# Patient Record
Sex: Male | Born: 1960 | Race: White | Hispanic: No | Marital: Married | Smoking: Never smoker
Health system: Southern US, Community
[De-identification: ages and names within clinical notes are randomized; demographics above are authoritative.]

## PROBLEM LIST (undated history)

## (undated) DIAGNOSIS — N183 Chronic kidney disease, stage 3 (moderate): Secondary | ICD-10-CM

## (undated) DIAGNOSIS — I255 Ischemic cardiomyopathy: Secondary | ICD-10-CM

## (undated) DIAGNOSIS — N184 Chronic kidney disease, stage 4 (severe): Secondary | ICD-10-CM

## (undated) DIAGNOSIS — E1140 Type 2 diabetes mellitus with diabetic neuropathy, unspecified: Secondary | ICD-10-CM

## (undated) DIAGNOSIS — I5042 Chronic combined systolic (congestive) and diastolic (congestive) heart failure: Secondary | ICD-10-CM

## (undated) DIAGNOSIS — M79672 Pain in left foot: Secondary | ICD-10-CM

## (undated) DIAGNOSIS — M25579 Pain in unspecified ankle and joints of unspecified foot: Secondary | ICD-10-CM

## (undated) DIAGNOSIS — E785 Hyperlipidemia, unspecified: Secondary | ICD-10-CM

## (undated) DIAGNOSIS — I2510 Atherosclerotic heart disease of native coronary artery without angina pectoris: Secondary | ICD-10-CM

## (undated) DIAGNOSIS — I10 Essential (primary) hypertension: Secondary | ICD-10-CM

## (undated) DIAGNOSIS — M79671 Pain in right foot: Secondary | ICD-10-CM

## (undated) HISTORY — DX: Pain in right foot: M79.671

## (undated) HISTORY — DX: Hyperlipidemia, unspecified: E78.5

## (undated) HISTORY — DX: Chronic combined systolic (congestive) and diastolic (congestive) heart failure: I50.42

## (undated) HISTORY — DX: Atherosclerotic heart disease of native coronary artery without angina pectoris: I25.10

## (undated) HISTORY — DX: Essential (primary) hypertension: I10

## (undated) HISTORY — DX: Pain in right foot: M79.672

## (undated) HISTORY — DX: Chronic kidney disease, stage 4 (severe): N18.4

## (undated) HISTORY — DX: Ischemic cardiomyopathy: I25.5

## (undated) HISTORY — PX: CARDIAC CATHETERIZATION: SHX172

## (undated) HISTORY — DX: Pain in unspecified ankle and joints of unspecified foot: M25.579

---

## 2000-07-15 HISTORY — PX: TIBIA FRACTURE SURGERY: SHX806

## 2005-08-09 ENCOUNTER — Emergency Department: Payer: Self-pay | Admitting: Internal Medicine

## 2005-08-09 ENCOUNTER — Other Ambulatory Visit: Payer: Self-pay

## 2007-09-20 DIAGNOSIS — I10 Essential (primary) hypertension: Secondary | ICD-10-CM | POA: Insufficient documentation

## 2012-01-14 DIAGNOSIS — M7030 Other bursitis of elbow, unspecified elbow: Secondary | ICD-10-CM | POA: Insufficient documentation

## 2012-05-18 DIAGNOSIS — F1010 Alcohol abuse, uncomplicated: Secondary | ICD-10-CM | POA: Insufficient documentation

## 2012-05-18 DIAGNOSIS — M19079 Primary osteoarthritis, unspecified ankle and foot: Secondary | ICD-10-CM | POA: Insufficient documentation

## 2013-08-27 DIAGNOSIS — E1142 Type 2 diabetes mellitus with diabetic polyneuropathy: Secondary | ICD-10-CM | POA: Insufficient documentation

## 2013-08-27 DIAGNOSIS — L97509 Non-pressure chronic ulcer of other part of unspecified foot with unspecified severity: Secondary | ICD-10-CM

## 2013-08-27 DIAGNOSIS — E11621 Type 2 diabetes mellitus with foot ulcer: Secondary | ICD-10-CM | POA: Insufficient documentation

## 2013-08-27 DIAGNOSIS — E1140 Type 2 diabetes mellitus with diabetic neuropathy, unspecified: Secondary | ICD-10-CM | POA: Insufficient documentation

## 2013-09-23 LAB — HM HIV SCREENING LAB: HM HIV Screening: NEGATIVE

## 2013-09-23 LAB — HM HEPATITIS C SCREENING LAB: HM Hepatitis Screen: NEGATIVE

## 2015-06-15 DIAGNOSIS — N184 Chronic kidney disease, stage 4 (severe): Secondary | ICD-10-CM | POA: Insufficient documentation

## 2015-06-15 DIAGNOSIS — N183 Chronic kidney disease, stage 3 unspecified: Secondary | ICD-10-CM | POA: Insufficient documentation

## 2015-08-08 ENCOUNTER — Ambulatory Visit: Payer: Self-pay | Admitting: Family Medicine

## 2015-08-23 ENCOUNTER — Ambulatory Visit: Payer: Self-pay | Admitting: Family Medicine

## 2015-08-28 ENCOUNTER — Encounter: Payer: Self-pay | Admitting: Family Medicine

## 2015-08-28 ENCOUNTER — Ambulatory Visit (INDEPENDENT_AMBULATORY_CARE_PROVIDER_SITE_OTHER): Payer: Medicaid Other | Admitting: Family Medicine

## 2015-08-28 VITALS — BP 160/98 | HR 91 | Temp 98.0°F | Resp 16 | Ht 68.0 in | Wt 194.0 lb

## 2015-08-28 DIAGNOSIS — M19072 Primary osteoarthritis, left ankle and foot: Secondary | ICD-10-CM | POA: Diagnosis not present

## 2015-08-28 DIAGNOSIS — N183 Chronic kidney disease, stage 3 unspecified: Secondary | ICD-10-CM

## 2015-08-28 DIAGNOSIS — I10 Essential (primary) hypertension: Secondary | ICD-10-CM

## 2015-08-28 DIAGNOSIS — E1142 Type 2 diabetes mellitus with diabetic polyneuropathy: Secondary | ICD-10-CM | POA: Diagnosis not present

## 2015-08-28 DIAGNOSIS — Z1211 Encounter for screening for malignant neoplasm of colon: Secondary | ICD-10-CM | POA: Diagnosis not present

## 2015-08-28 DIAGNOSIS — Z7189 Other specified counseling: Secondary | ICD-10-CM | POA: Diagnosis not present

## 2015-08-28 DIAGNOSIS — Z7689 Persons encountering health services in other specified circumstances: Secondary | ICD-10-CM

## 2015-08-28 MED ORDER — LISINOPRIL-HYDROCHLOROTHIAZIDE 20-12.5 MG PO TABS: 1.0000 | ORAL_TABLET | Freq: Every day | ORAL | Status: DC

## 2015-08-28 MED ORDER — ACETAMINOPHEN ER 650 MG PO TBCR: 650.0000 mg | EXTENDED_RELEASE_TABLET | Freq: Three times a day (TID) | ORAL | Status: AC | PRN

## 2015-08-28 MED ORDER — GABAPENTIN 300 MG PO CAPS
ORAL_CAPSULE | ORAL | Status: DC
Start: 2015-08-28 — End: 2015-11-17

## 2015-08-28 NOTE — Assessment & Plan Note (Signed)
Uncontrolled. Increase lisinopril to 20mg  daily. Continue amlodipine. Check CMET for kidney function.  Return in 2 weeks.

## 2015-08-28 NOTE — Assessment & Plan Note (Signed)
Check CMET. ACE for renal protection.

## 2015-08-28 NOTE — Patient Instructions (Signed)
BP: Take 1 tablet of lisinopril daily. We will recheck your kidney functions. Please check BP at home.  Diabetes: Keep medications the same.   Foot pain: Take 2 capsules of gabapentin in the AM and 3 capsules in the PM.  Toe Fungus: Try 50% listerine and 50% vinegar mixture. If that doesn't help, we will have you see a podiatrist.   Your goal blood pressure is 140/90 Work on low salt/sodium diet - goal <1.5gm (1,500mg ) per day. Eat a diet high in fruits/vegetables and whole grains.  Look into mediterranean and DASH diet. Goal activity is 184min/wk of moderate intensity exercise.  This can be split into 30 minute chunks.  If you are not at this level, you can start with smaller 10-15 min increments and slowly build up activity. Look at [employer].org for more resources  Please seek immediate medical attention at ER or Urgent Care if you develop: Chest pain, pressure or tightness. Shortness of breath accompanied by nausea or diaphoresis Visual changes Numbness or tingling on one side of the body Facial droop Altered mental status Or any concerning symptoms.

## 2015-08-28 NOTE — Assessment & Plan Note (Signed)
No changes to diabetes regimen. A1c due after March 1. ACE for renal protection. Increase gabapentin to 3 pills in the evening and 2 in AM for uncontrolled neuropathy.  Foot exam done today. Referred for diabetic eye exam. A1C due after march 1.

## 2015-08-28 NOTE — Assessment & Plan Note (Signed)
Recommend PRN tylenol for pain. Sparing use of aleve for pain.

## 2015-08-28 NOTE — Progress Notes (Signed)
Subjective:    Patient ID: [name], male    DOB: September 06, 1960, 55 y.o.   MRN: [id number]  HPI: [name] is a 55 y.o. male presenting on 08/28/2015 for Establish Care   HPI  Pt presents to establish care today. Previous care provider was [place]  It has been 1 month since his last PCP visit. Records from previous provider will be requested and reviewed. Current medical problems include:  Diabetes: Diagnosed several years ago. Taking 30 units of lantus nightly. [facility] BID[facility] Numbness and tingling in feet. Taking 600 gabapentin BID. Checks BG- avg 150. No visual changes, spots, or floaters. LAST a1C 06/14/2015 was 7.1% Hypertension: Diagnosed with DM. Taking 10mg  lisinopril daily- previously taking 20mg  but stopped due to kidney function by previous PCP. Taking 10mg  amlodipine daily. Took medications this morning. Tries to avoid salt. Does have an occasional beer. No chest pain or shortness of breath.  Hyperlipidemia: Diagnosed with DM. Taking pravachol once daily. No myalgias.  Arthritis: Hands, knees, ankles. Pain with overuse. Takes no medication on a regular basis.    Health maintenance:  TDAP in 2008.  No colonscopy- desires Cologuard.    Past Medical History  Diagnosis Date  . Hypertension   . Diabetes mellitus without complication ([place])   . Ankle pain   . Pain in both feet   . Hyperlipidemia    Social History   Social History  . Marital Status: Married    Spouse Name: N/A  . Number of Children: N/A  . Years of Education: N/A   Occupational History  . Not on file.   Social History Main Topics  . Smoking status: Never Smoker   . Smokeless tobacco: Not on file  . Alcohol Use: Yes  . Drug Use: No  . Sexual Activity: Not on file   Other Topics Concern  . Not on file   Social History Narrative  . No narrative on file   Family History  Problem Relation Age of Onset  . Hypertension Mother   . Heart  disease Maternal Grandfather    No current outpatient prescriptions on file prior to visit.   No current facility-administered medications on file prior to visit.    Review of Systems  Constitutional: Negative for fever and chills.  HENT: Negative.   Respiratory: Negative for chest tightness, shortness of breath and wheezing.   Cardiovascular: Negative for chest pain, palpitations and leg swelling.  Gastrointestinal: Negative for nausea, vomiting and abdominal pain.  Endocrine: Negative.   Genitourinary: Negative for dysuria, urgency, discharge, penile pain and testicular pain.  Musculoskeletal: Positive for arthralgias (knees, hands, ankles). Negative for back pain and joint swelling.  Skin: Negative.   Neurological: Positive for numbness (bilateral feet). Negative for dizziness, weakness and headaches.  Psychiatric/Behavioral: Negative for sleep disturbance and dysphoric mood.   Per HPI unless specifically indicated above     Objective:    BP 160/98 mmHg  Pulse 91  Temp(Src) 98 F (36.7 C) (Oral)  Resp 16  Ht 5\' 8"  (1.727 m)  Wt 194 lb (87.998 kg)  BMI 29.50 kg/m2  Wt Readings from Last 3 Encounters:  08/28/15 194 lb (87.998 kg)    Physical Exam  Constitutional: He is oriented to person, place, and time. He appears well-developed and well-nourished. No distress.  HENT:  Head: Normocephalic and atraumatic.  Neck: Neck supple. No thyromegaly present.  Cardiovascular: Normal rate, regular rhythm and normal heart  sounds.  Exam reveals no gallop and no friction rub.   No murmur heard. Pulmonary/Chest: Effort normal and breath sounds normal. He has no wheezes.  Abdominal: Soft. Bowel sounds are normal. He exhibits no distension. There is no tenderness. There is no rebound.  Musculoskeletal: Normal range of motion. He exhibits no edema or tenderness.  Neurological: He is alert and oriented to person, place, and time. He has normal reflexes.  Skin: Skin is warm and dry. No  rash noted. No erythema.  Psychiatric: He has a normal mood and affect. His behavior is normal. Thought content normal.   Diabetic Foot Exam - Simple   Simple Foot Form  Diabetic Foot exam was performed with the following findings:  Yes 08/28/2015  9:41 AM  Visual Inspection  See comments:  Yes  Sensation Testing  Intact to touch and monofilament testing bilaterally:  Yes  Pulse Check  Posterior Tibialis and Dorsalis pulse intact bilaterally:  Yes  Comments  Onychomycosis All toes.       No results found for this or any previous visit.    Assessment & Plan:   Problem List Items Addressed This Visit      Cardiovascular and Mediastinum   Essential (primary) hypertension    Uncontrolled. Increase lisinopril to 20mg  daily. Continue amlodipine. Check CMET for kidney function.  Return in 2 weeks.       Relevant Medications   amLODipine (NORVASC) 10 MG tablet   aspirin EC 81 MG tablet   lisinopril (PRINIVIL,ZESTRIL) 20 MG tablet   pravastatin (PRAVACHOL) 40 MG tablet   lisinopril-hydrochlorothiazide (PRINZIDE,ZESTORETIC) 20-12.5 MG tablet     Nervous and Auditory   DM type 2 with diabetic peripheral neuropathy (HCC) - Primary    No changes to diabetes regimen. A1c due after March 1. ACE for renal protection. Increase gabapentin to 3 pills in the evening and 2 in AM for uncontrolled neuropathy.  Foot exam done today. Referred for diabetic eye exam. A1C due after march 1.       Relevant Medications   aspirin EC 81 MG tablet   lisinopril (PRINIVIL,ZESTRIL) 20 MG tablet   metFORMIN (GLUCOPHAGE) 1000 MG tablet   pravastatin (PRAVACHOL) 40 MG tablet   LANTUS SOLOSTAR 100 UNIT/ML Solostar Pen   lisinopril-hydrochlorothiazide (PRINZIDE,ZESTORETIC) 20-12.5 MG tablet   gabapentin (NEURONTIN) 300 MG capsule   Other Relevant Orders   Ambulatory referral to Ophthalmology   Comprehensive metabolic panel   Lipid panel     Musculoskeletal and Integument   Osteoarthritis of ankle  and foot    Recommend PRN tylenol for pain. Sparing use of aleve for pain.       Relevant Medications   aspirin EC 81 MG tablet   acetaminophen (TYLENOL 8 HOUR) 650 MG CR tablet     Genitourinary   Chronic kidney disease (CKD), stage III (moderate)    Check CMET. ACE for renal protection.        Other Visit Diagnoses    Screening for colon cancer        Relevant Orders    Cologuard       Meds ordered this encounter  Medications  . amLODipine (NORVASC) 10 MG tablet    Sig: Take 10 mg by mouth.  [name] aspirin EC 81 MG tablet    Sig: Take 81 mg by mouth.  . DISCONTD: gabapentin (NEURONTIN) 300 MG capsule    Sig: Take 600 mg by mouth.  [name] lisinopril (PRINIVIL,ZESTRIL) 20 MG tablet    Sig: Take  10 mg by mouth.  . metFORMIN (GLUCOPHAGE) 1000 MG tablet    Sig: Take 1,000 mg by mouth.  . Insulin Pen Needle (PEN NEEDLES 31GX5/16") 31G X 8 MM MISC    Sig:   . pravastatin (PRAVACHOL) 40 MG tablet    Sig: Take 40 mg by mouth.  [name] LANTUS SOLOSTAR 100 UNIT/ML Solostar Pen    Sig: INJECT 30 UNITS UNDER THE SKIN NIGHTLY    Refill:  12  . Iron-Vitamins (GERITOL PO)    Sig: Take 1 tablet by mouth. Pt takes occasionally  . lisinopril-hydrochlorothiazide (PRINZIDE,ZESTORETIC) 20-12.5 MG tablet    Sig: Take 1 tablet by mouth daily.    Dispense:  30 tablet    Refill:  11    Order Specific Question:  Supervising Provider    Answer:  Arlis Porta [phone]  . gabapentin (NEURONTIN) 300 MG capsule    Sig: Take 2 capsules in the AM and 3 capsules in the PM.    Dispense:  150 capsule    Refill:  11    Order Specific Question:  Supervising Provider    Answer:  Arlis Porta [phone]  . acetaminophen (TYLENOL 8 HOUR) 650 MG CR tablet    Sig: Take 1 tablet (650 mg total) by mouth every 8 (eight) hours as needed for pain.    Order Specific Question:  Supervising Provider    Answer:  Arlis Porta [id number]      Follow up plan: Return in about 2 weeks (around 09/13/2015) for  Diabetes, HTN.[name]

## 2015-08-29 LAB — LIPID PANEL
CHOLESTEROL TOTAL: 219 mg/dL — AB (ref 100–199)
Chol/HDL Ratio: 4.2 ratio units (ref 0.0–5.0)
HDL: 52 mg/dL (ref >39)
LDL CALC: 107 mg/dL — AB (ref 0–99)
Triglycerides: 302 mg/dL — ABNORMAL HIGH (ref 0–149)
VLDL Cholesterol Cal: 60 mg/dL — ABNORMAL HIGH (ref 5–40)

## 2015-08-29 LAB — COMPREHENSIVE METABOLIC PANEL
ALBUMIN: 4.4 g/dL (ref 3.5–5.5)
ALK PHOS: 64 IU/L (ref 39–117)
ALT: 32 IU/L (ref 0–44)
AST: 23 IU/L (ref 0–40)
Albumin/Globulin Ratio: 1.6 (ref 1.1–2.5)
BILIRUBIN TOTAL: 0.4 mg/dL (ref 0.0–1.2)
BUN / CREAT RATIO: 15 (ref 9–20)
BUN: 22 mg/dL (ref 6–24)
CHLORIDE: 96 mmol/L (ref 96–106)
CO2: 21 mmol/L (ref 18–29)
Calcium: 9.1 mg/dL (ref 8.7–10.2)
Creatinine, Ser: 1.42 mg/dL — ABNORMAL HIGH (ref 0.76–1.27)
GFR calc Af Amer: 64 mL/min/{1.73_m2} (ref >59)
GFR calc non Af Amer: 55 mL/min/{1.73_m2} — ABNORMAL LOW (ref >59)
GLOBULIN, TOTAL: 2.8 g/dL (ref 1.5–4.5)
Glucose: 144 mg/dL — ABNORMAL HIGH (ref 65–99)
Potassium: 4.8 mmol/L (ref 3.5–5.2)
SODIUM: 135 mmol/L (ref 134–144)
Total Protein: 7.2 g/dL (ref 6.0–8.5)

## 2015-09-14 ENCOUNTER — Ambulatory Visit (INDEPENDENT_AMBULATORY_CARE_PROVIDER_SITE_OTHER): Payer: Medicaid Other | Admitting: Family Medicine

## 2015-09-14 ENCOUNTER — Encounter: Payer: Self-pay | Admitting: Family Medicine

## 2015-09-14 DIAGNOSIS — N183 Chronic kidney disease, stage 3 unspecified: Secondary | ICD-10-CM

## 2015-09-14 DIAGNOSIS — I10 Essential (primary) hypertension: Secondary | ICD-10-CM | POA: Diagnosis not present

## 2015-09-14 DIAGNOSIS — E1142 Type 2 diabetes mellitus with diabetic polyneuropathy: Secondary | ICD-10-CM

## 2015-09-14 LAB — POCT GLYCOSYLATED HEMOGLOBIN (HGB A1C): Hemoglobin A1C: 7.4

## 2015-09-14 MED ORDER — PRAVASTATIN SODIUM 80 MG PO TABS: 80.0000 mg | ORAL_TABLET | Freq: Every day | ORAL | Status: DC

## 2015-09-14 MED ORDER — CARVEDILOL 6.25 MG PO TABS: 6.2500 mg | ORAL_TABLET | Freq: Two times a day (BID) | ORAL | Status: DC

## 2015-09-14 MED ORDER — LANTUS SOLOSTAR 100 UNIT/ML ~~LOC~~ SOPN: PEN_INJECTOR | SUBCUTANEOUS | Status: DC

## 2015-09-14 MED ORDER — LISINOPRIL 20 MG PO TABS: 20.0000 mg | ORAL_TABLET | Freq: Every day | ORAL | Status: DC

## 2015-09-14 NOTE — Assessment & Plan Note (Signed)
A1c is elevated from previous. Titrate Lantus 2 units per week for FBG > 130. Reviewed A1c goals with patient. Consider adding secondary oral agent or mealtime insulin with continued A1c increase.  Continue gabapentin for neuropathy.

## 2015-09-14 NOTE — Assessment & Plan Note (Signed)
Elevated today. Remove HCTZ due to elevated Cr and reduced renal function. Lisinopril 20mg  daily. Add coreg 6.25mg  BID to help with BP. Encouraged DASH diet and checking BP at home.  Recheck 1 mos.

## 2015-09-14 NOTE — Patient Instructions (Addendum)
Please check your blood glucose once  times daily. If your glucose is < 70 mg/dl or you have symptoms of hypoglycemia dizziness, hunger, jitteriness and sweating please drink 4 oz of juice or soda.  Check blood glucose 15 minutes later. If it has not risen to >100, please seek medical attention. If > 100 please eat a snack containing protein such as peanut butter and crackers.  Check blood glucose once daily in the AM: Add 2 units of insulin per week until AM blood sugars are less than 130.    STOP lisinopril HCTZ and just take lisinopril 20mg  daily. Start Coreg 6.25 mg twice daily for blood pressure.  Your goal blood pressure is 140/90 Work on low salt/sodium diet - goal <1.5gm (1,500mg ) per day. Eat a diet high in fruits/vegetables and whole grains.  Look into mediterranean and DASH diet. Goal activity is 117min/wk of moderate intensity exercise.  This can be split into 30 minute chunks.  If you are not at this level, you can start with smaller 10-15 min increments and slowly build up activity. Look at [employer].org for more resources  Please seek immediate medical attention at ER or Urgent Care if you develop: Chest pain, pressure or tightness. Shortness of breath accompanied by nausea or diaphoresis Visual changes Numbness or tingling on one side of the body Facial droop Altered mental status Or any concerning symptoms.

## 2015-09-14 NOTE — Progress Notes (Signed)
Subjective:    Patient ID: [name], male    DOB: 09-16-1960, 55 y.o.   MRN: [id number]  HPI: [name] is a 55 y.o. male presenting on 09/14/2015 for Hyperlipidemia   HPI   Here for follow up on BP and DM.  BP - Takes amlodipine 10 and lisinopril-HCTZ 20/12.5, both daily in the morning. No HA's, CP, SOB, acute vision changes, dizziness, or lightheadedness. Does not check BP at home. Last eye doctor appointment was 2 years ago. Has some near vision problems that are chronic he says. He did take medications today.  DM - checks blood sugar twice per week in the morning before eating. Gets 150-160's. A1C 7.4. Takes Lantus 30 at bedtime and metformin BID. No numbness and tingling in his feet.   monofilament - decreased sensation plantar area.  Past Medical History  Diagnosis Date  . Hypertension   . Diabetes mellitus without complication ([place])   . Ankle pain   . Pain in both feet   . Hyperlipidemia     Current Outpatient Prescriptions on File Prior to Visit  Medication Sig  . acetaminophen (TYLENOL 8 HOUR) 650 MG CR tablet Take 1 tablet (650 mg total) by mouth every 8 (eight) hours as needed for pain.  [name] amLODipine (NORVASC) 10 MG tablet Take 10 mg by mouth.  [name] aspirin EC 81 MG tablet Take 81 mg by mouth.  . gabapentin (NEURONTIN) 300 MG capsule Take 2 capsules in the AM and 3 capsules in the PM.  . Insulin Pen Needle (PEN NEEDLES 31GX5/16") 31G X 8 MM MISC   . Iron-Vitamins (GERITOL PO) Take 1 tablet by mouth. Pt takes occasionally  . metFORMIN (GLUCOPHAGE) 1000 MG tablet Take 1,000 mg by mouth.   No current facility-administered medications on file prior to visit.    Review of Systems  Constitutional: Negative for fever, chills, diaphoresis, activity change, appetite change and fatigue.  HENT: Negative for congestion, ear discharge, ear pain, hearing loss, postnasal drip, rhinorrhea, sinus pressure, sore throat and trouble swallowing.   Eyes: Negative for  photophobia, pain, discharge and visual disturbance.  Respiratory: Negative for cough, chest tightness and shortness of breath.   Cardiovascular: Negative for chest pain and palpitations.  Gastrointestinal: Negative for nausea, vomiting, abdominal pain and diarrhea.  Genitourinary: Negative for dysuria, frequency, decreased urine volume and difficulty urinating.  Musculoskeletal: Negative for myalgias, back pain, joint swelling, arthralgias and gait problem.  Skin: Negative for color change.  Neurological: Negative for dizziness, weakness, light-headedness and headaches.  Psychiatric/Behavioral: Negative for behavioral problems, sleep disturbance and agitation. The patient is not nervous/anxious.    Per HPI unless specifically indicated above     Objective:    BP 158/76 mmHg  Pulse 77  Temp(Src) 97.8 F (36.6 C) (Oral)  Resp 16  Ht 5\' 8"  (1.727 m)  Wt 195 lb (88.451 kg)  BMI 29.66 kg/m2  Wt Readings from Last 3 Encounters:  09/14/15 195 lb (88.451 kg)  08/28/15 194 lb (87.998 kg)    Physical Exam  Constitutional: He is oriented to person, place, and time. He appears well-developed and well-nourished. No distress.  HENT:  Head: Normocephalic.  Eyes: Conjunctivae and lids are normal.  Fundoscopic exam:      The right eye shows red reflex.       The left eye shows red reflex.  Neck: Normal range of motion. Neck supple. Carotid bruit is not present. No thyromegaly present.  Cardiovascular: Normal rate, regular rhythm, normal  heart sounds and intact distal pulses.  Exam reveals no gallop.   No murmur heard. Pulmonary/Chest: Effort normal and breath sounds normal. No respiratory distress. He has no wheezes.  Abdominal: Soft. Bowel sounds are normal. There is no tenderness. There is no rebound.  Musculoskeletal: Normal range of motion. He exhibits no edema or tenderness.  Lymphadenopathy:    He has no cervical adenopathy.  Neurological: He is alert and oriented to person, place,  and time.  Skin: Skin is warm and dry. He is not diaphoretic.  Psychiatric: He has a normal mood and affect. His behavior is normal. Thought content normal.   Diabetic Foot Exam - Simple   Simple Foot Form  Diabetic Foot exam was performed with the following findings:  Yes 09/14/2015  9:03 AM  Visual Inspection  No deformities, no ulcerations, no other skin breakdown bilaterally:  Yes  Sensation Testing  See comments:  Yes  Pulse Check  Posterior Tibialis and Dorsalis pulse intact bilaterally:  Yes  Comments  Decreased sensation to monofilament on plantar surface.       Results for orders placed or performed in visit on 09/14/15  POCT HgB A1C  Result Value Ref Range   Hemoglobin A1C 7.4       Assessment & Plan:   Problem List Items Addressed This Visit      Cardiovascular and Mediastinum   Essential (primary) hypertension    Elevated today. Remove HCTZ due to elevated Cr and reduced renal function. Lisinopril 20mg  daily. Add coreg 6.25mg  BID to help with BP. Encouraged DASH diet and checking BP at home.  Recheck 1 mos.       Relevant Medications   lisinopril (PRINIVIL,ZESTRIL) 20 MG tablet   carvedilol (COREG) 6.25 MG tablet   pravastatin (PRAVACHOL) 80 MG tablet     Nervous and Auditory   DM type 2 with diabetic peripheral neuropathy (HCC)    A1c is elevated from previous. Titrate Lantus 2 units per week for FBG > 130. Reviewed A1c goals with patient. Consider adding secondary oral agent or mealtime insulin with continued A1c increase.  Continue gabapentin for neuropathy.       Relevant Medications   LANTUS SOLOSTAR 100 UNIT/ML Solostar Pen   lisinopril (PRINIVIL,ZESTRIL) 20 MG tablet   pravastatin (PRAVACHOL) 80 MG tablet   Other Relevant Orders   POCT HgB A1C (Completed)     Genitourinary   Chronic kidney disease (CKD), stage III (moderate)    Stop HCTZ due to Cr bump. Check BMP next visit.          Meds ordered this encounter  Medications  . LANTUS  SOLOSTAR 100 UNIT/ML Solostar Pen    Sig: Inject 32 units under the skin bedtime daily. Increase by 2 units per week until fasting blood sugars are <130.    Dispense:  15 mL    Refill:  12    Order Specific Question:  Supervising Provider    Answer:  Arlis Porta [phone]  . lisinopril (PRINIVIL,ZESTRIL) 20 MG tablet    Sig: Take 1 tablet (20 mg total) by mouth daily.    Dispense:  90 tablet    Refill:  3    Order Specific Question:  Supervising Provider    Answer:  Arlis Porta [phone]  . carvedilol (COREG) 6.25 MG tablet    Sig: Take 1 tablet (6.25 mg total) by mouth 2 (two) times daily with a meal.    Dispense:  60 tablet  Refill:  3    Order Specific Question:  Supervising Provider    Answer:  Arlis Porta [id number]  . pravastatin (PRAVACHOL) 80 MG tablet    Sig: Take 1 tablet (80 mg total) by mouth daily.    Dispense:  90 tablet    Refill:  3    Order Specific Question:  Supervising Provider    Answer:  Arlis Porta [id number]      Follow up plan: Return in about 4 weeks (around 10/12/2015) for blood sugar check, htn.

## 2015-09-14 NOTE — Assessment & Plan Note (Signed)
Stop HCTZ due to Cr bump. Check BMP next visit.

## 2015-10-20 ENCOUNTER — Ambulatory Visit (INDEPENDENT_AMBULATORY_CARE_PROVIDER_SITE_OTHER): Payer: Medicaid Other | Admitting: Family Medicine

## 2015-10-20 ENCOUNTER — Encounter: Payer: Self-pay | Admitting: Family Medicine

## 2015-10-20 VITALS — BP 156/88 | HR 77 | Temp 98.0°F | Resp 16 | Ht 68.0 in | Wt 205.8 lb

## 2015-10-20 DIAGNOSIS — I10 Essential (primary) hypertension: Secondary | ICD-10-CM

## 2015-10-20 DIAGNOSIS — E1142 Type 2 diabetes mellitus with diabetic polyneuropathy: Secondary | ICD-10-CM | POA: Diagnosis not present

## 2015-10-20 DIAGNOSIS — N183 Chronic kidney disease, stage 3 unspecified: Secondary | ICD-10-CM

## 2015-10-20 LAB — POCT UA - MICROALBUMIN
Albumin/Creatinine Ratio, Urine, POC: 0
Creatinine, POC: 0 mg/dL
Microalbumin Ur, POC: 100 mg/L

## 2015-10-20 MED ORDER — CARVEDILOL 12.5 MG PO TABS: 12.5000 mg | ORAL_TABLET | Freq: Two times a day (BID) | ORAL | Status: DC

## 2015-10-20 NOTE — Assessment & Plan Note (Signed)
Elevated today. Pending kidney function we will increase carvedilol to 12.5mg  BID. Encouraged pt to check at home. Alarm symptoms reviewed.  Recheck 1 mos.

## 2015-10-20 NOTE — Patient Instructions (Signed)
Your goal blood pressure is 140/90. Work on low salt/sodium diet - goal <1.5gm (1,500mg ) per day. Eat a diet high in fruits/vegetables and whole grains.  Look into mediterranean and DASH diet. Goal activity is 176min/wk of moderate intensity exercise.  This can be split into 30 minute chunks.  If you are not at this level, you can start with smaller 10-15 min increments and slowly build up activity. Look at [employer].org for more resources  Carvedilol can sometimes cause your HR to be low. If you feel dizzy- count your pulse if less than <65- DO not take your second dose of carvedilol.

## 2015-10-20 NOTE — Assessment & Plan Note (Signed)
Recheck BMP to determine kidney function. Avoid NSAIDS.

## 2015-10-20 NOTE — Progress Notes (Signed)
Subjective:    Patient ID: [name], male    DOB: 04-05-1961, 55 y.o.   MRN: [id number]  HPI: [name] is a 55 y.o. male presenting on 10/20/2015 for Diabetes   HPI  Pt presents for recheck of diabetes and HTN.  Diabetes: Titration lantus- now at 34 units daily.  Sugar this AM was 99. Avg sugars were 130 in the AM. Only checks fasting sugars in the AM. No hypoglycemia. HTN: Started carvedilol at last visit. Taking twice daily. In addition to lisinopril 20mg  daily, amlodipine 10mg  daily. Does not check BP at home. No CP, SOB, dizziness, or HA.  He does have CKD with elevated Cr. We were changing his BP medications based on his Cr.   Past Medical History  Diagnosis Date  . Hypertension   . Diabetes mellitus without complication ([place])   . Ankle pain   . Pain in both feet   . Hyperlipidemia     Current Outpatient Prescriptions on File Prior to Visit  Medication Sig  . acetaminophen (TYLENOL 8 HOUR) 650 MG CR tablet Take 1 tablet (650 mg total) by mouth every 8 (eight) hours as needed for pain.  [name] amLODipine (NORVASC) 10 MG tablet Take 10 mg by mouth.  [name] aspirin EC 81 MG tablet Take 81 mg by mouth.  . gabapentin (NEURONTIN) 300 MG capsule Take 2 capsules in the AM and 3 capsules in the PM.  . Insulin Pen Needle (PEN NEEDLES 31GX5/16") 31G X 8 MM MISC   . Iron-Vitamins (GERITOL PO) Take 1 tablet by mouth. Pt takes occasionally  . LANTUS SOLOSTAR 100 UNIT/ML Solostar Pen Inject 32 units under the skin bedtime daily. Increase by 2 units per week until fasting blood sugars are <130.  [name] lisinopril (PRINIVIL,ZESTRIL) 20 MG tablet Take 1 tablet (20 mg total) by mouth daily.  . metFORMIN (GLUCOPHAGE) 1000 MG tablet Take 1,000 mg by mouth.  . pravastatin (PRAVACHOL) 80 MG tablet Take 1 tablet (80 mg total) by mouth daily.   No current facility-administered medications on file prior to visit.    Review of Systems  Constitutional: Negative for fever and chills.  HENT:  Negative.   Respiratory: Negative for chest tightness, shortness of breath and wheezing.   Cardiovascular: Negative for chest pain, palpitations and leg swelling.  Gastrointestinal: Negative for nausea, vomiting and abdominal pain.  Endocrine: Negative.   Genitourinary: Negative for dysuria, urgency, discharge, penile pain and testicular pain.  Musculoskeletal: Negative for back pain, joint swelling and arthralgias.  Skin: Negative.   Neurological: Positive for numbness (paresthesias and numbness in feet due to DM). Negative for dizziness, weakness and headaches.  Psychiatric/Behavioral: Negative for sleep disturbance and dysphoric mood.   Per HPI unless specifically indicated above     Objective:    BP 156/88 mmHg  Pulse 77  Temp(Src) 98 F (36.7 C) (Oral)  Resp 16  Ht 5\' 8"  (1.727 m)  Wt 205 lb 12.8 oz (93.35 kg)  BMI 31.30 kg/m2  Wt Readings from Last 3 Encounters:  10/20/15 205 lb 12.8 oz (93.35 kg)  09/14/15 195 lb (88.451 kg)  08/28/15 194 lb (87.998 kg)    Physical Exam  Constitutional: He is oriented to person, place, and time. He appears well-developed and well-nourished. No distress.  HENT:  Head: Normocephalic and atraumatic.  Neck: Neck supple. No thyromegaly present.  Cardiovascular: Normal rate, regular rhythm and normal heart sounds.  Exam reveals no gallop and no friction rub.   No murmur  heard. Pulmonary/Chest: Effort normal and breath sounds normal. He has no wheezes.  Abdominal: Soft. Bowel sounds are normal. He exhibits no distension. There is no tenderness. There is no rebound.  Musculoskeletal: Normal range of motion. He exhibits no edema or tenderness.  Neurological: He is alert and oriented to person, place, and time. He has normal reflexes.  Skin: Skin is warm and dry. No rash noted. No erythema.  Psychiatric: He has a normal mood and affect. His behavior is normal. Thought content normal.   Results for orders placed or performed in visit on  09/14/15  POCT HgB A1C  Result Value Ref Range   Hemoglobin A1C 7.4       Assessment & Plan:   Problem List Items Addressed This Visit      Cardiovascular and Mediastinum   Essential (primary) hypertension    Elevated today. Pending kidney function we will increase carvedilol to 12.5mg  BID. Encouraged pt to check at home. Alarm symptoms reviewed.  Recheck 1 mos.       Relevant Medications   carvedilol (COREG) 12.5 MG tablet     Nervous and Auditory   DM type 2 with diabetic peripheral neuropathy (HCC)    Doing well titrating insulin. Sugars are well controlled. Encouraged pt to increase to 36 units if AM sugar is > 125 on average in 1 week.  UA micro done today. Foot exam UTD. Eye exam UTD.       Relevant Orders   POCT UA - Microalbumin     Genitourinary   Chronic kidney disease (CKD), stage III (moderate) - Primary    Recheck BMP to determine kidney function. Avoid NSAIDS.       Relevant Orders   Basic Metabolic Panel (BMET)      Meds ordered this encounter  Medications  . carvedilol (COREG) 12.5 MG tablet    Sig: Take 1 tablet (12.5 mg total) by mouth 2 (two) times daily with a meal.    Dispense:  60 tablet    Refill:  3    Order Specific Question:  Supervising Provider    Answer:  Arlis Porta [id number]      Follow up plan: Return in about 4 weeks (around 11/17/2015) for BP check. [name]

## 2015-10-20 NOTE — Assessment & Plan Note (Signed)
Doing well titrating insulin. Sugars are well controlled. Encouraged pt to increase to 36 units if AM sugar is > 125 on average in 1 week.  UA micro done today. Foot exam UTD. Eye exam UTD.

## 2015-10-27 LAB — BASIC METABOLIC PANEL
BUN/Creatinine Ratio: 12 (ref 9–20)
BUN: 13 mg/dL (ref 6–24)
CALCIUM: 9.3 mg/dL (ref 8.7–10.2)
CHLORIDE: 97 mmol/L (ref 96–106)
CO2: 24 mmol/L (ref 18–29)
Creatinine, Ser: 1.12 mg/dL (ref 0.76–1.27)
GFR calc Af Amer: 84 mL/min/{1.73_m2} (ref >59)
GFR calc non Af Amer: 73 mL/min/{1.73_m2} (ref >59)
GLUCOSE: 165 mg/dL — AB (ref 65–99)
POTASSIUM: 4.3 mmol/L (ref 3.5–5.2)
SODIUM: 137 mmol/L (ref 134–144)

## 2015-11-03 LAB — HM DIABETES EYE EXAM

## 2015-11-08 ENCOUNTER — Encounter: Payer: Self-pay | Admitting: Family Medicine

## 2015-11-17 ENCOUNTER — Ambulatory Visit (INDEPENDENT_AMBULATORY_CARE_PROVIDER_SITE_OTHER): Payer: Medicaid Other | Admitting: Family Medicine

## 2015-11-17 VITALS — BP 146/78 | HR 66 | Temp 98.0°F | Resp 16 | Ht 68.0 in | Wt 202.6 lb

## 2015-11-17 DIAGNOSIS — I10 Essential (primary) hypertension: Secondary | ICD-10-CM

## 2015-11-17 DIAGNOSIS — E1142 Type 2 diabetes mellitus with diabetic polyneuropathy: Secondary | ICD-10-CM

## 2015-11-17 DIAGNOSIS — M79674 Pain in right toe(s): Secondary | ICD-10-CM

## 2015-11-17 DIAGNOSIS — M79675 Pain in left toe(s): Secondary | ICD-10-CM

## 2015-11-17 MED ORDER — GABAPENTIN 300 MG PO CAPS: ORAL_CAPSULE | ORAL | Status: DC

## 2015-11-17 MED ORDER — NAPROXEN 375 MG PO TABS: 375.0000 mg | ORAL_TABLET | Freq: Two times a day (BID) | ORAL | Status: DC

## 2015-11-17 MED ORDER — LISINOPRIL 40 MG PO TABS: 40.0000 mg | ORAL_TABLET | Freq: Every day | ORAL | Status: DC

## 2015-11-17 MED ORDER — CAPSAICIN 0.025 % EX CREA: TOPICAL_CREAM | Freq: Two times a day (BID) | CUTANEOUS | Status: DC

## 2015-11-17 NOTE — Assessment & Plan Note (Signed)
Increase lisinopril to 40mg  daily. Continue other medications. Consider adding low dose diuretic now that kidney function is back to normal. Encouraged Dash diet.  Recheck 1 mos.

## 2015-11-17 NOTE — Patient Instructions (Signed)
Blood pressure: Increase to lisinopril 40mg  daily. You can take 2 pills of the 20mg  tablets until the bottle is empty.   Your goal blood pressure is 140/90. Work on low salt/sodium diet - goal <1.5gm (1,500mg ) per day. Eat a diet high in fruits/vegetables and whole grains.  Look into mediterranean and DASH diet. Goal activity is 162min/wk of moderate intensity exercise.  This can be split into 30 minute chunks.  If you are not at this level, you can start with smaller 10-15 min increments and slowly build up activity. Look at [email or web address] for more resources   Foot pain: I think it is related to the nerve pain you have from diabetes. We will increase your Gabapentin to 3 capsules in the AM and 3 capsules in the PM. Try adding capsaicin cream applied to feet twice daily. We will also try a little bit of naproxen twice daily for 10 days to see if that helps.   Please seek immediate medical attention at ER or Urgent Care if you develop: Chest pain, pressure or tightness. Shortness of breath accompanied by nausea or diaphoresis Visual changes Numbness or tingling on one side of the body Facial droop Altered mental status Or any concerning symptoms.

## 2015-11-17 NOTE — Assessment & Plan Note (Signed)
Neuropathy vs PVD or mixture of both as cause of pain. Trial of increasing gabapentin. Adding topical capsaicin cream twice daily for pain. Consider adding cymbalta for further pain control if not successful.

## 2015-11-17 NOTE — Progress Notes (Signed)
Subjective:    Patient ID: [name], male    DOB: 01/29/1961, 55 y.o.   MRN: [id number]  HPI: [name] is a 55 y.o. male presenting on 11/17/2015 for Hypertension   HPI  Pt presents for blood pressure follow-up. BP is elevated today. Has been elevated at home. Reports he is pain due to his toes. Toes are red and tingling. Gabapentin is not helping with toe pain. Hurt worse at night.  BP: Carevedilol BID, amlodipine, and lisinopril 20mg . Checking at home. Still high. No HA. No CP. No SOB.  Pt recently had his diabetic eye exam. No retinopathy noted.   Past Medical History  Diagnosis Date  . Hypertension   . Diabetes mellitus without complication ([place])   . Ankle pain   . Pain in both feet   . Hyperlipidemia     Current Outpatient Prescriptions on File Prior to Visit  Medication Sig  . acetaminophen (TYLENOL 8 HOUR) 650 MG CR tablet Take 1 tablet (650 mg total) by mouth every 8 (eight) hours as needed for pain.  [name] amLODipine (NORVASC) 10 MG tablet Take 10 mg by mouth.  [name] aspirin EC 81 MG tablet Take 81 mg by mouth.  . carvedilol (COREG) 12.5 MG tablet Take 1 tablet (12.5 mg total) by mouth 2 (two) times daily with a meal.  . Insulin Pen Needle (PEN NEEDLES 31GX5/16") 31G X 8 MM MISC   . Iron-Vitamins (GERITOL PO) Take 1 tablet by mouth. Pt takes occasionally  . LANTUS SOLOSTAR 100 UNIT/ML Solostar Pen Inject 32 units under the skin bedtime daily. Increase by 2 units per week until fasting blood sugars are <130.  . metFORMIN (GLUCOPHAGE) 1000 MG tablet Take 1,000 mg by mouth.  . pravastatin (PRAVACHOL) 80 MG tablet Take 1 tablet (80 mg total) by mouth daily.   No current facility-administered medications on file prior to visit.    Review of Systems  Constitutional: Negative for fever and chills.  Eyes: Negative for photophobia and visual disturbance.  Respiratory: Negative for chest tightness, shortness of breath and wheezing.   Cardiovascular: Negative for chest  pain, palpitations and leg swelling.  Gastrointestinal: Negative.   Endocrine: Negative for cold intolerance, heat intolerance, polydipsia, polyphagia and polyuria.  Musculoskeletal:       Toe pain and stinging.   Skin: Positive for color change (redness toes).  Neurological: Positive for numbness. Negative for light-headedness and headaches.  Psychiatric/Behavioral: Negative.    Per HPI unless specifically indicated above     Objective:    BP 146/78 mmHg  Pulse 66  Temp(Src) 98 F (36.7 C) (Oral)  Resp 16  Ht 5\' 8"  (1.727 m)  Wt 202 lb 9.6 oz (91.899 kg)  BMI 30.81 kg/m2  Wt Readings from Last 3 Encounters:  11/17/15 202 lb 9.6 oz (91.899 kg)  10/20/15 205 lb 12.8 oz (93.35 kg)  09/14/15 195 lb (88.451 kg)    Physical Exam  Constitutional: He is oriented to person, place, and time. He appears well-developed and well-nourished. No distress.  HENT:  Head: Normocephalic and atraumatic.  Neck: Neck supple. No thyromegaly present.  Cardiovascular: Normal rate, regular rhythm and normal heart sounds.  Exam reveals no gallop and no friction rub.   No murmur heard. Pulmonary/Chest: Effort normal and breath sounds normal. He has no wheezes.  Abdominal: Soft. Bowel sounds are normal. He exhibits no distension. There is no tenderness. There is no rebound.  Musculoskeletal: Normal range of motion. He exhibits no edema  or tenderness.       Right foot: There is decreased capillary refill (2-3 seconds). There is normal range of motion, no tenderness and no bony tenderness.       Left foot: There is decreased capillary refill (2-3 seconds.). There is normal range of motion, no tenderness, no bony tenderness and no swelling.  Neurological: He is alert and oriented to person, place, and time. He has normal reflexes.  Skin: Skin is warm, dry and intact. No rash noted. There is erythema (mild redness toes).  Yellow rigid toe nails  Psychiatric: He has a normal mood and affect. His behavior is  normal. Thought content normal.   Diabetic Foot Exam - Simple   Simple Foot Form  Diabetic Foot exam was performed with the following findings:  Yes 11/17/2015  8:15 AM  Visual Inspection  See comments:  Yes  Sensation Testing  See comments:  Yes  Pulse Check  Posterior Tibialis and Dorsalis pulse intact bilaterally:  Yes  Comments  Yellow rigid nails. Sensation decreased to monofilament bilateral plantar surface.        Results for orders placed or performed in visit on 11/08/15  HM DIABETES EYE EXAM  Result Value Ref Range   HM Diabetic Eye Exam Retinopathy (A) No Retinopathy      Assessment & Plan:   Problem List Items Addressed This Visit      Cardiovascular and Mediastinum   Essential (primary) hypertension - Primary    Increase lisinopril to 40mg  daily. Continue other medications. Consider adding low dose diuretic now that kidney function is back to normal. Encouraged Dash diet.  Recheck 1 mos.       Relevant Medications   lisinopril (PRINIVIL,ZESTRIL) 40 MG tablet     Nervous and Auditory   DM type 2 with diabetic peripheral neuropathy (HCC)    Neuropathy vs PVD or mixture of both as cause of pain. Trial of increasing gabapentin. Adding topical capsaicin cream twice daily for pain. Consider adding cymbalta for further pain control if not successful.       Relevant Medications   lisinopril (PRINIVIL,ZESTRIL) 40 MG tablet   gabapentin (NEURONTIN) 300 MG capsule   capsaicin (ZOSTRIX) 0.025 % cream    Other Visit Diagnoses    Toe pain, bilateral        Relevant Medications    naproxen (NAPROSYN) 375 MG tablet       Meds ordered this encounter  Medications  . lisinopril (PRINIVIL,ZESTRIL) 40 MG tablet    Sig: Take 1 tablet (40 mg total) by mouth daily.    Dispense:  30 tablet    Refill:  11    Order Specific Question:  Supervising Provider    Answer:  Arlis Porta [phone]  . gabapentin (NEURONTIN) 300 MG capsule    Sig: Take 3 capsules in the AM and  3 capsules in the PM.    Dispense:  150 capsule    Refill:  11    Order Specific Question:  Supervising Provider    Answer:  Arlis Porta [phone]  . capsaicin (ZOSTRIX) 0.025 % cream    Sig: Apply topically 2 (two) times daily.    Dispense:  60 g    Refill:  0    Order Specific Question:  Supervising Provider    Answer:  Arlis Porta [id number]  . naproxen (NAPROSYN) 375 MG tablet    Sig: Take 1 tablet (375 mg total) by mouth 2 (two) times daily  with a meal.    Dispense:  20 tablet    Refill:  1    Order Specific Question:  Supervising Provider    Answer:  Arlis Porta [id number]      Follow up plan: Return in about 1 month (around 12/18/2015) for Diabetes. [name]

## 2015-12-21 ENCOUNTER — Ambulatory Visit: Payer: Medicaid Other | Admitting: Family Medicine

## 2016-01-02 ENCOUNTER — Other Ambulatory Visit: Payer: Self-pay | Admitting: Family Medicine

## 2016-01-22 ENCOUNTER — Ambulatory Visit (INDEPENDENT_AMBULATORY_CARE_PROVIDER_SITE_OTHER): Payer: Medicaid Other | Admitting: Family Medicine

## 2016-01-22 VITALS — BP 164/92 | HR 78 | Temp 98.1°F | Resp 16 | Ht 68.0 in | Wt 202.0 lb

## 2016-01-22 DIAGNOSIS — I10 Essential (primary) hypertension: Secondary | ICD-10-CM

## 2016-01-22 DIAGNOSIS — E1169 Type 2 diabetes mellitus with other specified complication: Secondary | ICD-10-CM | POA: Insufficient documentation

## 2016-01-22 DIAGNOSIS — E1142 Type 2 diabetes mellitus with diabetic polyneuropathy: Secondary | ICD-10-CM | POA: Diagnosis not present

## 2016-01-22 DIAGNOSIS — N183 Chronic kidney disease, stage 3 unspecified: Secondary | ICD-10-CM

## 2016-01-22 DIAGNOSIS — E785 Hyperlipidemia, unspecified: Secondary | ICD-10-CM | POA: Insufficient documentation

## 2016-01-22 LAB — POCT GLYCOSYLATED HEMOGLOBIN (HGB A1C): HEMOGLOBIN A1C: 6.6

## 2016-01-22 MED ORDER — HYDROCHLOROTHIAZIDE 12.5 MG PO TABS: 12.5000 mg | ORAL_TABLET | Freq: Every day | ORAL | Status: DC

## 2016-01-22 MED ORDER — LANTUS SOLOSTAR 100 UNIT/ML ~~LOC~~ SOPN: PEN_INJECTOR | SUBCUTANEOUS | Status: DC

## 2016-01-22 MED ORDER — CARVEDILOL 12.5 MG PO TABS: 12.5000 mg | ORAL_TABLET | Freq: Two times a day (BID) | ORAL | Status: DC

## 2016-01-22 MED ORDER — METFORMIN HCL 1000 MG PO TABS: 1000.0000 mg | ORAL_TABLET | Freq: Two times a day (BID) | ORAL | Status: DC

## 2016-01-22 MED ORDER — ATORVASTATIN CALCIUM 20 MG PO TABS: 20.0000 mg | ORAL_TABLET | Freq: Every day | ORAL | Status: DC

## 2016-01-22 NOTE — Assessment & Plan Note (Signed)
Elevated today. Pt reports he is taking medications as prescribed. Given good kidney function- will add low dose HCTZ to current regimen. Consider hydralazine if uncontrolled in future. Recheck 2-3 weeks and will check BMP at that time.

## 2016-01-22 NOTE — Assessment & Plan Note (Signed)
Last GFR and Cr WNL. Will check BMP next visit at the addition of HCTZ

## 2016-01-22 NOTE — Patient Instructions (Addendum)
Diabetes: Keep up the good work! Your A1c is doing great!  Blood pressure: Add Hydrochlorothiazide pill to your current blood pressure regimen. Take once daily. This will make you pee- so take in the morning. Stay very hydrated! Cholesterol: We are changing you to a new pill called Atorvastatin. Stop taking pravastatin to help control your cholesterol.   Your goal blood pressure is 140/90. Work on low salt/sodium diet - goal <1.5gm (1,500mg ) per day. Eat a diet high in fruits/vegetables and whole grains.  Look into mediterranean and DASH diet. Goal activity is 13min/wk of moderate intensity exercise.  This can be split into 30 minute chunks.  If you are not at this level, you can start with smaller 10-15 min increments and slowly build up activity. Look at [email or web address] for more resources  Please seek immediate medical attention at ER or Urgent Care if you develop: Chest pain, pressure or tightness. Shortness of breath accompanied by nausea or diaphoresis Visual changes Numbness or tingling on one side of the body Facial droop Altered mental status Or any concerning symptoms.

## 2016-01-22 NOTE — Assessment & Plan Note (Signed)
Not at goal. Change to high intensity statin.

## 2016-01-22 NOTE — Assessment & Plan Note (Signed)
Much improved with increased lantus. Will continue current regimen. Recheck UA microalbumin next visits. Eye exam UTD. Foot exam UTD. RTC 3 mos.

## 2016-01-22 NOTE — Progress Notes (Signed)
Subjective:    Patient ID: [name], male    DOB: Jun 23, 1961, 55 y.o.   MRN: [id number]  HPI: [name] is a 55 y.o. male presenting on 01/22/2016 for Hypertension   HPI  Pt presents for BP and diabetes follow-up. Diabetes: A1c is down to 6.6% from 7.4% at last visit. Pt is out of metformin. Taking 36 total units of lantus daily. Checking sugars- avg 140-144. No numbness or tingling in feet- neurontin is helping. Eye exam- 11/06/2015- no retinopathy. Exercise- walks daily. Works in garden.  High cholesterol: Taking pravastain. Still not at goal. No other statins. No myalgias.  HTN: High today. Does take at home. Has taking all medications this morning. No CP. No dizziness. No shortness of breath.   Past Medical History  Diagnosis Date  . Hypertension   . Diabetes mellitus without complication ([place])   . Ankle pain   . Pain in both feet   . Hyperlipidemia     Current Outpatient Prescriptions on File Prior to Visit  Medication Sig  . acetaminophen (TYLENOL 8 HOUR) 650 MG CR tablet Take 1 tablet (650 mg total) by mouth every 8 (eight) hours as needed for pain.  [name] amLODipine (NORVASC) 10 MG tablet Take 10 mg by mouth.  [name] aspirin EC 81 MG tablet Take 81 mg by mouth.  . capsaicin (ZOSTRIX) 0.025 % cream Apply topically 2 (two) times daily.  [name] gabapentin (NEURONTIN) 300 MG capsule Take 3 capsules in the AM and 3 capsules in the PM.  . Insulin Pen Needle (PEN NEEDLES 31GX5/16") 31G X 8 MM MISC   . Iron-Vitamins (GERITOL PO) Take 1 tablet by mouth. Pt takes occasionally  . lisinopril (PRINIVIL,ZESTRIL) 40 MG tablet Take 1 tablet (40 mg total) by mouth daily.  . naproxen (NAPROSYN) 375 MG tablet TAKE 1 TABLET (375 MG TOTAL) BY MOUTH 2 (TWO) TIMES DAILY WITH A MEAL.   No current facility-administered medications on file prior to visit.    Review of Systems  Constitutional: Negative for fever and chills.  HENT: Negative.   Respiratory: Negative for chest tightness,  shortness of breath and wheezing.   Cardiovascular: Negative for chest pain, palpitations and leg swelling.  Gastrointestinal: Negative for nausea, vomiting and abdominal pain.  Endocrine: Negative.   Genitourinary: Negative for dysuria, urgency, discharge, penile pain and testicular pain.  Musculoskeletal: Negative for back pain, joint swelling and arthralgias.  Skin: Negative.   Neurological: Negative for dizziness, weakness, numbness and headaches.  Psychiatric/Behavioral: Negative for sleep disturbance and dysphoric mood.   Per HPI unless specifically indicated above     Objective:    BP 164/92 mmHg  Pulse 78  Temp(Src) 98.1 F (36.7 C) (Oral)  Resp 16  Ht 5\' 8"  (1.727 m)  Wt 202 lb (91.627 kg)  BMI 30.72 kg/m2  Wt Readings from Last 3 Encounters:  01/22/16 202 lb (91.627 kg)  11/17/15 202 lb 9.6 oz (91.899 kg)  10/20/15 205 lb 12.8 oz (93.35 kg)    Physical Exam  Constitutional: He is oriented to person, place, and time. He appears well-developed and well-nourished. No distress.  HENT:  Head: Normocephalic and atraumatic.  Neck: Neck supple. No thyromegaly present.  Cardiovascular: Normal rate, regular rhythm and normal heart sounds.  Exam reveals no gallop and no friction rub.   No murmur heard. Pulmonary/Chest: Effort normal and breath sounds normal. He has no wheezes.  Abdominal: Soft. Bowel sounds are normal. He exhibits no distension. There is no tenderness. There  is no rebound.  Musculoskeletal: Normal range of motion. He exhibits no edema or tenderness.  Neurological: He is alert and oriented to person, place, and time. He has normal reflexes.  Skin: Skin is warm and dry. No rash noted. No erythema.  Psychiatric: He has a normal mood and affect. His behavior is normal. Thought content normal.   Results for orders placed or performed in visit on 01/22/16  POCT HgB A1C  Result Value Ref Range   Hemoglobin A1C 6.6       Assessment & Plan:   Problem List  Items Addressed This Visit      Cardiovascular and Mediastinum   Essential (primary) hypertension    Elevated today. Pt reports he is taking medications as prescribed. Given good kidney function- will add low dose HCTZ to current regimen. Consider hydralazine if uncontrolled in future. Recheck 2-3 weeks and will check BMP at that time.       Relevant Medications   carvedilol (COREG) 12.5 MG tablet   hydrochlorothiazide (HYDRODIURIL) 12.5 MG tablet   atorvastatin (LIPITOR) 20 MG tablet     Nervous and Auditory   DM type 2 with diabetic peripheral neuropathy ([place]) - Primary    Much improved with increased lantus. Will continue current regimen. Recheck UA microalbumin next visits. Eye exam UTD. Foot exam UTD. RTC 3 mos.       Relevant Medications   metFORMIN (GLUCOPHAGE) 1000 MG tablet   LANTUS SOLOSTAR 100 UNIT/ML Solostar Pen   atorvastatin (LIPITOR) 20 MG tablet   Other Relevant Orders   POCT HgB A1C (Completed)     Genitourinary   Chronic kidney disease (CKD), stage III (moderate)    Last GFR and Cr WNL. Will check BMP next visit at the addition of HCTZ        Other   Hyperlipidemia    Not at goal. Change to high intensity statin.       Relevant Medications   carvedilol (COREG) 12.5 MG tablet   hydrochlorothiazide (HYDRODIURIL) 12.5 MG tablet   atorvastatin (LIPITOR) 20 MG tablet      Meds ordered this encounter  Medications  . metFORMIN (GLUCOPHAGE) 1000 MG tablet    Sig: Take 1 tablet (1,000 mg total) by mouth 2 (two) times daily with a meal.    Dispense:  180 tablet    Refill:  3    Order Specific Question:  Supervising Provider    Answer:  Arlis Porta [phone]  . LANTUS SOLOSTAR 100 UNIT/ML Solostar Pen    Sig: Inject 36 units under the skin bedtime daily. Increase by 2 units per week until fasting blood sugars are <130.    Dispense:  15 mL    Refill:  12    Order Specific Question:  Supervising Provider    Answer:  Arlis Porta [phone]  .  carvedilol (COREG) 12.5 MG tablet    Sig: Take 1 tablet (12.5 mg total) by mouth 2 (two) times daily with a meal.    Dispense:  180 tablet    Refill:  3    Order Specific Question:  Supervising Provider    Answer:  Arlis Porta [phone]  . hydrochlorothiazide (HYDRODIURIL) 12.5 MG tablet    Sig: Take 1 tablet (12.5 mg total) by mouth daily.    Dispense:  30 tablet    Refill:  11    Order Specific Question:  Supervising Provider    Answer:  Arlis Porta [phone]  .  atorvastatin (LIPITOR) 20 MG tablet    Sig: Take 1 tablet (20 mg total) by mouth daily.    Dispense:  90 tablet    Refill:  3    Order Specific Question:  Supervising Provider    Answer:  Arlis Porta [id number]      Follow up plan: Return in about 3 weeks (around 02/12/2016), or if symptoms worsen or fail to improve, for HTN.

## 2016-02-12 ENCOUNTER — Ambulatory Visit (INDEPENDENT_AMBULATORY_CARE_PROVIDER_SITE_OTHER): Payer: Medicaid Other | Admitting: Family Medicine

## 2016-02-12 ENCOUNTER — Encounter: Payer: Self-pay | Admitting: Family Medicine

## 2016-02-12 VITALS — BP 152/84 | HR 70 | Temp 97.9°F | Resp 16 | Ht 68.0 in | Wt 204.8 lb

## 2016-02-12 DIAGNOSIS — I10 Essential (primary) hypertension: Secondary | ICD-10-CM

## 2016-02-12 DIAGNOSIS — E1142 Type 2 diabetes mellitus with diabetic polyneuropathy: Secondary | ICD-10-CM | POA: Diagnosis not present

## 2016-02-12 LAB — BASIC METABOLIC PANEL WITH GFR
BUN: 18 mg/dL (ref 7–25)
CHLORIDE: 103 mmol/L (ref 98–110)
CO2: 26 mmol/L (ref 20–31)
CREATININE: 1.21 mg/dL (ref 0.70–1.33)
Calcium: 8.9 mg/dL (ref 8.6–10.3)
GFR, Est African American: 77 mL/min (ref >=60)
GFR, Est Non African American: 67 mL/min (ref >=60)
Glucose, Bld: 152 mg/dL — ABNORMAL HIGH (ref 65–99)
POTASSIUM: 4.4 mmol/L (ref 3.5–5.3)
Sodium: 134 mmol/L — ABNORMAL LOW (ref 135–146)

## 2016-02-12 MED ORDER — BLOOD PRESSURE CUFF MISC: 1.0000 | Freq: Every day | 0 refills | Status: DC

## 2016-02-12 MED ORDER — LANTUS SOLOSTAR 100 UNIT/ML ~~LOC~~ SOPN: PEN_INJECTOR | SUBCUTANEOUS | 12 refills | Status: DC

## 2016-02-12 NOTE — Progress Notes (Signed)
Subjective:    Patient ID: [name], male    DOB: Oct 11, 1960, 55 y.o.   MRN: [id number]  HPI: [name] is a 55 y.o. male presenting on 02/12/2016 for Hypertension   HPI  Pt presents for hypertension follow-up. BP has been hard to control. Today still mildly elevated. Not checking BP at home.  Pt needs refills of lantus.   Past Medical History:  Diagnosis Date  . Ankle pain   . Diabetes mellitus without complication ([place])   . Hyperlipidemia   . Hypertension   . Pain in both feet     Current Outpatient Prescriptions on File Prior to Visit  Medication Sig  . acetaminophen (TYLENOL 8 HOUR) 650 MG CR tablet Take 1 tablet (650 mg total) by mouth every 8 (eight) hours as needed for pain.  [name] amLODipine (NORVASC) 10 MG tablet Take 10 mg by mouth.  [name] aspirin EC 81 MG tablet Take 81 mg by mouth.  [name] atorvastatin (LIPITOR) 20 MG tablet Take 1 tablet (20 mg total) by mouth daily.  . capsaicin (ZOSTRIX) 0.025 % cream Apply topically 2 (two) times daily.  . carvedilol (COREG) 12.5 MG tablet Take 1 tablet (12.5 mg total) by mouth 2 (two) times daily with a meal.  . gabapentin (NEURONTIN) 300 MG capsule Take 3 capsules in the AM and 3 capsules in the PM.  . hydrochlorothiazide (HYDRODIURIL) 12.5 MG tablet Take 1 tablet (12.5 mg total) by mouth daily.  . Insulin Pen Needle (PEN NEEDLES 31GX5/16") 31G X 8 MM MISC   . Iron-Vitamins (GERITOL PO) Take 1 tablet by mouth. Pt takes occasionally  . lisinopril (PRINIVIL,ZESTRIL) 40 MG tablet Take 1 tablet (40 mg total) by mouth daily.  . metFORMIN (GLUCOPHAGE) 1000 MG tablet Take 1 tablet (1,000 mg total) by mouth 2 (two) times daily with a meal.  . naproxen (NAPROSYN) 375 MG tablet TAKE 1 TABLET (375 MG TOTAL) BY MOUTH 2 (TWO) TIMES DAILY WITH A MEAL.   No current facility-administered medications on file prior to visit.     Review of Systems  Constitutional: Negative for chills and fever.  HENT: Negative.   Respiratory: Negative  for chest tightness, shortness of breath and wheezing.   Cardiovascular: Negative for chest pain, palpitations and leg swelling.  Gastrointestinal: Negative for abdominal pain, nausea and vomiting.  Endocrine: Negative.   Genitourinary: Negative for discharge, dysuria, penile pain, testicular pain and urgency.  Musculoskeletal: Negative for arthralgias, back pain and joint swelling.  Skin: Negative.   Neurological: Negative for dizziness, weakness, numbness and headaches.  Psychiatric/Behavioral: Negative for dysphoric mood and sleep disturbance.   Per HPI unless specifically indicated above     Objective:    BP (!) 152/84 (BP Location: Left Arm)   Pulse 70   Temp 97.9 F (36.6 C) (Oral)   Resp 16   Ht 5\' 8"  (1.727 m)   Wt 204 lb 12.8 oz (92.9 kg)   BMI 31.14 kg/m   Wt Readings from Last 3 Encounters:  02/12/16 204 lb 12.8 oz (92.9 kg)  01/22/16 202 lb (91.6 kg)  11/17/15 202 lb 9.6 oz (91.9 kg)    Physical Exam  Constitutional: He is oriented to person, place, and time. He appears well-developed and well-nourished. No distress.  HENT:  Head: Normocephalic and atraumatic.  Neck: Neck supple. No thyromegaly present.  Cardiovascular: Normal rate, regular rhythm and normal heart sounds.  Exam reveals no gallop and no friction rub.   No murmur heard. Pulmonary/Chest:  Effort normal and breath sounds normal. He has no wheezes.  Abdominal: Soft. Bowel sounds are normal. He exhibits no distension. There is no tenderness. There is no rebound.  Musculoskeletal: Normal range of motion. He exhibits no edema or tenderness.  Neurological: He is alert and oriented to person, place, and time. He has normal reflexes.  Skin: Skin is warm and dry. No rash noted. No erythema.  Psychiatric: He has a normal mood and affect. His behavior is normal. Thought content normal.   Results for orders placed or performed in visit on 01/22/16  POCT HgB A1C  Result Value Ref Range   Hemoglobin A1C 6.6         Assessment & Plan:   Problem List Items Addressed This Visit      Cardiovascular and Mediastinum   Essential (primary) hypertension - Primary    Check BMP today. Tentatively increase HCTZ to 25mg  daily pending kidney function. IF incr Cr will plan to increase the carvedilol to 25mg  BID.  BP cuff prescription given so patient can monitor at home.  Consider hydralazine if not improving.        Relevant Medications   Blood Pressure Monitoring (BLOOD PRESSURE CUFF) MISC   Other Relevant Orders   BASIC METABOLIC PANEL WITH GFR     Nervous and Auditory   DM type 2 with diabetic peripheral neuropathy (HCC)    Renewed lantus.       Relevant Medications   LANTUS SOLOSTAR 100 UNIT/ML Solostar Pen    Other Visit Diagnoses   None.     Meds ordered this encounter  Medications  . LANTUS SOLOSTAR 100 UNIT/ML Solostar Pen    Sig: Inject 36 units under the skin bedtime daily. Increase by 2 units per week until fasting blood sugars are <130.    Dispense:  15 mL    Refill:  12    Order Specific Question:   Supervising Provider    Answer:   Arlis Porta [phone]  . Blood Pressure Monitoring (BLOOD PRESSURE CUFF) MISC    Sig: 1 each by Does not apply route daily.    Dispense:  1 each    Refill:  0    Order Specific Question:   Supervising Provider    Answer:   Arlis Porta [id number]      Follow up plan: Return in about 1 month (around 03/14/2016), or if symptoms worsen or fail to improve, for BP check. [name]

## 2016-02-12 NOTE — Assessment & Plan Note (Signed)
Renewed lantus.

## 2016-02-12 NOTE — Patient Instructions (Addendum)
We will check your kidney function today. For now take 2 of the hydrochlorothiazide once daily.   Please get a blood pressure cuff so we can check your blood pressure at home. Your goal blood pressure is 140/90 Work on low salt/sodium diet - goal <1.5gm (1,500mg ) per day. Eat a diet high in fruits/vegetables and whole grains.  Look into mediterranean and DASH diet. Goal activity is 151min/wk of moderate intensity exercise.  This can be split into 30 minute chunks.  If you are not at this level, you can start with smaller 10-15 min increments and slowly build up activity. Look at [place].org for more resources  Please seek immediate medical attention at ER or Urgent Care if you develop: Chest pain, pressure or tightness. Shortness of breath accompanied by nausea or diaphoresis Visual changes Numbness or tingling on one side of the body Facial droop Altered mental status Or any concerning symptoms.

## 2016-02-12 NOTE — Assessment & Plan Note (Signed)
Check BMP today. Tentatively increase HCTZ to 25mg  daily pending kidney function. IF incr Cr will plan to increase the carvedilol to 25mg  BID.  BP cuff prescription given so patient can monitor at home.  Consider hydralazine if not improving.

## 2016-03-14 ENCOUNTER — Ambulatory Visit (INDEPENDENT_AMBULATORY_CARE_PROVIDER_SITE_OTHER): Payer: Medicaid Other | Admitting: Family Medicine

## 2016-03-14 ENCOUNTER — Encounter: Payer: Self-pay | Admitting: Family Medicine

## 2016-03-14 VITALS — BP 144/82 | HR 66 | Temp 98.2°F | Resp 16 | Ht 68.0 in | Wt 203.6 lb

## 2016-03-14 DIAGNOSIS — E1142 Type 2 diabetes mellitus with diabetic polyneuropathy: Secondary | ICD-10-CM

## 2016-03-14 DIAGNOSIS — I10 Essential (primary) hypertension: Secondary | ICD-10-CM | POA: Diagnosis not present

## 2016-03-14 MED ORDER — DULOXETINE HCL 60 MG PO CPEP: 60.0000 mg | ORAL_CAPSULE | Freq: Every day | ORAL | 11 refills | Status: DC

## 2016-03-14 NOTE — Assessment & Plan Note (Signed)
Unclear if previous medication changes were successful since pt is out of medications. No medication changes today. Pt to return next week for nurse BP check. Encouraged pt to check BP at home or drug store.  Recheck 1 weeks.

## 2016-03-14 NOTE — Assessment & Plan Note (Signed)
Add Cymbalta to help control painful diabetic neuropathy. Continue gabapentin.  Capsaicin cream PRN.  Recheck A1c 6 weeks.

## 2016-03-14 NOTE — Progress Notes (Signed)
Subjective:    Patient ID: [name], male    DOB: Jul 05, 1961, 55 y.o.   MRN: [id number]  HPI: [name] is a 55 y.o. male presenting on 03/14/2016 for Hypertension and Diabetes (home BS around 140)   HPI  Pt presents for hypertension follow-up.  His BP is elevated but he states he has been out of his medications for 3 days.  He is out of lisinopril and HCTZ. At home he is just taking carvedilol and amlodipine. Was not checking blood pressures at home. At home was taking 2 HCTZ daily.  Diabetic neuropathy- taking 3 AM and 4 at night. Hurts at night the worst. Gabapentin is no longer helping Has tried capsaicin cream- without relief.   Past Medical History:  Diagnosis Date  . Ankle pain   . Diabetes mellitus without complication ([place])   . Hyperlipidemia   . Hypertension   . Pain in both feet     Current Outpatient Prescriptions on File Prior to Visit  Medication Sig  . acetaminophen (TYLENOL 8 HOUR) 650 MG CR tablet Take 1 tablet (650 mg total) by mouth every 8 (eight) hours as needed for pain.  [name] amLODipine (NORVASC) 10 MG tablet Take 10 mg by mouth.  [name] aspirin EC 81 MG tablet Take 81 mg by mouth.  [name] atorvastatin (LIPITOR) 20 MG tablet Take 1 tablet (20 mg total) by mouth daily.  . Blood Pressure Monitoring (BLOOD PRESSURE CUFF) MISC 1 each by Does not apply route daily.  . capsaicin (ZOSTRIX) 0.025 % cream Apply topically 2 (two) times daily.  . carvedilol (COREG) 12.5 MG tablet Take 1 tablet (12.5 mg total) by mouth 2 (two) times daily with a meal.  . gabapentin (NEURONTIN) 300 MG capsule Take 3 capsules in the AM and 3 capsules in the PM.  . hydrochlorothiazide (HYDRODIURIL) 12.5 MG tablet Take 1 tablet (12.5 mg total) by mouth daily.  . Insulin Pen Needle (PEN NEEDLES 31GX5/16") 31G X 8 MM MISC   . Iron-Vitamins (GERITOL PO) Take 1 tablet by mouth. Pt takes occasionally  . LANTUS SOLOSTAR 100 UNIT/ML Solostar Pen Inject 36 units under the skin bedtime daily.  Increase by 2 units per week until fasting blood sugars are <130.  [name] lisinopril (PRINIVIL,ZESTRIL) 40 MG tablet Take 1 tablet (40 mg total) by mouth daily.  . metFORMIN (GLUCOPHAGE) 1000 MG tablet Take 1 tablet (1,000 mg total) by mouth 2 (two) times daily with a meal.   No current facility-administered medications on file prior to visit.     Review of Systems  Constitutional: Negative for chills and fever.  HENT: Negative.   Respiratory: Negative for chest tightness, shortness of breath and wheezing.   Cardiovascular: Negative for chest pain, palpitations and leg swelling.  Gastrointestinal: Negative for abdominal pain, nausea and vomiting.  Endocrine: Negative.   Genitourinary: Negative for discharge, dysuria, penile pain, testicular pain and urgency.  Musculoskeletal: Negative for arthralgias, back pain and joint swelling.  Skin: Negative.   Neurological: Negative for dizziness, weakness, numbness and headaches.       Paresthesias feet  Psychiatric/Behavioral: Negative for dysphoric mood and sleep disturbance.   Per HPI unless specifically indicated above     Objective:    BP (!) 144/82 (BP Location: Right Arm, Cuff Size: Large)   Pulse 66   Temp 98.2 F (36.8 C) (Oral)   Resp 16   Ht 5\' 8"  (1.727 m)   Wt 203 lb 9.6 oz (92.4 kg)  BMI 30.96 kg/m   Wt Readings from Last 3 Encounters:  03/14/16 203 lb 9.6 oz (92.4 kg)  02/12/16 204 lb 12.8 oz (92.9 kg)  01/22/16 202 lb (91.6 kg)    Physical Exam  Constitutional: He is oriented to person, place, and time. He appears well-developed and well-nourished. No distress.  HENT:  Head: Normocephalic and atraumatic.  Neck: Neck supple. No thyromegaly present.  Cardiovascular: Normal rate, regular rhythm and normal heart sounds.  Exam reveals no gallop and no friction rub.   No murmur heard. Pulmonary/Chest: Effort normal and breath sounds normal. He has no wheezes.  Abdominal: Soft. Bowel sounds are normal. He exhibits no  distension. There is no tenderness. There is no rebound.  Musculoskeletal: Normal range of motion. He exhibits no edema or tenderness.  Neurological: He is alert and oriented to person, place, and time. He has normal reflexes.  Skin: Skin is warm and dry. No rash noted. No erythema.  Psychiatric: He has a normal mood and affect. His behavior is normal. Thought content normal.   Results for orders placed or performed in visit on [id number]  BASIC METABOLIC PANEL WITH GFR  Result Value Ref Range   Sodium 134 (L) 135 - 146 mmol/L   Potassium 4.4 3.5 - 5.3 mmol/L   Chloride 103 98 - 110 mmol/L   CO2 26 20 - 31 mmol/L   Glucose, Bld 152 (H) 65 - 99 mg/dL   BUN 18 7 - 25 mg/dL   Creat 1.21 0.70 - 1.33 mg/dL   Calcium 8.9 8.6 - 10.3 mg/dL   GFR, Est African American 77 >=60 mL/min   GFR, Est Non African American 67 >=60 mL/min      Assessment & Plan:   Problem List Items Addressed This Visit      Cardiovascular and Mediastinum   Essential (primary) hypertension - Primary    Unclear if previous medication changes were successful since pt is out of medications. No medication changes today. Pt to return next week for nurse BP check. Encouraged pt to check BP at home or drug store.  Recheck 1 weeks.         Nervous and Auditory   DM type 2 with diabetic peripheral neuropathy (HCC)    Add Cymbalta to help control painful diabetic neuropathy. Continue gabapentin.  Capsaicin cream PRN.  Recheck A1c 6 weeks.       Relevant Medications   DULoxetine (CYMBALTA) 60 MG capsule    Other Visit Diagnoses   None.     Meds ordered this encounter  Medications  . DULoxetine (CYMBALTA) 60 MG capsule    Sig: Take 1 capsule (60 mg total) by mouth daily.    Dispense:  30 capsule    Refill:  11    Order Specific Question:   Supervising Provider    Answer:   Arlis Porta [phone]      Follow up plan: Return in about 6 weeks (around 04/25/2016), or if symptoms worsen or fail to improve,  for Diabetes and 1 week for Nurse BP check.

## 2016-03-14 NOTE — Patient Instructions (Signed)
Please pick up your medications.  Check blood pressure when you are at the drug store.  Your goal blood pressure is 140/90 Work on low salt/sodium diet - goal <1.5gm (1,500mg ) per day. Eat a diet high in fruits/vegetables and whole grains.  Look into mediterranean and DASH diet. Goal activity is 187min/wk of moderate intensity exercise.  This can be split into 30 minute chunks.  If you are not at this level, you can start with smaller 10-15 min increments and slowly build up activity. Look at [employer].org for more resources   Please come back sometime next week for a nurse visit to get your blood pressure checked. Make sure you have taken your medications at least 1 hour before.

## 2016-03-21 ENCOUNTER — Ambulatory Visit: Payer: Medicaid Other

## 2016-03-21 VITALS — BP 146/98 | HR 83

## 2016-03-21 DIAGNOSIS — I10 Essential (primary) hypertension: Secondary | ICD-10-CM

## 2016-03-21 NOTE — Progress Notes (Signed)
Pt was here to check blood pressure as per Amy.

## 2016-04-24 ENCOUNTER — Ambulatory Visit (INDEPENDENT_AMBULATORY_CARE_PROVIDER_SITE_OTHER): Payer: Medicaid Other | Admitting: Family Medicine

## 2016-04-24 ENCOUNTER — Encounter: Payer: Self-pay | Admitting: Family Medicine

## 2016-04-24 VITALS — BP 150/82 | HR 65 | Temp 98.0°F | Resp 16 | Ht 68.0 in | Wt 205.0 lb

## 2016-04-24 DIAGNOSIS — E1142 Type 2 diabetes mellitus with diabetic polyneuropathy: Secondary | ICD-10-CM | POA: Diagnosis not present

## 2016-04-24 DIAGNOSIS — I10 Essential (primary) hypertension: Secondary | ICD-10-CM

## 2016-04-24 DIAGNOSIS — M10072 Idiopathic gout, left ankle and foot: Secondary | ICD-10-CM | POA: Diagnosis not present

## 2016-04-24 LAB — POCT GLYCOSYLATED HEMOGLOBIN (HGB A1C): HEMOGLOBIN A1C: 7.5

## 2016-04-24 MED ORDER — ALPHA-LIPOIC ACID 600 MG PO CAPS: 1.0000 | ORAL_CAPSULE | Freq: Every day | ORAL | 11 refills | Status: DC

## 2016-04-24 NOTE — Assessment & Plan Note (Signed)
Check kidney function today and renin-aldosterone levels. Pending kidney function will either change change diurectic to chlorthalidone or plan on adding hydralazine to current regimen for better control. Consider vascular referral for work-up renal artery stenosis if still continues to be hypertensive.  Alarm symptoms reviewed. Recheck 1 mos.

## 2016-04-24 NOTE — Assessment & Plan Note (Addendum)
Marked increase in A1c- pt reports he has not been adhering to the diabetic diet. Discussed going to nutrition for a refresher on the diabetic diet. Encouraged pt to limit high carb foods such as beans and corn. Encouraged fresh vegetable or frozen vegetables as sides for meals. Avoid sugar sweetened beverages. Plan to add 2 units insulin every 7 days for fasting blood sugar >130mg /dl.  Sugar check 1 mos.

## 2016-04-24 NOTE — Patient Instructions (Signed)
Please call the [facility] to set up a refresher diabetes course: [phone]   Blood pressure: We will check some labs today to determine which medication to use. I will contact you tomorrow to let you know which medication to add or change.   Foot pain_ Let's try adding alpha lipoic acid to see if that helps with your symptoms.

## 2016-04-24 NOTE — Progress Notes (Signed)
Subjective:    Patient ID: [name], male    DOB: 08/19/1960, 55 y.o.   MRN: [id number]  HPI: [name] is a 55 y.o. male presenting on 04/24/2016 for Hypertension   HPI  Pt presents for follow-up of blood pressure. It remains elevated. No chest pain. No SOB, dizziness, or visual changes. Taking Carvedilol twice daily, Amlodipine 10mg , Lisinopril 40mg  and HCTZ 12.5mg  once daily.  Trial Cymbalta for peripheral neuropathy. He could not tolerate the medication afte 2 doses. It put him in "[place]" At times the feet are very painful and uncomfortable. States it feels at times like gout. Toes feel like they are going to "blow up" get red and swollen. Mainly at night.  Due for diabetes follow-up today. A1c has increased from 6.6% to 7.5%. Taking 1000mg  of metformin and 36 units of lantus once daily. Avg fasting sugars are 150 in the AM. Pt reports his diet has changed. He is snacking at night.  Still walking daily. Typical meals include- eats lots of hamburgers. Eats lot of beans. Tries to avoid potatoes. Eats lots of corn.  Declines flu shot today.   Past Medical History:  Diagnosis Date  . Ankle pain   . Diabetes mellitus without complication ([place])   . Hyperlipidemia   . Hypertension   . Pain in both feet     Current Outpatient Prescriptions on File Prior to Visit  Medication Sig  . acetaminophen (TYLENOL 8 HOUR) 650 MG CR tablet Take 1 tablet (650 mg total) by mouth every 8 (eight) hours as needed for pain.  [name] amLODipine (NORVASC) 10 MG tablet Take 10 mg by mouth.  [name] aspirin EC 81 MG tablet Take 81 mg by mouth.  [name] atorvastatin (LIPITOR) 20 MG tablet Take 1 tablet (20 mg total) by mouth daily.  . Blood Pressure Monitoring (BLOOD PRESSURE CUFF) MISC 1 each by Does not apply route daily.  . capsaicin (ZOSTRIX) 0.025 % cream Apply topically 2 (two) times daily.  . carvedilol (COREG) 12.5 MG tablet Take 1 tablet (12.5 mg total) by mouth 2 (two) times daily with a meal.  .  DULoxetine (CYMBALTA) 60 MG capsule Take 1 capsule (60 mg total) by mouth daily.  [name] gabapentin (NEURONTIN) 300 MG capsule Take 3 capsules in the AM and 3 capsules in the PM.  . hydrochlorothiazide (HYDRODIURIL) 12.5 MG tablet Take 1 tablet (12.5 mg total) by mouth daily.  . Insulin Pen Needle (PEN NEEDLES 31GX5/16") 31G X 8 MM MISC   . Iron-Vitamins (GERITOL PO) Take 1 tablet by mouth. Pt takes occasionally  . LANTUS SOLOSTAR 100 UNIT/ML Solostar Pen Inject 36 units under the skin bedtime daily. Increase by 2 units per week until fasting blood sugars are <130.  [name] lisinopril (PRINIVIL,ZESTRIL) 40 MG tablet Take 1 tablet (40 mg total) by mouth daily.  . metFORMIN (GLUCOPHAGE) 1000 MG tablet Take 1 tablet (1,000 mg total) by mouth 2 (two) times daily with a meal.   No current facility-administered medications on file prior to visit.     Review of Systems  Constitutional: Negative for chills and fever.  HENT: Negative.   Respiratory: Negative for chest tightness, shortness of breath and wheezing.   Cardiovascular: Negative for chest pain, palpitations and leg swelling.  Gastrointestinal: Negative for abdominal pain, nausea and vomiting.  Endocrine: Negative.   Genitourinary: Negative for discharge, dysuria, penile pain, testicular pain and urgency.  Musculoskeletal: Negative for arthralgias, back pain and joint swelling.  Skin: Negative.  Neurological: Positive for numbness (paresthesia feet. ). Negative for dizziness, weakness and headaches.  Psychiatric/Behavioral: Negative for dysphoric mood and sleep disturbance.   Per HPI unless specifically indicated above     Objective:    BP (!) 150/82 (BP Location: Right Arm)   Pulse 65   Temp 98 F (36.7 C) (Oral)   Resp 16   Ht 5\' 8"  (1.727 m)   Wt 205 lb (93 kg)   BMI 31.17 kg/m   Wt Readings from Last 3 Encounters:  04/24/16 205 lb (93 kg)  03/14/16 203 lb 9.6 oz (92.4 kg)  02/12/16 204 lb 12.8 oz (92.9 kg)    Physical Exam    Constitutional: He is oriented to person, place, and time. He appears well-developed and well-nourished. No distress.  HENT:  Head: Normocephalic and atraumatic.  Neck: Neck supple. No thyromegaly present.  Cardiovascular: Normal rate, regular rhythm and normal heart sounds.  Exam reveals no gallop and no friction rub.   No murmur heard. Pulmonary/Chest: Effort normal and breath sounds normal. He has no wheezes.  Abdominal: Soft. Bowel sounds are normal. He exhibits no distension. There is no tenderness. There is no rebound.  Musculoskeletal: Normal range of motion. He exhibits no edema or tenderness.  Neurological: He is alert and oriented to person, place, and time. He has normal reflexes.  Skin: Skin is warm and dry. No rash noted. No erythema.  Psychiatric: He has a normal mood and affect. His behavior is normal. Thought content normal.   Diabetic Foot Exam - Simple   Simple Foot Form Diabetic Foot exam was performed with the following findings:  Yes 04/24/2016 10:28 AM  Visual Inspection No deformities, no ulcerations, no other skin breakdown bilaterally:  Yes Sensation Testing See comments:  Yes Pulse Check Posterior Tibialis and Dorsalis pulse intact bilaterally:  Yes Comments Decreased sensation to monofilament 2-4th toes bilaterally.      Results for orders placed or performed in visit on 04/24/16  POCT HgB A1C  Result Value Ref Range   Hemoglobin A1C 7.5       Assessment & Plan:   Problem List Items Addressed This Visit      Cardiovascular and Mediastinum   Resistant hypertension    Check kidney function today and renin-aldosterone levels. Pending kidney function will either change change diurectic to chlorthalidone or plan on adding hydralazine to current regimen for better control. Consider vascular referral for work-up renal artery stenosis if still continues to be hypertensive.  Alarm symptoms reviewed. Recheck 1 mos.       Relevant Orders   Aldosterone +  renin activity w/ ratio   BASIC METABOLIC PANEL WITH GFR     Endocrine   DM type 2 with diabetic peripheral neuropathy (HCC)    Marked increase in A1c- pt reports he has not been adhering to the diabetic diet. Discussed going to nutrition for a refresher on the diabetic diet. Encouraged pt to limit high carb foods such as beans and corn. Encouraged fresh vegetable or frozen vegetables as sides for meals. Avoid sugar sweetened beverages. Plan to add 2 units insulin every 7 days for fasting blood sugar >130mg /dl.  Sugar check 1 mos.       Relevant Medications   Alpha-Lipoic Acid 600 MG CAPS   Other Relevant Orders   POCT HgB A1C (Completed)   Ambulatory referral to diabetic education    Other Visit Diagnoses    Acute idiopathic gout involving toe of left foot    -  Primary   Will check uric acid levels today due report of symptoms being like gout. However more likely symptoms are peripheral neuropathy related. If uric acid elevated-   Relevant Orders   Uric acid      Meds ordered this encounter  Medications  . Alpha-Lipoic Acid 600 MG CAPS    Sig: Take 1 capsule (600 mg total) by mouth daily.    Dispense:  30 each    Refill:  11    Order Specific Question:   Supervising Provider    Answer:   Arlis Porta [phone]      Follow up plan: Return in about 4 weeks (around 05/22/2016), or if symptoms worsen or fail to improve, for Blood pressure check. [name]

## 2016-04-25 ENCOUNTER — Telehealth: Payer: Self-pay | Admitting: Family Medicine

## 2016-04-25 DIAGNOSIS — I10 Essential (primary) hypertension: Secondary | ICD-10-CM

## 2016-04-25 DIAGNOSIS — E875 Hyperkalemia: Secondary | ICD-10-CM

## 2016-04-25 LAB — BASIC METABOLIC PANEL WITH GFR
BUN: 31 mg/dL — AB (ref 7–25)
CHLORIDE: 101 mmol/L (ref 98–110)
CO2: 24 mmol/L (ref 20–31)
Calcium: 9.6 mg/dL (ref 8.6–10.3)
Creat: 1.42 mg/dL — ABNORMAL HIGH (ref 0.70–1.33)
GFR, EST NON AFRICAN AMERICAN: 55 mL/min — AB (ref >=60)
GFR, Est African American: 63 mL/min (ref >=60)
Glucose, Bld: 164 mg/dL — ABNORMAL HIGH (ref 65–99)
POTASSIUM: 6.2 mmol/L — AB (ref 3.5–5.3)
Sodium: 133 mmol/L — ABNORMAL LOW (ref 135–146)

## 2016-04-25 LAB — URIC ACID: Uric Acid, Serum: 5.6 mg/dL (ref 4.0–8.0)

## 2016-04-25 MED ORDER — FUROSEMIDE 20 MG PO TABS: ORAL_TABLET | ORAL | 0 refills | Status: DC

## 2016-04-25 MED ORDER — HYDRALAZINE HCL 25 MG PO TABS: 25.0000 mg | ORAL_TABLET | Freq: Two times a day (BID) | ORAL | 2 refills | Status: DC

## 2016-04-25 NOTE — Telephone Encounter (Signed)
See previously opened telephone note.

## 2016-04-25 NOTE — Telephone Encounter (Signed)
Attempted to call patient regarding his elevated potassium level today of 6.2 today. His daughter in law has the phone- she will give it back and have him call us.  His potassium level is very high. I would like him to take Lasix 40mg  today and 20mg  daily for 3 days. STOP his lisinopril and his HCTZ today.  He needs to come in for lab work tomorrow morning to recheck his potassium level.  We will start hydralazine 25mg  twice daily at this time. I would like him to see Dr. Raliegh Ip back in 2 weeks.

## 2016-04-25 NOTE — Telephone Encounter (Signed)
Spoke with the wife. They will pick up lasix and he will have stat labs drawn tomorrow.

## 2016-04-25 NOTE — Telephone Encounter (Signed)
Called pt. LMTCB 

## 2016-04-25 NOTE — Telephone Encounter (Signed)
Pt. Wife return your call  Her  Call back # is  [phone]

## 2016-04-26 ENCOUNTER — Other Ambulatory Visit: Payer: Self-pay | Admitting: Family Medicine

## 2016-04-26 ENCOUNTER — Telehealth: Payer: Self-pay | Admitting: Family Medicine

## 2016-04-26 ENCOUNTER — Other Ambulatory Visit: Payer: Medicaid Other

## 2016-04-26 ENCOUNTER — Other Ambulatory Visit: Payer: Self-pay

## 2016-04-26 DIAGNOSIS — E875 Hyperkalemia: Secondary | ICD-10-CM

## 2016-04-26 LAB — BASIC METABOLIC PANEL WITH GFR
BUN: 33 mg/dL — AB (ref 7–25)
CALCIUM: 9.8 mg/dL (ref 8.6–10.3)
CHLORIDE: 100 mmol/L (ref 98–110)
CO2: 26 mmol/L (ref 20–31)
CREATININE: 1.84 mg/dL — AB (ref 0.70–1.33)
GFR, Est African American: 46 mL/min — ABNORMAL LOW (ref >=60)
GFR, Est Non African American: 40 mL/min — ABNORMAL LOW (ref >=60)
GLUCOSE: 140 mg/dL — AB (ref 65–99)
Potassium: 5.3 mmol/L (ref 3.5–5.3)
SODIUM: 135 mmol/L (ref 135–146)

## 2016-04-26 NOTE — Progress Notes (Signed)
Medication changes for decreased kidney function.

## 2016-04-26 NOTE — Telephone Encounter (Signed)
Called pt and wife to review labs. Potassium is back to normal but his kidney function has worsened slightly. Take lasix today only. Continue not to take Lisinopril 40mg  and Hydrochlorathiazide 12.mg once daily. Take instead Hydralazine 25mg  twice daily.  Please follow-up with Dr. Raliegh Ip in 2 weeks.

## 2016-04-30 LAB — ALDOSTERONE + RENIN ACTIVITY W/ RATIO
ALDO / PRA RATIO: 0.4 ratio — AB (ref 0.9–28.9)
ALDOSTERONE: 4 ng/dL
PRA LC/MS/MS: 11.25 ng/mL/h — ABNORMAL HIGH (ref 0.25–5.82)

## 2016-05-03 ENCOUNTER — Other Ambulatory Visit: Payer: Self-pay | Admitting: Family Medicine

## 2016-05-03 DIAGNOSIS — N183 Chronic kidney disease, stage 3 unspecified: Secondary | ICD-10-CM

## 2016-05-03 DIAGNOSIS — R7989 Other specified abnormal findings of blood chemistry: Secondary | ICD-10-CM

## 2016-05-03 DIAGNOSIS — I10 Essential (primary) hypertension: Secondary | ICD-10-CM

## 2016-05-03 DIAGNOSIS — R799 Abnormal finding of blood chemistry, unspecified: Secondary | ICD-10-CM

## 2016-05-22 ENCOUNTER — Ambulatory Visit (INDEPENDENT_AMBULATORY_CARE_PROVIDER_SITE_OTHER): Payer: Medicaid Other | Admitting: Family Medicine

## 2016-05-22 ENCOUNTER — Encounter: Payer: Self-pay | Admitting: Family Medicine

## 2016-05-22 VITALS — BP 164/84 | HR 67 | Temp 97.9°F | Resp 16 | Ht 68.0 in | Wt 207.0 lb

## 2016-05-22 DIAGNOSIS — N183 Chronic kidney disease, stage 3 unspecified: Secondary | ICD-10-CM

## 2016-05-22 DIAGNOSIS — I10 Essential (primary) hypertension: Secondary | ICD-10-CM | POA: Diagnosis not present

## 2016-05-22 MED ORDER — AMLODIPINE BESYLATE 10 MG PO TABS: 10.0000 mg | ORAL_TABLET | Freq: Every day | ORAL | 5 refills | Status: DC

## 2016-05-22 MED ORDER — HYDRALAZINE HCL 25 MG PO TABS: 25.0000 mg | ORAL_TABLET | Freq: Three times a day (TID) | ORAL | 5 refills | Status: DC

## 2016-05-22 NOTE — Patient Instructions (Signed)
Thank you for coming in to clinic today.  1. BP is still elevated. - Increase Hydralazine 25mg  to THREE times a day (instead of twice a day) - Continue other 2 blood pressure pills, refills sent to pharmacy  - DO NOT RESTART Hydrochlorothiazide or Lisinopril yet, please discuss these medications with your Kidney Doctor  Recommend check BP with cuff at the pharmacy, write down your numbers, check this 1 to 2 times a week between now and next appointment  Try to quit drinking caffeine (coffee, soda, tea) this will help your BP Start more regular exercise with bike and walking every day  Also write down blood sugar readings  Please schedule a follow-up appointment with Dr. Parks Ranger in 3 months for Diabetes, A1c, BP check follow-up  If you have any other questions or concerns, please feel free to call the clinic or send a message through [place]. You may also schedule an earlier appointment if necessary.  [name], DO [place]

## 2016-05-22 NOTE — Assessment & Plan Note (Signed)
Chronic elevated Cr, gradual worsening recently with AoCKD with Cr up to 1.8 and GFF < 50 - Continue to hold HCTZ and ACEi - No NSAIDs - Improve hydration - Follow-up with Nephrology to establish for resistant HTN, adjust BP meds today

## 2016-05-22 NOTE — Progress Notes (Signed)
Subjective:    Patient ID: [name], male    DOB: 1960-10-04, 55 y.o.   MRN: [id number]  [name] is a 55 y.o. male presenting on 05/22/2016 for Hypertension   HPI  CHRONIC RESISTANT HTN / Elevated PRA Reports does not check BP at home. Has scheduled apt with Nephrology 06/24/16 to establish care due to resistant HTN. Last visit 04/2016 due to elevated Cr and abnormal PRA, he was discontinued from Lisinopril and HCTZ (also had HyperK), started on Hydralazine 25mg  BID. Last labs with improved normalized K but still worsening Cr. Current Meds - Carvedilol 12.5mg  BID, Hydralazine 25mg  BID (started 04/2016), Amlodipine 10mg  daily (he continues to HOLD HCTZ 12.5 and Lisinopril 40 due to Cr)   Reports good compliance, took meds today. Tolerating well, w/o complaints. Lifestyle - Regular exercise with walking does yardwork at home and they have a lot [place] with animals and garden, interested in riding a bike for exercise, tries to limit salt intake, tries to follow DM diet Denies CP, dyspnea, HA, edema, dizziness / lightheadedness   Social History  Substance Use Topics  . Smoking status: Never Smoker  . Smokeless tobacco: Never Used  . Alcohol use Yes    Review of Systems Per HPI unless specifically indicated above     Objective:    BP (!) 164/84 (BP Location: Left Arm, Cuff Size: Normal)   Pulse 67   Temp 97.9 F (36.6 C) (Oral)   Resp 16   Ht 5\' 8"  (1.727 m)   Wt 207 lb (93.9 kg)   BMI 31.47 kg/m   Wt Readings from Last 3 Encounters:  05/22/16 207 lb (93.9 kg)  04/24/16 205 lb (93 kg)  03/14/16 203 lb 9.6 oz (92.4 kg)    Physical Exam  Constitutional: He is oriented to person, place, and time. He appears well-developed and well-nourished. No distress.  Well-appearing, comfortable, cooperative  HENT:  Head: Normocephalic and atraumatic.  Mouth/Throat: Oropharynx is clear and moist.  Cardiovascular: Normal rate, regular rhythm, normal heart sounds  and intact distal pulses.   No murmur heard. Pulmonary/Chest: Effort normal.  Musculoskeletal: He exhibits no edema.  Neurological: He is alert and oriented to person, place, and time.  Skin: Skin is warm and dry. He is not diaphoretic.  Psychiatric: He has a normal mood and affect. His behavior is normal.  Nursing note and vitals reviewed.    I have personally reviewed the following lab results from 04/26/16.  Results for orders placed or performed in visit on 48/54/62  BASIC METABOLIC PANEL WITH GFR  Result Value Ref Range   Sodium 135 135 - 146 mmol/L   Potassium 5.3 3.5 - 5.3 mmol/L   Chloride 100 98 - 110 mmol/L   CO2 26 20 - 31 mmol/L   Glucose, Bld 140 (H) 65 - 99 mg/dL   BUN 33 (H) 7 - 25 mg/dL   Creat 1.84 (H) 0.70 - 1.33 mg/dL   Calcium 9.8 8.6 - 10.3 mg/dL   GFR, Est African American 46 (L) >=60 mL/min   GFR, Est Non African American 40 (L) >=60 mL/min      Assessment & Plan:   Problem List Items Addressed This Visit    Resistant hypertension - Primary    Uncontrolled BP, persistent resistant HTN, compared chart review prior BPs. Does not check at home. Only mild improvement on manual re-check. - Complicated with gradual worsening Cr up to 1.8, also aldosterone lab normal but elevated PRA,  concerned for secondary etiology of HTN, as patient seems adherent to medications  Plan: 1. Increase Hydralazine from 25 BID to TID, new rx sent, marked on patient's bottle 2. Continue Carvedilol 12.5mg  BID, Amlodpine 10 - refill sent - limited options, pulse in 60s, decide against increasing BB 3. Keep HOLDing HCTZ 12.5 and Lisinopril 40 given elevated Cr, awaiting Nephrology follow-up 4. Check BP at drug store, write down readings, given log 5. Low sodium DM diet, start more regular exercise with biking, stop caffeine intake 6. Follow-up 3 months BP re-check, follow-up sooner if needed, return criteria given      Relevant Medications   hydrochlorothiazide (HYDRODIURIL) 12.5  MG tablet   lisinopril (PRINIVIL,ZESTRIL) 40 MG tablet   hydrALAZINE (APRESOLINE) 25 MG tablet   amLODipine (NORVASC) 10 MG tablet   Chronic kidney disease (CKD), stage III (moderate)    Chronic elevated Cr, gradual worsening recently with AoCKD with Cr up to 1.8 and GFF < 50 - Continue to hold HCTZ and ACEi - No NSAIDs - Improve hydration - Follow-up with Nephrology to establish for resistant HTN, adjust BP meds today         Meds ordered this encounter  Medications  . DISCONTD: furosemide (LASIX) 20 MG tablet    Sig: TAKE 2 TABLETS BY MOUTH TODAY AND 1 TABLET DAILY FOR 3 DAYS.    Refill:  0  .           .           . hydrALAZINE (APRESOLINE) 25 MG tablet    Sig: Take 1 tablet (25 mg total) by mouth 3 (three) times daily.    Dispense:  90 tablet    Refill:  5  . amLODipine (NORVASC) 10 MG tablet    Sig: Take 1 tablet (10 mg total) by mouth daily.    Dispense:  30 tablet    Refill:  5      Follow up plan: Return in about 3 months (around 08/22/2016) for diabetes, blood pressure.  [name], [facility] 05/22/2016, 8:49 AM

## 2016-05-22 NOTE — Assessment & Plan Note (Signed)
Uncontrolled BP, persistent resistant HTN, compared chart review prior BPs. Does not check at home. Only mild improvement on manual re-check. - Complicated with gradual worsening Cr up to 1.8, also aldosterone lab normal but elevated PRA, concerned for secondary etiology of HTN, as patient seems adherent to medications  Plan: 1. Increase Hydralazine from 25 BID to TID, new rx sent, marked on patient's bottle 2. Continue Carvedilol 12.5mg  BID, Amlodpine 10 - refill sent - limited options, pulse in 60s, decide against increasing BB 3. Keep HOLDing HCTZ 12.5 and Lisinopril 40 given elevated Cr, awaiting Nephrology follow-up 4. Check BP at drug store, write down readings, given log 5. Low sodium DM diet, start more regular exercise with biking, stop caffeine intake 6. Follow-up 3 months BP re-check, follow-up sooner if needed, return criteria given

## 2016-07-23 ENCOUNTER — Telehealth: Payer: Self-pay | Admitting: Family Medicine

## 2016-07-23 DIAGNOSIS — E1142 Type 2 diabetes mellitus with diabetic polyneuropathy: Secondary | ICD-10-CM

## 2016-07-23 MED ORDER — GABAPENTIN 300 MG PO CAPS: ORAL_CAPSULE | ORAL | 11 refills | Status: DC

## 2016-07-23 MED ORDER — LANTUS SOLOSTAR 100 UNIT/ML ~~LOC~~ SOPN: PEN_INJECTOR | SUBCUTANEOUS | 12 refills | Status: DC

## 2016-07-23 MED ORDER — "PEN NEEDLES 5/16"" 31G X 8 MM MISC": 11 refills | Status: DC

## 2016-07-23 NOTE — Telephone Encounter (Signed)
Refills sent for Gabapentin 300mg  takes 3 in AM and 3 in PM for 6 daily total #180 for 30 day supply with +11 refills, sent insulin pen needles, to check CBG up to TID as advised, and refilled Lantus insulin pen same rx as before with refills.  [name], [facility] 07/23/2016, 12:42 PM

## 2016-07-23 NOTE — Telephone Encounter (Signed)
Pt needs refills on gabapentin and insulin pens sent to [facility] in [place].  His call back number is [phone]

## 2016-08-22 ENCOUNTER — Encounter: Payer: Self-pay | Admitting: Family Medicine

## 2016-08-22 ENCOUNTER — Ambulatory Visit (INDEPENDENT_AMBULATORY_CARE_PROVIDER_SITE_OTHER): Payer: Medicaid Other | Admitting: Family Medicine

## 2016-08-22 VITALS — BP 154/82 | HR 66 | Temp 97.9°F | Resp 16 | Ht 68.0 in | Wt 201.0 lb

## 2016-08-22 DIAGNOSIS — I10 Essential (primary) hypertension: Secondary | ICD-10-CM

## 2016-08-22 DIAGNOSIS — N183 Chronic kidney disease, stage 3 unspecified: Secondary | ICD-10-CM

## 2016-08-22 DIAGNOSIS — E1142 Type 2 diabetes mellitus with diabetic polyneuropathy: Secondary | ICD-10-CM

## 2016-08-22 LAB — BASIC METABOLIC PANEL WITH GFR
BUN: 33 mg/dL — ABNORMAL HIGH (ref 7–25)
CALCIUM: 9 mg/dL (ref 8.6–10.3)
CHLORIDE: 102 mmol/L (ref 98–110)
CO2: 22 mmol/L (ref 20–31)
Creat: 1.6 mg/dL — ABNORMAL HIGH (ref 0.70–1.33)
GFR, EST NON AFRICAN AMERICAN: 47 mL/min — AB (ref >=60)
GFR, Est African American: 55 mL/min — ABNORMAL LOW (ref >=60)
Glucose, Bld: 162 mg/dL — ABNORMAL HIGH (ref 65–99)
Potassium: 4.3 mmol/L (ref 3.5–5.3)
SODIUM: 136 mmol/L (ref 135–146)

## 2016-08-22 LAB — POCT GLYCOSYLATED HEMOGLOBIN (HGB A1C): HEMOGLOBIN A1C: 9.3

## 2016-08-22 MED ORDER — AMLODIPINE BESYLATE 10 MG PO TABS
10.0000 mg | ORAL_TABLET | Freq: Every day | ORAL | 3 refills | Status: DC
Start: 2016-08-22 — End: 2016-11-06

## 2016-08-22 MED ORDER — HYDRALAZINE HCL 25 MG PO TABS: 25.0000 mg | ORAL_TABLET | Freq: Four times a day (QID) | ORAL | 5 refills | Status: DC

## 2016-08-22 MED ORDER — EXENATIDE ER 2 MG ~~LOC~~ PEN: 2.0000 mg | PEN_INJECTOR | SUBCUTANEOUS | 5 refills | Status: DC

## 2016-08-22 MED ORDER — GABAPENTIN 400 MG PO CAPS: 800.0000 mg | ORAL_CAPSULE | Freq: Three times a day (TID) | ORAL | 5 refills | Status: DC

## 2016-08-22 NOTE — Progress Notes (Signed)
Subjective:    Patient ID: [name], male    DOB: 09-27-1960, 56 y.o.   MRN: [id number]  [name] is a 56 y.o. male presenting on 08/22/2016 for Diabetes   HPI  CHRONIC RESISTANT HTN / Elevated PRA - Last visit 05/22/16 with follow-up for resistant HTN, he was supposed to see Nephrology as new patient in 06/2016 (referred by previous PCP), due to elevated Cr and abnormal PRA, he had been off of Lisinopril and HCTZ due to these changes also with HyperK. He was increased on Hydralazine from 25mg  BID to TID by me - Today he reports that he did not go to Nephrology apt, and has not scheduled it - He has not checked BP regularly outside office either, he planned to get a cuff to check at home but has not done this - Current Meds - Carvedilol 12.5mg  BID, Hydralazine 25mg  TID, Amlodipine 10mg  daily (ran out of this for past 3+ weeks, had refills but pharmacy wouldn't fill) - HOLDING HCTZ 12.5 and Lisinopril 40 still, does not have these anymore Lifestyle - Active outside but recently cold weather over few months has not been as active, has not started riding bike yet for exercise - He is never smoker but is around a lot of second hand smoke Denies CP, dyspnea, HA, edema, dizziness / lightheadedness  CHRONIC DM, Type 2 with DM Neuropathy Reports recent concerns with poor dietary choices, eating more carbs, dried beans and larger meals, and some sweets. CBGs: Avg 150-160, but does not check CBG regularly only 1-2x weekly, does not have glucometer with him or CBG log Meds: Lantus 38u night, Metformin 1000mg  BID Reports good compliance. Tolerating well w/o side-effects Was on ACEi, now holding due to Cr Lifestyle: Diet (tries to follow DM but admits not adhering to this) Admits worsening burning and tingling in feet bilaterally. Taking Gabapentin 300mg  x 3 capsules TID with some relief but running out of med soon. Denies hypoglycemia, polyuria, visual changes.   Social History    Substance Use Topics  . Smoking status: Passive Smoke Exposure - Never Smoker  . Smokeless tobacco: Never Used  . Alcohol use Yes    Review of Systems Per HPI unless specifically indicated above     Objective:    BP (!) 154/82 (BP Location: Left Arm, Cuff Size: Normal)   Pulse 66   Temp 97.9 F (36.6 C) (Oral)   Resp 16   Ht 5\' 8"  (1.727 m)   Wt 201 lb (91.2 kg)   BMI 30.56 kg/m   Wt Readings from Last 3 Encounters:  08/22/16 201 lb (91.2 kg)  05/22/16 207 lb (93.9 kg)  04/24/16 205 lb (93 kg)    Physical Exam  Constitutional: He is oriented to person, place, and time. He appears well-developed and well-nourished. No distress.  Well-appearing, comfortable, cooperative, smells of smoke (second hand)  HENT:  Head: Normocephalic and atraumatic.  Mouth/Throat: Oropharynx is clear and moist.  Eyes: Right eye exhibits no discharge. Left eye exhibits no discharge.  Mild bilateral conjunctival injection  Neck: Normal range of motion. Neck supple.  Cardiovascular: Normal rate, regular rhythm, normal heart sounds and intact distal pulses.   No murmur heard. Pulmonary/Chest: Effort normal and breath sounds normal. No respiratory distress. He has no wheezes. He has no rales.  Musculoskeletal: He exhibits no edema.  Neurological: He is alert and oriented to person, place, and time.  Skin: Skin is warm and dry. No rash noted. He is  not diaphoretic.  Psychiatric: His behavior is normal.  Nursing note and vitals reviewed.    Results for orders placed or performed in visit on 08/22/16  POCT HgB A1C  Result Value Ref Range   Hemoglobin A1C 9.3       Assessment & Plan:   Problem List Items Addressed This Visit    Resistant hypertension    Still uncontrolled BP, resistant HTN, mild improvement on manual re-check, non adherent to checking outside office, but says will purchase BP cuff - Note he ran out of Amlodipine for 3 weeks, didn't request refill - Complicated with gradual  worsening Cr up to 1.8, also aldosterone lab normal but elevated PRA, concerned for secondary etiology of HTN, as patient seems adherent to medications  Plan: 1. Increase Hydralazine from 25 TID to QID, new rx sent 2. Continue Carvedilol 12.5mg  BID, Amlodpine 10 (refill sent 90 day supply 3 refills) 3. Keep HOLDing HCTZ 12.5 and Lisinopril 40 given elevated Cr, awaiting Nephrology follow-up, now patient needs to re-schedule 4. Check BMET today trend Cr 5. Check BP outside office write down readings, given log 6. Low sodium DM diet, start more regular exercise with biking (encouraged to continue low caffeine) 7. Follow-up 3 months BP re-check, follow-up sooner if needed, return criteria given      Relevant Medications   amLODipine (NORVASC) 10 MG tablet   hydrALAZINE (APRESOLINE) 25 MG tablet   Other Relevant Orders   BASIC METABOLIC PANEL WITH GFR   DM type 2 with diabetic peripheral neuropathy (HCC) - Primary    Significant worsening DM control with A1c 9.3 (up from 7.5, and prior up from about 6.6), likely due to non adherent to DM diet, limited exercise cold weather. Also no longer titrating lantus insulin. - No CBG logs available for me today - Worsening complicated DM neuropathy - Known complication DM nephropathy with CKD-III  Plan: 1. Discussion on Diabetes management today given worsening control, reviewed complications, such as his worsening neuropathy and known nephropathy - counseling on improved diet/lifestyle, smaller portion, lower carbs, start exercise with bike 2. Start new med - Bydureon inj 2mg  weekly #4 pens for 1 month supply with refills, preferred option by Medicaid, advised he can ask pharmacist for demonstration on proper use or can schedule nurse visit in future, reviewed side effects, nausea, local reaction 3. Continue Lantus 38u nightly, titrate up by +1 unit every 3-5 days if fasting CBG in AM >150 consistently, stop at 45u eventually 4. Continue Metformin  1000mg  BID for now, however cautious with prior elevated Cr 5. Check BMET today follow Cr 6. Continue ACEi, ASA, statin 7. Follow-up DM ophtho 8. Increase gabapentin for neuropathy - from 300mg  x3 (900mg ) TID to 400mg  x 2-3 TID ([id number]) 9. Schedule apt with Nephrology for new patient 10. Follow-up 3 months DM A1c      Relevant Medications   Exenatide ER 2 MG PEN   gabapentin (NEURONTIN) 400 MG capsule   Other Relevant Orders   POCT HgB A1C (Completed)   BASIC METABOLIC PANEL WITH GFR   Chronic kidney disease (CKD), stage III (moderate)    Patient did not show to Nephrology apt 06/2016, and did not re-schedule. Chronic elevated Cr, gradual worsening recently with AoCKD with Cr up to 1.8 and GFF < 50 - Continue to hold HCTZ and ACEi - No NSAIDs, improve hydration  Plan: 1. Advised patient to re-schedule with Nephrology for establish CKA - worsening Cr in DM, HTN, also with resistant HTN and  history of elevated PRA. 2. Adjust DM control, add Bydureon 3. Adjust HTN control, increase Hydralazine 4. Check BMET for Cr trend prior to Nephrology       Other Visit Diagnoses    Diabetic polyneuropathy associated with type 2 diabetes mellitus ([place])       Relevant Medications   Exenatide ER 2 MG PEN   gabapentin (NEURONTIN) 400 MG capsule      Meds ordered this encounter  Medications  . amLODipine (NORVASC) 10 MG tablet    Sig: Take 1 tablet (10 mg total) by mouth daily.    Dispense:  90 tablet    Refill:  3  . Exenatide ER 2 MG PEN    Sig: Inject 2 mg into the skin once a week.    Dispense:  4 each    Refill:  5  . gabapentin (NEURONTIN) 400 MG capsule    Sig: Take 2-3 capsules (800-1,200 mg total) by mouth 3 (three) times daily.    Dispense:  270 capsule    Refill:  5  . hydrALAZINE (APRESOLINE) 25 MG tablet    Sig: Take 1 tablet (25 mg total) by mouth 4 (four) times daily.    Dispense:  120 tablet    Refill:  5     Follow up plan: Return in about 3 months (around  11/19/2016) for diabetes, blood pressure.  [name], [facility] 08/22/2016, 4:42 PM

## 2016-08-22 NOTE — Patient Instructions (Addendum)
Thank you for coming in to clinic today.  Please call - to schedule new patient appointment, you were referred before and scheduled for December 2017, but did not show. Let me know if you need a new referral  [facility]) [address] [place], [place] [address] Phone: [phone]   Lavonia Dana, MD -----------  BP is still elevated. - Increase Hydralazine 25mg  to FOUR times a day (instead of 3 a day) - Continue other 2 blood pressure pills, refills sent to pharmacy  Check blood today for Kidney function  - DO NOT RESTART Hydrochlorothiazide or Lisinopril yet, please discuss these medications with your Kidney Doctor  Recommend check BP with cuff at the pharmacy, write down your numbers, check this 1 to 2 times a week between now and next appointment  Congratulations on reducing caffeine (coffee, soda, tea) this will help your BP  Start more regular exercise with bike and walking every day  Check blood sugar TWICE a day, morning fasting, and either 2 hour after lunch or 2 hour after dinner, write down blood sugar readings, bring to next visit  Start Bydureon pen, one injection 2mg  WEEKLY, pick one day to take it. Take with food, can cause some mild nausea.  If Morning fasting blood sugar is >150, then increase Lantus by 1 unit, every 5 days, up to about 45 units, then STOP there.  Please schedule a follow-up appointment with Dr. Parks Ranger in 3 months for Diabetes, A1c, BP check follow-up  If you have any other questions or concerns, please feel free to call the clinic or send a message through [place]. You may also schedule an earlier appointment if necessary.  [name], DO [place]

## 2016-08-22 NOTE — Assessment & Plan Note (Signed)
Still uncontrolled BP, resistant HTN, mild improvement on manual re-check, non adherent to checking outside office, but says will purchase BP cuff - Note he ran out of Amlodipine for 3 weeks, didn't request refill - Complicated with gradual worsening Cr up to 1.8, also aldosterone lab normal but elevated PRA, concerned for secondary etiology of HTN, as patient seems adherent to medications  Plan: 1. Increase Hydralazine from 25 TID to QID, new rx sent 2. Continue Carvedilol 12.5mg  BID, Amlodpine 10 (refill sent 90 day supply 3 refills) 3. Keep HOLDing HCTZ 12.5 and Lisinopril 40 given elevated Cr, awaiting Nephrology follow-up, now patient needs to re-schedule 4. Check BMET today trend Cr 5. Check BP outside office write down readings, given log 6. Low sodium DM diet, start more regular exercise with biking (encouraged to continue low caffeine) 7. Follow-up 3 months BP re-check, follow-up sooner if needed, return criteria given

## 2016-08-22 NOTE — Assessment & Plan Note (Addendum)
Significant worsening DM control with A1c 9.3 (up from 7.5, and prior up from about 6.6), likely due to non adherent to DM diet, limited exercise cold weather. Also no longer titrating lantus insulin. - No CBG logs available for me today - Worsening complicated DM neuropathy - Known complication DM nephropathy with CKD-III  Plan: 1. Discussion on Diabetes management today given worsening control, reviewed complications, such as his worsening neuropathy and known nephropathy - counseling on improved diet/lifestyle, smaller portion, lower carbs, start exercise with bike 2. Start new med - Bydureon inj 2mg  weekly #4 pens for 1 month supply with refills, preferred option by Medicaid, advised he can ask pharmacist for demonstration on proper use or can schedule nurse visit in future, reviewed side effects, nausea, local reaction 3. Continue Lantus 38u nightly, titrate up by +1 unit every 3-5 days if fasting CBG in AM >150 consistently, stop at 45u eventually 4. Continue Metformin 1000mg  BID for now, however cautious with prior elevated Cr 5. Check BMET today follow Cr 6. Continue ACEi, ASA, statin 7. Follow-up DM ophtho 8. Increase gabapentin for neuropathy - from 300mg  x3 (900mg ) TID to 400mg  x 2-3 TID ([id number]) 9. Schedule apt with Nephrology for new patient 10. Follow-up 3 months DM A1c

## 2016-08-22 NOTE — Assessment & Plan Note (Addendum)
Patient did not show to Nephrology apt 06/2016, and did not re-schedule. Chronic elevated Cr, gradual worsening recently with AoCKD with Cr up to 1.8 and GFF < 50 - Continue to hold HCTZ and ACEi - No NSAIDs, improve hydration  Plan: 1. Advised patient to re-schedule with Nephrology for establish CKA - worsening Cr in DM, HTN, also with resistant HTN and history of elevated PRA. 2. Adjust DM control, add Bydureon 3. Adjust HTN control, increase Hydralazine 4. Check BMET for Cr trend prior to Nephrology

## 2016-09-12 ENCOUNTER — Other Ambulatory Visit: Payer: Self-pay | Admitting: Family Medicine

## 2016-09-12 DIAGNOSIS — E1142 Type 2 diabetes mellitus with diabetic polyneuropathy: Secondary | ICD-10-CM

## 2016-09-12 MED ORDER — GLUCOSE BLOOD VI STRP: ORAL_STRIP | 12 refills | Status: DC

## 2016-10-03 DIAGNOSIS — E1122 Type 2 diabetes mellitus with diabetic chronic kidney disease: Secondary | ICD-10-CM | POA: Diagnosis not present

## 2016-10-03 DIAGNOSIS — N183 Chronic kidney disease, stage 3 (moderate): Secondary | ICD-10-CM | POA: Diagnosis not present

## 2016-10-03 DIAGNOSIS — I10 Essential (primary) hypertension: Secondary | ICD-10-CM | POA: Diagnosis not present

## 2016-10-03 DIAGNOSIS — R809 Proteinuria, unspecified: Secondary | ICD-10-CM | POA: Diagnosis not present

## 2016-10-04 ENCOUNTER — Other Ambulatory Visit (HOSPITAL_COMMUNITY): Payer: Self-pay | Admitting: Nephrology

## 2016-10-04 ENCOUNTER — Other Ambulatory Visit: Payer: Self-pay | Admitting: Nephrology

## 2016-10-04 DIAGNOSIS — N181 Chronic kidney disease, stage 1: Secondary | ICD-10-CM

## 2016-10-10 ENCOUNTER — Ambulatory Visit: Payer: Medicaid Other

## 2016-10-13 DIAGNOSIS — I5022 Chronic systolic (congestive) heart failure: Secondary | ICD-10-CM | POA: Insufficient documentation

## 2016-10-13 DIAGNOSIS — I5042 Chronic combined systolic (congestive) and diastolic (congestive) heart failure: Secondary | ICD-10-CM

## 2016-10-13 HISTORY — DX: Chronic combined systolic (congestive) and diastolic (congestive) heart failure: I50.42

## 2016-10-17 ENCOUNTER — Ambulatory Visit
Admission: RE | Admit: 2016-10-17 | Discharge: 2016-10-17 | Disposition: A | Discharge status: Home / Self Care | Payer: Medicaid Other | Source: Ambulatory Visit | Attending: Nephrology | Admitting: Nephrology

## 2016-10-17 DIAGNOSIS — N181 Chronic kidney disease, stage 1: Secondary | ICD-10-CM

## 2016-10-18 ENCOUNTER — Emergency Department: Payer: Medicaid Other

## 2016-10-18 ENCOUNTER — Inpatient Hospital Stay
Admission: EM | Admit: 2016-10-18 | Discharge: 2016-10-22 | DRG: 246 | Disposition: A | Discharge status: Home / Self Care | Payer: Medicaid Other | Attending: Internal Medicine | Admitting: Internal Medicine

## 2016-10-18 ENCOUNTER — Encounter: Payer: Self-pay | Admitting: Internal Medicine

## 2016-10-18 DIAGNOSIS — R748 Abnormal levels of other serum enzymes: Secondary | ICD-10-CM | POA: Diagnosis not present

## 2016-10-18 DIAGNOSIS — I5041 Acute combined systolic (congestive) and diastolic (congestive) heart failure: Secondary | ICD-10-CM | POA: Diagnosis present

## 2016-10-18 DIAGNOSIS — Z7984 Long term (current) use of oral hypoglycemic drugs: Secondary | ICD-10-CM

## 2016-10-18 DIAGNOSIS — I5021 Acute systolic (congestive) heart failure: Secondary | ICD-10-CM

## 2016-10-18 DIAGNOSIS — Z8249 Family history of ischemic heart disease and other diseases of the circulatory system: Secondary | ICD-10-CM

## 2016-10-18 DIAGNOSIS — I248 Other forms of acute ischemic heart disease: Secondary | ICD-10-CM | POA: Diagnosis present

## 2016-10-18 DIAGNOSIS — Z888 Allergy status to other drugs, medicaments and biological substances status: Secondary | ICD-10-CM

## 2016-10-18 DIAGNOSIS — E1169 Type 2 diabetes mellitus with other specified complication: Secondary | ICD-10-CM | POA: Diagnosis present

## 2016-10-18 DIAGNOSIS — F1010 Alcohol abuse, uncomplicated: Secondary | ICD-10-CM | POA: Diagnosis present

## 2016-10-18 DIAGNOSIS — I200 Unstable angina: Secondary | ICD-10-CM

## 2016-10-18 DIAGNOSIS — J9601 Acute respiratory failure with hypoxia: Secondary | ICD-10-CM

## 2016-10-18 DIAGNOSIS — Z7982 Long term (current) use of aspirin: Secondary | ICD-10-CM | POA: Diagnosis not present

## 2016-10-18 DIAGNOSIS — I110 Hypertensive heart disease with heart failure: Secondary | ICD-10-CM | POA: Diagnosis not present

## 2016-10-18 DIAGNOSIS — E785 Hyperlipidemia, unspecified: Secondary | ICD-10-CM | POA: Diagnosis present

## 2016-10-18 DIAGNOSIS — E876 Hypokalemia: Secondary | ICD-10-CM | POA: Diagnosis present

## 2016-10-18 DIAGNOSIS — E1122 Type 2 diabetes mellitus with diabetic chronic kidney disease: Secondary | ICD-10-CM | POA: Diagnosis present

## 2016-10-18 DIAGNOSIS — I129 Hypertensive chronic kidney disease with stage 1 through stage 4 chronic kidney disease, or unspecified chronic kidney disease: Secondary | ICD-10-CM | POA: Diagnosis present

## 2016-10-18 DIAGNOSIS — Z9889 Other specified postprocedural states: Secondary | ICD-10-CM

## 2016-10-18 DIAGNOSIS — I130 Hypertensive heart and chronic kidney disease with heart failure and stage 1 through stage 4 chronic kidney disease, or unspecified chronic kidney disease: Principal | ICD-10-CM | POA: Diagnosis present

## 2016-10-18 DIAGNOSIS — I10 Essential (primary) hypertension: Secondary | ICD-10-CM | POA: Diagnosis not present

## 2016-10-18 DIAGNOSIS — Z79899 Other long term (current) drug therapy: Secondary | ICD-10-CM

## 2016-10-18 DIAGNOSIS — R7989 Other specified abnormal findings of blood chemistry: Secondary | ICD-10-CM

## 2016-10-18 DIAGNOSIS — I420 Dilated cardiomyopathy: Secondary | ICD-10-CM | POA: Diagnosis present

## 2016-10-18 DIAGNOSIS — I5042 Chronic combined systolic (congestive) and diastolic (congestive) heart failure: Secondary | ICD-10-CM | POA: Diagnosis not present

## 2016-10-18 DIAGNOSIS — Z7722 Contact with and (suspected) exposure to environmental tobacco smoke (acute) (chronic): Secondary | ICD-10-CM | POA: Diagnosis present

## 2016-10-18 DIAGNOSIS — I25110 Atherosclerotic heart disease of native coronary artery with unstable angina pectoris: Secondary | ICD-10-CM | POA: Diagnosis not present

## 2016-10-18 DIAGNOSIS — E1142 Type 2 diabetes mellitus with diabetic polyneuropathy: Secondary | ICD-10-CM | POA: Diagnosis present

## 2016-10-18 DIAGNOSIS — Z794 Long term (current) use of insulin: Secondary | ICD-10-CM | POA: Diagnosis not present

## 2016-10-18 DIAGNOSIS — I5023 Acute on chronic systolic (congestive) heart failure: Secondary | ICD-10-CM

## 2016-10-18 DIAGNOSIS — I447 Left bundle-branch block, unspecified: Secondary | ICD-10-CM | POA: Diagnosis present

## 2016-10-18 DIAGNOSIS — N183 Chronic kidney disease, stage 3 (moderate): Secondary | ICD-10-CM | POA: Diagnosis present

## 2016-10-18 DIAGNOSIS — I5040 Unspecified combined systolic (congestive) and diastolic (congestive) heart failure: Secondary | ICD-10-CM

## 2016-10-18 DIAGNOSIS — T501X5A Adverse effect of loop [high-ceiling] diuretics, initial encounter: Secondary | ICD-10-CM | POA: Diagnosis present

## 2016-10-18 DIAGNOSIS — R778 Other specified abnormalities of plasma proteins: Secondary | ICD-10-CM

## 2016-10-18 DIAGNOSIS — N184 Chronic kidney disease, stage 4 (severe): Secondary | ICD-10-CM | POA: Diagnosis present

## 2016-10-18 HISTORY — DX: Type 2 diabetes mellitus with diabetic neuropathy, unspecified: E11.40

## 2016-10-18 HISTORY — DX: Chronic kidney disease, stage 3 (moderate): N18.3

## 2016-10-18 LAB — BRAIN NATRIURETIC PEPTIDE: B Natriuretic Peptide: 541 pg/mL — ABNORMAL HIGH (ref 0.0–100.0)

## 2016-10-18 LAB — CBC WITH DIFFERENTIAL/PLATELET
BASOS ABS: 0.1 10*3/uL (ref 0–0.1)
BASOS PCT: 1 %
EOS ABS: 0.4 10*3/uL (ref 0–0.7)
Eosinophils Relative: 5 %
HEMATOCRIT: 38.3 % — AB (ref 40.0–52.0)
Hemoglobin: 13.4 g/dL (ref 13.0–18.0)
Lymphocytes Relative: 21 %
Lymphs Abs: 1.6 10*3/uL (ref 1.0–3.6)
MCH: 33.9 pg (ref 26.0–34.0)
MCHC: 34.9 g/dL (ref 32.0–36.0)
MCV: 97 fL (ref 80.0–100.0)
MONO ABS: 0.8 10*3/uL (ref 0.2–1.0)
Monocytes Relative: 11 %
NEUTROS ABS: 4.7 10*3/uL (ref 1.4–6.5)
Neutrophils Relative %: 62 %
PLATELETS: 196 10*3/uL (ref 150–440)
RBC: 3.95 MIL/uL — ABNORMAL LOW (ref 4.40–5.90)
RDW: 12.8 % (ref 11.5–14.5)
WBC: 7.6 10*3/uL (ref 3.8–10.6)

## 2016-10-18 LAB — COMPREHENSIVE METABOLIC PANEL
ALBUMIN: 3.8 g/dL (ref 3.5–5.0)
ALT: 27 U/L (ref 17–63)
AST: 25 U/L (ref 15–41)
Alkaline Phosphatase: 58 U/L (ref 38–126)
Anion gap: 8 (ref 5–15)
BILIRUBIN TOTAL: 1 mg/dL (ref 0.3–1.2)
BUN: 23 mg/dL — AB (ref 6–20)
CHLORIDE: 104 mmol/L (ref 101–111)
CO2: 26 mmol/L (ref 22–32)
Calcium: 9.4 mg/dL (ref 8.9–10.3)
Creatinine, Ser: 1.5 mg/dL — ABNORMAL HIGH (ref 0.61–1.24)
GFR calc Af Amer: 58 mL/min — ABNORMAL LOW (ref >60)
GFR calc non Af Amer: 50 mL/min — ABNORMAL LOW (ref >60)
GLUCOSE: 163 mg/dL — AB (ref 65–99)
POTASSIUM: 3.9 mmol/L (ref 3.5–5.1)
SODIUM: 138 mmol/L (ref 135–145)
Total Protein: 7.5 g/dL (ref 6.5–8.1)

## 2016-10-18 LAB — GLUCOSE, CAPILLARY: Glucose-Capillary: 146 mg/dL — ABNORMAL HIGH (ref 65–99)

## 2016-10-18 LAB — TROPONIN I
Troponin I: <0.03 ng/mL (ref <0.03)
Troponin I: 0.03 ng/mL (ref <0.03)

## 2016-10-18 MED ORDER — LORAZEPAM 1 MG PO TABS: 1.0000 mg | ORAL_TABLET | Freq: Four times a day (QID) | ORAL | Status: AC | PRN

## 2016-10-18 MED ORDER — ONDANSETRON HCL 4 MG PO TABS: 4.0000 mg | ORAL_TABLET | Freq: Four times a day (QID) | ORAL | Status: DC | PRN

## 2016-10-18 MED ORDER — ADULT MULTIVITAMIN W/MINERALS CH
1.0000 | ORAL_TABLET | Freq: Every day | ORAL | Status: DC
  Administered 2016-10-19 – 2016-10-22 (×4): 1 via ORAL
  Filled 2016-10-18 (×4): qty 1

## 2016-10-18 MED ORDER — FOLIC ACID 1 MG PO TABS
1.0000 mg | ORAL_TABLET | Freq: Every day | ORAL | Status: DC
  Administered 2016-10-19 – 2016-10-22 (×4): 1 mg via ORAL
  Filled 2016-10-18 (×4): qty 1

## 2016-10-18 MED ORDER — FUROSEMIDE 10 MG/ML IJ SOLN
INTRAMUSCULAR | Status: AC
  Filled 2016-10-18: qty 10

## 2016-10-18 MED ORDER — THIAMINE HCL 100 MG/ML IJ SOLN: 100.0000 mg | Freq: Every day | INTRAMUSCULAR | Status: DC

## 2016-10-18 MED ORDER — INSULIN ASPART 100 UNIT/ML ~~LOC~~ SOLN
0.0000 [IU] | Freq: Every day | SUBCUTANEOUS | Status: DC
  Administered 2016-10-20: 1 [IU] via SUBCUTANEOUS

## 2016-10-18 MED ORDER — SODIUM CHLORIDE 0.9% FLUSH
3.0000 mL | Freq: Two times a day (BID) | INTRAVENOUS | Status: DC
  Administered 2016-10-18 – 2016-10-22 (×8): 3 mL via INTRAVENOUS

## 2016-10-18 MED ORDER — LORAZEPAM 1 MG PO TABS
0.0000 mg | ORAL_TABLET | Freq: Two times a day (BID) | ORAL | Status: DC
  Administered 2016-10-21: 1 mg via ORAL
  Filled 2016-10-18: qty 2

## 2016-10-18 MED ORDER — INSULIN ASPART 100 UNIT/ML ~~LOC~~ SOLN
0.0000 [IU] | Freq: Three times a day (TID) | SUBCUTANEOUS | Status: DC
Start: 2016-10-19 — End: 2016-10-22
  Administered 2016-10-19: 2 [IU] via SUBCUTANEOUS
  Administered 2016-10-19: 1 [IU] via SUBCUTANEOUS
  Administered 2016-10-19: 2 [IU] via SUBCUTANEOUS
  Administered 2016-10-20 (×3): 1 [IU] via SUBCUTANEOUS
  Filled 2016-10-18: qty 1
  Filled 2016-10-18: qty 2
  Filled 2016-10-18 (×2): qty 1
  Filled 2016-10-18: qty 2
  Filled 2016-10-18: qty 1

## 2016-10-18 MED ORDER — VITAMIN B-1 100 MG PO TABS
100.0000 mg | ORAL_TABLET | Freq: Every day | ORAL | Status: DC
  Administered 2016-10-19 – 2016-10-22 (×4): 100 mg via ORAL
  Filled 2016-10-18 (×4): qty 1

## 2016-10-18 MED ORDER — ACETAMINOPHEN 325 MG PO TABS: 650.0000 mg | ORAL_TABLET | Freq: Four times a day (QID) | ORAL | Status: DC | PRN

## 2016-10-18 MED ORDER — FUROSEMIDE 10 MG/ML IJ SOLN
60.0000 mg | Freq: Once | INTRAMUSCULAR | Status: AC
  Administered 2016-10-18: 60 mg via INTRAVENOUS
  Filled 2016-10-18 (×2): qty 6

## 2016-10-18 MED ORDER — ENOXAPARIN SODIUM 40 MG/0.4ML ~~LOC~~ SOLN
40.0000 mg | SUBCUTANEOUS | Status: DC
  Administered 2016-10-19 – 2016-10-21 (×3): 40 mg via SUBCUTANEOUS
  Filled 2016-10-18 (×3): qty 0.4

## 2016-10-18 MED ORDER — LORAZEPAM 1 MG PO TABS: 0.0000 mg | ORAL_TABLET | Freq: Four times a day (QID) | ORAL | Status: AC

## 2016-10-18 MED ORDER — LORAZEPAM 2 MG/ML IJ SOLN: 1.0000 mg | Freq: Four times a day (QID) | INTRAMUSCULAR | Status: AC | PRN

## 2016-10-18 MED ORDER — ONDANSETRON HCL 4 MG/2ML IJ SOLN: 4.0000 mg | Freq: Four times a day (QID) | INTRAMUSCULAR | Status: DC | PRN

## 2016-10-18 MED ORDER — ORAL CARE MOUTH RINSE
15.0000 mL | Freq: Two times a day (BID) | OROMUCOSAL | Status: DC
  Administered 2016-10-18 – 2016-10-21 (×2): 15 mL via OROMUCOSAL

## 2016-10-18 MED ORDER — ACETAMINOPHEN 650 MG RE SUPP: 650.0000 mg | Freq: Four times a day (QID) | RECTAL | Status: DC | PRN

## 2016-10-18 NOTE — ED Notes (Addendum)
Erroneous charting

## 2016-10-18 NOTE — ED Provider Notes (Signed)
[facility] Emergency Department Provider Note    None    (approximate)  I have reviewed the triage vital signs and the nursing notes.   HISTORY  Chief Complaint Foot Swelling    HPI [name] is a very pleasant 56 y.o. male long-standing history of hypertension and diabetes presents for evaluation of several weeks of bilateral foot swelling accompanied by shortness of breath. Patient states the particular the past 2 weeks she's noted worsening swelling in his legs. States he becomes very short of breath on laying flat and any exertion causes shortness of breath. He states that he is now having to take breaks walking down the hall. He is sleeping on several pillows at home. Route had recent increase in his hydralazine due to persistently elevated blood pressure. Denies any chest pain.   Past Medical History:  Diagnosis Date  . Ankle pain   . Diabetes mellitus without complication ([place])   . Hyperlipidemia   . Hypertension   . Pain in both feet    Family History  Problem Relation Age of Onset  . Hypertension Mother   . Heart disease Maternal Grandfather    Past Surgical History:  Procedure Laterality Date  . TIBIA FRACTURE SURGERY Right 2002   Patient Active Problem List   Diagnosis Date Noted  . Hyperlipidemia 01/22/2016  . Chronic kidney disease (CKD), stage III (moderate) 06/15/2015  . DM type 2 with diabetic peripheral neuropathy ([place]) 08/27/2013  . AA (alcohol abuse) 05/18/2012  . Osteoarthritis of ankle and foot 05/18/2012  . Bursitis of elbow 01/14/2012  . Resistant hypertension 09/20/2007      Prior to Admission medications   Medication Sig Start Date End Date Taking? Authorizing Provider  acetaminophen (TYLENOL 8 HOUR) 650 MG CR tablet Take 1 tablet (650 mg total) by mouth every 8 (eight) hours as needed for pain. 08/28/15   Amy Overton Mam, NP  amLODipine (NORVASC) 10 MG tablet Take 1 tablet (10 mg total) by mouth daily.  08/22/16   Olin Hauser, DO  aspirin EC 81 MG tablet Take 81 mg by mouth. 09/22/07   Historical Provider, MD  atorvastatin (LIPITOR) 20 MG tablet Take 1 tablet (20 mg total) by mouth daily. 01/22/16   Amy Overton Mam, NP  Blood Pressure Monitoring (BLOOD PRESSURE CUFF) MISC 1 each by Does not apply route daily. 02/12/16   Amy Lauren Krebs, NP  capsaicin (ZOSTRIX) 0.025 % cream Apply topically 2 (two) times daily. 11/17/15   Amy Overton Mam, NP  carvedilol (COREG) 12.5 MG tablet Take 1 tablet (12.5 mg total) by mouth 2 (two) times daily with a meal. 01/22/16   Amy Overton Mam, NP  DULoxetine (CYMBALTA) 60 MG capsule Take 1 capsule (60 mg total) by mouth daily. 03/14/16   Amy Overton Mam, NP  Exenatide ER 2 MG PEN Inject 2 mg into the skin once a week. 08/22/16   Olin Hauser, DO  gabapentin (NEURONTIN) 400 MG capsule Take 2-3 capsules (800-1,200 mg total) by mouth 3 (three) times daily. 08/22/16   Olin Hauser, DO  glucose blood test strip Use as instructed 09/12/16   Olin Hauser, DO  hydrALAZINE (APRESOLINE) 25 MG tablet Take 1 tablet (25 mg total) by mouth 4 (four) times daily. 08/22/16   Olin Hauser, DO  hydrochlorothiazide (HYDRODIURIL) 12.5 MG tablet TAKE 1 TABLET (12.5 MG TOTAL) BY MOUTH DAILY. 04/09/16   Historical Provider, MD  Insulin Pen Needle (PEN NEEDLES 31GX5/16") 31G  X 8 MM MISC Check blood sugar up to 3 times daily as advised. 07/23/16   Olin Hauser, DO  Iron-Vitamins (GERITOL PO) Take 1 tablet by mouth. Pt takes occasionally    Historical Provider, MD  LANTUS SOLOSTAR 100 UNIT/ML Solostar Pen Inject 36 units under the skin bedtime daily. Increase by 2 units per week until fasting blood sugars are <130. 07/23/16   Olin Hauser, DO  metFORMIN (GLUCOPHAGE) 1000 MG tablet Take 1 tablet (1,000 mg total) by mouth 2 (two) times daily with a meal. 01/22/16 01/21/17  Amy Overton Mam, NP    Allergies Terbinafine and  related    Social History Social History  Substance Use Topics  . Smoking status: Passive Smoke Exposure - Never Smoker  . Smokeless tobacco: Never Used  . Alcohol use Yes    Review of Systems Patient denies headaches, rhinorrhea, blurry vision, numbness, shortness of breath, chest pain, edema, cough, abdominal pain, nausea, vomiting, diarrhea, dysuria, fevers, rashes or hallucinations unless otherwise stated above in HPI. ____________________________________________   PHYSICAL EXAM:  VITAL SIGNS: Vitals:   10/18/16 1902 10/18/16 2019  BP: (!) 184/90   Pulse: 86 86  Resp: 16 16  Temp: 98.8 F (37.1 C)     Constitutional: Alert and oriented. Well appearing and in no acute distress. Eyes: Conjunctivae are normal. PERRL. EOMI. Head: Atraumatic. Nose: No congestion/rhinnorhea. Mouth/Throat: Mucous membranes are moist.  Oropharynx non-erythematous. Neck: No stridor. Painless ROM. No cervical spine tenderness to palpation Hematological/Lymphatic/Immunilogical: No cervical lymphadenopathy. Cardiovascular: Normal rate, regular rhythm. Grossly normal heart sounds.  Good peripheral circulation. Respiratory: Normal respiratory effort.  No retractions. Lungs with inspiratory crackles to mid posterior thorax Gastrointestinal: Soft and nontender. No distention. No abdominal bruits. No CVA tenderness. Genitourinary:  Musculoskeletal: No lower extremity tenderness, 2+r edema.  No joint effusions. Neurologic:  Normal speech and language. No gross focal neurologic deficits are appreciated. No gait instability. Skin:  Skin is warm, dry and intact. No rash noted. Psychiatric: Mood and affect are normal. Speech and behavior are normal.  ____________________________________________   LABS (all labs ordered are listed, but only abnormal results are displayed)  Results for orders placed or performed during the hospital encounter of 10/18/16 (from the past 24 hour(s))  CBC with Differential      Status: Abnormal   Collection Time: 10/18/16  7:09 PM  Result Value Ref Range   WBC 7.6 3.8 - 10.6 K/uL   RBC 3.95 (L) 4.40 - 5.90 MIL/uL   Hemoglobin 13.4 13.0 - 18.0 g/dL   HCT 38.3 (L) 40.0 - 52.0 %   MCV 97.0 80.0 - 100.0 fL   MCH 33.9 26.0 - 34.0 pg   MCHC 34.9 32.0 - 36.0 g/dL   RDW 12.8 11.5 - 14.5 %   Platelets 196 150 - 440 K/uL   Neutrophils Relative % 62 %   Neutro Abs 4.7 1.4 - 6.5 K/uL   Lymphocytes Relative 21 %   Lymphs Abs 1.6 1.0 - 3.6 K/uL   Monocytes Relative 11 %   Monocytes Absolute 0.8 0.2 - 1.0 K/uL   Eosinophils Relative 5 %   Eosinophils Absolute 0.4 0 - 0.7 K/uL   Basophils Relative 1 %   Basophils Absolute 0.1 0 - 0.1 K/uL  Comprehensive metabolic panel     Status: Abnormal   Collection Time: 10/18/16  7:09 PM  Result Value Ref Range   Sodium 138 135 - 145 mmol/L   Potassium 3.9 3.5 - 5.1 mmol/L  Chloride 104 101 - 111 mmol/L   CO2 26 22 - 32 mmol/L   Glucose, Bld 163 (H) 65 - 99 mg/dL   BUN 23 (H) 6 - 20 mg/dL   Creatinine, Ser 1.50 (H) 0.61 - 1.24 mg/dL   Calcium 9.4 8.9 - 10.3 mg/dL   Total Protein 7.5 6.5 - 8.1 g/dL   Albumin 3.8 3.5 - 5.0 g/dL   AST 25 15 - 41 U/L   ALT 27 17 - 63 U/L   Alkaline Phosphatase 58 38 - 126 U/L   Total Bilirubin 1.0 0.3 - 1.2 mg/dL   GFR calc non Af Amer 50 (L) >60 mL/min   GFR calc Af Amer 58 (L) >60 mL/min   Anion gap 8 5 - 15  Troponin I     Status: None   Collection Time: 10/18/16  7:09 PM  Result Value Ref Range   Troponin I <0.03 <0.03 ng/mL  Brain natriuretic peptide     Status: Abnormal   Collection Time: 10/18/16  7:09 PM  Result Value Ref Range   B Natriuretic Peptide 541.0 (H) 0.0 - 100.0 pg/mL   ____________________________________________  EKG My review and personal interpretation at Time: 19:12   Indication: sob  Rate: 85  Rhythm: sinus Axis: left Other: lbbb, no sgarbossa criteria, normal intervals ____________________________________________  RADIOLOGY  I personally reviewed  all radiographic images ordered to evaluate for the above acute complaints and reviewed radiology reports and findings.  These findings were personally discussed with the patient.  Please see medical record for radiology report.  ____________________________________________   PROCEDURES  Procedure(s) performed:  Procedures    Critical Care performed: yes CRITICAL CARE Performed by: Merlyn Lot   Total critical care time: 30 minutes  Critical care time was exclusive of separately billable procedures and treating other patients.  Critical care was necessary to treat or prevent imminent or life-threatening deterioration.  Critical care was time spent personally by me on the following activities: development of treatment plan with patient and/or surrogate as well as nursing, discussions with consultants, evaluation of patient's response to treatment, examination of patient, obtaining history from patient or surrogate, ordering and performing treatments and interventions, ordering and review of laboratory studies, ordering and review of radiographic studies, pulse oximetry and re-evaluation of patient's condition.  ____________________________________________   INITIAL IMPRESSION / ASSESSMENT AND PLAN / ED COURSE  Pertinent labs & imaging results that were available during my care of the patient were reviewed by me and considered in my medical decision making (see chart for details).  DDX: Asthma, copd, CHF, pna, ptx, malignancy, Pe, anemia   [name] is a 56 y.o. who presents to the ED with shortness of breath and leg swelling as described above. Patient arrives with acute respiratory failure with hypoxia. Short of breath and O2 saturation drops down to 87%. Chest x-ray shows evidence of pulmonary edema and borderline cardiomegaly. Exam with bilateral lower extremity edema also consistent with heart failure. EKG shows no acute changes, left bundle branch block consistent  with previous and the patient denies any chest pain. His troponin is normal but his BNP is elevated. Does have mild chronic kidney disease. Based on his acute hypoxia will give Lasix to treat both blood pressure and pulmonary edema. Patient will require admission for further evaluation and management.  Have discussed with the patient and available family all diagnostics and treatments performed thus far and all questions were answered to the best of my ability. The patient demonstrates  understanding and agreement with plan.       ____________________________________________   FINAL CLINICAL IMPRESSION(S) / ED DIAGNOSES  Final diagnoses:  Acute respiratory failure with hypoxia (HCC)  Combined systolic and diastolic congestive heart failure, unspecified congestive heart failure chronicity ([place])      NEW MEDICATIONS STARTED DURING THIS VISIT:  New Prescriptions   No medications on file     Note:  This document was prepared using Dragon voice recognition software and may include unintentional dictation errors.    Merlyn Lot, MD 10/18/16 2047

## 2016-10-18 NOTE — ED Notes (Signed)
MD notified of oxygen level.

## 2016-10-18 NOTE — H&P (Signed)
[place] [facility] [place] NAME: [name]    MR#:  [id number]  DATE OF BIRTH:  12/09/1960  DATE OF ADMISSION:  10/18/2016  PRIMARY CARE PHYSICIAN: [name], DO   REQUESTING/REFERRING PHYSICIAN: Quentin Cornwall, MD  CHIEF COMPLAINT:   Chief Complaint  Patient presents with  . Foot Swelling    HISTORY OF PRESENT ILLNESS:  [name]  is a 56 y.o. male who presents with Extreme edema, increasing shortness of breath, orthopnea. Patient states over the past 2 weeks he has had persistent lower extremity edema, and for the past several days he's had orthopnea as well as shortness of breath with significant exertion. Here in the ED he has an elevated BNP, in the 500s. Clinically, his symptoms fit with heart failure. He has no prior diagnosis of heart failure. His EKG does have a left bundle-branch block, though he denies any prior diagnosis of CAD or heart attack. His left bundle is not new. Hospitalists were called for admission and further evaluation  PAST MEDICAL HISTORY:   Past Medical History:  Diagnosis Date  . Ankle pain   . Diabetes mellitus without complication ([place])   . Hyperlipidemia   . Hypertension   . Pain in both feet     PAST SURGICAL HISTORY:   Past Surgical History:  Procedure Laterality Date  . TIBIA FRACTURE SURGERY Right 2002    SOCIAL HISTORY:   Social History  Substance Use Topics  . Smoking status: Passive Smoke Exposure - Never Smoker  . Smokeless tobacco: Never Used  . Alcohol use Yes    FAMILY HISTORY:   Family History  Problem Relation Age of Onset  . Hypertension Mother   . Heart disease Maternal Grandfather     DRUG ALLERGIES:   Allergies  Allergen Reactions  . Terbinafine And Related Rash    MEDICATIONS AT HOME:   Prior to Admission medications   Medication Sig Start Date End Date Taking? Authorizing Provider  acetaminophen (TYLENOL 8 HOUR) 650 MG CR tablet Take 1  tablet (650 mg total) by mouth every 8 (eight) hours as needed for pain. 08/28/15  Yes Amy Overton Mam, NP  Alpha-Lipoic Acid 600 MG CAPS Take 1 capsule by mouth daily.   Yes Historical Provider, MD  amLODipine (NORVASC) 10 MG tablet Take 1 tablet (10 mg total) by mouth daily. 08/22/16  Yes Olin Hauser, DO  aspirin EC 81 MG tablet Take 81 mg by mouth. 09/22/07  Yes Historical Provider, MD  atorvastatin (LIPITOR) 20 MG tablet Take 1 tablet (20 mg total) by mouth daily. 01/22/16  Yes Amy Overton Mam, NP  carvedilol (COREG) 12.5 MG tablet Take 1 tablet (12.5 mg total) by mouth 2 (two) times daily with a meal. 01/22/16  Yes Amy Overton Mam, NP  gabapentin (NEURONTIN) 400 MG capsule Take 2-3 capsules (800-1,200 mg total) by mouth 3 (three) times daily. 08/22/16  Yes Olin Hauser, DO  hydrALAZINE (APRESOLINE) 25 MG tablet Take 1 tablet (25 mg total) by mouth 4 (four) times daily. 08/22/16  Yes Alexander J Karamalegos, DO  LANTUS SOLOSTAR 100 UNIT/ML Solostar Pen Inject 36 units under the skin bedtime daily. Increase by 2 units per week until fasting blood sugars are <130. Patient taking differently: Inject 40 Units into the skin at bedtime.  07/23/16  Yes Olin Hauser, DO  metFORMIN (GLUCOPHAGE) 1000 MG tablet Take 1 tablet (1,000 mg total) by mouth 2 (two) times daily with a meal. 01/22/16  01/21/17 Yes Amy Overton Mam, NP  Blood Pressure Monitoring (BLOOD PRESSURE CUFF) MISC 1 each by Does not apply route daily. 02/12/16   Amy Lauren Krebs, NP  capsaicin (ZOSTRIX) 0.025 % cream Apply topically 2 (two) times daily. Patient not taking: Reported on 10/18/2016 11/17/15   Amy Overton Mam, NP  DULoxetine (CYMBALTA) 60 MG capsule Take 1 capsule (60 mg total) by mouth daily. Patient not taking: Reported on 10/18/2016 03/14/16   Amy Overton Mam, NP  Exenatide ER 2 MG PEN Inject 2 mg into the skin once a week. Patient not taking: Reported on 10/18/2016 08/22/16   Olin Hauser, DO  glucose  blood test strip Use as instructed 09/12/16   Olin Hauser, DO  Insulin Pen Needle (PEN NEEDLES 31GX5/16") 31G X 8 MM MISC Check blood sugar up to 3 times daily as advised. 07/23/16   Olin Hauser, DO    REVIEW OF SYSTEMS:  Review of Systems  Constitutional: Negative for chills, fever, malaise/fatigue and weight loss.  HENT: Negative for ear pain, hearing loss and tinnitus.   Eyes: Negative for blurred vision, double vision, pain and redness.  Respiratory: Positive for shortness of breath. Negative for cough and hemoptysis.   Cardiovascular: Positive for orthopnea and leg swelling. Negative for chest pain and palpitations.  Gastrointestinal: Negative for abdominal pain, constipation, diarrhea, nausea and vomiting.  Genitourinary: Negative for dysuria, frequency and hematuria.  Musculoskeletal: Negative for back pain, joint pain and neck pain.  Skin:       No acne, rash, or lesions  Neurological: Negative for dizziness, tremors, focal weakness and weakness.  Endo/Heme/Allergies: Negative for polydipsia. Does not bruise/bleed easily.  Psychiatric/Behavioral: Negative for depression. The patient is not nervous/anxious and does not have insomnia.      VITAL SIGNS:   Vitals:   10/18/16 1902 10/18/16 2019 10/18/16 2045  BP: (!) 184/90    Pulse: 86 86   Resp: 16 16   Temp: 98.8 F (37.1 C)    TempSrc: Oral    SpO2: 94% (!) 87% 92%  Weight: 95.7 kg (211 lb)    Height: 5\' 9"  (1.753 m)     Wt Readings from Last 3 Encounters:  10/18/16 95.7 kg (211 lb)  08/22/16 91.2 kg (201 lb)  05/22/16 93.9 kg (207 lb)    PHYSICAL EXAMINATION:  Physical Exam  Vitals reviewed. Constitutional: He is oriented to person, place, and time. He appears well-developed and well-nourished. No distress.  HENT:  Head: Normocephalic and atraumatic.  Mouth/Throat: Oropharynx is clear and moist.  Eyes: Conjunctivae and EOM are normal. Pupils are equal, round, and reactive to light. No  scleral icterus.  Neck: Normal range of motion. Neck supple. No JVD present. No thyromegaly present.  Cardiovascular: Normal rate, regular rhythm and intact distal pulses.  Exam reveals no gallop and no friction rub.   No murmur heard. Respiratory: Effort normal. No respiratory distress. He has no wheezes. He has rales.  GI: Soft. Bowel sounds are normal. He exhibits no distension. There is no tenderness.  Musculoskeletal: Normal range of motion. He exhibits edema.  No arthritis, no gout  Lymphadenopathy:    He has no cervical adenopathy.  Neurological: He is alert and oriented to person, place, and time. No cranial nerve deficit.  No dysarthria, no aphasia  Skin: Skin is warm and dry. No rash noted. No erythema.  Psychiatric: He has a normal mood and affect. His behavior is normal. Judgment and thought content normal.  LABORATORY PANEL:   CBC  Recent Labs Lab 10/18/16 1909  WBC 7.6  HGB 13.4  HCT 38.3*  PLT 196   ------------------------------------------------------------------------------------------------------------------  Chemistries   Recent Labs Lab 10/18/16 1909  NA 138  K 3.9  CL 104  CO2 26  GLUCOSE 163*  BUN 23*  CREATININE 1.50*  CALCIUM 9.4  AST 25  ALT 27  ALKPHOS 58  BILITOT 1.0   ------------------------------------------------------------------------------------------------------------------  Cardiac Enzymes  Recent Labs Lab 10/18/16 1909  TROPONINI <0.03   ------------------------------------------------------------------------------------------------------------------  RADIOLOGY:  Dg Chest 2 View  Result Date: 10/18/2016 CLINICAL DATA:  Bilateral foot swelling with shortness of breath EXAM: CHEST  2 VIEW COMPARISON:  08/09/2005 FINDINGS: There are tiny bilateral pleural effusion. No focal consolidation. The heart is slightly enlarged and there is mild central congestion. No pneumothorax. Mild wedging at the thoracolumbar junction.  IMPRESSION: Mild cardiomegaly with central congestion and tiny bilateral effusions. Electronically Signed   By: Donavan Foil M.D.   On: 10/18/2016 19:40   US Renal  Result Date: 10/17/2016 CLINICAL DATA:  Chronic renal disease. EXAM: RENAL / URINARY TRACT ULTRASOUND COMPLETE COMPARISON:  No prior. FINDINGS: Right Kidney: Length: 11.0 cm. Borderline increase echogenicity . No mass or hydronephrosis visualized. Miniscule amount of perirenal fluid noted adjacent to the upper pole. This is nonspecific. Left Kidney: Length: 10.2 cm. Borderline increased echogenicity. No mass or hydronephrosis visualized. Bladder: Appears normal for degree of bladder distention. IMPRESSION: 1. Borderline increased echogenicity both kidneys consistent with the patient's history chronic renal disease. 2. Miniscule amount of perirenal fluid noted along the right upper renal pole. This is nonspecific. No acute focal renal abnormality identified. No hydronephrosis. Bladder is nondistended. Electronically Signed   By: Marcello Moores  Register   On: 10/17/2016 17:02    EKG:   Orders placed or performed during the hospital encounter of 10/18/16  . ED EKG  . ED EKG    IMPRESSION AND PLAN:  Principal Problem:   Acute CHF (congestive heart failure) (HCC) - IV Lasix given in the ED, we will order another dose later on tonight. We will trend his troponins tonight, though he does not have presenting symptoms of angina. We'll get an echocardiogram and a cardiology consult morning. Active Problems:   AA (alcohol abuse) - CIWA protocol   DM type 2 with diabetic peripheral neuropathy (HCC) - sliding scale insulin with corresponding glucose checks, home dose gabapentin   Resistant hypertension - continue home meds   Chronic kidney disease (CKD), stage III (moderate) - at baseline, avoid nephrotoxins and monitor   Hyperlipidemia - home dose statin  All the records are reviewed and case discussed with ED provider. Management plans discussed  with the patient and/or family.  DVT PROPHYLAXIS: SubQ lovenox  GI PROPHYLAXIS: None  ADMISSION STATUS: Inpatient  CODE STATUS: Full Code Status History    This patient does not have a recorded code status. Please follow your organizational policy for patients in this situation.      TOTAL TIME TAKING CARE OF THIS PATIENT: 45 minutes.    Meigan Pates [place] 10/18/2016, 9:03 PM  [facility]  Office  [phone]  CC: Primary care physician; [name], DO

## 2016-10-18 NOTE — ED Triage Notes (Signed)
Pt states weeks of bilateral foot swelling with accompanied shob. Pt appears in no acute distress, resps unlabored. Pt states shob is worse with exertion and lying down.

## 2016-10-19 ENCOUNTER — Encounter: Payer: Self-pay | Admitting: *Deleted

## 2016-10-19 ENCOUNTER — Inpatient Hospital Stay
Admit: 2016-10-19 | Discharge: 2016-10-19 | Disposition: A | Discharge status: Home / Self Care | Payer: Medicaid Other | Attending: Internal Medicine | Admitting: Internal Medicine

## 2016-10-19 DIAGNOSIS — N183 Chronic kidney disease, stage 3 unspecified: Secondary | ICD-10-CM

## 2016-10-19 DIAGNOSIS — I10 Essential (primary) hypertension: Secondary | ICD-10-CM

## 2016-10-19 DIAGNOSIS — R748 Abnormal levels of other serum enzymes: Secondary | ICD-10-CM

## 2016-10-19 DIAGNOSIS — M19072 Primary osteoarthritis, left ankle and foot: Secondary | ICD-10-CM

## 2016-10-19 DIAGNOSIS — F1010 Alcohol abuse, uncomplicated: Secondary | ICD-10-CM

## 2016-10-19 DIAGNOSIS — I509 Heart failure, unspecified: Secondary | ICD-10-CM

## 2016-10-19 DIAGNOSIS — E1142 Type 2 diabetes mellitus with diabetic polyneuropathy: Secondary | ICD-10-CM

## 2016-10-19 DIAGNOSIS — E876 Hypokalemia: Secondary | ICD-10-CM

## 2016-10-19 DIAGNOSIS — R7989 Other specified abnormal findings of blood chemistry: Secondary | ICD-10-CM

## 2016-10-19 DIAGNOSIS — E785 Hyperlipidemia, unspecified: Secondary | ICD-10-CM

## 2016-10-19 DIAGNOSIS — R778 Other specified abnormalities of plasma proteins: Secondary | ICD-10-CM

## 2016-10-19 DIAGNOSIS — M7030 Other bursitis of elbow, unspecified elbow: Secondary | ICD-10-CM

## 2016-10-19 DIAGNOSIS — I5041 Acute combined systolic (congestive) and diastolic (congestive) heart failure: Secondary | ICD-10-CM

## 2016-10-19 LAB — CBC
HCT: 37.6 % — ABNORMAL LOW (ref 40.0–52.0)
Hemoglobin: 13.3 g/dL (ref 13.0–18.0)
MCH: 34.3 pg — AB (ref 26.0–34.0)
MCHC: 35.3 g/dL (ref 32.0–36.0)
MCV: 97 fL (ref 80.0–100.0)
PLATELETS: 188 10*3/uL (ref 150–440)
RBC: 3.88 MIL/uL — AB (ref 4.40–5.90)
RDW: 13.1 % (ref 11.5–14.5)
WBC: 7.1 10*3/uL (ref 3.8–10.6)

## 2016-10-19 LAB — GLUCOSE, CAPILLARY
GLUCOSE-CAPILLARY: 144 mg/dL — AB (ref 65–99)
Glucose-Capillary: 169 mg/dL — ABNORMAL HIGH (ref 65–99)
Glucose-Capillary: 157 mg/dL — ABNORMAL HIGH (ref 65–99)
Glucose-Capillary: 167 mg/dL — ABNORMAL HIGH (ref 65–99)

## 2016-10-19 LAB — TROPONIN I
TROPONIN I: 0.03 ng/mL — AB (ref <0.03)
Troponin I: <0.03 ng/mL (ref <0.03)

## 2016-10-19 LAB — MAGNESIUM: MAGNESIUM: 1.5 mg/dL — AB (ref 1.7–2.4)

## 2016-10-19 LAB — BASIC METABOLIC PANEL
Anion gap: 7 (ref 5–15)
BUN: 22 mg/dL — AB (ref 6–20)
CALCIUM: 9.2 mg/dL (ref 8.9–10.3)
CO2: 31 mmol/L (ref 22–32)
CREATININE: 1.46 mg/dL — AB (ref 0.61–1.24)
Chloride: 102 mmol/L (ref 101–111)
GFR calc non Af Amer: 52 mL/min — ABNORMAL LOW (ref >60)
GFR, EST AFRICAN AMERICAN: 60 mL/min — AB (ref >60)
GLUCOSE: 127 mg/dL — AB (ref 65–99)
Potassium: 3.2 mmol/L — ABNORMAL LOW (ref 3.5–5.1)
Sodium: 140 mmol/L (ref 135–145)

## 2016-10-19 MED ORDER — ISOSORBIDE MONONITRATE ER 60 MG PO TB24
60.0000 mg | ORAL_TABLET | Freq: Every day | ORAL | Status: DC
  Administered 2016-10-19 – 2016-10-22 (×4): 60 mg via ORAL
  Filled 2016-10-19 (×4): qty 1

## 2016-10-19 MED ORDER — AMLODIPINE BESYLATE 10 MG PO TABS
10.0000 mg | ORAL_TABLET | Freq: Every day | ORAL | Status: DC
  Administered 2016-10-19 – 2016-10-22 (×4): 10 mg via ORAL
  Filled 2016-10-19 (×4): qty 1

## 2016-10-19 MED ORDER — DULOXETINE HCL 30 MG PO CPEP
60.0000 mg | ORAL_CAPSULE | Freq: Every day | ORAL | Status: DC
  Administered 2016-10-19 – 2016-10-22 (×4): 60 mg via ORAL
  Filled 2016-10-19 (×4): qty 2

## 2016-10-19 MED ORDER — PERFLUTREN LIPID MICROSPHERE
1.0000 mL | INTRAVENOUS | Status: AC | PRN
  Administered 2016-10-19: 5.5 mL via INTRAVENOUS
  Filled 2016-10-19: qty 10

## 2016-10-19 MED ORDER — MAGNESIUM SULFATE 2 GM/50ML IV SOLN
2.0000 g | Freq: Once | INTRAVENOUS | Status: AC
  Administered 2016-10-19: 2 g via INTRAVENOUS
  Filled 2016-10-19 (×2): qty 50

## 2016-10-19 MED ORDER — HYDRALAZINE HCL 25 MG PO TABS
25.0000 mg | ORAL_TABLET | Freq: Four times a day (QID) | ORAL | Status: DC
  Administered 2016-10-19: 25 mg via ORAL
  Filled 2016-10-19: qty 1

## 2016-10-19 MED ORDER — CARVEDILOL 12.5 MG PO TABS
12.5000 mg | ORAL_TABLET | Freq: Two times a day (BID) | ORAL | Status: DC
  Administered 2016-10-19: 12.5 mg via ORAL
  Filled 2016-10-19: qty 1

## 2016-10-19 MED ORDER — POTASSIUM CHLORIDE CRYS ER 20 MEQ PO TBCR
40.0000 meq | EXTENDED_RELEASE_TABLET | ORAL | Status: AC
  Administered 2016-10-19 (×2): 40 meq via ORAL
  Filled 2016-10-19 (×2): qty 2

## 2016-10-19 MED ORDER — GABAPENTIN 100 MG PO CAPS
200.0000 mg | ORAL_CAPSULE | Freq: Three times a day (TID) | ORAL | Status: DC
  Administered 2016-10-19 – 2016-10-22 (×10): 200 mg via ORAL
  Filled 2016-10-19 (×10): qty 2

## 2016-10-19 MED ORDER — ATORVASTATIN CALCIUM 20 MG PO TABS
40.0000 mg | ORAL_TABLET | Freq: Every day | ORAL | Status: DC
  Administered 2016-10-19 – 2016-10-20 (×2): 40 mg via ORAL
  Filled 2016-10-19 (×2): qty 2

## 2016-10-19 MED ORDER — FUROSEMIDE 10 MG/ML IJ SOLN: 40.0000 mg | Freq: Every day | INTRAMUSCULAR | Status: DC

## 2016-10-19 MED ORDER — HYDRALAZINE HCL 50 MG PO TABS
50.0000 mg | ORAL_TABLET | Freq: Three times a day (TID) | ORAL | Status: DC
Start: 2016-10-19 — End: 2016-10-22
  Administered 2016-10-19 – 2016-10-22 (×9): 50 mg via ORAL
  Filled 2016-10-19 (×10): qty 1

## 2016-10-19 MED ORDER — FUROSEMIDE 10 MG/ML IJ SOLN: 80.0000 mg | Freq: Every day | INTRAMUSCULAR | Status: DC

## 2016-10-19 MED ORDER — INSULIN GLARGINE 100 UNIT/ML ~~LOC~~ SOLN
30.0000 [IU] | Freq: Every day | SUBCUTANEOUS | Status: DC
  Administered 2016-10-19 – 2016-10-21 (×3): 30 [IU] via SUBCUTANEOUS
  Filled 2016-10-19 (×4): qty 0.3

## 2016-10-19 MED ORDER — FUROSEMIDE 10 MG/ML IJ SOLN
40.0000 mg | Freq: Two times a day (BID) | INTRAMUSCULAR | Status: DC
  Administered 2016-10-19: 40 mg via INTRAVENOUS
  Filled 2016-10-19: qty 4

## 2016-10-19 NOTE — Consult Note (Signed)
Cardiology Consultation Note  Patient ID: ARVIL UTZ, MRN: [id number], DOB/AGE: Mar 15, 1961 56 y.o. Admit date: 10/18/2016   Date of Consult: 10/19/2016 Primary Physician: [name], DO Primary Cardiologist: New to [facility] - consult by Harrington Challenger Requesting Physician: Dr. Tressia Miners, MD  Chief Complaint: SOB/LE swelling Reason for Consult: Same  HPI: SNYDER COLAVITO is a 56 y.o. male who is being seen today for the evaluation of SOB/LE swelling at the request of Dr. Tressia Miners. Patient has a h/o refractory hypertension, CKD stage III, diabetes mellitus type 2 for at least 7 years, complicated by diabetic neuropathy, and ongoing alcohol abuse who presented to [facility] on 4/6 with a 2 week history of increased shortness of breath and lower extremity swelling.  Prior to this admission patient did not have any previously known cardiac history and has never seen a cardiologist before. He has been seen by PCP for refractory hypertension with blood work showing an elevated angiotensin/renin ratio back in the fall of 2017. He was referred to nephrology at that time though for uncertain reasons patient failed to show for an initial appointment. Patient was most recently seen by nephrology on 10/17/2016 for evaluation of his refractory hypertension and CKD stage III.[name] At that time he underwent renal ultrasound that showed findings consistent with CKD as well as small amount of perirenal fluid noted along the right upper renal pole that was felt to be nonspecific. Patient reports complete compliance with his antihypertensive medication regimen including carvedilol 12.5 mg twice a day, hydralazine 25 mg 4 times a day, and amlodipine 10 mg daily. Patient was previously on lisinopril though apparently this was discontinued secondary to hyperkalemia. He was also previously on hydrochlorothiazide though this too was discontinued secondary to his renal disease. He is uncertain what his blood pressure runs at home as he  does not check it. He is also uncertain what his weight is at home as he does not weigh himself. He denies any tobacco abuse, though does report a history of alcohol abuse, drinking 6-7 twelve ounce beers nightly. He denies any illegal drug abuse.  Over the past 2 weeks the patient has noticed increased shortness of breath with exertion as well as at rest with associated lower extremity swelling to the bilateral knees. He denies any orthopnea, abdominal distention, or early satiety. He has had a mild nonproductive cough. Never with chest pains, palpitations, nausea, vomiting, dizziness, presyncope, or syncope. He does not watch fluid intake and consumes a diet high in salt. Because his shortness of breath and lower extremity swelling persisted he presented to [facility].  Upon the patient's arrival to [facility] they were found to have BP 184/90 that is improved to 157/89, HR 86 bpm, temp 98.55F, oxygen saturation 94% on room air, weight 211 pounds. EKG showed sinus rhythm, 86 bpm, left bundle branch block (known), CXR showed mild cardiomegaly with vascular congestion and tiny bilateral pleural effusions. Labs showed troponin less than 0.03 trending to 0.032, BNP 541, serum creatinine 1.50 trending to 1.46, potassium 3.9 trending to 3.2, white blood cell count 7.6, hemoglobin 13.4, platelet count 196. He was given IV Lasix 60 mg 1 in the ED followed by IV Lasix 40 mg every 12 hours upon admission with a urine output of 4.3 L for the admission and weight reduction of 10 pounds since admission. He reports his shortness of breath and lower extremity swelling are much improved. Home antihypertensive medications were started upon cardiology consultation. Never with chest pain. Echocardiogram is pending.  Past  Medical History:  Diagnosis Date  . Ankle pain   . CKD (chronic kidney disease), stage III   . Diabetes mellitus with complication ([place])   . Diabetic neuropathy ([place])   . Hyperlipidemia   . Hypertension   . Pain  in both feet       Most Recent Cardiac Studies: TTE pending   Surgical History:  Past Surgical History:  Procedure Laterality Date  . TIBIA FRACTURE SURGERY Right 2002     Home Meds: Prior to Admission medications   Medication Sig Start Date End Date Taking? Authorizing Provider  acetaminophen (TYLENOL 8 HOUR) 650 MG CR tablet Take 1 tablet (650 mg total) by mouth every 8 (eight) hours as needed for pain. 08/28/15  Yes Amy Overton Mam, NP  Alpha-Lipoic Acid 600 MG CAPS Take 1 capsule by mouth daily.   Yes Historical Provider, MD  amLODipine (NORVASC) 10 MG tablet Take 1 tablet (10 mg total) by mouth daily. 08/22/16  Yes Olin Hauser, DO  aspirin EC 81 MG tablet Take 81 mg by mouth. 09/22/07  Yes Historical Provider, MD  atorvastatin (LIPITOR) 20 MG tablet Take 1 tablet (20 mg total) by mouth daily. 01/22/16  Yes Amy Overton Mam, NP  carvedilol (COREG) 12.5 MG tablet Take 1 tablet (12.5 mg total) by mouth 2 (two) times daily with a meal. 01/22/16  Yes Amy Overton Mam, NP  gabapentin (NEURONTIN) 400 MG capsule Take 2-3 capsules (800-1,200 mg total) by mouth 3 (three) times daily. 08/22/16  Yes Olin Hauser, DO  hydrALAZINE (APRESOLINE) 25 MG tablet Take 1 tablet (25 mg total) by mouth 4 (four) times daily. 08/22/16  Yes Alexander J Karamalegos, DO  LANTUS SOLOSTAR 100 UNIT/ML Solostar Pen Inject 36 units under the skin bedtime daily. Increase by 2 units per week until fasting blood sugars are <130. Patient taking differently: Inject 40 Units into the skin at bedtime.  07/23/16  Yes Olin Hauser, DO  metFORMIN (GLUCOPHAGE) 1000 MG tablet Take 1 tablet (1,000 mg total) by mouth 2 (two) times daily with a meal. 01/22/16 01/21/17 Yes Amy Overton Mam, NP  Blood Pressure Monitoring (BLOOD PRESSURE CUFF) MISC 1 each by Does not apply route daily. 02/12/16   Amy Lauren Krebs, NP  capsaicin (ZOSTRIX) 0.025 % cream Apply topically 2 (two) times daily. Patient not taking:  Reported on 10/18/2016 11/17/15   Amy Overton Mam, NP  DULoxetine (CYMBALTA) 60 MG capsule Take 1 capsule (60 mg total) by mouth daily. Patient not taking: Reported on 10/18/2016 03/14/16   Amy Overton Mam, NP  Exenatide ER 2 MG PEN Inject 2 mg into the skin once a week. Patient not taking: Reported on 10/18/2016 08/22/16   Olin Hauser, DO  glucose blood test strip Use as instructed 09/12/16   Olin Hauser, DO  Insulin Pen Needle (PEN NEEDLES 31GX5/16") 31G X 8 MM MISC Check blood sugar up to 3 times daily as advised. 07/23/16   Olin Hauser, DO    Inpatient Medications:  . amLODipine  10 mg Oral Daily  . atorvastatin  40 mg Oral q1800  . carvedilol  12.5 mg Oral BID WC  . DULoxetine  60 mg Oral Daily  . enoxaparin (LOVENOX) injection  40 mg Subcutaneous Q24H  . folic acid  1 mg Oral Daily  . [START ON 10/20/2016] furosemide  40 mg Intravenous Daily  . gabapentin  200 mg Oral TID  . hydrALAZINE  25 mg Oral Q6H  . insulin aspart  0-5 Units Subcutaneous QHS  . insulin aspart  0-9 Units Subcutaneous TID WC  . insulin glargine  30 Units Subcutaneous QHS  . LORazepam  0-4 mg Oral Q6H   Followed by  . [START ON 10/20/2016] LORazepam  0-4 mg Oral Q12H  . mouth rinse  15 mL Mouth Rinse BID  . multivitamin with minerals  1 tablet Oral Daily  . potassium chloride  40 mEq Oral Q4H  . sodium chloride flush  3 mL Intravenous Q12H  . thiamine  100 mg Oral Daily   Or  . thiamine  100 mg Intravenous Daily     Allergies:  Allergies  Allergen Reactions  . Terbinafine And Related Rash    Social History   Social History  . Marital status: Married    Spouse name: N/A  . Number of children: N/A  . Years of education: N/A   Occupational History  . Not on file.   Social History Main Topics  . Smoking status: Passive Smoke Exposure - Never Smoker  . Smokeless tobacco: Never Used  . Alcohol use Yes  . Drug use: No  . Sexual activity: Not on file   Other Topics  Concern  . Not on file   Social History Narrative  . No narrative on file     Family History  Problem Relation Age of Onset  . Hypertension Mother   . Heart disease Maternal Grandfather      Review of Systems: Review of Systems  Constitutional: Positive for malaise/fatigue. Negative for chills, diaphoresis, fever and weight loss.  HENT: Negative for congestion.   Eyes: Negative for discharge and redness.  Respiratory: Positive for cough and shortness of breath. Negative for hemoptysis, sputum production and wheezing.   Cardiovascular: Positive for leg swelling. Negative for chest pain, palpitations, orthopnea, claudication and PND.  Gastrointestinal: Negative for abdominal pain, blood in stool, heartburn, melena, nausea and vomiting.  Genitourinary: Negative for hematuria.  Musculoskeletal: Negative for falls and myalgias.  Skin: Negative for rash.  Neurological: Positive for weakness. Negative for dizziness, tingling, tremors, sensory change, speech change, focal weakness and loss of consciousness.  Endo/Heme/Allergies: Does not bruise/bleed easily.  Psychiatric/Behavioral: Negative for substance abuse. The patient is not nervous/anxious.   All other systems reviewed and are negative.   Labs:  Recent Labs  10/18/16 1909 10/18/16 2231 10/19/16 0411  TROPONINI <0.03 0.03* 0.03*   Lab Results  Component Value Date   WBC 7.1 10/19/2016   HGB 13.3 10/19/2016   HCT 37.6 (L) 10/19/2016   MCV 97.0 10/19/2016   PLT 188 10/19/2016     Recent Labs Lab 10/18/16 1909 10/19/16 0411  NA 138 140  K 3.9 3.2*  CL 104 102  CO2 26 31  BUN 23* 22*  CREATININE 1.50* 1.46*  CALCIUM 9.4 9.2  PROT 7.5  --   BILITOT 1.0  --   ALKPHOS 58  --   ALT 27  --   AST 25  --   GLUCOSE 163* 127*   Lab Results  Component Value Date   CHOL 219 (H) 08/28/2015   HDL 52 08/28/2015   LDLCALC 107 (H) 08/28/2015   TRIG 302 (H) 08/28/2015   No results found for:  DDIMER  Radiology/Studies:  Dg Chest 2 View  Result Date: 10/18/2016 CLINICAL DATA:  Bilateral foot swelling with shortness of breath EXAM: CHEST  2 VIEW COMPARISON:  08/09/2005 FINDINGS: There are tiny bilateral pleural effusion. No focal consolidation. The heart is slightly enlarged and  there is mild central congestion. No pneumothorax. Mild wedging at the thoracolumbar junction. IMPRESSION: Mild cardiomegaly with central congestion and tiny bilateral effusions. Electronically Signed   By: Donavan Foil M.D.   On: 10/18/2016 19:40   US Renal  Result Date: 10/17/2016 CLINICAL DATA:  Chronic renal disease. EXAM: RENAL / URINARY TRACT ULTRASOUND COMPLETE COMPARISON:  No prior. FINDINGS: Right Kidney: Length: 11.0 cm. Borderline increase echogenicity . No mass or hydronephrosis visualized. Miniscule amount of perirenal fluid noted adjacent to the upper pole. This is nonspecific. Left Kidney: Length: 10.2 cm. Borderline increased echogenicity. No mass or hydronephrosis visualized. Bladder: Appears normal for degree of bladder distention. IMPRESSION: 1. Borderline increased echogenicity both kidneys consistent with the patient's history chronic renal disease. 2. Miniscule amount of perirenal fluid noted along the right upper renal pole. This is nonspecific. No acute focal renal abnormality identified. No hydronephrosis. Bladder is nondistended. Electronically Signed   By: Marcello Moores  Register   On: 10/17/2016 17:02    EKG: Interpreted by me showed: NSR, 86 bpm, LBBB (old, dating back to at least 2007) Telemetry: Interpreted by me showed: NSR, 80s bpm  Weights: Filed Weights   10/18/16 1902 10/19/16 0615  Weight: 211 lb (95.7 kg) 201 lb 11.2 oz (91.5 kg)     Physical Exam: Blood pressure (!) 157/89, pulse 77, temperature 98.5 F (36.9 C), temperature source Oral, resp. rate 19, height 5\' 9"  (1.753 m), weight 201 lb 11.2 oz (91.5 kg), SpO2 92 %. Body mass index is 29.79 kg/m. General: Well developed,  well nourished, in no acute distress.Odor of tobacco, patient denies using tobacco.  Head: Normocephalic, atraumatic, sclera non-icteric, no xanthomas, nares are without discharge.  Neck: Negative for carotid bruits. JVD elevated to the angle of the mandible. Lungs: Faint bibasilar crackles. Breathing is unlabored. Heart: RRR with S1 S2. No murmurs, rubs, or gallops appreciated. Abdomen: Soft, non-tender, non-distended with normoactive bowel sounds. No hepatomegaly. No rebound/guarding. No obvious abdominal masses. Msk:  Strength and tone appear normal for age. Extremities: No clubbing or cyanosis. 1+ bilateral ankle edema. Distal pedal pulses are 2+ and equal bilaterally. Neuro: Alert and oriented X 3. No facial asymmetry. No focal deficit. Moves all extremities spontaneously. Psych:  Responds to questions appropriately with a normal affect.    Assessment and Plan:  Principal Problem:   Acute diastolic CHF (congestive heart failure) (HCC) Active Problems:   AA (alcohol abuse)   Chronic kidney disease (CKD), stage III (moderate)   DM type 2 with diabetic peripheral neuropathy (HCC)   Resistant hypertension   Hyperlipidemia   Elevated troponin   Hypokalemia    1. CHF: -Acute systolic and diastolic CHF  Prelim echo  Difficult study  LVEF is md to severely deprssed with signif hypokinesis of the septal, inferior and anterior walls   Pt denies CP  Has long history of EtOH abuse  Also history of HTN   -Likely in the setting of the patient's refractory hypertension as well as long-standing alcohol abuse drinking 6-7 twelve ounce beers since age 41 -Has diuresed well since admission, weight apparently down 10 pounds though uncertain of the validity of his admission weight -Urine output of 4.3 L to date with improved renal function -Agree with IV Lasix  twice a day with KCl repletion, -CHF education, consider dietary consultation -Continue carvedilol  2. Elevated troponin: -Minimally  elevated at 0.032 -Likely supply demand ischemia in the setting of the patient's volume overload and accelerated hypertension -ASA -Carvedilol as above  -Check fasting  lipid panel for further risk stratification  3. Refractory hypertension: -BP improving with IV Lasix only -Start home medications and to evaluate for improvement in blood pressure --Monitor and titrate antihypertensive medication accordingly  4. CKD stage III: -Likely in the setting of refractory hypertension -Monitor with diuresis  5. Hypokalemia: -Replete to goal 4.0 -Check magnesium level and replete to goal 2.0 as indicated  6. Diabetes mellitus with diabetic neuropathy: -Per internal medicine -Hemoglobin A1c pending  7. Alcohol abuse: -CIWA protocol per internal medicine -Cessation is advised -Check magnesium level as above   Signed, Christell Faith, PA-C [place] Pager: [phone] 10/19/2016, 10:21 AM   Pt seen and examined  I agree with findings as noted by R Dunn above  Pt is a 73 yho with no known cardiac problems  Prsents with increased SOB and edema  Found to have HTN out of control and also to be volume overloaded  Started on IV lasix ON exam:  Pt still volume overloaded.  Lungs:  Rales at based  Cardiac exam:  RRR  No definite S3  No murmurs  Abd   Supple  Ext with 1+ edema  EKG with SR LBBB  I have reviewed echo  Difficult study  LVEF is depressed  Mod to severe with signif hypokinesis of inferior, septal, anterior walls.  Recomm: Continue diuresis with IV lasix  Strict I/O  Follow cr    Replete KCL Dietary to consult on low Na diet With echo showing LVEF depressed would recomm L heart cath to define anatomy  Described test to patient  Will review tomorrow with him  Tentative can plan for Monday    WIll continue to follow.  Dorris Carnes

## 2016-10-19 NOTE — Progress Notes (Signed)
Paged dr. Tressia Miners again via phone waiting for md to return page. Need to make aware magnesium is 1.5

## 2016-10-19 NOTE — Progress Notes (Signed)
*  PRELIMINARY RESULTS* Echocardiogram 2D Echocardiogram has been performed. Definity Contrast was requested & used on this study.  [name] Terrisha Lopata 10/19/2016, 8:36 AM

## 2016-10-19 NOTE — Progress Notes (Signed)
Text paged dr. Tressia Miners to make aware patient magnesium level resulted as 1.5

## 2016-10-19 NOTE — Progress Notes (Signed)
[place] [facility] [place] NAME: [name]    MR#:  [id number]  DATE OF BIRTH:  [birth year]  SUBJECTIVE:  CHIEF COMPLAINT:   Chief Complaint  Patient presents with  . Foot Swelling   -Feels much better this morning. Admitted with lower extremity edema and dyspnea. Not on home oxygen -Currently on 2 L  REVIEW OF SYSTEMS:  Review of Systems  Constitutional: Negative for chills, fever and malaise/fatigue.  HENT: Negative for congestion, ear discharge, hearing loss and nosebleeds.   Eyes: Negative for blurred vision and double vision.  Respiratory: Positive for shortness of breath. Negative for cough and wheezing.   Cardiovascular: Positive for leg swelling. Negative for chest pain and palpitations.  Gastrointestinal: Negative for abdominal pain, constipation, diarrhea, nausea and vomiting.  Genitourinary: Negative for dysuria.  Musculoskeletal: Negative for myalgias.  Neurological: Negative for dizziness, sensory change, speech change, focal weakness, seizures and headaches.  Psychiatric/Behavioral: Negative for depression.    DRUG ALLERGIES:   Allergies  Allergen Reactions  . Terbinafine And Related Rash    VITALS:  Blood pressure (!) 157/89, pulse 77, temperature 98.5 F (36.9 C), temperature source Oral, resp. rate 19, height 5\' 9"  (1.753 m), weight 91.5 kg (201 lb 11.2 oz), SpO2 92 %.  PHYSICAL EXAMINATION:  Physical Exam  GENERAL:  56 y.o.-year-old patient lying in the bed with no acute distress.  EYES: Pupils equal, round, reactive to light and accommodation. No scleral icterus. Extraocular muscles intact.  HEENT: Head atraumatic, normocephalic. Oropharynx and nasopharynx clear.  NECK:  Supple, no jugular venous distention. No thyroid enlargement, no tenderness.  LUNGS: Normal breath sounds bilaterally, no wheezing, rales,rhonchi or crepitation. No use of accessory muscles of respiration. Decreased bibasilar breath  sounds CARDIOVASCULAR: S1, S2 normal. No murmurs, rubs, or gallops.  ABDOMEN: Soft, nontender, nondistended. Bowel sounds present. No organomegaly or mass.  EXTREMITIES: No cyanosis, or clubbing. Trace pedal edema present NEUROLOGIC: Cranial nerves II through XII are intact. Muscle strength 5/5 in all extremities. Sensation intact. Gait not checked.  PSYCHIATRIC: The patient is alert and oriented x 3.  SKIN: No obvious rash, lesion, or ulcer.    LABORATORY PANEL:   CBC  Recent Labs Lab 10/19/16 0411  WBC 7.1  HGB 13.3  HCT 37.6*  PLT 188   ------------------------------------------------------------------------------------------------------------------  Chemistries   Recent Labs Lab 10/18/16 1909 10/19/16 0411  NA 138 140  K 3.9 3.2*  CL 104 102  CO2 26 31  GLUCOSE 163* 127*  BUN 23* 22*  CREATININE 1.50* 1.46*  CALCIUM 9.4 9.2  AST 25  --   ALT 27  --   ALKPHOS 58  --   BILITOT 1.0  --    ------------------------------------------------------------------------------------------------------------------  Cardiac Enzymes  Recent Labs Lab 10/19/16 0411  TROPONINI 0.03*   ------------------------------------------------------------------------------------------------------------------  RADIOLOGY:  Dg Chest 2 View  Result Date: 10/18/2016 CLINICAL DATA:  Bilateral foot swelling with shortness of breath EXAM: CHEST  2 VIEW COMPARISON:  08/09/2005 FINDINGS: There are tiny bilateral pleural effusion. No focal consolidation. The heart is slightly enlarged and there is mild central congestion. No pneumothorax. Mild wedging at the thoracolumbar junction. IMPRESSION: Mild cardiomegaly with central congestion and tiny bilateral effusions. Electronically Signed   By: Donavan Foil M.D.   On: 10/18/2016 19:40   US Renal  Result Date: 10/17/2016 CLINICAL DATA:  Chronic renal disease. EXAM: RENAL / URINARY TRACT ULTRASOUND COMPLETE COMPARISON:  No prior. FINDINGS: Right  Kidney: Length: 11.0 cm. Borderline  increase echogenicity . No mass or hydronephrosis visualized. Miniscule amount of perirenal fluid noted adjacent to the upper pole. This is nonspecific. Left Kidney: Length: 10.2 cm. Borderline increased echogenicity. No mass or hydronephrosis visualized. Bladder: Appears normal for degree of bladder distention. IMPRESSION: 1. Borderline increased echogenicity both kidneys consistent with the patient's history chronic renal disease. 2. Miniscule amount of perirenal fluid noted along the right upper renal pole. This is nonspecific. No acute focal renal abnormality identified. No hydronephrosis. Bladder is nondistended. Electronically Signed   By: Marcello Moores  Register   On: 10/17/2016 17:02    EKG:   Orders placed or performed during the hospital encounter of 10/18/16  . ED EKG  . ED EKG    ASSESSMENT AND PLAN:   56 year old male with past medical history significant for diabetes and hypertension presents to hospital secondary to worsening lower extremity edema and dyspnea.  #1 acute congestive heart failure-could be diastolic dysfunction. -Cardiology consulted. Echocardiogram pending. -Continue Lasix today. Improving symptoms. -Wean oxygen as tolerated.  #2 hypokalemia-secondary to Lasix. Being replaced  #3 hypertension- restarted Norvasc, Coreg and Toprol hydralazine.  #4 diabetes mellitus-A1c is pending. We started Lantus. Also on sliding scale insulin. - exenatide and metformin can be restarted at discharge.  #5 neuropathy-continue gabapentin  #6 DVT prophylaxis-  #7 alcohol abuse-stable. On withdrawal protocol     All the records are reviewed and case discussed with Care Management/Social Workerr. Management plans discussed with the patient, family and they are in agreement.  CODE STATUS: Full code  TOTAL TIME TAKING CARE OF THIS PATIENT: 36 minutes.   POSSIBLE D/C tomorrow, DEPENDING ON CLINICAL CONDITION.   Gladstone Lighter M.D on  10/19/2016 at 10:16 AM  Between 7am to 6pm - Pager - [phone]  After 6pm go to [email or web address] - Proofreader  [facility]  Office  [phone]  CC: Primary care physician; [name], DO

## 2016-10-20 DIAGNOSIS — I5023 Acute on chronic systolic (congestive) heart failure: Secondary | ICD-10-CM

## 2016-10-20 DIAGNOSIS — I5021 Acute systolic (congestive) heart failure: Secondary | ICD-10-CM

## 2016-10-20 LAB — BASIC METABOLIC PANEL
ANION GAP: 8 (ref 5–15)
BUN: 27 mg/dL — ABNORMAL HIGH (ref 6–20)
CHLORIDE: 99 mmol/L — AB (ref 101–111)
CO2: 30 mmol/L (ref 22–32)
Calcium: 9 mg/dL (ref 8.9–10.3)
Creatinine, Ser: 1.48 mg/dL — ABNORMAL HIGH (ref 0.61–1.24)
GFR calc non Af Amer: 51 mL/min — ABNORMAL LOW (ref >60)
GFR, EST AFRICAN AMERICAN: 59 mL/min — AB (ref >60)
Glucose, Bld: 132 mg/dL — ABNORMAL HIGH (ref 65–99)
Potassium: 3.7 mmol/L (ref 3.5–5.1)
SODIUM: 137 mmol/L (ref 135–145)

## 2016-10-20 LAB — LIPID PANEL
CHOL/HDL RATIO: 3.4 ratio
CHOLESTEROL: 139 mg/dL (ref 0–200)
HDL: 41 mg/dL (ref >40)
LDL Cholesterol: 64 mg/dL (ref 0–99)
TRIGLYCERIDES: 171 mg/dL — AB (ref <150)
VLDL: 34 mg/dL (ref 0–40)

## 2016-10-20 LAB — GLUCOSE, CAPILLARY
GLUCOSE-CAPILLARY: 144 mg/dL — AB (ref 65–99)
Glucose-Capillary: 129 mg/dL — ABNORMAL HIGH (ref 65–99)
Glucose-Capillary: 135 mg/dL — ABNORMAL HIGH (ref 65–99)
Glucose-Capillary: 137 mg/dL — ABNORMAL HIGH (ref 65–99)

## 2016-10-20 LAB — HEMOGLOBIN A1C
Hgb A1c MFr Bld: 6.8 % — ABNORMAL HIGH (ref 4.8–5.6)
Mean Plasma Glucose: 148 mg/dL

## 2016-10-20 LAB — ECHOCARDIOGRAM COMPLETE
Height: 69 in
WEIGHTICAEL: 3227.2 [oz_av]

## 2016-10-20 MED ORDER — POTASSIUM CHLORIDE CRYS ER 20 MEQ PO TBCR
20.0000 meq | EXTENDED_RELEASE_TABLET | Freq: Every day | ORAL | Status: DC
  Administered 2016-10-20: 20 meq via ORAL
  Filled 2016-10-20: qty 1

## 2016-10-20 MED ORDER — SODIUM CHLORIDE 0.9 % IV SOLN
INTRAVENOUS | Status: DC
  Administered 2016-10-20 – 2016-10-21 (×2): via INTRAVENOUS

## 2016-10-20 MED ORDER — ASPIRIN 81 MG PO CHEW
81.0000 mg | CHEWABLE_TABLET | Freq: Every day | ORAL | Status: DC
  Administered 2016-10-20 – 2016-10-22 (×3): 81 mg via ORAL
  Filled 2016-10-20 (×2): qty 1

## 2016-10-20 MED ORDER — CARVEDILOL 25 MG PO TABS
25.0000 mg | ORAL_TABLET | Freq: Two times a day (BID) | ORAL | Status: DC
  Administered 2016-10-20 – 2016-10-22 (×4): 25 mg via ORAL
  Filled 2016-10-20 (×5): qty 1

## 2016-10-20 MED ORDER — CARVEDILOL 12.5 MG PO TABS
12.5000 mg | ORAL_TABLET | Freq: Once | ORAL | Status: AC
  Administered 2016-10-20: 12.5 mg via ORAL

## 2016-10-20 NOTE — Progress Notes (Signed)
[facility] [place] NAME: [name]    MR#:  [id number]  DATE OF BIRTH:  04-15-1961  SUBJECTIVE:  CHIEF COMPLAINT:   Chief Complaint  Patient presents with  . Foot Swelling   - off oxygen, breathing better. Leg edema improved - ECHO with diffuse hypokinesis - cardiac cath planned for tomorrow  REVIEW OF SYSTEMS:  Review of Systems  Constitutional: Negative for chills, fever and malaise/fatigue.  HENT: Negative for congestion, ear discharge, hearing loss and nosebleeds.   Eyes: Negative for blurred vision and double vision.  Respiratory: Positive for shortness of breath. Negative for cough and wheezing.   Cardiovascular: Positive for leg swelling. Negative for chest pain and palpitations.  Gastrointestinal: Negative for abdominal pain, constipation, diarrhea, nausea and vomiting.  Genitourinary: Negative for dysuria.  Musculoskeletal: Negative for myalgias.  Neurological: Negative for dizziness, sensory change, speech change, focal weakness, seizures and headaches.  Psychiatric/Behavioral: Negative for depression.    DRUG ALLERGIES:   Allergies  Allergen Reactions  . Terbinafine And Related Rash    VITALS:  Blood pressure (!) 155/83, pulse 75, temperature 97.6 F (36.4 C), temperature source Oral, resp. rate 16, height 5\' 9"  (1.753 m), weight 91 kg (200 lb 9.6 oz), SpO2 96 %.  PHYSICAL EXAMINATION:  Physical Exam  GENERAL:  56 y.o.-year-old patient lying in the bed with no acute distress.  EYES: Pupils equal, round, reactive to light and accommodation. No scleral icterus. Extraocular muscles intact.  HEENT: Head atraumatic, normocephalic. Oropharynx and nasopharynx clear.  NECK:  Supple, no jugular venous distention. No thyroid enlargement, no tenderness.  LUNGS: Normal breath sounds bilaterally, no wheezing, rales,rhonchi or crepitation. No use of accessory muscles of respiration. Fine bibasilar  crackles CARDIOVASCULAR: S1, S2 normal. No murmurs, rubs, or gallops.  ABDOMEN: Soft, nontender, nondistended. Bowel sounds present. No organomegaly or mass.  EXTREMITIES: No cyanosis, or clubbing. No pedal edema present NEUROLOGIC: Cranial nerves II through XII are intact. Muscle strength 5/5 in all extremities. Sensation intact. Gait not checked.  PSYCHIATRIC: The patient is alert and oriented x 3.  SKIN: No obvious rash, lesion, or ulcer.    LABORATORY PANEL:   CBC  Recent Labs Lab 10/19/16 0411  WBC 7.1  HGB 13.3  HCT 37.6*  PLT 188   ------------------------------------------------------------------------------------------------------------------  Chemistries   Recent Labs Lab 10/18/16 1909  10/19/16 1024 10/20/16 0615  NA 138  < >  --  137  K 3.9  < >  --  3.7  CL 104  < >  --  99*  CO2 26  < >  --  30  GLUCOSE 163*  < >  --  132*  BUN 23*  < >  --  27*  CREATININE 1.50*  < >  --  1.48*  CALCIUM 9.4  < >  --  9.0  MG  --   --  1.5*  --   AST 25  --   --   --   ALT 27  --   --   --   ALKPHOS 58  --   --   --   BILITOT 1.0  --   --   --   < > = values in this interval not displayed. ------------------------------------------------------------------------------------------------------------------  Cardiac Enzymes  Recent Labs Lab 10/19/16 1024  TROPONINI <0.03   ------------------------------------------------------------------------------------------------------------------  RADIOLOGY:  Dg Chest 2 View  Result Date: 10/18/2016 CLINICAL DATA:  Bilateral foot swelling with shortness of breath EXAM:  CHEST  2 VIEW COMPARISON:  08/09/2005 FINDINGS: There are tiny bilateral pleural effusion. No focal consolidation. The heart is slightly enlarged and there is mild central congestion. No pneumothorax. Mild wedging at the thoracolumbar junction. IMPRESSION: Mild cardiomegaly with central congestion and tiny bilateral effusions. Electronically Signed   By: Donavan Foil M.D.   On: 10/18/2016 19:40    EKG:   Orders placed or performed during the hospital encounter of 10/18/16  . ED EKG  . ED EKG  . EKG    ASSESSMENT AND PLAN:   56 year old male with past medical history significant for diabetes and hypertension presents to hospital secondary to worsening lower extremity edema and dyspnea.  #1 acute congestive heart failure- acute systolic and diastolic heart failure. - ECHO with diffuse hypokinesis, EF 25%- cardiac cath tomorrow -Appreciate Cardiology consult. -hold Lasix today for cath in AM with his renal disease. Improving symptoms. -off oxygen now.  #2 hypokalemia and hypomagnesemia-  replaced  #3 hypertension- on Norvasc, Coreg, hydralazine. And Imdur added  #4 diabetes mellitus-A1c is pending. restarted Lantus. Also on sliding scale insulin. - exenatide and metformin can be restarted at discharge.  #5 CKD stage 3- baseline cr at 1.4- 1.5, close to baseline, monitor after cath and also on lasix  #6 DVT prophylaxis- on lovenox  #7 alcohol abuse-stable. On withdrawal protocol     All the records are reviewed and case discussed with Care Management/Social Workerr. Management plans discussed with the patient, family and they are in agreement.  CODE STATUS: Full code  TOTAL TIME TAKING CARE OF THIS PATIENT: 36 minutes.   POSSIBLE D/C tomorrow, DEPENDING ON CLINICAL CONDITION.   Gladstone Lighter M.D on 10/20/2016 at 10:11 AM  Between 7am to 6pm - Pager - [phone]  After 6pm go to [email or web address] - Proofreader  [facility]  Office  [phone]  CC: Primary care physician; [name], DO

## 2016-10-20 NOTE — Progress Notes (Signed)
Patient Name: [name] Date of Encounter: 10/20/2016  Primary Cardiologist: New to [facility] - consult by [facility] Problem List     Principal Problem:   Acute systolic CHF (congestive heart failure) ([place]) Active Problems:   AA (alcohol abuse)   Chronic kidney disease (CKD), stage III (moderate)   DM type 2 with diabetic peripheral neuropathy (HCC)   Resistant hypertension   Hyperlipidemia   Elevated troponin   Hypokalemia     Subjective   No chest pain. SOB improving. LE swelling improved. Echo from 4/7 showed newly diagnosed cardiomyopathy with EF 20-25%, diffuse HK, LV mildly dilated, mild MR, LA mildly dilated (poor sound wave transmission, Definity administered). UOP 1.9 L for the past 24 hours with a net -5 L for the admission. Weight down one pound from 201 to 200 pounds. Renal function stable, though remains elevated at 1.48 (baseline appears to be approximately 1.1-1.2). Potassium improving. Magnesium low at 1.5 s/p repletion.   Inpatient Medications    Scheduled Meds: . amLODipine  10 mg Oral Daily  . atorvastatin  40 mg Oral q1800  . carvedilol  12.5 mg Oral BID WC  . DULoxetine  60 mg Oral Daily  . enoxaparin (LOVENOX) injection  40 mg Subcutaneous Q24H  . folic acid  1 mg Oral Daily  . gabapentin  200 mg Oral TID  . hydrALAZINE  50 mg Oral Q8H  . insulin aspart  0-5 Units Subcutaneous QHS  . insulin aspart  0-9 Units Subcutaneous TID WC  . insulin glargine  30 Units Subcutaneous QHS  . isosorbide mononitrate  60 mg Oral Daily  . LORazepam  0-4 mg Oral Q6H   Followed by  . LORazepam  0-4 mg Oral Q12H  . mouth rinse  15 mL Mouth Rinse BID  . multivitamin with minerals  1 tablet Oral Daily  . potassium chloride  20 mEq Oral Daily  . sodium chloride flush  3 mL Intravenous Q12H  . thiamine  100 mg Oral Daily   Or  . thiamine  100 mg Intravenous Daily   Continuous Infusions:  PRN Meds: acetaminophen **OR** acetaminophen, LORazepam **OR**  LORazepam, ondansetron **OR** ondansetron (ZOFRAN) IV   Vital Signs    Vitals:   10/19/16 0944 10/19/16 1308 10/19/16 2018 10/20/16 0358  BP:  (!) 177/83 (!) 150/87 (!) 147/86  Pulse:  84 81 75  Resp:  17 15 16   Temp:  98.3 F (36.8 C) 98 F (36.7 C) 97.6 F (36.4 C)  TempSrc:  Oral Oral Oral  SpO2: 92% 92% 93% 96%  Weight:    200 lb 9.6 oz (91 kg)  Height:        Intake/Output Summary (Last 24 hours) at 10/20/16 0759 Last data filed at 10/19/16 1846  Gross per 24 hour  Intake              530 ml  Output             1525 ml  Net             -995 ml   Filed Weights   10/18/16 1902 10/19/16 0615 10/20/16 0358  Weight: 211 lb (95.7 kg) 201 lb 11.2 oz (91.5 kg) 200 lb 9.6 oz (91 kg)    Physical Exam    GEN: Well nourished, well developed, in no acute distress.  HEENT: Grossly normal.  Neck: Supple, JVD elevated to just below the angle of the mandible, no carotid bruits, or  masses. Cardiac: RRR, no murmurs, rubs, or gallops. No clubbing, cyanosis, edema.  Radials/DP/PT 2+ and equal bilaterally.  Respiratory:  Bibasilar crackles. GI: Soft, nontender, nondistended, BS + x 4. MS: no deformity or atrophy. Skin: warm and dry, no rash. Neuro:  Strength and sensation are intact. Psych: AAOx3.  Normal affect.  Labs    CBC  Recent Labs  10/18/16 1909 10/19/16 0411  WBC 7.6 7.1  NEUTROABS 4.7  --   HGB 13.4 13.3  HCT 38.3* 37.6*  MCV 97.0 97.0  PLT 196 201   Basic Metabolic Panel  Recent Labs  10/19/16 0411 10/19/16 1024 10/20/16 0615  NA 140  --  137  K 3.2*  --  3.7  CL 102  --  99*  CO2 31  --  30  GLUCOSE 127*  --  132*  BUN 22*  --  27*  CREATININE 1.46*  --  1.48*  CALCIUM 9.2  --  9.0  MG  --  1.5*  --    Liver Function Tests  Recent Labs  10/18/16 1909  AST 25  ALT 27  ALKPHOS 58  BILITOT 1.0  PROT 7.5  ALBUMIN 3.8   No results for input(s): LIPASE, AMYLASE in the last 72 hours. Cardiac Enzymes  Recent Labs  10/18/16 2231  10/19/16 0411 10/19/16 1024  TROPONINI 0.03* 0.03* <0.03   BNP Invalid input(s): POCBNP D-Dimer No results for input(s): DDIMER in the last 72 hours. Hemoglobin A1C No results for input(s): HGBA1C in the last 72 hours. Fasting Lipid Panel No results for input(s): CHOL, HDL, LDLCALC, TRIG, CHOLHDL, LDLDIRECT in the last 72 hours. Thyroid Function Tests No results for input(s): TSH, T4TOTAL, T3FREE, THYROIDAB in the last 72 hours.  Invalid input(s): FREET3  Telemetry    NSR, 80s bpm - Personally Reviewed  ECG    n/a - Personally Reviewed  Radiology    Dg Chest 2 View  Result Date: 10/18/2016 CLINICAL DATA:  Bilateral foot swelling with shortness of breath EXAM: CHEST  2 VIEW COMPARISON:  08/09/2005 FINDINGS: There are tiny bilateral pleural effusion. No focal consolidation. The heart is slightly enlarged and there is mild central congestion. No pneumothorax. Mild wedging at the thoracolumbar junction. IMPRESSION: Mild cardiomegaly with central congestion and tiny bilateral effusions. Electronically Signed   By: Donavan Foil M.D.   On: 10/18/2016 19:40    Cardiac Studies   TTE 10/19/2016: Study Conclusions  - Procedure narrative: Transthoracic echocardiography. Image   quality was poor. The study was technically difficult, as a   result of poor acoustic windows and poor sound wave transmission.   Intravenous contrast (Definity) was administered. - Left ventricle: The cavity size was mildly dilated. Systolic   function was severely reduced. The estimated ejection fraction   was in the range of 25% to 30%. Diffuse hypokinesis. - Mitral valve: There was mild regurgitation. - Left atrium: The atrium was mildly dilated.  Patient Profile     56 y.o. male with history of refractory hypertension, CKD stage III, diabetes mellitus type 2 for at least 7 years, complicated by diabetic neuropathy, and ongoing alcohol abuse who presented to [facility] on 4/6 with a 2 week history of  increased shortness of breath and lower extremity swelling and was found to have acute systolic and diastolic CHF.   Assessment & Plan    1. Acute systolic and diastolic CHF: -Echo with EF 20-25% with diffuse HK, mildly dilated LA/LV -Troponin peak of 0.03 -No chest pain, SOB improved -  Diuresed well to date with a net -5 L -Will plan for [facility] on 4/9 with Dr. Fletcher Anon -Risks and benefits of cardiac catheterization have been discussed with the patient including risks of bleeding, bruising, infection, kidney damage, stroke, heart attack, and death. The patient understands these risks and is willing to proceed with the procedure. All questions have been answered and concerns listened to -Hold IV Lasix today in preparation of cath as above -Continue Coreg, ASA, Lipitor -Must evaluate for ischemia, though cannot rule out alcohol/HTN etiology   2. Elevated troponin: -Minimally elevated at 0.032 -LHC as above -ASA -Carvedilol as above  -Check fasting lipid panel for further risk stratification  3. Refractory hypertension: -BP remains elevated -Titrate Coreg to 25 mg bid -If needed, titrate hydralazine -Continue Norvasc 10 mg daily -Lisinopril has previously lead to hyperkalemia -HCTZ previously stopped 2/2 renal disease  4. CKD stage III: -Stable -Baseline SCr approximately 1.1-1.2 -Hold IV Lasix this AM -May require gentle IV hydration this evening -Limit contrast  5. Diabetes mellitus with diabetic neuropathy: -Per IM -A1c pending  6. Hypokalemia: -Improving -Replete to goal of 4.0  7. Hypomagnesemia: -Status post IV repletion  8. Alcohol abuse: -CIWA protocol  -Cessation advised   Signed, Christell Faith, PA-C [place] Pager: [phone] 10/20/2016, 7:59 AM   Pt seen and examined  I agree with findings as noted by R Dunn above Pt comfortable in bed  No CP ON exam:  Lungs are CTA  Cardiac RRR  No Murmurs  Abd benign.  Ext without edema Pt hs had signif diuresis  since admit  Echo with severe LV dysfunction  I have reviewed the images  I do think there are regional wall motion abnormalities (inferior, septal, anterior) sugg of CAD   Plan for cath tomorrow.  WIll hydrate this evening  Check labs in Am.  Dorris Carnes

## 2016-10-21 ENCOUNTER — Encounter: Payer: Self-pay | Admitting: Cardiovascular Disease

## 2016-10-21 ENCOUNTER — Encounter
Admission: EM | Disposition: A | Discharge status: Home / Self Care | Payer: Self-pay | Source: Home / Self Care | Attending: Internal Medicine

## 2016-10-21 DIAGNOSIS — I200 Unstable angina: Secondary | ICD-10-CM

## 2016-10-21 DIAGNOSIS — I25110 Atherosclerotic heart disease of native coronary artery with unstable angina pectoris: Secondary | ICD-10-CM

## 2016-10-21 HISTORY — PX: CORONARY STENT INTERVENTION: CATH118234

## 2016-10-21 HISTORY — PX: LEFT HEART CATH AND CORONARY ANGIOGRAPHY: CATH118249

## 2016-10-21 LAB — GLUCOSE, CAPILLARY
GLUCOSE-CAPILLARY: 115 mg/dL — AB (ref 65–99)
Glucose-Capillary: 98 mg/dL (ref 65–99)
Glucose-Capillary: 85 mg/dL (ref 65–99)

## 2016-10-21 LAB — BASIC METABOLIC PANEL
Anion gap: 7 (ref 5–15)
BUN: 29 mg/dL — ABNORMAL HIGH (ref 6–20)
CHLORIDE: 102 mmol/L (ref 101–111)
CO2: 26 mmol/L (ref 22–32)
CREATININE: 1.49 mg/dL — AB (ref 0.61–1.24)
Calcium: 9.3 mg/dL (ref 8.9–10.3)
GFR, EST AFRICAN AMERICAN: 58 mL/min — AB (ref >60)
GFR, EST NON AFRICAN AMERICAN: 50 mL/min — AB (ref >60)
Glucose, Bld: 110 mg/dL — ABNORMAL HIGH (ref 65–99)
Potassium: 3.7 mmol/L (ref 3.5–5.1)
SODIUM: 135 mmol/L (ref 135–145)

## 2016-10-21 LAB — POCT ACTIVATED CLOTTING TIME: ACTIVATED CLOTTING TIME: 235 s

## 2016-10-21 SURGERY — LEFT HEART CATH AND CORONARY ANGIOGRAPHY
Anesthesia: Moderate Sedation

## 2016-10-21 MED ORDER — VERAPAMIL HCL 2.5 MG/ML IV SOLN
INTRAVENOUS | Status: DC | PRN
  Administered 2016-10-21: 2.5 mg via INTRA_ARTERIAL

## 2016-10-21 MED ORDER — ASPIRIN 81 MG PO CHEW
CHEWABLE_TABLET | ORAL | Status: AC
  Filled 2016-10-21: qty 4

## 2016-10-21 MED ORDER — VERAPAMIL HCL 2.5 MG/ML IV SOLN
INTRAVENOUS | Status: AC
  Filled 2016-10-21: qty 2

## 2016-10-21 MED ORDER — MIDAZOLAM HCL 2 MG/2ML IJ SOLN
INTRAMUSCULAR | Status: DC | PRN
  Administered 2016-10-21: 1 mg via INTRAVENOUS

## 2016-10-21 MED ORDER — LIDOCAINE HCL (PF) 1 % IJ SOLN
INTRAMUSCULAR | Status: AC
  Filled 2016-10-21: qty 30

## 2016-10-21 MED ORDER — FENTANYL CITRATE (PF) 100 MCG/2ML IJ SOLN
INTRAMUSCULAR | Status: AC
  Filled 2016-10-21: qty 2

## 2016-10-21 MED ORDER — CLOPIDOGREL BISULFATE 75 MG PO TABS
ORAL_TABLET | ORAL | Status: DC | PRN
  Administered 2016-10-21: 600 mg via ORAL

## 2016-10-21 MED ORDER — HEPARIN SODIUM (PORCINE) 1000 UNIT/ML IJ SOLN
INTRAMUSCULAR | Status: DC | PRN
  Administered 2016-10-21: 4000 [IU] via INTRAVENOUS
  Administered 2016-10-21: 4500 [IU] via INTRAVENOUS
  Administered 2016-10-21: 2000 [IU] via INTRAVENOUS

## 2016-10-21 MED ORDER — NITROGLYCERIN 1 MG/10 ML FOR IR/CATH LAB
INTRA_ARTERIAL | Status: DC | PRN
  Administered 2016-10-21: 200 ug via INTRACORONARY

## 2016-10-21 MED ORDER — ASPIRIN 81 MG PO CHEW
CHEWABLE_TABLET | ORAL | Status: DC | PRN
  Administered 2016-10-21: 324 mg via ORAL

## 2016-10-21 MED ORDER — NITROGLYCERIN 5 MG/ML IV SOLN
INTRAVENOUS | Status: AC
  Filled 2016-10-21: qty 10

## 2016-10-21 MED ORDER — LIDOCAINE HCL (PF) 1 % IJ SOLN
INTRAMUSCULAR | Status: DC | PRN
  Administered 2016-10-21: 2 mL

## 2016-10-21 MED ORDER — ASPIRIN 81 MG PO CHEW
81.0000 mg | CHEWABLE_TABLET | ORAL | Status: DC
  Filled 2016-10-21: qty 1

## 2016-10-21 MED ORDER — HEPARIN SODIUM (PORCINE) 1000 UNIT/ML IJ SOLN
INTRAMUSCULAR | Status: AC
  Filled 2016-10-21: qty 1

## 2016-10-21 MED ORDER — FUROSEMIDE 40 MG PO TABS
40.0000 mg | ORAL_TABLET | Freq: Every day | ORAL | Status: DC
  Administered 2016-10-22: 40 mg via ORAL
  Filled 2016-10-21: qty 1

## 2016-10-21 MED ORDER — IOPAMIDOL (ISOVUE-300) INJECTION 61%
INTRAVENOUS | Status: DC | PRN
  Administered 2016-10-21: 105 mL via INTRA_ARTERIAL

## 2016-10-21 MED ORDER — HEPARIN (PORCINE) IN NACL 2-0.9 UNIT/ML-% IJ SOLN
INTRAMUSCULAR | Status: AC
  Filled 2016-10-21: qty 1000

## 2016-10-21 MED ORDER — CLOPIDOGREL BISULFATE 75 MG PO TABS
ORAL_TABLET | ORAL | Status: AC
  Filled 2016-10-21: qty 8

## 2016-10-21 MED ORDER — MIDAZOLAM HCL 2 MG/2ML IJ SOLN
INTRAMUSCULAR | Status: AC
  Filled 2016-10-21: qty 2

## 2016-10-21 MED ORDER — FENTANYL CITRATE (PF) 100 MCG/2ML IJ SOLN
INTRAMUSCULAR | Status: DC | PRN
  Administered 2016-10-21: 25 ug via INTRAVENOUS

## 2016-10-21 SURGICAL SUPPLY — 14 items
BALLN TREK RX 2.75X12 (BALLOONS) ×2
BALLOON TREK RX 2.75X12 (BALLOONS) ×1 IMPLANT
CATH 5FR JR4 DIAGNOSTIC (CATHETERS) ×2 IMPLANT
CATH OPTITORQUE JACKY 4.0 5F (CATHETERS) ×2 IMPLANT
CATH VISTA GUIDE 6FR XBLAD3.5 (CATHETERS) ×2 IMPLANT
DEVICE INFLAT 30 PLUS (MISCELLANEOUS) ×2 IMPLANT
DEVICE RAD COMP TR BAND LRG (VASCULAR PRODUCTS) ×2 IMPLANT
GLIDESHEATH SLEND SS 6F .021 (SHEATH) ×2 IMPLANT
GUIDEWIRE .025 260CM (WIRE) ×2 IMPLANT
KIT MANI 3VAL PERCEP (MISCELLANEOUS) ×2 IMPLANT
PACK CARDIAC CATH (CUSTOM PROCEDURE TRAY) ×2 IMPLANT
STENT XIENCE ALPINE RX 3.5X15 (Permanent Stent) ×2 IMPLANT
WIRE ROSEN-J .035X260CM (WIRE) ×2 IMPLANT
WIRE RUNTHROUGH .014X180CM (WIRE) ×2 IMPLANT

## 2016-10-21 NOTE — H&P (View-Only) (Signed)
Patient Name: [name] Date of Encounter: 10/20/2016  Primary Cardiologist: New to [facility] - consult by [facility] Problem List     Principal Problem:   Acute systolic CHF (congestive heart failure) ([place]) Active Problems:   AA (alcohol abuse)   Chronic kidney disease (CKD), stage III (moderate)   DM type 2 with diabetic peripheral neuropathy (HCC)   Resistant hypertension   Hyperlipidemia   Elevated troponin   Hypokalemia     Subjective   No chest pain. SOB improving. LE swelling improved. Echo from 4/7 showed newly diagnosed cardiomyopathy with EF 20-25%, diffuse HK, LV mildly dilated, mild MR, LA mildly dilated (poor sound wave transmission, Definity administered). UOP 1.9 L for the past 24 hours with a net -5 L for the admission. Weight down one pound from 201 to 200 pounds. Renal function stable, though remains elevated at 1.48 (baseline appears to be approximately 1.1-1.2). Potassium improving. Magnesium low at 1.5 s/p repletion.   Inpatient Medications    Scheduled Meds: . amLODipine  10 mg Oral Daily  . atorvastatin  40 mg Oral q1800  . carvedilol  12.5 mg Oral BID WC  . DULoxetine  60 mg Oral Daily  . enoxaparin (LOVENOX) injection  40 mg Subcutaneous Q24H  . folic acid  1 mg Oral Daily  . gabapentin  200 mg Oral TID  . hydrALAZINE  50 mg Oral Q8H  . insulin aspart  0-5 Units Subcutaneous QHS  . insulin aspart  0-9 Units Subcutaneous TID WC  . insulin glargine  30 Units Subcutaneous QHS  . isosorbide mononitrate  60 mg Oral Daily  . LORazepam  0-4 mg Oral Q6H   Followed by  . LORazepam  0-4 mg Oral Q12H  . mouth rinse  15 mL Mouth Rinse BID  . multivitamin with minerals  1 tablet Oral Daily  . potassium chloride  20 mEq Oral Daily  . sodium chloride flush  3 mL Intravenous Q12H  . thiamine  100 mg Oral Daily   Or  . thiamine  100 mg Intravenous Daily   Continuous Infusions:  PRN Meds: acetaminophen **OR** acetaminophen, LORazepam **OR**  LORazepam, ondansetron **OR** ondansetron (ZOFRAN) IV   Vital Signs    Vitals:   10/19/16 0944 10/19/16 1308 10/19/16 2018 10/20/16 0358  BP:  (!) 177/83 (!) 150/87 (!) 147/86  Pulse:  84 81 75  Resp:  17 15 16   Temp:  98.3 F (36.8 C) 98 F (36.7 C) 97.6 F (36.4 C)  TempSrc:  Oral Oral Oral  SpO2: 92% 92% 93% 96%  Weight:    200 lb 9.6 oz (91 kg)  Height:        Intake/Output Summary (Last 24 hours) at 10/20/16 0759 Last data filed at 10/19/16 1846  Gross per 24 hour  Intake              530 ml  Output             1525 ml  Net             -995 ml   Filed Weights   10/18/16 1902 10/19/16 0615 10/20/16 0358  Weight: 211 lb (95.7 kg) 201 lb 11.2 oz (91.5 kg) 200 lb 9.6 oz (91 kg)    Physical Exam    GEN: Well nourished, well developed, in no acute distress.  HEENT: Grossly normal.  Neck: Supple, JVD elevated to just below the angle of the mandible, no carotid bruits, or  masses. Cardiac: RRR, no murmurs, rubs, or gallops. No clubbing, cyanosis, edema.  Radials/DP/PT 2+ and equal bilaterally.  Respiratory:  Bibasilar crackles. GI: Soft, nontender, nondistended, BS + x 4. MS: no deformity or atrophy. Skin: warm and dry, no rash. Neuro:  Strength and sensation are intact. Psych: AAOx3.  Normal affect.  Labs    CBC  Recent Labs  10/18/16 1909 10/19/16 0411  WBC 7.6 7.1  NEUTROABS 4.7  --   HGB 13.4 13.3  HCT 38.3* 37.6*  MCV 97.0 97.0  PLT 196 154   Basic Metabolic Panel  Recent Labs  10/19/16 0411 10/19/16 1024 10/20/16 0615  NA 140  --  137  K 3.2*  --  3.7  CL 102  --  99*  CO2 31  --  30  GLUCOSE 127*  --  132*  BUN 22*  --  27*  CREATININE 1.46*  --  1.48*  CALCIUM 9.2  --  9.0  MG  --  1.5*  --    Liver Function Tests  Recent Labs  10/18/16 1909  AST 25  ALT 27  ALKPHOS 58  BILITOT 1.0  PROT 7.5  ALBUMIN 3.8   No results for input(s): LIPASE, AMYLASE in the last 72 hours. Cardiac Enzymes  Recent Labs  10/18/16 2231  10/19/16 0411 10/19/16 1024  TROPONINI 0.03* 0.03* <0.03   BNP Invalid input(s): POCBNP D-Dimer No results for input(s): DDIMER in the last 72 hours. Hemoglobin A1C No results for input(s): HGBA1C in the last 72 hours. Fasting Lipid Panel No results for input(s): CHOL, HDL, LDLCALC, TRIG, CHOLHDL, LDLDIRECT in the last 72 hours. Thyroid Function Tests No results for input(s): TSH, T4TOTAL, T3FREE, THYROIDAB in the last 72 hours.  Invalid input(s): FREET3  Telemetry    NSR, 80s bpm - Personally Reviewed  ECG    n/a - Personally Reviewed  Radiology    Dg Chest 2 View  Result Date: 10/18/2016 CLINICAL DATA:  Bilateral foot swelling with shortness of breath EXAM: CHEST  2 VIEW COMPARISON:  08/09/2005 FINDINGS: There are tiny bilateral pleural effusion. No focal consolidation. The heart is slightly enlarged and there is mild central congestion. No pneumothorax. Mild wedging at the thoracolumbar junction. IMPRESSION: Mild cardiomegaly with central congestion and tiny bilateral effusions. Electronically Signed   By: Donavan Foil M.D.   On: 10/18/2016 19:40    Cardiac Studies   TTE 10/19/2016: Study Conclusions  - Procedure narrative: Transthoracic echocardiography. Image   quality was poor. The study was technically difficult, as a   result of poor acoustic windows and poor sound wave transmission.   Intravenous contrast (Definity) was administered. - Left ventricle: The cavity size was mildly dilated. Systolic   function was severely reduced. The estimated ejection fraction   was in the range of 25% to 30%. Diffuse hypokinesis. - Mitral valve: There was mild regurgitation. - Left atrium: The atrium was mildly dilated.  Patient Profile     56 y.o. male with history of refractory hypertension, CKD stage III, diabetes mellitus type 2 for at least 7 years, complicated by diabetic neuropathy, and ongoing alcohol abuse who presented to [facility] on 4/6 with a 2 week history of  increased shortness of breath and lower extremity swelling and was found to have acute systolic and diastolic CHF.   Assessment & Plan    1. Acute systolic and diastolic CHF: -Echo with EF 20-25% with diffuse HK, mildly dilated LA/LV -Troponin peak of 0.03 -No chest pain, SOB improved -  Diuresed well to date with a net -5 L -Will plan for [facility] on 4/9 with Dr. Fletcher Anon -Risks and benefits of cardiac catheterization have been discussed with the patient including risks of bleeding, bruising, infection, kidney damage, stroke, heart attack, and death. The patient understands these risks and is willing to proceed with the procedure. All questions have been answered and concerns listened to -Hold IV Lasix today in preparation of cath as above -Continue Coreg, ASA, Lipitor -Must evaluate for ischemia, though cannot rule out alcohol/HTN etiology   2. Elevated troponin: -Minimally elevated at 0.032 -LHC as above -ASA -Carvedilol as above  -Check fasting lipid panel for further risk stratification  3. Refractory hypertension: -BP remains elevated -Titrate Coreg to 25 mg bid -If needed, titrate hydralazine -Continue Norvasc 10 mg daily -Lisinopril has previously lead to hyperkalemia -HCTZ previously stopped 2/2 renal disease  4. CKD stage III: -Stable -Baseline SCr approximately 1.1-1.2 -Hold IV Lasix this AM -May require gentle IV hydration this evening -Limit contrast  5. Diabetes mellitus with diabetic neuropathy: -Per IM -A1c pending  6. Hypokalemia: -Improving -Replete to goal of 4.0  7. Hypomagnesemia: -Status post IV repletion  8. Alcohol abuse: -CIWA protocol  -Cessation advised   Signed, Christell Faith, PA-C [place] Pager: [phone] 10/20/2016, 7:59 AM   Pt seen and examined  I agree with findings as noted by R Dunn above Pt comfortable in bed  No CP ON exam:  Lungs are CTA  Cardiac RRR  No Murmurs  Abd benign.  Ext without edema Pt hs had signif diuresis  since admit  Echo with severe LV dysfunction  I have reviewed the images  I do think there are regional wall motion abnormalities (inferior, septal, anterior) sugg of CAD   Plan for cath tomorrow.  WIll hydrate this evening  Check labs in Am.  Dorris Carnes

## 2016-10-21 NOTE — Care Management (Signed)
Referral to heart failure clinic is declined multiple times by patient and his wife.  They state that they see their doctors like they are suppose to and do not feel they need to add another appointment.  Currently on room air.  Independent in all adls, denies issues accessing medical care, obtaining medications or with transportation.  Current with  PCP at [facility] Has access to scales and verbalize know to weigh daily.  has the Heart Failure Education booklet in the room.  Declines several time HF Clinic referral and any home health follow up.

## 2016-10-21 NOTE — Interval H&P Note (Signed)
History and Physical Interval Note:  10/21/2016 8:31 AM  [name]  has presented today for surgery, with the diagnosis of acute systolic chf  The various methods of treatment have been discussed with the patient and family. After consideration of risks, benefits and other options for treatment, the patient has consented to  Procedure(s): Left Heart Cath and Coronary Angiography (N/A) as a surgical intervention .  The patient's history has been reviewed, patient examined, no change in status, stable for surgery.  I have reviewed the patient's chart and labs.  Questions were answered to the patient's satisfaction.     [name]

## 2016-10-21 NOTE — Progress Notes (Addendum)
[facility] NAME: [name]    MR#:  [id number]  DATE OF BIRTH:  [birth year]  SUBJECTIVE:  CHIEF COMPLAINT:   Chief Complaint  Patient presents with  . Foot Swelling   - Seen in the cardiac catheterization lab, status post LAD stent placed in this morning. -Patient denies any complaints. Right radial artery with pressure device and no active bleeding.  REVIEW OF SYSTEMS:  Review of Systems  Constitutional: Negative for chills, fever and malaise/fatigue.  HENT: Negative for congestion, ear discharge, hearing loss and nosebleeds.   Eyes: Negative for blurred vision and double vision.  Respiratory: Negative for cough, shortness of breath and wheezing.   Cardiovascular: Negative for chest pain, palpitations and leg swelling.  Gastrointestinal: Negative for abdominal pain, constipation, diarrhea, nausea and vomiting.  Genitourinary: Negative for dysuria.  Musculoskeletal: Negative for myalgias.  Neurological: Negative for dizziness, sensory change, speech change, focal weakness, seizures and headaches.  Psychiatric/Behavioral: Negative for depression.    DRUG ALLERGIES:   Allergies  Allergen Reactions  . Terbinafine And Related Rash    VITALS:  Blood pressure (!) 143/88, pulse 81, temperature 97.8 F (36.6 C), resp. rate (!) 21, height 5\' 9"  (1.753 m), weight 92.1 kg (203 lb), SpO2 96 %.  PHYSICAL EXAMINATION:  Physical Exam  GENERAL:  56 y.o.-year-old patient lying in the bed with no acute distress.  EYES: Pupils equal, round, reactive to light and accommodation. No scleral icterus. Extraocular muscles intact.  HEENT: Head atraumatic, normocephalic. Oropharynx and nasopharynx clear.  NECK:  Supple, no jugular venous distention. No thyroid enlargement, no tenderness.  LUNGS: Normal breath sounds bilaterally, no wheezing, rales,rhonchi or crepitation. No use of accessory muscles of respiration. Fine bibasilar  crackles CARDIOVASCULAR: S1, S2 normal. No murmurs, rubs, or gallops.  ABDOMEN: Soft, nontender, nondistended. Bowel sounds present. No organomegaly or mass.  EXTREMITIES: No cyanosis, or clubbing. No pedal edema present NEUROLOGIC: Cranial nerves II through XII are intact. Muscle strength 5/5 in all extremities. Sensation intact. Gait not checked.  PSYCHIATRIC: The patient is alert and oriented x 3.  SKIN: No obvious rash, lesion, or ulcer.    LABORATORY PANEL:   CBC  Recent Labs Lab 10/19/16 0411  WBC 7.1  HGB 13.3  HCT 37.6*  PLT 188   ------------------------------------------------------------------------------------------------------------------  Chemistries   Recent Labs Lab 10/18/16 1909  10/19/16 1024  10/21/16 0550  NA 138  < >  --   < > 135  K 3.9  < >  --   < > 3.7  CL 104  < >  --   < > 102  CO2 26  < >  --   < > 26  GLUCOSE 163*  < >  --   < > 110*  BUN 23*  < >  --   < > 29*  CREATININE 1.50*  < >  --   < > 1.49*  CALCIUM 9.4  < >  --   < > 9.3  MG  --   --  1.5*  --   --   AST 25  --   --   --   --   ALT 27  --   --   --   --   ALKPHOS 58  --   --   --   --   BILITOT 1.0  --   --   --   --   < > = values in  this interval not displayed. ------------------------------------------------------------------------------------------------------------------  Cardiac Enzymes  Recent Labs Lab 10/19/16 1024  TROPONINI <0.03   ------------------------------------------------------------------------------------------------------------------  RADIOLOGY:  No results found.  EKG:   Orders placed or performed during the hospital encounter of 10/18/16  . ED EKG  . ED EKG  . EKG    ASSESSMENT AND PLAN:   56 year old male with past medical history significant for diabetes and hypertension presents to hospital secondary to worsening lower extremity edema and dyspnea.  #1 acute congestive heart failure- acute systolic and diastolic heart failure. - ECHO  with diffuse hypokinesis, EF 25%- cardiac cath today s/p LAD stent -Appreciate Cardiology consult. -restart Lasix  in AM. Improving symptoms. -off oxygen now.  #2 CAD-status post cardiac catheterization revealing 90% LAD stenosis. Appreciate cardiology consult. Drug-eluting stent placed in. -On aspirin, plavix, Coreg and statin  #3 hypertension- on Norvasc, Coreg, hydralazine. And Imdur added  #4 diabetes mellitus-A1c is 6.8. restarted Lantus. Also on sliding scale insulin. - exenatide and metformin can be restarted at discharge.  #5 CKD stage 3- baseline cr at 1.4- 1.5, close to baseline, monitor after cath and also on lasix  #6 DVT prophylaxis- on lovenox  #7 alcohol abuse-stable. On withdrawal protocol. No active withdrawals     All the records are reviewed and case discussed with Care Management/Social Workerr. Management plans discussed with the patient, family and they are in agreement.  CODE STATUS: Full code  TOTAL TIME TAKING CARE OF THIS PATIENT: 36 minutes.   POSSIBLE D/C tomorrow, DEPENDING ON CLINICAL CONDITION.   Gladstone Lighter M.D on 10/21/2016 at 12:18 PM  Between 7am to 6pm - Pager - [phone]  After 6pm go to [email or web address] - Proofreader  [facility]  Office  [phone]  CC: Primary care physician; [name], DO

## 2016-10-21 NOTE — Progress Notes (Addendum)
1140 back from cath. Lab. R radial artery t-band removed in Cath.lab. Denies pain.

## 2016-10-22 DIAGNOSIS — E785 Hyperlipidemia, unspecified: Secondary | ICD-10-CM

## 2016-10-22 DIAGNOSIS — I200 Unstable angina: Secondary | ICD-10-CM

## 2016-10-22 LAB — BASIC METABOLIC PANEL
ANION GAP: 8 (ref 5–15)
BUN: 29 mg/dL — AB (ref 6–20)
CHLORIDE: 103 mmol/L (ref 101–111)
CO2: 24 mmol/L (ref 22–32)
Calcium: 8.6 mg/dL — ABNORMAL LOW (ref 8.9–10.3)
Creatinine, Ser: 1.53 mg/dL — ABNORMAL HIGH (ref 0.61–1.24)
GFR calc Af Amer: 57 mL/min — ABNORMAL LOW (ref >60)
GFR calc non Af Amer: 49 mL/min — ABNORMAL LOW (ref >60)
Glucose, Bld: 89 mg/dL (ref 65–99)
POTASSIUM: 3.7 mmol/L (ref 3.5–5.1)
SODIUM: 135 mmol/L (ref 135–145)

## 2016-10-22 LAB — GLUCOSE, CAPILLARY
GLUCOSE-CAPILLARY: 90 mg/dL (ref 65–99)
Glucose-Capillary: 86 mg/dL (ref 65–99)

## 2016-10-22 MED ORDER — CLOPIDOGREL BISULFATE 75 MG PO TABS: 75.0000 mg | ORAL_TABLET | Freq: Every day | ORAL | 10 refills | Status: DC

## 2016-10-22 MED ORDER — GABAPENTIN 400 MG PO CAPS: 400.0000 mg | ORAL_CAPSULE | Freq: Three times a day (TID) | ORAL | 0 refills | Status: DC

## 2016-10-22 MED ORDER — FUROSEMIDE 40 MG PO TABS: 40.0000 mg | ORAL_TABLET | Freq: Every day | ORAL | 2 refills | Status: DC

## 2016-10-22 MED ORDER — ISOSORBIDE MONONITRATE ER 60 MG PO TB24: 60.0000 mg | ORAL_TABLET | Freq: Every day | ORAL | 2 refills | Status: DC

## 2016-10-22 MED ORDER — CARVEDILOL 25 MG PO TABS: 25.0000 mg | ORAL_TABLET | Freq: Two times a day (BID) | ORAL | 2 refills | Status: DC

## 2016-10-22 MED ORDER — CLOPIDOGREL BISULFATE 75 MG PO TABS
75.0000 mg | ORAL_TABLET | Freq: Every day | ORAL | Status: DC
Start: 2016-10-22 — End: 2016-10-22
  Administered 2016-10-22: 75 mg via ORAL
  Filled 2016-10-22: qty 1

## 2016-10-22 MED ORDER — HYDRALAZINE HCL 50 MG PO TABS: 50.0000 mg | ORAL_TABLET | Freq: Three times a day (TID) | ORAL | 2 refills | Status: DC

## 2016-10-22 MED ORDER — ATORVASTATIN CALCIUM 40 MG PO TABS: 40.0000 mg | ORAL_TABLET | Freq: Every day | ORAL | 2 refills | Status: DC

## 2016-10-22 MED ORDER — LISINOPRIL 5 MG PO TABS: 5.0000 mg | ORAL_TABLET | Freq: Every day | ORAL | Status: DC

## 2016-10-22 MED ORDER — LANTUS SOLOSTAR 100 UNIT/ML ~~LOC~~ SOPN: 40.0000 [IU] | PEN_INJECTOR | Freq: Every day | SUBCUTANEOUS | 0 refills | Status: DC

## 2016-10-22 NOTE — Progress Notes (Signed)
Progress Note  Patient Name: [name] Date of Encounter: 10/22/2016  Primary Cardiologist: Jerilynn Mages. Fletcher Anon, MD   Subjective   S/p PCI of LAD yesterday. No chest pain overnight.  Eager to go home.  Breathing/swelling much improved.  Inpatient Medications    Scheduled Meds: . amLODipine  10 mg Oral Daily  . aspirin  81 mg Oral Pre-Cath  . aspirin  81 mg Oral Daily  . atorvastatin  40 mg Oral q1800  . carvedilol  25 mg Oral BID WC  . clopidogrel  75 mg Oral Daily  . DULoxetine  60 mg Oral Daily  . enoxaparin (LOVENOX) injection  40 mg Subcutaneous Q24H  . folic acid  1 mg Oral Daily  . furosemide  40 mg Oral Daily  . gabapentin  200 mg Oral TID  . hydrALAZINE  50 mg Oral Q8H  . insulin aspart  0-5 Units Subcutaneous QHS  . insulin aspart  0-9 Units Subcutaneous TID WC  . insulin glargine  30 Units Subcutaneous QHS  . isosorbide mononitrate  60 mg Oral Daily  . lisinopril  5 mg Oral Daily  . LORazepam  0-4 mg Oral Q12H  . mouth rinse  15 mL Mouth Rinse BID  . multivitamin with minerals  1 tablet Oral Daily  . sodium chloride flush  3 mL Intravenous Q12H  . thiamine  100 mg Oral Daily   Or  . thiamine  100 mg Intravenous Daily   Continuous Infusions:  PRN Meds: acetaminophen **OR** acetaminophen, ondansetron **OR** ondansetron (ZOFRAN) IV   Vital Signs    Vitals:   10/21/16 1306 10/21/16 1409 10/21/16 2140 10/22/16 0414  BP: (!) 149/84 134/81 131/73 114/66  Pulse: 73 76 75 76  Resp:  20 18 18   Temp:  98.2 F (36.8 C) 98 F (36.7 C) 97.8 F (36.6 C)  TempSrc:  Oral Oral Oral  SpO2: 95% 98% 92% 94%  Weight:    199 lb 14.4 oz (90.7 kg)  Height:        Intake/Output Summary (Last 24 hours) at 10/22/16 0841 Last data filed at 10/22/16 0600  Gross per 24 hour  Intake              480 ml  Output             1500 ml  Net            -1020 ml   Filed Weights   10/20/16 0358 10/21/16 0539 10/22/16 0414  Weight: 200 lb 9.6 oz (91 kg) 203 lb (92.1 kg) 199 lb  14.4 oz (90.7 kg)    Physical Exam   GEN: Well nourished, well developed, in no acute distress.  HEENT: Grossly normal.  Neck: Supple, no JVD, carotid bruits, or masses. Cardiac: RRR, no murmurs, rubs, or gallops. No clubbing, cyanosis, edema.  Radials/DP/PT 2+ and equal bilaterally. R radial cath site w/o bleeding/bruit/hematoma. Respiratory:  Respirations regular and unlabored, few crackles left base, otw cta. GI: obese, soft, nontender, nondistended, BS + x 4. MS: no deformity or atrophy. Skin: warm and dry, no rash. Neuro:  Strength and sensation are intact. Psych: AAOx3.  Normal affect.  Labs    Chemistry Recent Labs Lab 10/18/16 1909  10/20/16 0615 10/21/16 0550 10/22/16 0615  NA 138  < > 137 135 135  K 3.9  < > 3.7 3.7 3.7  CL 104  < > 99* 102 103  CO2 26  < > 30 26 24   GLUCOSE  163*  < > 132* 110* 89  BUN 23*  < > 27* 29* 29*  CREATININE 1.50*  < > 1.48* 1.49* 1.53*  CALCIUM 9.4  < > 9.0 9.3 8.6*  PROT 7.5  --   --   --   --   ALBUMIN 3.8  --   --   --   --   AST 25  --   --   --   --   ALT 27  --   --   --   --   ALKPHOS 58  --   --   --   --   BILITOT 1.0  --   --   --   --   GFRNONAA 50*  < > 51* 50* 49*  GFRAA 58*  < > 59* 58* 57*  ANIONGAP 8  < > 8 7 8   < > = values in this interval not displayed.   Hematology  Recent Labs Lab 10/18/16 1909 10/19/16 0411  WBC 7.6 7.1  RBC 3.95* 3.88*  HGB 13.4 13.3  HCT 38.3* 37.6*  MCV 97.0 97.0  MCH 33.9 34.3*  MCHC 34.9 35.3  RDW 12.8 13.1  PLT 196 188    Cardiac Enzymes  Recent Labs Lab 10/18/16 1909 10/18/16 2231 10/19/16 0411 10/19/16 1024  TROPONINI <0.03 0.03* 0.03* <0.03     BNP  Recent Labs Lab 10/18/16 1909  BNP 541.0*    Radiology    No results found.  Telemetry    RSR - Personally Reviewed  Cardiac Studies   TTE 10/19/2016: Study Conclusions   - Procedure narrative: Transthoracic echocardiography. Image   quality was poor. The study was technically difficult, as a    result of poor acoustic windows and poor sound wave transmission.   Intravenous contrast (Definity) was administered. - Left ventricle: The cavity size was mildly dilated. Systolic   function was severely reduced. The estimated ejection fraction   was in the range of 25% to 30%. Diffuse hypokinesis. - Mitral valve: There was mild regurgitation.  Patient Profile     56 y.o. male with history of refractory hypertension, CKD stage III, diabetes mellitus type 2 for at least 7 years, complicated by diabetic neuropathy, and ongoing alcohol abuse who presented to [facility] on 4/6 with a 2 week history of increased shortness of breath and lower extremity swelling and was found to have acute systolic and diastolic CHF. Cath revealed severe mLAD dzs now s/p PCI/DES.  Assessment & Plan    1.  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  Found to have EF of 25-30% by echo.  Responded well to diuresis.  Net neg 1L overnight and 5.8L since admission.  Wt down 12 lbs total to 199 lbs this am.  Cath yesterday revealed severe mLAD dzs  PCI/DES.  LVEDP 15-7mmHg.  No c/p or dyspnea this am.  Euvolemic on exam.  Cont  blocker, hydral, nitrate, and current dose of PO lasix.  No acei/arb/arni/spiro in setting of prior hyperkalemia on lisinopril.  Will need office f/u within the next week w/ bmet @ that time. Global HK unlikely to be explained by LAD dzs w/ nl troponins.  ETOH likely playing a role in cardiomyopathy - cessation advised.  OK for d/c today from our standpoint.  2.  CAD:  s/p cath yesterday revealing severe mLAD dzs, now s/p DES.  No c/p overnight.  Cont asa,  blocker, statin,  plavix.  3.  Hypertensive Heart Dzs:  bp trended higher last night - lower this am. Cont  blocker, hydral/nitrate, amlodipine.  4.  CKD III:  Creat stable.  With contrast load, plan f/u bmet w/in next week as outpt.  5.  Hypokalemia:   Stable.  6.  DM II:  Per IM. A1c 6.8.  7.  ETOH abuse:  As above, likely contributing to cardiomyopathy.  Cessation advised.  Signed, Murray Hodgkins, NP  10/22/2016, 8:41 AM

## 2016-10-22 NOTE — Discharge Summary (Signed)
[facility] [place] NAME: [name]    MR#:  [id number]  DATE OF BIRTH:  06/16/1961  DATE OF ADMISSION:  10/18/2016   ADMITTING PHYSICIAN: Lance Coon, MD  DATE OF DISCHARGE:  10/22/2016  PRIMARY CARE PHYSICIAN: [name], DO   ADMISSION DIAGNOSIS:   Acute respiratory failure with hypoxia (HCC) [F75.10] Combined systolic and diastolic congestive heart failure, unspecified congestive heart failure chronicity ([facility]) [I50.40]  DISCHARGE DIAGNOSIS:   Principal Problem:   Acute systolic CHF (congestive heart failure) (HCC) Active Problems:   AA (alcohol abuse)   Chronic kidney disease (CKD), stage III (moderate)   DM type 2 with diabetic peripheral neuropathy (HCC)   Resistant hypertension   Hyperlipidemia   Elevated troponin   Hypokalemia   Unstable angina ([place])   SECONDARY DIAGNOSIS:   Past Medical History:  Diagnosis Date  . Ankle pain   . CKD (chronic kidney disease), stage III   . Diabetes mellitus with complication ([facility])   . Diabetic neuropathy ([place])   . Hyperlipidemia   . Hypertension   . Pain in both feet     HOSPITAL COURSE:   56 year old male with past medical history significant for diabetes and hypertension presents to hospital secondary to worsening lower extremity edema and dyspnea.  #1 acute congestive heart failure- acute systolic and diastolic heart failure. - ECHO with diffuse hypokinesis, EF 25%- cardiac cath with ischemic and dilated cardiomyopathy s/p LAD stent -Appreciate Cardiology consult. -Received IV Lasix in the hospital and is being discharged on oral Lasix. His pedal edema has improved and his breathing has significantly improved. He is off oxygen. - h/o ACEI/ARB causing hyperkalemia in past- so can be started as outpatient by cardiology  #2 CAD-status post cardiac catheterization revealing 90% LAD stenosis. Appreciate cardiology consult. Drug-eluting stent placed in. -On  aspirin, plavix, Coreg and statin -Cardiac rehabilitation after discharge.  #3 hypertension- on Norvasc, Coreg, hydralazine. And Imdur added  #4 diabetes mellitus-A1c is 6.8. restarted Lantus and metformin at discharge  #5 CKD stage 3- baseline cr at 1.4- 1.5, close to baseline, stable after cath and also on lasix  #6 alcohol abuse-stable. Counseled as partly cardiomyopathy also could be alcoholic as LAD stenosis will not explain diffuse hypokinesis   Ambulated well prior to discharge without any symptoms.   DISCHARGE CONDITIONS:   Stable  CONSULTS OBTAINED:    cardiology consultation by Dr. Fletcher Anon  DRUG ALLERGIES:   Allergies  Allergen Reactions  . Lisinopril     Hyperkalemia   . Terbinafine And Related Rash   DISCHARGE MEDICATIONS:   Allergies as of 10/22/2016      Reactions   Lisinopril    Hyperkalemia   Terbinafine And Related Rash      Medication List    STOP taking these medications   capsaicin 0.025 % cream Commonly known as:  ZOSTRIX   DULoxetine 60 MG capsule Commonly known as:  CYMBALTA   Exenatide ER 2 MG Pen     TAKE these medications   acetaminophen 650 MG CR tablet Commonly known as:  TYLENOL 8 HOUR Take 1 tablet (650 mg total) by mouth every 8 (eight) hours as needed for pain.   Alpha-Lipoic Acid 600 MG Caps Take 1 capsule by mouth daily.   amLODipine 10 MG tablet Commonly known as:  NORVASC Take 1 tablet (10 mg total) by mouth daily.   aspirin EC 81 MG tablet Take 81 mg by mouth.   atorvastatin 40  MG tablet Commonly known as:  LIPITOR Take 1 tablet (40 mg total) by mouth daily at 6 PM. What changed:  medication strength  how much to take  when to take this   Blood Pressure Cuff Misc 1 each by Does not apply route daily.   carvedilol 25 MG tablet Commonly known as:  COREG Take 1 tablet (25 mg total) by mouth 2 (two) times daily with a meal. What changed:  medication strength  how much to take   clopidogrel 75  MG tablet Commonly known as:  PLAVIX Take 1 tablet (75 mg total) by mouth daily. Start taking on:  10/23/2016   furosemide 40 MG tablet Commonly known as:  LASIX Take 1 tablet (40 mg total) by mouth daily. Start taking on:  10/23/2016   gabapentin 400 MG capsule Commonly known as:  NEURONTIN Take 1 capsule (400 mg total) by mouth 3 (three) times daily. What changed:  how much to take   glucose blood test strip Use as instructed   hydrALAZINE 50 MG tablet Commonly known as:  APRESOLINE Take 1 tablet (50 mg total) by mouth every 8 (eight) hours. What changed:  medication strength  how much to take  when to take this   isosorbide mononitrate 60 MG 24 hr tablet Commonly known as:  IMDUR Take 1 tablet (60 mg total) by mouth daily. Start taking on:  10/23/2016   LANTUS SOLOSTAR 100 UNIT/ML Solostar Pen Generic drug:  Insulin Glargine Inject 40 Units into the skin at bedtime.   metFORMIN 1000 MG tablet Commonly known as:  GLUCOPHAGE Take 1 tablet (1,000 mg total) by mouth 2 (two) times daily with a meal.   PEN NEEDLES 31GX5/16" 31G X 8 MM Misc Check blood sugar up to 3 times daily as advised.        DISCHARGE INSTRUCTIONS:   1. Cardiology follow up in 2 weeks 2. PCP follow-up in 1-2 weeks  DIET:   Cardiac diet  ACTIVITY:   Activity as tolerated  OXYGEN:   Home Oxygen: No.  Oxygen Delivery: room air  DISCHARGE LOCATION:   home   If you experience worsening of your admission symptoms, develop shortness of breath, life threatening emergency, suicidal or homicidal thoughts you must seek medical attention immediately by calling 911 or calling your MD immediately  if symptoms less severe.  You Must read complete instructions/literature along with all the possible adverse reactions/side effects for all the Medicines you take and that have been prescribed to you. Take any new Medicines after you have completely understood and accpet all the possible adverse  reactions/side effects.   Please note  You were cared for by a hospitalist during your hospital stay. If you have any questions about your discharge medications or the care you received while you were in the hospital after you are discharged, you can call the unit and asked to speak with the hospitalist on call if the hospitalist that took care of you is not available. Once you are discharged, your primary care physician will handle any further medical issues. Please note that NO REFILLS for any discharge medications will be authorized once you are discharged, as it is imperative that you return to your primary care physician (or establish a relationship with a primary care physician if you do not have one) for your aftercare needs so that they can reassess your need for medications and monitor your lab values.    On the day of Discharge:  VITAL SIGNS:  Blood pressure 128/69, pulse 73, temperature 98.3 F (36.8 C), temperature source Oral, resp. rate 18, height 5\' 9"  (1.753 m), weight 90.7 kg (199 lb 14.4 oz), SpO2 95 %.  PHYSICAL EXAMINATION:    GENERAL:  56 y.o.-year-old patient lying in the bed with no acute distress.  EYES: Pupils equal, round, reactive to light and accommodation. No scleral icterus. Extraocular muscles intact.  HEENT: Head atraumatic, normocephalic. Oropharynx and nasopharynx clear.  NECK:  Supple, no jugular venous distention. No thyroid enlargement, no tenderness.  LUNGS: Normal breath sounds bilaterally, no wheezing, rales,rhonchi or crepitation. No use of accessory muscles of respiration. Fine bibasilar crackles CARDIOVASCULAR: S1, S2 normal. No murmurs, rubs, or gallops.  ABDOMEN: Soft, nontender, nondistended. Bowel sounds present. No organomegaly or mass.  EXTREMITIES: No cyanosis, or clubbing. No pedal edema present NEUROLOGIC: Cranial nerves II through XII are intact. Muscle strength 5/5 in all extremities. Sensation intact. Gait not checked.  PSYCHIATRIC:  The patient is alert and oriented x 3.  SKIN: No obvious rash, lesion, or ulcer.   DATA REVIEW:   CBC  Recent Labs Lab 10/19/16 0411  WBC 7.1  HGB 13.3  HCT 37.6*  PLT 188    Chemistries   Recent Labs Lab 10/18/16 1909  10/19/16 1024  10/22/16 0615  NA 138  < >  --   < > 135  K 3.9  < >  --   < > 3.7  CL 104  < >  --   < > 103  CO2 26  < >  --   < > 24  GLUCOSE 163*  < >  --   < > 89  BUN 23*  < >  --   < > 29*  CREATININE 1.50*  < >  --   < > 1.53*  CALCIUM 9.4  < >  --   < > 8.6*  MG  --   --  1.5*  --   --   AST 25  --   --   --   --   ALT 27  --   --   --   --   ALKPHOS 58  --   --   --   --   BILITOT 1.0  --   --   --   --   < > = values in this interval not displayed.   Microbiology Results  No results found for this or any previous visit.  RADIOLOGY:  No results found.   Management plans discussed with the patient, family and they are in agreement.  CODE STATUS:     Code Status Orders        Start     Ordered   10/18/16 2225  Full code  Continuous     10/18/16 2224    Code Status History    Date Active Date Inactive Code Status Order ID Comments User Context   This patient has a current code status but no historical code status.      TOTAL TIME TAKING CARE OF THIS PATIENT: 37 minutes.    Rayden Dock M.D on 10/22/2016 at 4:20 PM  Between 7am to 6pm - Pager - [phone]  After 6pm go to [email or web address] - Proofreader  [facility]  Office  [phone]  CC: Primary care physician; [name], DO   Note: This dictation was prepared with Dragon dictation along with smaller phrase technology. Any transcriptional errors that result from this process are unintentional.

## 2016-10-22 NOTE — Progress Notes (Signed)
Initial HF Clinic appointment scheduled on October 29, 2016 at 9:00am. Thank you for the referral.

## 2016-10-22 NOTE — Progress Notes (Signed)
Per patient's request, have cancelled his upcoming appointment at the HF Clinic.

## 2016-10-24 ENCOUNTER — Telehealth: Payer: Self-pay | Admitting: *Deleted

## 2016-10-24 NOTE — Telephone Encounter (Signed)
Left detailed voicemail message to please call back if he has any questions regarding his discharge instructions or medications and appointment is 10/28/16 at 3:20PM with Dr. Fletcher Anon and to bring medications to his appointment with instructions to call back if any further questions.

## 2016-10-24 NOTE — Telephone Encounter (Signed)
-----   Message from Blain Pais sent at 10/24/2016 12:20 PM EDT ----- Regarding: TCM/PH 4/16 3:20 DR Fletcher Anon

## 2016-10-28 ENCOUNTER — Ambulatory Visit (INDEPENDENT_AMBULATORY_CARE_PROVIDER_SITE_OTHER): Payer: Medicaid Other | Admitting: Cardiovascular Disease

## 2016-10-28 ENCOUNTER — Encounter: Payer: Self-pay | Admitting: Cardiovascular Disease

## 2016-10-28 ENCOUNTER — Other Ambulatory Visit: Payer: Self-pay | Admitting: *Deleted

## 2016-10-28 VITALS — BP 142/78 | HR 75 | Ht 68.0 in | Wt 194.5 lb

## 2016-10-28 DIAGNOSIS — I2510 Atherosclerotic heart disease of native coronary artery without angina pectoris: Secondary | ICD-10-CM | POA: Diagnosis not present

## 2016-10-28 DIAGNOSIS — E785 Hyperlipidemia, unspecified: Secondary | ICD-10-CM

## 2016-10-28 DIAGNOSIS — I5022 Chronic systolic (congestive) heart failure: Secondary | ICD-10-CM

## 2016-10-28 DIAGNOSIS — I10 Essential (primary) hypertension: Secondary | ICD-10-CM | POA: Diagnosis not present

## 2016-10-28 NOTE — Progress Notes (Signed)
Cardiology Office Note   Date:  10/28/2016   ID:  [name], DOB September 22, 1960, MRN [id number]  PCP:  [name], DO  Cardiologist:   [name], MD   Chief Complaint  Patient presents with  . other    [name] follow up. Patient denies chest pain and SOB at this time. Meds reviewed verbally wiht patient.      History of Present Illness: [name] is a 56 y.o. male who presents for a follow-up visit regarding chronic systolic heart failure and coronary artery disease. He has known history of refractory hypertension, stage III chronic kidney disease, diabetes mellitus with diabetic neuropathy and excessive alcohol use. He presented recently to [name] with progressive shortness of breath and leg edema. He was significantly hypertensive on presentation. EKG showed left bundle branch block of unknown duration. Troponin was borderline elevated. Echocardiogram showed severely reduced LV systolic function with an EF of 25-30% with mild mitral regurgitation and mildly dilated left atrium. I proceeded with cardiac catheterization via the right radial artery which showed severe one-vessel coronary artery disease involving the mid LAD with mildly elevated left ventricular end-diastolic pressure. I performed successful angioplasty and drug-eluting stent placement to the mid LAD without complications. Since [name] discharge, he has been doing very well with no chest pain, shortness of breath, orthopnea or leg edema. He has been taking his medications regularly with no reported side effects. He has not had any alcohol.    Past Medical History:  Diagnosis Date  . Ankle pain   . Chronic systolic heart failure ([place]) 10/2016   EF 25-30%  . CKD (chronic kidney disease), stage III   . Coronary artery disease    Cardiac catheterization in April 2018 showed severe one-vessel coronary artery disease involving the mid LAD. Status post PCI and drug-eluting stent placement. No  other obstructive disease.  . Diabetes mellitus with complication ([place])   . Diabetic neuropathy ([place])   . Hyperlipidemia   . Hypertension   . Pain in both feet     Past Surgical History:  Procedure Laterality Date  . CORONARY STENT INTERVENTION N/A 10/21/2016   Procedure: Coronary Stent Intervention;  Surgeon: Wellington Hampshire, MD;  Location: [facility];  Service: Cardiovascular;  Laterality: N/A;  . LEFT HEART CATH AND CORONARY ANGIOGRAPHY N/A 10/21/2016   Procedure: Left Heart Cath and Coronary Angiography;  Surgeon: Wellington Hampshire, MD;  Location: [facility];  Service: Cardiovascular;  Laterality: N/A;  . TIBIA FRACTURE SURGERY Right 2002     Current Outpatient Prescriptions  Medication Sig Dispense Refill  . acetaminophen (TYLENOL 8 HOUR) 650 MG CR tablet Take 1 tablet (650 mg total) by mouth every 8 (eight) hours as needed for pain.    . Alpha-Lipoic Acid 600 MG CAPS Take 1 capsule by mouth daily.    [name] amLODipine (NORVASC) 10 MG tablet Take 1 tablet (10 mg total) by mouth daily. 90 tablet 3  . aspirin EC 81 MG tablet Take 81 mg by mouth.    [name] atorvastatin (LIPITOR) 40 MG tablet Take 1 tablet (40 mg total) by mouth daily at 6 PM. 30 tablet 2  . Blood Pressure Monitoring (BLOOD PRESSURE CUFF) MISC 1 each by Does not apply route daily. 1 each 0  . carvedilol (COREG) 25 MG tablet Take 1 tablet (25 mg total) by mouth 2 (two) times daily with a meal. 60 tablet 2  . clopidogrel (PLAVIX) 75 MG tablet Take 1 tablet (  75 mg total) by mouth daily. 30 tablet 10  . furosemide (LASIX) 40 MG tablet Take 1 tablet (40 mg total) by mouth daily. 30 tablet 2  . gabapentin (NEURONTIN) 400 MG capsule Take 1 capsule (400 mg total) by mouth 3 (three) times daily. 90 capsule 0  . glucose blood test strip Use as instructed 100 each 12  . hydrALAZINE (APRESOLINE) 50 MG tablet Take 1 tablet (50 mg total) by mouth every 8 (eight) hours. 90 tablet 2  . Insulin Pen Needle (PEN NEEDLES  31GX5/16") 31G X 8 MM MISC Check blood sugar up to 3 times daily as advised. 100 each 11  . isosorbide mononitrate (IMDUR) 60 MG 24 hr tablet Take 1 tablet (60 mg total) by mouth daily. 30 tablet 2  . LANTUS SOLOSTAR 100 UNIT/ML Solostar Pen Inject 40 Units into the skin at bedtime. 100 mL 0  . metFORMIN (GLUCOPHAGE) 1000 MG tablet Take 1 tablet (1,000 mg total) by mouth 2 (two) times daily with a meal. 180 tablet 3   No current facility-administered medications for this visit.     Allergies:   Lisinopril and Terbinafine and related    Social History:  The patient  reports that he is a non-smoker but has been exposed to tobacco smoke. He has never used smokeless tobacco. He reports that he drinks alcohol. He reports that he does not use drugs.   Family History:  The patient's family history includes Heart disease in his maternal grandfather; Hypertension in his mother.    ROS:  Please see the history of present illness.   Otherwise, review of systems are positive for none.   All other systems are reviewed and negative.    PHYSICAL EXAM: VS:  BP (!) 142/78 (BP Location: Right Arm, Patient Position: Sitting, Cuff Size: Normal)   Pulse 75   Ht 5\' 8"  (1.727 m)   Wt 194 lb 8 oz (88.2 kg)   BMI 29.57 kg/m  , BMI Body mass index is 29.57 kg/m. GEN: Well nourished, well developed, in no acute distress  HEENT: normal  Neck: no JVD, carotid bruits, or masses Cardiac: RRR; no murmurs, rubs, or gallops,no edema  Respiratory:  clear to auscultation bilaterally, normal work of breathing GI: soft, nontender, nondistended, + BS MS: no deformity or atrophy  Skin: warm and dry, no rash Neuro:  Strength and sensation are intact Psych: euthymic mood, full affect Right radial pulse is normal with no hematoma  EKG:  EKG is ordered today. The ekg ordered today demonstrates normal sinus rhythm with left bundle branch block.   Recent Labs: 10/18/2016: ALT 27; B Natriuretic Peptide 541.0 10/19/2016:  Hemoglobin 13.3; Magnesium 1.5; Platelets 188 10/22/2016: BUN 29; Creatinine, Ser 1.53; Potassium 3.7; Sodium 135    Lipid Panel    Component Value Date/Time   CHOL 139 10/20/2016 0515   CHOL 219 (H) 08/28/2015 1135   TRIG 171 (H) 10/20/2016 0515   HDL 41 10/20/2016 0515   HDL 52 08/28/2015 1135   CHOLHDL 3.4 10/20/2016 0515   VLDL 34 10/20/2016 0515   LDLCALC 64 10/20/2016 0515   LDLCALC 107 (H) 08/28/2015 1135      Wt Readings from Last 3 Encounters:  10/28/16 194 lb 8 oz (88.2 kg)  10/22/16 199 lb 14.4 oz (90.7 kg)  08/22/16 201 lb (91.2 kg)       PAD Screen 10/28/2016  Previous PAD dx? No  Previous surgical procedure? No  Pain with walking? No  Feet/toe relief with  dangling? No  Painful, non-healing ulcers? No  Extremities discolored? No      ASSESSMENT AND PLAN:  1.  Coronary artery disease involving native coronary arteries without angina: He is doing very well overall after recent hospitalization for acute systolic heart failure and unstable angina. He has no symptoms at the present time. I recommend continuing dual antiplatelet therapy for at least one year. I referred him to cardiac rehabilitation and discussed with him the importance of healthy lifestyle changes.  2. Chronic systolic heart failure: He appears to be euvolemic on current dose of furosemide 40 mg once daily. He is not on an ACE inhibitor or ARB due to chronic kidney disease but we might be able to start him on a small dose in the near future if renal function remained stable. He has a follow-up appointment with nephrology later this week. I advised him to avoid alcohol drinking altogether given his severe cardiomyopathy.  3. Essential hypertension: Blood pressure is reasonably controlled on current medications.  4. Hyperlipidemia: Continue treatment with atorvastatin. He will need a follow-up lipid and liver profile in one month.    Disposition:   FU with me in 1 month  Signed,  [name], MD  10/28/2016 4:02 PM    [facility]

## 2016-10-28 NOTE — Patient Instructions (Signed)
Medication Instructions:  Your physician recommends that you continue on your current medications as directed. Please refer to the Current Medication list given to you today.   Labwork: none  Testing/Procedures: none  Follow-Up: Your physician recommends that you schedule a follow-up appointment in: one month with Dr. Fletcher Anon.    Any Other Special Instructions Will Be Listed Below (If Applicable). You will receive a call from Cardiac Rehab to set up appointments.      If you need a refill on your cardiac medications before your next appointment, please call your pharmacy.

## 2016-10-29 ENCOUNTER — Ambulatory Visit: Payer: Medicaid Other | Admitting: Family

## 2016-11-06 ENCOUNTER — Encounter: Payer: Self-pay | Admitting: Family Medicine

## 2016-11-06 ENCOUNTER — Ambulatory Visit (INDEPENDENT_AMBULATORY_CARE_PROVIDER_SITE_OTHER): Payer: Medicaid Other | Admitting: Family Medicine

## 2016-11-06 VITALS — BP 102/61 | HR 63 | Temp 97.7°F | Resp 16 | Ht 68.0 in | Wt 203.1 lb

## 2016-11-06 DIAGNOSIS — N183 Chronic kidney disease, stage 3 unspecified: Secondary | ICD-10-CM

## 2016-11-06 DIAGNOSIS — R778 Other specified abnormalities of plasma proteins: Secondary | ICD-10-CM

## 2016-11-06 DIAGNOSIS — I5021 Acute systolic (congestive) heart failure: Secondary | ICD-10-CM | POA: Diagnosis not present

## 2016-11-06 DIAGNOSIS — F1010 Alcohol abuse, uncomplicated: Secondary | ICD-10-CM

## 2016-11-06 DIAGNOSIS — R748 Abnormal levels of other serum enzymes: Secondary | ICD-10-CM

## 2016-11-06 DIAGNOSIS — R7989 Other specified abnormal findings of blood chemistry: Secondary | ICD-10-CM

## 2016-11-06 DIAGNOSIS — E1142 Type 2 diabetes mellitus with diabetic polyneuropathy: Secondary | ICD-10-CM | POA: Diagnosis not present

## 2016-11-06 DIAGNOSIS — I5022 Chronic systolic (congestive) heart failure: Secondary | ICD-10-CM | POA: Diagnosis not present

## 2016-11-06 DIAGNOSIS — I10 Essential (primary) hypertension: Secondary | ICD-10-CM

## 2016-11-06 NOTE — Progress Notes (Signed)
Subjective:    Patient ID: [name], male    DOB: May 26, 1961, 56 y.o.   MRN: [id number]  [name] is a 56 y.o. male presenting on 11/06/2016 for Hospitalization Follow-up (Patient here today to follow-up on hospital admission on 10/18/16 at [facility] due to worsening lower edema and dyspnea. Patient was advised to follow up with cardio and was seen by Dr. Fletcher Anon on 10/28/16. Patient denies any chest pain, shortness of breath or edema. )   HPI  Specialists Cardiology - Dr Fletcher Anon [facility] Cardiology) Nephrology - Dr Holley Raring ([place])  Hospital Follow-up - admitted 10/18/16 and discharged 10/22/16  FOLLOW-UP Acute CHF Exacerbation (Systolic and Diastolic) - reviewed discharge summary in detail, patient reports today that he presented to hospital with shortness of breath and leg swelling, overall treated with IV diuresis with good results, and by Cardiology see below. He was discharged on adjusted medication regimen. - Today he reports overall doing much better, breathing well and swelling is resolved. Tolerating meds - Weight is up 3 lbs from discharge weight approx 3 weeks later.  CAD s/p cardiac cath and PCI with 90% LAD stenosis / Dilated Cardiomyopathy - Followed by Cardiology in hospital - Found to have elevated troponin, had ECHO with diffuse hypokinesis, EF 25%, taken to Cardiac Cath, s/p drug eluting stent in hospital, started on DAPT added Plavix 75mg  daily to his existing ASA, per Cardiology recommend DAPT for at least 1 year - He has not started Cardiac Rehab, but is more active now with exercise and yardwork - He was restarted on ARB by Nephrology, and they are monitoring lab - Started on Imdur  CHRONIC DM, Type 2 with DM Neuropathy Prior to hospital A1c up to 9, and he was started on Bydureon, he had difficulty with this pen and did not like it, may have had some GI intolerance with nausea. He stopped taking it. In hospital A1c improved to 6.8. He was restarted on Lantus  and Metformin, now taking Lantus 40u nightly and Metformin 1000mg  BID - On ARB Lifestyle: Diet (significantly improved DM diet now) Denies hypoglycemia, polyuria, visual changes.  HTN Followed by Nephrology outside hospital previously and on discharge. Amlodipine was discontinued on discharge and Lasix was added. Previously, he was referred to Nephro due to concerns of resistant HTN (after subsequent aldosterone to renin ratio was normal), now suspected to be more essential HTN. He was restarted back on ARB. Labs were drawn to monitor for K, as had prior history of hyperK  CKD-III During hospitalization Cr had remained mostly stable near baseline 1.4 to 1.5.  History of Alcohol Abuse Patient remains alcohol free for past 1 month, and has done well, improving diet and lifestyle. In hospital, with dilated cardiomyopathy thought may be related to alcohol use.   Social History  Substance Use Topics  . Smoking status: Passive Smoke Exposure - Never Smoker  . Smokeless tobacco: Never Used  . Alcohol use Yes    Review of Systems  Constitutional: Negative for activity change, appetite change, chills, diaphoresis, fatigue, fever and unexpected weight change.  HENT: Negative for congestion.   Eyes: Negative for visual disturbance.  Respiratory: Negative for apnea, cough, chest tightness, shortness of breath and wheezing.   Cardiovascular: Negative for chest pain, palpitations and leg swelling.  Gastrointestinal: Negative for abdominal pain, constipation, diarrhea, nausea and vomiting.  Endocrine: Negative for cold intolerance and polyuria.  Genitourinary: Negative for frequency.  Musculoskeletal: Negative for arthralgias and neck pain.  Skin: Negative  for rash.  Neurological: Negative for dizziness, weakness, light-headedness, numbness and headaches.  Psychiatric/Behavioral: Negative for behavioral problems, dysphoric mood and sleep disturbance.   Per HPI unless specifically indicated  above     Objective:    BP 102/61 (BP Location: Left Arm, Patient Position: Sitting, Cuff Size: Large)   Pulse 63   Temp 97.7 F (36.5 C) (Oral)   Resp 16   Ht 5\' 8"  (1.727 m)   Wt 203 lb 1.6 oz (92.1 kg)   SpO2 99%   BMI 30.88 kg/m   Wt Readings from Last 3 Encounters:  11/06/16 203 lb 1.6 oz (92.1 kg)  10/28/16 194 lb 8 oz (88.2 kg)  10/22/16 199 lb 14.4 oz (90.7 kg)    Physical Exam  Constitutional: He is oriented to person, place, and time. He appears well-developed and well-nourished. No distress.  Well-appearing, comfortable, cooperative  HENT:  Head: Normocephalic and atraumatic.  Mouth/Throat: Oropharynx is clear and moist.  Eyes: Conjunctivae are normal.  Neck: Normal range of motion. Neck supple. No thyromegaly present.  No carotid bruits  Cardiovascular: Normal rate, regular rhythm, normal heart sounds and intact distal pulses.   No murmur heard. Pulmonary/Chest: Effort normal and breath sounds normal. No respiratory distress. He has no wheezes. He has no rales.  Good air movement  Musculoskeletal: He exhibits edema (Improved, now minimal trace pedal/ ankle edema non pitting).  Lymphadenopathy:    He has no cervical adenopathy.  Neurological: He is alert and oriented to person, place, and time.  Skin: Skin is warm and dry. No rash noted. He is not diaphoretic. No erythema.  Psychiatric: He has a normal mood and affect. His behavior is normal.  Well groomed, good eye contact, normal speech and thoughts  Nursing note and vitals reviewed.  I have personally reviewed the report from Transthoracic ECHO on 10/19/16.  Transthoracic Echocardiography  Patient:    Finlee, Milo MR #:       [id number] Study Date: 10/19/2016 Gender:     M Age:        43 Height:     175.3 cm Weight:     91.5 kg BSA:        2.13 m^2 Pt. Status: Room:       257A   ADMITTING    Dayna Ramus  SONOGRAPHER  Irish Elders  Stills RDCS  ATTENDING    Gladstone Lighter  PERFORMING   [facility]  cc:  ------------------------------------------------------------------- LV EF: 25% -   30%  ------------------------------------------------------------------- Indications:      CHF 428.0.  ------------------------------------------------------------------- Study Conclusions  - Procedure narrative: Transthoracic echocardiography. Image   quality was poor. The study was technically difficult, as a   result of poor acoustic windows and poor sound wave transmission.   Intravenous contrast (Definity) was administered. - Left ventricle: The cavity size was mildly dilated. Systolic   function was severely reduced. The estimated ejection fraction   was in the range of 25% to 30%. Diffuse hypokinesis. - Mitral valve: There was mild regurgitation. - Left atrium: The atrium was mildly dilated.  ------------------------------------------------------------------- Study data:   Study status:  Routine.  Procedure:  Transthoracic echocardiography. Image quality was poor. The study was technically difficult, as a result of poor acoustic windows and poor sound wave transmission. Intravenous contrast (Definity) was administered.      Transthoracic echocardiography.  M-mode, complete 2D, spectral Doppler,  and color Doppler.  Birthdate:  Patient birthdate: 16-Feb-1961.  Age:  Patient is 56 yr old.  Sex:  Gender: male. BMI: 29.8 kg/m^2.  Blood pressure:     157/89  Patient status: Inpatient.  Study date:  Study date: 10/19/2016. Study time: 07:57 AM.  -------------------------------------------------------------------  ------------------------------------------------------------------- Left ventricle:  The cavity size was mildly dilated. Systolic function was severely reduced. The estimated ejection fraction was in the range of 25% to 30%. Diffuse  hypokinesis.  ------------------------------------------------------------------- Aortic valve:   Structurally normal valve.   Cusp separation was normal.  Doppler:  Transvalvular velocity was within the normal range. There was no stenosis. There was no regurgitation.  ------------------------------------------------------------------- Aorta:  The aorta was normal, not dilated, and non-diseased.  ------------------------------------------------------------------- Mitral valve:  Poorly visualized.  Doppler:  There was mild regurgitation.  ------------------------------------------------------------------- Left atrium:  The atrium was mildly dilated.  ------------------------------------------------------------------- Right ventricle:  The cavity size was normal. Wall thickness was normal. Systolic function was normal.  ------------------------------------------------------------------- Tricuspid valve:   Doppler:  There was mild regurgitation.  ------------------------------------------------------------------- Right atrium:  Poorly visualized.  ------------------------------------------------------------------- Post procedure conclusions Ascending Aorta:  - The aorta was normal, not dilated, and non-diseased.  Results for orders placed or performed during the hospital encounter of 10/18/16  CBC with Differential  Result Value Ref Range   WBC 7.6 3.8 - 10.6 K/uL   RBC 3.95 (L) 4.40 - 5.90 MIL/uL   Hemoglobin 13.4 13.0 - 18.0 g/dL   HCT 38.3 (L) 40.0 - 52.0 %   MCV 97.0 80.0 - 100.0 fL   MCH 33.9 26.0 - 34.0 pg   MCHC 34.9 32.0 - 36.0 g/dL   RDW 12.8 11.5 - 14.5 %   Platelets 196 150 - 440 K/uL   Neutrophils Relative % 62 %   Neutro Abs 4.7 1.4 - 6.5 K/uL   Lymphocytes Relative 21 %   Lymphs Abs 1.6 1.0 - 3.6 K/uL   Monocytes Relative 11 %   Monocytes Absolute 0.8 0.2 - 1.0 K/uL   Eosinophils Relative 5 %   Eosinophils Absolute 0.4 0 - 0.7 K/uL   Basophils  Relative 1 %   Basophils Absolute 0.1 0 - 0.1 K/uL  Comprehensive metabolic panel  Result Value Ref Range   Sodium 138 135 - 145 mmol/L   Potassium 3.9 3.5 - 5.1 mmol/L   Chloride 104 101 - 111 mmol/L   CO2 26 22 - 32 mmol/L   Glucose, Bld 163 (H) 65 - 99 mg/dL   BUN 23 (H) 6 - 20 mg/dL   Creatinine, Ser 1.50 (H) 0.61 - 1.24 mg/dL   Calcium 9.4 8.9 - 10.3 mg/dL   Total Protein 7.5 6.5 - 8.1 g/dL   Albumin 3.8 3.5 - 5.0 g/dL   AST 25 15 - 41 U/L   ALT 27 17 - 63 U/L   Alkaline Phosphatase 58 38 - 126 U/L   Total Bilirubin 1.0 0.3 - 1.2 mg/dL   GFR calc non Af Amer 50 (L) >60 mL/min   GFR calc Af Amer 58 (L) >60 mL/min   Anion gap 8 5 - 15  Troponin I  Result Value Ref Range   Troponin I <0.03 <0.03 ng/mL  Brain natriuretic peptide  Result Value Ref Range   B Natriuretic Peptide 541.0 (H) 0.0 - 100.0 pg/mL  Troponin I  Result Value Ref Range   Troponin I 0.03 (HH) <0.03 ng/mL  Troponin I  Result Value Ref Range   Troponin I 0.03 (HH) <  0.03 ng/mL  Troponin I  Result Value Ref Range   Troponin I <0.03 <0.03 ng/mL  Hemoglobin A1c  Result Value Ref Range   Hgb A1c MFr Bld 6.8 (H) 4.8 - 5.6 %   Mean Plasma Glucose 148 mg/dL  Basic metabolic panel  Result Value Ref Range   Sodium 140 135 - 145 mmol/L   Potassium 3.2 (L) 3.5 - 5.1 mmol/L   Chloride 102 101 - 111 mmol/L   CO2 31 22 - 32 mmol/L   Glucose, Bld 127 (H) 65 - 99 mg/dL   BUN 22 (H) 6 - 20 mg/dL   Creatinine, Ser 1.46 (H) 0.61 - 1.24 mg/dL   Calcium 9.2 8.9 - 10.3 mg/dL   GFR calc non Af Amer 52 (L) >60 mL/min   GFR calc Af Amer 60 (L) >60 mL/min   Anion gap 7 5 - 15  CBC  Result Value Ref Range   WBC 7.1 3.8 - 10.6 K/uL   RBC 3.88 (L) 4.40 - 5.90 MIL/uL   Hemoglobin 13.3 13.0 - 18.0 g/dL   HCT 37.6 (L) 40.0 - 52.0 %   MCV 97.0 80.0 - 100.0 fL   MCH 34.3 (H) 26.0 - 34.0 pg   MCHC 35.3 32.0 - 36.0 g/dL   RDW 13.1 11.5 - 14.5 %   Platelets 188 150 - 440 K/uL  Glucose, capillary  Result Value Ref Range    Glucose-Capillary 146 (H) 65 - 99 mg/dL  Glucose, capillary  Result Value Ref Range   Glucose-Capillary 144 (H) 65 - 99 mg/dL   Comment 1 Notify RN   Magnesium  Result Value Ref Range   Magnesium 1.5 (L) 1.7 - 2.4 mg/dL  Glucose, capillary  Result Value Ref Range   Glucose-Capillary 169 (H) 65 - 99 mg/dL   Comment 1 Notify RN   Glucose, capillary  Result Value Ref Range   Glucose-Capillary 157 (H) 65 - 99 mg/dL   Comment 1 Notify RN   Basic metabolic panel  Result Value Ref Range   Sodium 137 135 - 145 mmol/L   Potassium 3.7 3.5 - 5.1 mmol/L   Chloride 99 (L) 101 - 111 mmol/L   CO2 30 22 - 32 mmol/L   Glucose, Bld 132 (H) 65 - 99 mg/dL   BUN 27 (H) 6 - 20 mg/dL   Creatinine, Ser 1.48 (H) 0.61 - 1.24 mg/dL   Calcium 9.0 8.9 - 10.3 mg/dL   GFR calc non Af Amer 51 (L) >60 mL/min   GFR calc Af Amer 59 (L) >60 mL/min   Anion gap 8 5 - 15  Glucose, capillary  Result Value Ref Range   Glucose-Capillary 167 (H) 65 - 99 mg/dL  Lipid panel  Result Value Ref Range   Cholesterol 139 0 - 200 mg/dL   Triglycerides 171 (H) <150 mg/dL   HDL 41 >40 mg/dL   Total CHOL/HDL Ratio 3.4 RATIO   VLDL 34 0 - 40 mg/dL   LDL Cholesterol 64 0 - 99 mg/dL  Glucose, capillary  Result Value Ref Range   Glucose-Capillary 129 (H) 65 - 99 mg/dL   Comment 1 Notify RN   Glucose, capillary  Result Value Ref Range   Glucose-Capillary 135 (H) 65 - 99 mg/dL   Comment 1 Notify RN   Basic metabolic panel  Result Value Ref Range   Sodium 135 135 - 145 mmol/L   Potassium 3.7 3.5 - 5.1 mmol/L   Chloride 102 101 - 111 mmol/L  CO2 26 22 - 32 mmol/L   Glucose, Bld 110 (H) 65 - 99 mg/dL   BUN 29 (H) 6 - 20 mg/dL   Creatinine, Ser 1.49 (H) 0.61 - 1.24 mg/dL   Calcium 9.3 8.9 - 10.3 mg/dL   GFR calc non Af Amer 50 (L) >60 mL/min   GFR calc Af Amer 58 (L) >60 mL/min   Anion gap 7 5 - 15  Glucose, capillary  Result Value Ref Range   Glucose-Capillary 137 (H) 65 - 99 mg/dL   Comment 1 Notify RN   Glucose,  capillary  Result Value Ref Range   Glucose-Capillary 144 (H) 65 - 99 mg/dL  Glucose, capillary  Result Value Ref Range   Glucose-Capillary 98 65 - 99 mg/dL  Glucose, capillary  Result Value Ref Range   Glucose-Capillary 115 (H) 65 - 99 mg/dL  Basic metabolic panel  Result Value Ref Range   Sodium 135 135 - 145 mmol/L   Potassium 3.7 3.5 - 5.1 mmol/L   Chloride 103 101 - 111 mmol/L   CO2 24 22 - 32 mmol/L   Glucose, Bld 89 65 - 99 mg/dL   BUN 29 (H) 6 - 20 mg/dL   Creatinine, Ser 1.53 (H) 0.61 - 1.24 mg/dL   Calcium 8.6 (L) 8.9 - 10.3 mg/dL   GFR calc non Af Amer 49 (L) >60 mL/min   GFR calc Af Amer 57 (L) >60 mL/min   Anion gap 8 5 - 15  Glucose, capillary  Result Value Ref Range   Glucose-Capillary 85 65 - 99 mg/dL  Glucose, capillary  Result Value Ref Range   Glucose-Capillary 90 65 - 99 mg/dL  Glucose, capillary  Result Value Ref Range   Glucose-Capillary 86 65 - 99 mg/dL  POCT Activated clotting time  Result Value Ref Range   Activated Clotting Time 235 seconds  Echocardiogram  Result Value Ref Range   Weight 3,227.2 oz   Height 69 in   BP 157/89 mmHg      Assessment & Plan:   Problem List Items Addressed This Visit    Resistant hypertension    Improved BP control following hospitalization. Less likely resistant HTN due to abnormal aldosterone to renin, since repeat lab normal for Nephrology, thought to be essential HTN - Discharge med changes: Off Amlodipine due to swelling, changed to Lasix regimen for CHF, held ARB until Renal 4/18 restarted  Plan: 1. Continue current regimen now improved back on Losartan 50, per renal checking chemistry for K today, awaiting report and results 2. Continue improved diet, low sodium, more regular exercise 3. Follow-up as needed      Relevant Medications   losartan (COZAAR) 50 MG tablet   RESOLVED: Elevated troponin    Borderline elevated troponin per Cardiology, likely demand ischemia with acute CHF exac Followed by  Cardiology for further CAD management      DM type 2 with diabetic peripheral neuropathy ([place])    Improved control with A1c back < 7.0 now 6.8, unclear if 9.3 was inaccurate reading. Prior poor lifestyle habits limited diet / exercise, now significantly improved after hospitalization - Concern self discontinued Bydureon due to difficulty with pen and some nausea GI intolerance  Plan: 1. Continue current Lantus 40u nightly and Metformin 1000mg  BID 2. Continue improved DM diet and exercise, remain alcohol free which likely has helped reduce A1c 3. Continue ARB, statin, ASA 4. Follow-up 3 months DM A1c      Relevant Medications   losartan (COZAAR) 50  MG tablet   Chronic systolic heart failure (HCC)    Resolved Acute CHF exacerbation in hospital with IV diuresis, now on Lasix regimen, among other CHF med management. Followed by Cardiology, close f/u to monitor ECHO with reduced EF, concern some ischemic cardiomyopathy vs alcohol induced - Clinically mostly euvolemic today, only +3 lbs in 3 weeks      Relevant Medications   losartan (COZAAR) 50 MG tablet   Chronic kidney disease (CKD), stage III (moderate)    Stable CKD-III, following Acute CHF exac now resolved Followed by Nephrology Has resumed ARB, and on lasix regimen now Control BP, DM Follow-up as needed      RESOLVED: Acute systolic CHF (congestive heart failure) ([place]) - Primary    Resolved Acute CHF exacerbation in hospital with IV diuresis, now on Lasix regimen, among other CHF med management. Followed by Cardiology, close f/u to monitor ECHO with reduced EF, concern some ischemic cardiomyopathy vs alcohol induced - Clinically mostly euvolemic today, only +3 lbs in 3 weeks      Relevant Medications   losartan (COZAAR) 50 MG tablet   AA (alcohol abuse)    Encouraged to remain alcohol free now 1 month Improved lifestyle overall Suspect alcohol contributed to dilated cardiomyopathy and hyperglycemia        I have  reviewed the discharge medication list, and have reconciled the current and discharge medications today.    Current Outpatient Prescriptions:  .  acetaminophen (TYLENOL 8 HOUR) 650 MG CR tablet, Take 1 tablet (650 mg total) by mouth every 8 (eight) hours as needed for pain., Disp: , Rfl:  .  Alpha-Lipoic Acid 600 MG CAPS, Take 1 capsule by mouth daily., Disp: , Rfl:  .  aspirin EC 81 MG tablet, Take 81 mg by mouth., Disp: , Rfl:  .  atorvastatin (LIPITOR) 40 MG tablet, Take 1 tablet (40 mg total) by mouth daily at 6 PM., Disp: 30 tablet, Rfl: 2 .  Blood Pressure Monitoring (BLOOD PRESSURE CUFF) MISC, 1 each by Does not apply route daily., Disp: 1 each, Rfl: 0 .  carvedilol (COREG) 25 MG tablet, Take 1 tablet (25 mg total) by mouth 2 (two) times daily with a meal., Disp: 60 tablet, Rfl: 2 .  clopidogrel (PLAVIX) 75 MG tablet, Take 1 tablet (75 mg total) by mouth daily., Disp: 30 tablet, Rfl: 10 .  furosemide (LASIX) 40 MG tablet, Take 1 tablet (40 mg total) by mouth daily., Disp: 30 tablet, Rfl: 2 .  gabapentin (NEURONTIN) 400 MG capsule, Take 1 capsule (400 mg total) by mouth 3 (three) times daily., Disp: 90 capsule, Rfl: 0 .  glucose blood test strip, Use as instructed, Disp: 100 each, Rfl: 12 .  hydrALAZINE (APRESOLINE) 50 MG tablet, Take 1 tablet (50 mg total) by mouth every 8 (eight) hours., Disp: 90 tablet, Rfl: 2 .  Insulin Pen Needle (PEN NEEDLES 31GX5/16") 31G X 8 MM MISC, Check blood sugar up to 3 times daily as advised., Disp: 100 each, Rfl: 11 .  isosorbide mononitrate (IMDUR) 60 MG 24 hr tablet, Take 1 tablet (60 mg total) by mouth daily., Disp: 30 tablet, Rfl: 2 .  LANTUS SOLOSTAR 100 UNIT/ML Solostar Pen, Inject 40 Units into the skin at bedtime., Disp: 100 mL, Rfl: 0 .  losartan (COZAAR) 50 MG tablet, Take 50 mg by mouth daily., Disp: , Rfl: 12 .  metFORMIN (GLUCOPHAGE) 1000 MG tablet, Take 1 tablet (1,000 mg total) by mouth 2 (two) times daily with a meal., Disp:  180 tablet, Rfl:  3  Follow up plan: Return in about 3 months (around 02/05/2017) for diabetes, blood pressure.    [name], [facility] 11/07/2016, 12:02 AM

## 2016-11-06 NOTE — Assessment & Plan Note (Signed)
Borderline elevated troponin per Cardiology, likely demand ischemia with acute CHF exac Followed by Cardiology for further CAD management

## 2016-11-06 NOTE — Patient Instructions (Signed)
Thank you for coming in to clinic today.  1. Keep up the good work with improved diet - Try eat low carb, low sugar, low sodium - Improve water intake - Congratulations on remaining alcohol free now for over 1 month - Try to stay active with regular exercise, walking  2. Continue current medications. No changes today - In future if A1c Diabetes is worsening, we can resume the Exenatide (Bydureon) injectable if needed - Continue current insulin dose for now  Follow-up as planned with Cardiology and Nephrology  If you develop significant worsening swelling in legs again and shortness of breath, and weight gain in short amount of time, please call cardiology office or seek immediate medical attention  Please schedule a follow-up appointment with Dr. Parks Ranger in 3 months for Diabetes A1c, HTN  If you have any other questions or concerns, please feel free to call the clinic or send a message through [facility]. You may also schedule an earlier appointment if necessary.  [name], DO [place]

## 2016-11-06 NOTE — Assessment & Plan Note (Signed)
Resolved Acute CHF exacerbation in hospital with IV diuresis, now on Lasix regimen, among other CHF med management. Followed by Cardiology, close f/u to monitor ECHO with reduced EF, concern some ischemic cardiomyopathy vs alcohol induced - Clinically mostly euvolemic today, only +3 lbs in 3 weeks

## 2016-11-06 NOTE — Assessment & Plan Note (Signed)
Improved BP control following hospitalization. Less likely resistant HTN due to abnormal aldosterone to renin, since repeat lab normal for Nephrology, thought to be essential HTN - Discharge med changes: Off Amlodipine due to swelling, changed to Lasix regimen for CHF, held ARB until Renal 4/18 restarted  Plan: 1. Continue current regimen now improved back on Losartan 50, per renal checking chemistry for K today, awaiting report and results 2. Continue improved diet, low sodium, more regular exercise 3. Follow-up as needed

## 2016-11-07 NOTE — Assessment & Plan Note (Addendum)
Improved control with A1c back < 7.0 now 6.8, unclear if 9.3 was inaccurate reading. Prior poor lifestyle habits limited diet / exercise, now significantly improved after hospitalization - Concern self discontinued Bydureon due to difficulty with pen and some nausea GI intolerance  Plan: 1. Continue current Lantus 40u nightly and Metformin 1000mg  BID 2. Continue improved DM diet and exercise, remain alcohol free which likely has helped reduce A1c 3. Continue ARB, statin, ASA 4. Follow-up 3 months DM A1c

## 2016-11-07 NOTE — Assessment & Plan Note (Signed)
Encouraged to remain alcohol free now 1 month Improved lifestyle overall Suspect alcohol contributed to dilated cardiomyopathy and hyperglycemia

## 2016-11-07 NOTE — Assessment & Plan Note (Signed)
Stable CKD-III, following Acute CHF exac now resolved Followed by Nephrology Has resumed ARB, and on lasix regimen now Control BP, DM Follow-up as needed

## 2016-11-25 ENCOUNTER — Ambulatory Visit: Payer: Medicaid Other | Admitting: Cardiovascular Disease

## 2016-11-27 ENCOUNTER — Ambulatory Visit: Payer: Medicaid Other | Admitting: Family Medicine

## 2016-12-18 ENCOUNTER — Ambulatory Visit (INDEPENDENT_AMBULATORY_CARE_PROVIDER_SITE_OTHER): Payer: Medicaid Other | Admitting: Nurse Practitioner

## 2016-12-18 ENCOUNTER — Encounter: Payer: Self-pay | Admitting: Nurse Practitioner

## 2016-12-18 VITALS — BP 140/80 | HR 66 | Ht 68.0 in | Wt 199.0 lb

## 2016-12-18 DIAGNOSIS — N183 Chronic kidney disease, stage 3 unspecified: Secondary | ICD-10-CM

## 2016-12-18 DIAGNOSIS — I420 Dilated cardiomyopathy: Secondary | ICD-10-CM | POA: Diagnosis not present

## 2016-12-18 DIAGNOSIS — I2510 Atherosclerotic heart disease of native coronary artery without angina pectoris: Secondary | ICD-10-CM

## 2016-12-18 DIAGNOSIS — I110 Hypertensive heart disease with heart failure: Secondary | ICD-10-CM | POA: Diagnosis not present

## 2016-12-18 DIAGNOSIS — I5022 Chronic systolic (congestive) heart failure: Secondary | ICD-10-CM

## 2016-12-18 DIAGNOSIS — E785 Hyperlipidemia, unspecified: Secondary | ICD-10-CM | POA: Diagnosis not present

## 2016-12-18 MED ORDER — LOSARTAN POTASSIUM 100 MG PO TABS: 100.0000 mg | ORAL_TABLET | Freq: Every day | ORAL | 3 refills | Status: DC

## 2016-12-18 NOTE — Progress Notes (Signed)
Office Visit    Patient Name: [name] Date of Encounter: 12/18/2016  Primary Care Provider:  Olin Hauser, DO Primary Cardiologist:  Jerilynn Mages. Fletcher Anon, MD   Chief Complaint    56 year old male with a history of CAD status post LAD drug-eluting stent placement in April 2018, mixed ischemic and nonischemic cardiomyopathy, diabetes, hypertension, hyperlipidemia, and stage III chronic kidney disease, who presents for follow-up.  Past Medical History    Past Medical History:  Diagnosis Date  . Ankle pain   . Chronic systolic heart failure ([place]) 10/2016   a. 10/2016 Echo: EF 25-30%, diff HK, mild MR.  . CKD (chronic kidney disease), stage III   . Coronary artery disease    a. 10/2016 Cath/PCI: LAD 73m (3.5x15 Xience Alpine DES). No other obstructive disease.  . Diabetes mellitus with complication ([place])   . Diabetic neuropathy ([place])   . Hyperlipidemia   . Hypertension   . 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 10/2016 Echo: EF 25-30%, diff HK.  . Pain in both feet    Past Surgical History:  Procedure Laterality Date  . CORONARY STENT INTERVENTION N/A 10/21/2016   Procedure: Coronary Stent Intervention;  Surgeon: Wellington Hampshire, MD;  Location: [facility];  Service: Cardiovascular;  Laterality: N/A;  . LEFT HEART CATH AND CORONARY ANGIOGRAPHY N/A 10/21/2016   Procedure: Left Heart Cath and Coronary Angiography;  Surgeon: Wellington Hampshire, MD;  Location: [facility];  Service: Cardiovascular;  Laterality: N/A;  . TIBIA FRACTURE SURGERY Right 2002    Allergies  Allergies  Allergen Reactions  . Lisinopril     Hyperkalemia   . Terbinafine And Related Rash    History of Present Illness    56 year old male with the above complex past medical history. He is status post drug-eluting stent placement to LAD in April 2018. At that time, he was found to have severe LV dysfunction with an EF of 25-30%. Diffuse hypokinesis was noted  and felt to be out of proportion to his LAD disease. He also has a history of alcohol abuse. Other history includes hypertension, hyperlipidemia, diabetes, neuropathy, and stage III chronic kidney disease. He was last seen in cardiology clinic in April and says he has done well since then. He does not weigh himself regularly but has not been experiencing any chest pain, dyspnea, palpitations, PND, orthopnea, dizziness, syncope, edema, or early satiety. He reports compliance with his medications and notes that his nephrologist placed him on losartan therapy. As far as he knows, he has not had any hyperkalemia (prior history of hyperkalemia in the setting of lisinopril therapy). He has cut way back on drinking but continues to have at least 3 beers each weekend. He will have fast food a few times a week.  Home Medications    Prior to Admission medications   Medication Sig Start Date End Date Taking? Authorizing Provider  acetaminophen (TYLENOL 8 HOUR) 650 MG CR tablet Take 1 tablet (650 mg total) by mouth every 8 (eight) hours as needed for pain. 08/28/15  Yes Krebs, Amy Lauren, NP  Alpha-Lipoic Acid 600 MG CAPS Take 1 capsule by mouth daily.   Yes [provider]  aspirin EC 81 MG tablet Take 81 mg by mouth. 09/22/07  Yes [provider]  atorvastatin (LIPITOR) 40 MG tablet Take 1 tablet (40 mg total) by mouth daily at 6 PM. 10/22/16  Yes Gladstone Lighter, MD  Blood Pressure  Monitoring (BLOOD PRESSURE CUFF) MISC 1 each by Does not apply route daily. 02/12/16  Yes Krebs, Amy Lauren, NP  carvedilol (COREG) 25 MG tablet Take 1 tablet (25 mg total) by mouth 2 (two) times daily with a meal. 10/22/16  Yes Gladstone Lighter, MD  clopidogrel (PLAVIX) 75 MG tablet Take 1 tablet (75 mg total) by mouth daily. 10/23/16  Yes Gladstone Lighter, MD  furosemide (LASIX) 40 MG tablet Take 1 tablet (40 mg total) by mouth daily. 10/23/16  Yes Gladstone Lighter, MD  gabapentin (NEURONTIN) 400 MG capsule Take  1 capsule (400 mg total) by mouth 3 (three) times daily. 10/22/16  Yes Gladstone Lighter, MD  glucose blood test strip Use as instructed 09/12/16  Yes Karamalegos, Devonne Doughty, DO  hydrALAZINE (APRESOLINE) 50 MG tablet Take 1 tablet (50 mg total) by mouth every 8 (eight) hours. 10/22/16  Yes Gladstone Lighter, MD  Insulin Pen Needle (PEN NEEDLES 31GX5/16") 31G X 8 MM MISC Check blood sugar up to 3 times daily as advised. 07/23/16  Yes Karamalegos, Devonne Doughty, DO  isosorbide mononitrate (IMDUR) 60 MG 24 hr tablet Take 1 tablet (60 mg total) by mouth daily. 10/23/16  Yes Gladstone Lighter, MD  LANTUS SOLOSTAR 100 UNIT/ML Solostar Pen Inject 40 Units into the skin at bedtime. 10/22/16  Yes Gladstone Lighter, MD  metFORMIN (GLUCOPHAGE) 1000 MG tablet Take 1 tablet (1,000 mg total) by mouth 2 (two) times daily with a meal. 01/22/16 01/21/17 Yes Krebs, Amy Lauren, NP  losartan (COZAAR) 100 MG tablet Take 1 tablet (100 mg total) by mouth daily. 12/18/16 03/18/17  Rogelia Mire, NP    Review of Systems    He denies chest pain, palpitations, dyspnea, pnd, orthopnea, n, v, dizziness, syncope, edema, weight gain, or early satiety.  All other systems reviewed and are otherwise negative except as noted above.  Physical Exam    VS:  BP 140/80 (BP Location: Left Arm, Patient Position: Sitting, Cuff Size: Normal)   Pulse 66   Ht 5\' 8"  (1.727 m)   Wt 199 lb (90.3 kg)   BMI 30.26 kg/m  , BMI Body mass index is 30.26 kg/m. GEN: Well nourished, well developed, in no acute distress.  HEENT: normal.  Neck: Supple, no JVD, carotid bruits, or masses. Cardiac: RRR, no murmurs, rubs, or gallops. No clubbing, cyanosis, edema.  Radials/DP/PT 2+ and equal bilaterally.  Respiratory:  Respirations regular and unlabored, clear to auscultation bilaterally. GI: Soft, nontender, nondistended, BS + x 4. MS: no deformity or atrophy. Skin: warm and dry, no rash. Neuro:  Strength and sensation are intact. Psych: Normal  affect.  Accessory Clinical Findings    ECG - Regular sinus rhythm, 66, left axis deviation, left bundle branch block, no acute ST or T changes.  Assessment & Plan    1.  Coronary artery disease: Status post PCI and drug-eluting stent placement to the LAD in April 2018. He has been doing well without any chest pain or dyspnea. He remains on aspirin, statin, beta blocker, Plavix, ARB, and long-acting nitrate therapy.  2. 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 He is euvolemic on exam today. He does not weigh himself at home and sometimes partakes in fast food. He continues to drink a few beers on the weekends.  We discussed the importance of daily weights, sodium restriction, medication compliance, and symptom reporting and he verbalizes understanding. He remains on beta blocker, Lasix, ARB, hydralazine, and nitrate therapy. His blood pressure is  elevated today and I am escalating his losartan dose to 100 mg daily. In that setting, I will follow-up a basic metabolic panel in 1 week. Plan to follow-up echo in July to reevaluate LV function and determine whether or not he is a candidate for ICD therapy.  3. Hypertensive heart disease: Titrating losartan to 100 mg daily. Follow-up basic metabolic panel in 1 week.  4. Hyperlipidemia: LDL 64 in April with normal LFTs at that time. Continue statin therapy.  5. Type 2 diabetes mellitus: Patient is using insulin and is followed by primary care.  6. Stage III chronic kidney disease: Now followed by nephrology. He was placed on ARB therapy. This will be titrated today with plan for follow-up basic metabolic panel in 1 week.  7. Disposition: Follow-up basic metabolic panel in one week. Follow-up echo in July. Follow-up with Dr. Fletcher Anon in approximately 2 months or sooner if necessary.   Murray Hodgkins, NP 12/18/2016, 4:18 PM

## 2016-12-18 NOTE — Patient Instructions (Addendum)
Medication Instructions:  Your physician has recommended you make the following change in your medication:  INCREASE losartan to 100mg  once daily   Labwork: BMET in one week at the [facility] outpatient lab. No appointment necessary.  Testing/Procedures: Your physician has requested that you have an echocardiogram. Echocardiography is a painless test that uses sound waves to create images of your heart. It provides your doctor with information about the size and shape of your heart and how well your heart's chambers and valves are working. This procedure takes approximately one hour. There are no restrictions for this procedure.    Follow-Up: Your physician recommends that you schedule a follow-up appointment in: August with Dr. Fletcher Anon.    Any Other Special Instructions Will Be Listed Below (If Applicable).     If you need a refill on your cardiac medications before your next appointment, please call your pharmacy.  Echocardiogram An echocardiogram, or echocardiography, uses sound waves (ultrasound) to produce an image of your heart. The echocardiogram is simple, painless, obtained within a short period of time, and offers valuable information to your health care provider. The images from an echocardiogram can provide information such as:  Evidence of coronary artery disease (CAD).  Heart size.  Heart muscle function.  Heart valve function.  Aneurysm detection.  Evidence of a past heart attack.  Fluid buildup around the heart.  Heart muscle thickening.  Assess heart valve function.  Tell a health care provider about:  Any allergies you have.  All medicines you are taking, including vitamins, herbs, eye drops, creams, and over-the-counter medicines.  Any problems you or family members have had with anesthetic medicines.  Any blood disorders you have.  Any surgeries you have had.  Any medical conditions you have.  Whether you are pregnant or may be  pregnant. What happens before the procedure? No special preparation is needed. Eat and drink normally. What happens during the procedure?  In order to produce an image of your heart, gel will be applied to your chest and a wand-like tool (transducer) will be moved over your chest. The gel will help transmit the sound waves from the transducer. The sound waves will harmlessly bounce off your heart to allow the heart images to be captured in real-time motion. These images will then be recorded.  You may need an IV to receive a medicine that improves the quality of the pictures. What happens after the procedure? You may return to your normal schedule including diet, activities, and medicines, unless your health care provider tells you otherwise. This information is not intended to replace advice given to you by your health care provider. Make sure you discuss any questions you have with your health care provider. Document Released: 06/28/2000 Document Revised: 02/17/2016 Document Reviewed: 03/08/2013 Elsevier Interactive Patient Education  2017 [employer].

## 2016-12-24 ENCOUNTER — Other Ambulatory Visit: Payer: Self-pay | Admitting: *Deleted

## 2016-12-24 ENCOUNTER — Other Ambulatory Visit
Admission: RE | Admit: 2016-12-24 | Discharge: 2016-12-24 | Disposition: A | Discharge status: Home / Self Care | Payer: Medicaid Other | Source: Ambulatory Visit | Attending: Nurse Practitioner | Admitting: Nurse Practitioner

## 2016-12-24 DIAGNOSIS — N183 Chronic kidney disease, stage 3 unspecified: Secondary | ICD-10-CM

## 2016-12-24 DIAGNOSIS — I5022 Chronic systolic (congestive) heart failure: Secondary | ICD-10-CM

## 2016-12-24 DIAGNOSIS — Z79899 Other long term (current) drug therapy: Secondary | ICD-10-CM

## 2016-12-24 LAB — BASIC METABOLIC PANEL
Anion gap: 7 (ref 5–15)
BUN: 52 mg/dL — AB (ref 6–20)
CALCIUM: 9.1 mg/dL (ref 8.9–10.3)
CO2: 25 mmol/L (ref 22–32)
CREATININE: 2.25 mg/dL — AB (ref 0.61–1.24)
Chloride: 102 mmol/L (ref 101–111)
GFR calc Af Amer: 35 mL/min — ABNORMAL LOW (ref >60)
GFR calc non Af Amer: 31 mL/min — ABNORMAL LOW (ref >60)
GLUCOSE: 192 mg/dL — AB (ref 65–99)
Potassium: 4.8 mmol/L (ref 3.5–5.1)
Sodium: 134 mmol/L — ABNORMAL LOW (ref 135–145)

## 2016-12-24 MED ORDER — FUROSEMIDE 40 MG PO TABS: 20.0000 mg | ORAL_TABLET | Freq: Every day | ORAL | 2 refills | Status: DC

## 2017-01-09 ENCOUNTER — Ambulatory Visit: Payer: Medicaid Other | Admitting: Family Medicine

## 2017-01-21 ENCOUNTER — Ambulatory Visit: Payer: Medicaid Other | Admitting: Family Medicine

## 2017-01-23 ENCOUNTER — Telehealth: Payer: Self-pay | Admitting: *Deleted

## 2017-01-23 NOTE — Telephone Encounter (Signed)
Patient did not get advised labs around 12/31/16 as advised from lab result on 12/24/16. Patient not available but spoke with wife who is listed on DPR. She said patient refused to get lab work as he was tired of being stuck for blood. Suggested I would be glad to speak with patient concerning importance of follow up lab work. Wife will have patient call me to discuss when she gets home.

## 2017-01-28 ENCOUNTER — Other Ambulatory Visit: Payer: Self-pay

## 2017-01-28 ENCOUNTER — Ambulatory Visit (INDEPENDENT_AMBULATORY_CARE_PROVIDER_SITE_OTHER): Payer: Medicaid Other

## 2017-01-28 DIAGNOSIS — I5022 Chronic systolic (congestive) heart failure: Secondary | ICD-10-CM | POA: Diagnosis not present

## 2017-01-29 NOTE — Telephone Encounter (Signed)
Patient not home. Wife answered listed on DPR. She verbalized understanding of results for echo and plan of care. Advised patient still needed repeat BMP at [facility]. She stated she had talked to him a few days ago about that. He had agreed at that time to get the labs. She said she would give him the results of echo and remind him about lab work. She is aware of upcoming appt date and time with Dr Fletcher Anon in August.

## 2017-02-10 ENCOUNTER — Ambulatory Visit (INDEPENDENT_AMBULATORY_CARE_PROVIDER_SITE_OTHER): Payer: Medicaid Other | Admitting: Family Medicine

## 2017-02-10 ENCOUNTER — Encounter: Payer: Self-pay | Admitting: Family Medicine

## 2017-02-10 VITALS — BP 142/84 | HR 68 | Temp 98.2°F | Resp 16 | Ht 68.0 in | Wt 210.0 lb

## 2017-02-10 DIAGNOSIS — N183 Chronic kidney disease, stage 3 unspecified: Secondary | ICD-10-CM

## 2017-02-10 DIAGNOSIS — I10 Essential (primary) hypertension: Secondary | ICD-10-CM | POA: Diagnosis not present

## 2017-02-10 DIAGNOSIS — E1169 Type 2 diabetes mellitus with other specified complication: Secondary | ICD-10-CM | POA: Diagnosis not present

## 2017-02-10 DIAGNOSIS — I5022 Chronic systolic (congestive) heart failure: Secondary | ICD-10-CM

## 2017-02-10 DIAGNOSIS — E1142 Type 2 diabetes mellitus with diabetic polyneuropathy: Secondary | ICD-10-CM | POA: Diagnosis not present

## 2017-02-10 DIAGNOSIS — E785 Hyperlipidemia, unspecified: Secondary | ICD-10-CM | POA: Diagnosis not present

## 2017-02-10 LAB — POCT GLYCOSYLATED HEMOGLOBIN (HGB A1C): Hemoglobin A1C: 9.1 — AB (ref ?–5.7)

## 2017-02-10 MED ORDER — METFORMIN HCL 1000 MG PO TABS: 1000.0000 mg | ORAL_TABLET | Freq: Two times a day (BID) | ORAL | 3 refills | Status: DC

## 2017-02-10 MED ORDER — ATORVASTATIN CALCIUM 40 MG PO TABS: 40.0000 mg | ORAL_TABLET | Freq: Every day | ORAL | 3 refills | Status: DC

## 2017-02-10 MED ORDER — HYDRALAZINE HCL 50 MG PO TABS: 50.0000 mg | ORAL_TABLET | Freq: Three times a day (TID) | ORAL | 3 refills | Status: DC

## 2017-02-10 MED ORDER — LANTUS SOLOSTAR 100 UNIT/ML ~~LOC~~ SOPN: 40.0000 [IU] | PEN_INJECTOR | Freq: Every day | SUBCUTANEOUS | 5 refills | Status: DC

## 2017-02-10 MED ORDER — CARVEDILOL 25 MG PO TABS: 25.0000 mg | ORAL_TABLET | Freq: Two times a day (BID) | ORAL | 3 refills | Status: DC

## 2017-02-10 MED ORDER — ISOSORBIDE MONONITRATE ER 60 MG PO TB24: 60.0000 mg | ORAL_TABLET | Freq: Every day | ORAL | 3 refills | Status: DC

## 2017-02-10 NOTE — Patient Instructions (Addendum)
Thank you for coming to the clinic today.  1. Try not to run out of medicines in future - call us in advance, we can send refills as needed, especially if my name is not on rx bottle, ask [place] for refill  2. Sent meds to [facility] today  3. 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  If you have any other questions or concerns, please feel free to call the clinic or send a message through [place]. You may also schedule an earlier appointment if necessary.  Additionally, you may be receiving a survey about your experience at our clinic within a few days to 1 week by e-mail or mail. We value your feedback.  [name], DO [place]

## 2017-02-10 NOTE — Assessment & Plan Note (Signed)
Controlled cholesterol on statin but poor lifestyle Last lipid panel 10/2016 Calculated ASCVD 10 yr risk score elevated with DM, CAD  Plan: 1. Continue current meds - Atorvastatin 40mg  daily (refilled) 2. Continue Plavix, ASA 81mg  for secondary ASCVD risk reduction 3. Encourage improved lifestyle - low carb/cholesterol, reduce portion size, continue improving regular exercise 4. Follow-up 3 months

## 2017-02-10 NOTE — Progress Notes (Signed)
Subjective:    Patient ID: [name], male    DOB: March 09, 1961, 56 y.o.   MRN: [id number]  [name] is a 56 y.o. male presenting on 02/10/2017 for Diabetes   HPI  Specialists Cardiology - Dr Fletcher Anon [facility]) Nephrology - Dr Holley Raring ([facility])  CHRONIC DM, Type 2 with DM Neuropathy He remains off Bydureon. He is unsure why sugar is worse, but then attributes some of it to lifestyle. He does not have detailed CBG log today, can recall no significant lows usually ranging 100-130s, which does not seem consistent. Meds: Lantus 40u daily, Metformin 1000mg  BID (has some diarrhea, but still taking) Reports good compliance. Tolerating well w/o side-effects Currently on ACEi / ARB Lifestyle: - Diet (had improved DM diet, but now not adhering as well)  - Exercise (Limited regular exericse) - History of DM retinopathy Denies hypoglycemia, polyuria, visual changes, numbness or tingling.  CHRONIC HTN: Reports some of his meds ran out after hospitalization in 10/2016, since they were prescribed by hospitalist, and he did not request refills, was going to wait until today. Current Meds - Carvedilol 25mg  BID (currently out), Furosemide 20mg  daily (half tab of 40mg ), Hydralazine 50mg  q 8 hr (out due for refill), isosorbide mononitrate imdur 60mg  (has med), Losartan 100mg  (due for refill, added back 50mg  by [facility] in 11/2016 then [facility] back to 100mg  in June 2018) Took some of these meds today. Tolerating well, w/o complaints. Denies CP, dyspnea, HA, edema, dizziness / lightheadedness  HYPERLIPIDEMIA: - Reports no concerns. Last lipid panel 10/2016, controlled LDL except elevated TG - Currently taking Atorvastatin 40mg  (currently OUT need refill), tolerating well without side effects or myalgias - Also for CAD - taking Plavix, ASA 81  PMH: CAD s/p cardiac cath and PCI with 90% LAD stenosis / Dilated Cardiomyopathy CHF Exacerbation (Systolic and Diastolic) History of  Alcohol Abuse CKD-III-IV  Social History  Substance Use Topics  . Smoking status: Passive Smoke Exposure - Never Smoker  . Smokeless tobacco: Never Used  . Alcohol use Yes    Review of Systems   Per HPI unless specifically indicated above     Objective:    BP (!) 142/84 (BP Location: Left Arm, Cuff Size: Normal)   Pulse 68   Temp 98.2 F (36.8 C) (Oral)   Resp 16   Ht 5\' 8"  (1.727 m)   Wt 210 lb (95.3 kg)   BMI 31.93 kg/m   Wt Readings from Last 3 Encounters:  02/10/17 210 lb (95.3 kg)  12/18/16 199 lb (90.3 kg)  11/06/16 203 lb 1.6 oz (92.1 kg)    Physical Exam  Constitutional: He is oriented to person, place, and time. He appears well-developed and well-nourished. No distress.  Well-appearing, comfortable, cooperative  HENT:  Head: Normocephalic and atraumatic.  Mouth/Throat: Oropharynx is clear and moist.  Eyes: Conjunctivae are normal.  Neck: Normal range of motion. Neck supple. No thyromegaly present.  Cardiovascular: Normal rate, regular rhythm, normal heart sounds and intact distal pulses.   No murmur heard. Pulmonary/Chest: Effort normal and breath sounds normal. No respiratory distress. He has no wheezes. He has no rales.  Musculoskeletal: He exhibits no edema (Resolved, now only trace non pitting edema).  Lymphadenopathy:    He has no cervical adenopathy.  Neurological: He is alert and oriented to person, place, and time.  Skin: Skin is warm and dry. No rash noted. He is not diaphoretic. No erythema.  Psychiatric: He has a normal mood and affect.  His behavior is normal.  Well groomed, good eye contact, normal speech and thoughts  Nursing note and vitals reviewed.  Results for orders placed or performed in visit on 02/10/17  [facility] HIV SCREENING LAB  Result Value Ref Range   HM HIV Screening Negative - Validated   HM HEPATITIS C SCREENING LAB  Result Value Ref Range   [facility] Hepatitis Screen Negative-Validated   POCT HgB A1C  Result Value Ref Range    Hemoglobin A1C 9.1 (A) 5.7      Assessment & Plan:   Problem List Items Addressed This Visit    Resistant hypertension    Mildly elevated initial BP, repeat manual check still elevated with non adherence of some meds, ran out did not request refill.  Complication with CKD-III, CAD   Plan:  1. Continue current BP regimen - Carvedilol 25mg  BID (refilled), Furosemide 20mg  daily (half tab of 40mg ), Hydralazine 50mg  q 8 hr (refilled), isosorbide mononitrate imdur 60mg , Losartan 100mg  (refilled) 2. Encourage improved lifestyle - low sodium diet, regular exercise 3. Start monitor BP outside office, bring readings to next visit, if persistently >140/90 or new symptoms notify office sooner 4. Follow-up 3 months      Relevant Medications   atorvastatin (LIPITOR) 40 MG tablet   carvedilol (COREG) 25 MG tablet   hydrALAZINE (APRESOLINE) 50 MG tablet   isosorbide mononitrate (IMDUR) 60 MG 24 hr tablet   Hyperlipidemia associated with type 2 diabetes mellitus (HCC)    Controlled cholesterol on statin but poor lifestyle Last lipid panel 10/2016 Calculated ASCVD 10 yr risk score elevated with DM, CAD  Plan: 1. Continue current meds - Atorvastatin 40mg  daily (refilled) 2. Continue Plavix, ASA 81mg  for secondary ASCVD risk reduction 3. Encourage improved lifestyle - low carb/cholesterol, reduce portion size, continue improving regular exercise 4. Follow-up 3 months      Relevant Medications   atorvastatin (LIPITOR) 40 MG tablet   carvedilol (COREG) 25 MG tablet   hydrALAZINE (APRESOLINE) 50 MG tablet   isosorbide mononitrate (IMDUR) 60 MG 24 hr tablet   LANTUS SOLOSTAR 100 UNIT/ML Solostar Pen   metFORMIN (GLUCOPHAGE) 1000 MG tablet   DM type 2 with diabetic peripheral neuropathy (HCC) - Primary    Uncontrolled DM with A1c back to 9.0 (worsening control from 6.8, had been 9.3 recently though), attributed to change again of poor lifestyle and then self discontinued Bydureon  recently Complications - CKD-III, peripheral neuropathy, DM retinopathy  Plan:  1. Continue current therapy - Lantus 40u daily - titration based on CBGs, Metformin 1000mg  BID - reviewed may need add back alternative GLP1 patient concerned about cost/side effects, limited options due to CKD 2. Encourage improved lifestyle - low carb, low sugar diet, reduce portion size, continue improving regular exercise 3. Check CBG, bring log to next visit for review 4. Continue ASA, ARB, Statin 5. Advised to schedule DM ophtho exam, send record 6. Follow-up 3 months      Relevant Medications   atorvastatin (LIPITOR) 40 MG tablet   LANTUS SOLOSTAR 100 UNIT/ML Solostar Pen   metFORMIN (GLUCOPHAGE) 1000 MG tablet   Other Relevant Orders   POCT HgB A1C (Completed)   Chronic systolic heart failure (HCC)    Stable CHF with history of reduced EF Continues diuresis on Lasix PO 20mg  daily Clinically euvolemic today Follow-up with Cardiology as planned      Relevant Medications   atorvastatin (LIPITOR) 40 MG tablet   carvedilol (COREG) 25 MG tablet   hydrALAZINE (APRESOLINE) 50 MG  tablet   isosorbide mononitrate (IMDUR) 60 MG 24 hr tablet   Chronic kidney disease (CKD), stage III (moderate)    Dramatic worsening elevated Cr over past several months, with CKD-III to IV progression in setting of CHF, DM Followed by Nephrology Concern with inadequate control HTN and DM        Follow up plan: Return in about 3 months (around 05/13/2017) for diabetes, blood pressure.    [name], [facility] 02/10/2017, 10:05 PM

## 2017-02-10 NOTE — Assessment & Plan Note (Signed)
Stable CHF with history of reduced EF Continues diuresis on Lasix PO 20mg  daily Clinically euvolemic today Follow-up with Cardiology as planned

## 2017-02-10 NOTE — Assessment & Plan Note (Signed)
Uncontrolled DM with A1c back to 9.0 (worsening control from 6.8, had been 9.3 recently though), attributed to change again of poor lifestyle and then self discontinued Bydureon recently Complications - CKD-III, peripheral neuropathy, DM retinopathy  Plan:  1. Continue current therapy - Lantus 40u daily - titration based on CBGs, Metformin 1000mg  BID - reviewed may need add back alternative GLP1 patient concerned about cost/side effects, limited options due to CKD 2. Encourage improved lifestyle - low carb, low sugar diet, reduce portion size, continue improving regular exercise 3. Check CBG, bring log to next visit for review 4. Continue ASA, ARB, Statin 5. Advised to schedule DM ophtho exam, send record 6. Follow-up 3 months

## 2017-02-10 NOTE — Assessment & Plan Note (Signed)
Dramatic worsening elevated Cr over past several months, with CKD-III to IV progression in setting of CHF, DM Followed by Nephrology Concern with inadequate control HTN and DM

## 2017-02-10 NOTE — Assessment & Plan Note (Signed)
Mildly elevated initial BP, repeat manual check still elevated with non adherence of some meds, ran out did not request refill.  Complication with CKD-III, CAD   Plan:  1. Continue current BP regimen - Carvedilol 25mg  BID (refilled), Furosemide 20mg  daily (half tab of 40mg ), Hydralazine 50mg  q 8 hr (refilled), isosorbide mononitrate imdur 60mg , Losartan 100mg  (refilled) 2. Encourage improved lifestyle - low sodium diet, regular exercise 3. Start monitor BP outside office, bring readings to next visit, if persistently >140/90 or new symptoms notify office sooner 4. Follow-up 3 months

## 2017-02-25 ENCOUNTER — Ambulatory Visit (INDEPENDENT_AMBULATORY_CARE_PROVIDER_SITE_OTHER): Payer: Medicaid Other | Admitting: Cardiovascular Disease

## 2017-02-25 ENCOUNTER — Encounter: Payer: Self-pay | Admitting: Cardiovascular Disease

## 2017-02-25 VITALS — BP 150/72 | HR 64 | Ht 68.0 in | Wt 211.0 lb

## 2017-02-25 DIAGNOSIS — I10 Essential (primary) hypertension: Secondary | ICD-10-CM

## 2017-02-25 DIAGNOSIS — I5022 Chronic systolic (congestive) heart failure: Secondary | ICD-10-CM

## 2017-02-25 DIAGNOSIS — I2510 Atherosclerotic heart disease of native coronary artery without angina pectoris: Secondary | ICD-10-CM

## 2017-02-25 DIAGNOSIS — E785 Hyperlipidemia, unspecified: Secondary | ICD-10-CM | POA: Diagnosis not present

## 2017-02-25 NOTE — Progress Notes (Signed)
Cardiology Office Note   Date:  02/25/2017   ID:  [name], DOB Jul 13, 1961, MRN [id number]  PCP:  Harold Hauser, DO  Cardiologist:   Harold Sacramento, MD   Chief Complaint  Patient presents with  . other    Follow up Echo & 2 month. Meds reviewed by the pt's bottles. "doing well."       History of Present Illness: [name] is a 56 y.o. male who presents for a follow-up visit regarding chronic systolic heart failure and coronary artery disease. He has known history of refractory hypertension, stage III chronic kidney disease, diabetes mellitus with diabetic neuropathy and excessive alcohol use. He presented In April of this year with progressive shortness of breath and leg edema. He was significantly hypertensive on presentation. EKG showed left bundle branch block of unknown duration. Troponin was borderline elevated. Echocardiogram showed severely reduced LV systolic function with an EF of 25-30% with mild mitral regurgitation and mildly dilated left atrium. Cardiac catheterization showed severe one-vessel coronary artery disease involving the mid LAD with mildly elevated left ventricular end-diastolic pressure. I performed successful angioplasty and drug-eluting stent placement to the mid LAD without complications.  Echocardiogram was repeated last month which showed improvement in ejection fraction to 40-45% with mildly dilated left atrium. He has been doing well overall with no chest pain or shortness of breath. Blood pressure continues to be not controlled. Renal function worsened and the dose of furosemide was decreased to 20 mg once daily.     Past Medical History:  Diagnosis Date  . Ankle pain   . Chronic systolic heart failure ([name]) 10/2016   a. 10/2016 Echo: EF 25-30%, diff HK, mild MR.  . CKD (chronic kidney disease), stage III   . Coronary artery disease    a. 10/2016 Cath/PCI: LAD 69m (3.5x15 Xience Alpine DES). No other obstructive  disease.  . Diabetes mellitus with complication ([place])   . Diabetic neuropathy ([name])   . Hyperlipidemia   . Hypertension   . 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 10/2016 Echo: EF 25-30%, diff HK.  . Pain in both feet     Past Surgical History:  Procedure Laterality Date  . CORONARY STENT INTERVENTION N/A 10/21/2016   Procedure: Coronary Stent Intervention;  Surgeon: Harold Hampshire, MD;  Location: [place] [facility];  Service: Cardiovascular;  Laterality: N/A;  . LEFT HEART CATH AND CORONARY ANGIOGRAPHY N/A 10/21/2016   Procedure: Left Heart Cath and Coronary Angiography;  Surgeon: Harold Hampshire, MD;  Location: [place] [facility];  Service: Cardiovascular;  Laterality: N/A;  . TIBIA FRACTURE SURGERY Right 2002     Current Outpatient Prescriptions  Medication Sig Dispense Refill  . acetaminophen (TYLENOL 8 HOUR) 650 MG CR tablet Take 1 tablet (650 mg total) by mouth every 8 (eight) hours as needed for pain.    . Alpha-Lipoic Acid 600 MG CAPS Take 1 capsule by mouth daily.    [name] aspirin EC 81 MG tablet Take 81 mg by mouth.    [name] atorvastatin (LIPITOR) 40 MG tablet Take 1 tablet (40 mg total) by mouth daily at 6 PM. 90 tablet 3  . Blood Pressure Monitoring (BLOOD PRESSURE CUFF) MISC 1 each by Does not apply route daily. 1 each 0  . carvedilol (COREG) 25 MG tablet Take 1 tablet (25 mg total) by mouth 2 (two) times daily with a meal. 180 tablet 3  . clopidogrel (PLAVIX) 75  MG tablet Take 1 tablet (75 mg total) by mouth daily. 30 tablet 10  . furosemide (LASIX) 40 MG tablet Take 0.5 tablets (20 mg total) by mouth daily. 30 tablet 2  . gabapentin (NEURONTIN) 400 MG capsule Take 1 capsule (400 mg total) by mouth 3 (three) times daily. 90 capsule 0  . glucose blood test strip Use as instructed 100 each 12  . hydrALAZINE (APRESOLINE) 50 MG tablet Take 1 tablet (50 mg total) by mouth every 8 (eight) hours. 270 tablet 3  . Insulin Pen Needle (PEN NEEDLES 31GX5/16") 31G  X 8 MM MISC Check blood sugar up to 3 times daily as advised. 100 each 11  . isosorbide mononitrate (IMDUR) 60 MG 24 hr tablet Take 1 tablet (60 mg total) by mouth daily. 90 tablet 3  . LANTUS SOLOSTAR 100 UNIT/ML Solostar Pen Inject 40 Units into the skin at bedtime. 100 mL 5  . losartan (COZAAR) 100 MG tablet Take 1 tablet (100 mg total) by mouth daily. 90 tablet 3  . metFORMIN (GLUCOPHAGE) 1000 MG tablet Take 1 tablet (1,000 mg total) by mouth 2 (two) times daily with a meal. 180 tablet 3   No current facility-administered medications for this visit.     Allergies:   Lisinopril and Terbinafine and related    Social History:  The patient  reports that he is a non-smoker but has been exposed to tobacco smoke. He has never used smokeless tobacco. He reports that he drinks alcohol. He reports that he does not use drugs.   Family History:  The patient's family history includes Heart disease in his maternal grandfather; Hypertension in his mother.    ROS:  Please see the history of present illness.   Otherwise, review of systems are positive for none.   All other systems are reviewed and negative.    PHYSICAL EXAM: VS:  BP (!) 150/72 (BP Location: Left Arm, Patient Position: Sitting, Cuff Size: Normal)   Pulse 64   Ht 5\' 8"  (1.727 m)   Wt 211 lb (95.7 kg)   BMI 32.08 kg/m  , BMI Body mass index is 32.08 kg/m. GEN: Well nourished, well developed, in no acute distress  HEENT: normal  Neck: no JVD, carotid bruits, or masses Cardiac: RRR; no murmurs, rubs, or gallops,no edema  Respiratory:  clear to auscultation bilaterally, normal work of breathing GI: soft, nontender, nondistended, + BS MS: no deformity or atrophy  Skin: warm and dry, no rash Neuro:  Strength and sensation are intact Psych: euthymic mood, full affect Right radial pulse is normal with no hematoma  EKG:  EKG is not ordered today.    Recent Labs: 10/18/2016: ALT 27; B Natriuretic Peptide 541.0 10/19/2016: Hemoglobin  13.3; Magnesium 1.5; Platelets 188 12/24/2016: BUN 52; Creatinine, Ser 2.25; Potassium 4.8; Sodium 134    Lipid Panel    Component Value Date/Time   CHOL 139 10/20/2016 0515   CHOL 219 (H) 08/28/2015 1135   TRIG 171 (H) 10/20/2016 0515   HDL 41 10/20/2016 0515   HDL 52 08/28/2015 1135   CHOLHDL 3.4 10/20/2016 0515   VLDL 34 10/20/2016 0515   LDLCALC 64 10/20/2016 0515   LDLCALC 107 (H) 08/28/2015 1135      Wt Readings from Last 3 Encounters:  02/25/17 211 lb (95.7 kg)  02/10/17 210 lb (95.3 kg)  12/18/16 199 lb (90.3 kg)       PAD Screen 10/28/2016  Previous PAD dx? No  Previous surgical procedure? No  Pain  with walking? No  Feet/toe relief with dangling? No  Painful, non-healing ulcers? No  Extremities discolored? No      ASSESSMENT AND PLAN:  1.  Coronary artery disease involving native coronary arteries without angina:  He is doing very well with no anginal symptoms. Continue medical therapy.  2. Chronic systolic heart failure: He appears to be euvolemic on current dose of furosemide 20 mg once daily.  Continue treatment with carvedilol and losartan. Most recent ejection fraction was 40-45%. I requested basic metabolic profile to follow up on recent worsening of renal function  3. Essential hypertension: Blood pressure continues to be elevated in spite of for blood pressure medications. Thus, I requested a renal artery duplex to evaluate for possible renal artery stenosis.  4. Hyperlipidemia: Continue treatment with atorvastatin. Most recent LDL was 64.   Disposition:   FU with me in 4 months  Signed,  Harold Sacramento, MD  02/25/2017 3:10 PM    [place]

## 2017-02-25 NOTE — Patient Instructions (Signed)
Medication Instructions:  Your physician recommends that you continue on your current medications as directed. Please refer to the Current Medication list given to you today.   Labwork: BMET   Testing/Procedures: Your physician has requested that you have a renal artery duplex. During this test, an ultrasound is used to evaluate blood flow to the kidneys. Allow one hour for this exam. Do not eat after midnight the day before and avoid carbonated beverages. Take your medications as you usually do.    Follow-Up: Your physician recommends that you schedule a follow-up appointment in: 4 months with Dr. Fletcher Anon.    Any Other Special Instructions Will Be Listed Below (If Applicable).     If you need a refill on your cardiac medications before your next appointment, please call your pharmacy.

## 2017-02-26 ENCOUNTER — Other Ambulatory Visit: Payer: Self-pay | Admitting: Cardiovascular Disease

## 2017-02-26 DIAGNOSIS — I159 Secondary hypertension, unspecified: Secondary | ICD-10-CM

## 2017-02-26 LAB — BASIC METABOLIC PANEL
BUN/Creatinine Ratio: 25 — ABNORMAL HIGH (ref 9–20)
BUN: 40 mg/dL — ABNORMAL HIGH (ref 6–24)
CALCIUM: 9.4 mg/dL (ref 8.7–10.2)
CO2: 20 mmol/L (ref 20–29)
CREATININE: 1.62 mg/dL — AB (ref 0.76–1.27)
Chloride: 103 mmol/L (ref 96–106)
GFR calc Af Amer: 54 mL/min/{1.73_m2} — ABNORMAL LOW (ref >59)
GFR, EST NON AFRICAN AMERICAN: 46 mL/min/{1.73_m2} — AB (ref >59)
Glucose: 133 mg/dL — ABNORMAL HIGH (ref 65–99)
Potassium: 4.5 mmol/L (ref 3.5–5.2)
Sodium: 136 mmol/L (ref 134–144)

## 2017-03-03 ENCOUNTER — Telehealth: Payer: Self-pay | Admitting: Cardiovascular Disease

## 2017-03-03 NOTE — Telephone Encounter (Signed)
Please call to discuss lab results 

## 2017-03-03 NOTE — Telephone Encounter (Signed)
No answer.  Left message on machine for patient to contact the office.  See results note.

## 2017-03-06 ENCOUNTER — Ambulatory Visit: Payer: Medicaid Other

## 2017-03-06 DIAGNOSIS — I701 Atherosclerosis of renal artery: Secondary | ICD-10-CM | POA: Diagnosis not present

## 2017-03-06 DIAGNOSIS — I159 Secondary hypertension, unspecified: Secondary | ICD-10-CM

## 2017-03-18 NOTE — Progress Notes (Signed)
Letter mailed

## 2017-03-19 ENCOUNTER — Telehealth: Payer: Self-pay | Admitting: Cardiovascular Disease

## 2017-03-19 MED ORDER — FUROSEMIDE 40 MG PO TABS: 20.0000 mg | ORAL_TABLET | Freq: Every day | ORAL | 3 refills | Status: DC

## 2017-03-19 NOTE — Telephone Encounter (Signed)
°*  STAT* If patient is at the pharmacy, call can be transferred to refill team.   1. Which medications need to be refilled? (please list name of each medication and dose if known) Furosamide   2. Which pharmacy/location (including street and city if local pharmacy) is [facility] to [facility] sent to?[facility]   3. Do they need a 30 day or 90 day supply? 90 day

## 2017-03-20 ENCOUNTER — Telehealth: Payer: Self-pay | Admitting: Cardiovascular Disease

## 2017-03-20 NOTE — Telephone Encounter (Signed)
Spoke patient and wife. Lab results from 02/25/17 and recent renal doppler results given to patient and he verbalized understanding. Patient also needed refill for furosemide.  Furosemide was refill on 03/19/17 to patient's pharmacy. She verbalized understanding and will go get Rx for patient.

## 2017-03-20 NOTE — Telephone Encounter (Signed)
Pt would like lab results.  

## 2017-05-15 ENCOUNTER — Encounter: Payer: Self-pay | Admitting: Family Medicine

## 2017-05-15 ENCOUNTER — Ambulatory Visit (INDEPENDENT_AMBULATORY_CARE_PROVIDER_SITE_OTHER): Payer: Medicaid Other | Admitting: Family Medicine

## 2017-05-15 VITALS — BP 152/80 | HR 72 | Temp 97.6°F | Resp 16 | Ht 68.0 in | Wt 211.8 lb

## 2017-05-15 DIAGNOSIS — N183 Chronic kidney disease, stage 3 unspecified: Secondary | ICD-10-CM

## 2017-05-15 DIAGNOSIS — I10 Essential (primary) hypertension: Secondary | ICD-10-CM

## 2017-05-15 DIAGNOSIS — E1142 Type 2 diabetes mellitus with diabetic polyneuropathy: Secondary | ICD-10-CM

## 2017-05-15 LAB — POCT UA - MICROALBUMIN
ALBUMIN/CREATININE RATIO, URINE, POC: 0
Creatinine, POC: 0 mg/dL
Microalbumin Ur, POC: 20 mg/L

## 2017-05-15 LAB — POCT GLYCOSYLATED HEMOGLOBIN (HGB A1C): Hemoglobin A1C: 8.6 — AB (ref ?–5.6)

## 2017-05-15 MED ORDER — CARVEDILOL 25 MG PO TABS: 25.0000 mg | ORAL_TABLET | Freq: Two times a day (BID) | ORAL | 3 refills | Status: DC

## 2017-05-15 MED ORDER — EXENATIDE ER 2 MG/0.85ML ~~LOC~~ AUIJ: 2.0000 mg | AUTO-INJECTOR | SUBCUTANEOUS | 5 refills | Status: DC

## 2017-05-15 NOTE — Assessment & Plan Note (Signed)
Mildly elevated initial BP, repeat manual check still elevated again with ran out of med Carvedilol Complication with CKD-III, CAD   Plan:  1. Continue current BP regimen - Carvedilol 25mg  BID (refilled - checked pharmacy unsure why he could not get 90 day supply it is currently being filled), Furosemide 20mg  daily (half tab of 40mg ), Hydralazine 50mg  q 8 hr, isosorbide mononitrate imdur 60mg , Losartan 100mg  2. Encourage improved lifestyle - low sodium diet, regular exercise 3. Continue monitor BP outside office, bring readings to next visit, if persistently >140/90 or new symptoms notify office sooner 4. Follow-up 3 months

## 2017-05-15 NOTE — Patient Instructions (Addendum)
Thank you for coming to the clinic today.  1. Try not to run out of medicines in future - call us in advance, we can send refills as needed, especially if my name is not on rx bottle, ask [place] for refill  2. [facility] - Carvedilol - RESTART BYdureon once weekly injection  3. 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  If you have any other questions or concerns, please feel free to call the clinic or send a message through [place]. You may also schedule an earlier appointment if necessary.  Additionally, you may be receiving a survey about your experience at our clinic within a few days to 1 week by e-mail or mail. We value your feedback.  [name], DO [place]

## 2017-05-15 NOTE — Progress Notes (Signed)
Subjective:    Patient ID: [name], male    DOB: 07-11-1961, 56 y.o.   MRN: [id number]  [name] is a 56 y.o. male presenting on 05/15/2017 for Diabetes   HPI   Specialists Cardiology - Dr Fletcher Anon [facility]) Nephrology - Dr Holley Raring ([facility])  CHRONIC DM, Type 2 with DM Neuropathy / CKD-III - Last visit with me 02/10/17, treated with adjusted Lantus, continue Metformin he was off Bydureon BCise GLP1 due to "stopped taking", see prior notes for background information. - Interval update with he self discontinued Metformin 1000mg  BID due to GI intolerance, and has only been on Lantus 40u daily - Today patient reports no new concerns. CBG: Avg 140, Low 120, High 180. Checks fasting AM most days Meds: Lantus 40u daily - he states cost or nausea was not reason for stopping Bydureon before, would be open to trying again Reports good compliance. Tolerating well w/o side-effects Currently on ARB Lifestyle: - Diet (Trying to improve DM diet, reduce portions) - Exercise (Limited regular exericse) - History of DM retinopathy - Last seen by [facility] in 2017 with DM retinopathy, he will call to schedule - Admits intermittent burning, tingling at times in feet, worse at night Denies hypoglycemia, polyuria, visual changes.  CHRONIC HTN: - Last visit with me 02/10/17, treated with continued same regimen with refills added as he ran out of meds did not call office, see prior notes for background information. - Interval update with again ran out of Carvedilol, he was given 90 day supply on 02/10/17 but states he was only given 30 pills, did not notify office - Today patient reports request refills on Furosemide, Carvedilol and Isosorbide Current Meds - Carvedilol 25mg  BID (currently out), Furosemide 20mg  daily (half tab of 40mg ), Hydralazine 50mg  q 8 hr, isosorbide mononitrate imdur 60mg  (has med), Losartan 100mg  Took some of these meds today. Tolerating well, w/o  complaints. Denies CP, dyspnea, HA, edema, dizziness / lightheadedness  Health Maintenance: - Due for Flu Shot, declines today despite counseling on benefits - Due for initial pneumonia vaccine < age 94 - declines pneumovax 23  Depression screen PHQ 2/9 05/15/2017 01/22/2016 08/28/2015  Decreased Interest 0 0 0  Down, Depressed, Hopeless 0 0 0  PHQ - 2 Score 0 0 0    Social History  Substance Use Topics  . Smoking status: Passive Smoke Exposure - Never Smoker  . Smokeless tobacco: Never Used  . Alcohol use Yes    Review of Systems Per HPI unless specifically indicated above     Objective:    BP (!) 152/80 (BP Location: Left Arm, Cuff Size: Normal)   Pulse 72   Temp 97.6 F (36.4 C) (Oral)   Resp 16   Ht 5\' 8"  (1.727 m)   Wt 211 lb 12.8 oz (96.1 kg)   BMI 32.20 kg/m   Wt Readings from Last 3 Encounters:  05/15/17 211 lb 12.8 oz (96.1 kg)  02/25/17 211 lb (95.7 kg)  02/10/17 210 lb (95.3 kg)    Physical Exam  Constitutional: He is oriented to person, place, and time. He appears well-developed and well-nourished. No distress.  Well-appearing, comfortable, cooperative  HENT:  Head: Normocephalic and atraumatic.  Mouth/Throat: Oropharynx is clear and moist.  Eyes: Conjunctivae are normal. Right eye exhibits no discharge. Left eye exhibits no discharge.  Neck: Normal range of motion. Neck supple. No thyromegaly present.  Cardiovascular: Normal rate, regular rhythm, normal heart sounds and intact distal pulses.  No murmur heard. Pulmonary/Chest: Effort normal and breath sounds normal. No respiratory distress. He has no wheezes. He has no rales.  Musculoskeletal: Normal range of motion. He exhibits edema (Trace to +1 bilateral ankle non pitting edema).  Lymphadenopathy:    He has no cervical adenopathy.  Neurological: He is alert and oriented to person, place, and time.  Skin: Skin is warm and dry. No rash noted. He is not diaphoretic. No erythema.  Slight thickening  bilateral great toenails  Psychiatric: He has a normal mood and affect. His behavior is normal.  Well groomed, good eye contact, normal speech and thoughts  Nursing note and vitals reviewed.    Diabetic Foot Exam - Simple   Simple Foot Form Diabetic Foot exam was performed with the following findings:  Yes 05/15/2017  8:34 AM  Visual Inspection No deformities, no ulcerations, no other skin breakdown bilaterally:  Yes Sensation Testing See comments:  Yes Pulse Check Posterior Tibialis and Dorsalis pulse intact bilaterally:  Yes Comments Right great toe reduced sensation to monofilament testing.      Recent Labs  10/19/16 0411 02/10/17 0927 05/15/17 0835  HGBA1C 6.8* 9.1* 8.6*    Results for orders placed or performed in visit on 05/15/17  POCT HgB A1C  Result Value Ref Range   Hemoglobin A1C 8.6 (A) 5.6  POCT UA - Microalbumin  Result Value Ref Range   Microalbumin Ur, POC 20 mg/L   Creatinine, POC 0 mg/dL   Albumin/Creatinine Ratio, Urine, POC 0       Assessment & Plan:   Problem List Items Addressed This Visit    Chronic kidney disease (CKD), stage III (moderate) (HCC)    Stable more recent Cr with improvement, CKD-III Followed by Nephrology [facility] Back on ARB Check urine microalbumin POC today - 20, improved from prior, will not check routinely since on ARB Agree with hold metformin now, due to concern with CKD, also with GI intolerance Resume GLP1 for DM control      DM type 2 with diabetic peripheral neuropathy (HCC) - Primary    Suboptimal control DM with improved A1c down to 8.6 (from 9.1) Complications - CKD-III, peripheral neuropathy, DM retinopathy  Plan:  1. - REMAIN OFF Metformin - GI intolerance and concern with CKD and elevated Cr - NEW RX RESTART - Bydureon BCise 2mg  weekly inj [place] - counsel again on weekly dose, take with food, caution nausea ([facility]ise and it is covered by medicaid, cost $3 copay) - Continue  current therapy - Lantus 40u daily - titration based on CBGs 2. Encourage improved lifestyle - low carb, low sugar diet, reduce portion size, continue improving regular exercise 3. Check CBG, bring log to next visit for review 4. Continue ASA, ARB, Statin - Check urine microalbumin today to eval CKD kidney function, already on ARB, result = 20, improved from prior 5. Again advised to schedule DM ophtho exam, send record 6. Follow-up 3 months DM A1c      Relevant Medications   Exenatide ER (BYDUREON BCISE) 2 MG/0.85ML AUIJ   Other Relevant Orders   POCT HgB A1C (Completed)   POCT UA - Microalbumin (Completed)   Resistant hypertension    Mildly elevated initial BP, repeat manual check still elevated again with ran out of med Carvedilol Complication with CKD-III, CAD   Plan:  1. Continue current BP regimen - Carvedilol 25mg  BID (refilled - checked pharmacy unsure why he could not get 90 day supply  it is currently being filled), Furosemide 20mg  daily (half tab of 40mg ), Hydralazine 50mg  q 8 hr, isosorbide mononitrate imdur 60mg , Losartan 100mg  2. Encourage improved lifestyle - low sodium diet, regular exercise 3. Continue monitor BP outside office, bring readings to next visit, if persistently >140/90 or new symptoms notify office sooner 4. Follow-up 3 months      Relevant Medications   amLODipine (NORVASC) 10 MG tablet   carvedilol (COREG) 25 MG tablet      Meds ordered this encounter  Medications  . amLODipine (NORVASC) 10 MG tablet    Sig: TAKE 1 TABLET (10 MG TOTAL) BY MOUTH DAILY.    Refill:  3  . cholecalciferol (VITAMIN D) 1000 units tablet    Sig: Take 1,000 Units by mouth daily.  . carvedilol (COREG) 25 MG tablet    Sig: Take 1 tablet (25 mg total) by mouth 2 (two) times daily with a meal.    Dispense:  180 tablet    Refill:  3  . Exenatide ER (BYDUREON BCISE) 2 MG/0.85ML AUIJ    Sig: Inject 2 mg into the skin once a week.    Dispense:  4 pen    Refill:  5     Follow up plan: Return in about 3 months (around 08/15/2017) for DM A1c, HTN.  [name], [facility] 05/15/2017, 9:01 AM

## 2017-05-15 NOTE — Assessment & Plan Note (Signed)
Stable more recent Cr with improvement, CKD-III Followed by Nephrology [facility] Back on ARB Check urine microalbumin POC today - 20, improved from prior, will not check routinely since on ARB Agree with hold metformin now, due to concern with CKD, also with GI intolerance Resume GLP1 for DM control

## 2017-05-15 NOTE — Assessment & Plan Note (Addendum)
Suboptimal control DM with improved A1c down to 8.6 (from 9.1) Complications - CKD-III, peripheral neuropathy, DM retinopathy  Plan:  1. - REMAIN OFF Metformin - GI intolerance and concern with CKD and elevated Cr - NEW RX RESTART - Bydureon BCise 2mg  weekly inj [place] - counsel again on weekly dose, take with food, caution nausea ([facility] as BCise and it is covered by medicaid, cost $3 copay) - Continue current therapy - Lantus 40u daily - titration based on CBGs 2. Encourage improved lifestyle - low carb, low sugar diet, reduce portion size, continue improving regular exercise 3. Check CBG, bring log to next visit for review 4. Continue ASA, ARB, Statin - Check urine microalbumin today to eval CKD kidney function, already on ARB, result = 20, improved from prior 5. Again advised to schedule DM ophtho exam, send record 6. Follow-up 3 months DM A1c

## 2017-07-04 ENCOUNTER — Ambulatory Visit: Payer: Medicaid Other | Admitting: Cardiovascular Disease

## 2017-07-04 ENCOUNTER — Encounter: Payer: Self-pay | Admitting: Cardiovascular Disease

## 2017-07-04 VITALS — BP 140/82 | HR 72 | Ht 69.0 in | Wt 217.5 lb

## 2017-07-04 DIAGNOSIS — I2510 Atherosclerotic heart disease of native coronary artery without angina pectoris: Secondary | ICD-10-CM | POA: Diagnosis not present

## 2017-07-04 DIAGNOSIS — I10 Essential (primary) hypertension: Secondary | ICD-10-CM | POA: Diagnosis not present

## 2017-07-04 DIAGNOSIS — E785 Hyperlipidemia, unspecified: Secondary | ICD-10-CM | POA: Diagnosis not present

## 2017-07-04 DIAGNOSIS — I5022 Chronic systolic (congestive) heart failure: Secondary | ICD-10-CM | POA: Diagnosis not present

## 2017-07-04 MED ORDER — LOSARTAN POTASSIUM 100 MG PO TABS: 100.0000 mg | ORAL_TABLET | Freq: Every day | ORAL | 3 refills | Status: DC

## 2017-07-04 MED ORDER — FUROSEMIDE 40 MG PO TABS: 40.0000 mg | ORAL_TABLET | Freq: Every day | ORAL | 3 refills | Status: DC

## 2017-07-04 NOTE — Patient Instructions (Signed)
Medication Instructions:  Your physician has recommended you make the following change in your medication:  INCREASE lasix to 40mg  once daily   Labwork: BMET in one week at the [facility], [facility]. No appt needed.  Testing/Procedures: none  Follow-Up: Your physician wants you to follow-up in: 6 months with Dr. Fletcher Anon.  You will receive a reminder letter in the mail two months in advance. If you don't receive a letter, please call our office to schedule the follow-up appointment.   Any Other Special Instructions Will Be Listed Below (If Applicable).     If you need a refill on your cardiac medications before your next appointment, please call your pharmacy.

## 2017-07-04 NOTE — Progress Notes (Signed)
Cardiology Office Note   Date:  07/04/2017   ID:  [name], DOB 1960-09-16, MRN [id number]  PCP:  Olin Hauser, DO  Cardiologist:   [name], MD   Chief Complaint  Patient presents with  . other    4 month follow up. Meds reviewed by the pt. verbally. "doing well."       History of Present Illness: [name] is a 56 y.o. male who presents for a follow-up visit regarding chronic systolic heart failure and coronary artery disease. He has known history of refractory hypertension, stage III chronic kidney disease, diabetes mellitus with diabetic neuropathy and excessive alcohol use. He presented In April of 2018r with acute systolic heart failure. EKG showed left bundle branch block of unknown duration. Troponin was borderline elevated. Echocardiogram showed severely reduced LV systolic function with an EF of 25-30% with mild mitral regurgitation and mildly dilated left atrium. Cardiac catheterization showed severe one-vessel coronary artery disease involving the mid LAD with mildly elevated left ventricular end-diastolic pressure. I performed successful angioplasty and drug-eluting stent placement to the mid LAD without complications.  Echocardiogram in July 2018 showed improvement in ejection fraction to 40-45% with mildly dilated left atrium. The dose of furosemide was decreased to 20 mg daily due to worsening renal function.  He had renal artery duplex in August which showed normal renal arteries.   He has been doing reasonably well and denies any chest pain or worsening dyspnea.  He complains of worsening leg edema with 6 pound weight gain.  He has been taking furosemide 20 mg daily.    Past Medical History:  Diagnosis Date  . Ankle pain   . Chronic systolic heart failure ([place]) 10/2016   a. 10/2016 Echo: EF 25-30%, diff HK, mild MR.  . CKD (chronic kidney disease), stage III ([place])   . Coronary artery disease    a. 10/2016 Cath/PCI: LAD 61m  (3.5x15 Xience Alpine DES). No other obstructive disease.  . Diabetes mellitus with complication ([place])   . Diabetic neuropathy ([place])   . Hyperlipidemia   . Hypertension   . 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 10/2016 Echo: EF 25-30%, diff HK.  . Pain in both feet     Past Surgical History:  Procedure Laterality Date  . CORONARY STENT INTERVENTION N/A 10/21/2016   Procedure: Coronary Stent Intervention;  Surgeon: Wellington Hampshire, MD;  Location: [facility];  Service: Cardiovascular;  Laterality: N/A;  . LEFT HEART CATH AND CORONARY ANGIOGRAPHY N/A 10/21/2016   Procedure: Left Heart Cath and Coronary Angiography;  Surgeon: Wellington Hampshire, MD;  Location: [facility];  Service: Cardiovascular;  Laterality: N/A;  . TIBIA FRACTURE SURGERY Right 2002     Current Outpatient Medications  Medication Sig Dispense Refill  . acetaminophen (TYLENOL 8 HOUR) 650 MG CR tablet Take 1 tablet (650 mg total) by mouth every 8 (eight) hours as needed for pain.    . Alpha-Lipoic Acid 600 MG CAPS Take 1 capsule by mouth daily.    [name] amLODipine (NORVASC) 10 MG tablet TAKE 1 TABLET (10 MG TOTAL) BY MOUTH DAILY.  3  . aspirin EC 81 MG tablet Take 81 mg by mouth.    [name] atorvastatin (LIPITOR) 40 MG tablet Take 1 tablet (40 mg total) by mouth daily at 6 PM. 90 tablet 3  . Blood Pressure Monitoring (BLOOD PRESSURE CUFF) MISC 1 each by Does not apply route daily.  1 each 0  . carvedilol (COREG) 25 MG tablet Take 1 tablet (25 mg total) by mouth 2 (two) times daily with a meal. 180 tablet 3  . cholecalciferol (VITAMIN D) 1000 units tablet Take 1,000 Units by mouth daily.    . clopidogrel (PLAVIX) 75 MG tablet Take 1 tablet (75 mg total) by mouth daily. 30 tablet 10  . furosemide (LASIX) 40 MG tablet Take 0.5 tablets (20 mg total) by mouth daily. 45 tablet 3  . gabapentin (NEURONTIN) 400 MG capsule Take 1 capsule (400 mg total) by mouth 3 (three) times daily. 90 capsule 0  .  glucose blood test strip Use as instructed 100 each 12  . hydrALAZINE (APRESOLINE) 50 MG tablet Take 1 tablet (50 mg total) by mouth every 8 (eight) hours. 270 tablet 3  . Insulin Pen Needle (PEN NEEDLES 31GX5/16") 31G X 8 MM MISC Check blood sugar up to 3 times daily as advised. 100 each 11  . isosorbide mononitrate (IMDUR) 60 MG 24 hr tablet Take 1 tablet (60 mg total) by mouth daily. 90 tablet 3  . LANTUS SOLOSTAR 100 UNIT/ML Solostar Pen Inject 40 Units into the skin at bedtime. 100 mL 5  . losartan (COZAAR) 100 MG tablet Take 1 tablet (100 mg total) by mouth daily. 90 tablet 3   No current facility-administered medications for this visit.     Allergies:   Lisinopril and Terbinafine and related    Social History:  The patient  reports that he is a non-smoker but has been exposed to tobacco smoke. he has never used smokeless tobacco. He reports that he drinks alcohol. He reports that he does not use drugs.   Family History:  The patient's family history includes Heart disease in his maternal grandfather; Hypertension in his mother.    ROS:  Please see the history of present illness.   Otherwise, review of systems are positive for none.   All other systems are reviewed and negative.    PHYSICAL EXAM: VS:  BP 140/82 (BP Location: Left Arm, Patient Position: Sitting, Cuff Size: Normal)   Pulse 72   Ht 5\' 9"  (1.753 m)   Wt 217 lb 8 oz (98.7 kg)   BMI 32.12 kg/m  , BMI Body mass index is 32.12 kg/m. GEN: Well nourished, well developed, in no acute distress  HEENT: normal  Neck: Jugular venous pressure is not well visualized, carotid bruits, or masses Cardiac: RRR; no murmurs, rubs, or gallops, moderate bilateral leg edema worse on the right side Respiratory:  clear to auscultation bilaterally, normal work of breathing GI: soft, nontender, nondistended, + BS MS: no deformity or atrophy  Skin: warm and dry, no rash Neuro:  Strength and sensation are intact Psych: euthymic mood, full  affect   EKG:  EKG is  ordered today.    Recent Labs: 10/18/2016: ALT 27; B Natriuretic Peptide 541.0 10/19/2016: Hemoglobin 13.3; Magnesium 1.5; Platelets 188 02/25/2017: BUN 40; Creatinine, Ser 1.62; Potassium 4.5; Sodium 136    Lipid Panel    Component Value Date/Time   CHOL 139 10/20/2016 0515   CHOL 219 (H) 08/28/2015 1135   TRIG 171 (H) 10/20/2016 0515   HDL 41 10/20/2016 0515   HDL 52 08/28/2015 1135   CHOLHDL 3.4 10/20/2016 0515   VLDL 34 10/20/2016 0515   LDLCALC 64 10/20/2016 0515   LDLCALC 107 (H) 08/28/2015 1135      Wt Readings from Last 3 Encounters:  07/04/17 217 lb 8 oz (98.7 kg)  05/15/17 211 lb 12.8 oz (96.1 kg)  02/25/17 211 lb (95.7 kg)       PAD Screen 10/28/2016  Previous PAD dx? No  Previous surgical procedure? No  Pain with walking? No  Feet/toe relief with dangling? No  Painful, non-healing ulcers? No  Extremities discolored? No      ASSESSMENT AND PLAN:  1.  Coronary artery disease involving native coronary arteries without angina:  He is doing very well with no anginal symptoms. Continue medical therapy.  2. Chronic systolic heart failure: He appears to be mildly volume overloaded with 6 pound weight gain and leg edema.  I increased furosemide to 40 mg once daily.  Check basic metabolic profile in 1 week.  3. Essential hypertension: Blood pressure is reasonably controlled on current medications.  4. Hyperlipidemia: Continue treatment with atorvastatin. Most recent LDL was 64.   Disposition:   FU with me in 6 months  Signed,  [name], MD  07/04/2017 10:57 AM    [place]

## 2017-07-11 ENCOUNTER — Other Ambulatory Visit
Admission: RE | Admit: 2017-07-11 | Discharge: 2017-07-11 | Disposition: A | Discharge status: Home / Self Care | Payer: Medicaid Other | Source: Ambulatory Visit | Attending: Cardiovascular Disease | Admitting: Cardiovascular Disease

## 2017-07-11 DIAGNOSIS — I5022 Chronic systolic (congestive) heart failure: Secondary | ICD-10-CM | POA: Insufficient documentation

## 2017-07-11 LAB — BASIC METABOLIC PANEL
ANION GAP: 6 (ref 5–15)
BUN: 33 mg/dL — ABNORMAL HIGH (ref 6–20)
CO2: 25 mmol/L (ref 22–32)
Calcium: 8.2 mg/dL — ABNORMAL LOW (ref 8.9–10.3)
Chloride: 104 mmol/L (ref 101–111)
Creatinine, Ser: 1.95 mg/dL — ABNORMAL HIGH (ref 0.61–1.24)
GFR calc Af Amer: 42 mL/min — ABNORMAL LOW (ref >60)
GFR, EST NON AFRICAN AMERICAN: 36 mL/min — AB (ref >60)
GLUCOSE: 354 mg/dL — AB (ref 65–99)
POTASSIUM: 4.2 mmol/L (ref 3.5–5.1)
SODIUM: 135 mmol/L (ref 135–145)

## 2017-08-22 ENCOUNTER — Ambulatory Visit (INDEPENDENT_AMBULATORY_CARE_PROVIDER_SITE_OTHER): Payer: Medicaid Other | Admitting: Family Medicine

## 2017-08-22 ENCOUNTER — Encounter: Payer: Self-pay | Admitting: Family Medicine

## 2017-08-22 ENCOUNTER — Ambulatory Visit: Payer: Medicaid Other | Admitting: Family Medicine

## 2017-08-22 VITALS — BP 118/68 | HR 65 | Temp 98.2°F | Resp 16 | Ht 69.0 in | Wt 220.0 lb

## 2017-08-22 DIAGNOSIS — N183 Chronic kidney disease, stage 3 unspecified: Secondary | ICD-10-CM

## 2017-08-22 DIAGNOSIS — I5022 Chronic systolic (congestive) heart failure: Secondary | ICD-10-CM

## 2017-08-22 DIAGNOSIS — E1142 Type 2 diabetes mellitus with diabetic polyneuropathy: Secondary | ICD-10-CM | POA: Diagnosis not present

## 2017-08-22 NOTE — Assessment & Plan Note (Signed)
Worsening fluid balance, now inc volume with worsening LE edema and wt gain +3 lbs recently CHF with history of reduced EF (prior 25-30% on ECHO 2018) Last seen Cardiology [facility] 06/2017 w/ inc lasix Complication with CKD-III-IV  Plan INCREASE Furosemide 40mg  to TWICE daily for 3 days then back to once daily on Monday Limit additional fluid intake Consider if need add potassium supplement Check BNP, BMET today Reviewed diet avoid high sodium foods - he did eat pork recently Advised patient to call / follow-up with Cardiology sooner due to worsening

## 2017-08-22 NOTE — Progress Notes (Signed)
Subjective:    Patient ID: [name], male    DOB: 06-16-1961, 57 y.o.   MRN: [id number]  [name] is a 57 y.o. male presenting on 08/22/2017 for Diabetes (highest 180 and lowest 140)   HPI   Specialists Cardiology - Dr Fletcher Anon [facility]) Nephrology - Dr Holley Raring ([facility])  CHRONIC DM, Type 2 with DM Neuropathy / CKD-III Last visit 05/15/17, had improved A1c 8.6 from >9. However he could still not tolerate GLP Bydureon. See prior note for background information - Interval: he again tried [employer] but failed it, said it made him "feel sick" - Today patient reports his diet has not been ideal CBG: Avg 140, Low 120, High 180. Checks fasting AM most days Meds: Lantus 40udaily - Discontinued Metformin due to CKD Reports good compliance. Tolerating well w/o side-effects Currently on ARB Lifestyle: - Diet (Non adherent to DM diet, inc pork and other salty foods as well, he has been drinking more water) - Exercise (Limited regular exericse) - History of DM retinopathy - Last seen by [facility] in 2017 with DM retinopathy, he will call to schedule - again advised similar to last visit - Admits intermittent burning, tingling at times in feet, worse at night Denies hypoglycemia, polyuria, visual changes.  CHRONIC HTN: - Today patient reports he has all meds and taking - last seen Cardiology 06/2017 they increased Furosemide from half pill to whole pill 40mg  daily Current Meds - Carvedilol 25mg  BID, Furosemide 40mg  daily, Hydralazine 50mg  q 8 hr, isosorbide mononitrate imdur 60mg , Losartan 100mg  Took some of thesemeds today. Tolerating well, w/o complaints. Admits LE edema, see below Denies CP, dyspnea, HA, dizziness / lightheadedness  CHF / Bilateral Ankle Swelling (R > L) Reports past 1 month has had worsening lower extremity bilateral ankle swelling with pitting, states he has an indentation in skin from socks, if he elevates legs he has improved  swelling. - Last seen by [facility] Cardiology Dr Fletcher Anon on 07/04/17 he had some wt gain to 217 lbs from 210, also had volume up with lower extremity edema, at that time his lasix was increased from half pill of the 40 (20mg  dose) up to one WHOLE pill for 40mg  daily, no follow-up scheduled as of today now - He is taking Lasix 40mg  daily now, says it improved his swelling before, but now not as much - Weight up to 220 lbs now +3 in 2 months - He has increased water intake with fluids - No change in salt content in diet - Denies redness or pain in swollen legs, dyspnea, chest pain or tightness, waking up with dyspnea orthopnea  Depression screen [facility] 2/9 08/22/2017 05/15/2017 01/22/2016  Decreased Interest 0 0 0  Down, Depressed, Hopeless 0 0 0  PHQ - 2 Score 0 0 0    Social History   Tobacco Use  . Smoking status: Passive Smoke Exposure - Never Smoker  . Smokeless tobacco: Never Used  Substance Use Topics  . Alcohol use: Yes  . Drug use: No    Review of Systems Per HPI unless specifically indicated above     Objective:    BP 118/68   Pulse 65   Temp 98.2 F (36.8 C) (Oral)   Resp 16   Ht 5\' 9"  (1.753 m)   Wt 220 lb (99.8 kg)   BMI 32.49 kg/m   Wt Readings from Last 3 Encounters:  08/22/17 220 lb (99.8 kg)  07/04/17 217 lb 8 oz (98.7  kg)  05/15/17 211 lb 12.8 oz (96.1 kg)    Physical Exam  Constitutional: He is oriented to person, place, and time. He appears well-developed and well-nourished. No distress.  Well-appearing, comfortable, cooperative  HENT:  Head: Normocephalic and atraumatic.  Mild dry mucus mem and tongue  Eyes: Right eye exhibits no discharge. Left eye exhibits no discharge.  Slightly injected conjunctiva bilateral  Neck: Normal range of motion. Neck supple.  No JVD  Cardiovascular: Normal rate, regular rhythm, normal heart sounds and intact distal pulses.  No murmur heard. Pulmonary/Chest: Effort normal and breath sounds normal. No respiratory distress. He  has no wheezes. He has no rales.  Good air movement, no crackles  Musculoskeletal: Normal range of motion. He exhibits edema (Worsening now +2 pitting bilateral ankle edema, no erythema non tender).  Neurological: He is alert and oriented to person, place, and time.  Skin: Skin is warm and dry. No rash noted. He is not diaphoretic. No erythema.  Slight thickening bilateral great toenails  Psychiatric: His behavior is normal.  Nursing note and vitals reviewed.   Results for orders placed or performed during the hospital encounter of 42/59/56  Basic Metabolic Panel (BMET)  Result Value Ref Range   Sodium 135 135 - 145 mmol/L   Potassium 4.2 3.5 - 5.1 mmol/L   Chloride 104 101 - 111 mmol/L   CO2 25 22 - 32 mmol/L   Glucose, Bld 354 (H) 65 - 99 mg/dL   BUN 33 (H) 6 - 20 mg/dL   Creatinine, Ser 1.95 (H) 0.61 - 1.24 mg/dL   Calcium 8.2 (L) 8.9 - 10.3 mg/dL   GFR calc non Af Amer 36 (L) >60 mL/min   GFR calc Af Amer 42 (L) >60 mL/min   Anion gap 6 5 - 15      Assessment & Plan:   Problem List Items Addressed This Visit    Chronic kidney disease (CKD), stage III (moderate) (HCC)    Persistent CKD-III Followed by Nephrology [facility] Back on ARB. Last Umicro albumin 20 improved Avoid metformin  Check BMET again for Cr trend - now had to inc lasix BID x 3 days - then resume to once Caution excessive fluid / sodium intake Follow-up with Renal      Relevant Orders   BASIC METABOLIC PANEL WITH GFR   Chronic systolic heart failure (HCC)    Worsening fluid balance, now inc volume with worsening LE edema and wt gain +3 lbs recently CHF with history of reduced EF (prior 25-30% on ECHO 2018) Last seen Cardiology [facility] 06/2017 w/ inc lasix Complication with CKD-III-IV  Plan INCREASE Furosemide 40mg  to TWICE daily for 3 days then back to once daily on Monday Limit additional fluid intake Consider if need add potassium supplement Check BNP, BMET today Reviewed diet avoid high sodium foods -  he did eat pork recently Advised patient to call / follow-up with Cardiology sooner due to worsening      Relevant Orders   Brain natriuretic peptide   DM type 2 with diabetic peripheral neuropathy ([place]) - Primary    Based on POC A1c 10.5 concern for worsening DM control now 8.6 to 10.5 Limited home readings, but poor dietary adherence per patient Complications - CKD-III, peripheral neuropathy, DM retinopathy Meds - contraindicated Metformin with CKD / Failed Bydureon BCise x 2 trials (nausea)  Plan:  1. Re-check A1c today - will do serum lab with other labs - Continue Lantus 40u daily for now - advised will  need to titrate up most likely - If A1c still elevated, will recommend trial of Victoza next (on preferred PDL) 2. Encourage improved lifestyle - low carb, low sugar diet, reduce portion size, continue improving regular exercise 3. Check CBG, bring log to next visit for review 4. Continue ASA, ARB, Statin 5. Again advised to schedule DM ophtho exam at [facility], send record 6. Follow-up 3 months DM A1c      Relevant Orders   Hemoglobin Z9Y   BASIC METABOLIC PANEL WITH GFR      No orders of the defined types were placed in this encounter.   Follow up plan: Return in about 3 months (around 11/19/2017) for DM A1c, HTN, CHF, CKDs.  Instead of Annual Physical in 3 months will return for DM f/u, given worsening control of his chronic conditions, then we can do physical later in year.  Forward note to Dr Fletcher Anon.  [name], [facility] 08/22/2017, 9:23 AM

## 2017-08-22 NOTE — Assessment & Plan Note (Signed)
Persistent CKD-III Followed by Nephrology [facility] Back on ARB. Last Umicro albumin 20 improved Avoid metformin  Check BMET again for Cr trend - now had to inc lasix BID x 3 days - then resume to once Caution excessive fluid / sodium intake Follow-up with Renal

## 2017-08-22 NOTE — Patient Instructions (Addendum)
Thank you for coming to the office today.  1. Initial sugar was 10.5 fingerstick - we will double check with the serum lab draw also check kidney and sodium/potassium, and fluid level from heart (BNP)  Stay tuned for further results - if still elevated, I would recommend a NEW medicine injection to try to control sugar once more, will send new rx  Also be careful with diet and salt (especially pork etc) - your increased swelling is likely from heart failure and kidneys  INCREASE Lasix 40mg  - take one pill TWICE daily for next 3 days (Fri - Sat - Sun) - then go back to ONE pill a day starting Monday  Call CARDIOLOGY  Schedule an appointment for worsening leg swelling as soon as you can to discuss meds again  [facility]) HeartCare at [place], [place] [address] Main: [phone]   [name], MD  Please schedule a Follow-up Appointment to: Return in about 3 months (around 11/19/2017) for DM A1c, HTN, CHF, CKDs.    If you have any other questions or concerns, please feel free to call the office or send a message through [place]. You may also schedule an earlier appointment if necessary.  Additionally, you may be receiving a survey about your experience at our office within a few days to 1 week by e-mail or mail. We value your feedback.  [name], DO [place]

## 2017-08-22 NOTE — Assessment & Plan Note (Signed)
Based on POC A1c 10.5 concern for worsening DM control now 8.6 to 10.5 Limited home readings, but poor dietary adherence per patient Complications - CKD-III, peripheral neuropathy, DM retinopathy Meds - contraindicated Metformin with CKD / Failed Bydureon BCise x 2 trials (nausea)  Plan:  1. Re-check A1c today - will do serum lab with other labs - Continue Lantus 40u daily for now - advised will need to titrate up most likely - If A1c still elevated, will recommend trial of Victoza next (on preferred PDL) 2. Encourage improved lifestyle - low carb, low sugar diet, reduce portion size, continue improving regular exercise 3. Check CBG, bring log to next visit for review 4. Continue ASA, ARB, Statin 5. Again advised to schedule DM ophtho exam at [facility], send record 6. Follow-up 3 months DM A1c

## 2017-08-23 LAB — BASIC METABOLIC PANEL WITH GFR
BUN/Creatinine Ratio: 17 (calc) (ref 6–22)
BUN: 32 mg/dL — ABNORMAL HIGH (ref 7–25)
CALCIUM: 8.9 mg/dL (ref 8.6–10.3)
CO2: 26 mmol/L (ref 20–32)
CREATININE: 1.91 mg/dL — AB (ref 0.70–1.33)
Chloride: 102 mmol/L (ref 98–110)
GFR, Est African American: 44 mL/min/{1.73_m2} — ABNORMAL LOW (ref >=60)
GFR, Est Non African American: 38 mL/min/{1.73_m2} — ABNORMAL LOW (ref >=60)
GLUCOSE: 283 mg/dL — AB (ref 65–99)
Potassium: 4.2 mmol/L (ref 3.5–5.3)
SODIUM: 136 mmol/L (ref 135–146)

## 2017-08-23 LAB — HEMOGLOBIN A1C
EAG (MMOL/L): 14.7 (calc)
Hgb A1c MFr Bld: 10.9 % of total Hgb — ABNORMAL HIGH (ref <5.7)
MEAN PLASMA GLUCOSE: 266 (calc)

## 2017-08-23 LAB — BRAIN NATRIURETIC PEPTIDE: BRAIN NATRIURETIC PEPTIDE: 112 pg/mL — AB (ref <100)

## 2017-08-25 ENCOUNTER — Other Ambulatory Visit: Payer: Self-pay | Admitting: Family Medicine

## 2017-08-25 DIAGNOSIS — E1142 Type 2 diabetes mellitus with diabetic polyneuropathy: Secondary | ICD-10-CM

## 2017-08-25 MED ORDER — LIRAGLUTIDE 18 MG/3ML ~~LOC~~ SOPN: 0.6000 mg | PEN_INJECTOR | Freq: Every day | SUBCUTANEOUS | 2 refills | Status: DC

## 2017-08-27 NOTE — Progress Notes (Signed)
Cardiology Office Note Date:  08/28/2017  Patient ID:  [name], [name] June 21, 1961, MRN [id number] PCP:  Olin Hauser, DO  Cardiologist:  Dr. Fletcher Anon, MD    Chief Complaint: Lower extremity swelling  History of Present Illness: [name] is a 57 y.o. 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   He presented to the hospital in 10/2016 with acute systolic CHF. EKG at that time showed LBBB of unknown duration. Troponin was mildly elevated with a peak of 0.03. Echo 10/20/2016, showed a severely reduced LVEF of 25-30% with mild mitral regurgitation and mildly dilated left atrium. He underwent LHC 10/21/2016, that showed severe one-vessel CAD involving the mid LAD which was 90% stenosed. He underwent successful PCI/DEs with Xience Alipine DES (3.5 x 15 mm) with 0% residual stenosis. There was residual 60% D2 stenosis, 50% mid LCx stenosis, 40% proximal RCA stenosis, 20% mid RCA stenosis. LVEDP was mildly elevated at 15 mmHg. Follow up echo in 01/2017 showed improved LVEF to 40-45%, moderate LVH, Gr1DD, calcified mitral annulus, mildly dilated left atrium, RV cavity size was normal with normal RVSF. His Lasix was decreased to 20 mg once daily in the summer of 2018 given worsening renal function. Renal artery ultrasound in 02/2017 showed normal renal arteries. He was most recently seen by Dr. Fletcher Anon on 07/04/17, and was doing reasonably well at that time. He was noted to have worsening lower extremity edema and 6 pounds weight gain. He was taking Lasix 20 mg daily. His Lasix was increased to 40 mg daily. Renal function was stable with follow up bmet on 12/28 with a SCr of 1.95. Patient saw his PCP on 2/8 for routine follow up. At that time, the patient noted  worsening lower extremity swelling for the prior month. Weight was up to 220 pounds by PCP scale which was up 9 pounds from his last PCP visit on 05/15/2017. PCP increased his Lasix to 40 mg bid x 3 days, then back down to 40 mg daily thereafter. Renal function checked at the time of his office visit on 2/8 showed a stable SCr of 1.91. BNP at that time of 112.   Patient comes in today with continued lower extremity swelling that he initially reported started ~ 3 weeks prior. He denies any SOB, chest pain, orthopnea, or early satiety. He does not weigh himself daily or check his blood pressure at home. He was eating a diet high in salt when he initially began to note the lower extremity swelling, though lately has improved his diet some. However, he does continue to eat at fast food restaurants approximately once per week. He reports drinking > 2 L of fluids daily as well. With the increasing of his Lasix as above he did note some improvement in his LE swelling, though the swelling does persist. He is compliant with medications and has brought his medications with him today for review.    Past Medical History:  Diagnosis Date  . Ankle pain   . Chronic systolic heart failure ([place]) 10/2016   a. 10/2016 Echo: EF 25-30%, diff HK, mild MR; b. TTE 7/18:improved LVEF to 40-45%, moderate LVH, Gr1DD, calcified mitral annulus, mildly dilated left atrium, RV cavity size was normal with normal RVSF  . CKD (chronic kidney disease),  stage III ([place])   . Coronary artery disease    a. 10/2016 Cath/PCI: LAD 42m (3.5x15 Xience Alpine DES). No other obstructive disease.  . Diabetes mellitus with complication ([place])   . Diabetic neuropathy ([place])   . Hyperlipidemia   . Hypertension   . 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 10/2016 Echo: EF 25-30%, diff HK.  . Pain in both feet     Past Surgical History:  Procedure Laterality Date  . CORONARY STENT INTERVENTION N/A 10/21/2016   Procedure: Coronary  Stent Intervention;  Surgeon: Wellington Hampshire, MD;  Location: [facility];  Service: Cardiovascular;  Laterality: N/A;  . LEFT HEART CATH AND CORONARY ANGIOGRAPHY N/A 10/21/2016   Procedure: Left Heart Cath and Coronary Angiography;  Surgeon: Wellington Hampshire, MD;  Location: [facility];  Service: Cardiovascular;  Laterality: N/A;  . TIBIA FRACTURE SURGERY Right 2002    Current Meds  Medication Sig  . acetaminophen (TYLENOL 8 HOUR) 650 MG CR tablet Take 1 tablet (650 mg total) by mouth every 8 (eight) hours as needed for pain.  . Alpha-Lipoic Acid 600 MG CAPS Take 1 capsule by mouth daily.  [name] amLODipine (NORVASC) 10 MG tablet TAKE 1 TABLET (10 MG TOTAL) BY MOUTH DAILY.  [name] aspirin EC 81 MG tablet Take 81 mg by mouth.  [name] atorvastatin (LIPITOR) 40 MG tablet Take 1 tablet (40 mg total) by mouth daily at 6 PM.  . Blood Pressure Monitoring (BLOOD PRESSURE CUFF) MISC 1 each by Does not apply route daily.  . carvedilol (COREG) 25 MG tablet Take 1 tablet (25 mg total) by mouth 2 (two) times daily with a meal.  . cholecalciferol (VITAMIN D) 1000 units tablet Take 1,000 Units by mouth daily.  . clopidogrel (PLAVIX) 75 MG tablet Take 1 tablet (75 mg total) by mouth daily.  . furosemide (LASIX) 40 MG tablet Take 1 tablet (40 mg total) by mouth daily.  [name] gabapentin (NEURONTIN) 400 MG capsule Take 1 capsule (400 mg total) by mouth 3 (three) times daily.  [name] glucose blood test strip Use as instructed  . hydrALAZINE (APRESOLINE) 50 MG tablet Take 1 tablet (50 mg total) by mouth every 8 (eight) hours.  . Insulin Pen Needle (PEN NEEDLES 31GX5/16") 31G X 8 MM MISC Check blood sugar up to 3 times daily as advised.  . isosorbide mononitrate (IMDUR) 60 MG 24 hr tablet Take 1 tablet (60 mg total) by mouth daily.  [name] LANTUS SOLOSTAR 100 UNIT/ML Solostar Pen Inject 40 Units into the skin at bedtime.  . liraglutide (VICTOZA) 18 MG/3ML SOPN Inject 0.1 mLs (0.6 mg total) into the skin daily. May increase  dose to 1.2mg  daily after >4 weeks if tolerating  . losartan (COZAAR) 100 MG tablet Take 1 tablet (100 mg total) by mouth daily.    Allergies:   Lisinopril and Terbinafine and related   Social History:  The patient  reports that he is a non-smoker but has been exposed to tobacco smoke. he has never used smokeless tobacco. He reports that he drinks alcohol. He reports that he does not use drugs.   Family History:  The patient's family history includes Heart disease in his maternal grandfather; Hypertension in his mother.  ROS:   Review of Systems  Constitutional: Negative for chills, diaphoresis, fever, malaise/fatigue and weight loss.  HENT: Negative for congestion.   Eyes: Negative for discharge and redness.  Respiratory: Negative for cough, hemoptysis, sputum production,  shortness of breath and wheezing.   Cardiovascular: Positive for leg swelling. Negative for chest pain, palpitations, orthopnea, claudication and PND.  Gastrointestinal: Negative for abdominal pain, blood in stool, heartburn, melena, nausea and vomiting.  Genitourinary: Negative for hematuria.  Musculoskeletal: Negative for falls and myalgias.  Skin: Negative for rash.  Neurological: Negative for dizziness, tingling, tremors, sensory change, speech change, focal weakness, loss of consciousness and weakness.  Endo/Heme/Allergies: Does not bruise/bleed easily.  Psychiatric/Behavioral: Negative for substance abuse. The patient is not nervous/anxious.   All other systems reviewed and are negative.    PHYSICAL EXAM:  VS:  BP 140/78 (BP Location: Left Arm, Patient Position: Sitting, Cuff Size: Normal)   Pulse 74   Ht 5\' 8"  (1.727 m)   Wt 217 lb 12 oz (98.8 kg)   BMI 33.11 kg/m  BMI: Body mass index is 33.11 kg/m.  Physical Exam  Constitutional: He is oriented to person, place, and time. He appears well-developed and well-nourished.  HENT:  Head: Normocephalic and atraumatic.  Eyes: Right eye exhibits no discharge.  Left eye exhibits no discharge.  Neck: Normal range of motion. No JVD present.  Cardiovascular: Normal rate, regular rhythm, S1 normal, S2 normal and normal heart sounds. Exam reveals no distant heart sounds, no friction rub, no midsystolic click and no opening snap.  No murmur heard. Pulmonary/Chest: Effort normal and breath sounds normal. No respiratory distress. He has no decreased breath sounds. He has no wheezes. He has no rales. He exhibits no tenderness.  Abdominal: Soft. He exhibits no distension. There is no tenderness.  Musculoskeletal: He exhibits edema.  1-2+ bilateral pitting edema to the mid shins  Neurological: He is alert and oriented to person, place, and time.  Skin: Skin is warm and dry. No cyanosis. Nails show no clubbing.  Psychiatric: He has a normal mood and affect. His speech is normal and behavior is normal. Judgment and thought content normal.     EKG:  Was ordered and interpreted by me today. Shows NSR, 74 bpm, LBBB (old)  Recent Labs: 10/18/2016: ALT 27 10/19/2016: Hemoglobin 13.3; Magnesium 1.5; Platelets 188 08/22/2017: Brain Natriuretic Peptide 112; BUN 32; Creat 1.91; Potassium 4.2; Sodium 136  10/20/2016: Cholesterol 139; HDL 41; LDL Cholesterol 64; Total CHOL/HDL Ratio 3.4; Triglycerides 171; VLDL 34   Estimated Creatinine Clearance: 48.6 mL/min (A) (by C-G formula based on SCr of 1.91 mg/dL (H)).   Wt Readings from Last 3 Encounters:  08/28/17 217 lb 12 oz (98.8 kg)  08/22/17 220 lb (99.8 kg)  07/04/17 217 lb 8 oz (98.7 kg)     Other studies reviewed: Additional studies/records reviewed today include: summarized above  ASSESSMENT AND PLAN:  1. Lower extremity swelling: This appears to date back to at least 06/2017. He reports some improvement in the swelling since his Lasix was increased, though the swelling continues to persist. We will stop amlodipine to evaluate if this improves his swelling. Check bmet as below. Echo echo as below. If renal function  is stable and he continues to note persistent swelling despite stopping of amlodipine, he may benefit from compression stockings/leg elevation. Check CBC.   2. Chronic systolic CHF secondary to mixed NICM/ICM: He does not appear grossly volume overloaded at this time, though does have significant lower extremity swelling. Weight is stable at 217 pounds when compared to office visit here on 07/04/2017. He does not weight himself at home. He has been eating more salt over the past ~ 1 month, though lately he has improved  his diet some. He continues to eat out at fast food restaurants ~ 1/week. For now, continue current dose of Lasix 40 mg daily until we see what his renal function is today and how his legs respond to stopping amlodipine. Check an echo to evaluate for reduced EF. Continue Coreg and losartan.   3. CAD in native coronary artery without angina: No symptoms concerning for angina at this time. Continue ASA and Plavix. Coreg and Imdur as below. Continue Lipitor. No plans for ischemic workup at this time, unless echo demonstrates a new worsening of his EF.   4. Refractory HTN: Blood pressure is mildly elevated today at 140/78. Amlodipine discontinued as above in an effort to evaluate if this improves his lower extremity swelling. We will compensate for this by increasing his hydralazine to 100 mg tid and Imdur to 90 mg daily. He will continue Coreg 25 mg bid and losartan 100 mg daily.   5. CKD stage III: Check bmp.    Disposition: F/u with me in 1 week.   Current medicines are reviewed at length with the patient today.  The patient did not have any concerns regarding medicines.  Signed, Christell Faith, PA-C 08/28/2017 2:01 PM     [facility] [address] [place], [place] [address] [phone]

## 2017-08-28 ENCOUNTER — Encounter: Payer: Self-pay | Admitting: Physician Assistant

## 2017-08-28 ENCOUNTER — Ambulatory Visit (INDEPENDENT_AMBULATORY_CARE_PROVIDER_SITE_OTHER): Payer: Medicaid Other | Admitting: Physician Assistant

## 2017-08-28 ENCOUNTER — Ambulatory Visit (INDEPENDENT_AMBULATORY_CARE_PROVIDER_SITE_OTHER): Payer: Medicaid Other

## 2017-08-28 ENCOUNTER — Other Ambulatory Visit: Payer: Self-pay

## 2017-08-28 VITALS — BP 140/78 | HR 74 | Ht 68.0 in | Wt 217.8 lb

## 2017-08-28 DIAGNOSIS — I255 Ischemic cardiomyopathy: Secondary | ICD-10-CM | POA: Diagnosis not present

## 2017-08-28 DIAGNOSIS — I2510 Atherosclerotic heart disease of native coronary artery without angina pectoris: Secondary | ICD-10-CM

## 2017-08-28 DIAGNOSIS — M7989 Other specified soft tissue disorders: Secondary | ICD-10-CM | POA: Diagnosis not present

## 2017-08-28 DIAGNOSIS — I5022 Chronic systolic (congestive) heart failure: Secondary | ICD-10-CM | POA: Diagnosis not present

## 2017-08-28 DIAGNOSIS — N183 Chronic kidney disease, stage 3 unspecified: Secondary | ICD-10-CM

## 2017-08-28 DIAGNOSIS — Z79899 Other long term (current) drug therapy: Secondary | ICD-10-CM | POA: Diagnosis not present

## 2017-08-28 DIAGNOSIS — I10 Essential (primary) hypertension: Secondary | ICD-10-CM | POA: Diagnosis not present

## 2017-08-28 MED ORDER — ISOSORBIDE MONONITRATE ER 60 MG PO TB24: 90.0000 mg | ORAL_TABLET | Freq: Every day | ORAL | 1 refills | Status: DC

## 2017-08-28 MED ORDER — HYDRALAZINE HCL 100 MG PO TABS: 100.0000 mg | ORAL_TABLET | Freq: Three times a day (TID) | ORAL | 1 refills | Status: DC

## 2017-08-28 NOTE — Patient Instructions (Signed)
Medication Instructions:  Your physician has recommended you make the following change in your medication:  1- STOP Amlodipine. 2- INCREASE Hydralazine to 100 mg by mouth three times a day. 3- INCREASE Imdur to 90 mg (1.5 tablets) by mouth once a day.   Labwork: Your physician recommends that you return for lab work in: TODAY (CBC, BMET).   Testing/Procedures: Your physician has requested that you have an echocardiogram. Echocardiography is a painless test that uses sound waves to create images of your heart. It provides your doctor with information about the size and shape of your heart and how well your heart's chambers and valves are working. This procedure takes approximately one hour. There are no restrictions for this procedure. - PLEASE HAVE ECHO BEFORE appointment NEXT WEEK.    Follow-Up: Your physician recommends that you schedule a follow-up appointment in: [place] (NEXT Thursday PLEASE).  If you need a refill on your cardiac medications before your next appointment, please call your pharmacy.   Echocardiogram An echocardiogram, or echocardiography, uses sound waves (ultrasound) to produce an image of your heart. The echocardiogram is simple, painless, obtained within a short period of time, and offers valuable information to your health care provider. The images from an echocardiogram can provide information such as:  Evidence of coronary artery disease (CAD).  Heart size.  Heart muscle function.  Heart valve function.  Aneurysm detection.  Evidence of a past heart attack.  Fluid buildup around the heart.  Heart muscle thickening.  Assess heart valve function.  Tell a health care provider about:  Any allergies you have.  All medicines you are taking, including vitamins, herbs, eye drops, creams, and over-the-counter medicines.  Any problems you or family members have had with anesthetic medicines.  Any blood disorders you have.  Any surgeries  you have had.  Any medical conditions you have.  Whether you are pregnant or may be pregnant. What happens before the procedure? No special preparation is needed. Eat and drink normally. What happens during the procedure?  In order to produce an image of your heart, gel will be applied to your chest and a wand-like tool (transducer) will be moved over your chest. The gel will help transmit the sound waves from the transducer. The sound waves will harmlessly bounce off your heart to allow the heart images to be captured in real-time motion. These images will then be recorded.  You may need an IV to receive a medicine that improves the quality of the pictures. What happens after the procedure? You may return to your normal schedule including diet, activities, and medicines, unless your health care provider tells you otherwise. This information is not intended to replace advice given to you by your health care provider. Make sure you discuss any questions you have with your health care provider. Document Released: 06/28/2000 Document Revised: 02/17/2016 Document Reviewed: 03/08/2013 Elsevier Interactive Patient Education  2017 [employer].

## 2017-08-29 LAB — ECHOCARDIOGRAM COMPLETE
Height: 68 in
Weight: 3484 oz

## 2017-08-29 LAB — CBC WITH DIFFERENTIAL/PLATELET
BASOS ABS: 0 10*3/uL (ref 0.0–0.2)
Basos: 0 %
EOS (ABSOLUTE): 0.1 10*3/uL (ref 0.0–0.4)
EOS: 2 %
Hematocrit: 37.1 % — ABNORMAL LOW (ref 37.5–51.0)
Hemoglobin: 12.9 g/dL — ABNORMAL LOW (ref 13.0–17.7)
IMMATURE GRANULOCYTES: 0 %
Immature Grans (Abs): 0 10*3/uL (ref 0.0–0.1)
Lymphocytes Absolute: 1.5 10*3/uL (ref 0.7–3.1)
Lymphs: 27 %
MCH: 32.8 pg (ref 26.6–33.0)
MCHC: 34.8 g/dL (ref 31.5–35.7)
MCV: 94 fL (ref 79–97)
MONOCYTES: 11 %
MONOS ABS: 0.6 10*3/uL (ref 0.1–0.9)
NEUTROS PCT: 60 %
Neutrophils Absolute: 3.3 10*3/uL (ref 1.4–7.0)
Platelets: 218 10*3/uL (ref 150–379)
RBC: 3.93 x10E6/uL — AB (ref 4.14–5.80)
RDW: 12.2 % — AB (ref 12.3–15.4)
WBC: 5.5 10*3/uL (ref 3.4–10.8)

## 2017-08-29 LAB — BASIC METABOLIC PANEL
BUN/Creatinine Ratio: 20 (ref 9–20)
BUN: 42 mg/dL — ABNORMAL HIGH (ref 6–24)
CALCIUM: 9.5 mg/dL (ref 8.7–10.2)
CHLORIDE: 95 mmol/L — AB (ref 96–106)
CO2: 21 mmol/L (ref 20–29)
Creatinine, Ser: 2.12 mg/dL — ABNORMAL HIGH (ref 0.76–1.27)
GFR calc Af Amer: 39 mL/min/{1.73_m2} — ABNORMAL LOW (ref >59)
GFR, EST NON AFRICAN AMERICAN: 34 mL/min/{1.73_m2} — AB (ref >59)
GLUCOSE: 228 mg/dL — AB (ref 65–99)
POTASSIUM: 4.3 mmol/L (ref 3.5–5.2)
Sodium: 136 mmol/L (ref 134–144)

## 2017-09-03 NOTE — Progress Notes (Signed)
Cardiology Office Note Date:  09/04/2017  Patient ID:  Harold, [name] Jul 02, 1961, MRN [id number] PCP:  Olin Hauser, DO  Cardiologist:  Dr. Fletcher Anon, MD    Chief Complaint: Follow up  History of Present Illness: [name] is a 57 y.o. 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   He presented to the hospital in 10/2016 with acute systolic CHF. EKG at that time showed LBBB of unknown duration. Troponin was mildly elevated with a peak of 0.03. Echo 10/20/2016, showed a severely reduced LVEF of 25-30% with mild mitral regurgitation and mildly dilated left atrium. He underwent LHC 10/21/2016, that showed severe one-vessel CAD involving the mid LAD which was 90% stenosed. He underwent successful PCI/DES with 0% residual stenosis. There was residual 60% D2 stenosis, 50% mid LCx stenosis, 40% proximal RCA stenosis, 20% mid RCA stenosis. LVEDP was mildly elevated at 15 mmHg. Follow up echo in 01/2017 showed improved LVEF to 40-45%, moderate LVH, Gr1DD, calcified mitral annulus, mildly dilated left atrium, RV cavity size was normal with normal RVSF. His Lasix was decreased to 20 mg once daily in the summer of 2018 given worsening renal function. Renal artery ultrasound in 02/2017 showed normal renal arteries. He was seen by Dr. Fletcher Anon on 07/04/17, and was doing reasonably well at that time. He was noted to have worsening lower extremity edema and a 6 pound weight gain. He was taking Lasix 20 mg daily. His Lasix was increased to 40 mg daily. Renal function was stable with follow up bmet on 12/28 with a SCr of 1.95. Patient saw his PCP on 2/8 for routine follow up. At that time, the patient noted worsening lower extremity swelling for the prior month. Weight  was up to 220 pounds by PCP scale which was up 9 pounds from his last PCP visit on 05/15/2017. PCP increased his Lasix to 40 mg bid x 3 days, then back down to 40 mg daily thereafter. Renal function checked at the time of his office visit on 2/8 showed a stable SCr of 1.91. BNP at that time of 112. Patient was seen on 08/28/17 with continued lower extremity swelling that he initially reported started approximately 3 weeks prior. He was not short of breath and denied any chest pain, orthopnea, or early satiety. He was eating a diet high in salt when he initially began to notice the lower extremity swelling. With the above increase and his Lasix done by outside office he did note some improvement in his lower extremity swelling. He underwent echocardiogram on 08/28/17 that showed EF 40-45%, hypokinesis of the septal, anterior, anteroseptal myocardium possibly secondary to conduction abnormality, Gr1DD, mild mitral regurgitation,  mildly dilated left atrium, RVSF normal, PASP could not be accurately estimated. Labs on 2/14 showed BUN 42, serum creatinine 2.12 (baseline approximately 1.6-1.9), potassium 4.3, hemoglobin 12.9. It was recommended he decrease his Lasix to 20 mg daily. His amlodipine was held to evaluate if this helped with his swelling.   He comes in doing well from a cardiac perspective.  His lower extremity swelling has resolved since stopping his amlodipine.  Blood pressure remains well controlled.  No shortness of breath or chest pain.  Weight is down 1 pound from last visit.  No orthopnea, cough, PND, early satiety,  or abdominal distention.  He does wonder if his Victoza is causing some vision issues/scleral injection.  Unfortunately, it appears our office was unable to get in touch with him following his labs that were drawn on 2/14 with the instructions to decrease his Lasix to 20 mg daily.  Therefore, he has remained on Lasix 40 mg daily.  He does not have any other concerns at this time.   Past  Medical History:  Diagnosis Date  . Ankle pain   . Chronic systolic heart failure ([place]) 10/2016   a. 10/2016 Echo: EF 25-30%, diff HK, mild MR; b. TTE 7/18:improved LVEF to 40-45%, moderate LVH, Gr1DD, calcified mitral annulus, mildly dilated left atrium, RV cavity size was normal with normal RVSF  . CKD (chronic kidney disease), stage III ([place])   . Coronary artery disease    a. 10/2016 Cath/PCI: LAD 77m (3.5x15 Xience Alpine DES). No other obstructive disease.  . Diabetes mellitus with complication ([place])   . Diabetic neuropathy ([place])   . Hyperlipidemia   . Hypertension   . 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 10/2016 Echo: EF 25-30%, diff HK.  . Pain in both feet     Past Surgical History:  Procedure Laterality Date  . CORONARY STENT INTERVENTION N/A 10/21/2016   Procedure: Coronary Stent Intervention;  Surgeon: Wellington Hampshire, MD;  Location: [facility];  Service: Cardiovascular;  Laterality: N/A;  . LEFT HEART CATH AND CORONARY ANGIOGRAPHY N/A 10/21/2016   Procedure: Left Heart Cath and Coronary Angiography;  Surgeon: Wellington Hampshire, MD;  Location: [facility];  Service: Cardiovascular;  Laterality: N/A;  . TIBIA FRACTURE SURGERY Right 2002    Current Meds  Medication Sig  . acetaminophen (TYLENOL 8 HOUR) 650 MG CR tablet Take 1 tablet (650 mg total) by mouth every 8 (eight) hours as needed for pain.  . Alpha-Lipoic Acid 600 MG CAPS Take 1 capsule by mouth daily.  [name] aspirin EC 81 MG tablet Take 81 mg by mouth.  [name] atorvastatin (LIPITOR) 40 MG tablet Take 1 tablet (40 mg total) by mouth daily at 6 PM.  . Blood Pressure Monitoring (BLOOD PRESSURE CUFF) MISC 1 each by Does not apply route daily.  . carvedilol (COREG) 25 MG tablet Take 1 tablet (25 mg total) by mouth 2 (two) times daily with a meal.  . cholecalciferol (VITAMIN D) 1000 units tablet Take 1,000 Units by mouth daily.  . clopidogrel (PLAVIX) 75 MG tablet Take 1 tablet (75 mg total)  by mouth daily.  . furosemide (LASIX) 40 MG tablet Take 1 tablet (40 mg total) by mouth daily.  [name] gabapentin (NEURONTIN) 400 MG capsule Take 1 capsule (400 mg total) by mouth 3 (three) times daily.  [name] glucose blood test strip Use as instructed  . hydrALAZINE (APRESOLINE) 100 MG tablet Take 1 tablet (100 mg total) by mouth 3 (three) times daily.  . Insulin Pen Needle (PEN NEEDLES 31GX5/16") 31G X 8 MM MISC Check blood sugar up to 3 times daily as advised.  . isosorbide mononitrate (IMDUR) 60 MG 24 hr tablet Take 1.5 tablets (90 mg total) by mouth daily.  [name] LANTUS SOLOSTAR 100 UNIT/ML Solostar Pen Inject 40 Units into the skin at bedtime.  . liraglutide (VICTOZA) 18 MG/3ML SOPN Inject 0.1 mLs (0.6 mg total) into the skin daily. May increase dose to 1.2mg  daily after >4 weeks if tolerating  . losartan (COZAAR) 100 MG tablet Take 1 tablet (100  mg total) by mouth daily.    Allergies:   Lisinopril and Terbinafine and related   Social History:  The patient  reports that he is a non-smoker but has been exposed to tobacco smoke. he has never used smokeless tobacco. He reports that he drinks alcohol. He reports that he does not use drugs.   Family History:  The patient's family history includes Heart disease in his maternal grandfather; Hypertension in his mother.  ROS:   Review of Systems  Constitutional: Negative for chills, diaphoresis, fever, malaise/fatigue and weight loss.  HENT: Negative for congestion.   Eyes: Positive for redness. Negative for discharge.  Respiratory: Negative for cough, hemoptysis, sputum production, shortness of breath and wheezing.   Cardiovascular: Negative for chest pain, palpitations, orthopnea, claudication, leg swelling and PND.  Gastrointestinal: Negative for abdominal pain, blood in stool, heartburn, melena, nausea and vomiting.  Genitourinary: Negative for hematuria.  Musculoskeletal: Negative for falls and myalgias.  Skin: Negative for rash.  Neurological:  Negative for dizziness, tingling, tremors, sensory change, speech change, focal weakness, loss of consciousness and weakness.  Endo/Heme/Allergies: Does not bruise/bleed easily.  Psychiatric/Behavioral: Negative for substance abuse. The patient is not nervous/anxious.   All other systems reviewed and are negative.    PHYSICAL EXAM:  VS:  BP 134/78 (BP Location: Left Arm, Patient Position: Sitting, Cuff Size: Normal)   Pulse 74   Ht 5\' 8"  (1.727 m)   Wt 216 lb (98 kg)   BMI 32.84 kg/m  BMI: Body mass index is 32.84 kg/m.  Physical Exam  Constitutional: He is oriented to person, place, and time. He appears well-developed and well-nourished.  HENT:  Head: Normocephalic and atraumatic.  Eyes: Right eye exhibits no discharge. Left eye exhibits no discharge. Right conjunctiva is injected. Left conjunctiva is injected.  Neck: Normal range of motion. No JVD present.  Cardiovascular: Normal rate, regular rhythm, S1 normal, S2 normal and normal heart sounds. Exam reveals no distant heart sounds, no friction rub, no midsystolic click and no opening snap.  No murmur heard. Pulses:      Posterior tibial pulses are 2+ on the right side, and 2+ on the left side.  Pulmonary/Chest: Effort normal and breath sounds normal. No respiratory distress. He has no decreased breath sounds. He has no wheezes. He has no rales. He exhibits no tenderness.  Abdominal: Soft. He exhibits no distension. There is no tenderness.  Musculoskeletal: He exhibits no edema.  Neurological: He is alert and oriented to person, place, and time.  Skin: Skin is warm and dry. No cyanosis. Nails show no clubbing.  Psychiatric: He has a normal mood and affect. His speech is normal and behavior is normal. Judgment and thought content normal.     EKG:  Was not ordered today.   Recent Labs: 10/18/2016: ALT 27 10/19/2016: Magnesium 1.5 08/22/2017: Brain Natriuretic Peptide 112 08/28/2017: BUN 42; Creatinine, Ser 2.12; Hemoglobin 12.9;  Platelets 218; Potassium 4.3; Sodium 136  10/20/2016: Cholesterol 139; HDL 41; LDL Cholesterol 64; Total CHOL/HDL Ratio 3.4; Triglycerides 171; VLDL 34   Estimated Creatinine Clearance: 43.6 mL/min (A) (by C-G formula based on SCr of 2.12 mg/dL (H)).   Wt Readings from Last 3 Encounters:  09/04/17 216 lb (98 kg)  08/28/17 217 lb 12 oz (98.8 kg)  08/22/17 220 lb (99.8 kg)     Other studies reviewed: Additional studies/records reviewed today include: summarized above  ASSESSMENT AND PLAN:  1. Lower extremity swelling: This has resolved with discontinuation of amlodipine.  We  will continue to hold amlodipine at this time.  This was most likely secondary to his calcium channel blocker.  Less likely CHF exacerbation given stable ejection fraction on recent echocardiogram and labs indicating a prerenal/dehydrated state.  Lasix has been decreased as below.  The  2. Chronic systolic CHF secondary to mixed NICM/ICM: He does not appear grossly volume overloaded at this time.  Labs drawn on 08/28/17 showed a prerenal state.  At that time it was recommended he decrease his Lasix to 20 mg daily, unfortunately our office was unable to get in touch with him and he has remained on 40 mg of Lasix daily.  We will decrease his Lasix to 20 mg daily.  Check BMP as below.  Continue Coreg, losartan, Imdur/hydralazine.  CHF education provided.  Recent echocardiogram showed stable mildly reduced EF as detailed above.  3. CAD in native coronary artery without angina: No symptoms concerning for angina at this time.  Continue dual antiplatelet therapy with ASA 81 mg and Plavix 75 mill grams daily.  Continue Coreg and Imdur as below.  Continue Lipitor.  No plans for further ischemic workup at this time.  Aggressive risk factor modification and secondary prevention advised.  4. Refractory hypertension: Blood pressure is reasonably controlled at this time.  Continue current medications including carvedilol 25 mg twice daily,  Lasix 20 mg daily, hydralazine 100 mg 3 times daily, Imdur 90 mg daily, and losartan 100 mg daily.  5. CKD stage III: Check BMP.    6. Scleral injection: Patient wonders if this is from his Victoza.  Reviewed Up-To-Date.  Advised patient to follow-up with PCP.  Disposition: F/u with me in 6 months.  Current medicines are reviewed at length with the patient today.  The patient did not have any concerns regarding medicines.  Signed, Christell Faith, PA-C 09/04/2017 1:16 PM     [address] [place] [address], [place] [address] [phone]

## 2017-09-04 ENCOUNTER — Encounter: Payer: Self-pay | Admitting: Physician Assistant

## 2017-09-04 ENCOUNTER — Ambulatory Visit: Payer: Medicaid Other | Admitting: Physician Assistant

## 2017-09-04 ENCOUNTER — Other Ambulatory Visit: Payer: Self-pay | Admitting: *Deleted

## 2017-09-04 VITALS — BP 134/78 | HR 74 | Ht 68.0 in | Wt 216.0 lb

## 2017-09-04 DIAGNOSIS — M7989 Other specified soft tissue disorders: Secondary | ICD-10-CM | POA: Diagnosis not present

## 2017-09-04 DIAGNOSIS — I10 Essential (primary) hypertension: Secondary | ICD-10-CM | POA: Diagnosis not present

## 2017-09-04 DIAGNOSIS — I5022 Chronic systolic (congestive) heart failure: Secondary | ICD-10-CM | POA: Diagnosis not present

## 2017-09-04 DIAGNOSIS — I2510 Atherosclerotic heart disease of native coronary artery without angina pectoris: Secondary | ICD-10-CM

## 2017-09-04 DIAGNOSIS — H579 Unspecified disorder of eye and adnexa: Secondary | ICD-10-CM | POA: Diagnosis not present

## 2017-09-04 DIAGNOSIS — I5020 Unspecified systolic (congestive) heart failure: Secondary | ICD-10-CM

## 2017-09-04 DIAGNOSIS — N183 Chronic kidney disease, stage 3 unspecified: Secondary | ICD-10-CM

## 2017-09-04 DIAGNOSIS — H5789 Other specified disorders of eye and adnexa: Secondary | ICD-10-CM

## 2017-09-04 MED ORDER — FUROSEMIDE 40 MG PO TABS: 20.0000 mg | ORAL_TABLET | Freq: Every day | ORAL | 3 refills | Status: DC

## 2017-09-04 NOTE — Patient Instructions (Signed)
Medication Instructions:  Your physician recommends that you continue on your current medications as directed. Please refer to the Current Medication list given to you today.   Labwork: Your physician recommends that you return for lab work in: [place] (BMET).   Testing/Procedures: none  Follow-Up: Your physician wants you to follow-up in: [place]. You will receive a reminder letter in the mail two months in advance. If you don't receive a letter, please call our office to schedule the follow-up appointment.   If you need a refill on your cardiac medications before your next appointment, please call your pharmacy.

## 2017-09-05 LAB — BASIC METABOLIC PANEL
BUN/Creatinine Ratio: 22 — ABNORMAL HIGH (ref 9–20)
BUN: 44 mg/dL — ABNORMAL HIGH (ref 6–24)
CALCIUM: 9.1 mg/dL (ref 8.7–10.2)
CHLORIDE: 103 mmol/L (ref 96–106)
CO2: 19 mmol/L — AB (ref 20–29)
Creatinine, Ser: 1.98 mg/dL — ABNORMAL HIGH (ref 0.76–1.27)
GFR calc non Af Amer: 36 mL/min/{1.73_m2} — ABNORMAL LOW (ref >59)
GFR, EST AFRICAN AMERICAN: 42 mL/min/{1.73_m2} — AB (ref >59)
Glucose: 199 mg/dL — ABNORMAL HIGH (ref 65–99)
POTASSIUM: 4.3 mmol/L (ref 3.5–5.2)
SODIUM: 138 mmol/L (ref 134–144)

## 2017-09-12 ENCOUNTER — Other Ambulatory Visit: Payer: Self-pay | Admitting: Family Medicine

## 2017-09-12 DIAGNOSIS — E1142 Type 2 diabetes mellitus with diabetic polyneuropathy: Secondary | ICD-10-CM

## 2017-11-14 ENCOUNTER — Other Ambulatory Visit: Payer: Self-pay | Admitting: Family Medicine

## 2017-11-14 DIAGNOSIS — E1142 Type 2 diabetes mellitus with diabetic polyneuropathy: Secondary | ICD-10-CM

## 2017-11-21 ENCOUNTER — Encounter: Payer: Self-pay | Admitting: Family Medicine

## 2017-11-21 ENCOUNTER — Ambulatory Visit (INDEPENDENT_AMBULATORY_CARE_PROVIDER_SITE_OTHER): Payer: Medicaid Other | Admitting: Family Medicine

## 2017-11-21 VITALS — BP 115/76 | HR 71 | Temp 97.8°F | Resp 16 | Ht 68.0 in | Wt 211.4 lb

## 2017-11-21 DIAGNOSIS — N183 Chronic kidney disease, stage 3 unspecified: Secondary | ICD-10-CM

## 2017-11-21 DIAGNOSIS — E1142 Type 2 diabetes mellitus with diabetic polyneuropathy: Secondary | ICD-10-CM

## 2017-11-21 DIAGNOSIS — I10 Essential (primary) hypertension: Secondary | ICD-10-CM

## 2017-11-21 LAB — POCT GLYCOSYLATED HEMOGLOBIN (HGB A1C): Hemoglobin A1C: 10.1 — AB (ref ?–5.7)

## 2017-11-21 MED ORDER — LIRAGLUTIDE 18 MG/3ML ~~LOC~~ SOPN: 1.8000 mg | PEN_INJECTOR | Freq: Every day | SUBCUTANEOUS | 2 refills | Status: DC

## 2017-11-21 NOTE — Progress Notes (Signed)
Subjective:    Patient ID: [name], male    DOB: [birth year], 57 y.o.   MRN: [id number]  [name] is a 57 y.o. male presenting on 11/21/2017 for Diabetes   HPI  Specialists Cardiology - Dr Fletcher Anon [facility]) Nephrology - Dr Holley Raring ([facility])  CHRONIC DM, Type 2 with DM Neuropathy/ CKD-III Last visit 08/2017 worsening DM control A1c >10, new start Victoza 0.6 initial dose, he did not titrate up dose as advised. Admits poor diet, "eating what I want" still not always adhering to improved DM diet - Today patient reportshis diet has not been ideal CBG: Avg 130-150, Low 120, High 180. Checks fasting AM most days Meds: Lantus 40udaily - Discontinued Metformin due to CKD. Failed Bydureon in past due to nausea Reports good compliance. Tolerating well w/o side-effects Currently on ARB Lifestyle: - Diet (Non adherent to DM diet) - Exercise (Limited regular exericse) - History of DM retinopathy-Last seen by [facility] in 2017 with DM retinopathy, he will call to schedule - again advised similar to last visit -Admits intermittent burning, tingling at times in feet, worse at night Denies hypoglycemia, polyuria, visual changes.  CHRONIC HTN / Chronic Kidney Diseas III Followed by Nephrology Last seen Cardiology 09/04/17, reduced furosemide from 40 to 20mg  - now last Cr improved Improved today on current regimen Current Meds - Carvedilol 25mg  BID, Furosemide 20mg  daily, Hydralazine 50mg  q 8 hr, isosorbide mononitrate imdur 60mg , Losartan 100mg  Took some of thesemeds today. Tolerating well, w/o complaints. Denies CP, dyspnea, HA, dizziness / lightheadedness  Depression screen [facility] 2/9 08/22/2017 05/15/2017 01/22/2016  Decreased Interest 0 0 0  Down, Depressed, Hopeless 0 0 0  PHQ - 2 Score 0 0 0    Social History   Tobacco Use  . Smoking status: Passive Smoke Exposure - Never Smoker  . Smokeless tobacco: Never Used  Substance Use Topics  . Alcohol use:  Yes  . Drug use: No    Review of Systems Per HPI unless specifically indicated above     Objective:    BP 115/76   Pulse 71   Temp 97.8 F (36.6 C) (Oral)   Resp 16   Ht 5\' 8"  (1.727 m)   Wt 211 lb 6.4 oz (95.9 kg)   SpO2 96%   BMI 32.14 kg/m   Wt Readings from Last 3 Encounters:  11/21/17 211 lb 6.4 oz (95.9 kg)  09/04/17 216 lb (98 kg)  08/28/17 217 lb 12 oz (98.8 kg)    Physical Exam  Constitutional: He is oriented to person, place, and time. He appears well-developed and well-nourished. No distress.  Well-appearing, comfortable, cooperative  HENT:  Head: Normocephalic and atraumatic.  Mild dry mucus mem and tongue  Eyes: Right eye exhibits no discharge. Left eye exhibits no discharge.  Slightly injected conjunctiva bilateral  Neck: Normal range of motion. Neck supple.  Cardiovascular: Normal rate, regular rhythm, normal heart sounds and intact distal pulses.  No murmur heard. Pulmonary/Chest: Effort normal and breath sounds normal. No respiratory distress. He has no wheezes. He has no rales.  Good air movement, no crackles  Musculoskeletal: Normal range of motion. He exhibits edema (Improved trace pitting edema bilateral lower ankles extremity).  Neurological: He is alert and oriented to person, place, and time.  Skin: Skin is warm and dry. No rash noted. He is not diaphoretic. No erythema.  Psychiatric: His behavior is normal.  Nursing note and vitals reviewed.  Recent Labs    05/15/17  7121 08/22/17 0908 11/21/17 0955  HGBA1C 8.6* 10.9* 10.1*    Results for orders placed or performed in visit on 11/21/17  POCT HgB A1C  Result Value Ref Range   Hemoglobin A1C 10.1 (A) 5.7      Assessment & Plan:   Problem List Items Addressed This Visit    Chronic kidney disease (CKD), stage III (moderate) (HCC)    Persistent CKD-III - last Cr slightly improved Followed by Nephrology [facility] On ARB Avoid metformin Improved on lower dose lasix now 20mg  dailiy  tolerating well Caution excessive fluid / sodium intake Follow-up with Renal      DM type 2 with diabetic peripheral neuropathy ([place]) - Primary    Improved A1c 10.1 from prior 10.9, still remains uncontrolled Did not titrate up on Victoza as advised Limited home readings, but poor dietary adherence per patient Complications - CKD-III, peripheral neuropathy, DM retinopathy Meds - contraindicated Metformin with CKD / Failed Bydureon BCise x 2 trials (nausea)  Plan:  1. INCREASE Victoza from 0.6 to 1.2 daily inj - after 4 weeks if still elevated CBG and tolerating medicine, may increase to 1.8mg  dailiy inj - Continue Lantus 40u daily for now - advised will need to titrate up most likely - after finish inc victoza 2. Encourage improved lifestyle - low carb, low sugar diet, reduce portion size, continue improving regular exercise 3. Check CBG, bring log to next visit for review 4. Continue ASA, ARB, Statin 5. Again advised to schedule DM ophtho exam at [facility], send record 6. Follow-up 3 months DM A1c      Relevant Medications   liraglutide (VICTOZA) 18 MG/3ML SOPN   Other Relevant Orders   POCT HgB A1C (Completed)   Resistant hypertension    Improved BP control on current meds - did not miss dose Complication with CKD-III, CAD    Plan:  1. Continue current BP regimen - Carvedilol 25mg  BID, Furosemide 20mg  daily, Hydralazine 50mg  q 8 hr, isosorbide mononitrate imdur 60mg , Losartan 100mg  2. Encourage improved lifestyle - low sodium diet, regular exercise 3. Continue monitor BP outside office, bring readings to next visit, if persistently >140/90 or new symptoms notify office sooner 4. Follow-up 3 months         Meds ordered this encounter  Medications  . liraglutide (VICTOZA) 18 MG/3ML SOPN    Sig: Inject 0.3 mLs (1.8 mg total) into the skin daily. Start with 1.2mg  daily for 4 weeks then increase to 1.8    Dispense:  1 pen    Refill:  2    Dose increase, keep refills on  file but change dosing on instructions    Follow up plan: Return in about 3 months (around 02/21/2018) for DM A1c.  [name], [facility] 11/22/2017, 10:27 AM

## 2017-11-21 NOTE — Patient Instructions (Addendum)
Thank you for coming to the office today.  A1c 10.1, improved but still not at goal  Increase Victoza from 0.6 (first dose) now go up to 1.2 (2nd dose on pen) - use this dose DAILY for next 4 WEEKS then if tolerating well, without nausea vomiting, you can increase to THIRD DOSE (Max dose) 1.8mg  daily injection - New rx sent to pharmacy  We will call them to schedule an appointment for DM Eye Exam - stay tuned for apt  [facility] [address] [place], [place] [address] Phone: [phone] [email or web address]   Please schedule a Follow-up Appointment to: Return in about 3 months (around 02/21/2018) for DM A1c.  If you have any other questions or concerns, please feel free to call the office or send a message through [facility]. You may also schedule an earlier appointment if necessary.  Additionally, you may be receiving a survey about your experience at our office within a few days to 1 week by e-mail or mail. We value your feedback.  [name], DO [place]

## 2017-11-22 ENCOUNTER — Encounter: Payer: Self-pay | Admitting: Family Medicine

## 2017-11-22 NOTE — Assessment & Plan Note (Signed)
Improved BP control on current meds - did not miss dose Complication with CKD-III, CAD    Plan:  1. Continue current BP regimen - Carvedilol 25mg  BID, Furosemide 20mg  daily, Hydralazine 50mg  q 8 hr, isosorbide mononitrate imdur 60mg , Losartan 100mg  2. Encourage improved lifestyle - low sodium diet, regular exercise 3. Continue monitor BP outside office, bring readings to next visit, if persistently >140/90 or new symptoms notify office sooner 4. Follow-up 3 months

## 2017-11-22 NOTE — Assessment & Plan Note (Signed)
Persistent CKD-III - last Cr slightly improved Followed by Nephrology [facility] On ARB Avoid metformin Improved on lower dose lasix now 20mg  dailiy tolerating well Caution excessive fluid / sodium intake Follow-up with Renal

## 2017-11-22 NOTE — Assessment & Plan Note (Signed)
Improved A1c 10.1 from prior 10.9, still remains uncontrolled Did not titrate up on Victoza as advised Limited home readings, but poor dietary adherence per patient Complications - CKD-III, peripheral neuropathy, DM retinopathy Meds - contraindicated Metformin with CKD / Failed Bydureon BCise x 2 trials (nausea)  Plan:  1. INCREASE Victoza from 0.6 to 1.2 daily inj - after 4 weeks if still elevated CBG and tolerating medicine, may increase to 1.8mg  dailiy inj - Continue Lantus 40u daily for now - advised will need to titrate up most likely - after finish inc victoza 2. Encourage improved lifestyle - low carb, low sugar diet, reduce portion size, continue improving regular exercise 3. Check CBG, bring log to next visit for review 4. Continue ASA, ARB, Statin 5. Again advised to schedule DM ophtho exam at [facility], send record 6. Follow-up 3 months DM A1c

## 2018-01-12 ENCOUNTER — Other Ambulatory Visit: Payer: Self-pay | Admitting: *Deleted

## 2018-01-12 ENCOUNTER — Telehealth: Payer: Self-pay | Admitting: Cardiovascular Disease

## 2018-01-12 MED ORDER — FUROSEMIDE 40 MG PO TABS: 20.0000 mg | ORAL_TABLET | Freq: Every day | ORAL | 1 refills | Status: DC

## 2018-01-12 NOTE — Telephone Encounter (Signed)
°*  STAT* If patient is at the pharmacy, call can be transferred to refill team.   1. Which medications need to be refilled? (please list name of each medication and dose if known) furosemide 40 MG - 0.5 daily  2. Which pharmacy/location (including street and city if local pharmacy) is medication to be sent to? [facility] [place]  3. Do they need a 30 day or 90 day supply? 90 day

## 2018-01-12 NOTE — Telephone Encounter (Signed)
Requested Prescriptions   Signed Prescriptions Disp Refills  . furosemide (LASIX) 40 MG tablet 45 tablet 1    Sig: Take 0.5 tablets (20 mg total) by mouth daily.    Authorizing Provider: [name] A    Ordering User: Britt Bottom

## 2018-02-09 ENCOUNTER — Telehealth: Payer: Self-pay | Admitting: Cardiovascular Disease

## 2018-02-09 ENCOUNTER — Other Ambulatory Visit: Payer: Self-pay

## 2018-02-09 MED ORDER — FUROSEMIDE 40 MG PO TABS: 20.0000 mg | ORAL_TABLET | Freq: Every day | ORAL | 0 refills | Status: DC

## 2018-02-09 NOTE — Telephone Encounter (Signed)
°*  STAT* If patient is at the pharmacy, call can be transferred to refill team.   1. Which medications need to be refilled? (please list name of each medication and dose if known) Fursemide 40 mg   2. Which pharmacy/location (including street and city if local pharmacy) is medication to be sent to? [facility] [place]   3. Do they need a 30 day or 90 day supply? 30 day

## 2018-02-09 NOTE — Telephone Encounter (Signed)
furosemide (LASIX) 40 MG tablet 15 tablet 0 02/09/2018    Sig - Route: Take 0.5 tablets (20 mg total) by mouth daily. - Oral   Sent to pharmacy as: furosemide (LASIX) 40 MG tablet   E-Prescribing Status: Receipt confirmed by pharmacy (02/09/2018 1:29 PM EDT)   Pharmacy   [id number] [place], [place] - [address]

## 2018-02-23 ENCOUNTER — Encounter: Payer: Self-pay | Admitting: Family Medicine

## 2018-02-23 ENCOUNTER — Ambulatory Visit (INDEPENDENT_AMBULATORY_CARE_PROVIDER_SITE_OTHER): Payer: Medicaid Other | Admitting: Family Medicine

## 2018-02-23 VITALS — BP 121/66 | HR 69 | Temp 98.0°F | Resp 16 | Ht 68.0 in | Wt 211.0 lb

## 2018-02-23 DIAGNOSIS — E1142 Type 2 diabetes mellitus with diabetic polyneuropathy: Secondary | ICD-10-CM | POA: Diagnosis not present

## 2018-02-23 LAB — POCT GLYCOSYLATED HEMOGLOBIN (HGB A1C): Hemoglobin A1C: 9.4 % — AB (ref 4.0–5.6)

## 2018-02-23 NOTE — Progress Notes (Signed)
Subjective:    Patient ID: [name], male    DOB: [birth year], 57 y.o.   MRN: [id number]  [name] is a 57 y.o. male presenting on 02/23/2018 for Diabetes   HPI  Specialists Cardiology - Dr Fletcher Anon [facility] Cardiology) Nephrology - Dr Holley Raring ([facility])  CHRONIC DM, Type 2 with DM Neuropathy/ CKD-III Last visit 11/21/17 for DM he had improved A1c from 10.9 to 10.1 on Victoza with inc from 0.6 to 1.2, see background info in last note. No new concern today. CBG: Avg 140-150, Low >100, High < 200. Checks fasting AM most days Meds: - Victoza 1.2mg  inj daily - Lantus 40udaily - OFF Metformin due to CKD. Failed Bydureon in past due to nausea Reports good compliance. Tolerating well w/o side-effects Currently on ARB Lifestyle: - Diet (Still slowly improving DM diet) - Exercise (Limited regular exericse) - History of DM retinopathy-Last seen by [facility] in 2017 with DM retinopathy - we requested apt for him last time but he did not receive call and now he is asking about scheduling this apt -Admits intermittent burning, tingling at times in feet, worse at night Denies hypoglycemia, polyuria, visual changes.  PMH - HTN, CKD-III  Depression screen [facility] 2/9 02/23/2018 08/22/2017 05/15/2017  Decreased Interest 0 0 0  Down, Depressed, Hopeless 0 0 0  PHQ - 2 Score 0 0 0    Social History   Tobacco Use  . Smoking status: Passive Smoke Exposure - Never Smoker  . Smokeless tobacco: Never Used  Substance Use Topics  . Alcohol use: Yes  . Drug use: No    Review of Systems Per HPI unless specifically indicated above     Objective:    BP 121/66   Pulse 69   Temp 98 F (36.7 C) (Oral)   Resp 16   Ht 5\' 8"  (1.727 m)   Wt 211 lb (95.7 kg)   BMI 32.08 kg/m   Wt Readings from Last 3 Encounters:  02/23/18 211 lb (95.7 kg)  11/21/17 211 lb 6.4 oz (95.9 kg)  09/04/17 216 lb (98 kg)    Physical Exam  Constitutional: He is oriented to person, place, and time.  He appears well-developed and well-nourished. No distress.  Well-appearing, comfortable, cooperative, obese  HENT:  Head: Normocephalic and atraumatic.  Mouth/Throat: Oropharynx is clear and moist.  Eyes: Conjunctivae are normal. Right eye exhibits no discharge. Left eye exhibits no discharge.  Neck: Normal range of motion. Neck supple. No thyromegaly present.  Cardiovascular: Normal rate, regular rhythm, normal heart sounds and intact distal pulses.  No murmur heard. Pulmonary/Chest: Effort normal and breath sounds normal. No respiratory distress. He has no wheezes. He has no rales.  Musculoskeletal: Normal range of motion. He exhibits no edema.  Lymphadenopathy:    He has no cervical adenopathy.  Neurological: He is alert and oriented to person, place, and time.  Skin: Skin is warm and dry. No rash noted. He is not diaphoretic. No erythema.  Psychiatric: He has a normal mood and affect. His behavior is normal.  Well groomed, good eye contact, normal speech and thoughts  Nursing note and vitals reviewed.    Recent Labs    08/22/17 0908 11/21/17 0955 02/23/18 0837  HGBA1C 10.9* 10.1* 9.4*    Results for orders placed or performed in visit on 02/23/18  POCT HgB A1C  Result Value Ref Range   Hemoglobin A1C 9.4 (A) 4.0 - 5.6 %      Assessment &  Plan:   Problem List Items Addressed This Visit    DM type 2 with diabetic peripheral neuropathy ([place]) - Primary    Improved A1c from 10.1 to 9.4, overall still uncontrolled above goal with hyperglycemia Limited home readings, still improving diet. Some med adherence questions Complications - CKD-III, peripheral neuropathy, DM retinopathy Meds - contraindicated Metformin with CKD / Failed Bydureon BCise x 2 trials (nausea)  Plan:  1. INCREASE Victoza from 1.2 to 1.8 daily inj - Continue Lantus 40u daily for now - will titrate up in future 2. Encourage improved lifestyle - low carb, low sugar diet, reduce portion size, continue  improving regular exercise 3. Check CBG, bring log to next visit for review 4. Continue ASA, ARB, Statin - restart statin he was not taking at 6pm and forgot to take 5. Again advised to schedule DM ophtho exam at [facility], send record - given phone number for him to call to schedule this - Next visit due DM foot 6. Follow-up 3 months DM A1c      Relevant Orders   POCT HgB A1C (Completed)      No orders of the defined types were placed in this encounter.   Follow up plan: Return in about 3 months (around 05/26/2018) for DM A1c, HTN.  [name], [facility] 02/23/2018, 9:01 AM

## 2018-02-23 NOTE — Assessment & Plan Note (Addendum)
Improved A1c from 10.1 to 9.4, overall still uncontrolled above goal with hyperglycemia Limited home readings, still improving diet. Some med adherence questions Complications - CKD-III, peripheral neuropathy, DM retinopathy Meds - contraindicated Metformin with CKD / Failed Bydureon BCise x 2 trials (nausea)  Plan:  1. INCREASE Victoza from 1.2 to 1.8 daily inj - Continue Lantus 40u daily for now - will titrate up in future 2. Encourage improved lifestyle - low carb, low sugar diet, reduce portion size, continue improving regular exercise 3. Check CBG, bring log to next visit for review 4. Continue ASA, ARB, Statin - restart statin he was not taking at 6pm and forgot to take 5. Again advised to schedule DM ophtho exam at [facility], send record - given phone number for him to call to schedule this - Next visit due DM foot 6. Follow-up 3 months DM A1c

## 2018-02-23 NOTE — Patient Instructions (Addendum)
Thank you for coming to the office today.  Increase Victoza to 1.8 mg per injection dose - (3rd notch on pen) - one injection every day  Continue Lantus insulin 40 units daily  Call to schedule Diabetic Eye Exam - have them fax [place] the report  [facility] [address] [place], [place] [address] Phone: [phone]  Please restart Atorvastating - may take later in evening or bedtime, does not have to be at 6pm  Please schedule a Follow-up Appointment to: Return in about 3 months (around 05/26/2018) for DM A1c, HTN.  If you have any other questions or concerns, please feel free to call the office or send a message through [place]. You may also schedule an earlier appointment if necessary.  Additionally, you may be receiving a survey about your experience at our office within a few days to 1 week by e-mail or mail. We value your feedback.  [name], DO [place]

## 2018-03-01 IMAGING — CR DG CHEST 2V
2 series · 2 of 2 positions shown · non-contrast
Comparison: 08/09/2005

CLINICAL DATA: Bilateral foot swelling with shortness of breath

EXAM:
CHEST  2 VIEW

[chest pa]
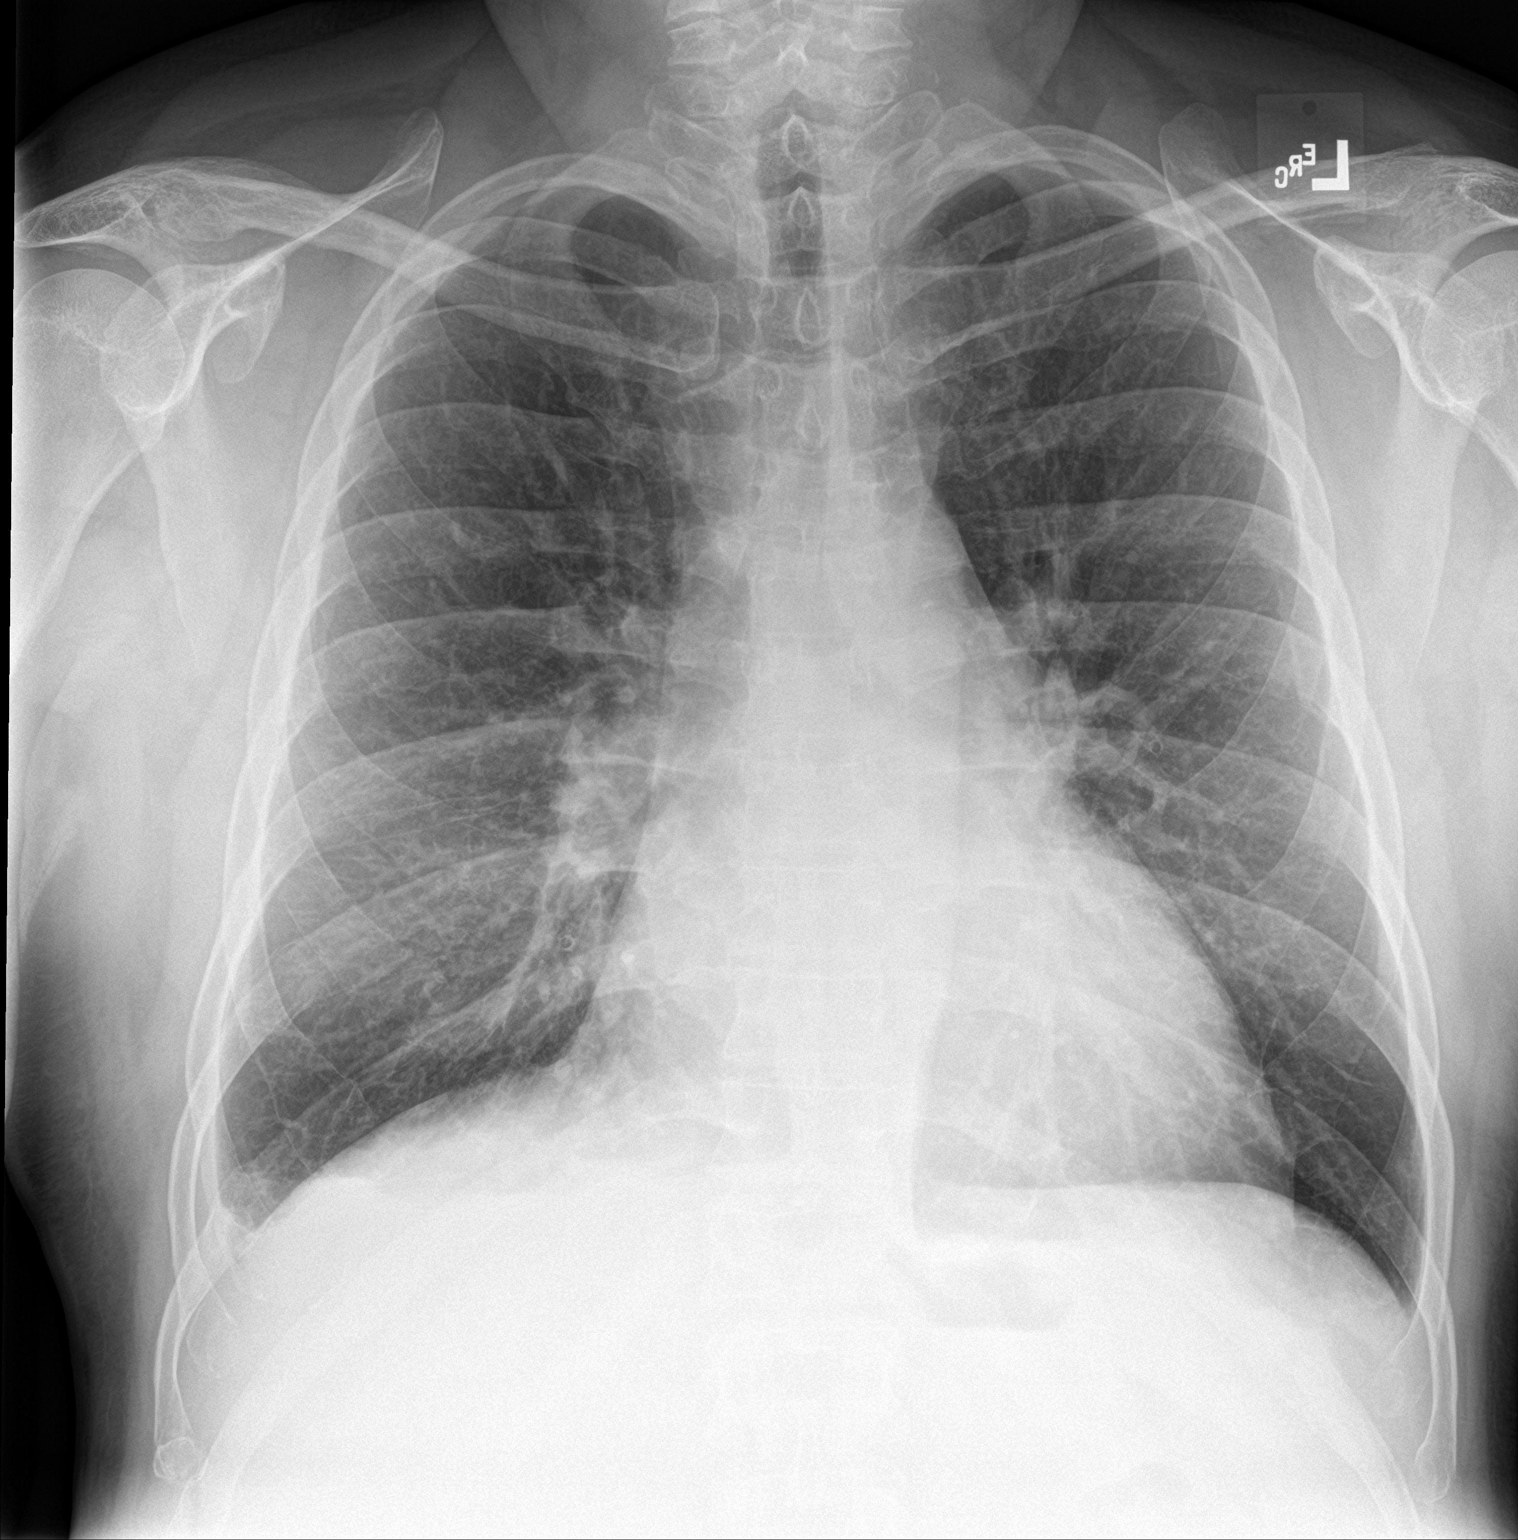

[chest lat]
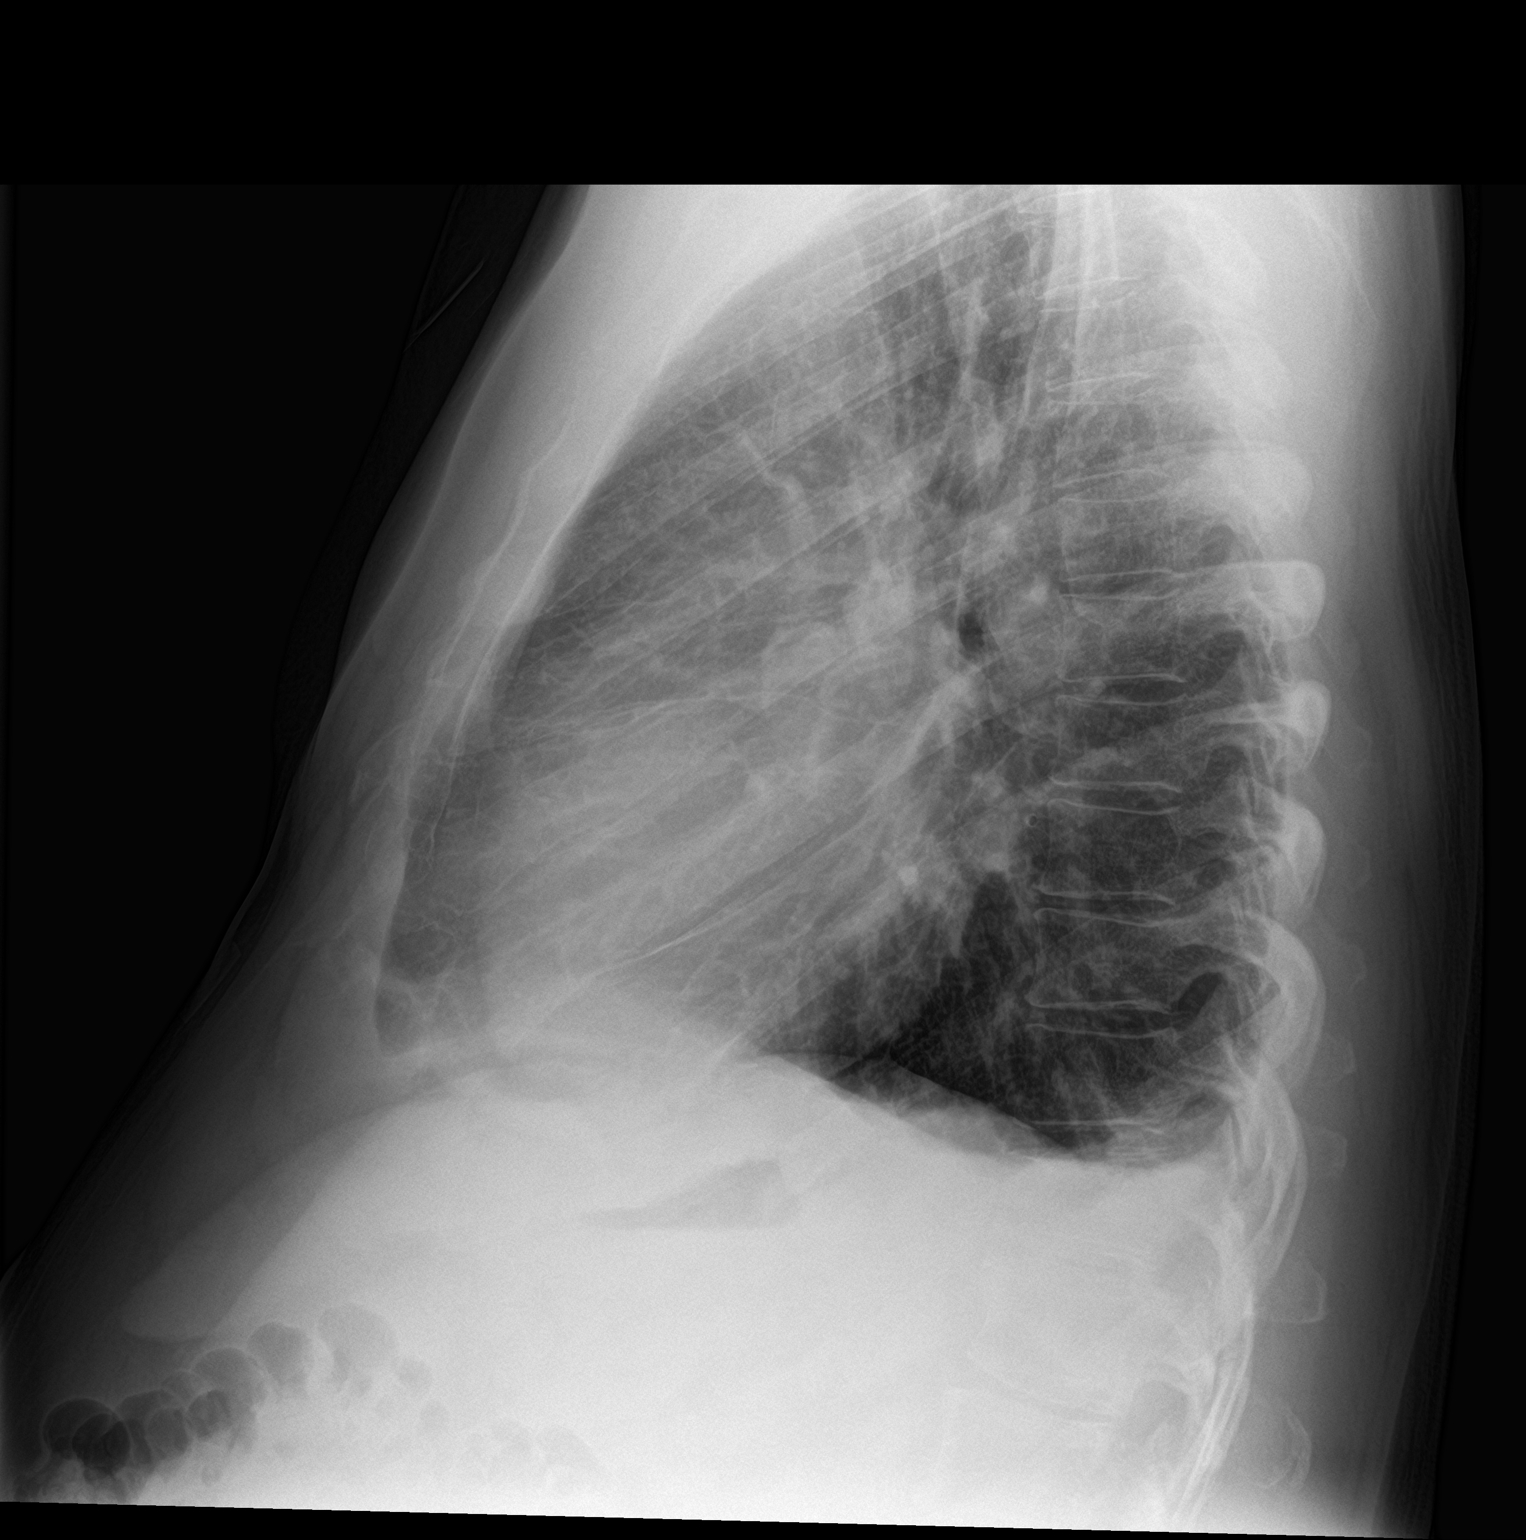

[2 of 2 positions shown; findings below may reference images not displayed]

FINDINGS: There are tiny bilateral pleural effusion. No focal consolidation.
The heart is slightly enlarged and there is mild central congestion.
No pneumothorax. Mild wedging at the thoracolumbar junction.
IMPRESSION: Mild cardiomegaly with central congestion and tiny bilateral
effusions.

## 2018-03-17 ENCOUNTER — Ambulatory Visit (INDEPENDENT_AMBULATORY_CARE_PROVIDER_SITE_OTHER): Payer: Medicaid Other | Admitting: Nurse Practitioner

## 2018-03-17 ENCOUNTER — Encounter: Payer: Self-pay | Admitting: Nurse Practitioner

## 2018-03-17 VITALS — BP 110/72 | HR 83 | Ht 67.0 in | Wt 212.5 lb

## 2018-03-17 DIAGNOSIS — Z13220 Encounter for screening for lipoid disorders: Secondary | ICD-10-CM | POA: Diagnosis not present

## 2018-03-17 DIAGNOSIS — E785 Hyperlipidemia, unspecified: Secondary | ICD-10-CM | POA: Diagnosis not present

## 2018-03-17 DIAGNOSIS — I2510 Atherosclerotic heart disease of native coronary artery without angina pectoris: Secondary | ICD-10-CM

## 2018-03-17 DIAGNOSIS — E1169 Type 2 diabetes mellitus with other specified complication: Secondary | ICD-10-CM

## 2018-03-17 DIAGNOSIS — I255 Ischemic cardiomyopathy: Secondary | ICD-10-CM | POA: Diagnosis not present

## 2018-03-17 DIAGNOSIS — I5022 Chronic systolic (congestive) heart failure: Secondary | ICD-10-CM | POA: Diagnosis not present

## 2018-03-17 MED ORDER — FUROSEMIDE 20 MG PO TABS: 20.0000 mg | ORAL_TABLET | Freq: Every day | ORAL | 3 refills | Status: DC

## 2018-03-17 NOTE — Progress Notes (Signed)
Office Visit    Patient Name: [name] Date of Encounter: 03/17/2018  Primary Care Provider:  Olin Hauser, DO Primary Cardiologist:  [name], MD  Chief Complaint    57 y/o ? who presents for follow-up in relation to his history of CAD status post LAD drug-eluting stent placement in April 2018, mixed ischemic and nonischemic cardiomyopathy, diabetes, hypertension, hyperlipidemia, and stage III chronic kidney disease.  Past Medical History    Past Medical History:  Diagnosis Date  . Ankle pain   . Chronic combined systolic (congestive) and diastolic (congestive) heart failure ([place]) 10/2016   a. 10/2016 Echo: EF 25-30%, diff HK, mild MR; b. 01/2017 Echo:  EF 40-45%, moderate LVH, Gr1DD, calcified mitral annulus, mildly dilated left atrium, RV cavity size was normal with normal RVSF; c. 08/2017 Echo: EF 40-45%, sept, antsept HK, Gr1DD, Mild MR, mildly dil LA. Nl RV fxn.  . CKD (chronic kidney disease), stage III ([place])   . Coronary artery disease    a. 10/2016 Cath/PCI: LAD 69m (3.5x15 Xience [place]pine DES). No other obstructive disease.  . Diabetic neuropathy ([place])   . Hyperlipidemia   . Hypertension   . 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 10/2016 Echo: EF 25-30%, diff HK; b. 01/2017 Echo: EF 40-45%; c. 08/2017 Echo: EF 40-45%.  . Pain in both feet    Past Surgical History:  Procedure Laterality Date  . CORONARY STENT INTERVENTION N/A 10/21/2016   Procedure: Coronary Stent Intervention;  Surgeon: Wellington Hampshire, MD;  Location: [place] [facility];  Service: Cardiovascular;  Laterality: N/A;  . LEFT HEART CATH AND CORONARY ANGIOGRAPHY N/A 10/21/2016   Procedure: Left Heart Cath and Coronary Angiography;  Surgeon: Wellington Hampshire, MD;  Location: [place] [facility];  Service: Cardiovascular;  Laterality: N/A;  . TIBIA FRACTURE SURGERY Right 2002    Allergies  Allergies  Allergen Reactions  . Lisinopril     Hyperkalemia   .  Terbinafine And Related Rash    History of Present Illness    57 year old male with above complex past medical history including CAD, mixed ischemic and nonischemic cardiomyopathy, diabetes, hypertension, hyperlipidemia, and stage III chronic kidney disease.  He is status post drug-eluting stent placement to the LAD in April 2018.  At that time, he was found to have severe LV dysfunction with an EF of 25 to 30%.  Diffuse hypokinesis were noted and felt to be out of proportion to the LAD disease.  He also has a history of alcohol abuse.  A follow-up echocardiogram in July 2018 showed improved LV function with an EF of 40 to 45%.  He was seen in February of this year with complaints of lower extremity swelling in the setting of a fast food diet.  Amlodipine was discontinued as it was felt to potentially be contributing.  Follow-up echocardiogram again showed stable LV dysfunction with an EF of 40 to 45% and grade 1 diastolic dysfunction.  Lab work was checked the day of the office visit with a finding of a creatinine of 2.12, which is above his baseline of 1.6-1.9.  We had intended to drop his Lasix dose to 20 mg daily but he was unable to be reached and thus continued on 40 mg daily.  When he followed up, in late February, swelling had resolved off of amlodipine.  His Lasix dose was subsequently reduced to 20 mg daily and follow-up creatinine was closer to baseline at 1.98.  Since his last visit, he reports doing well from a cardiac standpoint.  He does not routinely weigh himself at home but has been more careful with eating fast food or processed foods.  He ran out of Lasix in July and it was refilled on temporary basis at the end of the month pending follow-up today.  He tries to remain active but does not routinely exercise.  He denies chest pain, palpitations, dyspnea, PND, orthopnea, dizziness, syncope, edema, or early satiety.  Home Medications    Prior to Admission medications   Medication Sig  Start Date End Date Taking? Authorizing Provider  acetaminophen (TYLENOL 8 HOUR) 650 MG CR tablet Take 1 tablet (650 mg total) by mouth every 8 (eight) hours as needed for pain. 08/28/15   Luciana Axe, NP  Alpha-Lipoic Acid 600 MG CAPS Take 1 capsule by mouth daily.    [provider]  aspirin EC 81 MG tablet Take 81 mg by mouth. 09/22/07   [provider]  atorvastatin (LIPITOR) 40 MG tablet Take 1 tablet (40 mg total) by mouth daily at 6 PM. 02/10/17   Karamalegos, Devonne Doughty, DO  Blood Pressure Monitoring (BLOOD PRESSURE CUFF) MISC 1 each by Does not apply route daily. 02/12/16   Luciana Axe, NP  carvedilol (COREG) 25 MG tablet Take 1 tablet (25 mg total) by mouth 2 (two) times daily with a meal. 05/15/17   Karamalegos, Devonne Doughty, DO  cholecalciferol (VITAMIN D) 1000 units tablet Take 1,000 Units by mouth daily.    [provider]  clopidogrel (PLAVIX) 75 MG tablet Take 1 tablet (75 mg total) by mouth daily. 10/23/16   Gladstone Lighter, MD  furosemide (LASIX) 40 MG tablet Take 0.5 tablets (20 mg total) by mouth daily. 02/09/18   Dunn, Areta Haber, PA-C  gabapentin (NEURONTIN) 400 MG capsule TAKE 2-3 CAPSULES (800-1,200 MG TOTAL) BY MOUTH 3 (THREE) TIMES DAILY. 11/14/17   Karamalegos, Devonne Doughty, DO  glucose blood test strip Use as instructed 09/12/16   Olin Hauser, DO  hydrALAZINE (APRESOLINE) 100 MG tablet Take 1 tablet (100 mg total) by mouth 3 (three) times daily. 08/28/17   Dunn, Areta Haber, PA-C  Insulin Pen Needle (B-D ULTRAFINE III SHORT PEN) 31G X 8 MM MISC CHECK BLOOD SUGAR UP TO 3 TIMES DAILY AS ADVISED. 09/12/17   Karamalegos, Devonne Doughty, DO  isosorbide mononitrate (IMDUR) 60 MG 24 hr tablet Take 1.5 tablets (90 mg total) by mouth daily. 08/28/17   Dunn, Areta Haber, PA-C  LANTUS SOLOSTAR 100 UNIT/ML Solostar Pen Inject 40 Units into the skin at bedtime. 02/10/17   Karamalegos, Alexander J, DO  liraglutide (VICTOZA) 18 MG/3ML SOPN Inject 0.3 mLs (1.8 mg  total) into the skin daily. Start with 1.2mg  daily for 4 weeks then increase to 1.8 11/21/17   Karamalegos, Alexander J, DO  losartan (COZAAR) 100 MG tablet Take 1 tablet (100 mg total) by mouth daily. 07/04/17 10/02/17  Wellington Hampshire, MD    Review of Systems    He denies chest pain, palpitations, dyspnea, pnd, orthopnea, n, v, dizziness, syncope, edema, weight gain, or early satiety.  All other systems reviewed and are otherwise negative except as noted above.  Physical Exam    VS:  BP 138/80 (BP Location: Left Arm, Patient Position: Sitting, Cuff Size: Large)   Pulse 83   Ht 5\' 7"  (1.702 m)   Wt 212 lb 8 oz (96.4 kg)   BMI 33.28 kg/m  , BMI Body mass  index is 33.28 kg/m. GEN: Well nourished, well developed, in no acute distress. HEENT: normal. Neck: Supple, no JVD, carotid bruits, or masses. Cardiac: RRR, no murmurs, rubs, or gallops. No clubbing, cyanosis, edema.  Radials/DP/PT 2+ and equal bilaterally.  Respiratory:  Respirations regular and unlabored, clear to auscultation bilaterally. GI: Soft, nontender, nondistended, BS + x 4. MS: no deformity or atrophy. Skin: warm and dry, no rash. Neuro:  Strength and sensation are intact. Psych: Normal affect.  Accessory Clinical Findings    ECG personally reviewed by me today -regular sinus rhythm, 83, PVC, left axis deviation, left bundle branch block- no acute changes.  Assessment & Plan    1.  Coronary artery disease: Status post LAD stenting in April 2018.  He has done well since then without any recurrent chest pain or dyspnea.  He does not routine exercise but says he tries to remain active.  He remains on aspirin, statin, beta-blocker, Plavix, nitrate, and ARB therapy.  2.  Ischemic cardiomyopathy/HFrEF: EF improved to 40 to 45% by echo in July 2019 and noted again in February 2019.  He did have some swelling when on amlodipine previously but has not had any recurrence of it.  He does not weigh himself at home but is actually  down 4 pounds since last visit.  He remains on beta-blocker, ARB, hydralazine, nitrate, and Lasix.  I am refilling his Lasix today and will check a complete metabolic panel.  We discussed the importance of daily weights, sodium restriction, medication compliance, and symptom reporting and he verbalizes understanding.   3.  Essential hypertension: Stable on beta-blocker, ARB, hydralazine, nitrate, and diuretic therapy.  4.  Hyperlipidemia: Last lipids were checked in April 2018.  LDL was 64 at that time.  I will check a direct LDL and LFTs today.  5.  Type 2 diabetes mellitus: Followed closely by primary care.  Last A1c was 9.4 in August 12.  He is on Victoza in the setting of CHF.  6.  Stage III chronic kidney disease: This was stable in February.  Follow-up labs today.  7.  Disposition: Provided labs are stable, would plan to follow-up in 6 months or sooner if necessary.  Murray Hodgkins, NP 03/17/2018, 4:00 PM

## 2018-03-17 NOTE — Patient Instructions (Signed)
Medication Instructions:  Your physician recommends that you continue on your current medications as directed. Please refer to the Current Medication list given to you today.   Labwork: Your physician recommends that you return for lab work in: TODAY - CMET, LIPID, DIRECT LDL.   Testing/Procedures: none  Follow-Up: Your physician recommends that you schedule a follow-up appointment in: [place].   If you need a refill on your cardiac medications before your next appointment, please call your pharmacy.

## 2018-03-18 ENCOUNTER — Telehealth: Payer: Self-pay | Admitting: Nurse Practitioner

## 2018-03-18 DIAGNOSIS — Z79899 Other long term (current) drug therapy: Secondary | ICD-10-CM

## 2018-03-18 DIAGNOSIS — I5022 Chronic systolic (congestive) heart failure: Secondary | ICD-10-CM

## 2018-03-18 LAB — COMPREHENSIVE METABOLIC PANEL
A/G RATIO: 1.5 (ref 1.2–2.2)
ALT: 39 IU/L (ref 0–44)
AST: 23 IU/L (ref 0–40)
Albumin: 4 g/dL (ref 3.5–5.5)
Alkaline Phosphatase: 73 IU/L (ref 39–117)
BILIRUBIN TOTAL: 0.5 mg/dL (ref 0.0–1.2)
BUN / CREAT RATIO: 18 (ref 9–20)
BUN: 43 mg/dL — ABNORMAL HIGH (ref 6–24)
CO2: 23 mmol/L (ref 20–29)
Calcium: 9.4 mg/dL (ref 8.7–10.2)
Chloride: 97 mmol/L (ref 96–106)
Creatinine, Ser: 2.39 mg/dL — ABNORMAL HIGH (ref 0.76–1.27)
GFR calc non Af Amer: 29 mL/min/{1.73_m2} — ABNORMAL LOW (ref >59)
GFR, EST AFRICAN AMERICAN: 33 mL/min/{1.73_m2} — AB (ref >59)
Globulin, Total: 2.7 g/dL (ref 1.5–4.5)
Glucose: 470 mg/dL — ABNORMAL HIGH (ref 65–99)
POTASSIUM: 4.9 mmol/L (ref 3.5–5.2)
SODIUM: 134 mmol/L (ref 134–144)
Total Protein: 6.7 g/dL (ref 6.0–8.5)

## 2018-03-18 LAB — LIPID PANEL
CHOL/HDL RATIO: 3.6 ratio (ref 0.0–5.0)
CHOLESTEROL TOTAL: 126 mg/dL (ref 100–199)
HDL: 35 mg/dL — AB (ref >39)
LDL Calculated: 29 mg/dL (ref 0–99)
TRIGLYCERIDES: 308 mg/dL — AB (ref 0–149)
VLDL Cholesterol Cal: 62 mg/dL — ABNORMAL HIGH (ref 5–40)

## 2018-03-18 LAB — LDL CHOLESTEROL, DIRECT: LDL Direct: 52 mg/dL (ref 0–99)

## 2018-03-18 MED ORDER — FUROSEMIDE 20 MG PO TABS: 10.0000 mg | ORAL_TABLET | Freq: Every day | ORAL | 3 refills | Status: DC

## 2018-03-18 NOTE — Telephone Encounter (Signed)
S/w patient. He verbalized understanding to take furosemide 10 mg by mouth once a day. That will be 1/2 of a 20 mg tablet. Prescription resent to pharmacy to reflect this. Patient again verbalized understanding to go to the [facility] on 03/25/18 for lab work.

## 2018-03-18 NOTE — Telephone Encounter (Signed)
My apologies.  In that case, I'd have him halve the 20mg  tab, so that he'd only be taking 10 daily.

## 2018-03-19 ENCOUNTER — Telehealth: Payer: Self-pay | Admitting: Family Medicine

## 2018-03-19 NOTE — Telephone Encounter (Signed)
[name] with [employer] called to check the status of prior authorization request for victoza pen injector that was faxed Aug. 21st.  The reference key is [id number] and his call back is [phone]

## 2018-03-19 NOTE — Telephone Encounter (Signed)
Called [name] and notify that it was approved by [employer] back in 02/2018.

## 2018-03-22 ENCOUNTER — Other Ambulatory Visit: Payer: Self-pay | Admitting: Family Medicine

## 2018-03-22 DIAGNOSIS — E1142 Type 2 diabetes mellitus with diabetic polyneuropathy: Secondary | ICD-10-CM

## 2018-03-25 ENCOUNTER — Other Ambulatory Visit (INDEPENDENT_AMBULATORY_CARE_PROVIDER_SITE_OTHER): Payer: Medicaid Other

## 2018-03-25 ENCOUNTER — Other Ambulatory Visit: Payer: Self-pay | Admitting: *Deleted

## 2018-03-25 DIAGNOSIS — Z79899 Other long term (current) drug therapy: Secondary | ICD-10-CM

## 2018-03-25 DIAGNOSIS — I10 Essential (primary) hypertension: Secondary | ICD-10-CM

## 2018-03-25 DIAGNOSIS — I5022 Chronic systolic (congestive) heart failure: Secondary | ICD-10-CM

## 2018-03-26 LAB — BASIC METABOLIC PANEL
BUN/Creatinine Ratio: 22 — ABNORMAL HIGH (ref 9–20)
BUN: 44 mg/dL — AB (ref 6–24)
CALCIUM: 8.7 mg/dL (ref 8.7–10.2)
CHLORIDE: 97 mmol/L (ref 96–106)
CO2: 20 mmol/L (ref 20–29)
Creatinine, Ser: 2.03 mg/dL — ABNORMAL HIGH (ref 0.76–1.27)
GFR calc non Af Amer: 35 mL/min/{1.73_m2} — ABNORMAL LOW (ref >59)
GFR, EST AFRICAN AMERICAN: 41 mL/min/{1.73_m2} — AB (ref >59)
GLUCOSE: 293 mg/dL — AB (ref 65–99)
Potassium: 4.5 mmol/L (ref 3.5–5.2)
Sodium: 134 mmol/L (ref 134–144)

## 2018-04-17 ENCOUNTER — Other Ambulatory Visit: Payer: Self-pay

## 2018-04-17 ENCOUNTER — Telehealth: Payer: Self-pay | Admitting: Family Medicine

## 2018-04-17 DIAGNOSIS — E1142 Type 2 diabetes mellitus with diabetic polyneuropathy: Secondary | ICD-10-CM

## 2018-04-17 MED ORDER — LIRAGLUTIDE 18 MG/3ML ~~LOC~~ SOPN: 1.8000 mg | PEN_INJECTOR | Freq: Every day | SUBCUTANEOUS | 5 refills | Status: DC

## 2018-04-17 NOTE — Telephone Encounter (Signed)
Pt stopped by requesting refill on Victoza called into  [facility]

## 2018-05-28 ENCOUNTER — Encounter: Payer: Self-pay | Admitting: Family Medicine

## 2018-05-28 ENCOUNTER — Ambulatory Visit (INDEPENDENT_AMBULATORY_CARE_PROVIDER_SITE_OTHER): Payer: Medicaid Other | Admitting: Family Medicine

## 2018-05-28 VITALS — BP 120/70 | HR 64 | Resp 15 | Ht 67.0 in | Wt 211.2 lb

## 2018-05-28 DIAGNOSIS — N183 Chronic kidney disease, stage 3 unspecified: Secondary | ICD-10-CM

## 2018-05-28 DIAGNOSIS — E1169 Type 2 diabetes mellitus with other specified complication: Secondary | ICD-10-CM | POA: Diagnosis not present

## 2018-05-28 DIAGNOSIS — I10 Essential (primary) hypertension: Secondary | ICD-10-CM

## 2018-05-28 DIAGNOSIS — E1142 Type 2 diabetes mellitus with diabetic polyneuropathy: Secondary | ICD-10-CM | POA: Diagnosis not present

## 2018-05-28 DIAGNOSIS — E785 Hyperlipidemia, unspecified: Secondary | ICD-10-CM | POA: Diagnosis not present

## 2018-05-28 LAB — POCT GLYCOSYLATED HEMOGLOBIN (HGB A1C): Hemoglobin A1C: 11.9 % — AB (ref 4.0–5.6)

## 2018-05-28 MED ORDER — ATORVASTATIN CALCIUM 40 MG PO TABS: 40.0000 mg | ORAL_TABLET | Freq: Every day | ORAL | 3 refills | Status: DC

## 2018-05-28 NOTE — Assessment & Plan Note (Signed)
Well controlled HTN now Complication with CKD-III, CAD    Plan:  1. Continue current BP regimen - Losartan 100mg , Carvedilol 25mg  BID, Hydralazine 100mg  q 8 hr, isosorbide mononitrate imdur 90mg , Losartan 100mg  2. Encourage improved lifestyle - low sodium diet, regular exercise 3. Continue monitor BP outside office, bring readings to next visit, if persistently >140/90 or new symptoms notify office sooner 4. Follow-up 3 months

## 2018-05-28 NOTE — Assessment & Plan Note (Signed)
Dramatic worsening A1c after had improved to 9s, now back up to 11.9, SELF DISCONTINUED Victoza GLP1 due to side effect on top dose No hypoglycemia Complications - Hyperglycemia, CKD-III, peripheral neuropathy, DM retinopathy Meds - contraindicated Metformin with CKD / Failed Bydureon BCise x 2 trials (nausea)  Plan:  1. RESTART Victoza from 0.6 daily inj - then increase back up to middle dose of 1.2 - will AVOID 1.8 dose due to side effects dizzy/nausea, limited options due to CKD and insurance coverage - Continue Lantus 40u daily for now - once back up to 1.2 victoza may consider adjusting further 2. Encourage improved lifestyle - low carb, low sugar diet, reduce portion size, continue improving regular exercise 3. Check CBG, bring log to next visit for review 4. Continue ASA, ARB, Statin - restart statin again today - sent refill 5. DM Foot exam - shows concerning ulceration R Great toe w/ neuropathy - gradual healing. No sign of infection 6.  Again advised to f/u with DM ophtho exam at [facility], send record - He missed last apt in 04/2018, now has re-scheduled soonest for JANUARY 2020 7. Follow-up 3 months DM A1c  If not improved A1c back on GLP1 victoza, strongly consider referral to Endocrinology for additional assistance.  Also offered Podiatry for his DM foot ulcer for monitoring and debridement, he has declined

## 2018-05-28 NOTE — Assessment & Plan Note (Signed)
Controlled LDL, otherwise low HDL and high TG Last lipid panel 03/2018 Calculated ASCVD 10 yr risk score elevated with DM, CAD  Plan: 1. RESTART Atorvastatin 40mg  daily (refilled) 2. Continue Plavix, ASA 81mg  for secondary ASCVD risk reduction 3. Encourage improved lifestyle - low carb/cholesterol, reduce portion size, continue improving regular exercise

## 2018-05-28 NOTE — Patient Instructions (Addendum)
Thank you for coming to the office today.  Elevated A1c sugar up to 11.9  RESTART Victoza at low dose 0.6mg  daily injection, then after 1-2 weeks DOUBLE to 1.2 mg injection DAILY.  We cannot use the top dose anymore due to your side effects. Stay at 1.2mg  dose.  Restart Atorvastatin 40mg  nightly, sent rx to pharmacy.  Please schedule a Follow-up Appointment to: Return in about 3 months (around 08/28/2018) for DM A1c.  If you have any other questions or concerns, please feel free to call the office or send a message through [place]. You may also schedule an earlier appointment if necessary.  Additionally, you may be receiving a survey about your experience at our office within a few days to 1 week by e-mail or mail. We value your feedback.  [name], DO [place]

## 2018-05-28 NOTE — Progress Notes (Signed)
Subjective:    Patient ID: [name], male    DOB: 1961-04-19, 57 y.o.   MRN: [id number]  [name] is a 57 y.o. male presenting on 05/28/2018 for Diabetes and Hypertension   HPI  Specialists Cardiology - Dr Fletcher Anon [facility]) Nephrology - Dr Holley Raring ([facility])  CHRONIC DM, Type 2 with DM Neuropathy/ CKD-III Last visit 02/2018 for DM he had improved A1c from 10.1 to 9.4, and he was increased on Victoza to top dose 1.8 - Interval update - he self discontinued Victoza 1.8 daily after about 1 month of use, stopped 03/2018 due to nausea and dizziness side effect CBG: Avg 150, Low >100, High < 200. Checks fasting AM most days Meds: - Victoza 1.8mg  inj daily (CURRENTLY OFF for 2 months) - Lantus 40udaily - OFF Metformin due to CKD. Failed Bydureon in past due to nausea Reports good compliance. Tolerating well w/o side-effects Currently on ARB Lifestyle: - Diet (Still slowly improving DM diet) - Exercise (Limited regular exericse) - History of DM retinopathy-Last seen by [place] in 2017 with DM retinopathy - he did not make his apt that was scheduled in October 2019, but he missed it and now he re-scheduled for January 2020 -Admits intermittent burning, tingling at times in feet, worse at night - Admits R Toe Ulceration, for few weeks, has been healing, he did not come in for this problem sooner, denies any redness, drainage of pus, pain - he states triggered by blister after walking Denies hypoglycemia, polyuria, visual changes.  CHRONIC HTN: Reports no new concerns. Current Meds - Losartan 100mg  daily, Hydralazine 100mg  TID, Carvedilol 25mg  BID, Imdur 90mg  daily   Reports good compliance, took meds today. Tolerating well, w/o complaints. Denies CP, dyspnea, HA, edema, dizziness / lightheadedness  HYPERLIPIDEMIA: - Reports he has not restarted Statin - Previously taking Atorvastatin 40mg  daily, he was unsure if side effect from it. - Also on anti  platelet, Plavix, ASA Followed by Cardiology  Health Maintenance: Due for Flu Shot and Pneumonia vaccine in DM, declines today despite counseling on benefits   Depression screen [facility] 2/9 02/23/2018 08/22/2017 05/15/2017  Decreased Interest 0 0 0  Down, Depressed, Hopeless 0 0 0  PHQ - 2 Score 0 0 0    Social History   Tobacco Use  . Smoking status: Passive Smoke Exposure - Never Smoker  . Smokeless tobacco: Never Used  Substance Use Topics  . Alcohol use: Yes  . Drug use: No    Review of Systems Per HPI unless specifically indicated above     Objective:    BP 120/70 (BP Location: Right Arm, Patient Position: Sitting, Cuff Size: Normal)   Pulse 64   Resp 15   Ht 5\' 7"  (1.702 m)   Wt 211 lb 3.2 oz (95.8 kg)   SpO2 96%   BMI 33.08 kg/m   Wt Readings from Last 3 Encounters:  05/28/18 211 lb 3.2 oz (95.8 kg)  03/17/18 212 lb 8 oz (96.4 kg)  02/23/18 211 lb (95.7 kg)    Physical Exam  Constitutional: He is oriented to person, place, and time. He appears well-developed and well-nourished. No distress.  Well-appearing, comfortable, cooperative, obese  HENT:  Head: Normocephalic and atraumatic.  Mouth/Throat: Oropharynx is clear and moist.  Eyes: Conjunctivae are normal. Right eye exhibits no discharge. Left eye exhibits no discharge.  Neck: Normal range of motion. Neck supple. No thyromegaly present.  Cardiovascular: Normal rate, regular rhythm, normal heart sounds and intact  distal pulses.  No murmur heard. Pulmonary/Chest: Effort normal and breath sounds normal. No respiratory distress. He has no wheezes. He has no rales.  Musculoskeletal: Normal range of motion. He exhibits no edema.  Lymphadenopathy:    He has no cervical adenopathy.  Neurological: He is alert and oriented to person, place, and time.  Skin: Skin is warm and dry. No rash noted. He is not diaphoretic. No erythema.  Psychiatric: He has a normal mood and affect. His behavior is normal.  Well groomed,  good eye contact, normal speech and thoughts  Nursing note and vitals reviewed.    Diabetic Foot Exam - Simple   Simple Foot Form Diabetic Foot exam was performed with the following findings:  Yes 05/28/2018  8:38 AM  Visual Inspection See comments:  Yes Sensation Testing See comments:  Yes Pulse Check Posterior Tibialis and Dorsalis pulse intact bilaterally:  Yes Comments Right foot reduced monofilament sensation toes, plantar R great toe with large 2 x 2 approx cm ulceration, with callus and healing tissue now, not open or draining, non tender. Significant callus formation bilateral feet and heels, dry flaking skin.     Recent Labs    11/21/17 0955 02/23/18 0837 05/28/18 0831  HGBA1C 10.1* 9.4* 11.9*    Results for orders placed or performed in visit on 05/28/18  POCT glycosylated hemoglobin (Hb A1C)  Result Value Ref Range   Hemoglobin A1C 11.9 (A) 4.0 - 5.6 %      Assessment & Plan:   Problem List Items Addressed This Visit    Chronic kidney disease (CKD), stage III (moderate) ([place])    Persistent CKD-III Followed by Nephrology [facility] On ARB Avoid metformin Caution excessive fluid / sodium intake Follow-up with Renal      DM type 2 with diabetic peripheral neuropathy ([place]) - Primary    Dramatic worsening A1c after had improved to 9s, now back up to 11.9, SELF DISCONTINUED Victoza GLP1 due to side effect on top dose No hypoglycemia Complications - Hyperglycemia, CKD-III, peripheral neuropathy, DM retinopathy Meds - contraindicated Metformin with CKD / Failed Bydureon BCise x 2 trials (nausea)  Plan:  1. RESTART Victoza from 0.6 daily inj - then increase back up to middle dose of 1.2 - will AVOID 1.8 dose due to side effects dizzy/nausea, limited options due to CKD and insurance coverage - Continue Lantus 40u daily for now - once back up to 1.2 victoza may consider adjusting further 2. Encourage improved lifestyle - low carb, low sugar diet, reduce portion size,  continue improving regular exercise 3. Check CBG, bring log to next visit for review 4. Continue ASA, ARB, Statin - restart statin again today - sent refill 5. DM Foot exam - shows concerning ulceration R Great toe w/ neuropathy - gradual healing. No sign of infection 6.  Again advised to f/u with DM ophtho exam at [facility], send record - He missed last apt in 04/2018, now has re-scheduled soonest for JANUARY 2020 7. Follow-up 3 months DM A1c  If not improved A1c back on GLP1 victoza, strongly consider referral to Endocrinology for additional assistance.  Also offered Podiatry for his DM foot ulcer for monitoring and debridement, he has declined      Relevant Medications   liraglutide (VICTOZA) 18 MG/3ML SOPN   atorvastatin (LIPITOR) 40 MG tablet   Other Relevant Orders   POCT glycosylated hemoglobin (Hb A1C) (Completed)   Hyperlipidemia associated with type 2 diabetes mellitus (HCC)    Controlled LDL, otherwise low  HDL and high TG Last lipid panel 03/2018 Calculated ASCVD 10 yr risk score elevated with DM, CAD  Plan: 1. RESTART Atorvastatin 40mg  daily (refilled) 2. Continue Plavix, ASA 81mg  for secondary ASCVD risk reduction 3. Encourage improved lifestyle - low carb/cholesterol, reduce portion size, continue improving regular exercise      Relevant Medications   liraglutide (VICTOZA) 18 MG/3ML SOPN   atorvastatin (LIPITOR) 40 MG tablet   Resistant hypertension    Well controlled HTN now Complication with CKD-III, CAD    Plan:  1. Continue current BP regimen - Losartan 100mg , Carvedilol 25mg  BID, Hydralazine 100mg  q 8 hr, isosorbide mononitrate imdur 90mg , Losartan 100mg  2. Encourage improved lifestyle - low sodium diet, regular exercise 3. Continue monitor BP outside office, bring readings to next visit, if persistently >140/90 or new symptoms notify office sooner 4. Follow-up 3 months      Relevant Medications   atorvastatin (LIPITOR) 40 MG tablet      Meds  ordered this encounter  Medications  . atorvastatin (LIPITOR) 40 MG tablet    Sig: Take 1 tablet (40 mg total) by mouth at bedtime.    Dispense:  90 tablet    Refill:  3     Follow up plan: Return in about 3 months (around 08/28/2018) for DM A1c.  [name], DO [facility] 05/28/2018, 9:05 AM

## 2018-05-28 NOTE — Assessment & Plan Note (Signed)
Persistent CKD-III Followed by Nephrology [facility] On ARB Avoid metformin Caution excessive fluid / sodium intake Follow-up with Renal

## 2018-06-07 ENCOUNTER — Other Ambulatory Visit: Payer: Self-pay | Admitting: Physician Assistant

## 2018-06-07 ENCOUNTER — Other Ambulatory Visit: Payer: Self-pay | Admitting: Family Medicine

## 2018-06-07 DIAGNOSIS — E1142 Type 2 diabetes mellitus with diabetic polyneuropathy: Secondary | ICD-10-CM

## 2018-06-13 ENCOUNTER — Other Ambulatory Visit: Payer: Self-pay | Admitting: Family Medicine

## 2018-06-13 DIAGNOSIS — E1142 Type 2 diabetes mellitus with diabetic polyneuropathy: Secondary | ICD-10-CM

## 2018-06-27 ENCOUNTER — Other Ambulatory Visit: Payer: Self-pay | Admitting: Physician Assistant

## 2018-07-16 ENCOUNTER — Emergency Department
Admission: EM | Admit: 2018-07-16 | Discharge: 2018-07-17 | Disposition: A | Discharge status: Home / Self Care | Payer: Medicaid Other | Attending: Emergency Medicine | Admitting: Emergency Medicine

## 2018-07-16 ENCOUNTER — Other Ambulatory Visit: Payer: Self-pay

## 2018-07-16 ENCOUNTER — Encounter: Payer: Self-pay | Admitting: Emergency Medicine

## 2018-07-16 ENCOUNTER — Emergency Department: Payer: Medicaid Other

## 2018-07-16 DIAGNOSIS — E1122 Type 2 diabetes mellitus with diabetic chronic kidney disease: Secondary | ICD-10-CM | POA: Insufficient documentation

## 2018-07-16 DIAGNOSIS — M7989 Other specified soft tissue disorders: Secondary | ICD-10-CM | POA: Diagnosis not present

## 2018-07-16 DIAGNOSIS — E1165 Type 2 diabetes mellitus with hyperglycemia: Secondary | ICD-10-CM | POA: Diagnosis not present

## 2018-07-16 DIAGNOSIS — L97519 Non-pressure chronic ulcer of other part of right foot with unspecified severity: Secondary | ICD-10-CM

## 2018-07-16 DIAGNOSIS — M79674 Pain in right toe(s): Secondary | ICD-10-CM | POA: Diagnosis not present

## 2018-07-16 DIAGNOSIS — L97509 Non-pressure chronic ulcer of other part of unspecified foot with unspecified severity: Secondary | ICD-10-CM | POA: Insufficient documentation

## 2018-07-16 DIAGNOSIS — E11621 Type 2 diabetes mellitus with foot ulcer: Secondary | ICD-10-CM | POA: Diagnosis not present

## 2018-07-16 DIAGNOSIS — N183 Chronic kidney disease, stage 3 (moderate): Secondary | ICD-10-CM | POA: Insufficient documentation

## 2018-07-16 DIAGNOSIS — Z7722 Contact with and (suspected) exposure to environmental tobacco smoke (acute) (chronic): Secondary | ICD-10-CM | POA: Diagnosis not present

## 2018-07-16 DIAGNOSIS — R739 Hyperglycemia, unspecified: Secondary | ICD-10-CM

## 2018-07-16 LAB — CBC WITH DIFFERENTIAL/PLATELET
ABS IMMATURE GRANULOCYTES: 0.02 10*3/uL (ref 0.00–0.07)
BASOS PCT: 0 %
Basophils Absolute: 0 10*3/uL (ref 0.0–0.1)
EOS ABS: 0.1 10*3/uL (ref 0.0–0.5)
Eosinophils Relative: 1 %
HCT: 34 % — ABNORMAL LOW (ref 39.0–52.0)
Hemoglobin: 12 g/dL — ABNORMAL LOW (ref 13.0–17.0)
Immature Granulocytes: 0 %
Lymphocytes Relative: 25 %
Lymphs Abs: 1.6 10*3/uL (ref 0.7–4.0)
MCH: 32.4 pg (ref 26.0–34.0)
MCHC: 35.3 g/dL (ref 30.0–36.0)
MCV: 91.9 fL (ref 80.0–100.0)
MONO ABS: 0.8 10*3/uL (ref 0.1–1.0)
Monocytes Relative: 12 %
NEUTROS ABS: 3.9 10*3/uL (ref 1.7–7.7)
Neutrophils Relative %: 62 %
PLATELETS: 227 10*3/uL (ref 150–400)
RBC: 3.7 MIL/uL — AB (ref 4.22–5.81)
RDW: 11.6 % (ref 11.5–15.5)
WBC: 6.3 10*3/uL (ref 4.0–10.5)
nRBC: 0 % (ref 0.0–0.2)

## 2018-07-16 LAB — COMPREHENSIVE METABOLIC PANEL
ALT: 16 U/L (ref 0–44)
AST: 16 U/L (ref 15–41)
Albumin: 3.2 g/dL — ABNORMAL LOW (ref 3.5–5.0)
Alkaline Phosphatase: 73 U/L (ref 38–126)
Anion gap: 7 (ref 5–15)
BILIRUBIN TOTAL: 0.7 mg/dL (ref 0.3–1.2)
BUN: 38 mg/dL — AB (ref 6–20)
CO2: 23 mmol/L (ref 22–32)
Calcium: 8.4 mg/dL — ABNORMAL LOW (ref 8.9–10.3)
Chloride: 97 mmol/L — ABNORMAL LOW (ref 98–111)
Creatinine, Ser: 1.87 mg/dL — ABNORMAL HIGH (ref 0.61–1.24)
GFR calc Af Amer: 45 mL/min — ABNORMAL LOW (ref >60)
GFR, EST NON AFRICAN AMERICAN: 39 mL/min — AB (ref >60)
Glucose, Bld: 471 mg/dL — ABNORMAL HIGH (ref 70–99)
POTASSIUM: 4.4 mmol/L (ref 3.5–5.1)
Sodium: 127 mmol/L — ABNORMAL LOW (ref 135–145)
TOTAL PROTEIN: 6.8 g/dL (ref 6.5–8.1)

## 2018-07-16 LAB — URINALYSIS, COMPLETE (UACMP) WITH MICROSCOPIC
Bacteria, UA: NONE SEEN
Bilirubin Urine: NEGATIVE
HGB URINE DIPSTICK: NEGATIVE
Ketones, ur: NEGATIVE mg/dL
Nitrite: NEGATIVE
PH: 6 (ref 5.0–8.0)
Protein, ur: 100 mg/dL — AB
SPECIFIC GRAVITY, URINE: 1.018 (ref 1.005–1.030)

## 2018-07-16 LAB — GLUCOSE, CAPILLARY: Glucose-Capillary: 376 mg/dL — ABNORMAL HIGH (ref 70–99)

## 2018-07-16 MED ORDER — TRAMADOL HCL 50 MG PO TABS: 50.0000 mg | ORAL_TABLET | Freq: Four times a day (QID) | ORAL | 0 refills | Status: AC | PRN

## 2018-07-16 MED ORDER — INSULIN ASPART 100 UNIT/ML ~~LOC~~ SOLN
5.0000 [IU] | Freq: Once | SUBCUTANEOUS | Status: AC
  Administered 2018-07-16: 5 [IU] via SUBCUTANEOUS
  Filled 2018-07-16: qty 1

## 2018-07-16 MED ORDER — SODIUM CHLORIDE 0.9 % IV SOLN
Freq: Once | INTRAVENOUS | Status: AC
  Administered 2018-07-16: 10:00:00 via INTRAVENOUS

## 2018-07-16 MED ORDER — CEPHALEXIN 500 MG PO CAPS: 500.0000 mg | ORAL_CAPSULE | Freq: Four times a day (QID) | ORAL | 0 refills | Status: AC

## 2018-07-16 NOTE — ED Notes (Signed)
MD at bedside to update pt and family

## 2018-07-16 NOTE — ED Provider Notes (Signed)
[facility] Emergency Department Provider Note       Time seen: ----------------------------------------- 9:20 AM on 07/16/2018 -----------------------------------------   I have reviewed the triage vital signs and the nursing notes.  HISTORY   Chief Complaint Toe Pain    HPI [name] is a 58 y.o. male with a history of heart failure, chronic kidney disease, coronary disease, diabetes, hyperlipidemia, hypertension who presents to the ED for right great toe pain for several months.  Patient has noted swelling and redness around the nail and under the toe.  He states he saw his doctor in November but did not actually go back to get rechecked.  Patient thinks his blood sugar is probably around 130.  Past Medical History:  Diagnosis Date  . Ankle pain   . Chronic combined systolic (congestive) and diastolic (congestive) heart failure ([place]) 10/2016   a. 10/2016 Echo: EF 25-30%, diff HK, mild MR; b. 01/2017 Echo:  EF 40-45%, moderate LVH, Gr1DD, calcified mitral annulus, mildly dilated left atrium, RV cavity size was normal with normal RVSF; c. 08/2017 Echo: EF 40-45%, sept, antsept HK, Gr1DD, Mild MR, mildly dil LA. Nl RV fxn.  . CKD (chronic kidney disease), stage III ([place])   . Coronary artery disease    a. 10/2016 Cath/PCI: LAD 62m (3.5x15 Xience [place] DES). No other obstructive disease.  . Diabetic neuropathy ([place])   . Hyperlipidemia   . Hypertension   . 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 10/2016 Echo: EF 25-30%, diff HK; b. 01/2017 Echo: EF 40-45%; c. 08/2017 Echo: EF 40-45%.  . Pain in both feet     Patient Active Problem List   Diagnosis Date Noted  . Hypokalemia 10/19/2016  . Chronic systolic heart failure ([place]) 10/13/2016  . Hyperlipidemia associated with type 2 diabetes mellitus ([place]) 01/22/2016  . Chronic kidney disease (CKD), stage III (moderate) ([place]) 06/15/2015  . DM type 2 with diabetic peripheral neuropathy  ([place]) 08/27/2013  . AA (alcohol abuse) 05/18/2012  . Osteoarthritis of ankle and foot 05/18/2012  . Bursitis of elbow 01/14/2012  . Resistant hypertension 09/20/2007    Past Surgical History:  Procedure Laterality Date  . CORONARY STENT INTERVENTION N/A 10/21/2016   Procedure: Coronary Stent Intervention;  Surgeon: Wellington Hampshire, MD;  Location: [place] CV LAB;  Service: Cardiovascular;  Laterality: N/A;  . LEFT HEART CATH AND CORONARY ANGIOGRAPHY N/A 10/21/2016   Procedure: Left Heart Cath and Coronary Angiography;  Surgeon: Wellington Hampshire, MD;  Location: [place] CV LAB;  Service: Cardiovascular;  Laterality: N/A;  . TIBIA FRACTURE SURGERY Right 2002    Allergies Lisinopril and Terbinafine and related  Social History Social History   Tobacco Use  . Smoking status: Passive Smoke Exposure - Never Smoker  . Smokeless tobacco: Never Used  Substance Use Topics  . Alcohol use: Yes  . Drug use: No   Review of Systems Constitutional: Negative for fever. Cardiovascular: Negative for chest pain. Respiratory: Negative for shortness of breath. Gastrointestinal: Negative for abdominal pain, vomiting and diarrhea. Musculoskeletal: Positive for right leg pain, right great toe pain Skin: Right great toe ulceration Neurological: Negative for headaches, focal weakness or numbness.  All systems negative/normal/unremarkable except as stated in the HPI  ____________________________________________   PHYSICAL EXAM:  VITAL SIGNS: ED Triage Vitals  Enc Vitals Group     BP 07/16/18 0816 (!) 117/45     Pulse Rate 07/16/18 0816 66     Resp 07/16/18  0816 18     Temp 07/16/18 0816 98.4 F (36.9 C)     Temp Source 07/16/18 0816 Oral     SpO2 07/16/18 0816 97 %     Weight 07/16/18 0816 210 lb (95.3 kg)     Height 07/16/18 0816 5\' 8"  (1.727 m)     Head Circumference --      Peak Flow --      Pain Score 07/16/18 0823 9     Pain Loc --      Pain Edu? --      Excl. in [place]? --     Constitutional: Alert and oriented. Well appearing and in no distress. ENT      Head: Normocephalic and atraumatic.      Nose: No congestion/rhinnorhea.      Mouth/Throat: Mucous membranes are moist.      Neck: No stridor. Cardiovascular: Normal rate, regular rhythm. No murmurs, rubs, or gallops.  Normal pulses in the right lower extremity Respiratory: Normal respiratory effort without tachypnea nor retractions. Breath sounds are clear and equal bilaterally. No wheezes/rales/rhonchi. Gastrointestinal: Soft and nontender. Normal bowel sounds Musculoskeletal: Nontender with normal range of motion in extremities. No lower extremity tenderness nor edema. Neurologic:  Normal speech and language. No gross focal neurologic deficits are appreciated.  Skin: Ulceration beneath the plantar surface of the right great toe distally, mild erythema on the right great toe.  Right great toenail has been removed. Psychiatric: Mood and affect are normal. Speech and behavior are normal.  ____________________________________________  ED COURSE:  As part of my medical decision making, I reviewed the following data within the [place] History obtained from family if available, nursing notes, old chart and ekg, as well as notes from prior ED visits. Patient presented for a diabetic foot ulcer, we will assess with labs and imaging as indicated at this time.   Procedures ____________________________________________   LABS (pertinent positives/negatives)  Labs Reviewed  COMPREHENSIVE METABOLIC PANEL - Abnormal; Notable for the following components:      Result Value   Sodium 127 (*)    Chloride 97 (*)    Glucose, Bld 471 (*)    BUN 38 (*)    Creatinine, Ser 1.87 (*)    Calcium 8.4 (*)    Albumin 3.2 (*)    GFR calc non Af Amer 39 (*)    GFR calc Af Amer 45 (*)    All other components within normal limits  CBC WITH DIFFERENTIAL/PLATELET - Abnormal; Notable for the following  components:   RBC 3.70 (*)    Hemoglobin 12.0 (*)    HCT 34.0 (*)    All other components within normal limits  URINALYSIS, COMPLETE (UACMP) WITH MICROSCOPIC - Abnormal; Notable for the following components:   Color, Urine YELLOW (*)    APPearance CLEAR (*)    Glucose, UA >=500 (*)    Protein, ur 100 (*)    Leukocytes, UA TRACE (*)    All other components within normal limits    RADIOLOGY Images were viewed by me  Right great toe x-ray IMPRESSION: No osteomyelitis of the right great toe. ____________________________________________   DIFFERENTIAL DIAGNOSIS   Diabetic ulceration, cellulitis, osteomyelitis, dehydration, hyperglycemia  FINAL ASSESSMENT AND PLAN  Diabetic foot ulceration, hyperglycemia   Plan: The patient had presented for diabetic toe ulceration and hyperglycemia. Patient's labs reveal hyperglycemia which he attributes to poor diet changes recently and alcohol intake. Patient's imaging was negative for osteomyelitis.  Patient does have  significant ulceration on the plantar surface of his right great toe.  I will refer him to the wound clinic and prescribed Keflex.   Laurence Aly, MD    Note: This note was generated in part or whole with voice recognition software. Voice recognition is usually quite accurate but there are transcription errors that can and very often do occur. I apologize for any typographical errors that were not detected and corrected.     Earleen Newport, MD 07/16/18 1046

## 2018-07-16 NOTE — ED Notes (Signed)
X-ray at bedside

## 2018-07-16 NOTE — ED Triage Notes (Signed)
Right first toe pain for couple months.  Swelling, red and pus around nail and under toe.  He is diabetic. Says he saw his doctor in November, but he did not go back.

## 2018-07-17 NOTE — ED Notes (Signed)
Pt in NAD at time of discharge. Delay in documenting discharge but pt left [facility] ED with wife at 29 on 07/16/18.   5/10 pain at time of discharge with pain medication prescription.

## 2018-07-20 ENCOUNTER — Encounter: Payer: Medicaid Other | Attending: Physician Assistant | Admitting: Physician Assistant

## 2018-07-20 ENCOUNTER — Other Ambulatory Visit
Admission: RE | Admit: 2018-07-20 | Discharge: 2018-07-20 | Disposition: A | Discharge status: Home / Self Care | Payer: Medicaid Other | Source: Ambulatory Visit | Attending: Physician Assistant | Admitting: Physician Assistant

## 2018-07-20 DIAGNOSIS — E11621 Type 2 diabetes mellitus with foot ulcer: Secondary | ICD-10-CM | POA: Insufficient documentation

## 2018-07-20 DIAGNOSIS — Z881 Allergy status to other antibiotic agents status: Secondary | ICD-10-CM | POA: Diagnosis not present

## 2018-07-20 DIAGNOSIS — L97514 Non-pressure chronic ulcer of other part of right foot with necrosis of bone: Secondary | ICD-10-CM | POA: Insufficient documentation

## 2018-07-20 DIAGNOSIS — B999 Unspecified infectious disease: Secondary | ICD-10-CM | POA: Insufficient documentation

## 2018-07-20 DIAGNOSIS — M869 Osteomyelitis, unspecified: Secondary | ICD-10-CM | POA: Diagnosis not present

## 2018-07-21 NOTE — Progress Notes (Signed)
[name] ([id number]) Visit Report for 07/20/2018 Abuse/Suicide Risk Screen Details Patient Name: [name], TILL. Date of Service: 07/20/2018 10:30 AM Medical Record Number: [id number] Patient Account Number: [id number] Date of Birth/Sex: [birth year] (58 y.o. M) Treating RN: Secundino Ginger Primary Care Oryon Gary: [name] Other Clinician: Referring Jaxton Casale: Lenise Arena Treating Kambri Dismore/Extender: Melburn Hake, HOYT Weeks in Treatment: 0 Abuse/Suicide Risk Screen Items Answer ABUSE/SUICIDE RISK SCREEN: Has anyone close to you tried to hurt or harm you recentlyo No Do you feel uncomfortable with anyone in your familyo No Has anyone forced you do things that you didnot want to doo No Do you have any thoughts of harming yourselfo No Patient displays signs or symptoms of abuse and/or neglect. No Electronic Signature(s) Signed: 07/20/2018 4:23:06 PM By: Secundino Ginger Entered By: Secundino Ginger on 07/20/2018 10:48:38 [name] ([id number]) -------------------------------------------------------------------------------- Activities of Daily Living Details Patient Name: [name]. Date of Service: 07/20/2018 10:30 AM Medical Record Number: [id number] Patient Account Number: [id number] Date of Birth/Sex: [birth year] (58 y.o. M) Treating RN: Secundino Ginger Primary Care Shareef Eddinger: [name] Other Clinician: Referring Tyia Binford: Lenise Arena Treating Timberlyn Pickford/Extender: Melburn Hake, HOYT Weeks in Treatment: 0 Activities of Daily Living Items Answer Activities of Daily Living (Please select one for each item) Drive Automobile Completely Able Take Medications Completely Able Use Telephone Completely Able Care for Appearance Completely Able Use Toilet Completely Able Bath / Shower Completely Able Dress Self Completely Able Feed Self Completely Able Walk Completely Able Get In / Out Bed Completely Able Housework Completely Able Prepare Meals Completely  [place] for Self Completely Able Electronic Signature(s) Signed: 07/20/2018 4:23:06 PM By: Secundino Ginger Entered By: Secundino Ginger on 07/20/2018 10:49:34 [name] ([id number]) -------------------------------------------------------------------------------- Education Assessment Details Patient Name: [name]. Date of Service: 07/20/2018 10:30 AM Medical Record Number: [id number] Patient Account Number: [id number] Date of Birth/Sex: March 17, 1961 (58 y.o. M) Treating RN: Secundino Ginger Primary Care Akio Hudnall: [name] Other Clinician: Referring Adean Milosevic: Lenise Arena Treating Aliyah Abeyta/Extender: Melburn Hake, HOYT Weeks in Treatment: 0 Learning Preferences/Education Level/Primary Language Learning Preference: Explanation, Demonstration Highest Education Level: High School Preferred Language: English Cognitive Barrier Assessment/Beliefs Language Barrier: No Translator Needed: No Memory Deficit: No Emotional Barrier: No Cultural/Religious Beliefs Affecting Medical Care: No Physical Barrier Assessment Impaired Vision: No Impaired Hearing: No Decreased Hand dexterity: No Knowledge/Comprehension Assessment Knowledge Level: High Comprehension Level: High Ability to understand written High instructions: Ability to understand verbal High instructions: Motivation Assessment Anxiety Level: Calm Cooperation: Cooperative Education Importance: Acknowledges Need Interest in Health Problems: Asks Questions Perception: Coherent Willingness to Engage in Self- High Management Activities: Readiness to Engage in Self- High Management Activities: Electronic Signature(s) Signed: 07/20/2018 4:23:06 PM By: Secundino Ginger Entered By: Secundino Ginger on 07/20/2018 10:50:24 [name] ([id number]) -------------------------------------------------------------------------------- Fall Risk Assessment Details Patient Name: [name]. Date of  Service: 07/20/2018 10:30 AM Medical Record Number: [id number] Patient Account Number: [id number] Date of Birth/Sex: October 24, 1960 (58 y.o. M) Treating RN: Secundino Ginger Primary Care Marsa Matteo: [name] Other Clinician: Referring Leonda Cristo: Lenise Arena Treating Bernestine Holsapple/Extender: Melburn Hake, HOYT Weeks in Treatment: 0 Fall Risk Assessment Items Have you had 2 or more falls in the last 12 monthso 0 No Have you had any fall that resulted in injury in the last 12 monthso 0 No FALL RISK ASSESSMENT: History of falling - immediate or within 3 months 0 No Secondary diagnosis 0 No Ambulatory aid None/bed rest/wheelchair/nurse 0 No Crutches/cane/walker 0 No Furniture 0 No IV Access/Saline Lock  0 No Gait/Training Normal/bed rest/immobile 0 No Weak 0 No Impaired 0 No Mental Status Oriented to own ability 0 Yes Electronic Signature(s) Signed: 07/20/2018 4:23:06 PM By: Secundino Ginger Entered By: Secundino Ginger on 07/20/2018 10:51:03 [name] ([id number]) -------------------------------------------------------------------------------- Foot Assessment Details Patient Name: [name]. Date of Service: 07/20/2018 10:30 AM Medical Record Number: [id number] Patient Account Number: [id number] Date of Birth/Sex: Feb 15, 1961 (58 y.o. M) Treating RN: Secundino Ginger Primary Care Tannia Contino: [name] Other Clinician: Referring Emonte Dieujuste: Lenise Arena Treating Sarim Rothman/Extender: Melburn Hake, HOYT Weeks in Treatment: 0 Foot Assessment Items Site Locations + = Sensation present, - = Sensation absent, C = Callus, U = Ulcer R = Redness, W = Warmth, M = Maceration, PU = Pre-ulcerative lesion F = Fissure, S = Swelling, D = Dryness Assessment Right: Left: Other Deformity: No No Prior Foot Ulcer: No No Prior Amputation: No No Charcot Joint: No No Ambulatory Status: Gait: Electronic Signature(s) Signed: 07/20/2018 4:23:06 PM By: Secundino Ginger Entered By: Secundino Ginger on 07/20/2018  11:01:53 [name] ([id number]) -------------------------------------------------------------------------------- Nutrition Risk Assessment Details Patient Name: [name]. Date of Service: 07/20/2018 10:30 AM Medical Record Number: [id number] Patient Account Number: [id number] Date of Birth/Sex: Nov 20, 1960 (58 y.o. M) Treating RN: Secundino Ginger Primary Care Andrick Rust: [name] Other Clinician: Referring Saphia Vanderford: Lenise Arena Treating Cordel Drewes/Extender: Melburn Hake, HOYT Weeks in Treatment: 0 Height (in): 68 Weight (lbs): 210 Body Mass Index (BMI): 31.9 Nutrition Risk Assessment Items NUTRITION RISK SCREEN: I have an illness or condition that made me change the kind and/or amount of 0 No food I eat I eat fewer than two meals per day 0 No I eat few fruits and vegetables, or milk products 0 No I have three or more drinks of beer, liquor or wine almost every day 0 No I have tooth or mouth problems that make it hard for me to eat 0 No I don't always have enough money to buy the food I need 0 No I eat alone most of the time 0 No I take three or more different prescribed or over-the-counter drugs a day 0 No Without wanting to, I have lost or gained 10 pounds in the last six months 0 No I am not always physically able to shop, cook and/or feed myself 0 No Nutrition Protocols Good Risk Protocol Moderate Risk Protocol Electronic Signature(s) Signed: 07/20/2018 4:23:06 PM By: Secundino Ginger Entered BySecundino Ginger on 07/20/2018 10:51:07

## 2018-07-22 LAB — AEROBIC CULTURE  (SUPERFICIAL SPECIMEN)

## 2018-07-22 LAB — AEROBIC CULTURE W GRAM STAIN (SUPERFICIAL SPECIMEN)

## 2018-07-23 DIAGNOSIS — E113213 Type 2 diabetes mellitus with mild nonproliferative diabetic retinopathy with macular edema, bilateral: Secondary | ICD-10-CM | POA: Diagnosis not present

## 2018-07-23 LAB — HM DIABETES EYE EXAM

## 2018-07-24 ENCOUNTER — Other Ambulatory Visit: Payer: Self-pay

## 2018-07-24 ENCOUNTER — Other Ambulatory Visit: Payer: Self-pay | Admitting: Physician Assistant

## 2018-07-24 DIAGNOSIS — L97514 Non-pressure chronic ulcer of other part of right foot with necrosis of bone: Secondary | ICD-10-CM

## 2018-07-24 DIAGNOSIS — E1142 Type 2 diabetes mellitus with diabetic polyneuropathy: Secondary | ICD-10-CM

## 2018-07-24 MED ORDER — GLUCOSE BLOOD VI STRP: ORAL_STRIP | 12 refills | Status: DC

## 2018-07-24 MED ORDER — LANCETS MISC. MISC: 12 refills | Status: DC

## 2018-07-24 MED ORDER — BLOOD GLUCOSE MONITOR KIT: PACK | 0 refills | Status: DC

## 2018-07-24 NOTE — Progress Notes (Addendum)
[name], [name] ([id number]) Visit Report for 07/20/2018 Chief Complaint Document Details Patient Name: [name], [name]. Date of Service: 07/20/2018 10:30 AM Medical Record Number: [id number] Patient Account Number: [id number] Date of Birth/Sex: 03-19-1961 (58 y.o. M) Treating RN: Harold Barban Primary Care Provider: [name] Other Clinician: Referring Provider: Lenise Arena Treating Provider/Extender: Melburn Hake, HOYT Weeks in Treatment: 0 Information Obtained from: Patient Chief Complaint Right great toe ulcer Electronic Signature(s) Signed: 07/21/2018 7:11:38 AM By: Worthy Keeler PA-C Entered By: Worthy Keeler on 07/20/2018 11:16:52 [name] ([id number]) -------------------------------------------------------------------------------- Debridement Details Patient Name: [name]. Date of Service: 07/20/2018 10:30 AM Medical Record Number: [id number] Patient Account Number: [id number] Date of Birth/Sex: 09/13/1960 (58 y.o. M) Treating RN: Harold Barban Primary Care Provider: [name] Other Clinician: Referring Provider: Lenise Arena Treating Provider/Extender: STONE III, HOYT Weeks in Treatment: 0 Debridement Performed for Wound #1 Right,Plantar Toe Great Assessment: Performed By: Physician STONE III, HOYT E., PA-C Debridement Type: Debridement Severity of Tissue Pre Fat layer exposed Debridement: Level of Consciousness (Pre- Awake and Alert procedure): Pre-procedure Verification/Time Yes - 11:28 Out Taken: Start Time: 11:28 Pain Control: Lidocaine Total Area Debrided (L x W): 1.9 (cm) x 1.4 (cm) = 2.66 (cm) Tissue and other material Viable, Non-Viable, Bone, Callus, Slough, Subcutaneous, Tendon, Slough debrided: Level: Skin/Subcutaneous Tissue/Muscle/Bone Debridement Description: Excisional Instrument: Curette Bleeding: Moderate Hemostasis Achieved: Pressure Procedural Pain: 0 Post Procedural Pain:  0 Response to Treatment: Procedure was tolerated well Level of Consciousness Awake and Alert (Post-procedure): Post Debridement Measurements of Total Wound Length: (cm) 1.9 Width: (cm) 1.4 Depth: (cm) 0.4 Volume: (cm) 0.836 Character of Wound/Ulcer Post Debridement: Improved Severity of Tissue Post Debridement: Necrosis of bone Post Procedure Diagnosis Same as Pre-procedure Electronic Signature(s) Signed: 07/21/2018 7:11:38 AM By: Worthy Keeler PA-C Signed: 07/23/2018 2:41:13 PM By: Harold Barban Entered By: Worthy Keeler on 07/21/2018 05:49:16 [name] ([id number]) -------------------------------------------------------------------------------- HPI Details Patient Name: [name]. Date of Service: 07/20/2018 10:30 AM Medical Record Number: [id number] Patient Account Number: [id number] Date of Birth/Sex: 15-Jun-1961 (58 y.o. M) Treating RN: Harold Barban Primary Care Provider: [name] Other Clinician: Referring Provider: Lenise Arena Treating Provider/Extender: Melburn Hake, HOYT Weeks in Treatment: 0 History of Present Illness HPI Description: 07/20/17 on evaluation today patient actually appears to be doing somewhat poorly in regard to his right plantar great toe ulcer. This is something that he states started as what he thought to be a blister May 04, 2018. Subsequently he treated this on his own for a while thinking that it would just get better. As it did not get better he eventually did go to have this evaluated where it was treated appropriately with it sounds like Bactroban ointment however the patient states that it never completely resolved. Subsequently if this continue to get worse and was looking more irritated and inflamed he did go to the hospital more recently where he was prescribed Keflex. Subsequently he was also referred to the wound care center here for further evaluation by [place]. He had an x-ray in the ER which revealed  no evidence of osteomyelitis that I explained to the patient that osteomyelitis can be present even in a negative x-ray as it's not nearly as sensitive as an MRI would be. No fevers, chills, nausea, or vomiting noted at this time. The patient tells me that this might look a little bit better since he started the antibiotics. He does have a history of diabetes mellitus type II he also had  a horse accident years ago and had to have significant surgery in regard to his right lower extremity that did leave some deformity in this regard. This is mainly centered in the shin region. Electronic Signature(s) Signed: 07/21/2018 7:11:38 AM By: Worthy Keeler PA-C Entered By: Worthy Keeler on 07/21/2018 05:51:51 CONNY, MOENING ([id number]) -------------------------------------------------------------------------------- Physical Exam Details Patient Name: [name]. Date of Service: 07/20/2018 10:30 AM Medical Record Number: [id number] Patient Account Number: [id number] Date of Birth/Sex: 07-Jul-1961 (58 y.o. M) Treating RN: Harold Barban Primary Care Provider: [name] Other Clinician: Referring Provider: Lenise Arena Treating Provider/Extender: STONE III, HOYT Weeks in Treatment: 0 Constitutional sitting or standing blood pressure is within target range for patient.. pulse regular and within target range for patient.[name] respirations regular, non-labored and within target range for patient.[name] temperature within target range for patient.. Well- nourished and well-hydrated in no acute distress. Eyes conjunctiva clear no eyelid edema noted. pupils equal round and reactive to light and accommodation. Ears, Nose, Mouth, and Throat no gross abnormality of ear auricles or external auditory canals. normal hearing noted during conversation. mucus membranes moist. Respiratory normal breathing without difficulty. clear to auscultation bilaterally. Cardiovascular regular rate and  rhythm with normal S1, S2. 1+ dorsalis pedis/posterior tibialis pulses of the right LE. no clubbing, cyanosis, significant edema, <3 sec cap refill. Gastrointestinal (GI) soft, non-tender, non-distended, +BS. no ventral hernia noted. Musculoskeletal normal gait and posture. Surgical/injury changes of the right LE noted in the lower portion of his leg due to a prior horse injury.[name] Psychiatric this patient is able to make decisions and demonstrates good insight into disease process. Alert and Oriented x 3. pleasant and cooperative. Notes Upon evaluation today patient's wound actually appears to have a significant amount of necrotic tissue noted in the surface of the wound bed. Subsequently I explained that sharp debridement would be necessary and proceeded to go forth with sharp debridement. However as as it was debrided it became obvious that was actually necrotic bone in the base of the wound which subsequently was debrided away in a sample was obtained for both pathology and culture today. 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 Electronic Signature(s) Signed: 07/21/2018 7:11:38 AM By: Worthy Keeler PA-C Entered By: Worthy Keeler on 07/21/2018 05:53:31 [name] ([id number]) -------------------------------------------------------------------------------- Physician Orders Details Patient Name: MERTON, WADLOW. Date of Service: 07/20/2018 10:30 AM Medical Record Number: [id number] Patient Account Number: [id number] Date of Birth/Sex: Feb 01, 1961 (58 y.o. M) Treating RN: Harold Barban Primary Care Provider: [name] Other Clinician: Referring Provider: Lenise Arena Treating Provider/Extender: Melburn Hake, HOYT Weeks in Treatment: 0 Verbal / Phone Orders: No Diagnosis Coding ICD-10 Coding Code Description E11.621 Type 2 diabetes mellitus with foot ulcer L97.512 Non-pressure chronic ulcer of  other part of right foot with fat layer exposed I73.89 Other specified peripheral vascular diseases Wound Cleansing Wound #1 Right,Plantar Toe Great o May Shower, gently pat wound dry prior to applying new dressing. - Dial Antibacterial soap Primary Wound Dressing o Silver Alginate Secondary Dressing o Conform/Kerlix Dressing Change Frequency Wound #1 Right,Plantar Toe Great o Change dressing every other day. Follow-up Appointments Wound #1 Right,Plantar Toe Great o Return Appointment in 1 week. Off-Loading Wound #1 Right,Plantar Toe Great o Open toe surgical shoe with peg assist. Consults o Infectious Disease - [facility] Laboratory o Bacteria identified in Wound by Culture (MICRO) oooo LOINC Code: 5732-2 oooo Convenience Name: Wound culture routine o  Tissue Pathology biopsy report (PATH) oooo LOINC Code: [phone] oooo Convenience Name: Tiss Path Bx report KAELAN, AMBLE ([id number]) Radiology o Magnetic Resonance Imaging (MRI) Patient Medications Allergies: No Known Allergies Notifications Medication Indication Start End Cipro 07/23/2018 DOSE 1 - oral 500 mg tablet - 1 tablet oral taken 2 times a day for 14 days or until follow-up with infectious disease Electronic Signature(s) Signed: 07/23/2018 3:15:02 PM By: Worthy Keeler PA-C Previous Signature: 07/21/2018 7:11:38 AM Version By: Worthy Keeler PA-C Previous Signature: 07/23/2018 2:41:13 PM Version By: Harold Barban Entered By: Worthy Keeler on 07/23/2018 15:15:01 [name] ([id number]) -------------------------------------------------------------------------------- Problem List Details Patient Name: [name]. Date of Service: 07/20/2018 10:30 AM Medical Record Number: [id number] Patient Account Number: [id number] Date of Birth/Sex: 04/04/1961 (57 y.o. M) Treating RN: Harold Barban Primary Care Provider: [name] Other Clinician: Referring Provider: Lenise Arena Treating Provider/Extender: Melburn Hake, HOYT Weeks in Treatment: 0 Active Problems ICD-10 Evaluated Encounter Code Description Active Date Today Diagnosis E11.621 Type 2 diabetes mellitus with foot ulcer 07/20/2018 No Yes L97.514 Non-pressure chronic ulcer of other part of right foot with 07/20/2018 No Yes necrosis of bone I73.89 Other specified peripheral vascular diseases 07/20/2018 No Yes Inactive Problems Resolved Problems Electronic Signature(s) Signed: 07/21/2018 7:11:38 AM By: Worthy Keeler PA-C Entered By: Worthy Keeler on 07/21/2018 05:48:50 [name] ([id number]) -------------------------------------------------------------------------------- Progress Note Details Patient Name: [name]. Date of Service: 07/20/2018 10:30 AM Medical Record Number: [id number] Patient Account Number: [id number] Date of Birth/Sex: 1961/01/12 (58 y.o. M) Treating RN: Harold Barban Primary Care Provider: [name] Other Clinician: Referring Provider: Lenise Arena Treating Provider/Extender: Melburn Hake, HOYT Weeks in Treatment: 0 Subjective Chief Complaint Information obtained from Patient Right great toe ulcer History of Present Illness (HPI) 07/20/17 on evaluation today patient actually appears to be doing somewhat poorly in regard to his right plantar great toe ulcer. This is something that he states started as what he thought to be a blister May 04, 2018. Subsequently he treated this on his own for a while thinking that it would just get better. As it did not get better he eventually did go to have this evaluated where it was treated appropriately with it sounds like Bactroban ointment however the patient states that it never completely resolved. Subsequently if this continue to get worse and was looking more irritated and inflamed he did go to the hospital more recently where he was prescribed Keflex. Subsequently he was also referred to the wound  care center here for further evaluation by [place]. He had an x-ray in the ER which revealed no evidence of osteomyelitis that I explained to the patient that osteomyelitis can be present even in a negative x-ray as it's not nearly as sensitive as an MRI would be. No fevers, chills, nausea, or vomiting noted at this time. The patient tells me that this might look a little bit better since he started the antibiotics. He does have a history of diabetes mellitus type II he also had a horse accident years ago and had to have significant surgery in regard to his right lower extremity that did leave some deformity in this regard. This is mainly centered in the shin region. Wound History Patient reportedly has not tested positive for osteomyelitis. Patient reportedly has not had testing performed to evaluate circulation in the legs. Patient experiences the following problems associated with their wounds: swelling. Patient History Information obtained from Patient. Allergies No Known Allergies Family History  Diabetes - Maternal Grandparents, Heart Disease - Mother, No family history of Cancer, Hereditary Spherocytosis, Hypertension, Kidney Disease, Lung Disease, Seizures, Stroke, Thyroid Problems, Tuberculosis. Social History Never smoker, Marital Status - Married, Alcohol Use - Moderate, Drug Use - No History, Caffeine Use - Daily. Medical History Hematologic/Lymphatic Denies history of Anemia, Hemophilia, Human Immunodeficiency Virus, Lymphedema, Sickle Cell Disease Respiratory Denies history of Aspiration, Asthma, Chronic Obstructive Pulmonary Disease (COPD), Pneumothorax, Sleep Apnea, Tuberculosis Gastrointestinal Denies history of Cirrhosis , Colitis, Crohn s, Hepatitis A, Hepatitis B, Hepatitis C Endocrine KIETH, HARTIS ([id number]) Patient has history of Type II Diabetes Denies history of Type I Diabetes Genitourinary Denies history of End Stage Renal Disease Immunological Denies  history of Lupus Erythematosus, Raynaud s, Scleroderma Integumentary (Skin) Denies history of History of Burn, History of pressure wounds Musculoskeletal Denies history of Gout, Rheumatoid Arthritis, Osteoarthritis, Osteomyelitis Neurologic Denies history of Dementia, Neuropathy, Quadriplegia, Paraplegia, Seizure Disorder Oncologic Denies history of Received Chemotherapy, Received Radiation Psychiatric Denies history of Anorexia/bulimia, Confinement Anxiety Patient is treated with Insulin. Blood sugar is not tested. Review of Systems (ROS) Eyes Denies complaints or symptoms of Dry Eyes, Vision Changes, Glasses / Contacts. Ear/Nose/Mouth/Throat Complains or has symptoms of Sinusitis. Denies complaints or symptoms of Difficult clearing ears. Hematologic/Lymphatic Denies complaints or symptoms of Bleeding / Clotting Disorders, Human Immunodeficiency Virus. Respiratory Denies complaints or symptoms of Chronic or frequent coughs, Shortness of Breath. Cardiovascular Complains or has symptoms of LE edema - rt leg.. Denies complaints or symptoms of Chest pain. Gastrointestinal Denies complaints or symptoms of Frequent diarrhea, Nausea, Vomiting. Endocrine Denies complaints or symptoms of Hepatitis, Thyroid disease, Polydypsia (Excessive Thirst). Genitourinary Denies complaints or symptoms of Kidney failure/ Dialysis, Incontinence/dribbling. Immunological Denies complaints or symptoms of Hives, Itching. Integumentary (Skin) Complains or has symptoms of Swelling - rt leg. Denies complaints or symptoms of Wounds, Bleeding or bruising tendency, Breakdown. Musculoskeletal Denies complaints or symptoms of Muscle Pain, Muscle Weakness. Neurologic Denies complaints or symptoms of Numbness/parasthesias, Focal/Weakness. Psychiatric Denies complaints or symptoms of Anxiety, Claustrophobia. Objective DONNELLE, OLMEDA. ([id number]) Constitutional sitting or standing blood pressure is within  target range for patient.. pulse regular and within target range for patient.[name] respirations regular, non-labored and within target range for patient.[name] temperature within target range for patient.. Well- nourished and well-hydrated in no acute distress. Vitals Time Taken: 10:30 AM, Height: 68 in, Source: Stated, Weight: 210 lbs, Source: Stated, BMI: 31.9, Temperature: 97.6  F, Pulse: 63 bpm, Respiratory Rate: 16 breaths/min, Blood Pressure: 111/65 mmHg. Eyes conjunctiva clear no eyelid edema noted. pupils equal round and reactive to light and accommodation. Ears, Nose, Mouth, and Throat no gross abnormality of ear auricles or external auditory canals. normal hearing noted during conversation. mucus membranes moist. Respiratory normal breathing without difficulty. clear to auscultation bilaterally. Cardiovascular regular rate and rhythm with normal S1, S2. 1+ dorsalis pedis/posterior tibialis pulses of the right LE. no clubbing, cyanosis, significant edema, Gastrointestinal (GI) soft, non-tender, non-distended, +BS. no ventral hernia noted. Musculoskeletal normal gait and posture. Surgical/injury changes of the right LE noted in the lower portion of his leg due to a prior horse injury.[name] Psychiatric this patient is able to make decisions and demonstrates good insight into disease process. Alert and Oriented x 3. pleasant and cooperative. General Notes: Upon evaluation today patient's wound actually appears to have a significant amount of necrotic tissue noted in the surface of the wound bed. Subsequently I explained that sharp debridement would be necessary and proceeded to go forth with sharp debridement. However  as as it was debrided it became obvious that was actually necrotic bone in the base of the wound which subsequently was debrided away in a sample was obtained for both pathology and culture today. Nonetheless I think this is a fairly good indication that he does have osteomyelitis  of the great toe. Integumentary (Hair, Skin) Wound #1 status is Open. Original cause of wound was Blister. The wound is located on the [employer]. The wound measures 1.9cm length x 1.4cm width x 0.4cm depth; 2.089cm^2 area and 0.836cm^3 volume. There is Fat Layer (Subcutaneous Tissue) Exposed exposed. There is no tunneling or undermining noted. There is a medium amount of serosanguineous drainage noted. The wound margin is distinct with the outline attached to the wound base. There is no granulation within the wound bed. There is a large (67-100%) amount of necrotic tissue within the wound bed including Eschar and Adherent Slough. The periwound skin appearance exhibited: Callus, Maceration. The periwound skin appearance did not exhibit: Crepitus, Excoriation, Induration, Rash, Scarring, Dry/Scaly, Atrophie Blanche, Cyanosis, Ecchymosis, Hemosiderin Staining, Mottled, Pallor, Rubor, Erythema. Periwound temperature was noted as No Abnormality. The periwound has tenderness on palpation. Assessment Active Problems CRAIGE, PATEL ([id number]) ICD-10 Type 2 diabetes mellitus with foot ulcer Non-pressure chronic ulcer of other part of right foot with necrosis of bone Other specified peripheral vascular diseases Procedures Wound #1 Pre-procedure diagnosis of Wound #1 is a Diabetic Wound/Ulcer of the Lower Extremity located on the Right,Plantar Toe Great .Severity of Tissue Pre Debridement is: Fat layer exposed. There was a Excisional Skin/Subcutaneous Tissue/Muscle/Bone Debridement with a total area of 2.66 sq cm performed by STONE III, HOYT E., PA-C. With the following instrument(s): Curette to remove Viable and Non-Viable tissue/material. Material removed includes Bone,Tendon, Callus, Subcutaneous Tissue, and Slough after achieving pain control using Lidocaine. No specimens were taken. A time out was conducted at 11:28, prior to the start of the procedure. A Moderate amount of  bleeding was controlled with Pressure. The procedure was tolerated well with a pain level of 0 throughout and a pain level of 0 following the procedure. Post Debridement Measurements: 1.9cm length x 1.4cm width x 0.4cm depth; 0.836cm^3 volume. Character of Wound/Ulcer Post Debridement is improved. Severity of Tissue Post Debridement is: Necrosis of bone. Post procedure Diagnosis Wound #1: Same as Pre-Procedure Plan Wound Cleansing: Wound #1 Right,Plantar Toe Great: May Shower, gently pat wound dry prior to applying new dressing. - Dial Antibacterial soap Primary Wound Dressing: Silver Alginate Secondary Dressing: Conform/Kerlix Dressing Change Frequency: Wound #1 Right,Plantar Toe Great: Change dressing every other day. Follow-up Appointments: Wound #1 Right,Plantar Toe Great: Return Appointment in 1 week. Off-Loading: Wound #1 Right,Plantar Toe Great: Open toe surgical shoe with peg assist. Laboratory ordered were: Wound culture routine, Tiss Path Bx report Radiology ordered were: Magnetic Resonance Imaging (MRI) Consults ordered were: Infectious Disease - [facility] The following medication(s) was prescribed: Cipro oral 500 mg tablet 1 1 tablet oral taken 2 times a day for 14 days or until follow-up with infectious disease starting 07/23/2018 MINOR, IDEN ([id number]) At this point explain to the patient that our goal is going to be trying to save his great toe from the other possibility of amputation. For now I want him to continue with the Keflex we're gonna use a silver alginate dressing at this point in time. Subsequently I am going to see him back for reevaluation in one weeks time to see were things stand. We placed them in a peg assist the offloading  shoe and I am also gonna send him for an MRI to see the extent to which the infection may have spread hopefully just the tip of the toe although if it is more extensive we need to know that as well. The patient is in agreement  with the plan. We are gonna make a referral to [facility] infectious disease clinic as well. Again I spent a significant amount of time with the patient today both and evaluation, debridement, and explanation of what is going on in our treatment goals. He understands and was appreciative. Otherwise we will see were things stand at follow-up. Please see above for specific wound care orders. We will see patient for re-evaluation in 1 week(s) here in the clinic. If anything worsens or changes patient will contact our office for additional recommendations. Electronic Signature(s) Signed: 07/23/2018 5:02:32 PM By: Worthy Keeler PA-C Previous Signature: 07/21/2018 7:11:38 AM Version By: Worthy Keeler PA-C Entered By: Worthy Keeler on 07/23/2018 15:16:25 HOLTON, SIDMAN ([id number]) -------------------------------------------------------------------------------- ROS/PFSH Details Patient Name: JEVON, SHELLS. Date of Service: 07/20/2018 10:30 AM Medical Record Number: [id number] Patient Account Number: [id number] Date of Birth/Sex: 1961/04/22 (58 y.o. M) Treating RN: Secundino Ginger Primary Care Provider: [name] Other Clinician: Referring Provider: Lenise Arena Treating Provider/Extender: Melburn Hake, HOYT Weeks in Treatment: 0 Information Obtained From Patient Wound History Do you currently have one or more open woundso No Have you tested positive for osteomyelitis (bone infection)o No Have you had any tests for circulation on your legso No Have you had other problems associated with your woundso Swelling Eyes Complaints and Symptoms: Negative for: Dry Eyes; Vision Changes; Glasses / Contacts Ear/Nose/Mouth/Throat Complaints and Symptoms: Positive for: Sinusitis Negative for: Difficult clearing ears Hematologic/Lymphatic Complaints and Symptoms: Negative for: Bleeding / Clotting Disorders; Human Immunodeficiency Virus Medical History: Negative for: Anemia; Hemophilia; Human  Immunodeficiency Virus; Lymphedema; Sickle Cell Disease Respiratory Complaints and Symptoms: Negative for: Chronic or frequent coughs; Shortness of Breath Medical History: Negative for: Aspiration; Asthma; Chronic Obstructive Pulmonary Disease (COPD); Pneumothorax; Sleep Apnea; Tuberculosis Cardiovascular Complaints and Symptoms: Positive for: LE edema - rt leg. Negative for: Chest pain Gastrointestinal Complaints and Symptoms: Negative for: Frequent diarrhea; Nausea; Vomiting Medical History: Negative for: Cirrhosis ; Colitis; Crohnos; Hepatitis A; Hepatitis B; Hepatitis C WYLDER, MACOMBER. ([id number]) Endocrine Complaints and Symptoms: Negative for: Hepatitis; Thyroid disease; Polydypsia (Excessive Thirst) Medical History: Positive for: Type II Diabetes Negative for: Type I Diabetes Treated with: Insulin Blood sugar tested every day: No Genitourinary Complaints and Symptoms: Negative for: Kidney failure/ Dialysis; Incontinence/dribbling Medical History: Negative for: End Stage Renal Disease Immunological Complaints and Symptoms: Negative for: Hives; Itching Medical History: Negative for: Lupus Erythematosus; Raynaudos; Scleroderma Integumentary (Skin) Complaints and Symptoms: Positive for: Swelling - rt leg Negative for: Wounds; Bleeding or bruising tendency; Breakdown Medical History: Negative for: History of Burn; History of pressure wounds Musculoskeletal Complaints and Symptoms: Negative for: Muscle Pain; Muscle Weakness Medical History: Negative for: Gout; Rheumatoid Arthritis; Osteoarthritis; Osteomyelitis Neurologic Complaints and Symptoms: Negative for: Numbness/parasthesias; Focal/Weakness Medical History: Negative for: Dementia; Neuropathy; Quadriplegia; Paraplegia; Seizure Disorder Psychiatric Complaints and Symptoms: Negative for: Anxiety; Claustrophobia Medical History: BERNARD, SLAYDEN ([id number]) Negative for: Anorexia/bulimia; Confinement  Anxiety Oncologic Medical History: Negative for: Received Chemotherapy; Received Radiation Immunizations Pneumococcal Vaccine: Received Pneumococcal Vaccination: No Implantable Devices Family and Social History Cancer: No; Diabetes: Yes - Maternal Grandparents; Heart Disease: Yes - Mother; Hereditary Spherocytosis: No; Hypertension: No; Kidney Disease: No; Lung Disease: No; Seizures: No; Stroke: No; Thyroid Problems:  No; Tuberculosis: No; Never smoker; Marital Status - Married; Alcohol Use: Moderate; Drug Use: No History; Caffeine Use: Daily; Financial Concerns: No; Food, Clothing or Shelter Needs: No; Support System Lacking: No; Transportation Concerns: No; Advanced Directives: No; Patient does not want information on Advanced Directives; Do not resuscitate: No; Living Will: No; Medical Power of Attorney: No Electronic Signature(s) Signed: 07/20/2018 4:23:06 PM By: Secundino Ginger Signed: 07/21/2018 7:11:38 AM By: Worthy Keeler PA-C Entered By: Secundino Ginger on 07/20/2018 10:48:17 YAFET, CLINE ([id number]) -------------------------------------------------------------------------------- [place] Details Patient Name: [name]. Date of Service: 07/20/2018 Medical Record Number: [id number] Patient Account Number: [id number] Date of Birth/Sex: 07-22-1960 (58 y.o. 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 I73.89 Other specified peripheral vascular diseases Facility Procedures CPT4 Code: 69678938 Description: 10175 - DEB BONE 20 SQ CM/< ICD-10 Diagnosis Description L97.514 Non-pressure chronic ulcer of other part of right foot with ne Modifier:  crosis of bone Quantity: 1 Physician Procedures CPT4: Description Modifier Quantity Code 1025852 77824 - WC PHYS LEVEL 4 - NEW PT 25 1 ICD-10 Diagnosis Description 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

## 2018-07-24 NOTE — Progress Notes (Signed)
Harold, [name] ([id number]) Visit Report for 07/20/2018 Allergy List Details Patient Name: [name], [name]. Date of Service: 07/20/2018 10:30 AM Medical Record Number: [id number] Patient Account Number: [id number] Date of Birth/Sex: 1961/02/09 (58 y.o. M) Treating RN: Secundino Ginger Primary Care Melchor Kirchgessner: [name] Other Clinician: Referring Glenda Spelman: Lenise Arena Treating Zayden Hahne/Extender: STONE III, HOYT Weeks in Treatment: 0 Allergies Active Allergies No Known Allergies Allergy Notes Electronic Signature(s) Signed: 07/20/2018 4:23:06 PM By: Secundino Ginger Entered By: Secundino Ginger on 07/20/2018 10:36:35 [name] ([id number]) -------------------------------------------------------------------------------- Arrival Information Details Patient Name: [name], [name]. Date of Service: 07/20/2018 10:30 AM Medical Record Number: [id number] Patient Account Number: [id number] Date of Birth/Sex: 1960/08/06 (58 y.o. M) Treating RN: Secundino Ginger Primary Care Irys Nigh: [name] Other Clinician: Referring Jarius Dieudonne: Lenise Arena Treating Kody Brandl/Extender: Melburn Hake, HOYT Weeks in Treatment: 0 Visit Information Patient Arrived: Ambulatory Arrival Time: 10:25 Accompanied By: wife Transfer Assistance: None Patient Identification Verified: Yes Secondary Verification Process Completed: Yes Electronic Signature(s) Signed: 07/20/2018 4:23:06 PM By: Secundino Ginger Entered By: Secundino Ginger on 07/20/2018 10:28:13 [name] ([id number]) -------------------------------------------------------------------------------- Clinic Level of Care Assessment Details Patient Name: [name]. Date of Service: 07/20/2018 10:30 AM Medical Record Number: [id number] Patient Account Number: [id number] Date of Birth/Sex: 1960/08/21 (58 y.o. M) Treating RN: Harold Barban Primary Care Mio Schellinger: [name] Other Clinician: Referring Alwaleed Obeso: Lenise Arena Treating Shreyas Piatkowski/Extender: Melburn Hake, HOYT Weeks in Treatment: 0 Clinic Level of Care Assessment Items TOOL 1 Quantity Score []  - Use when EandM and Procedure is performed on INITIAL visit 0 ASSESSMENTS - Nursing Assessment / Reassessment X - General Physical Exam (combine w/ comprehensive assessment (listed just below) when 1 20 performed on new pt. evals) X- 1 25 Comprehensive Assessment (HX, ROS, Risk Assessments, Wounds Hx, etc.) ASSESSMENTS - Wound and Skin Assessment / Reassessment []  - Dermatologic / Skin Assessment (not related to wound area) 0 ASSESSMENTS - Ostomy and/or Continence Assessment and Care []  - Incontinence Assessment and Management 0 []  - 0 Ostomy Care Assessment and Management (repouching, etc.) PROCESS - Coordination of Care X - Simple Patient / Family Education for ongoing care 1 15 []  - 0 Complex (extensive) Patient / Family Education for ongoing care X- 1 10 Staff obtains Programmer, systems, Records, Test Results / Process Orders []  - 0 Staff telephones HHA, Nursing Homes / Clarify orders / etc []  - 0 Routine Transfer to another Facility (non-emergent condition) []  - 0 Routine Hospital Admission (non-emergent condition) X- 1 15 New Admissions / Biomedical engineer / Ordering NPWT, Apligraf, etc. []  - 0 Emergency Hospital Admission (emergent condition) PROCESS - Special Needs []  - Pediatric / Minor Patient Management 0 []  - 0 Isolation Patient Management []  - 0 Hearing / Language / Visual special needs []  - 0 Assessment of Community assistance (transportation, D/C planning, etc.) []  - 0 Additional assistance / Altered mentation []  - 0 Support Surface(s) Assessment (bed, cushion, seat, etc.) Harold, [name] ([id number]) INTERVENTIONS - Miscellaneous []  - External ear exam 0 []  - 0 Patient Transfer (multiple staff / Civil Service fast streamer / Similar devices) []  - 0 Simple Staple / Suture removal (25 or less) []  - 0 Complex Staple / Suture  removal (26 or more) []  - 0 Hypo/Hyperglycemic Management (do not check if billed separately) X- 1 15 Ankle / Brachial Index (ABI) - do not check if billed separately Has the patient been seen at the hospital within the last three years: Yes Total Score: 100 Level Of Care: New/Established - Level 3 Electronic  Signature(s) Signed: 07/23/2018 2:41:13 PM By: Harold Barban Entered By: Harold Barban on 07/20/2018 11:54:27 [name] ([id number]) -------------------------------------------------------------------------------- Lower Extremity Assessment Details Patient Name: [name], [name]. Date of Service: 07/20/2018 10:30 AM Medical Record Number: [id number] Patient Account Number: [id number] Date of Birth/Sex: [birth year] (58 y.o. M) Treating RN: Secundino Ginger Primary Care Lucienne Sawyers: [name] Other Clinician: Referring Yurika Pereda: Lenise Arena Treating Baily Hovanec/Extender: Melburn Hake, HOYT Weeks in Treatment: 0 Edema Assessment Assessed: [Left: No] [Right: No] Edema: [Left: No] [Right: No] Calf Left: Right: Point of Measurement: 35 cm From Medial Instep 34.5 cm 34 cm Ankle Left: Right: Point of Measurement: 13 cm From Medial Instep 21.5 cm 23 cm Vascular Assessment Claudication: Claudication Assessment [Left:None] [Right:None] Pulses: Dorsalis Pedis Palpable: [Left:Yes] [Right:Yes] Posterior Tibial Extremity colors, hair growth, and conditions: Extremity Color: [Left:Normal] [Right:Normal] Hair Growth on Extremity: [Left:Yes] [Right:Yes] Temperature of Extremity: [Left:Warm] [Right:Warm] Capillary Refill: [Left:> 3 seconds] [Right:> 3 seconds] Blood Pressure: Brachial: [Right:140] Dorsalis Pedis: [Left:Dorsalis Pedis: 160] Ankle: Posterior Tibial: [Left:Posterior Tibial: 160] [Right:1.14] Toe Nail Assessment Left: Right: Thick: Yes Yes Discolored: Yes Yes Deformed: No No Improper Length and Hygiene: No No Electronic Signature(s) Signed: 07/20/2018  4:23:06 PM By: Secundino Ginger Entered By: Secundino Ginger on 07/20/2018 11:12:13 [name] ([id number]) -------------------------------------------------------------------------------- Multi Wound Chart Details Patient Name: [name]. Date of Service: 07/20/2018 10:30 AM Medical Record Number: [id number] Patient Account Number: [id number] Date of Birth/Sex: [birth year] (58 y.o. M) Treating RN: Harold Barban Primary Care Madelyn Tlatelpa: [name] Other Clinician: Referring Maycel Riffe: Lenise Arena Treating Shuntavia Yerby/Extender: Melburn Hake, HOYT Weeks in Treatment: 0 Vital Signs Height(in): 68 Pulse(bpm): 71 Weight(lbs): 210 Blood Pressure(mmHg): 111/65 Body Mass Index(BMI): 32 Temperature(F): 97.6 Respiratory Rate 16 (breaths/min): Photos: [1:No Photos] [N/A:N/A] Wound Location: [1:Right Toe Great - Plantar] [N/A:N/A] Wounding Event: [1:Blister] [N/A:N/A] Primary Etiology: [1:Diabetic Wound/Ulcer of the Lower Extremity] [N/A:N/A] Comorbid History: [1:Type II Diabetes] [N/A:N/A] Date Acquired: [1:05/04/2018] [N/A:N/A] Weeks of Treatment: [1:0] [N/A:N/A] Wound Status: [1:Open] [N/A:N/A] Measurements L x W x D [1:1.9x1.4x0.4] [N/A:N/A] (cm) Area (cm) : [1:2.089] [N/A:N/A] Volume (cm) : [1:0.836] [N/A:N/A] % Reduction in Area: [1:0.00%] [N/A:N/A] % Reduction in Volume: [1:0.00%] [N/A:N/A] Classification: [1:Grade 1] [N/A:N/A] Exudate Amount: [1:Medium] [N/A:N/A] Exudate Type: [1:Serosanguineous] [N/A:N/A] Exudate Color: [1:red, brown] [N/A:N/A] Wound Margin: [1:Distinct, outline attached] [N/A:N/A] Granulation Amount: [1:None Present (0%)] [N/A:N/A] Necrotic Amount: [1:Large (67-100%)] [N/A:N/A] Necrotic Tissue: [1:Eschar, Adherent Slough] [N/A:N/A] Exposed Structures: [1:Fat Layer (Subcutaneous Tissue) Exposed: Yes Fascia: No Tendon: No Muscle: No Joint: No Bone: No] [N/A:N/A] Periwound Skin Texture: [1:Callus: Yes Excoriation: No Induration: No Crepitus:  No Rash: No Scarring: No] [N/A:N/A] Periwound Skin Moisture: [N/A:N/A] Maceration: Yes Dry/Scaly: No Periwound Skin Color: Atrophie Blanche: No N/A N/A Cyanosis: No Ecchymosis: No Erythema: No Hemosiderin Staining: No Mottled: No Pallor: No Rubor: No Temperature: No Abnormality N/A N/A Tenderness on Palpation: Yes N/A N/A Wound Preparation: Ulcer Cleansing: N/A N/A Rinsed/Irrigated with Saline Topical Anesthetic Applied: Other: lidocaine 4% Treatment Notes Electronic Signature(s) Signed: 07/23/2018 2:41:13 PM By: Harold Barban Entered By: Harold Barban on 07/20/2018 11:26:06 [name] ([id number]) -------------------------------------------------------------------------------- [place] Details Patient Name: [name], [name]. Date of Service: 07/20/2018 10:30 AM Medical Record Number: [id number] Patient Account Number: [id number] Date of Birth/Sex: 1961/04/28 (58 y.o. M) Treating RN: Harold Barban Primary Care Kamari Buch: [name] Other Clinician: Referring Linnell Swords: Lenise Arena Treating Cadyn Fann/Extender: Melburn Hake, HOYT Weeks in Treatment: 0 Active Inactive Wound/Skin Impairment Nursing Diagnoses: Impaired tissue integrity Goals: Ulcer/skin breakdown will have a volume reduction of 30% by week 4  Date Initiated: 07/20/2018 Target Resolution Date: 08/20/2018 Goal Status: Active Interventions: Assess patient/caregiver ability to obtain necessary supplies Assess patient/caregiver ability to perform ulcer/skin care regimen upon admission and as needed Notes: Electronic Signature(s) Signed: 07/23/2018 2:41:13 PM By: Harold Barban Entered By: Harold Barban on 07/20/2018 11:25:45 [name] ([id number]) -------------------------------------------------------------------------------- Pain Assessment Details Patient Name: [name]. Date of Service: 07/20/2018 10:30 AM Medical Record Number:  [id number] Patient Account Number: [id number] Date of Birth/Sex: 1961-03-19 (58 y.o. M) Treating RN: Secundino Ginger Primary Care Melia Hopes: [name] Other Clinician: Referring Jamekia Gannett: Lenise Arena Treating Cuauhtemoc Huegel/Extender: Melburn Hake, HOYT Weeks in Treatment: 0 Active Problems Location of Pain Severity and Description of Pain Patient Has Paino Yes Site Locations Pain Location: Pain in Ulcers Rate the pain. Current Pain Level: 9 Character of Pain Describe the Pain: Shooting Pain Management and Medication Current Pain Management: Notes currently primary following pt for pain management. Electronic Signature(s) Signed: 07/20/2018 4:23:06 PM By: Secundino Ginger Entered By: Secundino Ginger on 07/20/2018 10:30:31 [name] ([id number]) -------------------------------------------------------------------------------- Patient/Caregiver Education Details Patient Name: [name], [name]. Date of Service: 07/20/2018 10:30 AM Medical Record Number: [id number] Patient Account Number: [id number] Date of Birth/Gender: [birth year] (58 y.o. M) Treating RN: Harold Barban Primary Care Physician: [name] Other Clinician: Referring Physician: Lenise Arena Treating Physician/Extender: Melburn Hake, HOYT Weeks in Treatment: 0 Education Assessment Education Provided To: Patient Education Topics Provided Wound/Skin Impairment: Handouts: Caring for Your Ulcer Methods: Demonstration, Explain/Verbal Responses: State content correctly Electronic Signature(s) Signed: 07/23/2018 2:41:13 PM By: Harold Barban Entered By: Harold Barban on 07/20/2018 11:34:31 [name] ([id number]) -------------------------------------------------------------------------------- Wound Assessment Details Patient Name: [name]. Date of Service: 07/20/2018 10:30 AM Medical Record Number: [id number] Patient Account Number: [id number] Date of Birth/Sex: 1961/06/13 (58 y.o.  M) Treating RN: Secundino Ginger Primary Care Jeiry Birnbaum: [name] Other Clinician: Referring Karma Ansley: Lenise Arena Treating Laterrian Hevener/Extender: STONE III, HOYT Weeks in Treatment: 0 Wound Status Wound Number: 1 Primary Etiology: Diabetic Wound/Ulcer of the Lower Extremity Wound Location: Right Toe Great - Plantar Wound Status: Open Wounding Event: Blister Comorbid Type II Diabetes Date Acquired: 05/04/2018 History: Weeks Of Treatment: 0 Clustered Wound: No Photos Photo Uploaded By: Secundino Ginger on 07/20/2018 11:52:32 Wound Measurements Length: (cm) 1.9 % Reductio Width: (cm) 1.4 % Reductio Depth: (cm) 0.4 Tunneling: Area: (cm) 2.089 Undermini Volume: (cm) 0.836 n in Area: 0% n in Volume: 0% No ng: No Wound Description Classification: Grade 1 Wound Margin: Distinct, outline attached Exudate Amount: Medium Exudate Type: Serosanguineous Exudate Color: red, brown Wound Bed Granulation Amount: None Present (0%) Exposed Structure Necrotic Amount: Large (67-100%) Fascia Exposed: No Necrotic Quality: Eschar, Adherent Slough Fat Layer (Subcutaneous Tissue) Exposed: Yes Tendon Exposed: No Muscle Exposed: No Joint Exposed: No Bone Exposed: No Periwound Skin Texture [name], [name]. ([id number]) Texture Color No Abnormalities Noted: No No Abnormalities Noted: No Callus: Yes Atrophie Blanche: No Crepitus: No Cyanosis: No Excoriation: No Ecchymosis: No Induration: No Erythema: No Rash: No Hemosiderin Staining: No Scarring: No Mottled: No Pallor: No Moisture Rubor: No No Abnormalities Noted: No Dry / Scaly: No Temperature / Pain Maceration: Yes Temperature: No Abnormality Tenderness on Palpation: Yes Wound Preparation Ulcer Cleansing: Rinsed/Irrigated with Saline Topical Anesthetic Applied: Other: lidocaine 4%, Electronic Signature(s) Signed: 07/20/2018 4:23:06 PM By: Secundino Ginger Signed: 07/21/2018 7:11:38 AM By: Worthy Keeler PA-C Entered By:  Worthy Keeler on 07/20/2018 11:18:11 [name], [name] ([id number]) -------------------------------------------------------------------------------- Vitals Details Patient Name: [name], [name]. Date of Service: 07/20/2018 10:30 AM Medical Record  Number: [id number] Patient Account Number: [id number] Date of Birth/Sex: 1960-09-14 (57 y.o. M) Treating RN: Secundino Ginger Primary Care Reyes Fifield: [name] Other Clinician: Referring Dawnn Nam: Lenise Arena Treating Caleesi Kohl/Extender: Melburn Hake, HOYT Weeks in Treatment: 0 Vital Signs Time Taken: 10:30 Temperature (F): 97.6 Height (in): 68 Pulse (bpm): 63 Source: Stated Respiratory Rate (breaths/min): 16 Weight (lbs): 210 Blood Pressure (mmHg): 111/65 Source: Stated Reference Range: 80 - 120 mg / dl Body Mass Index (BMI): 31.9 Electronic Signature(s) Signed: 07/20/2018 4:23:06 PM By: Secundino Ginger Entered BySecundino Ginger on 07/20/2018 10:31:41

## 2018-07-27 ENCOUNTER — Encounter: Payer: Medicaid Other | Admitting: Physician Assistant

## 2018-07-27 DIAGNOSIS — L97512 Non-pressure chronic ulcer of other part of right foot with fat layer exposed: Secondary | ICD-10-CM | POA: Diagnosis not present

## 2018-07-27 DIAGNOSIS — L97514 Non-pressure chronic ulcer of other part of right foot with necrosis of bone: Secondary | ICD-10-CM | POA: Diagnosis not present

## 2018-07-27 DIAGNOSIS — Z881 Allergy status to other antibiotic agents status: Secondary | ICD-10-CM | POA: Diagnosis not present

## 2018-07-27 DIAGNOSIS — E11621 Type 2 diabetes mellitus with foot ulcer: Secondary | ICD-10-CM | POA: Diagnosis not present

## 2018-07-27 MED ORDER — ACCU-CHEK GUIDE W/DEVICE KIT: 1.0000 | PACK | Freq: Every day | 0 refills | Status: DC | PRN

## 2018-07-27 MED ORDER — ACCU-CHEK GUIDE VI STRP: ORAL_STRIP | 12 refills | Status: DC

## 2018-07-27 MED ORDER — ACCU-CHEK FASTCLIX LANCETS MISC: 12 refills | Status: DC

## 2018-07-27 NOTE — Progress Notes (Signed)
ANGELINA, NEECE ([id number]) Visit Report for 07/27/2018 Chief Complaint Document Details Patient Name: [name], [name]. Date of Service: 07/27/2018 10:15 AM Medical Record Number: [id number] Patient Account Number: [id number] Date of Birth/Sex: 1961/01/08 (58 y.o. M) Treating RN: Harold Barban Primary Care Provider: [name] Other Clinician: Referring Provider: [name] Treating Provider/Extender: Melburn Hake, HOYT Weeks in Treatment: 1 Information Obtained from: Patient Chief Complaint Right great toe ulcer Electronic Signature(s) Signed: 07/27/2018 3:59:30 PM By: Worthy Keeler PA-C Entered By: Worthy Keeler on 07/27/2018 10:16:26 [name] ([id number]) -------------------------------------------------------------------------------- Debridement Details Patient Name: [name]. Date of Service: 07/27/2018 10:15 AM Medical Record Number: [id number] Patient Account Number: [id number] Date of Birth/Sex: Jan 11, 1961 (58 y.o. M) Treating RN: Harold Barban Primary Care Provider: [name] Other Clinician: Referring Provider: [name] Treating Provider/Extender: Melburn Hake, HOYT Weeks in Treatment: 1 Debridement Performed for Wound #1 Right,Plantar Toe Great Assessment: Performed By: Physician STONE III, HOYT E., PA-C Debridement Type: Debridement Severity of Tissue Pre Fat layer exposed Debridement: Level of Consciousness (Pre- Awake and Alert procedure): Pre-procedure Verification/Time Yes - 10:39 Out Taken: Start Time: 10:39 Pain Control: Lidocaine Total Area Debrided (L x W): 1.9 (cm) x 0.9 (cm) = 1.71 (cm) Tissue and other material Non-Viable, Slough, Subcutaneous, Slough debrided: Level: Skin/Subcutaneous Tissue Debridement Description: Excisional Instrument: Curette Bleeding: Minimum End Time: 10:42 Procedural Pain: 0 Post Procedural Pain: 0 Response to Treatment: Procedure was  tolerated well Level of Consciousness Awake and Alert (Post-procedure): Post Debridement Measurements of Total Wound Length: (cm) 1.9 Width: (cm) 0.9 Depth: (cm) 0.4 Volume: (cm) 0.537 Character of Wound/Ulcer Post Debridement: Improved Severity of Tissue Post Debridement: Fat layer exposed Post Procedure Diagnosis Same as Pre-procedure Electronic Signature(s) Signed: 07/27/2018 3:50:02 PM By: Harold Barban Signed: 07/27/2018 3:59:30 PM By: Worthy Keeler PA-C Entered By: Harold Barban on 07/27/2018 10:41:04 [name] ([id number]) -------------------------------------------------------------------------------- HPI Details Patient Name: [name]. Date of Service: 07/27/2018 10:15 AM Medical Record Number: [id number] Patient Account Number: [id number] Date of Birth/Sex: Aug 09, 1960 (58 y.o. M) Treating RN: Harold Barban Primary Care Provider: [name] Other Clinician: Referring Provider: [name] Treating Provider/Extender: Melburn Hake, HOYT Weeks in Treatment: 1 History of Present Illness HPI Description: 07/20/17 on evaluation today patient actually appears to be doing somewhat poorly in regard to his right plantar great toe ulcer. This is something that he states started as what he thought to be a blister May 04, 2018. Subsequently he treated this on his own for a while thinking that it would just get better. As it did not get better he eventually did go to have this evaluated where it was treated appropriately with it sounds like Bactroban ointment however the patient states that it never completely resolved. Subsequently if this continue to get worse and was looking more irritated and inflamed he did go to the hospital more recently where he was prescribed Keflex. Subsequently he was also referred to the wound care center here for further evaluation by [place]. He had an x-ray in the ER which revealed no evidence of  osteomyelitis that I explained to the patient that osteomyelitis can be present even in a negative x-ray as it's not nearly as sensitive as an MRI would be. No fevers, chills, nausea, or vomiting noted at this time. The patient tells me that this might look a little bit better since he started the antibiotics. He does have a history of diabetes mellitus type II he also had a horse accident years ago  and had to have significant surgery in regard to his right lower extremity that did leave some deformity in this regard. This is mainly centered in the shin region. 07/27/18 upon evaluation today patient appears to be doing fairly well in regard to his toe ulcer all things considered. 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 This obviously is good news. With that being said I do think that he did have some infection which obviously calls some of the necrosis although the wound seems to be doing better he is definitely tolerating the Cipro which I switched into based on the bone culture and seems to be dramatically improving in this regard. Overall I'm pleased with how things stand. Electronic Signature(s) Signed: 07/27/2018 3:59:30 PM By: Worthy Keeler PA-C Entered By: Worthy Keeler on 07/27/2018 15:45:17 TRAVIAN, KERNER ([id number]) -------------------------------------------------------------------------------- Physical Exam Details Patient Name: [name]. Date of Service: 07/27/2018 10:15 AM Medical Record Number: [id number] Patient Account Number: [id number] Date of Birth/Sex: 1961-02-05 (58 y.o. M) Treating RN: Harold Barban Primary Care Provider: [name] Other Clinician: Referring Provider: [name] Treating Provider/Extender: STONE III, HOYT Weeks in Treatment: 1 Constitutional Well-nourished and  well-hydrated in no acute distress. Respiratory normal breathing without difficulty. Psychiatric this patient is able to make decisions and demonstrates good insight into disease process. Alert and Oriented x 3. pleasant and cooperative. Notes Patient's wound bed currently shows evidence of good granulation although there was some [facility] he does seem to be showing signs of improvement. I did remove as much of the necrotic material as I could today and he tolerated this without complication. Bohnen still exposed although there does not appear to be any evidence of necrotic bone today. This seems to be fairly healthy which is great news. Overall he tolerated the debridement without any significant pain which was great news. Electronic Signature(s) Signed: 07/27/2018 3:59:30 PM By: Worthy Keeler PA-C Entered By: Worthy Keeler on 07/27/2018 15:45:52 [name] ([id number]) -------------------------------------------------------------------------------- Physician Orders Details Patient Name: HRISHIKESH, HOEG. Date of Service: 07/27/2018 10:15 AM Medical Record Number: [id number] Patient Account Number: [id number] Date of Birth/Sex: 1960/09/13 (58 y.o. M) Treating RN: Harold Barban Primary Care Provider: [name] Other Clinician: Referring Provider: [name] Treating Provider/Extender: Melburn Hake, HOYT Weeks in Treatment: 1 Verbal / Phone Orders: No Diagnosis Coding ICD-10 Coding Code Description E11.621 Type 2 diabetes mellitus with foot ulcer L97.514 Non-pressure chronic ulcer of other part of right foot with necrosis of bone I73.89 Other specified peripheral vascular diseases Wound Cleansing Wound #1 Right,Plantar Toe Great o May Shower, gently pat wound dry prior to applying new dressing. - Dial Antibacterial soap Primary Wound Dressing o Silver Alginate Secondary Dressing o Conform/Kerlix Dressing Change Frequency Wound #1  Right,Plantar Toe Great o Change dressing every other day. Follow-up Appointments Wound #1 Right,Plantar Toe Great o Return Appointment in 1 week. Off-Loading Wound #1 Right,Plantar Toe Great o Open toe surgical shoe with peg assist. Electronic Signature(s) Signed: 07/27/2018 3:50:02 PM By: Harold Barban Signed: 07/27/2018 3:59:30 PM By: Worthy Keeler PA-C Entered By: Harold Barban on 07/27/2018 10:45:31 [name] ([id number]) -------------------------------------------------------------------------------- Problem List Details Patient Name: NORI, POLAND. Date of Service: 07/27/2018 10:15 AM Medical Record Number: [id number] Patient Account Number: [id number] Date of Birth/Sex: 12-05-1960 (58 y.o. M) Treating RN: Harold Barban Primary Care Provider: [name]  Other Clinician: Referring Provider: [name] Treating Provider/Extender: Melburn Hake, HOYT Weeks in Treatment: 1 Active Problems ICD-10 Evaluated Encounter Code Description Active Date Today Diagnosis E11.621 Type 2 diabetes mellitus with foot ulcer 07/20/2018 No Yes L97.514 Non-pressure chronic ulcer of other part of right foot with 07/20/2018 No Yes necrosis of bone I73.89 Other specified peripheral vascular diseases 07/20/2018 No Yes Inactive Problems Resolved Problems Electronic Signature(s) Signed: 07/27/2018 3:59:30 PM By: Worthy Keeler PA-C Entered By: Worthy Keeler on 07/27/2018 10:16:20 [name] ([id number]) -------------------------------------------------------------------------------- Progress Note Details Patient Name: [name]. Date of Service: 07/27/2018 10:15 AM Medical Record Number: [id number] Patient Account Number: [id number] Date of Birth/Sex: 08-May-1961 (58 y.o. M) Treating RN: Harold Barban Primary Care Provider: [name] Other Clinician: Referring Provider: [name] Treating Provider/Extender: Melburn Hake, HOYT Weeks in Treatment: 1 Subjective Chief Complaint Information obtained from Patient Right great toe ulcer History of Present Illness (HPI) 07/20/17 on evaluation today patient actually appears to be doing somewhat poorly in regard to his right plantar great toe ulcer. This is something that he states started as what he thought to be a blister May 04, 2018. Subsequently he treated this on his own for a while thinking that it would just get better. As it did not get better he eventually did go to have this evaluated where it was treated appropriately with it sounds like Bactroban ointment however the patient states that it never completely resolved. Subsequently if this continue to get worse and was looking more irritated and inflamed he did go to the hospital more recently where he was prescribed Keflex. Subsequently he was also referred to the wound care center here for further evaluation by [place]. He had an x-ray in the ER which revealed no evidence of osteomyelitis that I explained to the patient that osteomyelitis can be present even in a negative x-ray as it's not nearly as sensitive as an MRI would be. No fevers, chills, nausea, or vomiting noted at this time. The patient tells me that this might look a little bit better since he started the antibiotics. He does have a history of diabetes mellitus type II he also had a horse accident years ago and had to have significant surgery in regard to his right lower extremity that did leave some deformity in this regard. This is mainly centered in the shin region. 07/27/18 upon evaluation today patient appears to be doing fairly well in regard to his toe ulcer all things considered. 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 This obviously is good news. With that being said I do think that he did have  some infection which obviously calls some of the necrosis although the wound seems to be doing better he is definitely tolerating the Cipro which I switched into based on the bone culture and seems to be dramatically improving in this regard. Overall I'm pleased with how things stand. Patient History Information obtained from Patient. Family History Diabetes - Maternal Grandparents, Heart Disease - Mother, No family history of Cancer, Hereditary Spherocytosis, Hypertension, Kidney Disease, Lung Disease, Seizures, Stroke, Thyroid Problems, Tuberculosis. Social History Never smoker, Marital Status - Married, Alcohol Use - Moderate, Drug Use - No History, Caffeine Use - Daily. Review of Systems (ROS) Constitutional Symptoms (General Health) Denies complaints or symptoms of Fever, Chills. Respiratory The patient has no complaints  or symptoms. Cardiovascular The patient has no complaints or symptoms. Psychiatric The patient has no complaints or symptoms. KAL, CHAIT. ([id number]) Objective Constitutional Well-nourished and well-hydrated in no acute distress. Vitals Time Taken: 10:15 AM, Height: 68 in, Weight: 210 lbs, BMI: 31.9, Temperature: 98.1 F, Pulse: 77 bpm, Respiratory Rate: 16 breaths/min, Blood Pressure: 112/77 mmHg. Respiratory normal breathing without difficulty. Psychiatric this patient is able to make decisions and demonstrates good insight into disease process. Alert and Oriented x 3. pleasant and cooperative. General Notes: Patient's wound bed currently shows evidence of good granulation although there was some Slough he does seem to be showing signs of improvement. I did remove as much of the necrotic material as I could today and he tolerated this without complication. Bohnen still exposed although there does not appear to be any evidence of necrotic bone today. This seems to be fairly healthy which is great news. Overall he tolerated the debridement without any  significant pain which was great news. Integumentary (Hair, Skin) Wound #1 status is Open. Original cause of wound was Blister. The wound is located on the [employer]. The wound measures 1.9cm length x 0.9cm width x 0.4cm depth; 1.343cm^2 area and 0.537cm^3 volume. There is Fat Layer (Subcutaneous Tissue) Exposed exposed. There is no tunneling or undermining noted. There is a medium amount of serosanguineous drainage noted. The wound margin is distinct with the outline attached to the wound base. There is small (1-33%) granulation within the wound bed. There is a large (67-100%) amount of necrotic tissue within the wound bed including Eschar and Adherent Slough. The periwound skin appearance exhibited: Callus, Maceration. The periwound skin appearance did not exhibit: Crepitus, Excoriation, Induration, Rash, Scarring, Dry/Scaly, Atrophie Blanche, Cyanosis, Ecchymosis, Hemosiderin Staining, Mottled, Pallor, Rubor, Erythema. Periwound temperature was noted as No Abnormality. The periwound has tenderness on palpation. Assessment Active Problems ICD-10 Type 2 diabetes mellitus with foot ulcer Non-pressure chronic ulcer of other part of right foot with necrosis of bone Other specified peripheral vascular diseases PHINEHAS, GROUNDS ([id number]) Procedures Wound #1 Pre-procedure diagnosis of Wound #1 is a Diabetic Wound/Ulcer of the Lower Extremity located on the Right,Plantar Toe Great .Severity of Tissue Pre Debridement is: Fat layer exposed. There was a Excisional Skin/Subcutaneous Tissue Debridement with a total area of 1.71 sq cm performed by STONE III, HOYT E., PA-C. With the following instrument(s): Curette to remove Non-Viable tissue/material. Material removed includes Subcutaneous Tissue and Slough and after achieving pain control using Lidocaine. No specimens were taken. A time out was conducted at 10:39, prior to the start of the procedure. A Minimum amount of bleeding was  controlled with N/A. The procedure was tolerated well with a pain level of 0 throughout and a pain level of 0 following the procedure. Post Debridement Measurements: 1.9cm length x 0.9cm width x 0.4cm depth; 0.537cm^3 volume. Character of Wound/Ulcer Post Debridement is improved. Severity of Tissue Post Debridement is: Fat layer exposed. Post procedure Diagnosis Wound #1: Same as Pre-Procedure Plan Wound Cleansing: Wound #1 Right,Plantar Toe Great: May Shower, gently pat wound dry prior to applying new dressing. - Dial Antibacterial soap Primary Wound Dressing: Silver Alginate Secondary Dressing: Conform/Kerlix Dressing Change Frequency: Wound #1 Right,Plantar Toe Great: Change dressing every other day. Follow-up Appointments: Wound #1 Right,Plantar Toe Great: Return Appointment in 1 week. Off-Loading: Wound #1 Right,Plantar Toe Great: Open toe surgical shoe with peg assist. My suggestion currently is gonna be that we go ahead and initiate the above wound care measures for the next  week. The patient is in agreement with the plan. If anything changes or worsens in the meantime he will contact the office and let me know. Otherwise will see were things stand at follow-up. Please see above for specific wound care orders. We will see patient for re-evaluation in 1 week(s) here in the clinic. If anything worsens or changes patient will contact our office for additional recommendations. Electronic Signature(s) Signed: 07/27/2018 3:59:30 PM By: Worthy Keeler PA-C Entered By: Worthy Keeler on 07/27/2018 15:46:12 BANNER, HUCKABA ([id number]) -------------------------------------------------------------------------------- ROS/PFSH Details Patient Name: RADEN, BYINGTON. Date of Service: 07/27/2018 10:15 AM Medical Record Number: [id number] Patient Account Number: [id number] Date of Birth/Sex: 17-Jul-1960 (58 y.o. M) Treating RN: Harold Barban Primary Care Provider: [name] Other Clinician: Referring Provider: [name] Treating Provider/Extender: Melburn Hake, HOYT Weeks in Treatment: 1 Information Obtained From Patient Wound History Do you currently have one or more open woundso No Have you tested positive for osteomyelitis (bone infection)o No Have you had any tests for circulation on your legso No Have you had other problems associated with your woundso Swelling Constitutional Symptoms (General Health) Complaints and Symptoms: Negative for: Fever; Chills Hematologic/Lymphatic Medical History: Negative for: Anemia; Hemophilia; Human Immunodeficiency Virus; Lymphedema; Sickle Cell Disease Respiratory Complaints and Symptoms: No Complaints or Symptoms Medical History: Negative for: Aspiration; Asthma; Chronic Obstructive Pulmonary Disease (COPD); Pneumothorax; Sleep Apnea; Tuberculosis Cardiovascular Complaints and Symptoms: No Complaints or Symptoms Gastrointestinal Medical History: Negative for: Cirrhosis ; Colitis; Crohnos; Hepatitis A; Hepatitis B; Hepatitis C Endocrine Medical History: Positive for: Type II Diabetes Negative for: Type I Diabetes Treated with: Insulin Blood sugar tested every day: No Genitourinary HERBERTH, DEHARO ([id number]) Medical History: Negative for: End Stage Renal Disease Immunological Medical History: Negative for: Lupus Erythematosus; Raynaudos; Scleroderma Integumentary (Skin) Medical History: Negative for: History of Burn; History of pressure wounds Musculoskeletal Medical History: Negative for: Gout; Rheumatoid Arthritis; Osteoarthritis; Osteomyelitis Neurologic Medical History: Negative for: Dementia; Neuropathy; Quadriplegia; Paraplegia; Seizure Disorder Oncologic Medical History: Negative for: Received Chemotherapy; Received Radiation Psychiatric Complaints and Symptoms: No Complaints or Symptoms Medical History: Negative for: Anorexia/bulimia; Confinement  Anxiety Immunizations Pneumococcal Vaccine: Received Pneumococcal Vaccination: No Implantable Devices Family and Social History Cancer: No; Diabetes: Yes - Maternal Grandparents; Heart Disease: Yes - Mother; Hereditary Spherocytosis: No; Hypertension: No; Kidney Disease: No; Lung Disease: No; Seizures: No; Stroke: No; Thyroid Problems: No; Tuberculosis: No; Never smoker; Marital Status - Married; Alcohol Use: Moderate; Drug Use: No History; Caffeine Use: Daily; Financial Concerns: No; Food, Clothing or Shelter Needs: No; Support System Lacking: No; Transportation Concerns: No; Advanced Directives: No; Patient does not want information on Advanced Directives; Do not resuscitate: No; Living Will: No; Medical Power of Attorney: No Physician Affirmation I have reviewed and agree with the above information. Electronic Signature(s) Signed: 07/27/2018 3:50:02 PM By: Harold Barban Signed: 07/27/2018 3:59:30 PM By: Worthy Keeler PA-C Entered By: Worthy Keeler on 07/27/2018 15:45:39 BUREL, KAHRE ([id number]) GUY, SEESE ([id number]) -------------------------------------------------------------------------------- SuperBill Details Patient Name: MAJD, TISSUE. Date of Service: 07/27/2018 Medical Record Number: [id number] Patient Account Number: [id number] Date of Birth/Sex: Jan 01, 1961 (58 y.o. M) Treating RN: Harold Barban Primary Care Provider: [name] Other Clinician: Referring Provider: [name] Treating Provider/Extender: Melburn Hake, HOYT Weeks in Treatment: 1 Diagnosis Coding ICD-10 Codes Code Description E11.621 Type 2 diabetes mellitus with foot ulcer L97.514 Non-pressure chronic ulcer of other part of right foot with necrosis of bone I73.89 Other specified peripheral vascular diseases Facility Procedures CPT4 Code: [id number]  Description: 11042 - DEB SUBQ TISSUE 20 SQ CM/< ICD-10 Diagnosis Description L97.514 Non-pressure chronic ulcer  of other part of right foot with ne Modifier: crosis of bone Quantity: 1 Physician Procedures CPT4 Code: [id number] Description: 36468 - WC PHYS SUBQ TISS 20 SQ CM ICD-10 Diagnosis Description L97.514 Non-pressure chronic ulcer of other part of right foot with ne Modifier: crosis of bone Quantity: 1 Electronic Signature(s) Signed: 07/27/2018 3:59:30 PM By: Worthy Keeler PA-C Entered By: Worthy Keeler on 07/27/2018 15:46:22

## 2018-07-30 ENCOUNTER — Telehealth: Payer: Self-pay

## 2018-07-30 NOTE — Progress Notes (Signed)
DAIEL, STROHECKER ([id number]) Visit Report for 07/27/2018 Arrival Information Details Patient Name: [name], [name]. Date of Service: 07/27/2018 10:15 AM Medical Record Number: [id number] Patient Account Number: [id number] Date of Birth/Sex: 1961/04/18 (58 y.o. M) Treating RN: Secundino Ginger Primary Care Abe Schools: [name] Other Clinician: Referring Shahzaib Azevedo: [name] Treating Sharanda Shinault/Extender: Melburn Hake, HOYT Weeks in Treatment: 1 Visit Information History Since Last Visit Added or deleted any medications: No Patient Arrived: Ambulatory Any new allergies or adverse reactions: No Arrival Time: 10:14 Had a fall or experienced change in No Accompanied By: wife activities of daily living that may affect Transfer Assistance: None risk of falls: Patient Identification Verified: Yes Signs or symptoms of abuse/neglect since last visito No Secondary Verification Process Completed: Yes Hospitalized since last visit: No Implantable device outside of the clinic excluding No cellular tissue based products placed in the center since last visit: Has Dressing in Place as Prescribed: Yes Pain Present Now: No Electronic Signature(s) Signed: 07/29/2018 9:53:12 AM By: Secundino Ginger Entered By: Secundino Ginger on 07/27/2018 10:15:16 [name] ([id number]) -------------------------------------------------------------------------------- Encounter Discharge Information Details Patient Name: [name]. Date of Service: 07/27/2018 10:15 AM Medical Record Number: [id number] Patient Account Number: [id number] Date of Birth/Sex: 10-Jan-1961 (58 y.o. M) Treating RN: Cornell Barman Primary Care Haroon Shatto: [name] Other Clinician: Referring Lamira Borin: [name] Treating Itzel Mckibbin/Extender: Melburn Hake, HOYT Weeks in Treatment: 1 Encounter Discharge Information Items Post Procedure Vitals Discharge Condition: Stable Temperature (F): 98.1 Ambulatory  Status: Ambulatory Pulse (bpm): 77 Discharge Destination: Home Respiratory Rate (breaths/min): 16 Transportation: Private Auto Blood Pressure (mmHg): 112/77 Accompanied By: friend Schedule Follow-up Appointment: Yes Clinical Summary of Care: Electronic Signature(s) Signed: 07/27/2018 3:49:31 PM By: Gretta Cool, BSN, RN, CWS, Kim RN, BSN Entered By: Gretta Cool, BSN, RN, CWS, Kim on 07/27/2018 10:55:30 [name] ([id number]) -------------------------------------------------------------------------------- Lower Extremity Assessment Details Patient Name: [name], [name]. Date of Service: 07/27/2018 10:15 AM Medical Record Number: [id number] Patient Account Number: [id number] Date of Birth/Sex: [birth year] (58 y.o. M) Treating RN: Secundino Ginger Primary Care Gokul Waybright: [name] Other Clinician: Referring Korrin Waterfield: [name] Treating Jaxsen Bernhart/Extender: Melburn Hake, HOYT Weeks in Treatment: 1 Edema Assessment Assessed: [Left: No] [Right: No] Edema: [Left: N] [Right: o] Calf Left: Right: Point of Measurement: 35 cm From Medial Instep cm 34 cm Ankle Left: Right: Point of Measurement: 13 cm From Medial Instep cm 23 cm Vascular Assessment Claudication: Claudication Assessment [Right:None] Pulses: Dorsalis Pedis Palpable: [Right:Yes] Posterior Tibial Extremity colors, hair growth, and conditions: Extremity Color: [Right:Normal] Hair Growth on Extremity: [Right:Yes] Temperature of Extremity: [Right:Warm] Capillary Refill: [Right:< 3 seconds] Toe Nail Assessment Left: Right: Thick: No Discolored: No Deformed: No Improper Length and Hygiene: No Electronic Signature(s) Signed: 07/29/2018 9:53:12 AM By: Secundino Ginger Entered By: Secundino Ginger on 07/27/2018 10:24:18 [name] ([id number]) -------------------------------------------------------------------------------- Multi Wound Chart Details Patient Name: [name]. Date of Service: 07/27/2018 10:15  AM Medical Record Number: [id number] Patient Account Number: [id number] Date of Birth/Sex: 1961-06-23 (58 y.o. M) Treating RN: Harold Barban Primary Care Shi Grose: [name] Other Clinician: Referring Sudais Banghart: [name] Treating Kauri Garson/Extender: Melburn Hake, HOYT Weeks in Treatment: 1 Vital Signs Height(in): 68 Pulse(bpm): 77 Weight(lbs): 210 Blood Pressure(mmHg): 112/77 Body Mass Index(BMI): 32 Temperature(F): 98.1 Respiratory Rate 16 (breaths/min): Photos: [N/A:N/A] Wound Location: Right Toe Great - Plantar N/A N/A Wounding Event: Blister N/A N/A Primary Etiology: Diabetic Wound/Ulcer of the N/A N/A Lower Extremity Comorbid History: Type II Diabetes N/A N/A Date Acquired: 05/04/2018 N/A N/A Weeks of Treatment: 1 N/A N/A Wound  Status: Open N/A N/A Pending Amputation on Yes N/A N/A Presentation: Measurements L x W x D 1.9x0.9x0.4 N/A N/A (cm) Area (cm) : 1.343 N/A N/A Volume (cm) : 0.537 N/A N/A % Reduction in Area: 35.70% N/A N/A % Reduction in Volume: 35.80% N/A N/A Classification: Grade 1 N/A N/A Exudate Amount: Medium N/A N/A Exudate Type: Serosanguineous N/A N/A Exudate Color: red, brown N/A N/A Wound Margin: Distinct, outline attached N/A N/A Granulation Amount: Small (1-33%) N/A N/A Necrotic Amount: Large (67-100%) N/A N/A Necrotic Tissue: Eschar, Adherent Slough N/A N/A Exposed Structures: Fat Layer (Subcutaneous N/A N/A Tissue) Exposed: Yes Fascia: No Tendon: No Muscle: No [name], [name] ([id number]) Joint: No Bone: No Periwound Skin Texture: Callus: Yes N/A N/A Excoriation: No Induration: No Crepitus: No Rash: No Scarring: No Periwound Skin Moisture: Maceration: Yes N/A N/A Dry/Scaly: No Periwound Skin Color: Atrophie Blanche: No N/A N/A Cyanosis: No Ecchymosis: No Erythema: No Hemosiderin Staining: No Mottled: No Pallor: No Rubor: No Temperature: No Abnormality N/A N/A Tenderness on Palpation: Yes  N/A N/A Wound Preparation: Ulcer Cleansing: N/A N/A Rinsed/Irrigated with Saline Topical Anesthetic Applied: Other: lidocaine 4% Treatment Notes Electronic Signature(s) Signed: 07/27/2018 3:50:02 PM By: Harold Barban Entered By: Harold Barban on 07/27/2018 10:36:59 [name] ([id number]) -------------------------------------------------------------------------------- Anna Details Patient Name: [name], [name]. Date of Service: 07/27/2018 10:15 AM Medical Record Number: [id number] Patient Account Number: [id number] Date of Birth/Sex: 1960/09/19 (58 y.o. M) Treating RN: Harold Barban Primary Care Shetara Launer: [name] Other Clinician: Referring Camelle Henkels: [name] Treating Johnjoseph Rolfe/Extender: Melburn Hake, HOYT Weeks in Treatment: 1 Active Inactive Wound/Skin Impairment Nursing Diagnoses: Impaired tissue integrity Goals: Ulcer/skin breakdown will have a volume reduction of 30% by week 4 Date Initiated: 07/20/2018 Target Resolution Date: 08/20/2018 Goal Status: Active Interventions: Assess patient/caregiver ability to obtain necessary supplies Assess patient/caregiver ability to perform ulcer/skin care regimen upon admission and as needed Notes: Electronic Signature(s) Signed: 07/27/2018 3:50:02 PM By: Harold Barban Entered By: Harold Barban on 07/27/2018 10:36:51 [name] ([id number]) -------------------------------------------------------------------------------- Pain Assessment Details Patient Name: [name]. Date of Service: 07/27/2018 10:15 AM Medical Record Number: [id number] Patient Account Number: [id number] Date of Birth/Sex: 1961/06/03 (58 y.o. M) Treating RN: Secundino Ginger Primary Care Talea Manges: [name] Other Clinician: Referring [name]: [name] Treating Ysabela Keisler/Extender: Melburn Hake, HOYT Weeks in Treatment: 1 Active Problems Location of Pain Severity and  Description of Pain Patient Has Paino No Site Locations Pain Management and Medication Current Pain Management: Notes pt denies any pain at this time. Electronic Signature(s) Signed: 07/29/2018 9:53:12 AM By: Secundino Ginger Entered By: Secundino Ginger on 07/27/2018 10:15:50 [name] ([id number]) -------------------------------------------------------------------------------- Patient/Caregiver Education Details Patient Name: [name], [name]. Date of Service: 07/27/2018 10:15 AM Medical Record Number: [id number] Patient Account Number: [id number] Date of Birth/Gender: 13-Aug-1960 (58 y.o. M) Treating RN: Harold Barban Primary Care Physician: [name] Other Clinician: Referring Physician: [name] Treating Physician/Extender: Sharalyn Ink in Treatment: 1 Education Assessment Education Provided To: Patient Education Topics Provided Wound/Skin Impairment: Handouts: Caring for Your Ulcer Methods: Demonstration, Explain/Verbal Responses: State content correctly Electronic Signature(s) Signed: 07/27/2018 3:50:02 PM By: Harold Barban Entered By: Harold Barban on 07/27/2018 10:38:36 [name] ([id number]) -------------------------------------------------------------------------------- Wound Assessment Details Patient Name: [name]. Date of Service: 07/27/2018 10:15 AM Medical Record Number: [id number] Patient Account Number: [id number] Date of Birth/Sex: 31-Aug-1960 (58 y.o. M) Treating RN: Secundino Ginger Primary Care Shaeleigh Graw: [name] Other Clinician: Referring Prapti Grussing: [name] Treating Marsella Suman/Extender: STONE III, HOYT Weeks in  Treatment: 1 Wound Status Wound Number: 1 Primary Etiology: Diabetic Wound/Ulcer of the Lower Extremity Wound Location: Right Toe Great - Plantar Wound Status: Open Wounding Event: Blister Comorbid Type II Diabetes Date Acquired: 05/04/2018 History: Weeks Of  Treatment: 1 Clustered Wound: No Pending Amputation On Presentation Photos Photo Uploaded By: Secundino Ginger on 07/27/2018 10:27:27 Wound Measurements Length: (cm) 1.9 Width: (cm) 0.9 Depth: (cm) 0.4 Area: (cm) 1.343 Volume: (cm) 0.537 % Reduction in Area: 35.7% % Reduction in Volume: 35.8% Tunneling: No Undermining: No Wound Description Classification: Grade 1 Foul Odor A Wound Margin: Distinct, outline attached Slough/Fibr Exudate Amount: Medium Exudate Type: Serosanguineous Exudate Color: red, brown fter Cleansing: No ino No Wound Bed Granulation Amount: Small (1-33%) Exposed Structure Necrotic Amount: Large (67-100%) Fascia Exposed: No Necrotic Quality: Eschar, Adherent Slough Fat Layer (Subcutaneous Tissue) Exposed: Yes Tendon Exposed: No Muscle Exposed: No Joint Exposed: No Bone Exposed: No Periwound Skin Texture [name], [name]. ([id number]) Texture Color No Abnormalities Noted: No No Abnormalities Noted: No Callus: Yes Atrophie Blanche: No Crepitus: No Cyanosis: No Excoriation: No Ecchymosis: No Induration: No Erythema: No Rash: No Hemosiderin Staining: No Scarring: No Mottled: No Pallor: No Moisture Rubor: No No Abnormalities Noted: No Dry / Scaly: No Temperature / Pain Maceration: Yes Temperature: No Abnormality Tenderness on Palpation: Yes Wound Preparation Ulcer Cleansing: Rinsed/Irrigated with Saline Topical Anesthetic Applied: Other: lidocaine 4%, Treatment Notes Wound #1 (Right, Plantar Toe Great) 1. Cleansed with: Clean wound with Normal Saline 2. Anesthetic Topical Lidocaine 4% cream to wound bed prior to debridement 5. Secondary Dressing Applied Gauze and Kerlix/Conform 7. Secured with Tape Notes [employer] Electronic Signature(s) Signed: 07/29/2018 9:53:12 AM By: Secundino Ginger Entered By: Secundino Ginger on 07/27/2018 10:22:49 [name]  ([id number]) -------------------------------------------------------------------------------- [place] Details Patient Name: [name], [name]. Date of Service: 07/27/2018 10:15 AM Medical Record Number: [id number] Patient Account Number: [id number] Date of Birth/Sex: 08-03-1960 (58 y.o. M) Treating RN: Secundino Ginger Primary Care Natayla Cadenhead: [name] Other Clinician: Referring Faylynn Stamos: [name] Treating Othman Masur/Extender: Melburn Hake, HOYT Weeks in Treatment: 1 Vital Signs Time Taken: 10:15 Temperature (F): 98.1 Height (in): 68 Pulse (bpm): 77 Weight (lbs): 210 Respiratory Rate (breaths/min): 16 Body Mass Index (BMI): 31.9 Blood Pressure (mmHg): 112/77 Reference Range: 80 - 120 mg / dl Electronic Signature(s) Signed: 07/29/2018 9:53:12 AM By: Secundino Ginger Entered By: Secundino Ginger on 07/27/2018 10:16:43

## 2018-07-30 NOTE — Telephone Encounter (Signed)
Error

## 2018-08-03 ENCOUNTER — Encounter: Payer: Medicaid Other | Admitting: Physician Assistant

## 2018-08-03 DIAGNOSIS — L97514 Non-pressure chronic ulcer of other part of right foot with necrosis of bone: Secondary | ICD-10-CM | POA: Diagnosis not present

## 2018-08-03 DIAGNOSIS — Z881 Allergy status to other antibiotic agents status: Secondary | ICD-10-CM | POA: Diagnosis not present

## 2018-08-03 DIAGNOSIS — E11621 Type 2 diabetes mellitus with foot ulcer: Secondary | ICD-10-CM | POA: Diagnosis not present

## 2018-08-03 DIAGNOSIS — L97512 Non-pressure chronic ulcer of other part of right foot with fat layer exposed: Secondary | ICD-10-CM | POA: Diagnosis not present

## 2018-08-04 ENCOUNTER — Encounter: Payer: Self-pay | Admitting: Family Medicine

## 2018-08-04 DIAGNOSIS — E113213 Type 2 diabetes mellitus with mild nonproliferative diabetic retinopathy with macular edema, bilateral: Secondary | ICD-10-CM | POA: Insufficient documentation

## 2018-08-04 NOTE — Progress Notes (Signed)
Harold, [name] ([id number]) Visit Report for 08/03/2018 Chief Complaint Document Details Patient Name: [name], [name]. Date of Service: 08/03/2018 10:15 AM Medical Record Number: [id number] Patient Account Number: [id number] Date of Birth/Sex: [birth year] (58 y.o. M) Treating RN: [name] Primary Care Provider: Nobie [name] Other Clinician: Referring Provider: Nobie [name] Treating Provider/Extender: [name] Weeks in Treatment: 2 Information Obtained from: Patient Chief Complaint Right great toe ulcer Electronic Signature(s) Signed: 08/03/2018 9:58:15 PM By: Harold Keeler PA-C Entered By: [name] on 08/03/2018 10:41:22 Harold, [name] ([id number]) -------------------------------------------------------------------------------- Debridement Details Patient Name: [name]. Date of Service: 08/03/2018 10:15 AM Medical Record Number: [id number] Patient Account Number: [id number] Date of Birth/Sex: 01-03-1961 (58 y.o. M) Treating RN: [name] Primary Care Provider: Nobie [name] Other Clinician: Referring Provider: Nobie [name] Treating Provider/Extender: [name], [name] Weeks in Treatment: 2 Debridement Performed for Wound #1 Right,Plantar Toe Great Assessment: Performed By: Physician STONE [name], Harold Sumler E., PA-C Debridement Type: Debridement Severity of Tissue Pre Fat layer exposed Debridement: Level of Consciousness (Pre- Awake and Alert procedure): Pre-procedure Verification/Time Yes - 10:44 Out Taken: Start Time: 10:44 Pain Control: Lidocaine Total Area Debrided (L x W): 1.9 (cm) x 0.9 (cm) = 1.71 (cm) Tissue and other material Viable, Non-Viable, Slough, Subcutaneous, Slough debrided: Level: Skin/Subcutaneous Tissue Debridement Description: Excisional Instrument: Curette Bleeding: Moderate Hemostasis Achieved: Pressure End Time: 10:46 Procedural Pain: 0 Post Procedural Pain:  0 Response to Treatment: Procedure was tolerated well Level of Consciousness Awake and Alert (Post-procedure): Post Debridement Measurements of Total Wound Length: (cm) 1.9 Width: (cm) 0.9 Depth: (cm) 0.4 Volume: (cm) 0.537 Character of Wound/Ulcer Post Debridement: Improved Severity of Tissue Post Debridement: Fat layer exposed Post Procedure Diagnosis Same as Pre-procedure Electronic Signature(s) Signed: 08/03/2018 5:13:49 PM By: [name] Signed: 08/03/2018 9:58:15 PM By: Harold Keeler PA-C Entered By: [name] on 08/03/2018 10:46:49 [name] ([id number]) -------------------------------------------------------------------------------- HPI Details Patient Name: [name]. Date of Service: 08/03/2018 10:15 AM Medical Record Number: [id number] Patient Account Number: [id number] Date of Birth/Sex: 1960-11-04 (58 y.o. M) Treating RN: [name] Primary Care Provider: Nobie [name] Other Clinician: Referring Provider: Nobie [name] Treating Provider/Extender: [name], [name] Weeks in Treatment: 2 History of Present Illness HPI Description: 07/20/17 on evaluation today patient actually appears to be doing somewhat poorly in regard to his right plantar great toe ulcer. This is something that he states started as what he thought to be a blister May 04, 2018. Subsequently he treated this on his own for a while thinking that it would just get better. As it did not get better he eventually did go to have this evaluated where it was treated appropriately with it sounds like Bactroban ointment however the patient states that it never completely resolved. Subsequently if this continue to get worse and was looking more irritated and inflamed he did go to the hospital more recently where he was prescribed Keflex. Subsequently he was also referred to the wound care center here for further evaluation by [place]. He had an x-ray in the ER which  revealed no evidence of osteomyelitis that I explained to the patient that osteomyelitis can be present even in a negative x-ray as it's not nearly as sensitive as an MRI would be. No fevers, chills, nausea, or vomiting noted at this time. The patient tells me that this might look a little bit better since he started the antibiotics. He does have a history of diabetes mellitus type II he also had a  horse accident years ago and had to have significant surgery in regard to his right lower extremity that did leave some deformity in this regard. This is mainly centered in the shin region. 07/27/18 upon evaluation today patient appears to be doing fairly well in regard to his toe ulcer all things considered. 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 This obviously is good news. With that being said I do think that he did have some infection which obviously calls some of the necrosis although the wound seems to be doing better he is definitely tolerating the Cipro which I switched into based on the bone culture and seems to be dramatically improving in this regard. Overall I'm pleased with how things stand. 08/03/18 on evaluation today patient appears to be doing rather well in regard to his toe ulcer all things considering. Again at this point the patient's wound shows signs of improvement although this is slow it still seems to be doing very well in my pinion. Fortunately there does not seem to show any signs of infection as far as the overall appearance of the wound bed is concerned at least nothing worsening. Overall the redness and erythema has dramatically improved. I'm very pleased. Patient does have his infectious disease consult on February 3 and his MRI is actually scheduled for the 23rd of this month which is just three days away. Electronic  Signature(s) Signed: 08/03/2018 9:58:15 PM By: Harold Keeler PA-C Entered By: [name] on 08/03/2018 11:05:00 [name], [name] ([id number]) -------------------------------------------------------------------------------- Physical Exam Details Patient Name: [name], [name]. Date of Service: 08/03/2018 10:15 AM Medical Record Number: [id number] Patient Account Number: [id number] Date of Birth/Sex: Oct 14, 1960 (58 y.o. M) Treating RN: [name] Primary Care Provider: Nobie [name] Other Clinician: Referring Provider: Nobie [name] Treating Provider/Extender: STONE [name], Saveon Plant Weeks in Treatment: 2 Constitutional Well-nourished and well-hydrated in no acute distress. Respiratory normal breathing without difficulty. Psychiatric this patient is able to make decisions and demonstrates good insight into disease process. Alert and Oriented x 3. pleasant and cooperative. Notes Patient's wound bed currently did require sharp debridement to clear away some of the necrotic material at this point. With that being said we were able to greatly improve the overall appearance of the wound bed post debridement which is excellent news as well. Overall he had no pain with this procedure. Electronic Signature(s) Signed: 08/03/2018 9:58:15 PM By: Harold Keeler PA-C Entered By: [name] on 08/03/2018 11:05:44 [name] ([id number]) -------------------------------------------------------------------------------- Physician Orders Details Patient Name: [name], [name]. Date of Service: 08/03/2018 10:15 AM Medical Record Number: [id number] Patient Account Number: [id number] Date of Birth/Sex: 03-28-1961 (58 y.o. M) Treating RN: [name] Primary Care Provider: Nobie [name] Other Clinician: Referring Provider: Nobie [name] Treating Provider/Extender: [name], Abryana Lykens Weeks in Treatment: 2 Verbal / Phone Orders: No Diagnosis  Coding ICD-10 Coding Code Description E11.621 Type 2 diabetes mellitus with foot ulcer L97.514 Non-pressure chronic ulcer of other part of right foot with necrosis of bone I73.89 Other specified peripheral vascular diseases Wound Cleansing Wound #1 Right,Plantar Toe Great o May Shower, gently pat wound dry prior to applying new dressing. - Dial Antibacterial soap Primary Wound Dressing o Silver Alginate Secondary Dressing o Conform/Kerlix Dressing Change Frequency Wound #1 Right,Plantar Toe Great o Change dressing every other day. Follow-up Appointments Wound #1 Right,Plantar Toe Great o  Return Appointment in 1 week. Off-Loading Wound #1 Right,Plantar Toe Great o Open toe surgical shoe with peg assist. Electronic Signature(s) Signed: 08/03/2018 5:13:49 PM By: [name] Signed: 08/03/2018 9:58:15 PM By: Harold Keeler PA-C Entered By: [name] on 08/03/2018 10:50:01 [name] ([id number]) -------------------------------------------------------------------------------- Problem List Details Patient Name: [name], [name]. Date of Service: 08/03/2018 10:15 AM Medical Record Number: [id number] Patient Account Number: [id number] Date of Birth/Sex: Feb 22, 1961 (58 y.o. M) Treating RN: [name] Primary Care Provider: Nobie [name] Other Clinician: Referring Provider: Nobie [name] Treating Provider/Extender: [name], Jonovan Boedecker Weeks in Treatment: 2 Active Problems ICD-10 Evaluated Encounter Code Description Active Date Today Diagnosis E11.621 Type 2 diabetes mellitus with foot ulcer 07/20/2018 No Yes L97.514 Non-pressure chronic ulcer of other part of right foot with 07/20/2018 No Yes necrosis of bone I73.89 Other specified peripheral vascular diseases 07/20/2018 No Yes Inactive Problems Resolved Problems Electronic Signature(s) Signed: 08/03/2018 9:58:15 PM By: Harold Keeler PA-C Entered By: [name] on 08/03/2018  10:41:18 [name] ([id number]) -------------------------------------------------------------------------------- Progress Note Details Patient Name: [name]. Date of Service: 08/03/2018 10:15 AM Medical Record Number: [id number] Patient Account Number: [id number] Date of Birth/Sex: Jun 02, 1961 (58 y.o. M) Treating RN: [name] Primary Care Provider: Nobie [name] Other Clinician: Referring Provider: Nobie [name] Treating Provider/Extender: [name], Kelby Adell Weeks in Treatment: 2 Subjective Chief Complaint Information obtained from Patient Right great toe ulcer History of Present Illness (HPI) 07/20/17 on evaluation today patient actually appears to be doing somewhat poorly in regard to his right plantar great toe ulcer. This is something that he states started as what he thought to be a blister May 04, 2018. Subsequently he treated this on his own for a while thinking that it would just get better. As it did not get better he eventually did go to have this evaluated where it was treated appropriately with it sounds like Bactroban ointment however the patient states that it never completely resolved. Subsequently if this continue to get worse and was looking more irritated and inflamed he did go to the hospital more recently where he was prescribed Keflex. Subsequently he was also referred to the wound care center here for further evaluation by [place]. He had an x-ray in the ER which revealed no evidence of osteomyelitis that I explained to the patient that osteomyelitis can be present even in a negative x-ray as it's not nearly as sensitive as an MRI would be. No fevers, chills, nausea, or vomiting noted at this time. The patient tells me that this might look a little bit better since he started the antibiotics. He does have a history of diabetes mellitus type II he also had a horse accident years ago and had to have significant surgery in regard to  his right lower extremity that did leave some deformity in this regard. This is mainly centered in the shin region. 07/27/18 upon evaluation today patient appears to be doing fairly well in regard to his toe ulcer all things considered. 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 This obviously is good news. With that being said I do think that he did have some infection which obviously calls some of the necrosis although the wound seems to be doing better he is definitely tolerating the Cipro which I switched into based on the bone culture and seems to be  dramatically improving in this regard. Overall I'm pleased with how things stand. 08/03/18 on evaluation today patient appears to be doing rather well in regard to his toe ulcer all things considering. Again at this point the patient's wound shows signs of improvement although this is slow it still seems to be doing very well in my pinion. Fortunately there does not seem to show any signs of infection as far as the overall appearance of the wound bed is concerned at least nothing worsening. Overall the redness and erythema has dramatically improved. I'm very pleased. Patient does have his infectious disease consult on February 3 and his MRI is actually scheduled for the 23rd of this month which is just three days away. Patient History Information obtained from Patient. Family History Diabetes - Maternal Grandparents, Heart Disease - Mother, No family history of Cancer, Hereditary Spherocytosis, Hypertension, Kidney Disease, Lung Disease, Seizures, Stroke, Thyroid Problems, Tuberculosis. Social History Never smoker, Marital Status - Married, Alcohol Use - Moderate, Drug Use - No History, Caffeine Use - Daily. Review of Systems (ROS) Constitutional Symptoms (General Health) Denies complaints or symptoms of  Fever, Chills. [name], [name]. ([id number]) Respiratory The patient has no complaints or symptoms. Cardiovascular The patient has no complaints or symptoms. Psychiatric The patient has no complaints or symptoms. Objective Constitutional Well-nourished and well-hydrated in no acute distress. Vitals Time Taken: 10:27 AM, Height: 68 in, Weight: 210 lbs, BMI: 31.9, Temperature: 97.7 F, Pulse: 78 bpm, Respiratory Rate: 16 breaths/min, Blood Pressure: 133/81 mmHg. Respiratory normal breathing without difficulty. Psychiatric this patient is able to make decisions and demonstrates good insight into disease process. Alert and Oriented x 3. pleasant and cooperative. General Notes: Patient's wound bed currently did require sharp debridement to clear away some of the necrotic material at this point. With that being said we were able to greatly improve the overall appearance of the wound bed post debridement which is excellent news as well. Overall he had no pain with this procedure. Integumentary (Hair, Skin) Wound #1 status is Open. Original cause of wound was Blister. The wound is located on the [employer]. The wound measures 1.9cm length x 0.9cm width x 0.4cm depth; 1.343cm^2 area and 0.537cm^3 volume. There is Fat Layer (Subcutaneous Tissue) Exposed exposed. There is no tunneling or undermining noted. There is a medium amount of serosanguineous drainage noted. The wound margin is distinct with the outline attached to the wound base. There is no granulation within the wound bed. There is a large (67-100%) amount of necrotic tissue within the wound bed including Eschar and Adherent Slough. The periwound skin appearance exhibited: Callus, Maceration. The periwound skin appearance did not exhibit: Crepitus, Excoriation, Induration, Rash, Scarring, Dry/Scaly, Atrophie Blanche, Cyanosis, Ecchymosis, Hemosiderin Staining, Mottled, Pallor, Rubor, Erythema. Periwound temperature was  noted as No Abnormality. The periwound has tenderness on palpation. Assessment Active Problems ICD-10 Type 2 diabetes mellitus with foot ulcer Non-pressure chronic ulcer of other part of right foot with necrosis of bone Other specified peripheral vascular diseases [name], [name] ([id number]) Procedures Wound #1 Pre-procedure diagnosis of Wound #1 is a Diabetic Wound/Ulcer of the Lower Extremity located on the Right,Plantar Toe Great .Severity of Tissue Pre Debridement is: Fat layer exposed. There was a Excisional Skin/Subcutaneous Tissue Debridement with a total area of 1.71 sq cm performed by STONE [name], Erial Fikes E., PA-C. With the following instrument(s): Curette to remove Viable and Non-Viable tissue/material. Material removed includes Subcutaneous Tissue and Slough and after achieving pain control using Lidocaine. No specimens were  taken. A time out was conducted at 10:44, prior to the start of the procedure. A Moderate amount of bleeding was controlled with Pressure. The procedure was tolerated well with a pain level of 0 throughout and a pain level of 0 following the procedure. Post Debridement Measurements: 1.9cm length x 0.9cm width x 0.4cm depth; 0.537cm^3 volume. Character of Wound/Ulcer Post Debridement is improved. Severity of Tissue Post Debridement is: Fat layer exposed. Post procedure Diagnosis Wound #1: Same as Pre-Procedure Plan Wound Cleansing: Wound #1 Right,Plantar Toe Great: May Shower, gently pat wound dry prior to applying new dressing. - Dial Antibacterial soap Primary Wound Dressing: Silver Alginate Secondary Dressing: Conform/Kerlix Dressing Change Frequency: Wound #1 Right,Plantar Toe Great: Change dressing every other day. Follow-up Appointments: Wound #1 Right,Plantar Toe Great: Return Appointment in 1 week. Off-Loading: Wound #1 Right,Plantar Toe Great: Open toe surgical shoe with peg assist. My suggestion at this time is going to be that we go ahead  and initiate the above wound care measures for the next week. He is in agreement with this plan. This will be a continuation. Subsequently if he is not showing signs of significant improvement between now and then we may need to extend his antibiotic for some time. With that being said overall he's made good signs of improvement week by week thus far and I'm hopeful that this will continue to be the case. He still is bone exposed although it does not appear to be loose or significantly necrotic which is good news. Again the bone culture that we had previous did show bacteria but it fortunately the anabiotic seems to be helpful. Subsequently the pathology revealed that the patient had necrotic bone but no signs of acute osteomyelitis. Please see above for specific wound care orders. We will see patient for re-evaluation in 1 week(s) here in the clinic. If anything worsens or changes patient will contact our office for additional recommendations. [name], [name] ([id number]) Electronic Signature(s) Signed: 08/03/2018 9:58:15 PM By: Harold Keeler PA-C Entered By: [name] on 08/03/2018 11:06:17 [name], [name] ([id number]) -------------------------------------------------------------------------------- ROS/PFSH Details Patient Name: [name], [name]. Date of Service: 08/03/2018 10:15 AM Medical Record Number: [id number] Patient Account Number: [id number] Date of Birth/Sex: Jun 26, 1961 (58 y.o. M) Treating RN: [name] Primary Care Provider: Nobie [name] Other Clinician: Referring Provider: Nobie [name] Treating Provider/Extender: [name], Jaymond Waage Weeks in Treatment: 2 Information Obtained From Patient Wound History Do you currently have one or more open woundso No Have you tested positive for osteomyelitis (bone infection)o No Have you had any tests for circulation on your legso No Have you had other problems associated with your woundso  Swelling Constitutional Symptoms (General Health) Complaints and Symptoms: Negative for: Fever; Chills Hematologic/Lymphatic Medical History: Negative for: Anemia; Hemophilia; Human Immunodeficiency Virus; Lymphedema; Sickle Cell Disease Respiratory Complaints and Symptoms: No Complaints or Symptoms Medical History: Negative for: Aspiration; Asthma; Chronic Obstructive Pulmonary Disease (COPD); Pneumothorax; Sleep Apnea; Tuberculosis Cardiovascular Complaints and Symptoms: No Complaints or Symptoms Gastrointestinal Medical History: Negative for: Cirrhosis ; Colitis; Crohnos; Hepatitis A; Hepatitis B; Hepatitis C Endocrine Medical History: Positive for: Type II Diabetes Negative for: Type I Diabetes Treated with: Insulin Blood sugar tested every day: No Genitourinary [name], [name] ([id number]) Medical History: Negative for: End Stage Renal Disease Immunological Medical History: Negative for: Lupus Erythematosus; Raynaudos; Scleroderma Integumentary (Skin) Medical History: Negative for: History of Burn; History of pressure wounds Musculoskeletal Medical History: Negative for: Gout; Rheumatoid Arthritis; Osteoarthritis; Osteomyelitis Neurologic Medical History: Negative for: Dementia;  Neuropathy; Quadriplegia; Paraplegia; Seizure Disorder Oncologic Medical History: Negative for: Received Chemotherapy; Received Radiation Psychiatric Complaints and Symptoms: No Complaints or Symptoms Medical History: Negative for: Anorexia/bulimia; Confinement Anxiety Immunizations Pneumococcal Vaccine: Received Pneumococcal Vaccination: No Implantable Devices Family and Social History Cancer: No; Diabetes: Yes - Maternal Grandparents; Heart Disease: Yes - Mother; Hereditary Spherocytosis: No; Hypertension: No; Kidney Disease: No; Lung Disease: No; Seizures: No; Stroke: No; Thyroid Problems: No; Tuberculosis: No; Never smoker; Marital Status - Married; Alcohol Use: Moderate;  Drug Use: No History; Caffeine Use: Daily; Financial Concerns: No; Food, Clothing or Shelter Needs: No; Support System Lacking: No; Transportation Concerns: No; Advanced Directives: No; Patient does not want information on Advanced Directives; Do not resuscitate: No; Living Will: No; Medical Power of Attorney: No Physician Affirmation I have reviewed and agree with the above information. Electronic Signature(s) Signed: 08/03/2018 5:13:49 PM By: [name] Signed: 08/03/2018 9:58:15 PM By: Harold Keeler PA-C Entered By: [name] on 08/03/2018 11:05:18 SUFIAN, RAVI ([id number]) IKEY, OMARY ([id number]) -------------------------------------------------------------------------------- SuperBill Details Patient Name: LUZ, BURCHER. Date of Service: 08/03/2018 Medical Record Number: [id number] Patient Account Number: [id number] Date of Birth/Sex: October 27, 1960 (58 y.o. M) Treating RN: [name] Primary Care Provider: Nobie [name] Other Clinician: Referring Provider: Nobie [name] Treating Provider/Extender: [name], Romello Hoehn Weeks in Treatment: 2 Diagnosis Coding ICD-10 Codes Code Description E11.621 Type 2 diabetes mellitus with foot ulcer L97.514 Non-pressure chronic ulcer of other part of right foot with necrosis of bone I73.89 Other specified peripheral vascular diseases Facility Procedures CPT4 Code: [id number] Description: 29191 - DEB SUBQ TISSUE 20 SQ CM/< ICD-10 Diagnosis Description L97.514 Non-pressure chronic ulcer of other part of right foot with ne Modifier: crosis of bone Quantity: 1 Physician Procedures CPT4 Code: 6606004 Description: 59977 - WC PHYS SUBQ TISS 20 SQ CM ICD-10 Diagnosis Description L97.514 Non-pressure chronic ulcer of other part of right foot with ne Modifier: crosis of bone Quantity: 1 Electronic Signature(s) Signed: 08/03/2018 9:58:15 PM By: Harold Keeler PA-C Entered By: [name] on 08/03/2018  11:07:55

## 2018-08-04 NOTE — Progress Notes (Signed)
[name], [name] ([id number]) Visit Report for 08/03/2018 Arrival Information Details Patient Name: [name], [name]. Date of Service: 08/03/2018 10:15 AM Medical Record Number: [id number] Patient Account Number: [id number] Date of Birth/Sex: Feb 03, 1961 (58 y.o. M) Treating RN: Secundino Ginger Primary Care Ghali Morissette: [name] Other Clinician: Referring Alisandra Son: [name] Treating Deicy Rusk/Extender: Melburn Hake, HOYT Weeks in Treatment: 2 Visit Information History Since Last Visit Added or deleted any medications: No Patient Arrived: Ambulatory Any new allergies or adverse reactions: No Arrival Time: 10:23 Had a fall or experienced change in No Accompanied By: family activities of daily living that may affect Transfer Assistance: None risk of falls: Patient Identification Verified: Yes Signs or symptoms of abuse/neglect since last visito No Secondary Verification Process Completed: Yes Hospitalized since last visit: No Implantable device outside of the clinic excluding No cellular tissue based products placed in the center since last visit: Has Dressing in Place as Prescribed: Yes Pain Present Now: No Electronic Signature(s) Signed: 08/03/2018 1:48:10 PM By: Secundino Ginger Entered By: Secundino Ginger on 08/03/2018 10:27:02 [name] ([id number]) -------------------------------------------------------------------------------- Encounter Discharge Information Details Patient Name: [name]. Date of Service: 08/03/2018 10:15 AM Medical Record Number: [id number] Patient Account Number: [id number] Date of Birth/Sex: April 07, 1961 (58 y.o. M) Treating RN: Montey Hora Primary Care Cartez Mogle: [name] Other Clinician: Referring Azizah Lisle: [name] Treating Labrian Torregrossa/Extender: Melburn Hake, HOYT Weeks in Treatment: 2 Encounter Discharge Information Items Post Procedure Vitals Discharge Condition: Stable Temperature (F):  97.7 Ambulatory Status: Ambulatory Pulse (bpm): 78 Discharge Destination: Home Respiratory Rate (breaths/min): 18 Transportation: Private Auto Blood Pressure (mmHg): 134/58 Accompanied By: self Schedule Follow-up Appointment: Yes Clinical Summary of Care: Electronic Signature(s) Signed: 08/03/2018 3:53:01 PM By: Montey Hora Entered By: Montey Hora on 08/03/2018 15:53:00 [name] ([id number]) -------------------------------------------------------------------------------- Lower Extremity Assessment Details Patient Name: [name]. Date of Service: 08/03/2018 10:15 AM Medical Record Number: [id number] Patient Account Number: [id number] Date of Birth/Sex: 1960-12-07 (58 y.o. M) Treating RN: Secundino Ginger Primary Care Victorine Mcnee: [name] Other Clinician: Referring Novella Abraha: [name] Treating Kalyn Hofstra/Extender: Melburn Hake, HOYT Weeks in Treatment: 2 Edema Assessment Assessed: [Left: No] [Right: No] [Left: Edema] [Right: :] Calf Left: Right: Point of Measurement: 35 cm From Medial Instep cm 34 cm Ankle Left: Right: Point of Measurement: 13 cm From Medial Instep cm 22 cm Vascular Assessment Claudication: Claudication Assessment [Right:None] Pulses: Dorsalis Pedis Palpable: [Right:Yes] Posterior Tibial Extremity colors, hair growth, and conditions: Extremity Color: [Right:Normal] Hair Growth on Extremity: [Right:No] Temperature of Extremity: [Right:Warm] Capillary Refill: [Right:< 3 seconds] Toe Nail Assessment Left: Right: Thick: No Discolored: No Deformed: No Electronic Signature(s) Signed: 08/03/2018 1:48:10 PM By: Secundino Ginger Entered By: Secundino Ginger on 08/03/2018 10:34:58 [name] ([id number]) -------------------------------------------------------------------------------- Multi Wound Chart Details Patient Name: [name]. Date of Service: 08/03/2018 10:15 AM Medical Record Number: [id number] Patient Account  Number: [id number] Date of Birth/Sex: 12-27-1960 (58 y.o. M) Treating RN: Harold Barban Primary Care Aly Hauser: [name] Other Clinician: Referring Taunya Goral: [name] Treating Rosealie Reach/Extender: Melburn Hake, HOYT Weeks in Treatment: 2 Vital Signs Height(in): 68 Pulse(bpm): 78 Weight(lbs): 210 Blood Pressure(mmHg): 133/81 Body Mass Index(BMI): 32 Temperature(F): 97.7 Respiratory Rate 16 (breaths/min): Photos: [1:No Photos] [N/A:N/A] Wound Location: [1:Right Toe Great - Plantar] [N/A:N/A] Wounding Event: [1:Blister] [N/A:N/A] Primary Etiology: [1:Diabetic Wound/Ulcer of the Lower Extremity] [N/A:N/A] Comorbid History: [1:Type II Diabetes] [N/A:N/A] Date Acquired: [1:05/04/2018] [N/A:N/A] Weeks of Treatment: [1:2] [N/A:N/A] Wound Status: [1:Open] [N/A:N/A] Pending Amputation on [1:Yes] [N/A:N/A] Presentation: Measurements L x W x D [1:1.9x0.9x0.4] [N/A:N/A] (cm)  Area (cm) : [1:1.343] [N/A:N/A] Volume (cm) : [1:0.537] [N/A:N/A] % Reduction in Area: [1:35.70%] [N/A:N/A] % Reduction in Volume: [1:35.80%] [N/A:N/A] Classification: [1:Grade 1] [N/A:N/A] Exudate Amount: [1:Medium] [N/A:N/A] Exudate Type: [1:Serosanguineous] [N/A:N/A] Exudate Color: [1:red, brown] [N/A:N/A] Wound Margin: [1:Distinct, outline attached] [N/A:N/A] Granulation Amount: [1:None Present (0%)] [N/A:N/A] Necrotic Amount: [1:Large (67-100%)] [N/A:N/A] Necrotic Tissue: [1:Eschar, Adherent Slough] [N/A:N/A] Exposed Structures: [1:Fat Layer (Subcutaneous Tissue) Exposed: Yes Fascia: No Tendon: No Muscle: No Joint: No Bone: No] [N/A:N/A] Periwound Skin Texture: [1:Callus: Yes Excoriation: No Induration: No Crepitus: No] [N/A:N/A] Rash: No Scarring: No Periwound Skin Moisture: Maceration: Yes N/A N/A Dry/Scaly: No Periwound Skin Color: Atrophie Blanche: No N/A N/A Cyanosis: No Ecchymosis: No Erythema: No Hemosiderin Staining: No Mottled: No Pallor: No Rubor:  No Temperature: No Abnormality N/A N/A Tenderness on Palpation: Yes N/A N/A Wound Preparation: Ulcer Cleansing: N/A N/A Rinsed/Irrigated with Saline Topical Anesthetic Applied: Other: lidocaine 4% Treatment Notes Electronic Signature(s) Signed: 08/03/2018 5:13:49 PM By: Harold Barban Entered By: Harold Barban on 08/03/2018 10:44:45 [name] ([id number]) -------------------------------------------------------------------------------- [place] Details Patient Name: DEEN, DEGUIA. Date of Service: 08/03/2018 10:15 AM Medical Record Number: [id number] Patient Account Number: [id number] Date of Birth/Sex: 03-18-1961 (58 y.o. M) Treating RN: Harold Barban Primary Care Lyann Hagstrom: [name] Other Clinician: Referring Camerin Ladouceur: [name] Treating Geovanie Winnett/Extender: Melburn Hake, HOYT Weeks in Treatment: 2 Active Inactive Wound/Skin Impairment Nursing Diagnoses: Impaired tissue integrity Goals: Ulcer/skin breakdown will have a volume reduction of 30% by week 4 Date Initiated: 07/20/2018 Target Resolution Date: 08/20/2018 Goal Status: Active Interventions: Assess patient/caregiver ability to obtain necessary supplies Assess patient/caregiver ability to perform ulcer/skin care regimen upon admission and as needed Notes: Electronic Signature(s) Signed: 08/03/2018 5:13:49 PM By: Harold Barban Entered By: Harold Barban on 08/03/2018 10:44:19 [name] ([id number]) -------------------------------------------------------------------------------- Pain Assessment Details Patient Name: [name]. Date of Service: 08/03/2018 10:15 AM Medical Record Number: [id number] Patient Account Number: [id number] Date of Birth/Sex: [birth year] (58 y.o. M) Treating RN: Secundino Ginger Primary Care Nashae Maudlin: [name] Other Clinician: Referring Tilla Wilborn: [name] Treating Kimiyah Blick/Extender: Melburn Hake,  HOYT Weeks in Treatment: 2 Active Problems Location of Pain Severity and Description of Pain Patient Has Paino No Site Locations Pain Management and Medication Current Pain Management: Notes pt denies pain. stated sensation returning to foot. Electronic Signature(s) Signed: 08/03/2018 1:48:10 PM By: Secundino Ginger Entered By: Secundino Ginger on 08/03/2018 10:27:51 [name] ([id number]) -------------------------------------------------------------------------------- Patient/Caregiver Education Details Patient Name: JEANMARC, VIERNES. Date of Service: 08/03/2018 10:15 AM Medical Record Number: [id number] Patient Account Number: [id number] Date of Birth/Gender: [birth year] (58 y.o. M) Treating RN: Harold Barban Primary Care Physician: [name] Other Clinician: Referring Physician: [name] Treating Physician/Extender: Sharalyn Ink in Treatment: 2 Education Assessment Education Provided To: Patient Education Topics Provided Wound/Skin Impairment: Handouts: Caring for Your Ulcer Methods: Demonstration, Explain/Verbal Responses: State content correctly Electronic Signature(s) Signed: 08/03/2018 5:13:49 PM By: Harold Barban Entered By: Harold Barban on 08/03/2018 10:47:14 [name] ([id number]) -------------------------------------------------------------------------------- Wound Assessment Details Patient Name: [name]. Date of Service: 08/03/2018 10:15 AM Medical Record Number: [id number] Patient Account Number: [id number] Date of Birth/Sex: 1961-07-04 (58 y.o. M) Treating RN: Secundino Ginger Primary Care Brandyn Lowrey: [name] Other Clinician: Referring Zaylei Mullane: [name] Treating Deboraha Goar/Extender: STONE III, HOYT Weeks in Treatment: 2 Wound Status Wound Number: 1 Primary Etiology: Diabetic Wound/Ulcer of the Lower Extremity Wound Location: Right Toe Great - Plantar Wound Status: Open Wounding  Event: Blister Comorbid Type II Diabetes Date Acquired: 05/04/2018 History:  Weeks Of Treatment: 2 Clustered Wound: No Pending Amputation On Presentation Photos Photo Uploaded By: Secundino Ginger on 08/03/2018 11:12:33 Wound Measurements Length: (cm) 1.9 Width: (cm) 0.9 Depth: (cm) 0.4 Area: (cm) 1.343 Volume: (cm) 0.537 % Reduction in Area: 35.7% % Reduction in Volume: 35.8% Tunneling: No Undermining: No Wound Description Classification: Grade 1 Foul Odor A Wound Margin: Distinct, outline attached Slough/Fibr Exudate Amount: Medium Exudate Type: Serosanguineous Exudate Color: red, brown fter Cleansing: No ino No Wound Bed Granulation Amount: None Present (0%) Exposed Structure Necrotic Amount: Large (67-100%) Fascia Exposed: No Necrotic Quality: Eschar, Adherent Slough Fat Layer (Subcutaneous Tissue) Exposed: Yes Tendon Exposed: No Muscle Exposed: No Joint Exposed: No Bone Exposed: No Periwound Skin Texture SULO, JANCZAK. ([id number]) Texture Color No Abnormalities Noted: No No Abnormalities Noted: No Callus: Yes Atrophie Blanche: No Crepitus: No Cyanosis: No Excoriation: No Ecchymosis: No Induration: No Erythema: No Rash: No Hemosiderin Staining: No Scarring: No Mottled: No Pallor: No Moisture Rubor: No No Abnormalities Noted: No Dry / Scaly: No Temperature / Pain Maceration: Yes Temperature: No Abnormality Tenderness on Palpation: Yes Wound Preparation Ulcer Cleansing: Rinsed/Irrigated with Saline Topical Anesthetic Applied: Other: lidocaine 4%, Treatment Notes Wound #1 (Right, Plantar Toe Great) Notes [facility] Electronic Signature(s) Signed: 08/03/2018 1:48:10 PM By: Secundino Ginger Entered By: Secundino Ginger on 08/03/2018 10:32:58 [name] ([id number]) -------------------------------------------------------------------------------- Vitals Details Patient Name: [name]. Date of Service: 08/03/2018 10:15 AM Medical Record  Number: [id number] Patient Account Number: [id number] Date of Birth/Sex: Nov 28, 1960 (58 y.o. M) Treating RN: Secundino Ginger Primary Care Lakai Moree: [name] Other Clinician: Referring Ariane Ditullio: [name] Treating Daeshawn Redmann/Extender: Melburn Hake, HOYT Weeks in Treatment: 2 Vital Signs Time Taken: 10:27 Temperature (F): 97.7 Height (in): 68 Pulse (bpm): 78 Weight (lbs): 210 Respiratory Rate (breaths/min): 16 Body Mass Index (BMI): 31.9 Blood Pressure (mmHg): 133/81 Reference Range: 80 - 120 mg / dl Electronic Signature(s) Signed: 08/03/2018 1:48:10 PM By: Secundino Ginger Entered BySecundino Ginger on 08/03/2018 54:62:70

## 2018-08-06 ENCOUNTER — Ambulatory Visit
Admission: RE | Admit: 2018-08-06 | Discharge: 2018-08-06 | Disposition: A | Discharge status: Home / Self Care | Payer: Medicaid Other | Source: Ambulatory Visit | Attending: Physician Assistant | Admitting: Physician Assistant

## 2018-08-06 DIAGNOSIS — L97514 Non-pressure chronic ulcer of other part of right foot with necrosis of bone: Secondary | ICD-10-CM

## 2018-08-06 DIAGNOSIS — E11621 Type 2 diabetes mellitus with foot ulcer: Secondary | ICD-10-CM | POA: Diagnosis not present

## 2018-08-06 MED ORDER — GADOBUTROL 1 MMOL/ML IV SOLN
9.0000 mL | Freq: Once | INTRAVENOUS | Status: AC | PRN
  Administered 2018-08-06: 9 mL via INTRAVENOUS

## 2018-08-10 ENCOUNTER — Encounter: Payer: Medicaid Other | Admitting: Physician Assistant

## 2018-08-10 DIAGNOSIS — E11621 Type 2 diabetes mellitus with foot ulcer: Secondary | ICD-10-CM | POA: Diagnosis not present

## 2018-08-10 DIAGNOSIS — Z881 Allergy status to other antibiotic agents status: Secondary | ICD-10-CM | POA: Diagnosis not present

## 2018-08-10 DIAGNOSIS — L97514 Non-pressure chronic ulcer of other part of right foot with necrosis of bone: Secondary | ICD-10-CM | POA: Diagnosis not present

## 2018-08-11 NOTE — Progress Notes (Signed)
[name] ([id number]) Visit Report for 08/10/2018 Arrival Information Details Patient Name: [name], [name]. Date of Service: 08/10/2018 10:15 AM Medical Record Number: [id number] Patient Account Number: [id number] Date of Birth/Sex: [birth year] (58 y.o. M) Treating RN: [name] Primary Care [name]: [name] Other Clinician: Referring [name]: [name] Treating [name]/Extender: [name], [name]: 3 Visit Information History Since Last Visit Added or deleted any medications: No Patient Arrived: Ambulatory Any new allergies or adverse reactions: No Arrival Time: 10:14 Had a fall or experienced change in No Accompanied By: wife activities of daily living that may affect Transfer Assistance: None risk of falls: Patient Identification Verified: Yes Signs or symptoms of abuse/neglect since last No Secondary Verification Process Completed: Yes visito Hospitalized since last visit: No Implantable device outside of the clinic No excluding cellular tissue based products placed in the center since last visit: Has Dressing in Place as Prescribed: Yes Has Footwear/Offloading in Place as Yes Prescribed: Right: Surgical Shoe with Pressure Relief Insole Pain Present Now: No Electronic Signature(s) Signed: 08/10/2018 2:36:35 PM By: Lorine Bears RCP, RRT, CHT Entered By: Lorine Bears on 08/10/2018 10:14:49 [name] ([id number]) -------------------------------------------------------------------------------- Encounter Discharge Information Details Patient Name: [name], [name]. Date of Service: 08/10/2018 10:15 AM Medical Record Number: [id number] Patient Account Number: [id number] Date of Birth/Sex: 07/26/1960 (58 y.o. M) Treating RN: [name] Primary Care [name]: [name] Other Clinician: Referring [name]: [name] Treating Demecia Northway/Extender: [name], [name]: 3 Encounter Discharge Information Items Post Procedure Vitals Discharge Condition: Stable Temperature (F): 97.5 Ambulatory Status: Ambulatory Pulse (bpm): 74 Discharge Destination: Home Respiratory Rate (breaths/min): 16 Transportation: Private Auto Blood Pressure (mmHg): 125/66 Accompanied By: self Schedule Follow-up Appointment: Yes Clinical Summary of Care: Electronic Signature(s) Signed: 08/10/2018 4:45:46 PM By: Gretta Cool, BSN, RN, CWS, Kim RN, BSN Entered By: Gretta Cool, BSN, RN, CWS, Kim on 08/10/2018 10:51:48 [name] ([id number]) -------------------------------------------------------------------------------- Lower Extremity Assessment Details Patient Name: [name]. Date of Service: 08/10/2018 10:15 AM Medical Record Number: [id number] Patient Account Number: [id number] Date of Birth/Sex: 12-28-1960 (58 y.o. M) Treating RN: [name] Primary Care Ramiel Forti: [name] Other Clinician: Referring [name]: [name] Treating Donovin Kraemer/Extender: [name], [name]: 3 Edema Assessment Assessed: [Left: No] [Right: No] [Left: Edema] [Right: :] Calf Left: Right: Point of Measurement: 35 cm From Medial Instep cm 35 cm Ankle Left: Right: Point of Measurement: 13 cm From Medial Instep cm 22 cm Vascular Assessment Claudication: Claudication Assessment [Right:None] Pulses: Dorsalis Pedis Palpable: [Right:Yes] Posterior Tibial Extremity colors, hair growth, and conditions: Extremity Color: [Right:Normal] Hair Growth on Extremity: [Right:Yes] Temperature of Extremity: [Right:Warm] Capillary Refill: [Right:< 3 seconds] Toe Nail Assessment Left: Right: Thick: Yes Discolored: No Deformed: No Improper Length and Hygiene: No Electronic Signature(s) Signed: 08/10/2018 2:52:40 PM By: [name] Entered By: [name] on 08/10/2018 10:26:22 [name]  ([id number]) -------------------------------------------------------------------------------- Multi Wound Chart Details Patient Name: [name]. Date of Service: 08/10/2018 10:15 AM Medical Record Number: [id number] Patient Account Number: [id number] Date of Birth/Sex: [birth year] (58 y.o. M) Treating RN: [name] Primary Care [name]: [name] Other Clinician: Referring [name]: [name] Treating Devani Odonnel/Extender: [name], [name]: 3 Vital Signs Height(in): 68 Pulse(bpm): 74 Weight(lbs): 210 Blood Pressure(mmHg): 125/66 Body Mass Index(BMI): 32 Temperature(F): 97.5 Respiratory Rate 16 (breaths/min): Photos: [1:No Photos] [N/A:N/A] Wound Location: [1:Right Toe Great - Plantar] [N/A:N/A] Wounding Event: [1:Blister] [N/A:N/A] Primary Etiology: [1:Diabetic Wound/Ulcer of the Lower Extremity] [N/A:N/A] Comorbid History: [  1:Type II Diabetes] [N/A:N/A] Date Acquired: [1:05/04/2018] [N/A:N/A] Weeks of [name]: [1:3] [N/A:N/A] Wound Status: [1:Open] [N/A:N/A] Pending Amputation on [1:Yes] [N/A:N/A] Presentation: Measurements L x W x D [1:1.9x0.9x0.4] [N/A:N/A] (cm) Area (cm) : [1:1.343] [N/A:N/A] Volume (cm) : [1:0.537] [N/A:N/A] % Reduction in Area: [1:35.70%] [N/A:N/A] % Reduction in Volume: [1:35.80%] [N/A:N/A] Classification: [1:Grade 1] [N/A:N/A] Exudate Amount: [1:Small] [N/A:N/A] Exudate Type: [1:Serous] [N/A:N/A] Exudate Color: [1:amber] [N/A:N/A] Wound Margin: [1:Distinct, outline attached] [N/A:N/A] Granulation Amount: [1:Small (1-33%)] [N/A:N/A] Granulation Quality: [1:Pink] [N/A:N/A] Necrotic Amount: [1:Small (1-33%)] [N/A:N/A] Necrotic Tissue: [1:Eschar, Adherent Slough] [N/A:N/A] Exposed Structures: [1:Fat Layer (Subcutaneous Tissue) Exposed: Yes Fascia: No Tendon: No Muscle: No Joint: No Bone: No] [N/A:N/A] Periwound Skin Texture: [1:Callus: Yes Excoriation: No Induration: No Crepitus: No]  [N/A:N/A] Rash: No Scarring: No Periwound Skin Moisture: Maceration: Yes N/A N/A Dry/Scaly: No Periwound Skin Color: Atrophie Blanche: No N/A N/A Cyanosis: No Ecchymosis: No Erythema: No Hemosiderin Staining: No Mottled: No Pallor: No Rubor: No Temperature: No Abnormality N/A N/A Tenderness on Palpation: Yes N/A N/A Wound Preparation: Ulcer Cleansing: N/A N/A Rinsed/Irrigated with Saline Topical Anesthetic Applied: Other: lidocaine 4% [name] Notes Electronic Signature(s) Signed: 08/10/2018 4:45:46 PM By: Gretta Cool, BSN, RN, CWS, Kim RN, BSN Entered By: Gretta Cool, BSN, RN, CWS, Kim on 08/10/2018 10:45:16 [name] ([id number]) -------------------------------------------------------------------------------- [place] Details Patient Name: [name], [name]. Date of Service: 08/10/2018 10:15 AM Medical Record Number: [id number] Patient Account Number: [id number] Date of Birth/Sex: 07/08/1961 (58 y.o. M) Treating RN: [name] Primary Care Kamran Coker: [name] Other Clinician: Referring Amillion Scobee: [name] Treating Keyonda Bickle/Extender: [name], [name]: 3 Active Inactive Wound/Skin Impairment Nursing Diagnoses: Impaired tissue integrity Goals: Ulcer/skin breakdown will have a volume reduction of 30% by week 4 Date Initiated: 07/20/2018 Target Resolution Date: 08/20/2018 Goal Status: Active Interventions: Assess patient/caregiver ability to obtain necessary supplies Assess patient/caregiver ability to perform ulcer/skin care regimen upon admission and as needed Notes: Electronic Signature(s) Signed: 08/10/2018 4:45:46 PM By: Gretta Cool, BSN, RN, CWS, Kim RN, BSN Entered By: Gretta Cool, BSN, RN, CWS, Kim on 08/10/2018 10:45:06 [name] ([id number]) -------------------------------------------------------------------------------- Pain Assessment Details Patient Name: [name]. Date of Service:  08/10/2018 10:15 AM Medical Record Number: [id number] Patient Account Number: [id number] Date of Birth/Sex: [birth year] (58 y.o. M) Treating RN: [name] Primary Care Vonnie Spagnolo: [name] Other Clinician: Referring Esmeralda Malay: [name] Treating Breeanna Galgano/Extender: [name], [name]: 3 Active Problems Location of Pain Severity and Description of Pain Patient Has Paino No Site Locations Pain Management and Medication Current Pain Management: Electronic Signature(s) Signed: 08/10/2018 2:36:35 PM By: Lorine Bears RCP, RRT, CHT Signed: 08/10/2018 4:45:46 PM By: Gretta Cool, BSN, RN, CWS, Kim RN, BSN Entered By: Lorine Bears on 08/10/2018 10:14:56 [name] ([id number]) -------------------------------------------------------------------------------- Patient/Caregiver Education Details Patient Name: [name], [name]. Date of Service: 08/10/2018 10:15 AM Medical Record Number: [id number] Patient Account Number: [id number] Date of Birth/Gender: [birth year] (58 y.o. M) Treating RN: [name] Primary Care Physician: [name] Other Clinician: Referring Physician: Nobie [name] Treating Physician/Extender: Sharalyn Ink in [name]: 3 Education Assessment Education Provided To: Patient Education Topics Provided Wound/Skin Impairment: Handouts: Caring for Your Ulcer Methods: Demonstration, Explain/Verbal Responses: State content correctly Electronic Signature(s) Signed: 08/10/2018 4:45:46 PM By: Gretta Cool, BSN, RN, CWS, Kim RN, BSN Entered By: Gretta Cool, BSN, RN, CWS, Kim on 08/10/2018 10:50:09 [name] ([id number]) -------------------------------------------------------------------------------- Wound Assessment Details Patient Name: [name], [name]. Date of Service: 08/10/2018 10:15 AM Medical Record Number: [id number] Patient Account Number: [id number]  Date of Birth/Sex: 09/12/1960  (58 y.o. M) Treating RN: [name] Primary Care Jakylan Ron: [name] Other Clinician: Referring Brenden Rudman: [name] Treating Yvonne Petite/Extender: [name], [name]: 3 Wound Status Wound Number: 1 Primary Etiology: Diabetic Wound/Ulcer of the Lower Extremity Wound Location: Right Toe Great - Plantar Wound Status: Open Wounding Event: Blister Comorbid Type II Diabetes Date Acquired: 05/04/2018 History: Weeks Of [name]: 3 Clustered Wound: No Pending Amputation On Presentation Photos Photo Uploaded By: [name] on 08/10/2018 10:55:06 Wound Measurements Length: (cm) 1.9 Width: (cm) 0.9 Depth: (cm) 0.4 Area: (cm) 1.343 Volume: (cm) 0.537 % Reduction in Area: 35.7% % Reduction in Volume: 35.8% Tunneling: No Undermining: No Wound Description Classification: Grade 1 Foul Odor A Wound Margin: Distinct, outline attached Slough/Fibr Exudate Amount: Small Exudate Type: Serous Exudate Color: amber fter Cleansing: No ino No Wound Bed Granulation Amount: Small (1-33%) Exposed Structure Granulation Quality: Pink Fascia Exposed: No Necrotic Amount: Small (1-33%) Fat Layer (Subcutaneous Tissue) Exposed: Yes Necrotic Quality: Eschar, Adherent Slough Tendon Exposed: No Muscle Exposed: No Joint Exposed: No Bone Exposed: No Periwound Skin Texture [name], [name]. ([id number]) Texture Color No Abnormalities Noted: No No Abnormalities Noted: No Callus: Yes Atrophie Blanche: No Crepitus: No Cyanosis: No Excoriation: No Ecchymosis: No Induration: No Erythema: No Rash: No Hemosiderin Staining: No Scarring: No Mottled: No Pallor: No Moisture Rubor: No No Abnormalities Noted: No Dry / Scaly: No Temperature / Pain Maceration: Yes Temperature: No Abnormality Tenderness on Palpation: Yes Wound Preparation Ulcer Cleansing: Rinsed/Irrigated with Saline Topical Anesthetic Applied: Other: lidocaine 4%, [name] Notes Wound  #1 (Right, Plantar Toe Great) Notes [employer] Electronic Signature(s) Signed: 08/10/2018 2:52:40 PM By: [name] Entered By: [name] on 08/10/2018 10:25:06 [name] ([id number]) -------------------------------------------------------------------------------- Vitals Details Patient Name: [name]. Date of Service: 08/10/2018 10:15 AM Medical Record Number: [id number] Patient Account Number: [id number] Date of Birth/Sex: Dec 01, 1960 (58 y.o. M) Treating RN: [name] Primary Care Geraldyne Barraclough: [name] Other Clinician: Referring Najee Manninen: [name] Treating Joycie Aerts/Extender: [name], [name]: 3 Vital Signs Time Taken: 10:14 Temperature (F): 97.5 Height (in): 68 Pulse (bpm): 74 Weight (lbs): 210 Respiratory Rate (breaths/min): 16 Body Mass Index (BMI): 31.9 Blood Pressure (mmHg): 125/66 Reference Range: 80 - 120 mg / dl Airway Electronic Signature(s) Signed: 08/10/2018 2:36:35 PM By: Lorine Bears RCP, RRT, CHT Entered By: Lorine Bears on 08/10/2018 10:17:13

## 2018-08-11 NOTE — Progress Notes (Signed)
[name] ([id number]) Visit Report for 08/10/2018 Chief Complaint Document Details Patient Name: [name], [name]. Date of Service: 08/10/2018 10:15 AM Medical Record Number: [id number] Patient Account Number: [id number] Date of Birth/Sex: 01-04-1961 (58 y.o. M) Treating RN: Cornell Barman Primary Care Provider: [name] Other Clinician: Referring Provider: [name] Treating Provider/Extender: Melburn Hake, Sabreen Kitchen Weeks in Treatment: 3 Information Obtained from: Patient Chief Complaint Right great toe ulcer Electronic Signature(s) Signed: 08/10/2018 12:22:59 PM By: Worthy Keeler PA-C Entered By: Worthy Keeler on 08/10/2018 12:08:21 [name], [name] ([id number]) -------------------------------------------------------------------------------- Debridement Details Patient Name: [name]. Date of Service: 08/10/2018 10:15 AM Medical Record Number: [id number] Patient Account Number: [id number] Date of Birth/Sex: [birth year] (58 y.o. M) Treating RN: Cornell Barman Primary Care Provider: [name] Other Clinician: Referring Provider: [name] Treating Provider/Extender: Melburn Hake, Fairley Copher Weeks in Treatment: 3 Debridement Performed for Wound #1 Right,Plantar Toe Great Assessment: Performed By: Physician STONE III, Choua Ikner E., PA-C Debridement Type: Debridement Severity of Tissue Pre Bone involvement without necrosis Debridement: Level of Consciousness (Pre- Awake and Alert procedure): Pre-procedure Verification/Time Yes - 10:44 Out Taken: Start Time: 10:45 Pain Control: Lidocaine Total Area Debrided (L x W): 1.9 (cm) x 0.9 (cm) = 1.71 (cm) Tissue and other material Viable, Non-Viable, Bone, Slough, Subcutaneous, Slough debrided: Level: Skin/Subcutaneous Tissue/Muscle/Bone Debridement Description: Excisional Instrument: Curette Bleeding: Minimum Hemostasis Achieved: Pressure Response to Treatment: Procedure was  tolerated well Level of Consciousness Awake and Alert (Post-procedure): Post Debridement Measurements of Total Wound Length: (cm) 1.9 Width: (cm) 9 Depth: (cm) 0.5 Volume: (cm) 6.715 Character of Wound/Ulcer Post Debridement: Stable Severity of Tissue Post Debridement: Necrosis of bone Post Procedure Diagnosis Same as Pre-procedure Electronic Signature(s) Signed: 08/10/2018 12:22:59 PM By: Worthy Keeler PA-C Signed: 08/10/2018 4:45:46 PM By: Gretta Cool, BSN, RN, CWS, Kim RN, BSN Entered By: Gretta Cool, BSN, RN, CWS, Kim on 08/10/2018 10:49:41 [name], [name] ([id number]) -------------------------------------------------------------------------------- HPI Details Patient Name: [name]. Date of Service: 08/10/2018 10:15 AM Medical Record Number: [id number] Patient Account Number: [id number] Date of Birth/Sex: 1961-06-30 (58 y.o. M) Treating RN: Cornell Barman Primary Care Provider: [name] Other Clinician: Referring Provider: [name] Treating Provider/Extender: Melburn Hake, Rodina Pinales Weeks in Treatment: 3 History of Present Illness HPI Description: 07/20/17 on evaluation today patient actually appears to be doing somewhat poorly in regard to his right plantar great toe ulcer. This is something that he states started as what he thought to be a blister May 04, 2018. Subsequently he treated this on his own for a while thinking that it would just get better. As it did not get better he eventually did go to have this evaluated where it was treated appropriately with it sounds like Bactroban ointment however the patient states that it never completely resolved. Subsequently if this continue to get worse and was looking more irritated and inflamed he did go to the hospital more recently where he was prescribed Keflex. Subsequently he was also referred to the wound care center here for further evaluation by [place]. He had an x-ray in the ER which revealed no evidence of  osteomyelitis that I explained to the patient that osteomyelitis can be present even in a negative x-ray as it's not nearly as sensitive as an MRI would be. No fevers, chills, nausea, or vomiting noted at this time. The patient tells me that this might look a little bit better since he started the antibiotics. He does have a history of diabetes mellitus type II he also had a  horse accident years ago and had to have significant surgery in regard to his right lower extremity that did leave some deformity in this regard. This is mainly centered in the shin region. 07/27/18 upon evaluation today patient appears to be doing fairly well in regard to his toe ulcer all things considered. 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 This obviously is good news. With that being said I do think that he did have some infection which obviously calls some of the necrosis although the wound seems to be doing better he is definitely tolerating the Cipro which I switched into based on the bone culture and seems to be dramatically improving in this regard. Overall I'm pleased with how things stand. 08/03/18 on evaluation today patient appears to be doing rather well in regard to his toe ulcer all things considering. Again at this point the patient's wound shows signs of improvement although this is slow it still seems to be doing very well in my pinion. Fortunately there does not seem to show any signs of infection as far as the overall appearance of the wound bed is concerned at least nothing worsening. Overall the redness and erythema has dramatically improved. I'm very pleased. Patient does have his infectious disease consult on February 3 and his MRI is actually scheduled for the 23rd of this month which is just three days away. 08/10/18 on evaluation today patient actually  appears to be doing okay in regard to his toe ulcer. It does not appear to be as inflamed as it was previous. 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 He still has bone exposed in the base of the wound. I am concerned about how well this is actually going to heal or not at this point. Overall I do believe that the Cipro has been of benefit for him and I will continue this one more week until he sees infectious disease next Monday. I'm also gonna see about however getting appointment for him to see podiatry to discuss his options. Electronic Signature(s) Signed: 08/10/2018 12:22:59 PM By: Worthy Keeler PA-C Entered By: Worthy Keeler on 08/10/2018 12:13:35 [name], [name] ([id number]) -------------------------------------------------------------------------------- Physical Exam Details Patient Name: [name], [name]. Date of Service: 08/10/2018 10:15 AM Medical Record Number: [id number] Patient Account Number: [id number] Date of Birth/Sex: Jan 20, 1961 (58 y.o. M) Treating RN: Cornell Barman Primary Care Provider: [name] Other Clinician: Referring Provider: [name] Treating Provider/Extender: Melburn Hake, Yechezkel Fertig Weeks in Treatment: 3 Constitutional Well-nourished and well-hydrated in no acute distress. Respiratory normal breathing without difficulty. Psychiatric this patient is able to make decisions and demonstrates good insight into disease process. Alert and Oriented x 3. pleasant and cooperative. Notes Patient's wound bed currently shows signs of still having bone noted in the base of the wound some which was necrotic it was debrided away today. Fortunately the infection seems to be for the most part improving which is good news  nonetheless it sounds this does still have some significant underlying structural abnormalities noted on MRI. Electronic Signature(s) Signed: 08/10/2018 12:22:59 PM By: Worthy Keeler PA-C Entered By: Worthy Keeler  on 08/10/2018 12:19:54 [name], [name] ([id number]) -------------------------------------------------------------------------------- Physician Orders Details Patient Name: [name], [name]. Date of Service: 08/10/2018 10:15 AM Medical Record Number: [id number] Patient Account Number: [id number] Date of Birth/Sex: 22-Apr-1961 (58 y.o. M) Treating RN: Cornell Barman Primary Care Provider: [name] Other Clinician: Referring Provider: [name] Treating Provider/Extender: Melburn Hake, Lilibeth Opie Weeks in Treatment: 3 Verbal / Phone Orders: No Diagnosis Coding Wound Cleansing Wound #1 Right,Plantar Toe Great o May Shower, gently pat wound dry prior to applying new dressing. - Dial Antibacterial soap Anesthetic (add to Medication List) Wound #1 Right,Plantar Toe Great o Topical Lidocaine 4% cream applied to wound bed prior to debridement (In Clinic Only). Primary Wound Dressing o Silver Alginate Secondary Dressing o Conform/Kerlix Dressing Change Frequency Wound #1 Right,Plantar Toe Great o Change dressing every other day. Follow-up Appointments Wound #1 Right,Plantar Toe Great o Return Appointment in 1 week. Off-Loading Wound #1 Right,Plantar Toe Great o Open toe surgical shoe with peg assist. Medications-please add to medication list. Wound #1 Right,Plantar Toe Great o P.O. Antibiotics - Continue antibiotics Consults o Podiatry - [facility] Referral Patient Medications Allergies: No Known Allergies Notifications Medication Indication Start End Cipro 08/10/2018 DOSE 1 - oral 500 mg tablet - 1 tablet oral taken 2 times a day for 7 days [name], [name] ([id number]) Electronic Signature(s) Signed: 08/10/2018 12:21:36 PM By: Worthy Keeler PA-C Entered By: Worthy Keeler on 08/10/2018 12:21:35 [name], [name] ([id number]) -------------------------------------------------------------------------------- Problem List Details Patient Name: [name], [name]. Date of Service: 08/10/2018 10:15 AM Medical Record Number: [id number] Patient Account Number: [id number] Date of Birth/Sex: [birth year] (58 y.o. 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 Date of Service: 08/10/2018 10:15 AM Medical Record Number: [id number] Patient Account Number: [id number] Date of Birth/Sex: May 04, 1961 (58 y.o. M) Treating RN: Cornell Barman Primary Care Provider: [name] Other Clinician: Referring Provider: [name] Treating Provider/Extender: Melburn Hake, Angelic Schnelle Weeks in Treatment: 3 Subjective Chief Complaint Information obtained from Patient Right great toe ulcer History of Present Illness (HPI) 07/20/17 on evaluation today patient actually appears to be doing somewhat poorly in regard to his right plantar great toe ulcer. This is something that he states started as what he thought to be a blister May 04, 2018.  Subsequently he treated this on his own for a while thinking that it would just get better. As it did not get better he eventually did go to have this evaluated where it was treated appropriately with it sounds like Bactroban ointment however the patient states that it never completely resolved. Subsequently if this continue to get worse and was looking more irritated and inflamed he did go to the hospital more recently where he was prescribed Keflex. Subsequently he was also referred to the wound care center here for further evaluation by [place]. He had an x-ray in the ER which revealed no evidence of osteomyelitis that I explained to the  patient that osteomyelitis can be present even in a negative x-ray as it's not nearly as sensitive as an MRI would be. No fevers, chills, nausea, or vomiting noted at this time. The patient tells me that this might look a little bit better since he started the antibiotics. He does have a history of diabetes mellitus type II he also had a horse accident years ago and had to have significant surgery in regard to his right lower extremity that did leave some deformity in this regard. This is mainly centered in the shin region. 07/27/18 upon evaluation today patient appears to be doing fairly well in regard to his toe ulcer all things considered. 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 This obviously is good news. With that being said I do think that he did have some infection which obviously calls some of the necrosis although the wound seems to be doing better he is definitely tolerating the Cipro which I switched into based on the bone culture and seems to be dramatically improving in this regard. Overall I'm pleased with how things stand. 08/03/18 on evaluation today patient appears to be doing rather well in regard to his toe  ulcer all things considering. Again at this point the patient's wound shows signs of improvement although this is slow it still seems to be doing very well in my pinion. Fortunately there does not seem to show any signs of infection as far as the overall appearance of the wound bed is concerned at least nothing worsening. Overall the redness and erythema has dramatically improved. I'm very pleased. Patient does have his infectious disease consult on February 3 and his MRI is actually scheduled for the 23rd of this month which is just three days away. 08/10/18 on evaluation today patient actually appears to be doing okay in regard to his toe ulcer. It does not appear to be as inflamed as it was previous. 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 He still has bone exposed in the base of the wound. I am concerned about how well this is actually going to heal or not at this point. Overall I do believe that the Cipro has been of benefit for him and I will continue this one more week until he sees infectious disease next Monday. I'm also gonna see about however getting appointment for him to see podiatry to discuss his options. Patient History Information obtained from Patient. Family History Diabetes - Maternal Grandparents, Heart Disease - Mother, No family history of Cancer, Hereditary Spherocytosis, Hypertension, Kidney Disease, Lung Disease, Seizures, Stroke, INA, POUPARD. ([id number]) Thyroid Problems, Tuberculosis. Social History Never smoker, Marital Status - Married, Alcohol Use - Moderate, Drug Use - No History, Caffeine Use - Daily. Medical History Hematologic/Lymphatic Denies history of Anemia, Hemophilia, Human Immunodeficiency Virus, Lymphedema, Sickle  Cell Disease Respiratory Denies history of Aspiration, Asthma, Chronic Obstructive Pulmonary Disease (COPD), Pneumothorax, Sleep Apnea, Tuberculosis Gastrointestinal Denies history of Cirrhosis , Colitis, Crohn s, Hepatitis A, Hepatitis B, Hepatitis C Endocrine Patient has history of Type II Diabetes Denies history of Type I Diabetes Genitourinary Denies history of End Stage  Renal Disease Immunological Denies history of Lupus Erythematosus, Raynaud s, Scleroderma Integumentary (Skin) Denies history of History of Burn, History of pressure wounds Musculoskeletal Denies history of Gout, Rheumatoid Arthritis, Osteoarthritis, Osteomyelitis Neurologic Denies history of Dementia, Neuropathy, Quadriplegia, Paraplegia, Seizure Disorder Oncologic Denies history of Received Chemotherapy, Received Radiation Psychiatric Denies history of Anorexia/bulimia, Confinement Anxiety Review of Systems (ROS) Constitutional Symptoms (General Health) Denies complaints or symptoms of Fever, Chills. Respiratory The patient has no complaints or symptoms. Cardiovascular The patient has no complaints or symptoms. Psychiatric The patient has no complaints or symptoms. Objective Constitutional Well-nourished and well-hydrated in no acute distress. Vitals Time Taken: 10:14 AM, Height: 68 in, Weight: 210 lbs, BMI: 31.9, Temperature: 97.5 F, Pulse: 74 bpm, Respiratory Rate: 16 breaths/min, Blood Pressure: 125/66 mmHg. Respiratory [place] ([id number]) normal breathing without difficulty. Psychiatric this patient is able to make decisions and demonstrates good insight into disease process. Alert and Oriented x 3. pleasant and cooperative. General Notes: Patient's wound bed currently shows signs of still having bone noted in the base of the wound some which was necrotic it was debrided away today. Fortunately the infection seems to be for the most part improving which is good news nonetheless it  sounds this does still have some significant underlying structural abnormalities noted on MRI. Integumentary (Hair, Skin) Wound #1 status is Open. Original cause of wound was Blister. The wound is located on the [employer]. The wound measures 1.9cm length x 0.9cm width x 0.4cm depth; 1.343cm^2 area and 0.537cm^3 volume. There is Fat Layer (Subcutaneous Tissue) Exposed exposed. There is no tunneling or undermining noted. There is a small amount of serous drainage noted. The wound margin is distinct with the outline attached to the wound base. There is small (1-33%) pink granulation within the wound bed. There is a small (1-33%) amount of necrotic tissue within the wound bed including Eschar and Adherent Slough. The periwound skin appearance exhibited: Callus, Maceration. The periwound skin appearance did not exhibit: Crepitus, Excoriation, Induration, Rash, Scarring, Dry/Scaly, Atrophie Blanche, Cyanosis, Ecchymosis, Hemosiderin Staining, Mottled, Pallor, Rubor, Erythema. Periwound temperature was noted as No Abnormality. The periwound has tenderness on palpation. Assessment Active Problems ICD-10 Type 2 diabetes mellitus with foot ulcer Non-pressure chronic ulcer of other part of right foot with necrosis of bone Other specified peripheral vascular diseases Procedures Wound #1 Pre-procedure diagnosis of Wound #1 is a Diabetic Wound/Ulcer of the Lower Extremity located on the Right,Plantar Toe Great .Severity of Tissue Pre Debridement is: Bone involvement without necrosis. There was a Excisional Skin/Subcutaneous Tissue/Muscle/Bone Debridement with a total area of 1.71 sq cm performed by STONE III, Janny Crute E., PA-C. With the following instrument(s): Curette to remove Viable and Non-Viable tissue/material. Material removed includes Bone,Subcutaneous Tissue, and Slough after achieving pain control using Lidocaine. No specimens were taken. A time out was conducted at 10:44, prior to the  start of the procedure. A Minimum amount of bleeding was controlled with Pressure. The procedure was tolerated well. Post Debridement Measurements: 1.9cm length x 9cm width x 0.5cm depth; 6.715cm^3 volume. Character of Wound/Ulcer Post Debridement is stable. Severity of Tissue Post Debridement is: Necrosis of bone. Post procedure Diagnosis Wound #1: Same as Pre-Procedure Plan [name], [name] ([id number]) Wound Cleansing: Wound #1 Right,Plantar Toe Great: May Shower, gently pat wound dry prior to applying new dressing. - Dial Antibacterial soap Anesthetic (add to Medication List): Wound #1 Right,Plantar Toe Great: Topical Lidocaine 4% cream applied to wound bed prior to debridement (In Clinic Only). Primary Wound  Dressing: Silver Alginate Secondary Dressing: Conform/Kerlix Dressing Change Frequency: Wound #1 Right,Plantar Toe Great: Change dressing every other day. Follow-up Appointments: Wound #1 Right,Plantar Toe Great: Return Appointment in 1 week. Off-Loading: Wound #1 Right,Plantar Toe Great: Open toe surgical shoe with peg assist. Medications-please add to medication list.: Wound #1 Right,Plantar Toe Great: P.O. Antibiotics - Continue antibiotics Consults ordered were: Podiatry - [facility] Referral The following medication(s) was prescribed: Cipro oral 500 mg tablet 1 1 tablet oral taken 2 times a day for 7 days starting 08/10/2018 My suggestion at this point is gonna be that we go ahead and continue with the above wound care measures and the patient is in agreement with that plan. Subsequently we're gonna see were things stand at follow-up. If anything changes or worsens in the meantime he will let me know. Otherwise I am going to go ahead and send in a seven-day prescription for Cipro to be continued since this seems to have helped him up to this point. This will be until he sees infectious disease at [facility] this coming Monday. Otherwise my hope is that nothing will worsen between  now and then. Please see above for specific wound care orders. We will see patient for re-evaluation in 1 week(s) here in the clinic. If anything worsens or changes patient will contact our office for additional recommendations. Electronic Signature(s) Signed: 08/10/2018 12:22:59 PM By: Worthy Keeler PA-C Entered By: Worthy Keeler on 08/10/2018 12:22:28 [name], [name] ([id number]) -------------------------------------------------------------------------------- ROS/PFSH Details Patient Name: KYAN, YURKOVICH. Date of Service: 08/10/2018 10:15 AM Medical Record Number: [id number] Patient Account Number: [id number] Date of Birth/Sex: 01/22/1961 (58 y.o. M) Treating RN: Cornell Barman Primary Care Provider: [name] Other Clinician: Referring Provider: [name] Treating Provider/Extender: Melburn Hake, Uno Esau Weeks in Treatment: 3 Information Obtained From Patient Wound History Do you currently have one or more open woundso No Have you tested positive for osteomyelitis (bone infection)o No Have you had any tests for circulation on your legso No Have you had other problems associated with your woundso Swelling Constitutional Symptoms (General Health) Complaints and Symptoms: Negative for: Fever; Chills Hematologic/Lymphatic Medical History: Negative for: Anemia; Hemophilia; Human Immunodeficiency Virus; Lymphedema; Sickle Cell Disease Respiratory Complaints and Symptoms: No Complaints or Symptoms Medical History: Negative for: Aspiration; Asthma; Chronic Obstructive Pulmonary Disease (COPD); Pneumothorax; Sleep Apnea; Tuberculosis Cardiovascular Complaints and Symptoms: No Complaints or Symptoms Gastrointestinal Medical History: Negative for: Cirrhosis ; Colitis; Crohnos; Hepatitis A; Hepatitis B; Hepatitis C Endocrine Medical History: Positive for: Type II Diabetes Negative for: Type I Diabetes Treated with: Insulin Blood sugar tested every day:  No Genitourinary [place], [place] ([id number]) Medical History: Negative for: End Stage Renal Disease Immunological Medical History: Negative for: Lupus Erythematosus; Raynaudos; Scleroderma Integumentary (Skin) Medical History: Negative for: History of Burn; History of pressure wounds Musculoskeletal Medical History: Negative for: Gout; Rheumatoid Arthritis; Osteoarthritis; Osteomyelitis Neurologic Medical History: Negative for: Dementia; Neuropathy; Quadriplegia; Paraplegia; Seizure Disorder Oncologic Medical History: Negative for: Received Chemotherapy; Received Radiation Psychiatric Complaints and Symptoms: No Complaints or Symptoms Medical History: Negative for: Anorexia/bulimia; Confinement Anxiety Immunizations Pneumococcal Vaccine: Received Pneumococcal Vaccination: No Implantable Devices Family and Social History Cancer: No; Diabetes: Yes - Maternal Grandparents; Heart Disease: Yes - Mother; Hereditary Spherocytosis: No; Hypertension: No; Kidney Disease: No; Lung Disease: No; Seizures: No; Stroke: No; Thyroid Problems: No; Tuberculosis: No; Never smoker; Marital Status - Married; Alcohol Use: Moderate; Drug Use: No History; Caffeine Use: Daily; Financial Concerns: No; Food, Clothing or Shelter Needs: No; Support System Lacking: No;  Transportation Concerns: No; Advanced Directives: No; Patient does not want information on Advanced Directives; Do not resuscitate: No; Living Will: No; Medical Power of Attorney: No Physician Affirmation I have reviewed and agree with the above information. Electronic Signature(s) Signed: 08/10/2018 12:22:59 PM By: Worthy Keeler PA-C Signed: 08/10/2018 4:45:46 PM By: Gretta Cool, BSN, RN, CWS, Kim RN, BSN Entered By: Worthy Keeler on 08/10/2018 12:13:59 REISE, GLADNEY ([id number]) JOHANAN, SKORUPSKI ([id number]) -------------------------------------------------------------------------------- SuperBill Details Patient Name:  DONNAVAN, COVAULT. Date of Service: 08/10/2018 Medical Record Number: [id number] Patient Account Number: [id number] Date of Birth/Sex: [birth year] (58 y.o. 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 I73.89 Other specified peripheral vascular diseases Facility Procedures CPT4 Code: [id number] Description: 95188 - DEB BONE 20 SQ CM/< ICD-10 Diagnosis Description L97.514 Non-pressure chronic ulcer of other part of right foot with ne Modifier: crosis of bone Quantity: 1 Physician Procedures CPT4: Description Modifier Quantity Code [id number] Debridement; bone (includes epidermis, dermis, subQ tissue, muscle and/or fascia, if 1 performed) 1st 20 sqcm or less ICD-10 Diagnosis Description L97.514 Non-pressure chronic ulcer of other part of right  foot with necrosis of bone Electronic Signature(s) Signed: 08/10/2018 12:22:59 PM By: Worthy Keeler PA-C Entered By: Worthy Keeler on 08/10/2018 12:22:36

## 2018-08-18 ENCOUNTER — Encounter: Payer: Medicaid Other | Attending: Physician Assistant | Admitting: Physician Assistant

## 2018-08-18 DIAGNOSIS — Z8249 Family history of ischemic heart disease and other diseases of the circulatory system: Secondary | ICD-10-CM | POA: Insufficient documentation

## 2018-08-18 DIAGNOSIS — E11621 Type 2 diabetes mellitus with foot ulcer: Secondary | ICD-10-CM | POA: Diagnosis not present

## 2018-08-18 DIAGNOSIS — L97514 Non-pressure chronic ulcer of other part of right foot with necrosis of bone: Secondary | ICD-10-CM | POA: Diagnosis not present

## 2018-08-19 NOTE — Progress Notes (Signed)
[name], [name] ([id number]) Visit Report for 08/18/2018 Chief Complaint Document Details Patient Name: [name], [name]. Date of Service: 08/18/2018 9:45 AM Medical Record Number: [id number] Patient Account Number: [id number] Date of Birth/Sex: 1960-12-27 (58 y.o. M) Treating RN: Montey Hora Primary Care Provider: [name] Other Clinician: Referring Provider: [name] Treating Provider/Extender: Melburn Hake, Mysty Kielty Weeks in Treatment: 4 Information Obtained from: Patient Chief Complaint Right great toe ulcer Electronic Signature(s) Signed: 08/18/2018 5:55:30 PM By: Worthy Keeler PA-C Entered By: Worthy Keeler on 08/18/2018 10:21:55 [name] ([id number]) -------------------------------------------------------------------------------- Debridement Details Patient Name: [name]. Date of Service: 08/18/2018 9:45 AM Medical Record Number: [id number] Patient Account Number: [id number] Date of Birth/Sex: 1961/06/29 (58 y.o. M) Treating RN: Montey Hora Primary Care Provider: [name] Other Clinician: Referring Provider: [name] Treating Provider/Extender: Melburn Hake, Thaer Miyoshi Weeks in Treatment: 4 Debridement Performed for Wound #1 Right,Plantar Toe Great Assessment: Performed By: Physician STONE III, Justis Dupas E., PA-C Debridement Type: Debridement Severity of Tissue Pre Necrosis of bone Debridement: Level of Consciousness (Pre- Awake and Alert procedure): Pre-procedure Verification/Time Yes - 10:27 Out Taken: Start Time: 10:27 Pain Control: Lidocaine 4% Topical Solution Total Area Debrided (L x W): 1.9 (cm) x 0.6 (cm) = 1.14 (cm) Tissue and other material Viable, Non-Viable, Bone, Slough, Subcutaneous, Slough debrided: Level: Skin/Subcutaneous Tissue/Muscle/Bone Debridement Description: Excisional Instrument: Curette Bleeding: Moderate Hemostasis Achieved: Pressure End Time: 10:30 Procedural Pain:  0 Post Procedural Pain: 0 Response to Treatment: Procedure was tolerated well Level of Consciousness Awake and Alert (Post-procedure): Post Debridement Measurements of Total Wound Length: (cm) 1.9 Width: (cm) 0.6 Depth: (cm) 0.6 Volume: (cm) 0.537 Character of Wound/Ulcer Post Debridement: Improved Severity of Tissue Post Debridement: Necrosis of bone Post Procedure Diagnosis Same as Pre-procedure Electronic Signature(s) Signed: 08/18/2018 4:52:45 PM By: Montey Hora Signed: 08/18/2018 5:55:30 PM By: Worthy Keeler PA-C Entered By: Montey Hora on 08/18/2018 10:31:02 [name] ([id number]) -------------------------------------------------------------------------------- HPI Details Patient Name: [name]. Date of Service: 08/18/2018 9:45 AM Medical Record Number: [id number] Patient Account Number: [id number] Date of Birth/Sex: 1960/12/23 (58 y.o. M) Treating RN: Montey Hora Primary Care Provider: [name] Other Clinician: Referring Provider: [name] Treating Provider/Extender: Melburn Hake, Blessin Kanno Weeks in Treatment: 4 History of Present Illness HPI Description: 07/20/17 on evaluation today patient actually appears to be doing somewhat poorly in regard to his right plantar great toe ulcer. This is something that he states started as what he thought to be a blister May 04, 2018. Subsequently he treated this on his own for a while thinking that it would just get better. As it did not get better he eventually did go to have this evaluated where it was treated appropriately with it sounds like Bactroban ointment however the patient states that it never completely resolved. Subsequently if this continue to get worse and was looking more irritated and inflamed he did go to the hospital more recently where he was prescribed Keflex. Subsequently he was also referred to the wound care center here for further evaluation by [place]. He had an x-ray  in the ER which revealed no evidence of osteomyelitis that I explained to the patient that osteomyelitis can be present even in a negative x-ray as it's not nearly as sensitive as an MRI would be. No fevers, chills, nausea, or vomiting noted at this time. The patient tells me that this might look a little bit better since he started the antibiotics. He does have a history of diabetes mellitus type II  he also had a horse accident years ago and had to have significant surgery in regard to his right lower extremity that did leave some deformity in this regard. This is mainly centered in the shin region. 07/27/18 upon evaluation today patient appears to be doing fairly well in regard to his toe ulcer all things considered. 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 This obviously is good news. With that being said I do think that he did have some infection which obviously calls some of the necrosis although the wound seems to be doing better he is definitely tolerating the Cipro which I switched into based on the bone culture and seems to be dramatically improving in this regard. Overall I'm pleased with how things stand. 08/03/18 on evaluation today patient appears to be doing rather well in regard to his toe ulcer all things considering. Again at this point the patient's wound shows signs of improvement although this is slow it still seems to be doing very well in my pinion. Fortunately there does not seem to show any signs of infection as far as the overall appearance of the wound bed is concerned at least nothing worsening. Overall the redness and erythema has dramatically improved. I'm very pleased. Patient does have his infectious disease consult on February 3 and his MRI is actually scheduled for the 23rd of this month which is just three days away. 08/10/18  on evaluation today patient actually appears to be doing okay in regard to his toe ulcer. It does not appear to be as inflamed as it was previous. 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 He still has bone exposed in the base of the wound. I am concerned about how well this is actually going to heal or not at this point. Overall I do believe that the Cipro has been of benefit for him and I will continue this one more week until he sees infectious disease next Monday. I'm also gonna see about however getting appointment for him to see podiatry to discuss his options. 08/18/18 on evaluation today patient's wound actually appears to be doing decently well. He does have bone noted at this point in the base of the wound still although there does not appear to be as much necrotic bone as there was previous. He has been tolerating the Cipro without any complications. He did subsequently have an appointment with infectious disease which was supposed to be yesterday. However they called him when he was getting the car to leave to go in order to tell him that they would not be able to see him due to the doctor being out and rescheduled him for the 24th which is three weeks away. To be honest I'm not very pleased with that scenario. Nonetheless the patient tells me as well today that he is not even sure that he would want to go see a podiatrist at this point he really wants to do whatever he can to avoid surgery. Electronic Signature(s) Signed: 08/18/2018 5:55:30 PM By: Marrianne Mood, [place] ([id number]) Entered By: Worthy Keeler on 08/18/2018 10:53:15 [name],  [name]  ([id number]) -------------------------------------------------------------------------------- Physical Exam Details Patient Name: JAHNI, NAZAR. Date of Service: 08/18/2018 9:45 AM Medical Record Number: [id number] Patient Account Number: [id number] Date of Birth/Sex: 10/14/1960 (58 y.o. M) Treating RN: Montey Hora Primary Care Provider: [name] Other Clinician: Referring Provider: [name] Treating Provider/Extender: STONE III, Gianmarco Roye Weeks in Treatment: 4 Constitutional Well-nourished and well-hydrated in no acute distress. Respiratory normal breathing without difficulty. Psychiatric this patient is able to make decisions and demonstrates good insight into disease process. Alert and Oriented x 3. pleasant and cooperative. Notes Patient's wound bed currently shows signs of good granulation there still bone noted in the basin wound but fortunately nothing as significant as previously noted. Overall I'm pleased in this regard. Sharp debridement was performed today which he tolerated without complication I did remove a very slight amount of bone that was still noted in the base of the wound and necrotic/loose but otherwise the wound bed appears to be doing much better which is good news. Electronic Signature(s) Signed: 08/18/2018 5:55:30 PM By: Worthy Keeler PA-C Entered By: Worthy Keeler on 08/18/2018 10:53:58 [name], [name] ([id number]) -------------------------------------------------------------------------------- Physician Orders Details Patient Name: [name], [name]. Date of Service: 08/18/2018 9:45 AM Medical Record Number: [id number] Patient Account Number: [id number] Date of Birth/Sex: October 10, 1960 (58 y.o. M) Treating RN: Montey Hora Primary Care Provider: [name] Other Clinician: Referring Provider: [name] Treating Provider/Extender: Melburn Hake, Tita Terhaar Weeks in Treatment: 4 Verbal / Phone Orders:  No Diagnosis Coding ICD-10 Coding Code Description E11.621 Type 2 diabetes mellitus with foot ulcer L97.514 Non-pressure chronic ulcer of other part of right foot with necrosis of bone I73.89 Other specified peripheral vascular diseases Wound Cleansing Wound #1 Right,Plantar Toe Great o May Shower, gently pat wound dry prior to applying new dressing. - Dial Antibacterial soap Anesthetic (add to Medication List) Wound #1 Right,Plantar Toe Great o Topical Lidocaine 4% cream applied to wound bed prior to debridement (In Clinic Only). Primary Wound Dressing o Silver Alginate Secondary Dressing o Conform/Kerlix Dressing Change Frequency Wound #1 Right,Plantar Toe Great o Change dressing every other day. Follow-up Appointments Wound #1 Right,Plantar Toe Great o Return Appointment in 1 week. Off-Loading Wound #1 Right,Plantar Toe Great o Open toe surgical shoe with peg assist. Medications-please add to medication list. Wound #1 Right,Plantar Toe Great o P.O. Antibiotics - Continue antibiotics Patient Medications Allergies: No Known Allergies Notifications Medication Indication Start End Cipro 08/18/2018 DOSE 1 - oral 500 mg tablet - 1 tablet oral taken 2 times a day for 1 month WON, KREUZER ([id number]) Notifications Medication Indication Start End Electronic Signature(s) Signed: 08/18/2018 10:56:29 AM By: Worthy Keeler PA-C Entered By: Worthy Keeler on 08/18/2018 10:56:29 KEAGON, GLASCOE ([id number]) -------------------------------------------------------------------------------- Problem List Details Patient Name: [name], [name]. Date of Service: 08/18/2018 9:45 AM Medical Record Number: [id number] Patient Account Number: [id number] Date of Birth/Sex: 02-26-1961 (58 y.o. M) Treating RN: Montey Hora Primary Care Provider: [name] Other Clinician: Referring Provider: [name] Treating Provider/Extender: Melburn Hake,  Kataleya Zaugg Weeks in Treatment: 4 Active Problems ICD-10 Evaluated Encounter Code Description Active Date Today Diagnosis E11.621 Type 2 diabetes mellitus with foot ulcer 07/20/2018 No Yes L97.514 Non-pressure chronic ulcer of other part of right foot with 07/20/2018 No Yes necrosis of bone I73.89 Other specified peripheral vascular diseases 07/20/2018 No Yes Inactive Problems Resolved Problems Electronic Signature(s) Signed: 08/18/2018 5:55:30 PM By: Worthy Keeler PA-C Entered By: Worthy Keeler on 08/18/2018 10:21:50 [name] ([id number]) -------------------------------------------------------------------------------- Progress  Note Details Patient Name: [name], [name]. Date of Service: 08/18/2018 9:45 AM Medical Record Number: [id number] Patient Account Number: [id number] Date of Birth/Sex: 1960/09/15 (58 y.o. M) Treating RN: Montey Hora Primary Care Provider: [name] Other Clinician: Referring Provider: [name] Treating Provider/Extender: Melburn Hake, Breezie Micucci Weeks in Treatment: 4 Subjective Chief Complaint Information obtained from Patient Right great toe ulcer History of Present Illness (HPI) 07/20/17 on evaluation today patient actually appears to be doing somewhat poorly in regard to his right plantar great toe ulcer. This is something that he states started as what he thought to be a blister May 04, 2018. Subsequently he treated this on his own for a while thinking that it would just get better. As it did not get better he eventually did go to have this evaluated where it was treated appropriately with it sounds like Bactroban ointment however the patient states that it never completely resolved. Subsequently if this continue to get worse and was looking more irritated and inflamed he did go to the hospital more recently where he was prescribed Keflex. Subsequently he was also referred to the wound care center here for further evaluation by  [place]. He had an x-ray in the ER which revealed no evidence of osteomyelitis that I explained to the patient that osteomyelitis can be present even in a negative x-ray as it's not nearly as sensitive as an MRI would be. No fevers, chills, nausea, or vomiting noted at this time. The patient tells me that this might look a little bit better since he started the antibiotics. He does have a history of diabetes mellitus type II he also had a horse accident years ago and had to have significant surgery in regard to his right lower extremity that did leave some deformity in this regard. This is mainly centered in the shin region. 07/27/18 upon evaluation today patient appears to be doing fairly well in regard to his toe ulcer all things considered. 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 This obviously is good news. With that being said I do think that he did have some infection which obviously calls some of the necrosis although the wound seems to be doing better he is definitely tolerating the Cipro which I switched into based on the bone culture and seems to be dramatically improving in this regard. Overall I'm pleased with how things stand. 08/03/18 on evaluation today patient appears to be doing rather well in regard to his toe ulcer all things considering. Again at this point the patient's wound shows signs of improvement although this is slow it still seems to be doing very well in my pinion. Fortunately there does not seem to show any signs of infection as far as the overall appearance of the wound bed is concerned at least nothing worsening. Overall the redness and erythema has dramatically improved. I'm very pleased. Patient does have his infectious disease consult on February 3 and his MRI is actually scheduled for the 23rd of this month which is just three  days away. 08/10/18 on evaluation today patient actually appears to be doing okay in regard to his toe ulcer. It does not appear to be as inflamed as it was previous. 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 He still has bone exposed in the base of the wound. I am concerned about how well this is actually going to heal or not at this point. Overall I do believe that the Cipro has been of benefit for him and I will continue this one more week until he sees infectious disease next Monday. I'm also gonna see about however getting appointment for him to see podiatry to discuss his options. 08/18/18 on evaluation today patient's wound actually appears to be doing decently well. He does have bone noted at this point in the base of the wound still although there does not appear to be as much necrotic bone as there was previous. He has been tolerating the Cipro without any complications. He did subsequently have an appointment with infectious disease which was supposed to be yesterday. However they called him when he was getting the car to leave to go in order to tell him that they would not be able to see him due to the doctor being out and rescheduled him for the 24th which is three weeks away. To be honest I'm not very pleased with that scenario. Nonetheless the patient tells me as well today that he is not even sure that he would want to go see a podiatrist at this point he really wants to do whatever he can to avoid surgery. [name], [name] ([id number]) Patient History Information obtained from Patient. Family History Diabetes - Maternal Grandparents, Heart Disease - Mother, No family history of Cancer, Hereditary Spherocytosis, Hypertension, Kidney Disease, Lung  Disease, Seizures, Stroke, Thyroid Problems, Tuberculosis. Social History Never smoker, Marital Status - Married, Alcohol Use - Moderate, Drug Use - No History, Caffeine Use - Daily. Medical History Hematologic/Lymphatic Denies history of Anemia, Hemophilia, Human Immunodeficiency Virus, Lymphedema, Sickle Cell Disease Respiratory Denies history of Aspiration, Asthma, Chronic Obstructive Pulmonary Disease (COPD), Pneumothorax, Sleep Apnea, Tuberculosis Gastrointestinal Denies history of Cirrhosis , Colitis, Crohn s, Hepatitis A, Hepatitis B, Hepatitis C Endocrine Patient has history of Type II Diabetes Denies history of Type I Diabetes Genitourinary Denies history of End Stage Renal Disease Immunological Denies history of Lupus Erythematosus, Raynaud s, Scleroderma Integumentary (Skin) Denies history of History of Burn, History of pressure wounds Musculoskeletal Denies history of Gout, Rheumatoid Arthritis, Osteoarthritis, Osteomyelitis Neurologic Denies history of Dementia, Neuropathy, Quadriplegia, Paraplegia, Seizure Disorder Oncologic Denies history of Received Chemotherapy, Received Radiation Psychiatric Denies history of Anorexia/bulimia, Confinement Anxiety Review of Systems (ROS) Constitutional Symptoms (General Health) Denies complaints or symptoms of Fever, Chills. Cardiovascular The patient has no complaints or symptoms. Gastrointestinal The patient has no complaints or symptoms. Psychiatric The patient has no complaints or symptoms. Objective Constitutional [name], [name]. ([id number]) Well-nourished and well-hydrated in no acute distress. Vitals Time Taken: 10:01 AM, Height: 68 in, Weight: 210 lbs, BMI: 31.9, Temperature: 98.1 F, Pulse: 75 bpm, Respiratory Rate: 16 breaths/min, Blood Pressure: 138/78 mmHg. Respiratory normal breathing without difficulty. Psychiatric this patient is able to make decisions and demonstrates good insight into disease  process. Alert and Oriented x 3. pleasant and cooperative. General Notes: Patient's wound bed currently shows signs of good granulation there still bone noted in the basin wound but fortunately nothing as significant as previously noted. Overall I'm pleased in this regard. Sharp debridement was performed today which he tolerated without complication I did remove a very slight amount of bone that was still noted  in the base of the wound and necrotic/loose but otherwise the wound bed appears to be doing much better which is good news. Integumentary (Hair, Skin) Wound #1 status is Open. Original cause of wound was Blister. The wound is located on the [employer]. The wound measures 1.9cm length x 0.6cm width x 0.4cm depth; 0.895cm^2 area and 0.358cm^3 volume. There is Fat Layer (Subcutaneous Tissue) Exposed exposed. There is no tunneling or undermining noted. There is a medium amount of serous drainage noted. The wound margin is distinct with the outline attached to the wound base. There is small (1-33%) pink granulation within the wound bed. There is a small (1-33%) amount of necrotic tissue within the wound bed including Adherent Slough. The periwound skin appearance exhibited: Callus, Maceration. The periwound skin appearance did not exhibit: Crepitus, Excoriation, Induration, Rash, Scarring, Dry/Scaly, Atrophie Blanche, Cyanosis, Ecchymosis, Hemosiderin Staining, Mottled, Pallor, Rubor, Erythema. Periwound temperature was noted as No Abnormality. The periwound has tenderness on palpation. General Notes: dorsal great toe reddened Assessment Active Problems ICD-10 Type 2 diabetes mellitus with foot ulcer Non-pressure chronic ulcer of other part of right foot with necrosis of bone Other specified peripheral vascular diseases Procedures Wound #1 Pre-procedure diagnosis of Wound #1 is a Diabetic Wound/Ulcer of the Lower Extremity located on the Right,Plantar Toe Great .Severity of  Tissue Pre Debridement is: Necrosis of bone. There was a Excisional Skin/Subcutaneous Tissue/Muscle/Bone Debridement with a total area of 1.14 sq cm performed by STONE III, Alease Fait E., PA-C. With the following instrument(s): Curette to remove Viable and Non-Viable tissue/material. Material removed includes Bone,Subcutaneous Tissue, and Slough after achieving pain control using Lidocaine 4% Topical Solution. No specimens were taken. A time out was conducted at 10:27, prior to the start of the procedure. A Moderate amount of bleeding was controlled with Pressure. The procedure was tolerated well with a pain level of 0 throughout and a pain level of 0 following the procedure. Post Debridement [name], [name] ([id number]) Measurements: 1.9cm length x 0.6cm width x 0.6cm depth; 0.537cm^3 volume. Character of Wound/Ulcer Post Debridement is improved. Severity of Tissue Post Debridement is: Necrosis of bone. Post procedure Diagnosis Wound #1: Same as Pre-Procedure Plan Wound Cleansing: Wound #1 Right,Plantar Toe Great: May Shower, gently pat wound dry prior to applying new dressing. - Dial Antibacterial soap Anesthetic (add to Medication List): Wound #1 Right,Plantar Toe Great: Topical Lidocaine 4% cream applied to wound bed prior to debridement (In Clinic Only). Primary Wound Dressing: Silver Alginate Secondary Dressing: Conform/Kerlix Dressing Change Frequency: Wound #1 Right,Plantar Toe Great: Change dressing every other day. Follow-up Appointments: Wound #1 Right,Plantar Toe Great: Return Appointment in 1 week. Off-Loading: Wound #1 Right,Plantar Toe Great: Open toe surgical shoe with peg assist. Medications-please add to medication list.: Wound #1 Right,Plantar Toe Great: P.O. Antibiotics - Continue antibiotics The following medication(s) was prescribed: Cipro oral 500 mg tablet 1 1 tablet oral taken 2 times a day for 1 month starting 08/18/2018 My suggestion is gonna be that we  continue with the above wound care measures for the next week. The patient is in agreement with plan. We will subsequently see were things stand at follow-up. At this time he was to hold off on the podiatry appointment which I'm okay with and we will subsequently see were things stand at follow-up. Otherwise if anything changes in the meantime he will contact the office and let me know. Please see above for specific wound care orders. We will see patient for re-evaluation in 1 week(s) here  in the clinic. If anything worsens or changes patient will contact our office for additional recommendations. Electronic Signature(s) Signed: 08/18/2018 5:55:30 PM By: Worthy Keeler PA-C Entered By: Worthy Keeler on 08/18/2018 10:56:42 [name], [name] ([id number]) -------------------------------------------------------------------------------- ROS/PFSH Details Patient Name: [name], [name]. Date of Service: 08/18/2018 9:45 AM Medical Record Number: [id number] Patient Account Number: [id number] Date of Birth/Sex: Oct 18, 1960 (58 y.o. M) Treating RN: Montey Hora Primary Care Provider: [name] Other Clinician: Referring Provider: [name] Treating Provider/Extender: Melburn Hake, Sevastian Witczak Weeks in Treatment: 4 Information Obtained From Patient Wound History Do you currently have one or more open woundso No Have you tested positive for osteomyelitis (bone infection)o No Have you had any tests for circulation on your legso No Have you had other problems associated with your woundso Swelling Constitutional Symptoms (General Health) Complaints and Symptoms: Negative for: Fever; Chills Hematologic/Lymphatic Medical History: Negative for: Anemia; Hemophilia; Human Immunodeficiency Virus; Lymphedema; Sickle Cell Disease Respiratory Medical History: Negative for: Aspiration; Asthma; Chronic Obstructive Pulmonary Disease (COPD); Pneumothorax; Sleep  Apnea; Tuberculosis Cardiovascular Complaints and Symptoms: No Complaints or Symptoms Gastrointestinal Complaints and Symptoms: No Complaints or Symptoms Medical History: Negative for: Cirrhosis ; Colitis; Crohnos; Hepatitis A; Hepatitis B; Hepatitis C Endocrine Medical History: Positive for: Type II Diabetes Negative for: Type I Diabetes Treated with: Insulin Blood sugar tested every day: No Genitourinary IDREES, QUAM ([id number]) Medical History: Negative for: End Stage Renal Disease Immunological Medical History: Negative for: Lupus Erythematosus; Raynaudos; Scleroderma Integumentary (Skin) Medical History: Negative for: History of Burn; History of pressure wounds Musculoskeletal Medical History: Negative for: Gout; Rheumatoid Arthritis; Osteoarthritis; Osteomyelitis Neurologic Medical History: Negative for: Dementia; Neuropathy; Quadriplegia; Paraplegia; Seizure Disorder Oncologic Medical History: Negative for: Received Chemotherapy; Received Radiation Psychiatric Complaints and Symptoms: No Complaints or Symptoms Medical History: Negative for: Anorexia/bulimia; Confinement Anxiety Immunizations Pneumococcal Vaccine: Received Pneumococcal Vaccination: No Implantable Devices Family and Social History Cancer: No; Diabetes: Yes - Maternal Grandparents; Heart Disease: Yes - Mother; Hereditary Spherocytosis: No; Hypertension: No; Kidney Disease: No; Lung Disease: No; Seizures: No; Stroke: No; Thyroid Problems: No; Tuberculosis: No; Never smoker; Marital Status - Married; Alcohol Use: Moderate; Drug Use: No History; Caffeine Use: Daily; Financial Concerns: No; Food, Clothing or Shelter Needs: No; Support System Lacking: No; Transportation Concerns: No; Advanced Directives: No; Patient does not want information on Advanced Directives; Do not resuscitate: No; Living Will: No; Medical Power of Attorney: No Physician Affirmation I have reviewed and agree with the  above information. Electronic Signature(s) Signed: 08/18/2018 4:52:45 PM By: Montey Hora Signed: 08/18/2018 5:55:30 PM By: Worthy Keeler PA-C Entered By: Worthy Keeler on 08/18/2018 10:53:28 THELTON, GRACA ([id number]) NIKO, PENSON ([id number]) -------------------------------------------------------------------------------- SuperBill Details Patient Name: RAINE, ELSASS. Date of Service: 08/18/2018 Medical Record Number: [id number] Patient Account Number: [id number] Date of Birth/Sex: 10-04-1960 (58 y.o. M) Treating RN: Montey Hora Primary Care Provider: [name] Other Clinician: Referring Provider: [name] Treating Provider/Extender: Melburn Hake, Star Cheese Weeks in Treatment: 4 Diagnosis Coding ICD-10 Codes Code Description E11.621 Type 2 diabetes mellitus with foot ulcer L97.514 Non-pressure chronic ulcer of other part of right foot with necrosis of bone I73.89 Other specified peripheral vascular diseases Facility Procedures CPT4 Code: [id number] Description: 20254 - DEB BONE 20 SQ CM/< ICD-10 Diagnosis Description L97.514 Non-pressure chronic ulcer of other part of right foot with ne Modifier: crosis of bone Quantity: 1 Physician Procedures CPT4: Description Modifier Quantity Code [id number] Debridement; bone (includes epidermis, dermis, subQ tissue, muscle and/or fascia, if 1 performed) 1st 20  sqcm or less ICD-10 Diagnosis Description L97.514 Non-pressure chronic ulcer of other part of right  foot with necrosis of bone Electronic Signature(s) Signed: 08/18/2018 5:55:30 PM By: Worthy Keeler PA-C Entered By: Worthy Keeler on 08/18/2018 10:56:58

## 2018-08-21 NOTE — Progress Notes (Signed)
[name], [name] ([id number]) Visit Report for 08/18/2018 Arrival Information Details Patient Name: [name]. Date of Service: 08/18/2018 9:45 AM Medical Record Number: [id number] Patient Account Number: [id number] Date of Birth/Sex: 1961-01-24 (58 y.o. M) Treating RN: Montey Hora Primary Care Delon Revelo: [name] Other Clinician: Referring Zhanae Proffit: [name] Treating Torrey Horseman/Extender: Melburn Hake, HOYT Weeks in Treatment: 4 Visit Information History Since Last Visit Added or deleted any medications: No Patient Arrived: Ambulatory Any new allergies or adverse reactions: No Arrival Time: 10:00 Had a fall or experienced change in No Accompanied By: wife activities of daily living that may affect Transfer Assistance: None risk of falls: Patient Identification Verified: Yes Signs or symptoms of abuse/neglect since last visito No Secondary Verification Process Completed: Yes Hospitalized since last visit: No Implantable device outside of the clinic excluding No cellular tissue based products placed in the center since last visit: Has Dressing in Place as Prescribed: Yes Pain Present Now: No Electronic Signature(s) Signed: 08/18/2018 4:14:38 PM By: Lorine Bears RCP, RRT, CHT Entered By: Lorine Bears on 08/18/2018 10:01:16 [name] ([id number]) -------------------------------------------------------------------------------- Encounter Discharge Information Details Patient Name: [name]. Date of Service: 08/18/2018 9:45 AM Medical Record Number: [id number] Patient Account Number: [id number] Date of Birth/Sex: Nov 11, 1960 (58 y.o. M) Treating RN: Montey Hora Primary Care Alyissa Whidbee: [name] Other Clinician: Referring Zeus Marquis: [name] Treating Kensleigh Gates/Extender: Melburn Hake, HOYT Weeks in Treatment: 4 Encounter Discharge Information Items Post Procedure Vitals Discharge  Condition: Stable Temperature (F): 98.1 Ambulatory Status: Ambulatory Pulse (bpm): 75 Discharge Destination: Home Respiratory Rate (breaths/min): 16 Transportation: Private Auto Blood Pressure (mmHg): 138/78 Accompanied By: self Schedule Follow-up Appointment: Yes Clinical Summary of Care: Electronic Signature(s) Signed: 08/18/2018 4:52:45 PM By: Montey Hora Entered By: Montey Hora on 08/18/2018 10:32:51 [name] ([id number]) -------------------------------------------------------------------------------- Lower Extremity Assessment Details Patient Name: [name]. Date of Service: 08/18/2018 9:45 AM Medical Record Number: [id number] Patient Account Number: [id number] Date of Birth/Sex: 1961/01/04 (58 y.o. M) Treating RN: Harold Barban Primary Care Burns Timson: [name] Other Clinician: Referring Prayan Ulin: [name] Treating Meric Joye/Extender: Melburn Hake, HOYT Weeks in Treatment: 4 Electronic Signature(s) Signed: 08/20/2018 11:43:02 AM By: Harold Barban Entered By: Harold Barban on 08/18/2018 10:11:32 [name] ([id number]) -------------------------------------------------------------------------------- Multi Wound Chart Details Patient Name: [name]. Date of Service: 08/18/2018 9:45 AM Medical Record Number: [id number] Patient Account Number: [id number] Date of Birth/Sex: 12-28-1960 (58 y.o. M) Treating RN: Montey Hora Primary Care Ege Muckey: [name] Other Clinician: Referring Jaivian Battaglini: [name] Treating Taylah Dubiel/Extender: Melburn Hake, HOYT Weeks in Treatment: 4 Vital Signs Height(in): 68 Pulse(bpm): 75 Weight(lbs): 210 Blood Pressure(mmHg): 138/78 Body Mass Index(BMI): 32 Temperature(F): 98.1 Respiratory Rate 16 (breaths/min): Photos: [1:No Photos] [N/A:N/A] Wound Location: [1:Right Toe Great - Plantar] [N/A:N/A] Wounding Event: [1:Blister] [N/A:N/A] Primary Etiology:  [1:Diabetic Wound/Ulcer of the Lower Extremity] [N/A:N/A] Comorbid History: [1:Type II Diabetes] [N/A:N/A] Date Acquired: [1:05/04/2018] [N/A:N/A] Weeks of Treatment: [1:4] [N/A:N/A] Wound Status: [1:Open] [N/A:N/A] Pending Amputation on [1:Yes] [N/A:N/A] Presentation: Measurements L x W x D [1:1.9x0.6x0.4] [N/A:N/A] (cm) Area (cm) : [1:0.895] [N/A:N/A] Volume (cm) : [1:0.358] [N/A:N/A] % Reduction in Area: [1:57.20%] [N/A:N/A] % Reduction in Volume: [1:57.20%] [N/A:N/A] Classification: [1:Grade 1] [N/A:N/A] Exudate Amount: [1:Medium] [N/A:N/A] Exudate Type: [1:Serous] [N/A:N/A] Exudate Color: [1:amber] [N/A:N/A] Wound Margin: [1:Distinct, outline attached] [N/A:N/A] Granulation Amount: [1:Small (1-33%)] [N/A:N/A] Granulation Quality: [1:Pink] [N/A:N/A] Necrotic Amount: [1:Small (1-33%)] [N/A:N/A] Exposed Structures: [1:Fat Layer (Subcutaneous Tissue) Exposed: Yes Fascia: No Tendon: No Muscle: No Joint: No Bone: No] [N/A:N/A] Epithelialization: [1:None] [N/A:N/A] Periwound  Skin Texture: [1:Callus: Yes Excoriation: No Induration: No Crepitus: No] [N/A:N/A] Rash: No Scarring: No Periwound Skin Moisture: Maceration: Yes N/A N/A Dry/Scaly: No Periwound Skin Color: Atrophie Blanche: No N/A N/A Cyanosis: No Ecchymosis: No Erythema: No Hemosiderin Staining: No Mottled: No Pallor: No Rubor: No Temperature: No Abnormality N/A N/A Tenderness on Palpation: Yes N/A N/A Wound Preparation: Ulcer Cleansing: N/A N/A Rinsed/Irrigated with Saline Topical Anesthetic Applied: Other: lidocaine 4% Assessment Notes: dorsal great toe reddened N/A N/A Treatment Notes Electronic Signature(s) Signed: 08/18/2018 4:52:45 PM By: Montey Hora Entered By: Montey Hora on 08/18/2018 10:26:19 [name] ([id number]) -------------------------------------------------------------------------------- [place] Details Patient Name: [name], [name]. Date of  Service: 08/18/2018 9:45 AM Medical Record Number: [id number] Patient Account Number: [id number] Date of Birth/Sex: [birth year] (58 y.o. M) Treating RN: Montey Hora Primary Care Grayden Burley: [name] Other Clinician: Referring Ovid Witman: [name] Treating Ramy Greth/Extender: Melburn Hake, HOYT Weeks in Treatment: 4 Active Inactive Wound/Skin Impairment Nursing Diagnoses: Impaired tissue integrity Goals: Ulcer/skin breakdown will have a volume reduction of 30% by week 4 Date Initiated: 07/20/2018 Target Resolution Date: 08/20/2018 Goal Status: Active Interventions: Assess patient/caregiver ability to obtain necessary supplies Assess patient/caregiver ability to perform ulcer/skin care regimen upon admission and as needed Notes: Electronic Signature(s) Signed: 08/18/2018 4:52:45 PM By: Montey Hora Entered By: Montey Hora on 08/18/2018 10:25:58 [name] ([id number]) -------------------------------------------------------------------------------- Pain Assessment Details Patient Name: [name]. Date of Service: 08/18/2018 9:45 AM Medical Record Number: [id number] Patient Account Number: [id number] Date of Birth/Sex: May 29, 1961 (58 y.o. M) Treating RN: Montey Hora Primary Care Ziza Hastings: [name] Other Clinician: Referring Ariyona Eid: [name] Treating Lambros Cerro/Extender: Melburn Hake, HOYT Weeks in Treatment: 4 Active Problems Location of Pain Severity and Description of Pain Patient Has Paino No Site Locations Pain Management and Medication Current Pain Management: Electronic Signature(s) Signed: 08/18/2018 4:14:38 PM By: Paulla Fore, RRT, CHT Signed: 08/18/2018 4:52:45 PM By: Montey Hora Entered By: Lorine Bears on 08/18/2018 10:01:24 [name] ([id number]) -------------------------------------------------------------------------------- Patient/Caregiver Education  Details Patient Name: [name], [name]. Date of Service: 08/18/2018 9:45 AM Medical Record Number: [id number] Patient Account Number: [id number] Date of Birth/Gender: 06-19-1961 (58 y.o. M) Treating RN: Montey Hora Primary Care Physician: [name] Other Clinician: Referring Physician: [name] Treating Physician/Extender: Sharalyn Ink in Treatment: 4 Education Assessment Education Provided To: Patient Education Topics Provided Wound/Skin Impairment: Handouts: Other: wound care and offloading as ordered Methods: Demonstration, Explain/Verbal Responses: State content correctly Electronic Signature(s) Signed: 08/18/2018 4:52:45 PM By: Montey Hora Entered By: Montey Hora on 08/18/2018 10:33:08 [name] ([id number]) -------------------------------------------------------------------------------- Wound Assessment Details Patient Name: [name]. Date of Service: 08/18/2018 9:45 AM Medical Record Number: [id number] Patient Account Number: [id number] Date of Birth/Sex: 01-11-1961 (58 y.o. M) Treating RN: Harold Barban Primary Care Raiden Haydu: [name] Other Clinician: Referring Lalana Wachter: [name] Treating Savreen Gebhardt/Extender: Melburn Hake, HOYT Weeks in Treatment: 4 Wound Status Wound Number: 1 Primary Etiology: Diabetic Wound/Ulcer of the Lower Extremity Wound Location: Right Toe Great - Plantar Wound Status: Open Wounding Event: Blister Comorbid Type II Diabetes Date Acquired: 05/04/2018 History: Weeks Of Treatment: 4 Clustered Wound: No Pending Amputation On Presentation Photos Wound Measurements Length: (cm) 1.9 Width: (cm) 0.6 Depth: (cm) 0.4 Area: (cm) 0.895 Volume: (cm) 0.358 % Reduction in Area: 57.2% % Reduction in Volume: 57.2% Epithelialization: None Tunneling: No Undermining: No Wound Description Classification: Grade 3 Foul Odo Wagner Verification: MRI Slough/F Wound  Margin: Distinct, outline attached Exudate Amount: Medium Exudate Type: Serous Exudate  Color: amber r After Cleansing: No ibrino Yes Wound Bed Granulation Amount: Small (1-33%) Exposed Structure Granulation Quality: Pink Fascia Exposed: No Necrotic Amount: Small (1-33%) Fat Layer (Subcutaneous Tissue) Exposed: Yes Necrotic Quality: Adherent Slough Tendon Exposed: No Muscle Exposed: No Joint Exposed: No Bone Exposed: Yes [name], [name]. ([id number]) Periwound Skin Texture Texture Color No Abnormalities Noted: No No Abnormalities Noted: No Callus: Yes Atrophie Blanche: No Crepitus: No Cyanosis: No Excoriation: No Ecchymosis: No Induration: No Erythema: No Rash: No Hemosiderin Staining: No Scarring: No Mottled: No Pallor: No Moisture Rubor: No No Abnormalities Noted: No Dry / Scaly: No Temperature / Pain Maceration: Yes Temperature: No Abnormality Tenderness on Palpation: Yes Wound Preparation Ulcer Cleansing: Rinsed/Irrigated with Saline Topical Anesthetic Applied: Other: lidocaine 4%, Assessment Notes dorsal great toe reddened Treatment Notes Wound #1 (Right, Plantar Toe Great) Notes Silvercel, gauze, foam and conform Electronic Signature(s) Signed: 08/18/2018 1:03:52 PM By: Montey Hora Signed: 08/20/2018 11:43:02 AM By: Harold Barban Entered By: Montey Hora on 08/18/2018 13:03:51 [name] ([id number]) -------------------------------------------------------------------------------- Vitals Details Patient Name: [name]. Date of Service: 08/18/2018 9:45 AM Medical Record Number: [id number] Patient Account Number: [id number] Date of Birth/Sex: Dec 04, 1960 (58 y.o. M) Treating RN: Montey Hora Primary Care Ajmal Kathan: [name] Other Clinician: Referring Sherlin Sonier: [name] Treating Jovonta Levit/Extender: Melburn Hake, HOYT Weeks in Treatment: 4 Vital Signs Time Taken: 10:01 Temperature (F): 98.1 Height (in):  68 Pulse (bpm): 75 Weight (lbs): 210 Respiratory Rate (breaths/min): 16 Body Mass Index (BMI): 31.9 Blood Pressure (mmHg): 138/78 Reference Range: 80 - 120 mg / dl Airway Electronic Signature(s) Signed: 08/18/2018 4:14:38 PM By: Lorine Bears RCP, RRT, CHT Entered By: Lorine Bears on 08/18/2018 10:04:48

## 2018-08-25 ENCOUNTER — Encounter: Payer: Medicaid Other | Admitting: Physician Assistant

## 2018-08-25 DIAGNOSIS — L97514 Non-pressure chronic ulcer of other part of right foot with necrosis of bone: Secondary | ICD-10-CM | POA: Diagnosis not present

## 2018-08-25 DIAGNOSIS — E11621 Type 2 diabetes mellitus with foot ulcer: Secondary | ICD-10-CM | POA: Diagnosis not present

## 2018-08-25 DIAGNOSIS — Z8249 Family history of ischemic heart disease and other diseases of the circulatory system: Secondary | ICD-10-CM | POA: Diagnosis not present

## 2018-08-26 ENCOUNTER — Telehealth: Payer: Self-pay | Admitting: Nurse Practitioner

## 2018-08-26 DIAGNOSIS — E1142 Type 2 diabetes mellitus with diabetic polyneuropathy: Secondary | ICD-10-CM

## 2018-08-26 MED ORDER — ACCU-CHEK FASTCLIX LANCETS MISC: 12 refills | Status: AC

## 2018-08-26 MED ORDER — ACCU-CHEK GUIDE VI STRP: ORAL_STRIP | 12 refills | Status: AC

## 2018-08-26 NOTE — Progress Notes (Signed)
ANIK, WESCH ([id number]) Visit Report for 08/25/2018 Chief Complaint Document Details Patient Name: [name], [name]. Date of Service: 08/25/2018 9:45 AM Medical Record Number: [id number] Patient Account Number: [id number] Date of Birth/Sex: 1960/09/29 (58 y.o. M) Treating RN: Montey Hora Primary Care Provider: [name] Other Clinician: Referring Provider: [name] Treating Provider/Extender: Melburn Hake, HOYT Weeks in Treatment: 5 Information Obtained from: Patient Chief Complaint Right great toe ulcer Electronic Signature(s) Signed: 08/25/2018 11:07:20 AM By: Worthy Keeler PA-C Entered By: Worthy Keeler on 08/25/2018 10:02:07 [name] ([id number]) -------------------------------------------------------------------------------- Debridement Details Patient Name: [name]. Date of Service: 08/25/2018 9:45 AM Medical Record Number: [id number] Patient Account Number: [id number] Date of Birth/Sex: 02/22/1961 (58 y.o. M) Treating RN: Montey Hora Primary Care Provider: [name] Other Clinician: Referring Provider: [name] Treating Provider/Extender: Melburn Hake, HOYT Weeks in Treatment: 5 Debridement Performed for Wound #1 Right,Plantar Toe Great Assessment: Performed By: Physician STONE III, HOYT E., PA-C Debridement Type: Debridement Severity of Tissue Pre Necrosis of bone Debridement: Level of Consciousness (Pre- Awake and Alert procedure): Pre-procedure Verification/Time Yes - 10:33 Out Taken: Start Time: 10:33 Pain Control: Lidocaine 4% Topical Solution Total Area Debrided (L x W): 1.8 (cm) x 0.7 (cm) = 1.26 (cm) Tissue and other material Viable, Non-Viable, Bone, Slough, Subcutaneous, Slough debrided: Level: Skin/Subcutaneous Tissue/Muscle/Bone Debridement Description: Excisional Instrument: Curette Bleeding: Minimum Hemostasis Achieved: Pressure End Time: 10:37 Procedural Pain:  0 Post Procedural Pain: 0 Response to Treatment: Procedure was tolerated well Level of Consciousness Awake and Alert (Post-procedure): Post Debridement Measurements of Total Wound Length: (cm) 1.8 Width: (cm) 0.7 Depth: (cm) 0.6 Volume: (cm) 0.594 Character of Wound/Ulcer Post Debridement: Improved Severity of Tissue Post Debridement: Necrosis of bone Post Procedure Diagnosis Same as Pre-procedure Electronic Signature(s) Signed: 08/25/2018 11:07:20 AM By: Worthy Keeler PA-C Signed: 08/25/2018 4:52:44 PM By: Montey Hora Entered By: Montey Hora on 08/25/2018 10:38:23 [name] ([id number]) -------------------------------------------------------------------------------- HPI Details Patient Name: [name]. Date of Service: 08/25/2018 9:45 AM Medical Record Number: [id number] Patient Account Number: [id number] Date of Birth/Sex: 11-06-1960 (58 y.o. M) Treating RN: Montey Hora Primary Care Provider: [name] Other Clinician: Referring Provider: [name] Treating Provider/Extender: Melburn Hake, HOYT Weeks in Treatment: 5 History of Present Illness HPI Description: 07/20/17 on evaluation today patient actually appears to be doing somewhat poorly in regard to his right plantar great toe ulcer. This is something that he states started as what he thought to be a blister May 04, 2018. Subsequently he treated this on his own for a while thinking that it would just get better. As it did not get better he eventually did go to have this evaluated where it was treated appropriately with it sounds like Bactroban ointment however the patient states that it never completely resolved. Subsequently if this continue to get worse and was looking more irritated and inflamed he did go to the hospital more recently where he was prescribed Keflex. Subsequently he was also referred to the wound care center here for further evaluation by [place]. He had an  x-ray in the ER which revealed no evidence of osteomyelitis that I explained to the patient that osteomyelitis can be present even in a negative x-ray as it's not nearly as sensitive as an MRI would be. No fevers, chills, nausea, or vomiting noted at this time. The patient tells me that this might look a little bit better since he started the antibiotics. He does have a history of diabetes mellitus type II  he also had a horse accident years ago and had to have significant surgery in regard to his right lower extremity that did leave some deformity in this regard. This is mainly centered in the shin region. 07/27/18 upon evaluation today patient appears to be doing fairly well in regard to his toe ulcer all things considered. 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 This obviously is good news. With that being said I do think that he did have some infection which obviously calls some of the necrosis although the wound seems to be doing better he is definitely tolerating the Cipro which I switched into based on the bone culture and seems to be dramatically improving in this regard. Overall I'm pleased with how things stand. 08/03/18 on evaluation today patient appears to be doing rather well in regard to his toe ulcer all things considering. Again at this point the patient's wound shows signs of improvement although this is slow it still seems to be doing very well in my pinion. Fortunately there does not seem to show any signs of infection as far as the overall appearance of the wound bed is concerned at least nothing worsening. Overall the redness and erythema has dramatically improved. I'm very pleased. Patient does have his infectious disease consult on February 3 and his MRI is actually scheduled for the 23rd of this month which is just three days  away. 08/10/18 on evaluation today patient actually appears to be doing okay in regard to his toe ulcer. It does not appear to be as inflamed as it was previous. 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 He still has bone exposed in the base of the wound. I am concerned about how well this is actually going to heal or not at this point. Overall I do believe that the Cipro has been of benefit for him and I will continue this one more week until he sees infectious disease next Monday. I'm also gonna see about however getting appointment for him to see podiatry to discuss his options. 08/18/18 on evaluation today patient's wound actually appears to be doing decently well. He does have bone noted at this point in the base of the wound still although there does not appear to be as much necrotic bone as there was previous. He has been tolerating the Cipro without any complications. He did subsequently have an appointment with infectious disease which was supposed to be yesterday. However they called him when he was getting the car to leave to go in order to tell him that they would not be able to see him due to the doctor being out and rescheduled him for the 24th which is three weeks away. To be honest I'm not very pleased with that scenario. Nonetheless the patient tells me as well today that he is not even sure that he would want to go see a podiatrist at this point he really wants to do whatever he can to avoid surgery. 08/25/18 on evaluation today patient's wound continues to show signs of necrotic bone in the base of the wound as well is necrotic tissue  in general. He does still have your theme of the great toe he's currently on the oral antibiotics while we are  still awaiting the referral to infectious disease which is scheduled for the 24th of this month. At this point I did have a longer conversation today with him concerning hyperbaric option therapy and how considering he doesn't want to have any surgery [name], [name]. ([id number]) wants to try to manage this medically it could potentially help in getting this to resolve. Fortunately there does not appear to be any signs of infection at this time systemically. No fevers, chills, nausea, or vomiting noted at this time. He does have a history of having had some heart disease with what sounds to be potentially stand I would have to get an updated EKG prior to HBO therapy. Nonetheless he is not a smoker and has no lung conditions. I'm not sure if you set in echo but if not we may have to see about this as well prior to initiating therapy to ensure his ejection fraction is sufficient. I will look further into this as well. Electronic Signature(s) Signed: 08/25/2018 11:07:20 AM By: Worthy Keeler PA-C Entered By: Worthy Keeler on 08/25/2018 10:57:30 [name] ([id number]) -------------------------------------------------------------------------------- Physical Exam Details Patient Name: [name], [name]. Date of Service: 08/25/2018 9:45 AM Medical Record Number: [id number] Patient Account Number: [id number] Date of Birth/Sex: Nov 01, 1960 (58 y.o. M) Treating RN: Montey Hora Primary Care Provider: [name] Other Clinician: Referring Provider: [name] Treating Provider/Extender: STONE III, HOYT Weeks in Treatment: 5 Constitutional Well-nourished and well-hydrated in no acute distress. Respiratory normal breathing without difficulty. Psychiatric this patient is able to make decisions and demonstrates good insight into disease process. Alert and Oriented x 3. pleasant and cooperative. Notes Patient's wound bed currently did require sharp debridement I  did remove necrotic bone as well as Slough and subcutaneous tissue from the base of the wound. The wound bed did appear to be better post debridement which is good news. There does not appear to be any signs of worsening infection although the toe itself is still erythematous. Electronic Signature(s) Signed: 08/25/2018 11:07:20 AM By: Worthy Keeler PA-C Entered By: Worthy Keeler on 08/25/2018 10:58:09 [name] ([id number]) -------------------------------------------------------------------------------- Physician Orders Details Patient Name: [name], [name]. Date of Service: 08/25/2018 9:45 AM Medical Record Number: [id number] Patient Account Number: [id number] Date of Birth/Sex: [birth year] (58 y.o. M) Treating RN: Montey Hora Primary Care Provider: [name] Other Clinician: Referring Provider: [name] Treating Provider/Extender: Melburn Hake, HOYT Weeks in Treatment: 5 Verbal / Phone Orders: No Diagnosis Coding ICD-10 Coding Code Description E11.621 Type 2 diabetes mellitus with foot ulcer L97.514 Non-pressure chronic ulcer of other part of right foot with necrosis of bone I73.89 Other specified peripheral vascular diseases Wound Cleansing Wound #1 Right,Plantar Toe Great o May Shower, gently pat wound dry prior to applying new dressing. - Dial Antibacterial soap Anesthetic (add to Medication List) Wound #1 Right,Plantar Toe Great o Topical Lidocaine 4% cream applied to wound bed prior to debridement (In Clinic Only). Primary Wound Dressing o Silver Alginate Secondary Dressing o Conform/Kerlix Dressing Change Frequency Wound #1 Right,Plantar Toe Great o Change dressing every other day. Follow-up Appointments Wound #1 Right,Plantar Toe Great o Return Appointment in 1 week. Off-Loading Wound #1 Right,Plantar Toe Great o Open toe surgical shoe with peg assist. Medications-please add to medication list. Wound #1  Right,Plantar Toe Great o P.O. Antibiotics - Continue antibiotics Electronic Signature(s) Signed: 08/25/2018 11:07:20 AM  By: Worthy Keeler PA-C Signed: 08/25/2018 4:52:44 PM By: Montey Hora Entered By: Montey Hora on 08/25/2018 10:36:00 [name] ([id number], [name] ([id number]) -------------------------------------------------------------------------------- Problem List Details Patient Name: [name], [name]. Date of Service: 08/25/2018 9:45 AM Medical Record Number: [id number] Patient Account Number: [id number] Date of Birth/Sex: Mar 27, 1961 (58 y.o. M) Treating RN: Montey Hora Primary Care Provider: [name] Other Clinician: Referring Provider: [name] Treating Provider/Extender: Melburn Hake, HOYT Weeks in Treatment: 5 Active Problems ICD-10 Evaluated Encounter Code Description Active Date Today Diagnosis E11.621 Type 2 diabetes mellitus with foot ulcer 07/20/2018 No Yes L97.514 Non-pressure chronic ulcer of other part of right foot with 07/20/2018 No Yes necrosis of bone I73.89 Other specified peripheral vascular diseases 07/20/2018 No Yes M86.071 Acute hematogenous osteomyelitis, right ankle and foot 08/25/2018 No Yes Inactive Problems Resolved Problems Electronic Signature(s) Signed: 08/25/2018 11:07:20 AM By: Worthy Keeler PA-C Entered By: Worthy Keeler on 08/25/2018 10:49:23 [name] ([id number]) -------------------------------------------------------------------------------- Progress Note Details Patient Name: [name]. Date of Service: 08/25/2018 9:45 AM Medical Record Number: [id number] Patient Account Number: [id number] Date of Birth/Sex: 01/28/1961 (58 y.o. M) Treating RN: Montey Hora Primary Care Provider: [name] Other Clinician: Referring Provider: [name] Treating Provider/Extender: Melburn Hake, HOYT Weeks in Treatment: 5 Subjective Chief  Complaint Information obtained from Patient Right great toe ulcer History of Present Illness (HPI) 07/20/17 on evaluation today patient actually appears to be doing somewhat poorly in regard to his right plantar great toe ulcer. This is something that he states started as what he thought to be a blister May 04, 2018. Subsequently he treated this on his own for a while thinking that it would just get better. As it did not get better he eventually did go to have this evaluated where it was treated appropriately with it sounds like Bactroban ointment however the patient states that it never completely resolved. Subsequently if this continue to get worse and was looking more irritated and inflamed he did go to the hospital more recently where he was prescribed Keflex. Subsequently he was also referred to the wound care center here for further evaluation by [place]. He had an x-ray in the ER which revealed no evidence of osteomyelitis that I explained to the patient that osteomyelitis can be present even in a negative x-ray as it's not nearly as sensitive as an MRI would be. No fevers, chills, nausea, or vomiting noted at this time. The patient tells me that this might look a little bit better since he started the antibiotics. He does have a history of diabetes mellitus type II he also had a horse accident years ago and had to have significant surgery in regard to his right lower extremity that did leave some deformity in this regard. This is mainly centered in the shin region. 07/27/18 upon evaluation today patient appears to be doing fairly well in regard to his toe ulcer all things considered. 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 This obviously is good news. With that being said I do think that he did have some infection which obviously calls some of the  necrosis although the wound seems to be doing better he is definitely tolerating the Cipro which I switched into based on the bone culture and seems to be dramatically improving in this regard. Overall I'm pleased with how  things stand. 08/03/18 on evaluation today patient appears to be doing rather well in regard to his toe ulcer all things considering. Again at this point the patient's wound shows signs of improvement although this is slow it still seems to be doing very well in my pinion. Fortunately there does not seem to show any signs of infection as far as the overall appearance of the wound bed is concerned at least nothing worsening. Overall the redness and erythema has dramatically improved. I'm very pleased. Patient does have his infectious disease consult on February 3 and his MRI is actually scheduled for the 23rd of this month which is just three days away. 08/10/18 on evaluation today patient actually appears to be doing okay in regard to his toe ulcer. It does not appear to be as inflamed as it was previous. 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 He still has bone exposed in the base of the wound. I am concerned about how well this is actually going to heal or not at this point. Overall I do believe that the Cipro has been of benefit for him and I will continue this one more week until he sees infectious disease next Monday. I'm also gonna see about however getting appointment for him to see podiatry to discuss his options. 08/18/18 on evaluation today patient's wound actually appears to be doing decently well. He does have bone noted at this point in the base of the wound still although there does not appear to be as much necrotic bone as there was  previous. He has been tolerating the Cipro without any complications. He did subsequently have an appointment with infectious disease which was supposed to be yesterday. However they called him when he was getting the car to leave to go in order to tell him that they would not be able to see him due to the doctor being out and rescheduled him for the 24th which is three weeks away. To be honest I'm not very pleased with that scenario. Nonetheless the patient tells me as well today that he is not even sure that he would want to go see a podiatrist at this point he really wants to do whatever he can to avoid surgery. [name], [name] ([id number]) 08/25/18 on evaluation today patient's wound continues to show signs of necrotic bone in the base of the wound as well is necrotic tissue in general. He does still have your theme of the great toe he's currently on the oral antibiotics while we are still awaiting the referral to infectious disease which is scheduled for the 24th of this month. At this point I did have a longer conversation today with him concerning hyperbaric option therapy and how considering he doesn't want to have any surgery wants to try to manage this medically it could potentially help in getting this to resolve. Fortunately there does not appear to be any signs of infection at this time systemically. No fevers, chills, nausea, or vomiting noted at this time. He does have a history of having had some heart disease with what sounds to be potentially stand I would have to get an updated EKG prior to HBO therapy. Nonetheless he is not a smoker and has no lung conditions. I'm not  sure if you set in echo but if not we may have to see about this as well prior to initiating therapy to ensure his ejection fraction is sufficient. I will look further into this as well. Patient History Information obtained from Patient. Family History Diabetes - Maternal Grandparents, Heart Disease -  Mother, No family history of Cancer, Hereditary Spherocytosis, Hypertension, Kidney Disease, Lung Disease, Seizures, Stroke, Thyroid Problems, Tuberculosis. Social History Never smoker, Marital Status - Married, Alcohol Use - Moderate, Drug Use - No History, Caffeine Use - Daily. Medical History Hematologic/Lymphatic Denies history of Anemia, Hemophilia, Human Immunodeficiency Virus, Lymphedema, Sickle Cell Disease Respiratory Denies history of Aspiration, Asthma, Chronic Obstructive Pulmonary Disease (COPD), Pneumothorax, Sleep Apnea, Tuberculosis Gastrointestinal Denies history of Cirrhosis , Colitis, Crohn s, Hepatitis A, Hepatitis B, Hepatitis C Endocrine Patient has history of Type II Diabetes Denies history of Type I Diabetes Genitourinary Denies history of End Stage Renal Disease Immunological Denies history of Lupus Erythematosus, Raynaud s, Scleroderma Integumentary (Skin) Denies history of History of Burn, History of pressure wounds Musculoskeletal Denies history of Gout, Rheumatoid Arthritis, Osteoarthritis, Osteomyelitis Neurologic Denies history of Dementia, Neuropathy, Quadriplegia, Paraplegia, Seizure Disorder Oncologic Denies history of Received Chemotherapy, Received Radiation Psychiatric Denies history of Anorexia/bulimia, Confinement Anxiety Review of Systems (ROS) Constitutional Symptoms (General Health) Denies complaints or symptoms of Fever, Chills. Respiratory The patient has no complaints or symptoms. Cardiovascular The patient has no complaints or symptoms. Psychiatric The patient has no complaints or symptoms. [name], [name]. ([id number]) Objective Constitutional Well-nourished and well-hydrated in no acute distress. Vitals Time Taken: 9:59 AM, Height: 68 in, Weight: 210 lbs, BMI: 31.9, Temperature: 98.5 F, Pulse: 70 bpm, Respiratory Rate: 16 breaths/min, Blood Pressure: 153/76 mmHg. Respiratory normal breathing without  difficulty. Psychiatric this patient is able to make decisions and demonstrates good insight into disease process. Alert and Oriented x 3. pleasant and cooperative. General Notes: Patient's wound bed currently did require sharp debridement I did remove necrotic bone as well as Slough and subcutaneous tissue from the base of the wound. The wound bed did appear to be better post debridement which is good news. There does not appear to be any signs of worsening infection although the toe itself is still erythematous. Integumentary (Hair, Skin) Wound #1 status is Open. Original cause of wound was Blister. The wound is located on the [employer]. The wound measures 1.8cm length x 0.7cm width x 0.4cm depth; 0.99cm^2 area and 0.396cm^3 volume. There is bone and Fat Layer (Subcutaneous Tissue) Exposed exposed. There is no tunneling or undermining noted. There is a medium amount of serous drainage noted. The wound margin is distinct with the outline attached to the wound base. There is small (1-33%) pink granulation within the wound bed. There is a small (1-33%) amount of necrotic tissue within the wound bed including Adherent Slough. The periwound skin appearance exhibited: Callus, Maceration. The periwound skin appearance did not exhibit: Crepitus, Excoriation, Induration, Rash, Scarring, Dry/Scaly, Atrophie Blanche, Cyanosis, Ecchymosis, Hemosiderin Staining, Mottled, Pallor, Rubor, Erythema. Periwound temperature was noted as No Abnormality. The periwound has tenderness on palpation. Assessment Active Problems ICD-10 Type 2 diabetes mellitus with foot ulcer Non-pressure chronic ulcer of other part of right foot with necrosis of bone Other specified peripheral vascular diseases Acute hematogenous osteomyelitis, right ankle and foot [name], [name]. ([id number]) Procedures Wound #1 Pre-procedure diagnosis of Wound #1 is a Diabetic Wound/Ulcer of the Lower Extremity located on the  Right,Plantar Toe Great .Severity of Tissue Pre Debridement is: Necrosis of  bone. There was a Excisional Skin/Subcutaneous Tissue/Muscle/Bone Debridement with a total area of 1.26 sq cm performed by STONE III, HOYT E., PA-C. With the following instrument(s): Curette to remove Viable and Non-Viable tissue/material. Material removed includes Bone,Subcutaneous Tissue, and Slough after achieving pain control using Lidocaine 4% Topical Solution. No specimens were taken. A time out was conducted at 10:33, prior to the start of the procedure. A Minimum amount of bleeding was controlled with Pressure. The procedure was tolerated well with a pain level of 0 throughout and a pain level of 0 following the procedure. Post Debridement Measurements: 1.8cm length x 0.7cm width x 0.6cm depth; 0.594cm^3 volume. Character of Wound/Ulcer Post Debridement is improved. Severity of Tissue Post Debridement is: Necrosis of bone. Post procedure Diagnosis Wound #1: Same as Pre-Procedure Plan Wound Cleansing: Wound #1 Right,Plantar Toe Great: May Shower, gently pat wound dry prior to applying new dressing. - Dial Antibacterial soap Anesthetic (add to Medication List): Wound #1 Right,Plantar Toe Great: Topical Lidocaine 4% cream applied to wound bed prior to debridement (In Clinic Only). Primary Wound Dressing: Silver Alginate Secondary Dressing: Conform/Kerlix Dressing Change Frequency: Wound #1 Right,Plantar Toe Great: Change dressing every other day. Follow-up Appointments: Wound #1 Right,Plantar Toe Great: Return Appointment in 1 week. Off-Loading: Wound #1 Right,Plantar Toe Great: Open toe surgical shoe with peg assist. Medications-please add to medication list.: Wound #1 Right,Plantar Toe Great: P.O. Antibiotics - Continue antibiotics My suggestion at this point is gonna be that we continue with the above wound care measures for the next week we are gonna check on approval for hyperbaric option therapy  which I think would be of benefit as well especially in light of the fact that the patient wants to attempt salvage of the toe as opposed to seeing the surgeon for potential invitation. I explained that hyperbaric's obviously is not 100% but probably would be his best chance in combination with the antibiotic therapy and good wound care at saving his toe. He wants to give this the best shot we can. We will subsequently again check on the approval process and see what we need to find out otherwise for him as far as his past medical history and clearance for getting into the chamber. I'll see about for reevaluation next week. Please see above for specific wound care orders. We will see patient for re-evaluation in 1 week(s) here in the clinic. If anything worsens or changes patient will contact our office for additional recommendations. [name], [name] ([id number]) Electronic Signature(s) Signed: 08/25/2018 11:07:20 AM By: Worthy Keeler PA-C Entered By: Worthy Keeler on 08/25/2018 10:59:01 [name], WAGGLE ([id number]) -------------------------------------------------------------------------------- ROS/PFSH Details Patient Name: [name], [name]. Date of Service: 08/25/2018 9:45 AM Medical Record Number: [id number] Patient Account Number: [id number] Date of Birth/Sex: 02-01-1961 (58 y.o. M) Treating RN: Montey Hora Primary Care Provider: [name] Other Clinician: Referring Provider: [name] Treating Provider/Extender: Melburn Hake, HOYT Weeks in Treatment: 5 Information Obtained From Patient Wound History Do you currently have one or more open woundso No Have you tested positive for osteomyelitis (bone infection)o No Have you had any tests for circulation on your legso No Have you had other problems associated with your woundso Swelling Constitutional Symptoms (General Health) Complaints and Symptoms: Negative for: Fever;  Chills Hematologic/Lymphatic Medical History: Negative for: Anemia; Hemophilia; Human Immunodeficiency Virus; Lymphedema; Sickle Cell Disease Respiratory Complaints and Symptoms: No Complaints or Symptoms Medical History: Negative for: Aspiration; Asthma; Chronic Obstructive Pulmonary Disease (COPD); Pneumothorax; Sleep Apnea; Tuberculosis Cardiovascular Complaints  and Symptoms: No Complaints or Symptoms Gastrointestinal Medical History: Negative for: Cirrhosis ; Colitis; Crohnos; Hepatitis A; Hepatitis B; Hepatitis C Endocrine Medical History: Positive for: Type II Diabetes Negative for: Type I Diabetes Treated with: Insulin Blood sugar tested every day: No Genitourinary [name], [name] ([id number]) Medical History: Negative for: End Stage Renal Disease Immunological Medical History: Negative for: Lupus Erythematosus; Raynaudos; Scleroderma Integumentary (Skin) Medical History: Negative for: History of Burn; History of pressure wounds Musculoskeletal Medical History: Negative for: Gout; Rheumatoid Arthritis; Osteoarthritis; Osteomyelitis Neurologic Medical History: Negative for: Dementia; Neuropathy; Quadriplegia; Paraplegia; Seizure Disorder Oncologic Medical History: Negative for: Received Chemotherapy; Received Radiation Psychiatric Complaints and Symptoms: No Complaints or Symptoms Medical History: Negative for: Anorexia/bulimia; Confinement Anxiety Immunizations Pneumococcal Vaccine: Received Pneumococcal Vaccination: No Implantable Devices Family and Social History Cancer: No; Diabetes: Yes - Maternal Grandparents; Heart Disease: Yes - Mother; Hereditary Spherocytosis: No; Hypertension: No; Kidney Disease: No; Lung Disease: No; Seizures: No; Stroke: No; Thyroid Problems: No; Tuberculosis: No; Never smoker; Marital Status - Married; Alcohol Use: Moderate; Drug Use: No History; Caffeine Use: Daily; Financial Concerns: No; Food, Clothing or Shelter Needs:  No; Support System Lacking: No; Transportation Concerns: No; Advanced Directives: No; Patient does not want information on Advanced Directives; Do not resuscitate: No; Living Will: No; Medical Power of Attorney: No Electronic Signature(s) Signed: 08/25/2018 11:07:20 AM By: Worthy Keeler PA-C Signed: 08/25/2018 4:52:44 PM By: Montey Hora Entered By: Worthy Keeler on 08/25/2018 10:57:54 SHAHZAD, THOMANN ([id number]) -------------------------------------------------------------------------------- SuperBill Details Patient Name: [name]. Date of Service: 08/25/2018 Medical Record Number: [id number] Patient Account Number: [id number] Date of Birth/Sex: 1961/03/25 (58 y.o. M) Treating RN: Montey Hora Primary Care Provider: [name] Other Clinician: Referring Provider: [name] Treating Provider/Extender: Melburn Hake, HOYT Weeks in Treatment: 5 Diagnosis Coding ICD-10 Codes Code Description E11.621 Type 2 diabetes mellitus with foot ulcer L97.514 Non-pressure chronic ulcer of other part of right foot with necrosis of bone I73.89 Other specified peripheral vascular diseases M86.071 Acute hematogenous osteomyelitis, right ankle and foot Facility Procedures CPT4 Code: [id number] Description: 06301 - DEB BONE 20 SQ CM/< ICD-10 Diagnosis Description L97.514 Non-pressure chronic ulcer of other part of right foot with ne Modifier: crosis of bone Quantity: 1 Physician Procedures CPT4: Description Modifier Quantity Code [id number] Debridement; bone (includes epidermis, dermis, subQ tissue, muscle and/or fascia, if 1 performed) 1st 20 sqcm or less ICD-10 Diagnosis Description L97.514 Non-pressure chronic ulcer of other part of right  foot with necrosis of bone Electronic Signature(s) Signed: 08/25/2018 11:07:20 AM By: Worthy Keeler PA-C Entered By: Worthy Keeler on 08/25/2018 10:59:09

## 2018-08-26 NOTE — Telephone Encounter (Signed)
Rx send

## 2018-08-26 NOTE — Telephone Encounter (Addendum)
Pt needs lancets and needles sent to [facility]

## 2018-08-27 ENCOUNTER — Other Ambulatory Visit: Payer: Self-pay

## 2018-08-27 ENCOUNTER — Telehealth: Payer: Self-pay | Admitting: Family Medicine

## 2018-08-27 DIAGNOSIS — E1142 Type 2 diabetes mellitus with diabetic polyneuropathy: Secondary | ICD-10-CM

## 2018-08-27 NOTE — Telephone Encounter (Signed)
Patient walked in today to request order of Insulin Pen Needles, he already has active order with refills, but pharmacy could not fill for him due to it being early, he can get them filled in 8 days, but he seems to have run out early.  He uses lantus insulin injection 40 units daily.  I called pharmacy he can purchase his normal pen needles for $51 for box of 100, smallest size they come in, with discount card. Or wait 8 days to have ins cover it.  I have one alternative he can come by office to pick up a Basaglar insulin pen - same medicine, different brand, it has 7 pen needles in it, good for about 1 week of daily dose injection for him.  He may be 1 day short, but then he can pick up his pen needles as he needs  I called patient and LVM - but did not reach him - if you can try to call him again later today if he does show back up for the sample that is fine.  [name], DO [facility] 08/27/2018, 10:19 AM

## 2018-08-27 NOTE — Telephone Encounter (Signed)
Patient notified

## 2018-08-31 ENCOUNTER — Encounter: Payer: Self-pay | Admitting: Family Medicine

## 2018-08-31 ENCOUNTER — Other Ambulatory Visit: Payer: Self-pay

## 2018-08-31 ENCOUNTER — Ambulatory Visit: Payer: Medicaid Other | Admitting: Family Medicine

## 2018-08-31 VITALS — BP 141/77 | HR 75 | Temp 97.5°F | Resp 16 | Ht 71.0 in | Wt 199.8 lb

## 2018-08-31 DIAGNOSIS — I5022 Chronic systolic (congestive) heart failure: Secondary | ICD-10-CM | POA: Diagnosis not present

## 2018-08-31 DIAGNOSIS — E1142 Type 2 diabetes mellitus with diabetic polyneuropathy: Secondary | ICD-10-CM

## 2018-08-31 DIAGNOSIS — L97514 Non-pressure chronic ulcer of other part of right foot with necrosis of bone: Secondary | ICD-10-CM | POA: Diagnosis not present

## 2018-08-31 DIAGNOSIS — N183 Chronic kidney disease, stage 3 unspecified: Secondary | ICD-10-CM

## 2018-08-31 DIAGNOSIS — E11621 Type 2 diabetes mellitus with foot ulcer: Secondary | ICD-10-CM | POA: Diagnosis not present

## 2018-08-31 DIAGNOSIS — I10 Essential (primary) hypertension: Secondary | ICD-10-CM

## 2018-08-31 LAB — POCT GLYCOSYLATED HEMOGLOBIN (HGB A1C): Hemoglobin A1C: 10.6 % — AB (ref 4.0–5.6)

## 2018-08-31 MED ORDER — CARVEDILOL 25 MG PO TABS: 25.0000 mg | ORAL_TABLET | Freq: Two times a day (BID) | ORAL | 3 refills | Status: DC

## 2018-08-31 MED ORDER — LOSARTAN POTASSIUM 100 MG PO TABS: 100.0000 mg | ORAL_TABLET | Freq: Every day | ORAL | 3 refills | Status: DC

## 2018-08-31 MED ORDER — INSULIN PEN NEEDLE 31G X 8 MM MISC: 3 refills | Status: DC

## 2018-08-31 NOTE — Assessment & Plan Note (Signed)
Persistent CKD-III Followed by Nephrology [facility] On ARB Avoid metformin Caution excessive fluid / sodium intake Follow-up with Renal

## 2018-08-31 NOTE — Addendum Note (Signed)
Addended by: Olin Hauser on: 08/31/2018 08:34 AM   Modules accepted: Orders

## 2018-08-31 NOTE — Telephone Encounter (Addendum)
Patient seen 2/17. Given sample Basaglar pen with 7 pen needles included.  He will check pharmacy this week for his pen needle order.  I have re ordered insulin pen needles for 2 injections per day (lantus and victoza) = 60 injections per month, and 180 injection per 3 months. He will need at least 180 pen needles per 3 months, boxes come in 100, I have re ordered 200 pen needles and sent to pharmacy for 3 month supply.  Can you call to confirm this order w/ correct information and amount and soonest he can pick them up?  [name], [facility] 08/31/2018, 8:34 AM

## 2018-08-31 NOTE — Telephone Encounter (Signed)
Pharmacy notified about recent Rx.

## 2018-08-31 NOTE — Assessment & Plan Note (Addendum)
Improved controlled HTN now Complication with CKD-III, CAD    Plan:  1. Continue current BP regimen - Losartan 100mg , Carvedilol 25mg  BID, Hydralazine 100mg  q 8 hr, isosorbide mononitrate imdur 90mg  - REFILLED Losartan carvedilol had run out 2. Encourage improved lifestyle - low sodium diet, regular exercise 3. Continue monitor BP outside office, bring readings to next visit, if persistently >140/90 or new symptoms notify office sooner 4. Follow-up 3 months

## 2018-08-31 NOTE — Progress Notes (Signed)
Subjective:    Patient ID: [name], male    DOB: Feb 26, 1961, 58 y.o.   MRN: [id number]  [name] is a 58 y.o. male presenting on 08/31/2018 for Diabetes   HPI   Specialists Cardiology - Dr Fletcher [facility]) Nephrology - Dr Holley Raring ([facility])  CHRONIC DM, Type 2 with DM Neuropathy/ CKD-III Recent history worsening A1c due to non adherence to insulin and victoza, he had issue ran out of insulin pen needles could not get them sooner, needed rx updates. CBG: Avg150-180, Low>100, High< 300. Checks fasting AM most days Meds: - Victoza 1.2 mg inj daily - Lantus 40udaily - OFFMetformin due to CKD. Failed Bydureon in past due to nausea Reports good compliance. Tolerating well w/o side-effects Currently on ARB Lifestyle: - Diet (Still slowly improving DM diet) - Exercise (Limited regular exericse) UTD DM Eye Exam 07/2018 -Admits intermittent burning, tingling at times in feet, worse at night - Admits R Toe Ulceration, see below Denies hypoglycemia, polyuria, visual changes.  Right Toe, Diabetic Ulceration Followed by [place], next apt tomorrow, has had some improvement, but still last report says some necrotic tissue at base, no sign of systemic infection, he has transitioned to oral cipro 500mg  twice daily, and he is scheduled to see [facility] Infectious Disease on Feb 2/24.  CHRONIC HTN: Reports no new concerns. Recently improved he did run out of BP med recently. Current Meds - Losartan 100mg  daily (OUT need refill) , Hydralazine 100mg  TID, Carvedilol 25mg  BID (OUT need refill), Imdur 90mg  daily   Reports good compliance, took meds today. Tolerating well, w/o complaints. Denies CP, dyspnea, HA, edema, dizziness / lightheadedness   Depression screen [facility] 2/9 08/31/2018 02/23/2018 08/22/2017  Decreased Interest 0 0 0  Down, Depressed, Hopeless 0 0 0  PHQ - 2 Score 0 0 0    Social History   Tobacco Use  . Smoking status: Passive Smoke  Exposure - Never Smoker  . Smokeless tobacco: Never Used  Substance Use Topics  . Alcohol use: Yes  . Drug use: No    Review of Systems Per HPI unless specifically indicated above     Objective:    BP (!) 141/77   Pulse 75   Temp (!) 97.5 F (36.4 C) (Oral)   Resp 16   Ht 5\' 11"  (1.803 m)   Wt 199 lb 12.8 oz (90.6 kg)   BMI 27.87 kg/m   Wt Readings from Last 3 Encounters:  08/31/18 199 lb 12.8 oz (90.6 kg)  07/16/18 210 lb (95.3 kg)  05/28/18 211 lb 3.2 oz (95.8 kg)    Physical Exam Vitals signs and nursing note reviewed.  Constitutional:      General: He is not in acute distress.    Appearance: He is well-developed. He is not diaphoretic.     Comments: Well-appearing, comfortable, cooperative  HENT:     Head: Normocephalic and atraumatic.  Eyes:     General:        Right eye: No discharge.        Left eye: No discharge.     Conjunctiva/sclera: Conjunctivae normal.  Neck:     Musculoskeletal: Normal range of motion and neck supple.     Thyroid: No thyromegaly.  Cardiovascular:     Rate and Rhythm: Normal rate and regular rhythm.     Heart sounds: Normal heart sounds. No murmur.  Pulmonary:     Effort: Pulmonary effort is normal. No respiratory distress.  Breath sounds: Normal breath sounds. No wheezing or rales.  Musculoskeletal: Normal range of motion.        General: No swelling or tenderness.     Right lower leg: No edema.     Left lower leg: No edema.  Lymphadenopathy:     Cervical: No cervical adenopathy.  Skin:    General: Skin is warm and dry.     Findings: No erythema or rash.     Comments: Right great toe wrapped, and in post op shoe for support, declines to unwrap today he will go to wound doctor tomorrow.  Neurological:     Mental Status: He is alert and oriented to person, place, and time.  Psychiatric:        Behavior: Behavior normal.     Comments: Well groomed, good eye contact, normal speech and thoughts    Results for orders placed  or performed in visit on 08/31/18  POCT HgB A1C  Result Value Ref Range   Hemoglobin A1C 10.6 (A) 4.0 - 5.6 %   Recent Labs    02/23/18 0837 05/28/18 0831 08/31/18 0820  HGBA1C 9.4* 11.9* 10.6*       Assessment & Plan:   Problem List Items Addressed This Visit    Chronic kidney disease (CKD), stage III (moderate) ([place])    Persistent CKD-III Followed by Nephrology [facility] On ARB Avoid metformin Caution excessive fluid / sodium intake Follow-up with Renal      Chronic systolic heart failure (HCC)    Clinically stable, euvolemic today CHF with history of reduced EF improved to 40-45% in 2019 Last seen Cardiology [facility] [id number] Complication with CKD-III-IV  Plan Continue current med management per Cardiology Reviewed diet avoid high sodium foods      Relevant Medications   losartan (COZAAR) 100 MG tablet   carvedilol (COREG) 25 MG tablet   Diabetic ulcer of toe of right foot associated with type 2 diabetes mellitus, with necrosis of bone (HCC)    Followed by [facility] Wound Slow improvement, concern deeper tissue/bone necrosis evident without systemic infection Has had prior imaging Recent wound debridement On antibiotics Cipro Follow up as scheduled w/ recent referral to [facility] ID 2/24      Relevant Medications   losartan (COZAAR) 100 MG tablet   DM type 2 with diabetic peripheral neuropathy (HCC) - Primary    Improving A1c now back on medications from 11.9 down to 10.6 No hypoglycemia Complications - Hyperglycemia, CKD-III, peripheral neuropathy, DM retinopathy, DM ulceration Meds - contraindicated Metformin with CKD / Failed Bydureon BCise x 2 trials (nausea)  Plan:  1.  Continue Lantus 40u daily, Victoza 1.2 u daily - NEW order pen needles for 60 per month 180 per 3 month - sent 200 to pharmacy - Given sample basaglar for 7 pen needles and pen to use until can get needles from pharmacy 2. Encourage improved lifestyle - low carb, low sugar diet, reduce portion size,  continue improving regular exercise 3. Check CBG, bring log to next visit for review 4. Continue ASA, ARB, Statin - restart statin again today - sent refill 5. Follow-up 3 months DM A1c  If not improved A1c, strongly consider referral to Endocrinology for additional assistance.      Relevant Medications   losartan (COZAAR) 100 MG tablet   Other Relevant Orders   POCT HgB A1C (Completed)   Resistant hypertension    Improved controlled HTN now Complication with CKD-III, CAD    Plan:  1. Continue current BP  regimen - Losartan 100mg , Carvedilol 25mg  BID, Hydralazine 100mg  q 8 hr, isosorbide mononitrate imdur 90mg  - REFILLED Losartan carvedilol had run out 2. Encourage improved lifestyle - low sodium diet, regular exercise 3. Continue monitor BP outside office, bring readings to next visit, if persistently >140/90 or new symptoms notify office sooner 4. Follow-up 3 months      Relevant Medications   losartan (COZAAR) 100 MG tablet   carvedilol (COREG) 25 MG tablet      Meds ordered this encounter  Medications  . losartan (COZAAR) 100 MG tablet    Sig: Take 1 tablet (100 mg total) by mouth daily.    Dispense:  90 tablet    Refill:  3  . carvedilol (COREG) 25 MG tablet    Sig: Take 1 tablet (25 mg total) by mouth 2 (two) times daily with a meal.    Dispense:  180 tablet    Refill:  3    Follow up plan: Return in about 3 months (around 11/29/2018) for DM A1c, HTN, Toe Ulcer.   [name], DO [facility] 08/31/2018, 8:17 AM

## 2018-08-31 NOTE — Progress Notes (Signed)
[name], [name] ([id number]) Visit Report for 08/25/2018 Arrival Information Details Patient Name: [name], [name]. Date of Service: 08/25/2018 9:45 AM Medical Record Number: [id number] Patient Account Number: [id number] Date of Birth/Sex: July 03, 1961 (58 y.o. M) Treating RN: [name] Harold Care [name]: [name] Other Clinician: Referring [name]: [name] Treating [name]/Extender: [name], [name] in Treatment: 5 Visit Information History Since Last Visit Added or deleted any medications: No Patient Arrived: Ambulatory Any new allergies or adverse reactions: No Arrival Time: 09:58 Had a fall or experienced change in No Accompanied By: self activities of daily living that may affect Transfer Assistance: None risk of falls: Patient Identification Verified: Yes Signs or symptoms of abuse/neglect since last visito No Secondary Verification Process Completed: Yes Hospitalized since last visit: No Implantable device outside of the clinic excluding No cellular tissue based products placed in the center since last visit: Has Dressing in Place as Prescribed: Yes Pain Present Now: No Electronic Signature(s) Signed: 08/31/2018 9:36:27 AM By: [name] Entered By: [name] on 08/25/2018 14:22:12 [name] ([id number]) -------------------------------------------------------------------------------- Encounter Discharge Information Details Patient Name: [name]. Date of Service: 08/25/2018 9:45 AM Medical Record Number: [id number] Patient Account Number: [id number] Date of Birth/Sex: May 02, 1961 (58 y.o. M) Treating RN: [name] [name]: [name] Other Clinician: Referring [name]: [name] Treating [name]/Extender: [name], [name] in Treatment: 5 Encounter Discharge Information Items Post Procedure Vitals Discharge Condition: Stable Temperature (F):  98.5 Ambulatory Status: Ambulatory Pulse (bpm): 70 Discharge Destination: Home Respiratory Rate (breaths/min): 16 Transportation: Private Auto Blood Pressure (mmHg): 153/76 Accompanied By: self Schedule Follow-up Appointment: Yes Clinical Summary of Care: Electronic Signature(s) Signed: 08/25/2018 4:52:44 PM By: [name] Entered By: [name] on 08/25/2018 10:38:58 [name] ([id number]) -------------------------------------------------------------------------------- Lower Extremity Assessment Details Patient Name: [name]. Date of Service: 08/25/2018 9:45 AM Medical Record Number: [id number] Patient Account Number: [id number] Date of Birth/Sex: 06-11-1961 (58 y.o. M) Treating RN: [name] Harold Care Beryl Balz: [name] Other Clinician: Referring [name]: [name] Treating Niya Behler/Extender: [name], [name] in Treatment: 5 Edema Assessment Assessed: [Left: No] [Right: No] Edema: [Left: N] [Right: o] Vascular Assessment Pulses: Posterior Tibial Extremity colors, hair growth, and conditions: Extremity Color: [Right:Normal] Hair Growth on Extremity: [Right:Yes] Temperature of Extremity: [Right:Warm] Capillary Refill: [Right:< 3 seconds] Toe Nail Assessment Left: Right: Thick: No Discolored: No Deformed: No Improper Length and Hygiene: No Electronic Signature(s) Signed: 08/25/2018 4:35:39 PM By: [name] Entered By: [name] on 08/25/2018 10:06:35 [name] ([id number]) -------------------------------------------------------------------------------- Multi Wound Chart Details Patient Name: [name]. Date of Service: 08/25/2018 9:45 AM Medical Record Number: [id number] Patient Account Number: [id number] Date of Birth/Sex: [birth year] (58 y.o. M) Treating RN: [name] Harold Care Eaven Schwager: [name] Other Clinician: Referring Ace Bergfeld: [name] Treating Jayziah Bankhead/Extender: [name], [name] in Treatment: 5 Vital Signs Height(in): 68 Pulse(bpm): 70 Weight(lbs): 210 Blood Pressure(mmHg): 153/76 Body Mass Index(BMI): 32 Temperature(F): 98.5 Respiratory Rate 16 (breaths/min): Photos: [N/A:N/A] Wound Location: Right Toe Great - Plantar N/A N/A Wounding Event: Blister N/A N/A Harold Etiology: Diabetic Wound/Ulcer of the N/A N/A Lower Extremity Comorbid History: Type II Diabetes N/A N/A Date Acquired: 05/04/2018 N/A N/A [name] of Treatment: 5 N/A N/A Wound Status: Open N/A N/A Pending Amputation on Yes N/A N/A Presentation: Measurements L x W x D 1.8x0.7x0.4 N/A N/A (cm) Area (cm) : 0.99 N/A N/A Volume (cm) : 0.396 N/A N/A % Reduction in Area: 52.60% N/A N/A % Reduction in Volume:  52.60% N/A N/A Classification: Grade 3 N/A N/A Exudate Amount: Medium N/A N/A Exudate Type: Serous N/A N/A Exudate Color: amber N/A N/A Wound Margin: Distinct, outline attached N/A N/A Granulation Amount: Small (1-33%) N/A N/A Granulation Quality: Pink N/A N/A Necrotic Amount: Small (1-33%) N/A N/A Exposed Structures: Fat Layer (Subcutaneous N/A N/A Tissue) Exposed: Yes Bone: Yes Fascia: No Tendon: No [name], [name]. ([id number]) Muscle: No Joint: No Epithelialization: Small (1-33%) N/A N/A Periwound Skin Texture: Callus: Yes N/A N/A Excoriation: No Induration: No Crepitus: No Rash: No Scarring: No Periwound Skin Moisture: Maceration: Yes N/A N/A Dry/Scaly: No Periwound Skin Color: Atrophie Blanche: No N/A N/A Cyanosis: No Ecchymosis: No Erythema: No Hemosiderin Staining: No Mottled: No Pallor: No Rubor: No Temperature: No Abnormality N/A N/A Tenderness on Palpation: Yes N/A N/A Wound Preparation: Ulcer Cleansing: N/A N/A Rinsed/Irrigated with Saline Topical Anesthetic Applied: Other: lidocaine 4% Treatment Notes Electronic Signature(s) Signed: 08/25/2018 4:52:44 PM By: [name] Entered By: [name] on 08/25/2018 10:33:05 [name] ([id number]) -------------------------------------------------------------------------------- [name] Details Patient Name: [name], [name]. Date of Service: 08/25/2018 9:45 AM Medical Record Number: [id number] Patient Account Number: [id number] Date of Birth/Sex: [birth year] (58 y.o. M) Treating RN: [name] Harold Care Johnathon Mittal: [name] Other Clinician: Referring Annalina Needles: [name] Treating Shaneece Stockburger/Extender: [name], [name] in Treatment: 5 Active Inactive Wound/Skin Impairment Nursing Diagnoses: Impaired tissue integrity Goals: Ulcer/skin breakdown will have a volume reduction of 30% by week 4 Date Initiated: 07/20/2018 Target Resolution Date: 08/20/2018 Goal Status: Active Interventions: Assess patient/caregiver ability to obtain necessary supplies Assess patient/caregiver ability to perform ulcer/skin care regimen upon admission and as needed Notes: Electronic Signature(s) Signed: 08/25/2018 4:52:44 PM By: [name] Entered By: [name] on 08/25/2018 10:32:57 [name] ([id number]) -------------------------------------------------------------------------------- Pain Assessment Details Patient Name: [name]. Date of Service: 08/25/2018 9:45 AM Medical Record Number: [id number] Patient Account Number: [id number] Date of Birth/Sex: 30-Jan-1961 (58 y.o. M) Treating RN: [name] Harold Care Anelise Staron: [name] Other Clinician: Referring Ifeanyichukwu Wickham: [name] Treating Ariele Vidrio/Extender: [name], [name] in Treatment: 5 Active Problems Location of Pain Severity and Description of Pain Patient Has Paino No Site Locations Pain Management and Medication Current Pain Management: Electronic Signature(s) Signed: 08/25/2018 4:52:44 PM By: [name] Signed: 08/31/2018 9:36:27 AM By:  [name] Entered By: [name] on 08/25/2018 14:22:17 [name] ([id number]) -------------------------------------------------------------------------------- Patient/Caregiver Education Details Patient Name: [name], NEUSER. Date of Service: 08/25/2018 9:45 AM Medical Record Number: [id number] Patient Account Number: [id number] Date of Birth/Gender: 10/07/1960 (58 y.o. M) Treating RN: [name] Harold Care Physician: [name] Other Clinician: Referring Physician: Nobie [name] Treating Physician/Extender: Sharalyn Ink in Treatment: 5 Education Assessment Education Provided To: Patient Education Topics Provided Wound/Skin Impairment: Handouts: Other: wound care as ordered Methods: Demonstration, Explain/Verbal Responses: State content correctly Electronic Signature(s) Signed: 08/25/2018 4:52:44 PM By: [name] Entered By: [name] on 08/25/2018 10:36:35 [name] ([id number]) -------------------------------------------------------------------------------- Wound Assessment Details Patient Name: [name]. Date of Service: 08/25/2018 9:45 AM Medical Record Number: [id number] Patient Account Number: [id number] Date of Birth/Sex: [birth year] (58 y.o. M) Treating RN: [name] Harold Care Celest Reitz: [name] Other Clinician: Referring Keshonda Monsour: [name] Treating Cobi Delph/Extender: STONE III, [name] in Treatment: 5 Wound Status Wound Number: 1 Harold Etiology: Diabetic Wound/Ulcer of the Lower Extremity Wound Location: Right Toe Great - Plantar Wound Status: Open Wounding Event: Blister Comorbid Type II Diabetes Date Acquired: 05/04/2018 History: [name] Of Treatment: 5 Clustered Wound:  No Pending Amputation On Presentation Photos Photo Uploaded By: [name] on 08/25/2018 10:30:44 Wound Measurements Length: (cm) 1.8 Width: (cm) 0.7 Depth: (cm)  0.4 Area: (cm) 0.99 Volume: (cm) 0.396 % Reduction in Area: 52.6% % Reduction in Volume: 52.6% Epithelialization: Small (1-33%) Tunneling: No Undermining: No Wound Description Classification: Grade 3 Foul Odo Wound Margin: Distinct, outline attached Slough/F Exudate Amount: Medium Exudate Type: Serous Exudate Color: amber r After Cleansing: No ibrino Yes Wound Bed Granulation Amount: Small (1-33%) Exposed Structure Granulation Quality: Pink Fascia Exposed: No Necrotic Amount: Small (1-33%) Fat Layer (Subcutaneous Tissue) Exposed: Yes Necrotic Quality: Adherent Slough Tendon Exposed: No Muscle Exposed: No Joint Exposed: No Bone Exposed: Yes Periwound Skin Texture [name], [name]. ([id number]) Texture Color No Abnormalities Noted: No No Abnormalities Noted: No Callus: Yes Atrophie Blanche: No Crepitus: No Cyanosis: No Excoriation: No Ecchymosis: No Induration: No Erythema: No Rash: No Hemosiderin Staining: No Scarring: No Mottled: No Pallor: No Moisture Rubor: No No Abnormalities Noted: No Dry / Scaly: No Temperature / Pain Maceration: Yes Temperature: No Abnormality Tenderness on Palpation: Yes Wound Preparation Ulcer Cleansing: Rinsed/Irrigated with Saline Topical Anesthetic Applied: Other: lidocaine 4%, Treatment Notes Wound #1 (Right, Plantar Toe Great) Notes Silvercel, gauze, foam and conform Electronic Signature(s) Signed: 08/25/2018 4:35:39 PM By: [name] Entered By: [name] on 08/25/2018 10:06:13 [name] ([id number]) -------------------------------------------------------------------------------- Vitals Details Patient Name: [name]. Date of Service: 08/25/2018 9:45 AM Medical Record Number: [id number] Patient Account Number: [id number] Date of Birth/Sex: 1961/01/15 (58 y.o. M) Treating RN: [name] Harold Care Jerame Hedding: [name] Other Clinician: Referring Jazzman Loughmiller: [name] Treating Alizza Sacra/Extender: [name], [name] in Treatment: 5 Vital Signs Time Taken: 09:59 Temperature (F): 98.5 Height (in): 68 Pulse (bpm): 70 Weight (lbs): 210 Respiratory Rate (breaths/min): 16 Body Mass Index (BMI): 31.9 Blood Pressure (mmHg): 153/76 Reference Range: 80 - 120 mg / dl Airway Electronic Signature(s) Signed: 08/25/2018 3:52:51 PM By: Lorine Bears RCP, RRT, CHT Entered By: Becky Sax, Amado Nash on 08/25/2018 10:01:22

## 2018-08-31 NOTE — Assessment & Plan Note (Signed)
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

## 2018-08-31 NOTE — Assessment & Plan Note (Addendum)
Clinically stable, euvolemic today CHF with history of reduced EF improved to 40-45% in 2019 Last seen Cardiology [facility] [id number] Complication with CKD-III-IV  Plan Continue current med management per Cardiology Reviewed diet avoid high sodium foods

## 2018-08-31 NOTE — Patient Instructions (Addendum)
Thank you for coming to the office today.  Keep your follow-up apt with [facility] Infectious Disease for Toe   Next Monday 2/24   Tobias Alexander, MD   Infectious Diseases   NPI: [id number]   [place] [address]      Phone: [phone]    Recent Labs    02/23/18 0837 05/28/18 0831 08/31/18 0820  HGBA1C 9.4* 11.9* 10.6*   Keep on track with medicines, we will make sure you have enough insulin pen needles  Use sample for now to get by until can get more from pharmacy.   Please schedule a Follow-up Appointment to: Return in about 3 months (around 11/29/2018) for DM A1c, HTN, Toe Ulcer.  If you have any other questions or concerns, please feel free to call the office or send a message through [place]. You may also schedule an earlier appointment if necessary.  Additionally, you may be receiving a survey about your experience at our office within a few days to 1 week by e-mail or mail. We value your feedback.  [name], DO [place]

## 2018-08-31 NOTE — Assessment & Plan Note (Signed)
Improving A1c now back on medications from 11.9 down to 10.6 No hypoglycemia Complications - Hyperglycemia, CKD-III, peripheral neuropathy, DM retinopathy, DM ulceration Meds - contraindicated Metformin with CKD / Failed Bydureon BCise x 2 trials (nausea)  Plan:  1.  Continue Lantus 40u daily, Victoza 1.2 u daily - NEW order pen needles for 60 per month 180 per 3 month - sent 200 to pharmacy - Given sample basaglar for 7 pen needles and pen to use until can get needles from pharmacy 2. Encourage improved lifestyle - low carb, low sugar diet, reduce portion size, continue improving regular exercise 3. Check CBG, bring log to next visit for review 4. Continue ASA, ARB, Statin - restart statin again today - sent refill 5. Follow-up 3 months DM A1c  If not improved A1c, strongly consider referral to Endocrinology for additional assistance.

## 2018-09-01 ENCOUNTER — Encounter: Payer: Medicaid Other | Admitting: Physician Assistant

## 2018-09-01 DIAGNOSIS — E11621 Type 2 diabetes mellitus with foot ulcer: Secondary | ICD-10-CM | POA: Diagnosis not present

## 2018-09-01 DIAGNOSIS — Z8249 Family history of ischemic heart disease and other diseases of the circulatory system: Secondary | ICD-10-CM | POA: Diagnosis not present

## 2018-09-01 DIAGNOSIS — L97514 Non-pressure chronic ulcer of other part of right foot with necrosis of bone: Secondary | ICD-10-CM | POA: Diagnosis not present

## 2018-09-02 NOTE — Progress Notes (Signed)
Harold, [name] ([id number]) Visit Report for 09/01/2018 Chief Complaint Document Details Patient Name: [name], [name]. Date of Service: 09/01/2018 9:00 AM Medical Record Number: [id number] Patient Account Number: [id number] Date of Birth/Sex: 1961-04-26 (58 y.o. M) Treating RN: Montey Hora Primary Care Provider: [name] Other Clinician: Referring Provider: [name] Treating Provider/Extender: Melburn Hake, Aayat Hajjar Weeks in Treatment: 6 Information Obtained from: Patient Chief Complaint Right great toe ulcer Electronic Signature(s) Signed: 09/01/2018 5:20:18 PM By: Worthy Keeler PA-C Entered By: Worthy Keeler on 09/01/2018 08:44:56 [name] ([id number]) -------------------------------------------------------------------------------- Debridement Details Patient Name: [name]. Date of Service: 09/01/2018 9:00 AM Medical Record Number: [id number] Patient Account Number: [id number] Date of Birth/Sex: 1961/02/16 (58 y.o. M) Treating RN: Montey Hora Primary Care Provider: [name] Other Clinician: Referring Provider: [name] Treating Provider/Extender: Melburn Hake, Deklyn Trachtenberg Weeks in Treatment: 6 Debridement Performed for Wound #1 Right,Plantar Toe Great Assessment: Performed By: Physician STONE III, Whit Bruni E., PA-C Debridement Type: Debridement Severity of Tissue Pre Necrosis of bone Debridement: Level of Consciousness (Pre- Awake and Alert procedure): Pre-procedure Verification/Time Yes - 09:03 Out Taken: Start Time: 09:03 Pain Control: Lidocaine 4% Topical Solution Total Area Debrided (L x W): 1.5 (cm) x 0.7 (cm) = 1.05 (cm) Tissue and other material Viable, Non-Viable, Bone, Callus, Slough, Subcutaneous, Slough debrided: Level: Skin/Subcutaneous Tissue/Muscle/Bone Debridement Description: Excisional Instrument: Curette Bleeding: Minimum Hemostasis Achieved: Pressure End Time:  09:07 Procedural Pain: 0 Post Procedural Pain: 0 Response to Treatment: Procedure was tolerated well Level of Consciousness Awake and Alert (Post-procedure): Post Debridement Measurements of Total Wound Length: (cm) 1.5 Width: (cm) 0.7 Depth: (cm) 0.6 Volume: (cm) 0.495 Character of Wound/Ulcer Post Debridement: Improved Severity of Tissue Post Debridement: Necrosis of bone Post Procedure Diagnosis Same as Pre-procedure Electronic Signature(s) Signed: 09/01/2018 4:18:49 PM By: Montey Hora Signed: 09/01/2018 5:20:18 PM By: Worthy Keeler PA-C Entered By: Montey Hora on 09/01/2018 [place] ([id number]) -------------------------------------------------------------------------------- HPI Details Patient Name: [name]. Date of Service: 09/01/2018 9:00 AM Medical Record Number: [id number] Patient Account Number: [id number] Date of Birth/Sex: 06/01/1961 (58 y.o. M) Treating RN: Montey Hora Primary Care Provider: [name] Other Clinician: Referring Provider: [name] Treating Provider/Extender: Melburn Hake, Dain Laseter Weeks in Treatment: 6 History of Present Illness HPI Description: 07/20/17 on evaluation today patient actually appears to be doing somewhat poorly in regard to his right plantar great toe ulcer. This is something that he states started as what he thought to be a blister May 04, 2018. Subsequently he treated this on his own for a while thinking that it would just get better. As it did not get better he eventually did go to have this evaluated where it was treated appropriately with it sounds like Bactroban ointment however the patient states that it never completely resolved. Subsequently if this continue to get worse and was looking more irritated and inflamed he did go to the hospital more recently where he was prescribed Keflex. Subsequently he was also referred to the wound care center here for further  evaluation by [place]. He had an x-ray in the ER which revealed no evidence of osteomyelitis that I explained to the patient that osteomyelitis can be present even in a negative x-ray as it's not nearly as sensitive as an MRI would be. No fevers, chills, nausea, or vomiting noted at this time. The patient tells me that this might look a little bit better since he started the antibiotics. 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 This is mainly centered in the shin region. 07/27/18 upon evaluation today patient appears to be doing fairly well in regard to his toe ulcer all things considered. 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 This obviously is good news. With that being said I do think that he did have some infection which obviously calls some of the necrosis although the wound seems to be doing better he is definitely tolerating the Cipro which I switched into based on the bone culture and seems to be dramatically improving in this regard. Overall I'm pleased with how things stand. 08/03/18 on evaluation today patient appears to be doing rather well in regard to his toe ulcer all things considering. Again at this point the patient's wound shows signs of improvement although this is slow it still seems to be doing very well in my pinion. Fortunately there does not seem to show any signs of infection as far as the overall appearance of the wound bed is concerned at least nothing worsening. Overall the redness and erythema has dramatically improved. I'm very pleased. Patient does have his infectious disease consult on February 3 and his MRI is actually scheduled for the 23rd of this month which  is just three days away. 08/10/18 on evaluation today patient actually appears to be doing okay in regard to his toe ulcer. It does not appear to be as inflamed as it was previous. 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 He still has bone exposed in the base of the wound. I am concerned about how well this is actually going to heal or not at this point. Overall I do believe that the Cipro has been of benefit for him and I will continue this one more week until he sees infectious disease next Monday. I'm also gonna see about however getting appointment for him to see podiatry to discuss his options. 08/18/18 on evaluation today patient's wound actually appears to be doing decently well. He does have bone noted at this point in the base of the wound still although there does not appear to be as much necrotic bone as there was previous. He has been tolerating the Cipro without any complications. He did subsequently have an appointment with infectious disease which was supposed to be yesterday. However they called him when he was getting the car to leave to go in order to tell him that they would not be able to see him due to the doctor being out and rescheduled him for the 24th which is three weeks away. To be honest I'm not very pleased with that scenario. Nonetheless the patient tells me as well today that he is not even sure that he would want to go see a podiatrist at this point he really wants to do whatever he can to avoid surgery. 08/25/18 on evaluation today patient's wound continues to show signs of necrotic bone in the base of the wound as well is necrotic  tissue in general. He does still have your theme of the great toe he's currently on the oral antibiotics while  we are still awaiting the referral to infectious disease which is scheduled for the 24th of this month. At this point I did have a longer conversation today with him concerning hyperbaric option therapy and how considering he doesn't want to have any surgery DEAKIN, LACEK. ([id number]) wants to try to manage this medically it could potentially help in getting this to resolve. Fortunately there does not appear to be any signs of infection at this time systemically. No fevers, chills, nausea, or vomiting noted at this time. He does have a history of having had some heart disease with what sounds to be potentially stand I would have to get an updated EKG prior to HBO therapy. Nonetheless he is not a smoker and has no lung conditions. I'm not sure if you set in echo but if not we may have to see about this as well prior to initiating therapy to ensure his ejection fraction is sufficient. I will look further into this as well. 09/01/18 on evaluation today patient's toe actually appears to be doing about the same may be slightly better in regard to the overall quality of the wound bed although he still has bone exposed some of this will still requiring debridement today due to be necrotic on the tip. Fortunately there's no signs of worsening infection. We did get approval for hyperbaric oxygen therapy for the patient although he states that he thought about it and really does not want to do that. He would like to see infectious disease first at least and he has an appointment with them on Monday. Subsequently following that appointment I'll see him on Tuesday and then we'll have a longer discussion about what we're gonna do. His toe again also showed a pathologic fracture and I think this is also a big part of the issue as well. Again as I explained to the patient today and have previous I think that the hyperbaric oxygen therapy would be of great benefit for him as far as trying to clear out the  infection especially in light of the fact that he really doesn't seem to be progressing quite as well as I would like is still has necrotic bone noted in the base of the wound. Obviously IV antibiotics will help as well I have referred him to fix disease unfortunately the beginning of month is appointment got pushed to the end of this month that actually is on Monday. Subsequently we're gonna see were things stand once they see him. Electronic Signature(s) Signed: 09/01/2018 5:20:18 PM By: Worthy Keeler PA-C Entered By: Worthy Keeler on 09/01/2018 11:02:47 FISHER, HARGADON ([id number]) -------------------------------------------------------------------------------- Physical Exam Details Patient Name: ERIQUE, KASER. Date of Service: 09/01/2018 9:00 AM Medical Record Number: [id number] Patient Account Number: [id number] Date of Birth/Sex: 1960-07-27 (58 y.o. M) Treating RN: Montey Hora Primary Care Provider: [name] Other Clinician: Referring Provider: [name] Treating Provider/Extender: STONE III, Reeder Brisby Weeks in Treatment: 6 Constitutional Well-nourished and well-hydrated in no acute distress. Respiratory normal breathing without difficulty. Psychiatric this patient is able to make decisions and demonstrates good insight into disease process. Alert and Oriented x 3. pleasant and cooperative. Notes Patient's wound bed currently shows signs of slight improvement as far as the quality of some of the granulation that we still has necrotic bone in the base of the wound overall it's really not any better. He  doesn't have any spreading erythema which is good news he also has no signs of systemic infection good news. No fevers, chills, nausea, or vomiting noted at this time. Debridement was performed to remove some of the necrotic debris from the wound surface today. Post debridement the wound bed appears to be doing much better. Electronic  Signature(s) Signed: 09/01/2018 5:20:18 PM By: Worthy Keeler PA-C Entered By: Worthy Keeler on 09/01/2018 11:03:18 [name] ([id number]) -------------------------------------------------------------------------------- Physician Orders Details Patient Name: SLOAN, TAKAGI. Date of Service: 09/01/2018 9:00 AM Medical Record Number: [id number] Patient Account Number: [id number] Date of Birth/Sex: May 09, 1961 (58 y.o. M) Treating RN: Montey Hora Primary Care Provider: [name] Other Clinician: Referring Provider: [name] Treating Provider/Extender: Melburn Hake, Hanin Decook Weeks in Treatment: 6 Verbal / Phone Orders: No Diagnosis Coding ICD-10 Coding Code Description E11.621 Type 2 diabetes mellitus with foot ulcer L97.514 Non-pressure chronic ulcer of other part of right foot with necrosis of bone I73.89 Other specified peripheral vascular diseases M86.071 Acute hematogenous osteomyelitis, right ankle and foot Wound Cleansing Wound #1 Right,Plantar Toe Great o May Shower, gently pat wound dry prior to applying new dressing. - Dial Antibacterial soap Anesthetic (add to Medication List) Wound #1 Right,Plantar Toe Great o Topical Lidocaine 4% cream applied to wound bed prior to debridement (In Clinic Only). Primary Wound Dressing o Silver Alginate Secondary Dressing o Conform/Kerlix Dressing Change Frequency Wound #1 Right,Plantar Toe Great o Change dressing every other day. Follow-up Appointments Wound #1 Right,Plantar Toe Great o Return Appointment in 1 week. Off-Loading Wound #1 Right,Plantar Toe Great o Open toe surgical shoe with peg assist. Medications-please add to medication list. Wound #1 Right,Plantar Toe Great o P.O. Antibiotics - Continue antibiotics Electronic Signature(s) Signed: 09/01/2018 4:18:49 PM By: Montey Hora Signed: 09/01/2018 5:20:18 PM By: Marrianne Mood, [place]  ([id number]) Entered By: Montey Hora on 09/01/2018 09:09:52 MARLEY, CHARLOT ([id number]) -------------------------------------------------------------------------------- Problem List Details Patient Name: REEF, ACHTERBERG. Date of Service: 09/01/2018 9:00 AM Medical Record Number: [id number] Patient Account Number: [id number] Date of Birth/Sex: 1960-09-04 (58 y.o. 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 Date of Service: 09/01/2018 9:00 AM Medical Record Number: [id number] Patient Account Number: [id number] Date of Birth/Sex: 20-Jan-1961 (58 y.o. M) Treating RN: Montey Hora Primary Care Provider: [name] Other Clinician: Referring Provider: [name] Treating Provider/Extender: Melburn Hake, Maisie Hauser Weeks in Treatment: 6 Subjective Chief Complaint Information obtained from Patient Right great toe ulcer History of Present Illness (HPI) 07/20/17 on evaluation today patient actually appears to be doing somewhat poorly in regard to his right plantar great toe ulcer. This  is something that he states started as what he thought to be a blister May 04, 2018. Subsequently he treated this on his own for a while thinking that it would just get better. As it did not  get better he eventually did go to have this evaluated where it was treated appropriately with it sounds like Bactroban ointment however the patient states that it never completely resolved. Subsequently if this continue to get worse and was looking more irritated and inflamed he did go to the hospital more recently where he was prescribed Keflex. Subsequently he was also referred to the wound care center here for further evaluation by [place]. He had an x-ray in the ER which revealed no evidence of osteomyelitis that I explained to the patient that osteomyelitis can be present even in a negative x-ray as it's not nearly as sensitive as an MRI would be. No fevers, chills, nausea, or vomiting noted at this time. The patient tells me that this might look a little bit better since he started the antibiotics. He does have a history of diabetes mellitus type II he also had a horse accident years ago and had to have significant surgery in regard to his right lower extremity that did leave some deformity in this regard. This is mainly centered in the shin region. 07/27/18 upon evaluation today patient appears to be doing fairly well in regard to his toe ulcer all things considered. 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 This obviously is good news. With that being said I do think that he did have some infection which obviously calls some of the necrosis although the wound seems to be doing better he is definitely tolerating the Cipro which I switched into based on the bone culture and seems to be dramatically improving in this regard. Overall I'm pleased with how things  stand. 08/03/18 on evaluation today patient appears to be doing rather well in regard to his toe ulcer all things considering. Again at this point the patient's wound shows signs of improvement although this is slow it still seems to be doing very well in my pinion. Fortunately there does not seem to show any signs of infection as far as the overall appearance of the wound bed is concerned at least nothing worsening. Overall the redness and erythema has dramatically improved. I'm very pleased. Patient does have his infectious disease consult on February 3 and his MRI is actually scheduled for the 23rd of this month which is just three days away. 08/10/18 on evaluation today patient actually appears to be doing okay in regard to his toe ulcer. It does not appear to be as inflamed as it was previous. 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 He still has bone exposed in the base of the wound. I am concerned about how well this is actually going to heal or not at this point. Overall I do believe that the Cipro has been of benefit for him and I will continue this one more week until he sees infectious disease next Monday. I'm also gonna see about however getting appointment for him to see podiatry to discuss his options. 08/18/18 on evaluation today patient's wound actually appears to be doing decently well. He does have bone noted at this point in the base of the wound still although there does not appear  to be as much necrotic bone as there was previous. He has been tolerating the Cipro without any complications. He did subsequently have an appointment with infectious disease which was supposed to be yesterday. However they called him when he was getting the car to leave to go  in order to tell him that they would not be able to see him due to the doctor being out and rescheduled him for the 24th which is three weeks away. To be honest I'm not very pleased with that scenario. Nonetheless the patient tells me as well today that he is not even sure that he would want to go see a podiatrist at this point he really wants to do whatever he can to avoid surgery. ASHTIN, ROSNER ([id number]) 08/25/18 on evaluation today patient's wound continues to show signs of necrotic bone in the base of the wound as well is necrotic tissue in general. He does still have your theme of the great toe he's currently on the oral antibiotics while we are still awaiting the referral to infectious disease which is scheduled for the 24th of this month. At this point I did have a longer conversation today with him concerning hyperbaric option therapy and how considering he doesn't want to have any surgery wants to try to manage this medically it could potentially help in getting this to resolve. Fortunately there does not appear to be any signs of infection at this time systemically. No fevers, chills, nausea, or vomiting noted at this time. He does have a history of having had some heart disease with what sounds to be potentially stand I would have to get an updated EKG prior to HBO therapy. Nonetheless he is not a smoker and has no lung conditions. I'm not sure if you set in echo but if not we may have to see about this as well prior to initiating therapy to ensure his ejection fraction is sufficient. I will look further into this as well. 09/01/18 on evaluation today patient's toe actually appears to be doing about the same may be slightly better in regard to the overall quality of the wound bed although he still has bone exposed some of this will still requiring debridement today due to be necrotic on the tip. Fortunately there's no signs of worsening infection. We did get approval for hyperbaric  oxygen therapy for the patient although he states that he thought about it and really does not want to do that. He would like to see infectious disease first at least and he has an appointment with them on Monday. Subsequently following that appointment I'll see him on Tuesday and then we'll have a longer discussion about what we're gonna do. His toe again also showed a pathologic fracture and I think this is also a big part of the issue as well. Again as I explained to the patient today and have previous I think that the hyperbaric oxygen therapy would be of great benefit for him as far as trying to clear out the infection especially in light of the fact that he really doesn't seem to be progressing quite as well as I would like is still has necrotic bone noted in the base of the wound. Obviously IV antibiotics will help as well I have referred him to fix disease unfortunately the beginning of month is appointment got pushed to the end of this month that actually is on Monday. Subsequently we're gonna see were things stand once they see him. Patient History Information  obtained from Patient. Family History Diabetes - Maternal Grandparents, Heart Disease - Mother, No family history of Cancer, Hereditary Spherocytosis, Hypertension, Kidney Disease, Lung Disease, Seizures, Stroke, Thyroid Problems, Tuberculosis. Social History Never smoker, Marital Status - Married, Alcohol Use - Moderate, Drug Use - No History, Caffeine Use - Daily. Medical History Hematologic/Lymphatic Denies history of Anemia, Hemophilia, Human Immunodeficiency Virus, Lymphedema, Sickle Cell Disease Respiratory Denies history of Aspiration, Asthma, Chronic Obstructive Pulmonary Disease (COPD), Pneumothorax, Sleep Apnea, Tuberculosis Gastrointestinal Denies history of Cirrhosis , Colitis, Crohn s, Hepatitis A, Hepatitis B, Hepatitis C Endocrine Patient has history of Type II Diabetes Denies history of Type I  Diabetes Genitourinary Denies history of End Stage Renal Disease Immunological Denies history of Lupus Erythematosus, Raynaud s, Scleroderma Integumentary (Skin) Denies history of History of Burn, History of pressure wounds Musculoskeletal Denies history of Gout, Rheumatoid Arthritis, Osteoarthritis, Osteomyelitis Neurologic Denies history of Dementia, Neuropathy, Quadriplegia, Paraplegia, Seizure Disorder Oncologic Denies history of Received Chemotherapy, Received Radiation LAMAR, METER ([id number]) Psychiatric Denies history of Anorexia/bulimia, Confinement Anxiety Review of Systems (ROS) Constitutional Symptoms (General Health) Denies complaints or symptoms of Fever, Chills. Respiratory The patient has no complaints or symptoms. Cardiovascular The patient has no complaints or symptoms. Psychiatric The patient has no complaints or symptoms. Objective Constitutional Well-nourished and well-hydrated in no acute distress. Vitals Time Taken: 8:44 AM, Height: 68 in, Weight: 210 lbs, BMI: 31.9, Temperature: 97.9 F, Pulse: 61 bpm, Respiratory Rate: 16 breaths/min, Blood Pressure: 175/83 mmHg. General Notes: Patient has not taken his blood pressure medication yet this morning. Respiratory normal breathing without difficulty. Psychiatric this patient is able to make decisions and demonstrates good insight into disease process. Alert and Oriented x 3. pleasant and cooperative. General Notes: Patient's wound bed currently shows signs of slight improvement as far as the quality of some of the granulation that we still has necrotic bone in the base of the wound overall it's really not any better. He doesn't have any spreading erythema which is good news he also has no signs of systemic infection good news. No fevers, chills, nausea, or vomiting noted at this time. Debridement was performed to remove some of the necrotic debris from the wound surface today. Post debridement the  wound bed appears to be doing much better. Integumentary (Hair, Skin) Wound #1 status is Open. Original cause of wound was Blister. The wound is located on the [employer]. The wound measures 1.5cm length x 0.7cm width x 0.4cm depth; 0.825cm^2 area and 0.33cm^3 volume. There is bone and Fat Layer (Subcutaneous Tissue) Exposed exposed. There is no tunneling or undermining noted. There is a medium amount of serous drainage noted. Foul odor after cleansing was noted. The wound margin is distinct with the outline attached to the wound base. There is small (1-33%) pink granulation within the wound bed. There is a small (1-33%) amount of necrotic tissue within the wound bed including Adherent Slough. The periwound skin appearance exhibited: Callus, Maceration. The periwound skin appearance did not exhibit: Crepitus, Excoriation, Induration, Rash, Scarring, Dry/Scaly, Atrophie Blanche, Cyanosis, Ecchymosis, Hemosiderin Staining, Mottled, Pallor, Rubor, Erythema. Periwound temperature was noted as No Abnormality. The periwound has tenderness on palpation. 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 There was a Excisional Skin/Subcutaneous Tissue/Muscle/Bone Debridement with a total area of 1.05 sq cm performed by STONE III, Natalin Bible E., PA-C. With the following instrument(s): Curette to remove Viable and Non-Viable tissue/material. Material removed includes Bone,Callus, Subcutaneous Tissue, and Slough after achieving pain control using Lidocaine 4% Topical  Solution. No specimens were taken. A time out was conducted at 09:03, prior to the start of the procedure. A Minimum amount of bleeding was controlled with Pressure. The procedure was tolerated well with a pain level of 0 throughout and a pain level of 0 following the procedure. Post Debridement Measurements: 1.5cm length x 0.7cm width x 0.6cm depth; 0.495cm^3 volume. Character of Wound/Ulcer Post Debridement is improved. Severity of Tissue Post Debridement is: Necrosis of bone. Post procedure Diagnosis Wound #1: Same as Pre-Procedure Plan Wound Cleansing: Wound #1 Right,Plantar Toe Great: May Shower, gently pat wound dry prior to applying new dressing. - Dial Antibacterial soap Anesthetic (add to Medication List): Wound #1 Right,Plantar Toe Great: Topical Lidocaine 4% cream applied to wound bed prior to debridement (In Clinic Only). Primary Wound Dressing: Silver Alginate Secondary Dressing: Conform/Kerlix Dressing Change Frequency: Wound #1 Right,Plantar Toe Great: Change dressing every other day. Follow-up Appointments: Wound #1 Right,Plantar Toe Great: Return Appointment in 1 week. Off-Loading: Wound #1 Right,Plantar Toe Great: Open toe surgical shoe with peg assist. Medications-please add to medication list.: Wound #1 Right,Plantar Toe Great: P.O. Antibiotics - Continue antibiotics ORAN, DILLENBURG ([id number]) Yetta Flock suggest that we continue with the above wound care measures for the next week. The patient is in agreement the plan. With that being said I do believe that he is a candidate to potentially have hyperbaric oxygen therapy I do believe I'll be antibiotics are also gonna be of benefit for him. Nonetheless will wait and see what infectious disease has on Monday per patient request. He did indicate to me today that if you is gonna have to go through all that that he may just look toward amputating the tone. Again that's definitely his decision that I explained from  a wound care perspective our goal is to try and prevent amputation if it all possible. Nonetheless even in the beginning I had recommended referring to a surgeon he wanted to hold off on that. We will see what he things after discussing with the infectious disease specialist next week. Please see above for specific wound care orders. We will see patient for re-evaluation in 1 week(s) here in the clinic. If anything worsens or changes patient will contact our office for additional recommendations. Electronic Signature(s) Signed: 09/01/2018 5:20:18 PM By: Worthy Keeler PA-C Entered By: Worthy Keeler on 09/01/2018 11:03:36 [name] ([id number]) -------------------------------------------------------------------------------- ROS/PFSH Details Patient Name: JEF, FUTCH. Date of Service: 09/01/2018 9:00 AM Medical Record Number: [id number] Patient Account Number: [id number] Date of Birth/Sex: 1961/06/06 (58 y.o. M) Treating RN: Montey Hora Primary Care Provider: [name] Other Clinician: Referring Provider: [name] Treating Provider/Extender: Melburn Hake, Kawthar Ennen Weeks in Treatment: 6 Information Obtained From Patient Wound History Do you currently have one or more open woundso No Have you tested positive for osteomyelitis (bone infection)o No Have you had any tests for circulation on your legso No Have you had other problems associated with your woundso Swelling Constitutional Symptoms (General Health) Complaints and Symptoms: Negative for: Fever; Chills Hematologic/Lymphatic Medical History: Negative for: Anemia; Hemophilia; Human Immunodeficiency Virus; Lymphedema; Sickle Cell Disease Respiratory Complaints  and Symptoms: No Complaints or Symptoms Medical History: Negative for: Aspiration; Asthma; Chronic Obstructive Pulmonary Disease (COPD); Pneumothorax; Sleep Apnea; Tuberculosis Cardiovascular Complaints and Symptoms: No Complaints or  Symptoms Gastrointestinal Medical History: Negative for: Cirrhosis ; Colitis; Crohnos; Hepatitis A; Hepatitis B; Hepatitis C Endocrine Medical History: Positive for: Type II Diabetes Negative for: Type I Diabetes Treated with: Insulin Blood sugar tested every day: No Genitourinary ARON, INGE ([id number]) Medical History: Negative for: End Stage Renal Disease Immunological Medical History: Negative for: Lupus Erythematosus; Raynaudos; Scleroderma Integumentary (Skin) Medical History: Negative for: History of Burn; History of pressure wounds Musculoskeletal Medical History: Negative for: Gout; Rheumatoid Arthritis; Osteoarthritis; Osteomyelitis Neurologic Medical History: Negative for: Dementia; Neuropathy; Quadriplegia; Paraplegia; Seizure Disorder Oncologic Medical History: Negative for: Received Chemotherapy; Received Radiation Psychiatric Complaints and Symptoms: No Complaints or Symptoms Medical History: Negative for: Anorexia/bulimia; Confinement Anxiety Immunizations Pneumococcal Vaccine: Received Pneumococcal Vaccination: No Implantable Devices Family and Social History Cancer: No; Diabetes: Yes - Maternal Grandparents; Heart Disease: Yes - Mother; Hereditary Spherocytosis: No; Hypertension: No; Kidney Disease: No; Lung Disease: No; Seizures: No; Stroke: No; Thyroid Problems: No; Tuberculosis: No; Never smoker; Marital Status - Married; Alcohol Use: Moderate; Drug Use: No History; Caffeine Use: Daily; Financial Concerns: No; Food, Clothing or Shelter Needs: No; Support System Lacking: No; Transportation Concerns: No; Advanced Directives: No; Patient does not want information on Advanced Directives; Do not resuscitate: No; Living Will: No; Medical Power of Attorney: No Physician Affirmation I have reviewed and agree with the above information. Electronic Signature(s) Signed: 09/01/2018 4:18:49 PM By: Montey Hora Signed: 09/01/2018 5:20:18 PM By: Worthy Keeler PA-C Entered By: Worthy Keeler on 09/01/2018 11:03:01 RAYBURN, MUNDIS ([id number]) CLAUDY, ABDALLAH ([id number]) -------------------------------------------------------------------------------- SuperBill Details Patient Name: BRADDOCK, SERVELLON. Date of Service: 09/01/2018 Medical Record Number: [id number] Patient Account Number: [id number] Date of Birth/Sex: August 17, 1960 (58 y.o. M) Treating RN: Montey Hora Primary Care Provider: [name] Other Clinician: Referring Provider: [name] Treating Provider/Extender: Melburn Hake, Acquanetta Cabanilla Weeks in Treatment: 6 Diagnosis Coding ICD-10 Codes Code Description E11.621 Type 2 diabetes mellitus with foot ulcer L97.514 Non-pressure chronic ulcer of other part of right foot with necrosis of bone I73.89 Other specified peripheral vascular diseases M86.071 Acute hematogenous osteomyelitis, right ankle and foot Facility Procedures CPT4 Code: 85462703 Description: 50093 - DEB BONE 20 SQ CM/< ICD-10 Diagnosis Description L97.514 Non-pressure chronic ulcer of other part of right foot with ne Modifier: crosis of bone Quantity: 1 Physician Procedures CPT4: Description Modifier Quantity Code [id number] Debridement; bone (includes epidermis, dermis, subQ tissue, muscle and/or fascia, if 1 performed) 1st 20 sqcm or less ICD-10 Diagnosis Description L97.514 Non-pressure chronic ulcer of other part of right  foot with necrosis of bone Electronic Signature(s) Signed: 09/01/2018 5:20:18 PM By: Worthy Keeler PA-C Entered By: Worthy Keeler on 09/01/2018 11:03:45

## 2018-09-05 NOTE — Progress Notes (Addendum)
[name], [name] ([id number]) Visit Report for 09/01/2018 Arrival Information Details Patient Name: [name], [name]. Date of Service: 09/01/2018 9:00 AM Medical Record Number: [id number] Patient Account Number: [id number] Date of Birth/Sex: 10/19/1960 (58 y.o. M) Treating RN: Montey Hora Primary Care Tuleen Mandelbaum: [name] Other Clinician: Referring Tyler Cubit: [name] Treating Shaneya Taketa/Extender: Melburn Hake, HOYT Weeks in Treatment: 6 Visit Information History Since Last Visit Added or deleted any medications: No Patient Arrived: Ambulatory Any new allergies or adverse reactions: No Arrival Time: 08:43 Had a fall or experienced change in No Accompanied By: self activities of daily living that may affect Transfer Assistance: None risk of falls: Patient Identification Verified: Yes Signs or symptoms of abuse/neglect since last visito No Secondary Verification Process Completed: Yes Hospitalized since last visit: No Implantable device outside of the clinic excluding No cellular tissue based products placed in the center since last visit: Has Dressing in Place as Prescribed: Yes Pain Present Now: No Electronic Signature(s) Signed: 09/01/2018 4:31:59 PM By: Lorine Bears RCP, RRT, CHT Entered By: Lorine Bears on 09/01/2018 08:44:13 [name] ([id number]) -------------------------------------------------------------------------------- Encounter Discharge Information Details Patient Name: [name], [name]. Date of Service: 09/01/2018 9:00 AM Medical Record Number: [id number] Patient Account Number: [id number] Date of Birth/Sex: 1961/02/08 (58 y.o. M) Treating RN: Montey Hora Primary Care Yazmina Pareja: [name] Other Clinician: Referring Errica Dutil: [name] Treating Nicolina Hirt/Extender: Melburn Hake, HOYT Weeks in Treatment: 6 Encounter Discharge Information Items Post Procedure Vitals Discharge  Condition: Stable Temperature (F): 97.9 Ambulatory Status: Ambulatory Pulse (bpm): 61 Discharge Destination: Home Respiratory Rate (breaths/min): 18 Transportation: Private Auto Blood Pressure (mmHg): 175/83 Accompanied By: self Schedule Follow-up Appointment: Yes Clinical Summary of Care: Electronic Signature(s) Signed: 09/01/2018 9:23:38 AM By: Montey Hora Entered By: Montey Hora on 09/01/2018 09:23:38 [name] ([id number]) -------------------------------------------------------------------------------- Lower Extremity Assessment Details Patient Name: [name]. Date of Service: 09/01/2018 9:00 AM Medical Record Number: [id number] Patient Account Number: [id number] Date of Birth/Sex: 06-27-1961 (58 y.o. M) Treating RN: Army Melia Primary Care Biana Haggar: [name] Other Clinician: Referring Jerrian Mells: [name] Treating Carolyn Maniscalco/Extender: Melburn Hake, HOYT Weeks in Treatment: 6 Edema Assessment Assessed: [Left: No] [Right: No] Edema: [Left: N] [Right: o] Vascular Assessment Pulses: Dorsalis Pedis Palpable: [Right:Yes] Posterior Tibial Extremity colors, hair growth, and conditions: Extremity Color: [Right:Normal] Hair Growth on Extremity: [Right:Yes] Temperature of Extremity: [Right:Warm] Capillary Refill: [Right:< 3 seconds] Toe Nail Assessment Left: Right: Thick: Yes Discolored: Yes Deformed: No Improper Length and Hygiene: No Electronic Signature(s) Signed: 09/04/2018 4:09:31 PM By: Army Melia Entered By: Army Melia on 09/01/2018 08:51:27 [name] ([id number]) -------------------------------------------------------------------------------- Multi Wound Chart Details Patient Name: [name]. Date of Service: 09/01/2018 9:00 AM Medical Record Number: [id number] Patient Account Number: [id number] Date of Birth/Sex: [birth year] (58 y.o. M) Treating RN: Montey Hora Primary Care Britten Seyfried:  [name] Other Clinician: Referring Linna Thebeau: [name] Treating Loveta Dellis/Extender: Melburn Hake, HOYT Weeks in Treatment: 6 Vital Signs Height(in): 68 Pulse(bpm): 61 Weight(lbs): 210 Blood Pressure(mmHg): 175/83 Body Mass Index(BMI): 32 Temperature(F): 97.9 Respiratory Rate 16 (breaths/min): Photos: [1:No Photos] [N/A:N/A] Wound Location: [1:Right Toe Great - Plantar] [N/A:N/A] Wounding Event: [1:Blister] [N/A:N/A] Primary Etiology: [1:Diabetic Wound/Ulcer of the Lower Extremity] [N/A:N/A] Comorbid History: [1:Type II Diabetes] [N/A:N/A] Date Acquired: [1:05/04/2018] [N/A:N/A] Weeks of Treatment: [1:6] [N/A:N/A] Wound Status: [1:Open] [N/A:N/A] Pending Amputation on [1:Yes] [N/A:N/A] Presentation: Measurements L x W x D [1:1.5x0.7x0.4] [N/A:N/A] (cm) Area (cm) : [1:0.825] [N/A:N/A] Volume (cm) : [1:0.33] [N/A:N/A] % Reduction in Area: [1:60.50%] [N/A:N/A] % Reduction in Volume: [1:60.50%] [  N/A:N/A] Classification: [1:Grade 3] [N/A:N/A] Exudate Amount: [1:Medium] [N/A:N/A] Exudate Type: [1:Serous] [N/A:N/A] Exudate Color: [1:amber] [N/A:N/A] Foul Odor After Cleansing: [1:Yes] [N/A:N/A] Odor Anticipated Due to [1:No] [N/A:N/A] Product Use: Wound Margin: [1:Distinct, outline attached] [N/A:N/A] Granulation Amount: [1:Small (1-33%)] [N/A:N/A] Granulation Quality: [1:Pink] [N/A:N/A] Necrotic Amount: [1:Small (1-33%)] [N/A:N/A] Exposed Structures: [1:Fat Layer (Subcutaneous Tissue) Exposed: Yes Bone: Yes Fascia: No Tendon: No Muscle: No Joint: No] [N/A:N/A] Epithelialization: [1:Small (1-33%)] [N/A:N/A] Periwound Skin Texture: [N/A:N/A] Callus: Yes Excoriation: No Induration: No Crepitus: No Rash: No Scarring: No Periwound Skin Moisture: Maceration: Yes N/A N/A Dry/Scaly: No Periwound Skin Color: Atrophie Blanche: No N/A N/A Cyanosis: No Ecchymosis: No Erythema: No Hemosiderin Staining: No Mottled: No Pallor: No Rubor:  No Temperature: No Abnormality N/A N/A Tenderness on Palpation: Yes N/A N/A Wound Preparation: Ulcer Cleansing: N/A N/A Rinsed/Irrigated with Saline Topical Anesthetic Applied: Other: lidocaine 4% Treatment Notes Electronic Signature(s) Signed: 09/01/2018 4:18:49 PM By: Montey Hora Entered By: Montey Hora on 09/01/2018 09:04:18 [name] ([id number]) -------------------------------------------------------------------------------- Phil Campbell Details Patient Name: [name], [name]. Date of Service: 09/01/2018 9:00 AM Medical Record Number: [id number] Patient Account Number: [id number] Date of Birth/Sex: 07-26-1960 (58 y.o. M) Treating RN: Montey Hora Primary Care Josephine Rudnick: [name] Other Clinician: Referring Dilara Navarrete: [name] Treating Trenity Pha/Extender: Melburn Hake, HOYT Weeks in Treatment: 6 Active Inactive Electronic Signature(s) Signed: 09/24/2018 10:42:56 AM By: Gretta Cool, BSN, RN, CWS, Kim RN, BSN Signed: 10/08/2018 10:14:26 AM By: Montey Hora Previous Signature: 09/01/2018 4:18:49 PM Version By: Montey Hora Entered By: Gretta Cool BSN, RN, CWS, Kim on 09/24/2018 10:42:55 [name], [name] ([id number]) -------------------------------------------------------------------------------- Pain Assessment Details Patient Name: [name], [name]. Date of Service: 09/01/2018 9:00 AM Medical Record Number: [id number] Patient Account Number: [id number] Date of Birth/Sex: 1961/04/27 (58 y.o. M) Treating RN: Montey Hora Primary Care Aragorn Recker: [name] Other Clinician: Referring Adiya Selmer: [name] Treating Everitt Wenner/Extender: Melburn Hake, HOYT Weeks in Treatment: 6 Active Problems Location of Pain Severity and Description of Pain Patient Has Paino No Site Locations Pain Management and Medication Current Pain Management: Electronic Signature(s) Signed: 09/01/2018 4:18:49 PM By: Montey Hora Signed:  09/01/2018 4:31:59 PM By: Lorine Bears RCP, RRT, CHT Entered By: Lorine Bears on 09/01/2018 08:44:21 [name] ([id number]) -------------------------------------------------------------------------------- Patient/Caregiver Education Details Patient Name: [name]. Date of Service: 09/01/2018 9:00 AM Medical Record Number: [id number] Patient Account Number: [id number] Date of Birth/Gender: October 30, 1960 (58 y.o. M) Treating RN: Montey Hora Primary Care Physician: [name] Other Clinician: Referring Physician: [name] Treating Physician/Extender: Sharalyn Ink in Treatment: 6 Education Assessment Education Provided To: Patient Education Topics Provided Hyperbaric Oxygenation: Handouts: Other: purpose of HBOT Methods: Explain/Verbal Responses: State content correctly Wound/Skin Impairment: Handouts: Other: wound care to continue as ordered Methods: Demonstration, Explain/Verbal Responses: State content correctly Electronic Signature(s) Signed: 09/01/2018 4:18:49 PM By: Montey Hora Entered By: Montey Hora on 09/01/2018 09:24:06 [name] ([id number]) -------------------------------------------------------------------------------- Wound Assessment Details Patient Name: [name]. Date of Service: 09/01/2018 9:00 AM Medical Record Number: [id number] Patient Account Number: [id number] Date of Birth/Sex: [birth year] (57 y.o. M) Treating RN: Army Melia Primary Care Riti Rollyson: [name] Other Clinician: Referring Zylah Elsbernd: [name] Treating Allayah Raineri/Extender: STONE III, HOYT Weeks in Treatment: 6 Wound Status Wound Number: 1 Primary Etiology: Diabetic Wound/Ulcer of the Lower Extremity Wound Location: Right Toe Great - Plantar Wound Status: Open Wounding Event: Blister Comorbid Type II Diabetes Date Acquired: 05/04/2018 History: Weeks Of  Treatment: 6 Clustered Wound: No Pending Amputation On Presentation Photos Photo Uploaded By: Nicki Reaper,  Dajea on 09/01/2018 11:27:23 Wound Measurements Length: (cm) 1.5 Width: (cm) 0.7 Depth: (cm) 0.4 Area: (cm) 0.825 Volume: (cm) 0.33 % Reduction in Area: 60.5% % Reduction in Volume: 60.5% Epithelialization: Small (1-33%) Tunneling: No Undermining: No Wound Description Classification: Grade 3 Foul Odo Wound Margin: Distinct, outline attached Due to P Exudate Amount: Medium Slough/F Exudate Type: Serous Exudate Color: amber r After Cleansing: Yes roduct Use: No ibrino Yes Wound Bed Granulation Amount: Small (1-33%) Exposed Structure Granulation Quality: Pink Fascia Exposed: No Necrotic Amount: Small (1-33%) Fat Layer (Subcutaneous Tissue) Exposed: Yes Necrotic Quality: Adherent Slough Tendon Exposed: No Muscle Exposed: No Joint Exposed: No Bone Exposed: Yes Periwound Skin Texture [name], [name]. ([id number]) Texture Color No Abnormalities Noted: No No Abnormalities Noted: No Callus: Yes Atrophie Blanche: No Crepitus: No Cyanosis: No Excoriation: No Ecchymosis: No Induration: No Erythema: No Rash: No Hemosiderin Staining: No Scarring: No Mottled: No Pallor: No Moisture Rubor: No No Abnormalities Noted: No Dry / Scaly: No Temperature / Pain Maceration: Yes Temperature: No Abnormality Tenderness on Palpation: Yes Wound Preparation Ulcer Cleansing: Rinsed/Irrigated with Saline Topical Anesthetic Applied: Other: lidocaine 4%, Electronic Signature(s) Signed: 09/04/2018 4:09:31 PM By: Army Melia Entered By: Army Melia on 09/01/2018 08:51:05 [name] ([id number]) -------------------------------------------------------------------------------- Vitals Details Patient Name: [name]. Date of Service: 09/01/2018 9:00 AM Medical Record Number: [id number] Patient Account Number: [id number] Date of Birth/Sex: [birth year] (58 y.o.  M) Treating RN: Montey Hora Primary Care Chigozie Basaldua: [name] Other Clinician: Referring Vermon Grays: [name] Treating Judah Carchi/Extender: Melburn Hake, HOYT Weeks in Treatment: 6 Vital Signs Time Taken: 08:44 Temperature (F): 97.9 Height (in): 68 Pulse (bpm): 61 Weight (lbs): 210 Respiratory Rate (breaths/min): 16 Body Mass Index (BMI): 31.9 Blood Pressure (mmHg): 175/83 Reference Range: 80 - 120 mg / dl Notes Patient has not taken his blood pressure medication yet this morning. Electronic Signature(s) Signed: 09/01/2018 4:31:59 PM By: Lorine Bears RCP, RRT, CHT Entered By: Lorine Bears on 09/01/2018 08:47:32

## 2018-09-07 DIAGNOSIS — M84474A Pathological fracture, right foot, initial encounter for fracture: Secondary | ICD-10-CM | POA: Diagnosis not present

## 2018-09-07 DIAGNOSIS — M84674A Pathological fracture in other disease, right foot, initial encounter for fracture: Secondary | ICD-10-CM | POA: Diagnosis not present

## 2018-09-07 DIAGNOSIS — I447 Left bundle-branch block, unspecified: Secondary | ICD-10-CM | POA: Diagnosis not present

## 2018-09-07 DIAGNOSIS — M7989 Other specified soft tissue disorders: Secondary | ICD-10-CM | POA: Diagnosis not present

## 2018-09-07 DIAGNOSIS — M869 Osteomyelitis, unspecified: Secondary | ICD-10-CM | POA: Diagnosis not present

## 2018-09-07 DIAGNOSIS — L97519 Non-pressure chronic ulcer of other part of right foot with unspecified severity: Secondary | ICD-10-CM | POA: Diagnosis not present

## 2018-09-08 ENCOUNTER — Ambulatory Visit: Payer: Medicaid Other | Admitting: Physician Assistant

## 2018-09-08 DIAGNOSIS — M869 Osteomyelitis, unspecified: Secondary | ICD-10-CM | POA: Diagnosis not present

## 2018-09-08 DIAGNOSIS — M86171 Other acute osteomyelitis, right ankle and foot: Secondary | ICD-10-CM | POA: Diagnosis not present

## 2018-09-10 DIAGNOSIS — Z89411 Acquired absence of right great toe: Secondary | ICD-10-CM | POA: Diagnosis not present

## 2018-09-10 DIAGNOSIS — M869 Osteomyelitis, unspecified: Secondary | ICD-10-CM | POA: Diagnosis not present

## 2018-10-09 DIAGNOSIS — E1140 Type 2 diabetes mellitus with diabetic neuropathy, unspecified: Secondary | ICD-10-CM | POA: Diagnosis not present

## 2018-10-09 DIAGNOSIS — Z09 Encounter for follow-up examination after completed treatment for conditions other than malignant neoplasm: Secondary | ICD-10-CM | POA: Diagnosis not present

## 2018-10-09 DIAGNOSIS — N289 Disorder of kidney and ureter, unspecified: Secondary | ICD-10-CM | POA: Diagnosis not present

## 2018-10-09 DIAGNOSIS — M25374 Other instability, right foot: Secondary | ICD-10-CM | POA: Diagnosis not present

## 2018-10-09 DIAGNOSIS — M86171 Other acute osteomyelitis, right ankle and foot: Secondary | ICD-10-CM | POA: Diagnosis not present

## 2018-10-09 DIAGNOSIS — Z89421 Acquired absence of other right toe(s): Secondary | ICD-10-CM | POA: Diagnosis not present

## 2018-10-09 DIAGNOSIS — Z89411 Acquired absence of right great toe: Secondary | ICD-10-CM | POA: Diagnosis not present

## 2018-10-30 DIAGNOSIS — L03115 Cellulitis of right lower limb: Secondary | ICD-10-CM | POA: Diagnosis not present

## 2018-10-30 DIAGNOSIS — E11621 Type 2 diabetes mellitus with foot ulcer: Secondary | ICD-10-CM | POA: Diagnosis not present

## 2018-10-30 DIAGNOSIS — Z89421 Acquired absence of other right toe(s): Secondary | ICD-10-CM | POA: Diagnosis not present

## 2018-10-30 DIAGNOSIS — L97412 Non-pressure chronic ulcer of right heel and midfoot with fat layer exposed: Secondary | ICD-10-CM | POA: Diagnosis not present

## 2018-10-30 DIAGNOSIS — L97519 Non-pressure chronic ulcer of other part of right foot with unspecified severity: Secondary | ICD-10-CM | POA: Diagnosis not present

## 2018-11-09 DIAGNOSIS — E11621 Type 2 diabetes mellitus with foot ulcer: Secondary | ICD-10-CM | POA: Diagnosis not present

## 2018-11-09 DIAGNOSIS — Z89411 Acquired absence of right great toe: Secondary | ICD-10-CM | POA: Diagnosis not present

## 2018-11-09 DIAGNOSIS — E1165 Type 2 diabetes mellitus with hyperglycemia: Secondary | ICD-10-CM | POA: Diagnosis not present

## 2018-11-09 DIAGNOSIS — L97514 Non-pressure chronic ulcer of other part of right foot with necrosis of bone: Secondary | ICD-10-CM | POA: Diagnosis not present

## 2018-11-18 ENCOUNTER — Other Ambulatory Visit: Payer: Self-pay | Admitting: Physician Assistant

## 2018-11-20 ENCOUNTER — Other Ambulatory Visit: Payer: Medicaid Other

## 2018-11-20 DIAGNOSIS — M869 Osteomyelitis, unspecified: Secondary | ICD-10-CM | POA: Diagnosis not present

## 2018-11-20 DIAGNOSIS — E1152 Type 2 diabetes mellitus with diabetic peripheral angiopathy with gangrene: Secondary | ICD-10-CM | POA: Diagnosis not present

## 2018-11-20 DIAGNOSIS — L03115 Cellulitis of right lower limb: Secondary | ICD-10-CM | POA: Diagnosis not present

## 2018-11-20 DIAGNOSIS — E1169 Type 2 diabetes mellitus with other specified complication: Secondary | ICD-10-CM | POA: Diagnosis not present

## 2018-11-20 DIAGNOSIS — L97519 Non-pressure chronic ulcer of other part of right foot with unspecified severity: Secondary | ICD-10-CM | POA: Diagnosis not present

## 2018-11-20 DIAGNOSIS — Z89421 Acquired absence of other right toe(s): Secondary | ICD-10-CM | POA: Diagnosis not present

## 2018-11-20 DIAGNOSIS — E11621 Type 2 diabetes mellitus with foot ulcer: Secondary | ICD-10-CM | POA: Diagnosis not present

## 2018-11-23 DIAGNOSIS — T8743 Infection of amputation stump, right lower extremity: Secondary | ICD-10-CM | POA: Diagnosis not present

## 2018-11-23 DIAGNOSIS — Z89421 Acquired absence of other right toe(s): Secondary | ICD-10-CM | POA: Diagnosis not present

## 2018-11-24 ENCOUNTER — Ambulatory Visit: Payer: Medicaid Other | Admitting: Cardiovascular Disease

## 2018-11-25 ENCOUNTER — Other Ambulatory Visit: Payer: Self-pay | Admitting: Family Medicine

## 2018-11-25 DIAGNOSIS — E1142 Type 2 diabetes mellitus with diabetic polyneuropathy: Secondary | ICD-10-CM

## 2018-11-27 ENCOUNTER — Other Ambulatory Visit: Payer: Medicaid Other

## 2018-11-27 DIAGNOSIS — E113213 Type 2 diabetes mellitus with mild nonproliferative diabetic retinopathy with macular edema, bilateral: Secondary | ICD-10-CM | POA: Diagnosis not present

## 2018-12-01 ENCOUNTER — Other Ambulatory Visit: Payer: Self-pay

## 2018-12-01 ENCOUNTER — Other Ambulatory Visit: Payer: Medicaid Other

## 2018-12-01 DIAGNOSIS — E1142 Type 2 diabetes mellitus with diabetic polyneuropathy: Secondary | ICD-10-CM

## 2018-12-02 ENCOUNTER — Ambulatory Visit (INDEPENDENT_AMBULATORY_CARE_PROVIDER_SITE_OTHER): Payer: Medicaid Other | Admitting: Family Medicine

## 2018-12-02 ENCOUNTER — Ambulatory Visit: Payer: Medicaid Other | Admitting: Family Medicine

## 2018-12-02 ENCOUNTER — Other Ambulatory Visit: Payer: Self-pay | Admitting: Family Medicine

## 2018-12-02 ENCOUNTER — Encounter: Payer: Self-pay | Admitting: Family Medicine

## 2018-12-02 DIAGNOSIS — I5022 Chronic systolic (congestive) heart failure: Secondary | ICD-10-CM

## 2018-12-02 DIAGNOSIS — E1142 Type 2 diabetes mellitus with diabetic polyneuropathy: Secondary | ICD-10-CM

## 2018-12-02 DIAGNOSIS — M86671 Other chronic osteomyelitis, right ankle and foot: Secondary | ICD-10-CM | POA: Insufficient documentation

## 2018-12-02 DIAGNOSIS — Z125 Encounter for screening for malignant neoplasm of prostate: Secondary | ICD-10-CM

## 2018-12-02 DIAGNOSIS — Z89411 Acquired absence of right great toe: Secondary | ICD-10-CM | POA: Diagnosis not present

## 2018-12-02 DIAGNOSIS — N183 Chronic kidney disease, stage 3 unspecified: Secondary | ICD-10-CM

## 2018-12-02 DIAGNOSIS — Z0000 Encounter for general adult medical examination without abnormal findings: Secondary | ICD-10-CM

## 2018-12-02 DIAGNOSIS — E1169 Type 2 diabetes mellitus with other specified complication: Secondary | ICD-10-CM

## 2018-12-02 LAB — HEMOGLOBIN A1C
Hgb A1c MFr Bld: 8 % of total Hgb — ABNORMAL HIGH (ref <5.7)
Mean Plasma Glucose: 183 (calc)
eAG (mmol/L): 10.1 (calc)

## 2018-12-02 NOTE — Patient Instructions (Addendum)
No Access to myChart. AVS info given by phone.

## 2018-12-02 NOTE — Assessment & Plan Note (Addendum)
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  Continue Lantus 40u daily, Victoza 1.2 u daily 2. Encourage improved lifestyle - low carb, low sugar diet, reduce portion size, continue improving regular exercise 3. Check CBG, bring log to next visit for review 4. Continue ASA, ARB, Statin

## 2018-12-02 NOTE — Assessment & Plan Note (Signed)
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

## 2018-12-02 NOTE — Progress Notes (Signed)
Virtual Visit via Telephone The purpose of this virtual visit is to provide medical care while limiting exposure to the novel coronavirus (COVID19) for both patient and office staff.  Consent was obtained for phone visit:  Yes.   Answered questions that patient had about telehealth interaction:  Yes.   I discussed the limitations, risks, security and privacy concerns of performing an evaluation and management service by telephone. I also discussed with the patient that there may be a patient responsible charge related to this service. The patient expressed understanding and agreed to proceed.  Patient Location: Home Provider Location: [facility])  ---------------------------------------------------------------------- Chief Complaint  Patient presents with  . Diabetes    S: Reviewed CMA documentation. I have called patient and gathered additional HPI as follows:  Specialists Cardiology - Dr Fletcher Anon [facility] Cardiology) Nephrology - Dr Holley Raring ([facility])  CHRONIC DM, Type 2 with DM Neuropathy/ CKD-III - Last visit with me 08/2018, for same problem diabetes, treated with continued victoza and lantus, see prior notes for background information. - Interval update with now adherence to meds, and has approval on victoza - Today patient reports doing well with improved sugar control CBG: Avg120, Low>100, High< 250. Checks fasting AM most days Meds: - Victoza 1.2 mg inj daily - Lantus 40udaily - OFFMetformin due to CKD. Failed Bydureon in past due to nausea Reports good compliance. Tolerating well w/o side-effects Currently on ARB Lifestyle: - Diet (Still slowly improving DM diet) - Exercise (Limited regular exericse) UTD DM Eye Exam 07/2018 -Admits intermittent burning, tingling at times in feet, worse at night Denies hypoglycemia, polyuria, visual changes.  Right Foot/Toe recurrent soft tissue infection vs chronic osteomyelitis S/p R Great Toe Amputation  08/2018 Followed by [facility] ID - had been managed with I&D after amputation, and prolonged antibiotic oral course Doxycycline/Augmentin. - Now he has finished antibiotic course, and says he seems to be improving. Had blood work improved, and they will monitor monthly with X-ray foot and labs ESR/CRP  CHRONIC HTN: No new concerns. Last BP check was normal. Current Meds -Losartan 171m daily , Hydralazine 1017mTID, Carvedilol 2544mID, Imdur 17m73mily Reports good compliance, took meds today. Tolerating well, w/o complaints. Denies CP, dyspnea, HA, edema, dizziness / lightheadedness  Denies any high risk travel to areas of current concern for COVID19. Denies any known or suspected exposure to person with or possibly with COVID19.  Denies any fevers, chills, sweats, body ache, cough, shortness of breath, sinus pain or pressure, headache, abdominal pain, diarrhea  Past Medical History:  Diagnosis Date  . Ankle pain   . Chronic combined systolic (congestive) and diastolic (congestive) heart failure (HCC)[place]/2018   a. 10/2016 Echo: EF 25-30%, diff HK, mild MR; b. 01/2017 Echo:  EF 40-45%, moderate LVH, Gr1DD, calcified mitral annulus, mildly dilated left atrium, RV cavity size was normal with normal RVSF; c. 08/2017 Echo: EF 40-45%, sept, antsept HK, Gr1DD, Mild MR, mildly dil LA. Nl RV fxn.  . CKD (chronic kidney disease), stage III (HCC)[place]. Coronary artery disease    a. 10/2016 Cath/PCI: LAD 17m [id number] Xience Alpine DES). No other obstructive disease.  . Diabetic neuropathy (HCC) [place] Hyperlipidemia   . Hypertension   . 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 10/2016 Echo: EF 25-30%, diff HK; b. 01/2017 Echo: EF 40-45%; c. 08/2017 Echo: EF 40-45%.  . Pain in both feet    Social History   Tobacco Use  .  Smoking status: Passive Smoke Exposure - Never Smoker  . Smokeless tobacco: Never Used  Substance Use Topics  . Alcohol use: Yes  . Drug use: No    Current  Outpatient Medications:  .  ACCU-CHEK GUIDE test strip, Check blood sugar up to 3 times daily as advised, Disp: 100 each, Rfl: 12 .  acetaminophen (TYLENOL 8 HOUR) 650 MG CR tablet, Take 1 tablet (650 mg total) by mouth every 8 (eight) hours as needed for pain., Disp: , Rfl:  .  Alpha-Lipoic Acid 600 MG CAPS, Take 1 capsule by mouth daily., Disp: , Rfl:  .  aspirin EC 81 MG tablet, Take 81 mg by mouth., Disp: , Rfl:  .  atorvastatin (LIPITOR) 40 MG tablet, Take 1 tablet (40 mg total) by mouth at bedtime., Disp: 90 tablet, Rfl: 3 .  blood glucose meter kit and supplies KIT, Dispense brand based on patient and insurance preference. Use up to 3 times daily to check blood sugar. DX: ICD 10 E11.9 Diabetes Type 2., Disp: 1 each, Rfl: 0 .  Blood Glucose Monitoring Suppl (ACCU-CHEK GUIDE) w/Device KIT, 1 kit by Does not apply route daily as needed. Check blood sugar up to 3 times daily as advised, Disp: 1 kit, Rfl: 0 .  Blood Pressure Monitoring (BLOOD PRESSURE CUFF) MISC, 1 each by Does not apply route daily., Disp: 1 each, Rfl: 0 .  carvedilol (COREG) 25 MG tablet, Take 1 tablet (25 mg total) by mouth 2 (two) times daily with a meal., Disp: 180 tablet, Rfl: 3 .  cholecalciferol (VITAMIN D) 1000 units tablet, Take 1,000 Units by mouth daily., Disp: , Rfl:  .  clopidogrel (PLAVIX) 75 MG tablet, Take 1 tablet (75 mg total) by mouth daily., Disp: 30 tablet, Rfl: 10 .  furosemide (LASIX) 20 MG tablet, Take 0.5 tablets (10 mg total) by mouth daily., Disp: 45 tablet, Rfl: 3 .  gabapentin (NEURONTIN) 400 MG capsule, TAKE 2-3 CAPSULES BY MOUTH 3 (THREE) TIMES DAILY., Disp: 270 capsule, Rfl: 3 .  hydrALAZINE (APRESOLINE) 100 MG tablet, TAKE 1 TABLET (100 MG TOTAL) BY MOUTH 3 (THREE) TIMES DAILY., Disp: 270 tablet, Rfl: 3 .  Insulin Pen Needle (B-D ULTRAFINE III SHORT PEN) 31G X 8 MM MISC, Use to inject insulin and victoza, 2 needles per day, Disp: 200 each, Rfl: 3 .  isosorbide mononitrate (IMDUR) 60 MG 24 hr  tablet, TAKE 1.5 TABLETS (90 MG TOTAL) BY MOUTH DAILY., Disp: 135 tablet, Rfl: 0 .  Lancets Misc. MISC, Use  Brand compatable to insurance and monitor to check blood sugar up to 3 times daily. ICD10 E11.9, Disp: 100 each, Rfl: 12 .  LANTUS SOLOSTAR 100 UNIT/ML Solostar Pen, INJECT 40 UNITS INTO THE SKIN AT BEDTIME., Disp: 15 mL, Rfl: 5 .  liraglutide (VICTOZA) 18 MG/3ML SOPN, Inject 0.2 mLs (1.2 mg total) into the skin daily. Start with 1.51m daily for 4 weeks then increase to 1.8, Disp: 1 pen, Rfl: 5 .  losartan (COZAAR) 100 MG tablet, Take 1 tablet (100 mg total) by mouth daily., Disp: 90 tablet, Rfl: 3 .  traMADol (ULTRAM) 50 MG tablet, Take 1 tablet (50 mg total) by mouth every 6 (six) hours as needed., Disp: 20 tablet, Rfl: 0 .  ACCU-CHEK FASTCLIX LANCETS MISC, Check sugar up to 3 x daily as instructed, Disp: 102 each, Rfl: 12  Dep[facility] 12/02/2018 08/31/2018 02/23/2018  Decreased Interest 0 0 0  Down, Depressed, Hopeless 0 0 0  PHQ - 2  Score 0 0 0    No flowsheet data found.  -------------------------------------------------------------------------- O: No physical exam performed due to remote telephone encounter.  Lab results reviewed.  Recent Labs    05/28/18 0831 08/31/18 0820 12/01/18 0836  HGBA1C 11.9* 10.6* 8.0*     Recent Results (from the past 2160 hour(s))  Hemoglobin A1c     Status: Abnormal   Collection Time: 12/01/18  8:36 AM  Result Value Ref Range   Hgb A1c MFr Bld 8.0 (H) <5.7 % of total Hgb    Comment: For someone without known diabetes, a hemoglobin A1c value of 6.5% or greater indicates that they may have  diabetes and this should be confirmed with a follow-up  test. . For someone with known diabetes, a value <7% indicates  that their diabetes is well controlled and a value  greater than or equal to 7% indicates suboptimal  control. A1c targets should be individualized based on  duration of diabetes, age, comorbid conditions, and  other  considerations. . Currently, no consensus exists regarding use of hemoglobin A1c for diagnosis of diabetes for children. .    Mean Plasma Glucose 183 (calc)   eAG (mmol/L) 10.1 (calc)    -------------------------------------------------------------------------- A&P:  Problem List Items Addressed This Visit    Chronic osteomyelitis involving ankle and foot, right (HCC)    R Great toe S/p amputation 08/2018 Followed by [facility] ID S/p I&D as well post op and oral antibiotic course prolonged doxy/augmentin      DM type 2 with diabetic peripheral neuropathy ([place]) - Primary    Significantly improved A1c down to 8.0 from prior 10-11 - improved med adherence and lifestyle, also treated R great toe amputation from chronic osteo infection No hypoglycemia Complications - Hyperglycemia, CKD-III, peripheral neuropathy, DM retinopathy, DM ulceration Meds - contraindicated Metformin with CKD / Failed Bydureon BCise x 2 trials (nausea)  Plan:  1.  Continue Lantus 40u daily, Victoza 1.2 u daily 2. Encourage improved lifestyle - low carb, low sugar diet, reduce portion size, continue improving regular exercise 3. Check CBG, bring log to next visit for review 4. Continue ASA, ARB, Statin      Right great toe amputee ([place])      No orders of the defined types were placed in this encounter.   Follow-up: - Return in 4 months for Annual Physical - Future labs ordered for 03/2019  Patient verbalizes understanding with the above medical recommendations including the limitation of remote medical advice.  Specific follow-up and call-back criteria were given for patient to follow-up or seek medical care more urgently if needed.   - Time spent in direct consultation with patient on phone: 11 minutes   [name], [facility] 12/02/2018, 3:27 PM

## 2018-12-03 ENCOUNTER — Other Ambulatory Visit: Payer: Self-pay | Admitting: Family Medicine

## 2018-12-03 DIAGNOSIS — E1142 Type 2 diabetes mellitus with diabetic polyneuropathy: Secondary | ICD-10-CM

## 2018-12-03 DIAGNOSIS — I10 Essential (primary) hypertension: Secondary | ICD-10-CM

## 2018-12-09 ENCOUNTER — Telehealth: Payer: Self-pay

## 2018-12-09 ENCOUNTER — Other Ambulatory Visit: Payer: Self-pay | Admitting: Family Medicine

## 2018-12-09 DIAGNOSIS — I10 Essential (primary) hypertension: Secondary | ICD-10-CM

## 2018-12-09 DIAGNOSIS — E1142 Type 2 diabetes mellitus with diabetic polyneuropathy: Secondary | ICD-10-CM

## 2018-12-09 MED ORDER — TELMISARTAN 40 MG PO TABS: 40.0000 mg | ORAL_TABLET | Freq: Every day | ORAL | 1 refills | Status: DC

## 2018-12-09 NOTE — Telephone Encounter (Signed)
Virtual Visit Pre-Appointment Phone Call  "[name], I am calling you today to discuss your upcoming appointment. We are currently trying to limit exposure to the virus that causes COVID-19 by seeing patients at home rather than in the office."  1. "What is the BEST phone number to call the day of the visit?" - include this in appointment notes  2. "Do you have or have access to (through a family member/friend) a smartphone with video capability that we can use for your visit?" a. If yes - list this number in appt notes as "cell" (if different from BEST phone #) and list the appointment type as a VIDEO visit in appointment notes b. If no - list the appointment type as a PHONE visit in appointment notes  3. Confirm consent - "In the setting of the current Covid19 crisis, you are scheduled for a video visit with your provider on Dec 11, 2018 at 8:20AM.  Just as we do with many in-office visits, in order for you to participate in this visit, we must obtain consent.  If you'd like, I can send this to your mychart (if signed up) or email for you to review.  Otherwise, I can obtain your verbal consent now.  All virtual visits are billed to your insurance company just like a normal visit would be.  By agreeing to a virtual visit, we'd like you to understand that the technology does not allow for your provider to perform an examination, and thus may limit your provider's ability to fully assess your condition. If your provider identifies any concerns that need to be evaluated in person, we will make arrangements to do so.  Finally, though the technology is pretty good, we cannot assure that it will always work on either your or our end, and in the setting of a video visit, we may have to convert it to a phone-only visit.  In either situation, we cannot ensure that we have a secure connection.  Are you willing to proceed?" STAFF: Did the patient verbally acknowledge consent to telehealth visit? Document YES/NO  here: YES PER WIFE DONNA (LISED ON DPR)  4. Advise patient to be prepared - "Two hours prior to your appointment, go ahead and check your blood pressure, pulse, oxygen saturation, and your weight (if you have the equipment to check those) and write them all down. When your visit starts, your provider will ask you for this information. If you have an Apple Watch or Kardia device, please plan to have heart rate information ready on the day of your appointment. Please have a pen and paper handy nearby the day of the visit as well."  5. Give patient instructions for MyChart download to smartphone OR [email or web address] as below if video visit (depending on what platform provider is using)  6. Inform patient they will receive a phone call 15 minutes prior to their appointment time (may be from unknown caller ID) so they should be prepared to answer    TELEPHONE CALL NOTE  TEYON ODETTE has been deemed a candidate for a follow-up tele-health visit to limit community exposure during the Covid-19 pandemic. I spoke with the patient via phone to ensure availability of phone/video source, confirm preferred email & phone number, and discuss instructions and expectations.  I reminded [name] to be prepared with any vital sign and/or heart rhythm information that could potentially be obtained via home monitoring, at the time of his visit. I reminded [name] to  expect a phone call prior to his visit.  [name] 12/09/2018 12:22 PM   INSTRUCTIONS FOR DOWNLOADING THE MYCHART APP TO SMARTPHONE  - The patient must first make sure to have activated MyChart and know their login information - If Apple, go to [employer] and type in MyChart in the search bar and download the app. If Android, ask patient to go to [employer] and type in [place] in the search bar and download the app. The app is free but as with any other app downloads, their phone may require them to verify  saved payment information or Apple/Android password.  - The patient will need to then log into the app with their MyChart username and password, and select [facility] as their healthcare provider to link the account. When it is time for your visit, go to the MyChart app, find appointments, and click Begin Video Visit. Be sure to Select Allow for your device to access the Microphone and Camera for your visit. You will then be connected, and your provider will be with you shortly.  **If they have any issues connecting, or need assistance please contact MyChart service desk [phone])**  **If using a computer, in order to ensure the best quality for their visit they will need to use either of the following Internet Browsers: [employer], or Google Chrome**  IF USING DOXIMITY or DOXY.ME - The patient will receive a link just prior to their visit by text.     FULL LENGTH CONSENT FOR TELE-HEALTH VISIT   I hereby voluntarily request, consent and authorize [facility] and its employed or contracted physicians, physician assistants, nurse practitioners or other licensed health care professionals (the Practitioner), to provide me with telemedicine health care services (the "Services") as deemed necessary by the treating Practitioner. I acknowledge and consent to receive the Services by the Practitioner via telemedicine. I understand that the telemedicine visit will involve communicating with the Practitioner through live audiovisual communication technology and the disclosure of certain medical information by electronic transmission. I acknowledge that I have been given the opportunity to request an in-person assessment or other available alternative prior to the telemedicine visit and am voluntarily participating in the telemedicine visit.  I understand that I have the right to withhold or withdraw my consent to the use of telemedicine in the course of my care at any time, without  affecting my right to future care or treatment, and that the Practitioner or I may terminate the telemedicine visit at any time. I understand that I have the right to inspect all information obtained and/or recorded in the course of the telemedicine visit and may receive copies of available information for a reasonable fee.  I understand that some of the potential risks of receiving the Services via telemedicine include:  [name] Delay or interruption in medical evaluation due to technological equipment failure or disruption; . Information transmitted may not be sufficient (e.g. poor resolution of images) to allow for appropriate medical decision making by the Practitioner; and/or  . In rare instances, security protocols could fail, causing a breach of personal health information.  Furthermore, I acknowledge that it is my responsibility to provide information about my medical history, conditions and care that is complete and accurate to the best of my ability. I acknowledge that Practitioner's advice, recommendations, and/or decision may be based on factors not within their control, such as incomplete or inaccurate data provided by me or distortions of diagnostic images or specimens  that may result from electronic transmissions. I understand that the practice of medicine is not an exact science and that Practitioner makes no warranties or guarantees regarding treatment outcomes. I acknowledge that I will receive a copy of this consent concurrently upon execution via email to the email address I last provided but may also request a printed copy by calling the office of [facility].    I understand that my insurance will be billed for this visit.   I have read or had this consent read to me. . I understand the contents of this consent, which adequately explains the benefits and risks of the Services being provided via telemedicine.  . I have been provided ample opportunity to ask questions regarding this  consent and the Services and have had my questions answered to my satisfaction. . I give my informed consent for the services to be provided through the use of telemedicine in my medical care  By participating in this telemedicine visit I agree to the above.

## 2018-12-09 NOTE — Telephone Encounter (Signed)
Call attempted to switch 12/11/2018 face to face visit with Dr. Fletcher Anon to a telephone or video visit due to current clinic policies related to COVID 19 precautions. Unable to leave message-voice mailbox not set up yet.

## 2018-12-11 ENCOUNTER — Telehealth (INDEPENDENT_AMBULATORY_CARE_PROVIDER_SITE_OTHER): Payer: Medicaid Other | Admitting: Cardiovascular Disease

## 2018-12-11 ENCOUNTER — Other Ambulatory Visit: Payer: Self-pay

## 2018-12-11 ENCOUNTER — Other Ambulatory Visit: Payer: Self-pay | Admitting: Cardiovascular Disease

## 2018-12-11 ENCOUNTER — Encounter: Payer: Self-pay | Admitting: Cardiovascular Disease

## 2018-12-11 VITALS — Ht 68.0 in | Wt 200.0 lb

## 2018-12-11 DIAGNOSIS — I5022 Chronic systolic (congestive) heart failure: Secondary | ICD-10-CM | POA: Diagnosis not present

## 2018-12-11 DIAGNOSIS — I2510 Atherosclerotic heart disease of native coronary artery without angina pectoris: Secondary | ICD-10-CM

## 2018-12-11 DIAGNOSIS — E785 Hyperlipidemia, unspecified: Secondary | ICD-10-CM

## 2018-12-11 DIAGNOSIS — E1169 Type 2 diabetes mellitus with other specified complication: Secondary | ICD-10-CM

## 2018-12-11 MED ORDER — ATORVASTATIN CALCIUM 40 MG PO TABS: 40.0000 mg | ORAL_TABLET | Freq: Every day | ORAL | 3 refills | Status: DC

## 2018-12-11 MED ORDER — LOSARTAN POTASSIUM 100 MG PO TABS: 100.0000 mg | ORAL_TABLET | Freq: Every day | ORAL | 3 refills | Status: DC

## 2018-12-11 MED ORDER — HYDRALAZINE HCL 100 MG PO TABS: 100.0000 mg | ORAL_TABLET | Freq: Three times a day (TID) | ORAL | 3 refills | Status: DC

## 2018-12-11 MED ORDER — FUROSEMIDE 20 MG PO TABS: 10.0000 mg | ORAL_TABLET | Freq: Every day | ORAL | 3 refills | Status: DC

## 2018-12-11 NOTE — Progress Notes (Signed)
Virtual Visit via Telephone Note   This visit type was conducted due to national recommendations for restrictions regarding the COVID-19 Pandemic (e.g. social distancing) in an effort to limit this patient's exposure and mitigate transmission in our community.  Due to his co-morbid illnesses, this patient is at least at moderate risk for complications without adequate follow up.  This format is felt to be most appropriate for this patient at this time.  The patient did not have access to video technology/had technical difficulties with video requiring transitioning to audio format only (telephone).  All issues noted in this document were discussed and addressed.  No physical exam could be performed with this format.  Please refer to the patient's chart for his  consent to telehealth for [facility].   Date:  12/11/2018   ID:  [name], DOB 12/28/1960, MRN [id number]  Patient Location: Home Provider Location: Office  PCP:  Olin Hauser, DO  Cardiologist:  [name], MD  Electrophysiologist:  None   Evaluation Performed:  Follow-Up Visit  Chief Complaint: No complaints today.  History of Present Illness:    [name] is a 58 y.o. male who was reached via phone for a follow-up visit.  This was supposed to be a video visit but the patient could not connect to [place]. He has past medical history of CAD, mixed ischemic and nonischemic cardiomyopathy, diabetes, hypertension, hyperlipidemia, and stage III chronic kidney disease.  He is status post drug-eluting stent placement to the LAD in April 2018.  At that time, he was found to have severe LV dysfunction with an EF of 25 to 30%.  Diffuse hypokinesis were noted and felt to be out of proportion to the LAD disease.  He also has a history of alcohol abuse.  A follow-up echocardiogram in July 2018 showed improved LV function with an EF of 40 to 45%.    Most recent echo in February 2019  showed stable LV  dysfunction with an EF of 40 to 45% and grade 1 diastolic dysfunction.  Amlodipine was discontinued due to leg edema.    He has been doing reasonably well with no recent chest pain, shortness of breath or palpitations.  He reports no significant leg edema.  The patient does not have symptoms concerning for COVID-19 infection (fever, chills, cough, or new shortness of breath).    Past Medical History:  Diagnosis Date  . Ankle pain   . Chronic combined systolic (congestive) and diastolic (congestive) heart failure ([place]) 10/2016   a. 10/2016 Echo: EF 25-30%, diff HK, mild MR; b. 01/2017 Echo:  EF 40-45%, moderate LVH, Gr1DD, calcified mitral annulus, mildly dilated left atrium, RV cavity size was normal with normal RVSF; c. 08/2017 Echo: EF 40-45%, sept, antsept HK, Gr1DD, Mild MR, mildly dil LA. Nl RV fxn.  . CKD (chronic kidney disease), stage III ([place])   . Coronary artery disease    a. 10/2016 Cath/PCI: LAD 78m(3.5x15 Xience Alpine DES). No other obstructive disease.  . Diabetic neuropathy ([place]   . Hyperlipidemia   . Hypertension   . 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 10/2016 Echo: EF 25-30%, diff HK; b. 01/2017 Echo: EF 40-45%; c. 08/2017 Echo: EF 40-45%.  . Pain in both feet    Past Surgical History:  Procedure Laterality Date  . CORONARY STENT INTERVENTION N/A 10/21/2016   Procedure: Coronary Stent Intervention;  Surgeon: MWellington Hampshire MD;  [facility];  Service: Cardiovascular;  Laterality: N/A;  . LEFT HEART CATH AND CORONARY ANGIOGRAPHY N/A 10/21/2016   Procedure: Left Heart Cath and Coronary Angiography;  Surgeon: Wellington Hampshire, MD;  Location: [place] [facility];  Service: Cardiovascular;  Laterality: N/A;  . TIBIA FRACTURE SURGERY Right 2002     Current Meds  Medication Sig  . ACCU-CHEK FASTCLIX LANCETS MISC Check sugar up to 3 x daily as instructed  . ACCU-CHEK GUIDE test strip Check blood sugar up to 3 times daily as advised   . acetaminophen (TYLENOL 8 HOUR) 650 MG CR tablet Take 1 tablet (650 mg total) by mouth every 8 (eight) hours as needed for pain.  . Alpha-Lipoic Acid 600 MG CAPS Take 1 capsule by mouth daily.  [name] aspirin EC 81 MG tablet Take 81 mg by mouth.  [name] atorvastatin (LIPITOR) 40 MG tablet Take 1 tablet (40 mg total) by mouth at bedtime.  . blood glucose meter kit and supplies KIT Dispense brand based on patient and insurance preference. Use up to 3 times daily to check blood sugar. DX: ICD 10 E11.9 Diabetes Type 2.  . Blood Glucose Monitoring Suppl (ACCU-CHEK GUIDE) w/Device KIT 1 kit by Does not apply route daily as needed. Check blood sugar up to 3 times daily as advised  . carvedilol (COREG) 25 MG tablet Take 1 tablet (25 mg total) by mouth 2 (two) times daily with a meal.  . cholecalciferol (VITAMIN D) 1000 units tablet Take 1,000 Units by mouth daily.  . clopidogrel (PLAVIX) 75 MG tablet Take 1 tablet (75 mg total) by mouth daily.  . furosemide (LASIX) 20 MG tablet Take 0.5 tablets (10 mg total) by mouth daily.  [name] gabapentin (NEURONTIN) 400 MG capsule TAKE 2-3 CAPSULES BY MOUTH 3 (THREE) TIMES DAILY.  . hydrALAZINE (APRESOLINE) 100 MG tablet TAKE 1 TABLET (100 MG TOTAL) BY MOUTH 3 (THREE) TIMES DAILY.  [name] Insulin Pen Needle (B-D ULTRAFINE III SHORT PEN) 31G X 8 MM MISC Use to inject insulin and victoza, 2 needles per day  . isosorbide mononitrate (IMDUR) 60 MG 24 hr tablet TAKE 1.5 TABLETS (90 MG TOTAL) BY MOUTH DAILY.  [name] Lancets Misc. MISC Use  Brand compatable to insurance and monitor to check blood sugar up to 3 times daily. ICD10 E11.9  . LANTUS SOLOSTAR 100 UNIT/ML Solostar Pen INJECT 40 UNITS INTO THE SKIN AT BEDTIME.  [name] liraglutide (VICTOZA) 18 MG/3ML SOPN Inject 0.2 mLs (1.2 mg total) into the skin daily. Start with 1.84m daily for 4 weeks then increase to 1.8  . losartan (COZAAR) 100 MG tablet Take 100 mg by mouth daily.     Allergies:   Lisinopril and Terbinafine and related   Social  History   Tobacco Use  . Smoking status: Passive Smoke Exposure - Never Smoker  . Smokeless tobacco: Never Used  Substance Use Topics  . Alcohol use: Yes  . Drug use: No     Family Hx: The patient's family history includes Heart disease in his maternal grandfather; Hypertension in his mother.  ROS:   Please see the history of present illness.     All other systems reviewed and are negative.   Prior CV studies:   The following studies were reviewed today:    Labs/Other Tests and Data Reviewed:    EKG:  No ECG reviewed.  Recent Labs: 07/16/2018: ALT 16; BUN 38; Creatinine, Ser 1.87; Hemoglobin 12.0; Platelets 227; Potassium 4.4; Sodium 127   Recent Lipid Panel Lab Results  Component Value Date/Time   CHOL 126 03/17/2018 04:01 PM   TRIG 308 (H) 03/17/2018 04:01 PM   HDL 35 (L) 03/17/2018 04:01 PM   CHOLHDL 3.6 03/17/2018 04:01 PM   CHOLHDL 3.4 10/20/2016 05:15 AM   LDLCALC 29 03/17/2018 04:01 PM   LDLDIRECT 52 03/17/2018 04:01 PM    Wt Readings from Last 3 Encounters:  12/11/18 200 lb (90.7 kg)  08/31/18 199 lb 12.8 oz (90.6 kg)  07/16/18 210 lb (95.3 kg)     Objective:    Vital Signs:  Ht 5' 8"  (1.727 m)   Wt 200 lb (90.7 kg)   BMI 30.41 kg/m    VITAL SIGNS:  reviewed  ASSESSMENT & PLAN:    1.  Coronary artery disease: Status post LAD stenting in April 2018.  He has done well since then without any recurrent chest pain or dyspnea.    It has been more than 2 years since his drug-eluting stent placement.  Thus, I instructed him to stop taking clopidogrel and continue aspirin indefinitely.  2.  Ischemic cardiomyopathy/HFrEF: EF improved to 40 to 45% by echo in July 2019 and noted again in February 2019.  Leg edema improved after stopping amlodipine.    He remains on beta-blocker, ARB, hydralazine, nitrate, and Lasix.    3.  Essential hypertension: Stable on beta-blocker, ARB, hydralazine, nitrate, and diuretic therapy.  4.  Hyperlipidemia: Most recent  lipid profile showed an LDL of 52.  Continue atorvastatin.  Elevated triglyceride is likely due to uncontrolled diabetes.  5.  Type 2 diabetes mellitus: Hemoglobin A1c is gradually improving but still above target.  6.  Stage III chronic kidney disease: Stable   COVID-19 Education: The signs and symptoms of COVID-19 were discussed with the patient and how to seek care for testing (follow up with PCP or arrange E-visit).  The importance of social distancing was discussed today.  Time:   Today, I have spent 8 minutes with the patient with telehealth technology discussing the above problems.     Medication Adjustments/Labs and Tests Ordered: Current medicines are reviewed at length with the patient today.  Concerns regarding medicines are outlined above.   Tests Ordered: No orders of the defined types were placed in this encounter.   Medication Changes: No orders of the defined types were placed in this encounter.   Disposition:  Follow up in 4 month(s)  Signed, [name], MD  12/11/2018 8:57 AM    [facility]

## 2018-12-11 NOTE — Patient Instructions (Signed)
Medication Instructions:  Stop taking clopidogrel (Plavix) Continue other medications. If you need a refill on your cardiac medications before your next appointment, please call your pharmacy.   Lab work: None If you have labs (blood work) drawn today and your tests are completely normal, you will receive your results only by: [name] MyChart Message (if you have MyChart) OR . A paper copy in the mail If you have any lab test that is abnormal or we need to change your treatment, we will call you to review the results.  Testing/Procedures: None  Follow-Up: At [facility], you and your health needs are our priority.  As part of our continuing mission to provide you with exceptional heart care, we have created designated Provider Care Teams.  These Care Teams include your primary Cardiologist (physician) and Advanced Practice Providers (APPs -  Physician Assistants and Nurse Practitioners) who all work together to provide you with the care you need, when you need it. You will need a follow up appointment in 4 months.  Please call our office 2 months in advance to schedule this appointment.  You may see [name], MD or one of the following Advanced Practice Providers on your designated Care Team:   Murray Hodgkins, NP Christell Faith, PA-C . Marrianne Mood, PA-C

## 2018-12-14 ENCOUNTER — Other Ambulatory Visit: Payer: Self-pay | Admitting: Cardiovascular Disease

## 2018-12-14 NOTE — Telephone Encounter (Signed)
Please review per pharmacy medication on backorder.

## 2018-12-15 NOTE — Telephone Encounter (Signed)
Error contacted pharmacy to not fill until authorized by Provider.

## 2018-12-15 NOTE — Telephone Encounter (Signed)
Please advise If ok to refill.  Pt takes Losartan 100 mg tablet qd currently on back order. Losartan isn't on pt current medlist at this time. Please advise.

## 2018-12-18 DIAGNOSIS — E11621 Type 2 diabetes mellitus with foot ulcer: Secondary | ICD-10-CM | POA: Diagnosis not present

## 2018-12-18 DIAGNOSIS — E1152 Type 2 diabetes mellitus with diabetic peripheral angiopathy with gangrene: Secondary | ICD-10-CM | POA: Diagnosis not present

## 2018-12-18 DIAGNOSIS — L89893 Pressure ulcer of other site, stage 3: Secondary | ICD-10-CM | POA: Diagnosis not present

## 2018-12-18 DIAGNOSIS — Z89421 Acquired absence of other right toe(s): Secondary | ICD-10-CM | POA: Diagnosis not present

## 2018-12-18 DIAGNOSIS — L97412 Non-pressure chronic ulcer of right heel and midfoot with fat layer exposed: Secondary | ICD-10-CM | POA: Diagnosis not present

## 2018-12-18 DIAGNOSIS — L97511 Non-pressure chronic ulcer of other part of right foot limited to breakdown of skin: Secondary | ICD-10-CM | POA: Diagnosis not present

## 2019-01-08 ENCOUNTER — Other Ambulatory Visit: Payer: Self-pay | Admitting: Family Medicine

## 2019-01-08 DIAGNOSIS — E1142 Type 2 diabetes mellitus with diabetic polyneuropathy: Secondary | ICD-10-CM

## 2019-01-09 MED ORDER — LANTUS SOLOSTAR 100 UNIT/ML ~~LOC~~ SOPN: 40.0000 [IU] | PEN_INJECTOR | Freq: Every day | SUBCUTANEOUS | 5 refills | Status: DC

## 2019-01-09 NOTE — Addendum Note (Signed)
Addended by: Olin Hauser on: 01/09/2019 11:11 AM   Modules accepted: Orders

## 2019-02-13 ENCOUNTER — Other Ambulatory Visit: Payer: Self-pay | Admitting: Family Medicine

## 2019-02-13 DIAGNOSIS — E1142 Type 2 diabetes mellitus with diabetic polyneuropathy: Secondary | ICD-10-CM

## 2019-03-09 DIAGNOSIS — L97412 Non-pressure chronic ulcer of right heel and midfoot with fat layer exposed: Secondary | ICD-10-CM | POA: Diagnosis not present

## 2019-03-09 DIAGNOSIS — I110 Hypertensive heart disease with heart failure: Secondary | ICD-10-CM | POA: Diagnosis not present

## 2019-03-09 DIAGNOSIS — M21621 Bunionette of right foot: Secondary | ICD-10-CM | POA: Diagnosis not present

## 2019-03-09 DIAGNOSIS — I509 Heart failure, unspecified: Secondary | ICD-10-CM | POA: Diagnosis not present

## 2019-03-09 DIAGNOSIS — E1140 Type 2 diabetes mellitus with diabetic neuropathy, unspecified: Secondary | ICD-10-CM | POA: Diagnosis not present

## 2019-03-09 DIAGNOSIS — E11621 Type 2 diabetes mellitus with foot ulcer: Secondary | ICD-10-CM | POA: Diagnosis not present

## 2019-03-09 DIAGNOSIS — Z955 Presence of coronary angioplasty implant and graft: Secondary | ICD-10-CM | POA: Diagnosis not present

## 2019-03-09 DIAGNOSIS — Z89421 Acquired absence of other right toe(s): Secondary | ICD-10-CM | POA: Diagnosis not present

## 2019-03-09 DIAGNOSIS — E11628 Type 2 diabetes mellitus with other skin complications: Secondary | ICD-10-CM | POA: Diagnosis not present

## 2019-03-09 DIAGNOSIS — L97519 Non-pressure chronic ulcer of other part of right foot with unspecified severity: Secondary | ICD-10-CM | POA: Diagnosis not present

## 2019-03-09 DIAGNOSIS — S91301D Unspecified open wound, right foot, subsequent encounter: Secondary | ICD-10-CM | POA: Diagnosis not present

## 2019-03-09 DIAGNOSIS — I2510 Atherosclerotic heart disease of native coronary artery without angina pectoris: Secondary | ICD-10-CM | POA: Diagnosis not present

## 2019-03-31 ENCOUNTER — Ambulatory Visit
Admission: EM | Admit: 2019-03-31 | Discharge: 2019-03-31 | Disposition: A | Discharge status: Home / Self Care | Payer: Medicaid Other | Attending: Family Medicine | Admitting: Family Medicine

## 2019-03-31 ENCOUNTER — Encounter: Payer: Self-pay | Admitting: Emergency Medicine

## 2019-03-31 ENCOUNTER — Other Ambulatory Visit: Payer: Medicaid Other

## 2019-03-31 ENCOUNTER — Other Ambulatory Visit: Payer: Self-pay

## 2019-03-31 DIAGNOSIS — L03115 Cellulitis of right lower limb: Secondary | ICD-10-CM | POA: Diagnosis not present

## 2019-03-31 MED ORDER — DOXYCYCLINE HYCLATE 100 MG PO TABS: 100.0000 mg | ORAL_TABLET | Freq: Two times a day (BID) | ORAL | 0 refills | Status: DC

## 2019-03-31 MED ORDER — HYDROCODONE-ACETAMINOPHEN 5-325 MG PO TABS: ORAL_TABLET | ORAL | 0 refills | Status: DC

## 2019-03-31 NOTE — Discharge Instructions (Signed)
Elevate foot, warm compresses Follow up as scheduled tomorrow morning with podiatrist

## 2019-03-31 NOTE — ED Provider Notes (Addendum)
[facility]    CSN: [id number] Arrival date & time: 03/31/19  1108      History   Chief Complaint Chief Complaint  Patient presents with  . Foot Swelling  . Foot Pain    HPI [name] is a 58 y.o. male.   58 yo male with a c/o right foot pain, redness and swelling over the past week. Patient is diabetic and has a h/o diabetic foot ulcerations. Denies any fevers or chills. States he has an appointment with his podiatrist tomorrow morning.    Foot Pain    Past Medical History:  Diagnosis Date  . Ankle pain   . Chronic combined systolic (congestive) and diastolic (congestive) heart failure ([place]) 10/2016   a. 10/2016 Echo: EF 25-30%, diff HK, mild MR; b. 01/2017 Echo:  EF 40-45%, moderate LVH, Gr1DD, calcified mitral annulus, mildly dilated left atrium, RV cavity size was normal with normal RVSF; c. 08/2017 Echo: EF 40-45%, sept, antsept HK, Gr1DD, Mild MR, mildly dil LA. Nl RV fxn.  . CKD (chronic kidney disease), stage III ([place])   . Coronary artery disease    a. 10/2016 Cath/PCI: LAD 76m(3.5x15 Xience Alpine DES). No other obstructive disease.  . Diabetic neuropathy ([place]   . Hyperlipidemia   . Hypertension   . 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 10/2016 Echo: EF 25-30%, diff HK; b. 01/2017 Echo: EF 40-45%; c. 08/2017 Echo: EF 40-45%.  . Pain in both feet     Patient Active Problem List   Diagnosis Date Noted  . Right great toe amputee ([place] 12/02/2018  . Chronic osteomyelitis involving ankle and foot, right ([place] 12/02/2018  . 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 Hypokalemia 10/19/2016  . Chronic systolic heart failure ([facility] 10/13/2016  . Hyperlipidemia associated with type 2 diabetes mellitus ([facility] 01/22/2016  . Chronic kidney disease (CKD), stage III (moderate) ([place] 06/15/2015  . DM type 2 with diabetic peripheral  neuropathy ([place] 08/27/2013  . AA (alcohol abuse) 05/18/2012  . Osteoarthritis of ankle and foot 05/18/2012  . Bursitis of elbow 01/14/2012  . Resistant hypertension 09/20/2007    Past Surgical History:  Procedure Laterality Date  . CORONARY STENT INTERVENTION N/A 10/21/2016   Procedure: Coronary Stent Intervention;  Surgeon: MWellington Hampshire MD;  [facility];  Service: Cardiovascular;  Laterality: N/A;  . LEFT HEART CATH AND CORONARY ANGIOGRAPHY N/A 10/21/2016   Procedure: Left Heart Cath and Coronary Angiography;  Surgeon: MWellington Hampshire MD;  [facility];  Service: Cardiovascular;  Laterality: N/A;  . TIBIA FRACTURE SURGERY Right 2002       Home Medications    Prior to Admission medications   Medication Sig Start Date End Date Taking? Authorizing Provider  ACCU-CHEK FASTCLIX LANCETS MISC Check sugar up to 3 x daily as instructed 08/26/18  Yes Karamalegos, ADevonne Doughty DO  ACCU-CHEK GUIDE test strip Check blood sugar up to 3 times daily as advised 08/26/18  Yes Karamalegos, ADevonne Doughty DO  acetaminophen (TYLENOL 8 HOUR) 650 MG CR tablet Take 1 tablet (650 mg total) by mouth every 8 (eight) hours as needed for pain. 08/28/15  Yes Krebs, AGenevie Cheshire NP  aspirin EC 81 MG tablet Take 81 mg by mouth. 09/22/07  Yes [provider]  atorvastatin (LIPITOR) 40 MG tablet Take 1 tablet (40 mg total) by mouth  at bedtime. 12/11/18  Yes Wellington Hampshire, MD  blood glucose meter kit and supplies KIT Dispense brand based on patient and insurance preference. Use up to 3 times daily to check blood sugar. DX: ICD 10 E11.9 Diabetes Type 2. 07/24/18  Yes Karamalegos, Devonne Doughty, DO  Blood Glucose Monitoring Suppl (ACCU-CHEK GUIDE) w/Device KIT 1 kit by Does not apply route daily as needed. Check blood sugar up to 3 times daily as advised 07/27/18  Yes Karamalegos, Devonne Doughty, DO  carvedilol (COREG) 25 MG tablet Take 1 tablet (25 mg total) by mouth 2 (two) times  daily with a meal. 08/31/18  Yes Karamalegos, Devonne Doughty, DO  cholecalciferol (VITAMIN D) 1000 units tablet Take 1,000 Units by mouth daily.   Yes [provider]  furosemide (LASIX) 20 MG tablet Take 0.5 tablets (10 mg total) by mouth daily. 12/11/18  Yes Wellington Hampshire, MD  gabapentin (NEURONTIN) 400 MG capsule TAKE 2-3 CAPSULES BY MOUTH 3 (THREE) TIMES DAILY. 01/09/19  Yes Karamalegos, Devonne Doughty, DO  hydrALAZINE (APRESOLINE) 100 MG tablet Take 1 tablet (100 mg total) by mouth 3 (three) times daily. 12/11/18  Yes Wellington Hampshire, MD  isosorbide mononitrate (IMDUR) 60 MG 24 hr tablet TAKE 1.5 TABLETS (90 MG TOTAL) BY MOUTH DAILY. 11/18/18  Yes Dunn, Ryan M, PA-C  LANTUS SOLOSTAR 100 UNIT/ML Solostar Pen Inject 40 Units into the skin at bedtime. 01/09/19  Yes Karamalegos, Devonne Doughty, DO  telmisartan (MICARDIS) 80 MG tablet Please specify directions, refills and quantity 12/15/18  Yes Arida, Mertie Clause, MD  VICTOZA 18 MG/3ML SOPN INJECT 0.3 MLS INTO THE SKIN DAILY. START WITH 1.2MG DAILY FOR 4 WEEKS THEN INCREASE TO 1.8 02/15/19  Yes Karamalegos, Devonne Doughty, DO  Alpha-Lipoic Acid 600 MG CAPS Take 1 capsule by mouth daily.    [provider]  Blood Pressure Monitoring (BLOOD PRESSURE CUFF) MISC 1 each by Does not apply route daily. Patient not taking: Reported on 12/11/2018 02/12/16   Luciana Axe, NP  doxycycline (VIBRA-TABS) 100 MG tablet Take 1 tablet (100 mg total) by mouth 2 (two) times daily. 03/31/19   Norval Gable, MD  HYDROcodone-acetaminophen (NORCO/VICODIN) 5-325 MG tablet 1-2 tabs po qd prn 03/31/19   Norval Gable, MD  Insulin Pen Needle (B-D ULTRAFINE III SHORT PEN) 31G X 8 MM MISC Use to inject insulin and victoza, 2 needles per day 08/31/18   Olin Hauser, DO  Lancets Misc. MISC Use  Brand compatable to insurance and monitor to check blood sugar up to 3 times daily. ICD10 E11.9 07/24/18   Karamalegos, Devonne Doughty, DO  traMADol (ULTRAM) 50 MG tablet Take 1  tablet (50 mg total) by mouth every 6 (six) hours as needed. Patient not taking: Reported on 12/11/2018 07/16/18 07/16/19  Earleen Newport, MD    Family History Family History  Problem Relation Age of Onset  . Hypertension Mother   . Heart disease Maternal Grandfather     Social History Social History   Tobacco Use  . Smoking status: Passive Smoke Exposure - Never Smoker  . Smokeless tobacco: Never Used  Substance Use Topics  . Alcohol use: Yes  . Drug use: No     Allergies   Lisinopril and Terbinafine and related   Review of Systems Review of Systems   Physical Exam Triage Vital Signs ED Triage Vitals  Enc Vitals Group     BP 03/31/19 1128 (!) 157/83     Pulse Rate 03/31/19 1128 66  Resp 03/31/19 1128 18     Temp 03/31/19 1128 100 F (37.8 C)     Temp Source 03/31/19 1128 Oral     SpO2 03/31/19 1128 98 %     Weight 03/31/19 1124 198 lb (89.8 kg)     Height 03/31/19 1124 _0  (1.727 m)     Head Circumference --      Peak Flow --      Pain Score 03/31/19 1124 10     Pain Loc --      Pain Edu? --      Excl. in [place]? --    No data found.  Updated Vital Signs BP (!) 157/83 (BP Location: Right Arm)   Pulse 66   Temp 100 F (37.8 C) (Oral)   Resp 18   Ht _1  (1.727 m)   Wt 89.8 kg   SpO2 98%   BMI 30.11 kg/m   Visual Acuity Right Eye Distance:   Left Eye Distance:   Bilateral Distance:    Right Eye Near:   Left Eye Near:    Bilateral Near:     Physical Exam Vitals signs and nursing note reviewed.  Constitutional:      General: He is not in acute distress.    Appearance: He is not toxic-appearing or diaphoretic.  Musculoskeletal:     Right foot: Normal capillary refill. Tenderness (tenderness, blanchable erythema and warmth on the skin over the dorsum of medial foot) and swelling present. No bony tenderness, crepitus or laceration.  Neurological:     Mental Status: He is alert.      UC Treatments / Results  Labs (all labs ordered  are listed, but only abnormal results are displayed) Labs Reviewed - No data to display  EKG   Radiology No results found.  Procedures Procedures (including critical care time)  Medications Ordered in UC Medications - No data to display  Initial Impression / Assessment and Plan / UC Course  I have reviewed the triage vital signs and the nursing notes.  Pertinent labs & imaging results that were available during my care of the patient were reviewed by me and considered in my medical decision making (see chart for details).      Final Clinical Impressions(s) / UC Diagnoses   Final diagnoses:  Cellulitis of right foot     Discharge Instructions     Elevate foot, warm compresses Follow up as scheduled tomorrow morning with podiatrist    ED Prescriptions    Medication Sig Dispense Auth. Provider   HYDROcodone-acetaminophen (NORCO/VICODIN) 5-325 MG tablet 1-2 tabs po qd prn 6 tablet Norval Gable, MD   doxycycline (VIBRA-TABS) 100 MG tablet Take 1 tablet (100 mg total) by mouth 2 (two) times daily. 20 tablet Norval Gable, MD      1. diagnosis reviewed with patient and wife 2. rx as per orders above; reviewed possible side effects, interactions, risks and benefits  3. Recommend supportive treatment as above 4. Follow up with podiatrist tomorrow   Controlled Substance Prescriptions [place] Controlled Substance Registry consulted? Not Applicable   Norval Gable, MD 03/31/19 [place], MD 03/31/19 1556

## 2019-03-31 NOTE — ED Triage Notes (Signed)
Patient c/o right foot and ankle swelling that started 1 week ago. He reports pain, redness and warmth to the area. Patient is a diabetic. He has an appointment tomorrow with [facility] and Vascular center Podiatry but was advised to see Urgent Care for possible antibiotics until he can be seen tomorrow.

## 2019-04-01 DIAGNOSIS — M79674 Pain in right toe(s): Secondary | ICD-10-CM | POA: Diagnosis not present

## 2019-04-01 DIAGNOSIS — M7989 Other specified soft tissue disorders: Secondary | ICD-10-CM | POA: Diagnosis not present

## 2019-04-01 DIAGNOSIS — L089 Local infection of the skin and subcutaneous tissue, unspecified: Secondary | ICD-10-CM | POA: Diagnosis not present

## 2019-04-01 DIAGNOSIS — S91301A Unspecified open wound, right foot, initial encounter: Secondary | ICD-10-CM | POA: Diagnosis not present

## 2019-04-01 DIAGNOSIS — M79671 Pain in right foot: Secondary | ICD-10-CM | POA: Diagnosis not present

## 2019-04-01 DIAGNOSIS — E1140 Type 2 diabetes mellitus with diabetic neuropathy, unspecified: Secondary | ICD-10-CM | POA: Diagnosis not present

## 2019-04-01 DIAGNOSIS — I10 Essential (primary) hypertension: Secondary | ICD-10-CM | POA: Diagnosis not present

## 2019-04-01 DIAGNOSIS — R509 Fever, unspecified: Secondary | ICD-10-CM | POA: Diagnosis not present

## 2019-04-01 DIAGNOSIS — Z794 Long term (current) use of insulin: Secondary | ICD-10-CM | POA: Diagnosis not present

## 2019-04-01 DIAGNOSIS — L03115 Cellulitis of right lower limb: Secondary | ICD-10-CM | POA: Diagnosis not present

## 2019-04-01 DIAGNOSIS — R600 Localized edema: Secondary | ICD-10-CM | POA: Diagnosis not present

## 2019-04-01 DIAGNOSIS — Z20828 Contact with and (suspected) exposure to other viral communicable diseases: Secondary | ICD-10-CM | POA: Diagnosis not present

## 2019-04-01 LAB — HEMOGLOBIN A1C: Hemoglobin A1C: 7.7

## 2019-04-02 DIAGNOSIS — L03115 Cellulitis of right lower limb: Secondary | ICD-10-CM | POA: Diagnosis not present

## 2019-04-03 DIAGNOSIS — L03115 Cellulitis of right lower limb: Secondary | ICD-10-CM | POA: Diagnosis not present

## 2019-04-03 DIAGNOSIS — N183 Chronic kidney disease, stage 3 (moderate): Secondary | ICD-10-CM | POA: Diagnosis not present

## 2019-04-03 DIAGNOSIS — I129 Hypertensive chronic kidney disease with stage 1 through stage 4 chronic kidney disease, or unspecified chronic kidney disease: Secondary | ICD-10-CM | POA: Diagnosis not present

## 2019-04-03 DIAGNOSIS — E1122 Type 2 diabetes mellitus with diabetic chronic kidney disease: Secondary | ICD-10-CM | POA: Diagnosis not present

## 2019-04-04 DIAGNOSIS — L03115 Cellulitis of right lower limb: Secondary | ICD-10-CM | POA: Diagnosis not present

## 2019-04-04 DIAGNOSIS — I129 Hypertensive chronic kidney disease with stage 1 through stage 4 chronic kidney disease, or unspecified chronic kidney disease: Secondary | ICD-10-CM | POA: Diagnosis not present

## 2019-04-04 DIAGNOSIS — N183 Chronic kidney disease, stage 3 (moderate): Secondary | ICD-10-CM | POA: Diagnosis not present

## 2019-04-04 DIAGNOSIS — E1122 Type 2 diabetes mellitus with diabetic chronic kidney disease: Secondary | ICD-10-CM | POA: Diagnosis not present

## 2019-04-05 DIAGNOSIS — R633 Feeding difficulties: Secondary | ICD-10-CM | POA: Diagnosis not present

## 2019-04-05 DIAGNOSIS — M8610 Other acute osteomyelitis, unspecified site: Secondary | ICD-10-CM | POA: Diagnosis not present

## 2019-04-05 DIAGNOSIS — M869 Osteomyelitis, unspecified: Secondary | ICD-10-CM | POA: Diagnosis not present

## 2019-04-05 DIAGNOSIS — I129 Hypertensive chronic kidney disease with stage 1 through stage 4 chronic kidney disease, or unspecified chronic kidney disease: Secondary | ICD-10-CM | POA: Diagnosis not present

## 2019-04-05 DIAGNOSIS — E1122 Type 2 diabetes mellitus with diabetic chronic kidney disease: Secondary | ICD-10-CM | POA: Diagnosis not present

## 2019-04-05 DIAGNOSIS — N183 Chronic kidney disease, stage 3 (moderate): Secondary | ICD-10-CM | POA: Diagnosis not present

## 2019-04-05 DIAGNOSIS — L03115 Cellulitis of right lower limb: Secondary | ICD-10-CM | POA: Diagnosis not present

## 2019-04-05 DIAGNOSIS — E118 Type 2 diabetes mellitus with unspecified complications: Secondary | ICD-10-CM | POA: Diagnosis not present

## 2019-04-05 DIAGNOSIS — Z794 Long term (current) use of insulin: Secondary | ICD-10-CM | POA: Diagnosis not present

## 2019-04-05 DIAGNOSIS — L02611 Cutaneous abscess of right foot: Secondary | ICD-10-CM | POA: Diagnosis not present

## 2019-04-06 DIAGNOSIS — M869 Osteomyelitis, unspecified: Secondary | ICD-10-CM | POA: Diagnosis not present

## 2019-04-06 DIAGNOSIS — C7651 Malignant neoplasm of right lower limb: Secondary | ICD-10-CM | POA: Diagnosis not present

## 2019-04-06 DIAGNOSIS — I129 Hypertensive chronic kidney disease with stage 1 through stage 4 chronic kidney disease, or unspecified chronic kidney disease: Secondary | ICD-10-CM | POA: Diagnosis not present

## 2019-04-06 DIAGNOSIS — Z794 Long term (current) use of insulin: Secondary | ICD-10-CM | POA: Diagnosis not present

## 2019-04-06 DIAGNOSIS — L03115 Cellulitis of right lower limb: Secondary | ICD-10-CM | POA: Diagnosis not present

## 2019-04-06 DIAGNOSIS — R633 Feeding difficulties: Secondary | ICD-10-CM | POA: Diagnosis not present

## 2019-04-06 DIAGNOSIS — N183 Chronic kidney disease, stage 3 (moderate): Secondary | ICD-10-CM | POA: Diagnosis not present

## 2019-04-06 DIAGNOSIS — E1122 Type 2 diabetes mellitus with diabetic chronic kidney disease: Secondary | ICD-10-CM | POA: Diagnosis not present

## 2019-04-06 DIAGNOSIS — E118 Type 2 diabetes mellitus with unspecified complications: Secondary | ICD-10-CM | POA: Diagnosis not present

## 2019-04-07 ENCOUNTER — Encounter: Payer: Medicaid Other | Admitting: Family Medicine

## 2019-04-07 DIAGNOSIS — Z89411 Acquired absence of right great toe: Secondary | ICD-10-CM | POA: Diagnosis not present

## 2019-04-07 DIAGNOSIS — R633 Feeding difficulties: Secondary | ICD-10-CM | POA: Diagnosis not present

## 2019-04-07 DIAGNOSIS — M869 Osteomyelitis, unspecified: Secondary | ICD-10-CM | POA: Diagnosis not present

## 2019-04-07 DIAGNOSIS — I129 Hypertensive chronic kidney disease with stage 1 through stage 4 chronic kidney disease, or unspecified chronic kidney disease: Secondary | ICD-10-CM | POA: Diagnosis not present

## 2019-04-07 DIAGNOSIS — E1122 Type 2 diabetes mellitus with diabetic chronic kidney disease: Secondary | ICD-10-CM | POA: Diagnosis not present

## 2019-04-07 DIAGNOSIS — N189 Chronic kidney disease, unspecified: Secondary | ICD-10-CM | POA: Diagnosis not present

## 2019-04-07 DIAGNOSIS — Z89421 Acquired absence of other right toe(s): Secondary | ICD-10-CM | POA: Diagnosis not present

## 2019-04-07 DIAGNOSIS — E1165 Type 2 diabetes mellitus with hyperglycemia: Secondary | ICD-10-CM | POA: Diagnosis not present

## 2019-04-07 DIAGNOSIS — Z794 Long term (current) use of insulin: Secondary | ICD-10-CM | POA: Diagnosis not present

## 2019-04-07 DIAGNOSIS — M86171 Other acute osteomyelitis, right ankle and foot: Secondary | ICD-10-CM | POA: Diagnosis not present

## 2019-04-07 DIAGNOSIS — L03115 Cellulitis of right lower limb: Secondary | ICD-10-CM | POA: Diagnosis not present

## 2019-04-08 DIAGNOSIS — N183 Chronic kidney disease, stage 3 (moderate): Secondary | ICD-10-CM | POA: Diagnosis not present

## 2019-04-08 DIAGNOSIS — L03115 Cellulitis of right lower limb: Secondary | ICD-10-CM | POA: Diagnosis not present

## 2019-04-08 DIAGNOSIS — I2510 Atherosclerotic heart disease of native coronary artery without angina pectoris: Secondary | ICD-10-CM | POA: Diagnosis not present

## 2019-04-08 DIAGNOSIS — B9562 Methicillin resistant Staphylococcus aureus infection as the cause of diseases classified elsewhere: Secondary | ICD-10-CM | POA: Diagnosis not present

## 2019-04-08 DIAGNOSIS — E119 Type 2 diabetes mellitus without complications: Secondary | ICD-10-CM | POA: Diagnosis not present

## 2019-04-08 DIAGNOSIS — L97514 Non-pressure chronic ulcer of other part of right foot with necrosis of bone: Secondary | ICD-10-CM | POA: Diagnosis not present

## 2019-04-08 DIAGNOSIS — M86171 Other acute osteomyelitis, right ankle and foot: Secondary | ICD-10-CM | POA: Diagnosis not present

## 2019-04-08 DIAGNOSIS — Z89421 Acquired absence of other right toe(s): Secondary | ICD-10-CM | POA: Diagnosis not present

## 2019-04-08 DIAGNOSIS — Z794 Long term (current) use of insulin: Secondary | ICD-10-CM | POA: Diagnosis not present

## 2019-04-08 DIAGNOSIS — I129 Hypertensive chronic kidney disease with stage 1 through stage 4 chronic kidney disease, or unspecified chronic kidney disease: Secondary | ICD-10-CM | POA: Diagnosis not present

## 2019-04-08 DIAGNOSIS — E1165 Type 2 diabetes mellitus with hyperglycemia: Secondary | ICD-10-CM | POA: Diagnosis not present

## 2019-04-08 DIAGNOSIS — I509 Heart failure, unspecified: Secondary | ICD-10-CM | POA: Diagnosis not present

## 2019-04-08 DIAGNOSIS — E1122 Type 2 diabetes mellitus with diabetic chronic kidney disease: Secondary | ICD-10-CM | POA: Diagnosis not present

## 2019-04-08 DIAGNOSIS — L089 Local infection of the skin and subcutaneous tissue, unspecified: Secondary | ICD-10-CM | POA: Diagnosis not present

## 2019-04-08 DIAGNOSIS — R633 Feeding difficulties: Secondary | ICD-10-CM | POA: Diagnosis not present

## 2019-04-09 DIAGNOSIS — R633 Feeding difficulties: Secondary | ICD-10-CM | POA: Diagnosis not present

## 2019-04-09 DIAGNOSIS — E1122 Type 2 diabetes mellitus with diabetic chronic kidney disease: Secondary | ICD-10-CM | POA: Diagnosis not present

## 2019-04-09 DIAGNOSIS — I2510 Atherosclerotic heart disease of native coronary artery without angina pectoris: Secondary | ICD-10-CM | POA: Diagnosis not present

## 2019-04-09 DIAGNOSIS — Z794 Long term (current) use of insulin: Secondary | ICD-10-CM | POA: Diagnosis not present

## 2019-04-09 DIAGNOSIS — M86171 Other acute osteomyelitis, right ankle and foot: Secondary | ICD-10-CM | POA: Diagnosis not present

## 2019-04-09 DIAGNOSIS — N186 End stage renal disease: Secondary | ICD-10-CM | POA: Diagnosis not present

## 2019-04-09 DIAGNOSIS — B9562 Methicillin resistant Staphylococcus aureus infection as the cause of diseases classified elsewhere: Secondary | ICD-10-CM | POA: Diagnosis not present

## 2019-04-09 DIAGNOSIS — L03115 Cellulitis of right lower limb: Secondary | ICD-10-CM | POA: Diagnosis not present

## 2019-04-09 DIAGNOSIS — Z89421 Acquired absence of other right toe(s): Secondary | ICD-10-CM | POA: Diagnosis not present

## 2019-04-09 DIAGNOSIS — L97514 Non-pressure chronic ulcer of other part of right foot with necrosis of bone: Secondary | ICD-10-CM | POA: Diagnosis not present

## 2019-04-09 DIAGNOSIS — N183 Chronic kidney disease, stage 3 (moderate): Secondary | ICD-10-CM | POA: Diagnosis not present

## 2019-04-09 DIAGNOSIS — I509 Heart failure, unspecified: Secondary | ICD-10-CM | POA: Diagnosis not present

## 2019-04-09 DIAGNOSIS — E119 Type 2 diabetes mellitus without complications: Secondary | ICD-10-CM | POA: Diagnosis not present

## 2019-04-09 DIAGNOSIS — E1165 Type 2 diabetes mellitus with hyperglycemia: Secondary | ICD-10-CM | POA: Diagnosis not present

## 2019-04-09 DIAGNOSIS — L089 Local infection of the skin and subcutaneous tissue, unspecified: Secondary | ICD-10-CM | POA: Diagnosis not present

## 2019-04-09 DIAGNOSIS — I129 Hypertensive chronic kidney disease with stage 1 through stage 4 chronic kidney disease, or unspecified chronic kidney disease: Secondary | ICD-10-CM | POA: Diagnosis not present

## 2019-04-10 DIAGNOSIS — N183 Chronic kidney disease, stage 3 (moderate): Secondary | ICD-10-CM | POA: Diagnosis not present

## 2019-04-10 DIAGNOSIS — L03115 Cellulitis of right lower limb: Secondary | ICD-10-CM | POA: Diagnosis not present

## 2019-04-10 DIAGNOSIS — E1122 Type 2 diabetes mellitus with diabetic chronic kidney disease: Secondary | ICD-10-CM | POA: Diagnosis not present

## 2019-04-10 DIAGNOSIS — I129 Hypertensive chronic kidney disease with stage 1 through stage 4 chronic kidney disease, or unspecified chronic kidney disease: Secondary | ICD-10-CM | POA: Diagnosis not present

## 2019-04-11 DIAGNOSIS — L03115 Cellulitis of right lower limb: Secondary | ICD-10-CM | POA: Diagnosis not present

## 2019-04-11 DIAGNOSIS — I129 Hypertensive chronic kidney disease with stage 1 through stage 4 chronic kidney disease, or unspecified chronic kidney disease: Secondary | ICD-10-CM | POA: Diagnosis not present

## 2019-04-11 DIAGNOSIS — N183 Chronic kidney disease, stage 3 (moderate): Secondary | ICD-10-CM | POA: Diagnosis not present

## 2019-04-11 DIAGNOSIS — E1122 Type 2 diabetes mellitus with diabetic chronic kidney disease: Secondary | ICD-10-CM | POA: Diagnosis not present

## 2019-04-12 DIAGNOSIS — L03115 Cellulitis of right lower limb: Secondary | ICD-10-CM | POA: Diagnosis not present

## 2019-04-12 DIAGNOSIS — E1122 Type 2 diabetes mellitus with diabetic chronic kidney disease: Secondary | ICD-10-CM | POA: Diagnosis not present

## 2019-04-12 DIAGNOSIS — E1165 Type 2 diabetes mellitus with hyperglycemia: Secondary | ICD-10-CM | POA: Diagnosis not present

## 2019-04-12 DIAGNOSIS — Z794 Long term (current) use of insulin: Secondary | ICD-10-CM | POA: Diagnosis not present

## 2019-04-12 DIAGNOSIS — I129 Hypertensive chronic kidney disease with stage 1 through stage 4 chronic kidney disease, or unspecified chronic kidney disease: Secondary | ICD-10-CM | POA: Diagnosis not present

## 2019-04-12 DIAGNOSIS — R633 Feeding difficulties: Secondary | ICD-10-CM | POA: Diagnosis not present

## 2019-04-12 DIAGNOSIS — N183 Chronic kidney disease, stage 3 (moderate): Secondary | ICD-10-CM | POA: Diagnosis not present

## 2019-04-13 DIAGNOSIS — E1165 Type 2 diabetes mellitus with hyperglycemia: Secondary | ICD-10-CM | POA: Diagnosis not present

## 2019-04-13 DIAGNOSIS — L03115 Cellulitis of right lower limb: Secondary | ICD-10-CM | POA: Diagnosis not present

## 2019-04-13 DIAGNOSIS — L97514 Non-pressure chronic ulcer of other part of right foot with necrosis of bone: Secondary | ICD-10-CM | POA: Diagnosis not present

## 2019-04-13 DIAGNOSIS — I509 Heart failure, unspecified: Secondary | ICD-10-CM | POA: Diagnosis not present

## 2019-04-13 DIAGNOSIS — Z794 Long term (current) use of insulin: Secondary | ICD-10-CM | POA: Diagnosis not present

## 2019-04-13 DIAGNOSIS — M86171 Other acute osteomyelitis, right ankle and foot: Secondary | ICD-10-CM | POA: Diagnosis not present

## 2019-04-13 DIAGNOSIS — N179 Acute kidney failure, unspecified: Secondary | ICD-10-CM | POA: Diagnosis not present

## 2019-04-13 DIAGNOSIS — E119 Type 2 diabetes mellitus without complications: Secondary | ICD-10-CM | POA: Diagnosis not present

## 2019-04-13 DIAGNOSIS — E1122 Type 2 diabetes mellitus with diabetic chronic kidney disease: Secondary | ICD-10-CM | POA: Diagnosis not present

## 2019-04-13 DIAGNOSIS — I2510 Atherosclerotic heart disease of native coronary artery without angina pectoris: Secondary | ICD-10-CM | POA: Diagnosis not present

## 2019-04-13 DIAGNOSIS — I129 Hypertensive chronic kidney disease with stage 1 through stage 4 chronic kidney disease, or unspecified chronic kidney disease: Secondary | ICD-10-CM | POA: Diagnosis not present

## 2019-04-13 DIAGNOSIS — N189 Chronic kidney disease, unspecified: Secondary | ICD-10-CM | POA: Diagnosis not present

## 2019-04-13 DIAGNOSIS — R633 Feeding difficulties: Secondary | ICD-10-CM | POA: Diagnosis not present

## 2019-04-13 DIAGNOSIS — N183 Chronic kidney disease, stage 3 (moderate): Secondary | ICD-10-CM | POA: Diagnosis not present

## 2019-04-13 DIAGNOSIS — L089 Local infection of the skin and subcutaneous tissue, unspecified: Secondary | ICD-10-CM | POA: Diagnosis not present

## 2019-04-13 DIAGNOSIS — Z89421 Acquired absence of other right toe(s): Secondary | ICD-10-CM | POA: Diagnosis not present

## 2019-04-13 DIAGNOSIS — B9562 Methicillin resistant Staphylococcus aureus infection as the cause of diseases classified elsewhere: Secondary | ICD-10-CM | POA: Diagnosis not present

## 2019-04-14 DIAGNOSIS — I5020 Unspecified systolic (congestive) heart failure: Secondary | ICD-10-CM | POA: Diagnosis not present

## 2019-04-14 DIAGNOSIS — R633 Feeding difficulties: Secondary | ICD-10-CM | POA: Diagnosis not present

## 2019-04-14 DIAGNOSIS — Z794 Long term (current) use of insulin: Secondary | ICD-10-CM | POA: Diagnosis not present

## 2019-04-14 DIAGNOSIS — R931 Abnormal findings on diagnostic imaging of heart and coronary circulation: Secondary | ICD-10-CM | POA: Diagnosis not present

## 2019-04-14 DIAGNOSIS — E1165 Type 2 diabetes mellitus with hyperglycemia: Secondary | ICD-10-CM | POA: Diagnosis not present

## 2019-04-14 DIAGNOSIS — L97519 Non-pressure chronic ulcer of other part of right foot with unspecified severity: Secondary | ICD-10-CM | POA: Diagnosis not present

## 2019-04-14 DIAGNOSIS — I2510 Atherosclerotic heart disease of native coronary artery without angina pectoris: Secondary | ICD-10-CM | POA: Diagnosis not present

## 2019-04-14 MED ORDER — OLANZAPINE-FLUOXETINE HCL 6-50 MG PO CAPS
6.00 | ORAL_CAPSULE | ORAL | Status: DC
Start: ? — End: 2019-04-14

## 2019-04-14 MED ORDER — CVS KIDPANT BOYS X-LARGE MISC
40.00 | Status: DC
Start: 2019-04-14 — End: 2019-04-14

## 2019-04-14 MED ORDER — Medication
100.00 | Status: DC
Start: 2019-04-15 — End: 2019-04-14

## 2019-04-14 MED ORDER — AEROCHAMBER PLUS MISC
0.50 | Status: DC
Start: ? — End: 2019-04-14

## 2019-04-14 MED ORDER — COMPOUND W FREEZE OFF EX AERO
6.00 | INHALATION_SPRAY | CUTANEOUS | Status: DC
Start: 2019-04-14 — End: 2019-04-14

## 2019-04-14 MED ORDER — CELLULOSE SODIUM PHOSPHATE VI
5000.00 | Status: DC
Start: 2019-04-14 — End: 2019-04-14

## 2019-04-14 MED ORDER — VICON FORTE PO CAPS
17.00 | ORAL_CAPSULE | ORAL | Status: DC
Start: ? — End: 2019-04-14

## 2019-04-14 MED ORDER — CATALYTIC FORMULA PO
25.00 | ORAL | Status: DC
Start: 2019-04-14 — End: 2019-04-14

## 2019-04-14 MED ORDER — BENICAR 20 MG PO TABS
12.50 | ORAL_TABLET | ORAL | Status: DC
Start: ? — End: 2019-04-14

## 2019-04-14 MED ORDER — Medication
5.00 | Status: DC
Start: ? — End: 2019-04-14

## 2019-04-14 MED ORDER — Medication
400.00 | Status: DC
Start: 2019-04-14 — End: 2019-04-14

## 2019-04-14 MED ORDER — Medication
90.00 | Status: DC
Start: 2019-04-15 — End: 2019-04-14

## 2019-04-14 MED ORDER — Medication
30.00 | Status: DC
Start: ? — End: 2019-04-14

## 2019-04-14 MED ORDER — ASPIRIN 81 MG PO CHEW
81.00 | CHEWABLE_TABLET | ORAL | Status: DC
Start: 2019-04-15 — End: 2019-04-14

## 2019-04-14 MED ORDER — COMPOUND W FREEZE OFF EX AERO
0.00 | INHALATION_SPRAY | CUTANEOUS | Status: DC
Start: 2019-04-14 — End: 2019-04-14

## 2019-04-14 MED ORDER — INSULIN GLARGINE 100 UNIT/ML ~~LOC~~ SOLN
25.00 | SUBCUTANEOUS | Status: DC
Start: 2019-04-14 — End: 2019-04-14

## 2019-04-14 MED ORDER — RAPAMUNE 1 MG/ML PO SOLN
100.00 | ORAL | Status: DC
Start: 2019-04-14 — End: 2019-04-14

## 2019-04-14 MED ORDER — NASAL SPRAY 0.65 % NA SOLN
100.00 | NASAL | Status: DC
Start: 2019-04-14 — End: 2019-04-14

## 2019-04-14 MED ORDER — FUROSEMIDE 20 MG PO TABS
10.00 | ORAL_TABLET | ORAL | Status: DC
Start: 2019-04-15 — End: 2019-04-14

## 2019-04-14 MED ORDER — BURN RELIEF ALOE EX
200.00 | CUTANEOUS | Status: DC
Start: ? — End: 2019-04-14

## 2019-04-14 MED ORDER — QUINERVA 260 MG PO TABS
650.00 | ORAL_TABLET | ORAL | Status: DC
Start: ? — End: 2019-04-14

## 2019-04-15 ENCOUNTER — Telehealth: Payer: Self-pay

## 2019-04-15 DIAGNOSIS — L97519 Non-pressure chronic ulcer of other part of right foot with unspecified severity: Secondary | ICD-10-CM | POA: Diagnosis not present

## 2019-04-15 NOTE — Telephone Encounter (Signed)
Verbal given for [facility] home health care.

## 2019-04-20 DIAGNOSIS — L089 Local infection of the skin and subcutaneous tissue, unspecified: Secondary | ICD-10-CM | POA: Diagnosis not present

## 2019-04-20 DIAGNOSIS — Z09 Encounter for follow-up examination after completed treatment for conditions other than malignant neoplasm: Secondary | ICD-10-CM | POA: Diagnosis not present

## 2019-04-30 ENCOUNTER — Other Ambulatory Visit: Payer: Self-pay | Admitting: Physician Assistant

## 2019-04-30 NOTE — Telephone Encounter (Signed)
Please schedule F/U appointment with Dr. Fletcher Anon. Thank you!

## 2019-05-04 DIAGNOSIS — S98141D Partial traumatic amputation of one right lesser toe, subsequent encounter: Secondary | ICD-10-CM | POA: Diagnosis not present

## 2019-05-04 DIAGNOSIS — E1140 Type 2 diabetes mellitus with diabetic neuropathy, unspecified: Secondary | ICD-10-CM | POA: Diagnosis not present

## 2019-05-04 NOTE — Telephone Encounter (Signed)
Scheduled please send per wife

## 2019-05-04 NOTE — Telephone Encounter (Signed)
Route to refills

## 2019-05-06 ENCOUNTER — Telehealth: Payer: Self-pay | Admitting: Cardiovascular Disease

## 2019-05-06 ENCOUNTER — Ambulatory Visit: Payer: Medicaid Other | Admitting: Cardiovascular Disease

## 2019-05-06 NOTE — Telephone Encounter (Signed)
Virtual Visit Pre-Appointment Phone Call  "(Name), I am calling you today to discuss your upcoming appointment. We are currently trying to limit exposure to the virus that causes COVID-19 by seeing patients at home rather than in the office."  1. "What is the BEST phone number to call the day of the visit?" - include this in appointment notes  2. Do you have or have access to (through a family member/friend) a smartphone with video capability that we can use for your visit?" a. If yes - list this number in appt notes as cell (if different from BEST phone #) and list the appointment type as a VIDEO visit in appointment notes b. If no - list the appointment type as a PHONE visit in appointment notes  3. Confirm consent - "In the setting of the current Covid19 crisis, you are scheduled for a (phone or video) visit with your provider on (date) at (time).  Just as we do with many in-office visits, in order for you to participate in this visit, we must obtain consent.  If you'd like, I can send this to your mychart (if signed up) or email for you to review.  Otherwise, I can obtain your verbal consent now.  All virtual visits are billed to your insurance company just like a normal visit would be.  By agreeing to a virtual visit, we'd like you to understand that the technology does not allow for your provider to perform an examination, and thus may limit your provider's ability to fully assess your condition. If your provider identifies any concerns that need to be evaluated in person, we will make arrangements to do so.  Finally, though the technology is pretty good, we cannot assure that it will always work on either your or our end, and in the setting of a video visit, we may have to convert it to a phone-only visit.  In either situation, we cannot ensure that we have a secure connection.  Are you willing to proceed?" STAFF: Did the patient verbally acknowledge consent to telehealth visit? Document  YES/NO here: YES  4. Advise patient to be prepared - "Two hours prior to your appointment, go ahead and check your blood pressure, pulse, oxygen saturation, and your weight (if you have the equipment to check those) and write them all down. When your visit starts, your provider will ask you for this information. If you have an Apple Watch or Kardia device, please plan to have heart rate information ready on the day of your appointment. Please have a pen and paper handy nearby the day of the visit as well."  5. Give patient instructions for MyChart download to smartphone OR Doximity/Doxy.me as below if video visit (depending on what platform provider is using)  6. Inform patient they will receive a phone call 15 minutes prior to their appointment time (may be from unknown caller ID) so they should be prepared to answer    TELEPHONE CALL NOTE  [name] has been deemed a candidate for a follow-up tele-health visit to limit community exposure during the Covid-19 pandemic. I spoke with the patient via phone to ensure availability of phone/video source, confirm preferred email & phone number, and discuss instructions and expectations.  I reminded [name] to be prepared with any vital sign and/or heart rhythm information that could potentially be obtained via home monitoring, at the time of his visit. I reminded [name] to expect a phone call prior to  his visit.  Clarisse Gouge 05/06/2019 9:32 AM   INSTRUCTIONS FOR DOWNLOADING THE MYCHART APP TO SMARTPHONE  - The patient must first make sure to have activated MyChart and know their login information - If Apple, go to [employer] and type in MyChart in the search bar and download the app. If Android, ask patient to go to [employer] and type in [place] in the search bar and download the app. The app is free but as with any other app downloads, their phone may require them to verify saved payment information or  Apple/Android password.  - The patient will need to then log into the app with their MyChart username and password, and select [facility] as their healthcare provider to link the account. When it is time for your visit, go to the MyChart app, find appointments, and click Begin Video Visit. Be sure to Select Allow for your device to access the Microphone and Camera for your visit. You will then be connected, and your provider will be with you shortly.  **If they have any issues connecting, or need assistance please contact MyChart service desk [phone])**  **If using a computer, in order to ensure the best quality for their visit they will need to use either of the following Internet Browsers: [employer], or Google Chrome**  IF USING DOXIMITY or DOXY.ME - The patient will receive a link just prior to their visit by text.     FULL LENGTH CONSENT FOR TELE-HEALTH VISIT   I hereby voluntarily request, consent and authorize [facility] and its employed or contracted physicians, physician assistants, nurse practitioners or other licensed health care professionals (the Practitioner), to provide me with telemedicine health care services (the Services") as deemed necessary by the treating Practitioner. I acknowledge and consent to receive the Services by the Practitioner via telemedicine. I understand that the telemedicine visit will involve communicating with the Practitioner through live audiovisual communication technology and the disclosure of certain medical information by electronic transmission. I acknowledge that I have been given the opportunity to request an in-person assessment or other available alternative prior to the telemedicine visit and am voluntarily participating in the telemedicine visit.  I understand that I have the right to withhold or withdraw my consent to the use of telemedicine in the course of my care at any time, without affecting my right to future care  or treatment, and that the Practitioner or I may terminate the telemedicine visit at any time. I understand that I have the right to inspect all information obtained and/or recorded in the course of the telemedicine visit and may receive copies of available information for a reasonable fee.  I understand that some of the potential risks of receiving the Services via telemedicine include:   Delay or interruption in medical evaluation due to technological equipment failure or disruption;  Information transmitted may not be sufficient (e.g. poor resolution of images) to allow for appropriate medical decision making by the Practitioner; and/or   In rare instances, security protocols could fail, causing a breach of personal health information.  Furthermore, I acknowledge that it is my responsibility to provide information about my medical history, conditions and care that is complete and accurate to the best of my ability. I acknowledge that Practitioner's advice, recommendations, and/or decision may be based on factors not within their control, such as incomplete or inaccurate data provided by me or distortions of diagnostic images or specimens that may result from electronic transmissions. I  understand that the practice of medicine is not an exact science and that Practitioner makes no warranties or guarantees regarding treatment outcomes. I acknowledge that I will receive a copy of this consent concurrently upon execution via email to the email address I last provided but may also request a printed copy by calling the office of [facility].    I understand that my insurance will be billed for this visit.   I have read or had this consent read to me.  I understand the contents of this consent, which adequately explains the benefits and risks of the Services being provided via telemedicine.   I have been provided ample opportunity to ask questions regarding this consent and the Services and have had  my questions answered to my satisfaction.  I give my informed consent for the services to be provided through the use of telemedicine in my medical care  By participating in this telemedicine visit I agree to the above.

## 2019-05-18 DIAGNOSIS — E1169 Type 2 diabetes mellitus with other specified complication: Secondary | ICD-10-CM | POA: Diagnosis not present

## 2019-05-18 DIAGNOSIS — I130 Hypertensive heart and chronic kidney disease with heart failure and stage 1 through stage 4 chronic kidney disease, or unspecified chronic kidney disease: Secondary | ICD-10-CM | POA: Diagnosis not present

## 2019-05-18 DIAGNOSIS — E1122 Type 2 diabetes mellitus with diabetic chronic kidney disease: Secondary | ICD-10-CM | POA: Diagnosis not present

## 2019-05-18 DIAGNOSIS — N183 Chronic kidney disease, stage 3 unspecified: Secondary | ICD-10-CM | POA: Diagnosis not present

## 2019-05-18 DIAGNOSIS — E1140 Type 2 diabetes mellitus with diabetic neuropathy, unspecified: Secondary | ICD-10-CM | POA: Diagnosis not present

## 2019-05-18 DIAGNOSIS — I2510 Atherosclerotic heart disease of native coronary artery without angina pectoris: Secondary | ICD-10-CM | POA: Diagnosis not present

## 2019-05-18 DIAGNOSIS — Z4781 Encounter for orthopedic aftercare following surgical amputation: Secondary | ICD-10-CM | POA: Diagnosis not present

## 2019-05-18 DIAGNOSIS — M86671 Other chronic osteomyelitis, right ankle and foot: Secondary | ICD-10-CM | POA: Diagnosis not present

## 2019-05-18 DIAGNOSIS — I5022 Chronic systolic (congestive) heart failure: Secondary | ICD-10-CM | POA: Diagnosis not present

## 2019-05-20 ENCOUNTER — Encounter: Payer: Self-pay | Admitting: Cardiovascular Disease

## 2019-05-20 ENCOUNTER — Other Ambulatory Visit: Payer: Self-pay

## 2019-05-20 ENCOUNTER — Telehealth (INDEPENDENT_AMBULATORY_CARE_PROVIDER_SITE_OTHER): Payer: Medicaid Other | Admitting: Cardiovascular Disease

## 2019-05-20 VITALS — BP 133/80 | Ht 68.0 in | Wt 190.0 lb

## 2019-05-20 DIAGNOSIS — I2510 Atherosclerotic heart disease of native coronary artery without angina pectoris: Secondary | ICD-10-CM | POA: Diagnosis not present

## 2019-05-20 DIAGNOSIS — E782 Mixed hyperlipidemia: Secondary | ICD-10-CM

## 2019-05-20 DIAGNOSIS — I5022 Chronic systolic (congestive) heart failure: Secondary | ICD-10-CM

## 2019-05-20 DIAGNOSIS — I10 Essential (primary) hypertension: Secondary | ICD-10-CM

## 2019-05-20 NOTE — Progress Notes (Signed)
Virtual Visit via Telephone Note   This visit type was conducted due to national recommendations for restrictions regarding the COVID-19 Pandemic (e.g. social distancing) in an effort to limit this patient's exposure and mitigate transmission in our community.  Due to his co-morbid illnesses, this patient is at least at moderate risk for complications without adequate follow up.  This format is felt to be most appropriate for this patient at this time.  The patient did not have access to video technology/had technical difficulties with video requiring transitioning to audio format only (telephone).  All issues noted in this document were discussed and addressed.  No physical exam could be performed with this format.  Please refer to the patient's chart for his  consent to telehealth for [facility].   Date:  05/20/2019   ID:  [name], DOB August 25, 1960, MRN [id number]  Patient Location: Home Provider Location: Office  PCP:  Olin Hauser, DO  Cardiologist:  [name], MD  Electrophysiologist:  None   Evaluation Performed:  Follow-Up Visit  Chief Complaint: No complaints today.  History of Present Illness:    [name] is a 58 y.o. male who was reached via phone for a follow-up visit.   He has past medical history of CAD, mixed ischemic and nonischemic cardiomyopathy, diabetes, hypertension, hyperlipidemia, and stage III chronic kidney disease.  He is status post drug-eluting stent placement to the LAD in April 2018.  At that time, he was found to have severe LV dysfunction with an EF of 25 to 30% and diffuse hypokinesis which was felt to be out of proportion to the LAD disease.  He also has a history of alcohol abuse.  A follow-up echocardiogram in July 2018 showed improved LV function with an EF of 40 to 45%.    Most recent echo in February 2019  showed stable LV dysfunction with an EF of 40 to 45% and grade 1 diastolic dysfunction.  Amlodipine was  discontinued due to leg edema.    He has been doing reasonably well with no recent chest pain or shortness of breath.  I discontinued Plavix during last visit.  He has been dealing with wounds in his feet and has been following at [facility] and wound center.  I did see notes from vascular surgery but do not have results of his vascular testing.  He had some toe amputation done.  The patient does not have symptoms concerning for COVID-19 infection (fever, chills, cough, or new shortness of breath).    Past Medical History:  Diagnosis Date  . Ankle pain   . Chronic combined systolic (congestive) and diastolic (congestive) heart failure ([place]) 10/2016   a. 10/2016 Echo: EF 25-30%, diff HK, mild MR; b. 01/2017 Echo:  EF 40-45%, moderate LVH, Gr1DD, calcified mitral annulus, mildly dilated left atrium, RV cavity size was normal with normal RVSF; c. 08/2017 Echo: EF 40-45%, sept, antsept HK, Gr1DD, Mild MR, mildly dil LA. Nl RV fxn.  . CKD (chronic kidney disease), stage III   . Coronary artery disease    a. 10/2016 Cath/PCI: LAD 79m(3.5x15 Xience Alpine DES). No other obstructive disease.  . Diabetic neuropathy ([place]   . Hyperlipidemia   . Hypertension   . 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 10/2016 Echo: EF 25-30%, diff HK; b. 01/2017 Echo: EF 40-45%; c. 08/2017 Echo: EF 40-45%.  . Pain in both feet    Past Surgical History:  Procedure Laterality  Date  . CORONARY STENT INTERVENTION N/A 10/21/2016   Procedure: Coronary Stent Intervention;  Surgeon: Wellington Hampshire, MD;  Location: [facility];  Service: Cardiovascular;  Laterality: N/A;  . LEFT HEART CATH AND CORONARY ANGIOGRAPHY N/A 10/21/2016   Procedure: Left Heart Cath and Coronary Angiography;  Surgeon: Wellington Hampshire, MD;  Location: [facility];  Service: Cardiovascular;  Laterality: N/A;  . TIBIA FRACTURE SURGERY Right 2002     Current Meds  Medication Sig  . ACCU-CHEK FASTCLIX LANCETS MISC  Check sugar up to 3 x daily as instructed  . ACCU-CHEK GUIDE test strip Check blood sugar up to 3 times daily as advised  . acetaminophen (TYLENOL 8 HOUR) 650 MG CR tablet Take 1 tablet (650 mg total) by mouth every 8 (eight) hours as needed for pain.  . Alpha-Lipoic Acid 600 MG CAPS Take 1 capsule by mouth daily.  [name] aspirin EC 81 MG tablet Take 81 mg by mouth.  [name] atorvastatin (LIPITOR) 40 MG tablet Take 1 tablet (40 mg total) by mouth at bedtime.  . blood glucose meter kit and supplies KIT Dispense brand based on patient and insurance preference. Use up to 3 times daily to check blood sugar. DX: ICD 10 E11.9 Diabetes Type 2.  . Blood Glucose Monitoring Suppl (ACCU-CHEK GUIDE) w/Device KIT 1 kit by Does not apply route daily as needed. Check blood sugar up to 3 times daily as advised  . Blood Pressure Monitoring (BLOOD PRESSURE CUFF) MISC 1 each by Does not apply route daily.  . carvedilol (COREG) 25 MG tablet Take 1 tablet (25 mg total) by mouth 2 (two) times daily with a meal.  . cholecalciferol (VITAMIN D) 1000 units tablet Take 1,000 Units by mouth daily.  . furosemide (LASIX) 20 MG tablet Take 0.5 tablets (10 mg total) by mouth daily.  [name] gabapentin (NEURONTIN) 400 MG capsule TAKE 2-3 CAPSULES BY MOUTH 3 (THREE) TIMES DAILY.  . hydrALAZINE (APRESOLINE) 100 MG tablet Take 1 tablet (100 mg total) by mouth 3 (three) times daily.  [name] HYDROcodone-acetaminophen (NORCO/VICODIN) 5-325 MG tablet 1-2 tabs po qd prn  . Insulin Pen Needle (B-D ULTRAFINE III SHORT PEN) 31G X 8 MM MISC Use to inject insulin and victoza, 2 needles per day  . isosorbide mononitrate (IMDUR) 60 MG 24 hr tablet TAKE 1.5 TABLETS (90 MG TOTAL) BY MOUTH DAILY.  [name] Lancets Misc. MISC Use  Brand compatable to insurance and monitor to check blood sugar up to 3 times daily. ICD10 E11.9  . LANTUS SOLOSTAR 100 UNIT/ML Solostar Pen Inject 40 Units into the skin at bedtime.  [name] telmisartan (MICARDIS) 80 MG tablet Please specify directions,  refills and quantity  . traMADol (ULTRAM) 50 MG tablet Take 1 tablet (50 mg total) by mouth every 6 (six) hours as needed.  [name] VICTOZA 18 MG/3ML SOPN INJECT 0.3 MLS INTO THE SKIN DAILY. START WITH 1.2MG DAILY FOR 4 WEEKS THEN INCREASE TO 1.8     Allergies:   Sulfamethoxazole-trimethoprim, Lisinopril, and Terbinafine and related   Social History   Tobacco Use  . Smoking status: Passive Smoke Exposure - Never Smoker  . Smokeless tobacco: Never Used  Substance Use Topics  . Alcohol use: Yes  . Drug use: No     Family Hx: The patient's family history includes Heart disease in his maternal grandfather; Hypertension in his mother.  ROS:   Please see the history of present illness.     All other systems reviewed and  are negative.   Prior CV studies:   The following studies were reviewed today:    Labs/Other Tests and Data Reviewed:    EKG:  No ECG reviewed.  Recent Labs: 07/16/2018: ALT 16; BUN 38; Creatinine, Ser 1.87; Hemoglobin 12.0; Platelets 227; Potassium 4.4; Sodium 127   Recent Lipid Panel Lab Results  Component Value Date/Time   CHOL 126 03/17/2018 04:01 PM   TRIG 308 (H) 03/17/2018 04:01 PM   HDL 35 (L) 03/17/2018 04:01 PM   CHOLHDL 3.6 03/17/2018 04:01 PM   CHOLHDL 3.4 10/20/2016 05:15 AM   LDLCALC 29 03/17/2018 04:01 PM   LDLDIRECT 52 03/17/2018 04:01 PM    Wt Readings from Last 3 Encounters:  05/20/19 190 lb (86.2 kg)  03/31/19 198 lb (89.8 kg)  12/11/18 200 lb (90.7 kg)     Objective:    Vital Signs:  BP 133/80   Ht _0  (1.727 m)   Wt 190 lb (86.2 kg)   BMI 28.89 kg/m    VITAL SIGNS:  reviewed  ASSESSMENT & PLAN:    1.  Coronary artery disease: Status post LAD stenting in April 2018.  He has done well since then without any recurrent chest pain or dyspnea.    Plavix was discontinued during last visit.  Continue aspirin indefinitely.  2.  Ischemic cardiomyopathy/HFrEF: Most recent ejection fraction was 40 to 45%.  He remains on beta-blocker,  ARB, hydralazine, nitrate, and Lasix.    3.  Essential hypertension: Blood pressure is reasonably controlled on current medications.  4.  Hyperlipidemia: Most recent lipid profile showed an LDL of 52.  Continue atorvastatin.  Elevated triglyceride is likely due to uncontrolled diabetes.  5.  Type 2 diabetes mellitus: Most recent hemoglobin A1c was 8.  6.  Stage III chronic kidney disease: Stable  6.  Diabetic foot ulceration: Currently being managed at [facility]  I could not find results of vascular testing and this should be considered in the near future if not done already.   COVID-19 Education: The signs and symptoms of COVID-19 were discussed with the patient and how to seek care for testing (follow up with PCP or arrange E-visit).  The importance of social distancing was discussed today.  Time:   Today, I have spent 5 minutes with the patient with telehealth technology discussing the above problems.     Medication Adjustments/Labs and Tests Ordered: Current medicines are reviewed at length with the patient today.  Concerns regarding medicines are outlined above.   Tests Ordered: No orders of the defined types were placed in this encounter.   Medication Changes: No orders of the defined types were placed in this encounter.   Disposition: In person follow-up visit in 4 months  Signed, [name], MD  05/20/2019 8:40 AM    [place]

## 2019-05-20 NOTE — Patient Instructions (Signed)
Medication Instructions:  Continue same medications *If you need a refill on your cardiac medications before your next appointment, please call your pharmacy*  Lab Work: None If you have labs (blood work) drawn today and your tests are completely normal, you will receive your results only by: . MyChart Message (if you have MyChart) OR . A paper copy in the mail If you have any lab test that is abnormal or we need to change your treatment, we will call you to review the results.  Testing/Procedures: None  Follow-Up: At [facility], you and your health needs are our priority.  As part of our continuing mission to provide you with exceptional heart care, we have created designated Provider Care Teams.  These Care Teams include your primary Cardiologist (physician) and Advanced Practice Providers (APPs -  Physician Assistants and Nurse Practitioners) who all work together to provide you with the care you need, when you need it.  Your next appointment:   4 months  The format for your next appointment:   In Person  Provider:    You may see Muhammad Arida, MD or one of the following Advanced Practice Providers on your designated Care Team:    Christopher Berge, NP  Ryan Dunn, PA-C  Jacquelyn Visser, PA-C   

## 2019-05-25 DIAGNOSIS — E11621 Type 2 diabetes mellitus with foot ulcer: Secondary | ICD-10-CM | POA: Diagnosis not present

## 2019-05-25 DIAGNOSIS — Z09 Encounter for follow-up examination after completed treatment for conditions other than malignant neoplasm: Secondary | ICD-10-CM | POA: Diagnosis not present

## 2019-05-25 DIAGNOSIS — Z89422 Acquired absence of other left toe(s): Secondary | ICD-10-CM | POA: Diagnosis not present

## 2019-05-25 DIAGNOSIS — L97412 Non-pressure chronic ulcer of right heel and midfoot with fat layer exposed: Secondary | ICD-10-CM | POA: Diagnosis not present

## 2019-06-17 DIAGNOSIS — E1140 Type 2 diabetes mellitus with diabetic neuropathy, unspecified: Secondary | ICD-10-CM | POA: Diagnosis not present

## 2019-06-17 DIAGNOSIS — B351 Tinea unguium: Secondary | ICD-10-CM | POA: Diagnosis not present

## 2019-06-17 DIAGNOSIS — Z89431 Acquired absence of right foot: Secondary | ICD-10-CM | POA: Diagnosis not present

## 2019-06-18 ENCOUNTER — Encounter: Payer: Self-pay | Admitting: Family Medicine

## 2019-06-18 ENCOUNTER — Ambulatory Visit (INDEPENDENT_AMBULATORY_CARE_PROVIDER_SITE_OTHER): Payer: Medicaid Other | Admitting: Family Medicine

## 2019-06-18 ENCOUNTER — Other Ambulatory Visit: Payer: Self-pay

## 2019-06-18 VITALS — BP 138/69 | HR 72 | Temp 97.5°F | Ht 68.0 in | Wt 193.2 lb

## 2019-06-18 DIAGNOSIS — S98131D Complete traumatic amputation of one right lesser toe, subsequent encounter: Secondary | ICD-10-CM

## 2019-06-18 DIAGNOSIS — E11622 Type 2 diabetes mellitus with other skin ulcer: Secondary | ICD-10-CM

## 2019-06-18 DIAGNOSIS — L98499 Non-pressure chronic ulcer of skin of other sites with unspecified severity: Secondary | ICD-10-CM | POA: Diagnosis not present

## 2019-06-18 DIAGNOSIS — E1142 Type 2 diabetes mellitus with diabetic polyneuropathy: Secondary | ICD-10-CM

## 2019-06-18 DIAGNOSIS — S98139A Complete traumatic amputation of one unspecified lesser toe, initial encounter: Secondary | ICD-10-CM | POA: Insufficient documentation

## 2019-06-18 DIAGNOSIS — Z89411 Acquired absence of right great toe: Secondary | ICD-10-CM | POA: Diagnosis not present

## 2019-06-18 MED ORDER — GABAPENTIN 400 MG PO CAPS: ORAL_CAPSULE | ORAL | 5 refills | Status: DC

## 2019-06-18 NOTE — Assessment & Plan Note (Signed)
improved A1c to 7.7 now, 03/2019 improved med adherence and lifestyle, also treated R toe x 2 amputation w/ history of chronic ulcer/osteo No hypoglycemia Complications - Hyperglycemia, CKD-III, peripheral neuropathy, DM retinopathy, DM ulceration Meds - contraindicated Metformin with CKD / Failed Bydureon BCise x 2 trials (nausea)  Plan:  1.  Continue Lantus 30u daily, Victoza 1.8 u daily 2. Encourage improved lifestyle - low carb, low sugar diet, reduce portion size, continue improving regular exercise 3. Check CBG, bring log to next visit for review 4. Continue ASA, ARB, Statin

## 2019-06-18 NOTE — Patient Instructions (Addendum)
Thank you for coming to the office today.  Will order Diabetic shoes today, stay tuned check with company within 1-2 weeks.  RE ordered refill on Gabapentin  Please schedule a Follow-up Appointment to: Return in about 4 months (around 10/17/2019) for 4 month follow-up DM A1c.  If you have any other questions or concerns, please feel free to call the office or send a message through [facility]. You may also schedule an earlier appointment if necessary.  Additionally, you may be receiving a survey about your experience at our office within a few days to 1 week by e-mail or mail. We value your feedback.  [name], DO [place]

## 2019-06-18 NOTE — Progress Notes (Signed)
Subjective:    Patient ID: [name], male    DOB: 03-04-1961, 58 y.o.   MRN: [id number]  [name] is a 58 y.o. male presenting on 06/18/2019 for Diabetes   HPI   CHRONIC DM, Type 2 with DM Neuropathy/ CKD-III Overweight BMI >29 - Last visit with me 11/2018, for same problem diabetes, treated with continued victoza and lantus, see prior notes for background information. - Interval update with now adherence to meds. Last A1c 7.7 - significantly improved (03/2019) - Today patient reports doing well with improved sugar control CBG: Avg120-140, Low>100, High<250. Checks fasting AM most days Meds: - Victoza 1.8mg  inj daily - Lantus 30udaily - OFFMetformin due to CKD. Failed Bydureon in past due to nausea Reports good compliance. 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 - Currently he is doing well post-op from 03/2019, completed wound care - He has had nail treatments from podiatry - He is functioning well with R foot having 3 toes, s/p amputations now. - He is due for diabetic shoe orders today by request.  Health Maintenance: Due for Flu Shot, declines today despite counseling on benefits Declines Pneumonia vaccine as diabetic.  Depression screen [facility] 2/9 12/02/2018 08/31/2018 02/23/2018  Decreased Interest 0 0 0  Down, Depressed, Hopeless 0 0 0  PHQ - 2 Score 0 0 0    Social History   Tobacco Use  . Smoking status: Never Smoker  . Smokeless tobacco: Never  Used  Substance Use Topics  . Alcohol use: Not Currently    Comment: quit drinking x 6 mths ago  . Drug use: No    Review of Systems Per HPI unless specifically indicated above     Objective:    BP 138/69 (BP Location: Left Arm, Patient Position: Sitting, Cuff Size: Large)   Pulse 72   Temp (!) 97.5 F (36.4 C) (Oral)   Ht 5\' 8"  (1.727 m)   Wt 193 lb 3.2 oz (87.6 kg)   BMI 29.38 kg/m   Wt Readings from Last 3 Encounters:  06/18/19 193 lb 3.2 oz (87.6 kg)  05/20/19 190 lb (86.2 kg)  03/31/19 198 lb (89.8 kg)    Physical Exam Vitals signs and nursing note reviewed.  Constitutional:      General: He is not in acute distress.    Appearance: He is well-developed. He is not diaphoretic.     Comments: Well-appearing, comfortable, cooperative  HENT:     Head: Normocephalic and atraumatic.  Eyes:     General:        Right eye: No discharge.        Left eye: No discharge.     Conjunctiva/sclera: Conjunctivae normal.  Cardiovascular:     Rate and Rhythm: Normal rate.  Pulmonary:     Effort: Pulmonary effort is normal.  Skin:    General: Skin is warm and dry.     Findings: No erythema or rash.  Neurological:     Mental Status: He is alert and oriented to person, place, and time.  Psychiatric:        Behavior: Behavior normal.     Comments: Well groomed, good eye contact, normal speech and thoughts      Diabetic Foot Exam - Simple   Simple Foot Form Diabetic Foot exam was performed with the following findings: Yes 06/18/2019  2:34 PM  Visual Inspection See comments: Yes Sensation Testing See comments: Yes Pulse Check Posterior Tibialis and Dorsalis pulse intact bilaterally: Yes Comments Bilateral feet with some mild reduced monofilament sensation but overall mostly preserved sensation to light touch and monofilament. R foot with s/p chronic R great toe amputation well healed (08/2018) and R 5th little toe amputation healed (03/2019) without evidence of ulceration or  complication. He has some areas of thick callus formation. Foot deformity R side after amputations.      Results for orders placed or performed in visit on 06/18/19  Hemoglobin A1c  Result Value Ref Range   Hemoglobin A1C 7.7       Assessment & Plan:   Problem List Items Addressed This Visit    Right great toe amputee ([place])   DM type 2 with diabetic peripheral neuropathy ([place]) - Primary    improved A1c to 7.7 now, 03/2019 improved med adherence and lifestyle, also treated R toe x 2 amputation w/ history of chronic ulcer/osteo No hypoglycemia Complications - Hyperglycemia, CKD-III, peripheral neuropathy, DM retinopathy, DM ulceration Meds - contraindicated Metformin with CKD / Failed Bydureon BCise x 2 trials (nausea)  Plan:  1.  Continue Lantus 30u daily, Victoza 1.8 u daily 2. Encourage improved lifestyle - low carb, low sugar diet, reduce portion size, continue improving regular exercise 3. Check CBG, bring log to next visit for review 4. Continue ASA, ARB, Statin      Relevant Medications   gabapentin (NEURONTIN) 400 MG capsule   DM type 2 with diabetic chronic skin ulcer (HCC)   Amputation of little toe (HCC)    Other Visit Diagnoses    Diabetic polyneuropathy associated with type 2 diabetes mellitus ([place])       Relevant Medications   gabapentin (NEURONTIN) 400 MG capsule      Chronic bilateral diabetic neuropathy in both feet, as complication of diabetes Additionally with R foot x s/p 2 toe amputations (R great toe, R fifth little toe) resulting in foot deformity Evidence of callus formation both feet, heels and forefoot, with history of chronic recurrent ulceration  Completed DM Foot Exam today in office. See exam note.  Additional background info initial onset DM approx 12/2010 s/p R great toe amputation 09/10/18 s/p R 5th little toe amputation 04/05/19  Plan - Proceed with ordering Diabetic Shoes, completed form today, will fax back to [place] ([place] - fax [phone]) - Patient would benefit from Diabetic Shoes due to neuropathy with history of partial amputation of the foot (toes), previous foot ulceration, pre-ulcerative callus, peripheral neuropathy with evidence of callus formation, foot deformity. His diabetes control is improving on current regimen, and I am continuing to monitor and manage diabetes.    Meds ordered this encounter  Medications  . gabapentin (NEURONTIN) 400 MG capsule    Sig: TAKE 2-3 CAPSULES BY MOUTH 3 (THREE) TIMES DAILY.    Dispense:  270 capsule    Refill:  5      Follow up plan: Return in about 4 months (around 10/17/2019) for 4 month follow-up DM A1c.   [name], DO [facility] 06/18/2019, 2:04 PM

## 2019-07-15 DIAGNOSIS — E1140 Type 2 diabetes mellitus with diabetic neuropathy, unspecified: Secondary | ICD-10-CM | POA: Diagnosis not present

## 2019-07-15 DIAGNOSIS — Z89421 Acquired absence of other right toe(s): Secondary | ICD-10-CM | POA: Diagnosis not present

## 2019-07-15 DIAGNOSIS — B351 Tinea unguium: Secondary | ICD-10-CM | POA: Diagnosis not present

## 2019-07-15 DIAGNOSIS — M86171 Other acute osteomyelitis, right ankle and foot: Secondary | ICD-10-CM | POA: Diagnosis not present

## 2019-07-15 DIAGNOSIS — E1142 Type 2 diabetes mellitus with diabetic polyneuropathy: Secondary | ICD-10-CM | POA: Diagnosis not present

## 2019-07-15 DIAGNOSIS — Z89422 Acquired absence of other left toe(s): Secondary | ICD-10-CM | POA: Diagnosis not present

## 2019-08-09 DIAGNOSIS — S98211A Complete traumatic amputation of two or more right lesser toes, initial encounter: Secondary | ICD-10-CM | POA: Diagnosis not present

## 2019-08-09 DIAGNOSIS — E1140 Type 2 diabetes mellitus with diabetic neuropathy, unspecified: Secondary | ICD-10-CM | POA: Diagnosis not present

## 2019-09-26 ENCOUNTER — Other Ambulatory Visit: Payer: Self-pay | Admitting: Cardiovascular Disease

## 2019-09-28 DIAGNOSIS — M79674 Pain in right toe(s): Secondary | ICD-10-CM | POA: Diagnosis not present

## 2019-09-28 DIAGNOSIS — M7989 Other specified soft tissue disorders: Secondary | ICD-10-CM | POA: Diagnosis not present

## 2019-09-28 DIAGNOSIS — M79671 Pain in right foot: Secondary | ICD-10-CM | POA: Diagnosis not present

## 2019-09-28 DIAGNOSIS — E1140 Type 2 diabetes mellitus with diabetic neuropathy, unspecified: Secondary | ICD-10-CM | POA: Diagnosis not present

## 2019-09-28 DIAGNOSIS — Z89421 Acquired absence of other right toe(s): Secondary | ICD-10-CM | POA: Diagnosis not present

## 2019-09-28 DIAGNOSIS — B351 Tinea unguium: Secondary | ICD-10-CM | POA: Diagnosis not present

## 2019-10-01 ENCOUNTER — Other Ambulatory Visit: Payer: Self-pay | Admitting: Family Medicine

## 2019-10-01 DIAGNOSIS — E1142 Type 2 diabetes mellitus with diabetic polyneuropathy: Secondary | ICD-10-CM

## 2019-10-18 ENCOUNTER — Other Ambulatory Visit: Payer: Self-pay

## 2019-10-18 ENCOUNTER — Ambulatory Visit (INDEPENDENT_AMBULATORY_CARE_PROVIDER_SITE_OTHER): Payer: Medicaid Other | Admitting: Family Medicine

## 2019-10-18 ENCOUNTER — Encounter: Payer: Self-pay | Admitting: Family Medicine

## 2019-10-18 ENCOUNTER — Other Ambulatory Visit: Payer: Self-pay | Admitting: Family Medicine

## 2019-10-18 VITALS — BP 138/74 | HR 68 | Temp 97.8°F | Resp 16 | Ht 68.0 in | Wt 206.0 lb

## 2019-10-18 DIAGNOSIS — Z1211 Encounter for screening for malignant neoplasm of colon: Secondary | ICD-10-CM

## 2019-10-18 DIAGNOSIS — I10 Essential (primary) hypertension: Secondary | ICD-10-CM

## 2019-10-18 DIAGNOSIS — L98499 Non-pressure chronic ulcer of skin of other sites with unspecified severity: Secondary | ICD-10-CM

## 2019-10-18 DIAGNOSIS — I5022 Chronic systolic (congestive) heart failure: Secondary | ICD-10-CM | POA: Diagnosis not present

## 2019-10-18 DIAGNOSIS — Z89411 Acquired absence of right great toe: Secondary | ICD-10-CM | POA: Diagnosis not present

## 2019-10-18 DIAGNOSIS — E1142 Type 2 diabetes mellitus with diabetic polyneuropathy: Secondary | ICD-10-CM | POA: Diagnosis not present

## 2019-10-18 DIAGNOSIS — E11622 Type 2 diabetes mellitus with other skin ulcer: Secondary | ICD-10-CM | POA: Diagnosis not present

## 2019-10-18 DIAGNOSIS — E785 Hyperlipidemia, unspecified: Secondary | ICD-10-CM

## 2019-10-18 DIAGNOSIS — R351 Nocturia: Secondary | ICD-10-CM

## 2019-10-18 DIAGNOSIS — E1169 Type 2 diabetes mellitus with other specified complication: Secondary | ICD-10-CM

## 2019-10-18 DIAGNOSIS — N1831 Chronic kidney disease, stage 3a: Secondary | ICD-10-CM

## 2019-10-18 DIAGNOSIS — S98131D Complete traumatic amputation of one right lesser toe, subsequent encounter: Secondary | ICD-10-CM

## 2019-10-18 LAB — POCT GLYCOSYLATED HEMOGLOBIN (HGB A1C): Hemoglobin A1C: 9 % — AB (ref 4.0–5.6)

## 2019-10-18 NOTE — Assessment & Plan Note (Signed)
R Great toe S/p amputation 08/2018 Followed by [facility]

## 2019-10-18 NOTE — Patient Instructions (Addendum)
Thank you for coming to the office today.  Recent Labs    12/01/18 0836 04/01/19 0000 10/18/19 0849  HGBA1C 8.0* 7.7 9.0*   If average fasting CBG in morning is averaging >170 then you can increase up to by 1-2 units on Lantus, and continue at that dose for a while. May need 34 to 35 units soon if elevated sugars. If 4 or 5 out of 7 readings in morning are higher can proceed with this increased dose.  Reminder to call and schedule Diabetic Eye Exam   [facility] [address], [place], [place] [address] Phone: [phone] [email or web address]  DUE for FASTING BLOOD WORK (no food or drink after midnight before the lab appointment, only water or coffee without cream/sugar on the morning of)  SCHEDULE "Lab Only" visit in the morning at the clinic for lab draw in 4 MONTHS   - Make sure Lab Only appointment is at about 1 week before your next appointment, so that results will be available  For Lab Results, once available within 2-3 days of blood draw, you can can log in to MyChart online to view your results and a brief explanation. Also, we can discuss results at next follow-up visit.   Please schedule a Follow-up Appointment to: Return in about 4 months (around 02/17/2020) for 4 month follow-up DM, HTN, HLD lab results.  If you have any other questions or concerns, please feel free to call the office or send a message through [place]. You may also schedule an earlier appointment if necessary.  Additionally, you may be receiving a survey about your experience at our office within a few days to 1 week by e-mail or mail. We value your feedback.  [name], DO [place]

## 2019-10-18 NOTE — Progress Notes (Signed)
Subjective:    Patient ID: [name], male    DOB: [birth year], 58 y.o.   MRN: [id number]  [name] is a 59 y.o. male presenting on 10/18/2019 for Diabetes   HPI  CHRONIC DM, Type 2 with DM Neuropathy/ CKD-III Overweight BMI >29 Prior visits has had improving A1c - Today patient reportsdoing well CBG: Avg140-160, Low>100, High<250. Checks fasting AM most days Meds: - Victoza 1.8mg  inj daily - Lantus 30udaily - OFFMetformin due to CKD. Failed Bydureon in past due to nausea Reports good compliance. Tolerating well w/o side-effects Currently on ARB Lifestyle: - Diet (Still improving DM diet - admit not always adhering) - Exercise (Limited regular exericse) UTD DM Eye Exam 2020 - due for 2021 -  Eye -Admits intermittent burning, tingling at times in feet, worse at night Denies hypoglycemia, polyuria, visual changes  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 Followed by [facility] Podiatry / ID - had been managed with I&D after amputation, and prolonged antibiotic oral course Doxycycline/Augmentin in past. - Currently he is doing well - He has had nail treatments from podiatry - He is functioning well with R foot having 3 toes, s/p amputations now.  Chronic Systolic CHF / ischemic cardiomyopathy Followed by [facility] Cardiology Last ECHO 40-45% He continues on med management with BB, ARB, Hydralazine, Imdur, Lasix He has not had any significant changes or worsening.  Health Maintenance: Declines Pneumonia vaccine as diabetic.  Colon CA Screening: Never had colonoscopy or other screening. Currently asymptomatic. No known family history of colon CA. Due for screening test - proceed w Cologuard   Depression screen [facility] 2/9 10/18/2019 12/02/2018 08/31/2018  Decreased Interest 0 0 0  Down, Depressed, Hopeless 0 0 0  PHQ - 2 Score 0 0 0    Social History   Tobacco Use  .  Smoking status: Never Smoker  . Smokeless tobacco: Never Used  Substance Use Topics  . Alcohol use: Not Currently    Comment: quit drinking x 6 mths ago  . Drug use: No    Review of Systems Per HPI unless specifically indicated above     Objective:    BP 138/74 (BP Location: Left Arm, Cuff Size: Normal)   Pulse 68   Temp 97.8 F (36.6 C) (Temporal)   Resp 16   Ht 5\' 8"  (1.727 m)   Wt 206 lb (93.4 kg)   BMI 31.32 kg/m   Wt Readings from Last 3 Encounters:  10/18/19 206 lb (93.4 kg)  06/18/19 193 lb 3.2 oz (87.6 kg)  05/20/19 190 lb (86.2 kg)    Physical Exam Vitals and nursing note reviewed.  Constitutional:      General: He is not in acute distress.    Appearance: He is well-developed. He is not diaphoretic.     Comments: Well-appearing, comfortable, cooperative  HENT:     Head: Normocephalic and atraumatic.  Eyes:     General:        Right eye: No discharge.        Left eye: No discharge.     Conjunctiva/sclera: Conjunctivae normal.  Neck:     Thyroid: No thyromegaly.  Cardiovascular:     Rate and Rhythm: Normal rate and regular rhythm.     Heart sounds: Normal heart sounds. No murmur.  Pulmonary:     Effort: Pulmonary effort is normal. No respiratory distress.     Breath sounds:  Normal breath sounds. No wheezing or rales.  Musculoskeletal:        General: Normal range of motion.     Cervical back: Normal range of motion and neck supple.     Comments: S/p R Great Toe Amputation S/p R 5th Little Toe Amputation  Lymphadenopathy:     Cervical: No cervical adenopathy.  Skin:    General: Skin is warm and dry.     Findings: No erythema or rash.  Neurological:     Mental Status: He is alert and oriented to person, place, and time.  Psychiatric:        Behavior: Behavior normal.     Comments: Well groomed, good eye contact, normal speech and thoughts      Recent Labs    12/01/18 0836 04/01/19 0000 10/18/19 0849  HGBA1C 8.0* 7.7 9.0*     Results for  orders placed or performed in visit on 10/18/19  POCT HgB A1C  Result Value Ref Range   Hemoglobin A1C 9.0 (A) 4.0 - 5.6 %      Assessment & Plan:   Problem List Items Addressed This Visit    Right great toe amputee ([place])    R Great toe S/p amputation 08/2018 Followed by [facility] Podiatry      DM type 2 with diabetic peripheral neuropathy ([place]) - Primary    Worsening elevated A1c from 7.7 up to 9.0 now improved med adherence but decline in lifestyle adherence R toe x 2 amputation w/ history of chronic ulcer/osteo No hypoglycemia Complications - Hyperglycemia, CKD-III, peripheral neuropathy, DM retinopathy, DM ulceration Meds - contraindicated Metformin with CKD / Failed Bydureon BCise x 2 trials (nausea)  Plan:  1.  Continue Lantus 30u daily, Victoza 1.8 u daily - dosing instructions to increase Lantus up from 30u based on fasting CBG if >170 on avg can increase 1-2 unit per week 2. Encourage improved lifestyle - low carb, low sugar diet, reduce portion size, continue improving regular exercise 3. Check CBG, bring log to next visit for review 4. Continue ASA, ARB, Statin      Relevant Orders   POCT HgB A1C (Completed)   DM type 2 with diabetic chronic skin ulcer ([place])    S/p prior amputation of toes Followed by Podiatry / previously ID      Chronic systolic heart failure (HCC)    Clinically stable, euvolemic today CHF with history of reduced EF improved to 40-45% in 2019 Last seen Cardiology [facility] 2505 Complication with CKD-III-IV  Plan Continue current med management per Cardiology Reviewed diet avoid high sodium foods      Amputation of little toe (HCC)    S/p amputation R little toe 5th digit  Followed by [facility] Podiatry       Other Visit Diagnoses    Screen for colon cancer       Relevant Orders   Cologuard      No orders of the defined types were placed in this encounter.  Due for routine colon cancer screening. Never had colonoscopy (not interested), no  family history colon cancer. - Discussion today about recommendations for either Colonoscopy or Cologuard screening, benefits and risks of screening, interested in Cologuard, understands that if positive then recommendation is for diagnostic colonoscopy to follow-up. - Ordered Cologuard today  Follow up plan: Return in about 4 months (around 02/17/2020) for 4 month follow-up DM, HTN, HLD lab results.  Future labs ordered for 02/2020   [name], [facility] 10/18/2019, 8:46 AM

## 2019-10-18 NOTE — Assessment & Plan Note (Addendum)
S/p amputation R little toe 5th digit  Followed by [facility]

## 2019-10-18 NOTE — Assessment & Plan Note (Signed)
S/p prior amputation of toes Followed by Podiatry / previously ID

## 2019-10-18 NOTE — Assessment & Plan Note (Signed)
Worsening elevated A1c from 7.7 up to 9.0 now improved med adherence but decline in lifestyle adherence R toe x 2 amputation w/ history of chronic ulcer/osteo No hypoglycemia Complications - Hyperglycemia, CKD-III, peripheral neuropathy, DM retinopathy, DM ulceration Meds - contraindicated Metformin with CKD / Failed Bydureon BCise x 2 trials (nausea)  Plan:  1.  Continue Lantus 30u daily, Victoza 1.8 u daily - dosing instructions to increase Lantus up from 30u based on fasting CBG if >170 on avg can increase 1-2 unit per week 2. Encourage improved lifestyle - low carb, low sugar diet, reduce portion size, continue improving regular exercise 3. Check CBG, bring log to next visit for review 4. Continue ASA, ARB, Statin

## 2019-10-18 NOTE — Assessment & Plan Note (Addendum)
Clinically stable, euvolemic today CHF with history of reduced EF improved to 40-45% in 2019 Last seen Cardiology [facility] 2574 Complication with CKD-III-IV  Plan Continue current med management per Cardiology Reviewed diet avoid high sodium foods

## 2019-11-18 DIAGNOSIS — Z1212 Encounter for screening for malignant neoplasm of rectum: Secondary | ICD-10-CM | POA: Diagnosis not present

## 2019-11-18 DIAGNOSIS — Z1211 Encounter for screening for malignant neoplasm of colon: Secondary | ICD-10-CM | POA: Diagnosis not present

## 2019-11-20 LAB — COLOGUARD: Cologuard: POSITIVE — AB

## 2019-11-25 ENCOUNTER — Telehealth: Payer: Self-pay | Admitting: Family Medicine

## 2019-11-25 DIAGNOSIS — R195 Other fecal abnormalities: Secondary | ICD-10-CM

## 2019-11-25 NOTE — Telephone Encounter (Signed)
Attempted to call patient about cologuard result.  Cologuard 11/20/19 = POSITIVE (abnormal) result.  He has not had prior colonoscopy before.  I left a voicemail for patient to call back for test result.  If you can reach him or if he calls back please share the following:  Most of the time we get a positive Cologuard result, usually it is due to a Polyp that is either benign or possibly pre-cancer. If it is caught early it can be treated.  The next step is a Colonoscopy procedure at the hospital.  I will go ahead and place order for him to see the GI specialist  They can setup and schedule the procedure - they should call with his apt  [facility]) [place] [place], [place] [address] Phone: [phone]  [name], [facility] 11/25/2019, 1:17 PM

## 2019-11-25 NOTE — Telephone Encounter (Addendum)
Left message for patient to call back also CRM message created.

## 2019-11-26 DIAGNOSIS — M7989 Other specified soft tissue disorders: Secondary | ICD-10-CM | POA: Diagnosis not present

## 2019-11-26 DIAGNOSIS — M79674 Pain in right toe(s): Secondary | ICD-10-CM | POA: Diagnosis not present

## 2019-11-27 IMAGING — DX DG TOE GREAT 2+V*R*
3 series · 3 of 3 positions shown · non-contrast
Comparison: None.

CLINICAL DATA: Great toe pain and swelling

EXAM:
RIGHT GREAT TOE

[toe ap]
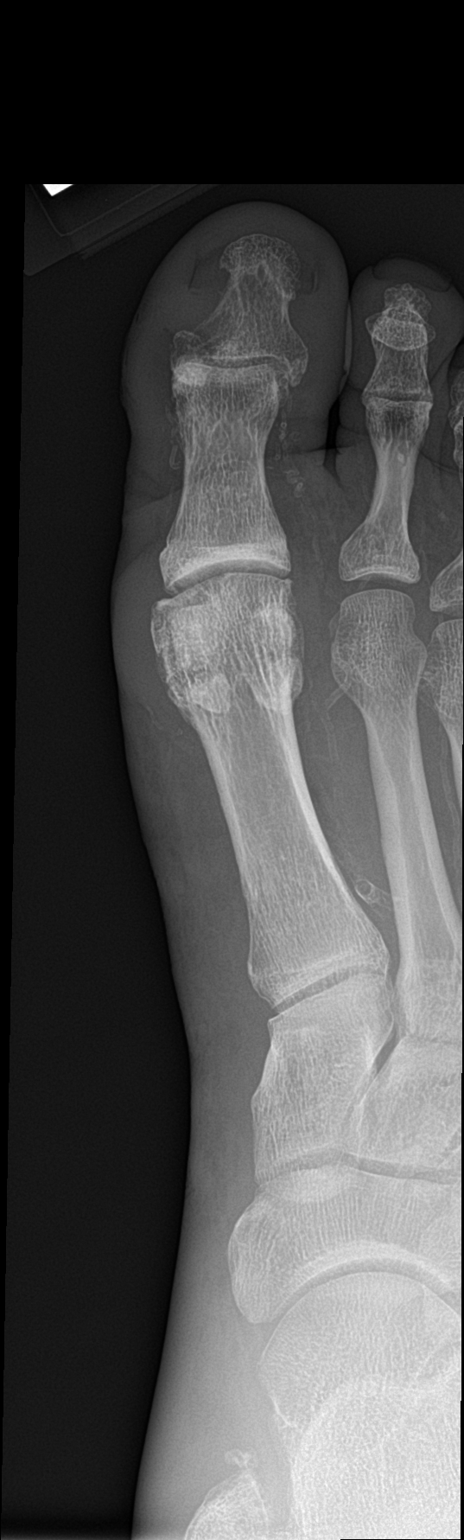

[toe obl]
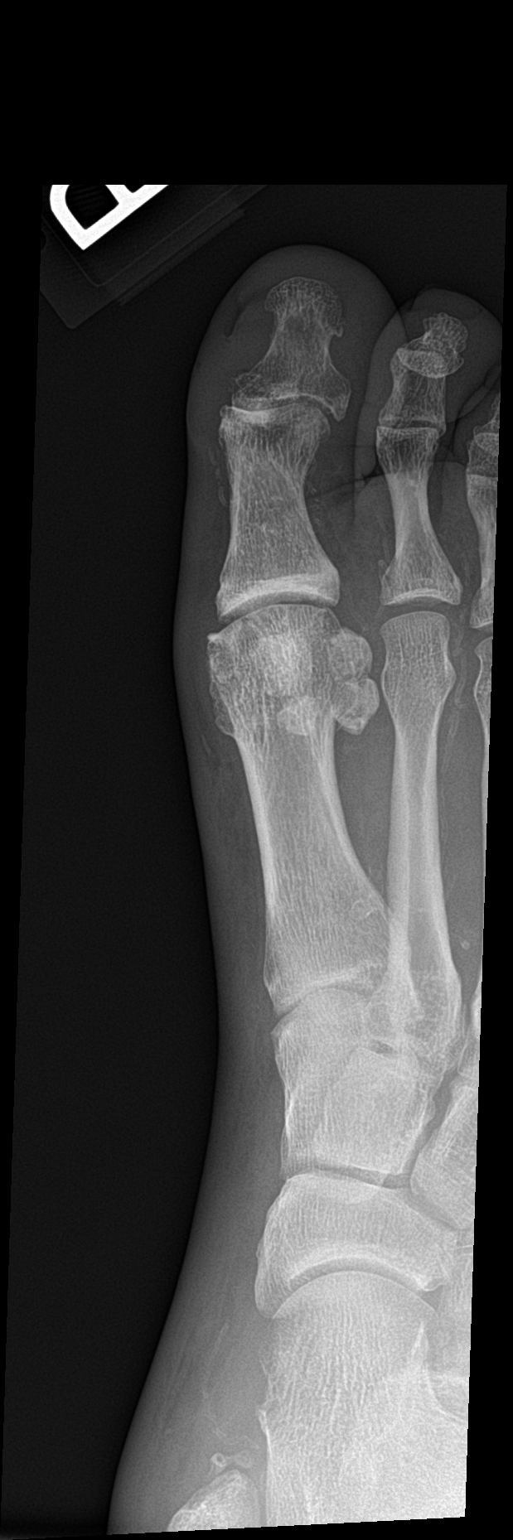

[toe lat]
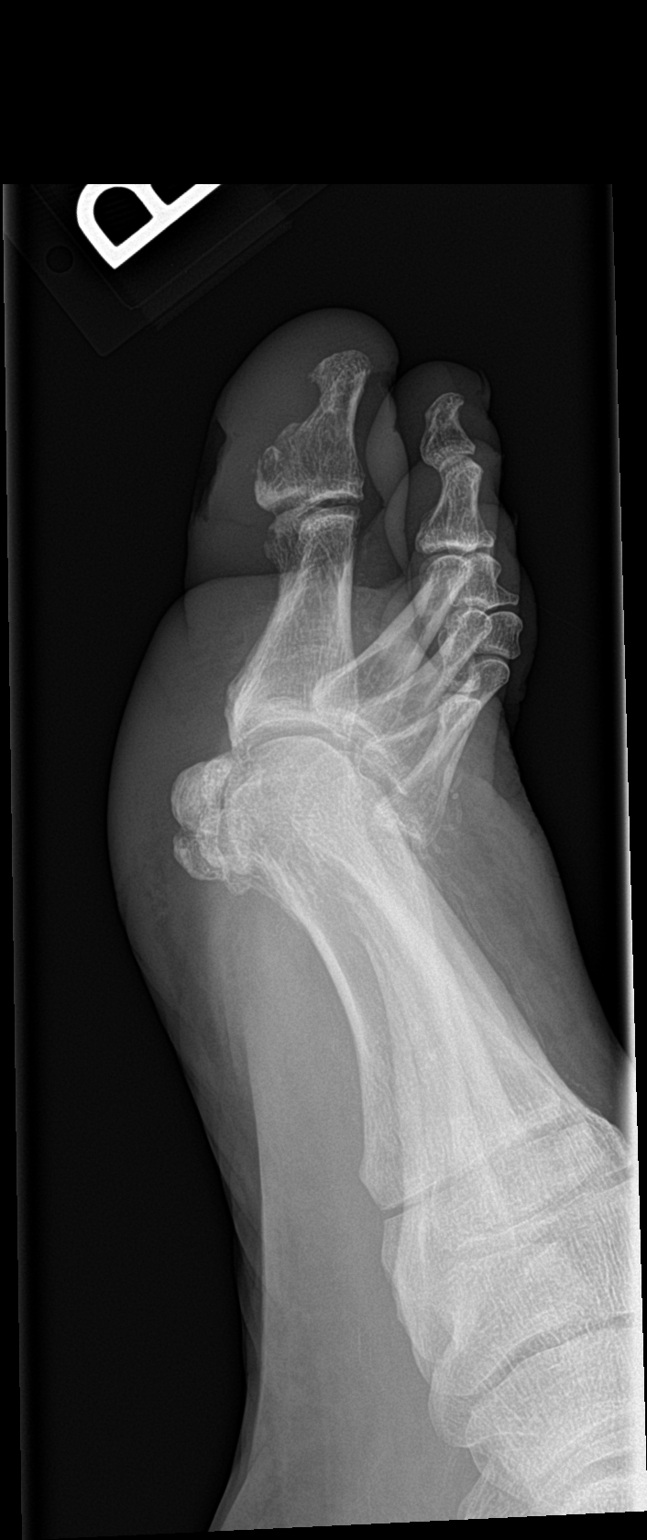

[3 of 3 positions shown; findings below may reference images not displayed]

FINDINGS: No acute fracture or dislocation. No aggressive osseous lesion. Mild
osteoarthritis of the first TMT joint, MTP joint and first IP joint.
Soft tissue ulcer along the plantar aspect of the first distal
phalanx. No periosteal reaction or bone destruction.
IMPRESSION: No osteomyelitis of the right great toe.

## 2019-11-29 NOTE — Telephone Encounter (Signed)
Patient's spouse notified

## 2020-01-04 ENCOUNTER — Other Ambulatory Visit: Payer: Self-pay | Admitting: Cardiovascular Disease

## 2020-01-05 NOTE — Telephone Encounter (Signed)
Scheduled for 6-25

## 2020-01-05 NOTE — Telephone Encounter (Signed)
Please schedule overdue 4 month F/U appointment. Thank you!

## 2020-01-07 ENCOUNTER — Other Ambulatory Visit: Payer: Self-pay

## 2020-01-07 ENCOUNTER — Encounter: Payer: Self-pay | Admitting: Family

## 2020-01-07 ENCOUNTER — Ambulatory Visit (INDEPENDENT_AMBULATORY_CARE_PROVIDER_SITE_OTHER): Payer: Medicaid Other | Admitting: Family

## 2020-01-07 ENCOUNTER — Telehealth: Payer: Self-pay

## 2020-01-07 VITALS — BP 140/80 | HR 60 | Ht 68.0 in | Wt 201.0 lb

## 2020-01-07 DIAGNOSIS — E785 Hyperlipidemia, unspecified: Secondary | ICD-10-CM | POA: Diagnosis not present

## 2020-01-07 DIAGNOSIS — I5022 Chronic systolic (congestive) heart failure: Secondary | ICD-10-CM | POA: Diagnosis not present

## 2020-01-07 DIAGNOSIS — I10 Essential (primary) hypertension: Secondary | ICD-10-CM

## 2020-01-07 DIAGNOSIS — E1169 Type 2 diabetes mellitus with other specified complication: Secondary | ICD-10-CM

## 2020-01-07 DIAGNOSIS — I25118 Atherosclerotic heart disease of native coronary artery with other forms of angina pectoris: Secondary | ICD-10-CM

## 2020-01-07 MED ORDER — ATORVASTATIN CALCIUM 40 MG PO TABS: 40.0000 mg | ORAL_TABLET | Freq: Every day | ORAL | 3 refills | Status: DC

## 2020-01-07 MED ORDER — FUROSEMIDE 20 MG PO TABS: 10.0000 mg | ORAL_TABLET | Freq: Every day | ORAL | 3 refills | Status: DC

## 2020-01-07 MED ORDER — TELMISARTAN 40 MG PO TABS: 40.0000 mg | ORAL_TABLET | Freq: Every day | ORAL | 3 refills | Status: DC

## 2020-01-07 NOTE — Telephone Encounter (Signed)
PA completed in [email or web address] for telmisartan. KEY: [id number] Response: The plan will fax you a determination, typically within 1 to 5 business days.

## 2020-01-07 NOTE — Progress Notes (Signed)
Office Visit    Patient Name: [name] Date of Encounter: 01/07/2020  Primary Care Provider:  Olin Hauser, DO Primary Cardiologist:  [name], MD Electrophysiologist:  None   Chief Complaint    [name] is a 59 y.o. male with a hx of CAD s/p DES to LAD 10/2016, alcohol use, mixed ischemic and nonischemic cardiomyopathy, DM2, HTN, HLD, CKD 3 presents today for follow-up of CAD, HTN, cardiomyopathy  Past Medical History    Past Medical History:  Diagnosis Date  . Ankle pain   . Chronic combined systolic (congestive) and diastolic (congestive) heart failure ([place]) 10/2016   a. 10/2016 Echo: EF 25-30%, diff HK, mild MR; b. 01/2017 Echo:  EF 40-45%, moderate LVH, Gr1DD, calcified mitral annulus, mildly dilated left atrium, RV cavity size was normal with normal RVSF; c. 08/2017 Echo: EF 40-45%, sept, antsept HK, Gr1DD, Mild MR, mildly dil LA. Nl RV fxn.  . CKD (chronic kidney disease), stage III   . Coronary artery disease    a. 10/2016 Cath/PCI: LAD 8m(3.5x15 Xience Alpine DES). No other obstructive disease.  . Diabetic neuropathy ([place]   . Hyperlipidemia   . Hypertension   . 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 10/2016 Echo: EF 25-30%, diff HK; b. 01/2017 Echo: EF 40-45%; c. 08/2017 Echo: EF 40-45%.  . Pain in both feet    Past Surgical History:  Procedure Laterality Date  . CARDIAC CATHETERIZATION    . CORONARY STENT INTERVENTION N/A 10/21/2016   Procedure: Coronary Stent Intervention;  Surgeon: MWellington Hampshire MD;  [facility];  Service: Cardiovascular;  Laterality: N/A;  . LEFT HEART CATH AND CORONARY ANGIOGRAPHY N/A 10/21/2016   Procedure: Left Heart Cath and Coronary Angiography;  Surgeon: MWellington Hampshire MD;  [facility];  Service: Cardiovascular;  Laterality: N/A;  . TIBIA FRACTURE SURGERY Right 2002    Allergies  Allergies  Allergen Reactions  .  Sulfamethoxazole-Trimethoprim Other (See Comments)    AKI   . Lisinopril     Hyperkalemia   . Terbinafine And Related Rash    History of Present Illness    Harold SCIALDONEis a 59y.o. male with a hx of  CAD s/p DES to LAD 10/2016, alcohol use, mixed ischemic and nonischemic cardiomyopathy, DM2, HTN, HLD, CKD 3.  He was last seen 05/20/2019 via telemedicine by Dr. AFletcher Anon  CAD s/p DES to LAD April 2018.  At that time noted severe LV dysfunction with EF 25 to 30% diffuse hypokinesis which was felt to be out of proportion to the LAD disease.  He has no history of alcohol use.  Follow-up echo July 2018 showed LV EF 40-45%.  Most recent echo 2/19 LVEF 40 to 45% with grade 1 diastolic dysfunction.  His amlodipine has previously been discontinued due to leg edema.  [facility]  He also follows with vascular surgery.  He has had toe amputations done.  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  His gabapentin was switched to Lyrica.  He reported it did not improve his pain and he transitioned back to his gabapentin.  Reports his leg pain occurs all the time.  Reports leg pain does not worsen with walking.  Enjoys working in his garden.  He has been out of his telmisartan for some time.  Unclear why he stopped taking.  Likely ran out and did not request refills.  Reports no shortness of breath nor dyspnea on exertion. Reports no chest pain, pressure, or tightness. No edema, orthopnea, PND. Reports no palpitations.   He also had stopped taking aspirin as he simply not picked up a new bottle.  He was educated on the importance of aspirin in the setting of his previous stent.  He was agreeable to continue.  EKGs/Labs/Other Studies Reviewed:   The following studies were reviewed today:  EKG:  EKG is  ordered today.  The ekg  ordered today demonstrates NSR 60 bpm with left axis deviation and LBBB  Recent Labs: No results found for requested labs within last 8760 hours.  Recent Lipid Panel    Component Value Date/Time   CHOL 126 03/17/2018 1601   TRIG 308 (H) 03/17/2018 1601   HDL 35 (L) 03/17/2018 1601   CHOLHDL 3.6 03/17/2018 1601   CHOLHDL 3.4 10/20/2016 0515   VLDL 34 10/20/2016 0515   LDLCALC 29 03/17/2018 1601   LDLDIRECT 52 03/17/2018 1601    Home Medications   Current Meds  Medication Sig  . acetaminophen (TYLENOL 8 HOUR) 650 MG CR tablet Take 1 tablet (650 mg total) by mouth every 8 (eight) hours as needed for pain.  [name] atorvastatin (LIPITOR) 40 MG tablet Take 1 tablet (40 mg total) by mouth at bedtime.  [name] b complex vitamins tablet Take 1 tablet by mouth daily.  . blood glucose meter kit and supplies KIT Dispense brand based on patient and insurance preference. Use up to 3 times daily to check blood sugar. DX: ICD 10 E11.9 Diabetes Type 2.  . Blood Pressure Monitoring (BLOOD PRESSURE CUFF) MISC 1 each by Does not apply route daily.  . carvedilol (COREG) 25 MG tablet TAKE 1 TABLET (25 MG TOTAL) BY MOUTH 2 (TWO) TIMES DAILY WITH A MEAL.  . furosemide (LASIX) 20 MG tablet TAKE 1/2 TABLET BY MOUTH DAILY  . gabapentin (NEURONTIN) 400 MG capsule TAKE 2-3 CAPSULES BY MOUTH 3 (THREE) TIMES DAILY.  . hydrALAZINE (APRESOLINE) 100 MG tablet Take 1 tablet (100 mg total) by mouth 3 (three) times daily.  . insulin glargine (LANTUS SOLOSTAR) 100 UNIT/ML Solostar Pen Inject 30 Units into the skin at bedtime.  . Insulin Pen Needle (B-D ULTRAFINE III SHORT PEN) 31G X 8 MM MISC USE TO INJECT INSULIN AND VICTOZA, 2 NEEDLES PER DAY  . isosorbide mononitrate (IMDUR) 60 MG 24 hr tablet TAKE 1.5 TABLETS (90 MG TOTAL) BY MOUTH DAILY.  [name] Lancets Misc. MISC Use  Brand compatable to insurance and monitor to check blood sugar up to 3 times daily. ICD10 E11.9  . VICTOZA 18 MG/3ML SOPN INJECT 0.3 MLS INTO THE SKIN DAILY. START  WITH 1.2MG DAILY FOR 4 WEEKS THEN INCREASE TO 1.8      Review of Systems   Review of Systems  Constitutional: Negative for chills, fever and malaise/fatigue.  Cardiovascular: Negative for chest pain, dyspnea on exertion, leg swelling, near-syncope, orthopnea, palpitations and syncope.  Respiratory: Negative for cough, shortness of breath and wheezing.   Gastrointestinal: Negative for nausea and vomiting.  Neurological: Negative for dizziness, light-headedness and weakness.   All other systems reviewed and are otherwise negative except as noted above.  Physical Exam    VS:  BP 140/80 (BP Location: Left Arm, Patient Position: Sitting, Cuff Size: Normal)   Pulse 60   Ht  5' 8"  (1.727 m)   Wt 201 lb (91.2 kg)   SpO2 97%   BMI 30.56 kg/m  , BMI Body mass index is 30.56 kg/m. GEN: Well nourished, well developed, in no acute distress. HEENT: normal. Neck: Supple, no JVD, carotid bruits, or masses. Cardiac: RRR, no murmurs, rubs, or gallops. No clubbing, cyanosis, edema.  Radials/DP/PT 2+ and equal bilaterally.  Respiratory:  Respirations regular and unlabored, clear to auscultation bilaterally. GI: Soft, nontender, nondistended, BS + x 4. MS: No deformity or atrophy. Skin: Warm and dry, no rash. Neuro:  Strength and sensation are intact. Psych: Normal affect.   Assessment & Plan    1. CAD -s/p LAD stenting April 2018.  No anginal symptoms.  No indication for repeat ischemic evaluation at this time.  Continue GDMT including aspirin, beta-blocker, statin, Imdur.  Aspirin samples provided in the office today.  2. ICM/HFrEF - Most recent EF 40-45%.  Euvolemic, constant on exam.  Continue beta-blocker, ARB, hydralazine, Imdur, Lasix.  3. HTN -BP elevated but he has been out of his telmisartan for some time.  We will resume at reduced dose of telmisartan 40 mg daily.  He has previously been on amlodipine, hydrochlorothiazide, losartan, lisinopril for blood pressure all with suboptimal  control and multiple side effects.  His blood pressure has previously been well controlled on the following regimen and thus we will continue: Coreg 25 mg twice daily, Lasix 10 mg daily, hydralazine 100 mg 3 times daily, Imdur 90 mg daily, telmisartan 40 mg daily.  4. HLD - Continue atorvastatin.  He has upcoming labs with PCP and lipid panel will be collected at that time.  5. DM2 -continue to follow with PCP.  If additional agent needed consider SGLT2 I for cardioprotective benefit.  6. CKD 3 -careful titration of antihypertensives and diuretics.  He has upcoming labs with primary care and renal function will be reassessed at that time.  Disposition: Follow up in 6 month(s) with Dr. Fletcher Anon or APP   Loel Dubonnet, NP 01/07/2020, 10:51 AM

## 2020-01-07 NOTE — Patient Instructions (Signed)
Medication Instructions:   Resume taking your Telmisartan: Take 1 tablet (40 mg total) by mouth daily    *If you need a refill on your cardiac medications before your next appointment, please call your pharmacy*   Lab Work: None Ordered If you have labs (blood work) drawn today and your tests are completely normal, you will receive your results only by: [name] MyChart Message (if you have MyChart) OR . A paper copy in the mail If you have any lab test that is abnormal or we need to change your treatment, we will call you to review the results.   Testing/Procedures: None Ordered   Follow-Up: At [facility], you and your health needs are our priority.  As part of our continuing mission to provide you with exceptional heart care, we have created designated Provider Care Teams.  These Care Teams include your primary Cardiologist (physician) and Advanced Practice Providers (APPs -  Physician Assistants and Nurse Practitioners) who all work together to provide you with the care you need, when you need it.  We recommend signing up for the patient portal called "MyChart".  Sign up information is provided on this After Visit Summary.  MyChart is used to connect with patients for Virtual Visits (Telemedicine).  Patients are able to view lab/test results, encounter notes, upcoming appointments, etc.  Non-urgent messages can be sent to your provider as well.   To learn more about what you can do with MyChart, go to [email or web address].    Your next appointment:   6 month(s)  The format for your next appointment:   In Person  Provider:   [name], MD   Other Instructions N/A

## 2020-01-13 NOTE — Telephone Encounter (Signed)
Per Shawn at [facility], telmisartan has been approved and patient's copay will be $3. Spoke with patient's wife Butch Penny and informed her [facility]

## 2020-01-18 ENCOUNTER — Other Ambulatory Visit: Payer: Self-pay | Admitting: Family Medicine

## 2020-01-18 DIAGNOSIS — E1142 Type 2 diabetes mellitus with diabetic polyneuropathy: Secondary | ICD-10-CM

## 2020-01-18 NOTE — Telephone Encounter (Signed)
Requested medication (s) are due for refill today: Yes  Requested medication (s) are on the active medication list: Yes  Last refill:  01/09/19  Future visit scheduled: Yes  Notes to clinic:  Prescription has expired.    Requested Prescriptions  Pending Prescriptions Disp Refills   LANTUS SOLOSTAR 100 UNIT/ML Solostar Pen [Pharmacy Med Name: LANTUS SOLOSTAR 100 UNIT/ML] 15 mL 5    Sig: Inject 40 Units into the skin at bedtime.      Endocrinology:  Diabetes - Insulins Failed - 01/18/2020 10:06 AM      Failed - HBA1C is between 0 and 7.9 and within 180 days    Hemoglobin A1C  Date Value Ref Range Status  10/18/2019 9.0 (A) 4.0 - 5.6 % Final  04/01/2019 7.7  Final    Comment:    [facility]          Passed - Valid encounter within last 6 months    Recent Outpatient Visits           3 months ago DM type 2 with diabetic peripheral neuropathy ([place])   [place], DO   7 months ago DM type 2 with diabetic peripheral neuropathy [facility])   [facility] Olin Hauser, DO   1 year ago DM type 2 with diabetic peripheral neuropathy [facility])   [facility] Olin Hauser, DO   1 year ago DM type 2 with diabetic peripheral neuropathy [facility])   [facility] Olin Hauser, DO   1 year ago DM type 2 with diabetic peripheral neuropathy [facility])   [facility] Parks Ranger, Devonne Doughty, DO       Future Appointments             In 1 month Parks Ranger, Devonne Doughty, [facility], [place]   In 5 months Fletcher Anon, Mertie Clause, MD [facility], [place]

## 2020-02-14 ENCOUNTER — Other Ambulatory Visit: Payer: Medicaid Other

## 2020-02-14 ENCOUNTER — Other Ambulatory Visit: Payer: Self-pay

## 2020-02-14 DIAGNOSIS — N1831 Chronic kidney disease, stage 3a: Secondary | ICD-10-CM

## 2020-02-14 DIAGNOSIS — E1142 Type 2 diabetes mellitus with diabetic polyneuropathy: Secondary | ICD-10-CM

## 2020-02-14 DIAGNOSIS — I10 Essential (primary) hypertension: Secondary | ICD-10-CM

## 2020-02-14 DIAGNOSIS — I5022 Chronic systolic (congestive) heart failure: Secondary | ICD-10-CM

## 2020-02-14 DIAGNOSIS — R351 Nocturia: Secondary | ICD-10-CM

## 2020-02-14 DIAGNOSIS — E785 Hyperlipidemia, unspecified: Secondary | ICD-10-CM

## 2020-02-15 DIAGNOSIS — N1831 Chronic kidney disease, stage 3a: Secondary | ICD-10-CM | POA: Diagnosis not present

## 2020-02-15 DIAGNOSIS — E1142 Type 2 diabetes mellitus with diabetic polyneuropathy: Secondary | ICD-10-CM | POA: Diagnosis not present

## 2020-02-15 DIAGNOSIS — I10 Essential (primary) hypertension: Secondary | ICD-10-CM | POA: Diagnosis not present

## 2020-02-15 DIAGNOSIS — I5022 Chronic systolic (congestive) heart failure: Secondary | ICD-10-CM | POA: Diagnosis not present

## 2020-02-15 DIAGNOSIS — E785 Hyperlipidemia, unspecified: Secondary | ICD-10-CM | POA: Diagnosis not present

## 2020-02-15 DIAGNOSIS — R351 Nocturia: Secondary | ICD-10-CM | POA: Diagnosis not present

## 2020-02-15 DIAGNOSIS — E1169 Type 2 diabetes mellitus with other specified complication: Secondary | ICD-10-CM | POA: Diagnosis not present

## 2020-02-16 LAB — COMPLETE METABOLIC PANEL WITH GFR
AG Ratio: 1.5 (calc) (ref 1.0–2.5)
ALT: 23 U/L (ref 9–46)
AST: 22 U/L (ref 10–35)
Albumin: 3.9 g/dL (ref 3.6–5.1)
Alkaline phosphatase (APISO): 58 U/L (ref 35–144)
BUN/Creatinine Ratio: 14 (calc) (ref 6–22)
BUN: 39 mg/dL — ABNORMAL HIGH (ref 7–25)
CO2: 29 mmol/L (ref 20–32)
Calcium: 9 mg/dL (ref 8.6–10.3)
Chloride: 100 mmol/L (ref 98–110)
Creat: 2.7 mg/dL — ABNORMAL HIGH (ref 0.70–1.25)
GFR, Est African American: 28 mL/min/{1.73_m2} — ABNORMAL LOW (ref >=60)
GFR, Est Non African American: 25 mL/min/{1.73_m2} — ABNORMAL LOW (ref >=60)
Globulin: 2.6 g/dL (calc) (ref 1.9–3.7)
Glucose, Bld: 195 mg/dL — ABNORMAL HIGH (ref 65–99)
Potassium: 5 mmol/L (ref 3.5–5.3)
Sodium: 136 mmol/L (ref 135–146)
Total Bilirubin: 0.6 mg/dL (ref 0.2–1.2)
Total Protein: 6.5 g/dL (ref 6.1–8.1)

## 2020-02-16 LAB — CBC WITH DIFFERENTIAL/PLATELET
Absolute Monocytes: 592 cells/uL (ref 200–950)
Basophils Absolute: 28 cells/uL (ref 0–200)
Basophils Relative: 0.6 %
Eosinophils Absolute: 141 cells/uL (ref 15–500)
Eosinophils Relative: 3 %
HCT: 43.8 % (ref 38.5–50.0)
Hemoglobin: 14.6 g/dL (ref 13.2–17.1)
Lymphs Abs: 1622 cells/uL (ref 850–3900)
MCH: 33 pg (ref 27.0–33.0)
MCHC: 33.3 g/dL (ref 32.0–36.0)
MCV: 99.1 fL (ref 80.0–100.0)
MPV: 10.2 fL (ref 7.5–12.5)
Monocytes Relative: 12.6 %
Neutro Abs: 2317 cells/uL (ref 1500–7800)
Neutrophils Relative %: 49.3 %
Platelets: 171 10*3/uL (ref 140–400)
RBC: 4.42 10*6/uL (ref 4.20–5.80)
RDW: 12.4 % (ref 11.0–15.0)
Total Lymphocyte: 34.5 %
WBC: 4.7 10*3/uL (ref 3.8–10.8)

## 2020-02-16 LAB — HEMOGLOBIN A1C
Hgb A1c MFr Bld: 8.4 % of total Hgb — ABNORMAL HIGH (ref <5.7)
Mean Plasma Glucose: 194 (calc)
eAG (mmol/L): 10.8 (calc)

## 2020-02-16 LAB — LIPID PANEL
Cholesterol: 153 mg/dL (ref <200)
HDL: 45 mg/dL (ref >=40)
LDL Cholesterol (Calc): 77 mg/dL (calc)
Non-HDL Cholesterol (Calc): 108 mg/dL (calc) (ref <130)
Total CHOL/HDL Ratio: 3.4 (calc) (ref <5.0)
Triglycerides: 217 mg/dL — ABNORMAL HIGH (ref <150)

## 2020-02-16 LAB — PSA: PSA: 1.1 ng/mL (ref <=4.0)

## 2020-02-18 ENCOUNTER — Encounter: Payer: Self-pay | Admitting: Family Medicine

## 2020-02-18 ENCOUNTER — Ambulatory Visit (INDEPENDENT_AMBULATORY_CARE_PROVIDER_SITE_OTHER): Payer: Medicaid Other | Admitting: Family Medicine

## 2020-02-18 ENCOUNTER — Other Ambulatory Visit: Payer: Self-pay

## 2020-02-18 VITALS — BP 136/68 | HR 61 | Temp 97.5°F | Resp 16 | Ht 68.0 in | Wt 200.4 lb

## 2020-02-18 DIAGNOSIS — E1142 Type 2 diabetes mellitus with diabetic polyneuropathy: Secondary | ICD-10-CM | POA: Diagnosis not present

## 2020-02-18 DIAGNOSIS — E11622 Type 2 diabetes mellitus with other skin ulcer: Secondary | ICD-10-CM | POA: Diagnosis not present

## 2020-02-18 DIAGNOSIS — N184 Chronic kidney disease, stage 4 (severe): Secondary | ICD-10-CM

## 2020-02-18 DIAGNOSIS — I129 Hypertensive chronic kidney disease with stage 1 through stage 4 chronic kidney disease, or unspecified chronic kidney disease: Secondary | ICD-10-CM | POA: Diagnosis not present

## 2020-02-18 DIAGNOSIS — L98499 Non-pressure chronic ulcer of skin of other sites with unspecified severity: Secondary | ICD-10-CM | POA: Diagnosis not present

## 2020-02-18 DIAGNOSIS — E1169 Type 2 diabetes mellitus with other specified complication: Secondary | ICD-10-CM | POA: Diagnosis not present

## 2020-02-18 DIAGNOSIS — E785 Hyperlipidemia, unspecified: Secondary | ICD-10-CM

## 2020-02-18 DIAGNOSIS — E669 Obesity, unspecified: Secondary | ICD-10-CM

## 2020-02-18 DIAGNOSIS — E113213 Type 2 diabetes mellitus with mild nonproliferative diabetic retinopathy with macular edema, bilateral: Secondary | ICD-10-CM | POA: Diagnosis not present

## 2020-02-18 NOTE — Progress Notes (Signed)
Subjective:    Patient ID: [name], male    DOB: Feb 20, 1961, 59 y.o.   MRN: [id number]  [name] is a 59 y.o. male presenting on 02/18/2020 for Hypertension   HPI   CHRONIC DM, Type 2 with DM Neuropathy/ CKD-IV Obesity BMI >30 Hyperlipidemia Due for A1c - Today patient reportsdoing well CBG: Avg140-160, Low>100, High<250. Checks fasting AM most days Meds: - Lantus30udaily - OFF Victoza 1.8mg  inj daily due to preference, unsure if side effect. - OFFMetformin due to CKD. Failed Bydureon in past due to nausea Reports good compliance. Tolerating well w/o side-effects Currently on ARB On Atorvastatin 40mg  daily. Last labs reviewed Lifestyle: - Diet (Still improving DM diet - admit not always adhering) - Exercise (Limited regular exericse) UTD DM Eye Exam 2020 - due for 2021 - [facility] -Admits intermittent burning, tingling at times in feet, worse at night Denies hypoglycemia, polyuria, visual changes  Hypertension / CKD-IV Recent lab showed elevated Creatinine. Reduced GFR He remains off Metformin and off ARB He has been followed by [facility] Dr Holley Raring in past, has not been back recently.  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 Due COVID  Depression screen [facility] 2/9 02/18/2020 10/18/2019 12/02/2018  Decreased Interest 0 0 0  Down, Depressed, Hopeless 0 0 0  PHQ - 2 Score 0 0 0    Social History   Tobacco Use  . Smoking status: Never Smoker  . Smokeless tobacco: Never Used  Vaping Use  . Vaping Use: Never used  Substance Use Topics  . Alcohol use: Yes    Comment: occassionally  . Drug use: No    Review of Systems Per HPI unless specifically indicated above     Objective:    BP 136/68 (BP Location: Left Arm, Cuff Size: Normal)   Pulse 61   Temp (!) 97.5 F (36.4 C) (Temporal)   Resp 16   Ht 5\' 8"  (1.727 m)    Wt 200 lb 6.4 oz (90.9 kg)   SpO2 97%   BMI 30.47 kg/m   Wt Readings from Last 3 Encounters:  02/18/20 200 lb 6.4 oz (90.9 kg)  01/07/20 201 lb (91.2 kg)  10/18/19 206 lb (93.4 kg)    Physical Exam Vitals and nursing note reviewed.  Constitutional:      General: He is not in acute distress.    Appearance: He is well-developed. He is obese. He is not diaphoretic.     Comments: Well-appearing, comfortable, cooperative  HENT:     Head: Normocephalic and atraumatic.  Eyes:     General:        Right eye: No discharge.        Left eye: No discharge.     Conjunctiva/sclera: Conjunctivae normal.  Neck:     Thyroid: No thyromegaly.  Cardiovascular:     Rate and Rhythm: Normal rate and regular rhythm.     Heart sounds: Normal heart sounds. No murmur heard.   Pulmonary:     Effort: Pulmonary effort is normal. No respiratory distress.     Breath sounds: Normal breath sounds. No wheezing or rales.  Musculoskeletal:        General: Normal range of motion.     Cervical back: Normal range of motion and neck supple.     Comments: S/p R Great Toe Amputation S/p R 5th Little Toe Amputation  Lymphadenopathy:  Cervical: No cervical adenopathy.  Skin:    General: Skin is warm and dry.     Findings: No erythema or rash.  Neurological:     Mental Status: He is alert and oriented to person, place, and time.  Psychiatric:        Behavior: Behavior normal.     Comments: Well groomed, good eye contact, normal speech and thoughts       Diabetic Foot Exam - Simple   Simple Foot Form Diabetic Foot exam was performed with the following findings: Yes 02/18/2020 10:06 AM  Visual Inspection See comments: Yes Sensation Testing See comments: Yes Pulse Check Posterior Tibialis and Dorsalis pulse intact bilaterally: Yes Comments Right foot great toe s/p amputation, has reduced monofilament at that site, and forefoot. Left foot has reduced sensation at great toe monofilament. Some callus  formation bilateral heels and forefoot.    Recent Labs    04/01/19 0000 10/18/19 0849 02/15/20 0804  HGBA1C 7.7 9.0* 8.4*      Results for orders placed or performed in visit on 02/14/20  PSA  Result Value Ref Range   PSA 1.1 < OR = 4.0 ng/mL  Lipid panel  Result Value Ref Range   Cholesterol 153 <200 mg/dL   HDL 45 > OR = 40 mg/dL   Triglycerides 217 (H) <150 mg/dL   LDL Cholesterol (Calc) 77 mg/dL (calc)   Total CHOL/HDL Ratio 3.4 <5.0 (calc)   Non-HDL Cholesterol (Calc) 108 <130 mg/dL (calc)  COMPLETE METABOLIC PANEL WITH GFR  Result Value Ref Range   Glucose, Bld 195 (H) 65 - 99 mg/dL   BUN 39 (H) 7 - 25 mg/dL   Creat 2.70 (H) 0.70 - 1.25 mg/dL   GFR, Est Non African American 25 (L) > OR = 60 mL/min/1.72m2   GFR, Est African American 28 (L) > OR = 60 mL/min/1.52m2   BUN/Creatinine Ratio 14 6 - 22 (calc)   Sodium 136 135 - 146 mmol/L   Potassium 5.0 3.5 - 5.3 mmol/L   Chloride 100 98 - 110 mmol/L   CO2 29 20 - 32 mmol/L   Calcium 9.0 8.6 - 10.3 mg/dL   Total Protein 6.5 6.1 - 8.1 g/dL   Albumin 3.9 3.6 - 5.1 g/dL   Globulin 2.6 1.9 - 3.7 g/dL (calc)   AG Ratio 1.5 1.0 - 2.5 (calc)   Total Bilirubin 0.6 0.2 - 1.2 mg/dL   Alkaline phosphatase (APISO) 58 35 - 144 U/L   AST 22 10 - 35 U/L   ALT 23 9 - 46 U/L  CBC with Differential/Platelet  Result Value Ref Range   WBC 4.7 3.8 - 10.8 Thousand/uL   RBC 4.42 4.20 - 5.80 Million/uL   Hemoglobin 14.6 13.2 - 17.1 g/dL   HCT 43.8 38 - 50 %   MCV 99.1 80.0 - 100.0 fL   MCH 33.0 27.0 - 33.0 pg   MCHC 33.3 32.0 - 36.0 g/dL   RDW 12.4 11.0 - 15.0 %   Platelets 171 140 - 400 Thousand/uL   MPV 10.2 7.5 - 12.5 fL   Neutro Abs 2,317 1,500 - 7,800 cells/uL   Lymphs Abs 1,622 850 - 3,900 cells/uL   Absolute Monocytes 592 200 - 950 cells/uL   Eosinophils Absolute 141 15 - 500 cells/uL   Basophils Absolute 28 0 - 200 cells/uL   Neutrophils Relative % 49.3 %   Total Lymphocyte 34.5 %   Monocytes Relative 12.6 %    Eosinophils Relative 3.0 %  Basophils Relative 0.6 %  Hemoglobin A1c  Result Value Ref Range   Hgb A1c MFr Bld 8.4 (H) <5.7 % of total Hgb   Mean Plasma Glucose 194 (calc)   eAG (mmol/L) 10.8 (calc)      Assessment & Plan:   Problem List Items Addressed This Visit    Obesity (BMI 30.0-34.9)   Hyperlipidemia associated with type 2 diabetes mellitus (HCC)    Controlled LDL, high TG Last lipid panel 02/2020 Calculated ASCVD 10 yr risk score elevated with DM, CAD  Plan: 1. Continue Atorvastatin 40mg  daily 2. Continue Plavix, ASA 81mg  for secondary ASCVD risk reduction 3. Encourage improved lifestyle - low carb/cholesterol, reduce portion size, continue improving regular exercise      DM type 2 with diabetic peripheral neuropathy (HCC)    Improved A1c from 9 to 8.4 R toe x 2 amputation w/ history of chronic ulcer/osteo No hypoglycemia Complications - Hyperglycemia, CKD-III, peripheral neuropathy, DM retinopathy, DM ulceration Meds - contraindicated Metformin with CKD / Failed Bydureon BCise x 2 trials (nausea) OFF Victoza 1.8 u daily due to preference OFF Metformin due to CHF, CKD  Plan:  1. INCREASE Lantus to 32, then titrate up as advised per AVS if fasting sugar >150 for 1-2 weeks will increase by 1-2 units up to max of 40 2. Encourage improved lifestyle - low carb, low sugar diet, reduce portion size, continue improving regular exercise 3. Check CBG, bring log to next visit for review 4. 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 Continue current BP regimen - Carvedilol 25mg  BID, Hydralazine 100mg  q 8 hr, isosorbide mononitrate imdur 90mg  - OFF ARB 2. Encourage improved lifestyle - low sodium diet, regular exercise 3. Continue monitor BP outside office, bring readings to next visit, if persistently >140/90 or new symptoms notify office sooner  Advised he should return to Nephrology with decline in GFR. He should contact Dr Holley Raring to schedule f/u         No orders of the defined types were placed in this encounter.    Follow up plan: Return in about 4 months (around 06/19/2020) for 4 month DM A1c HTN.  Will need to check Creatinine/GFR trend if unable to get in to renal before next apt.  CC'd chart to patient's Nephrology to assist in arranging follow-up.  [name], [facility] 02/18/2020, 9:53 AM

## 2020-02-18 NOTE — Patient Instructions (Addendum)
Thank you for coming to the office today.  Recent Labs    04/01/19 0000 10/18/19 0849 02/15/20 0804  HGBA1C 7.7 9.0* 8.4*    Increase Lantus from 30 units up to approximately 32 units, may go up by 2 units every other week IF YOU SEE a lot of >150, for now until reach 40 units.   Go ahead and call to schedule - for a COLONOSCOPY. Since your Cologuard test was positive. (May 2021) we referred you but they could not reach you.  [facility]) [place], [place] [address] Phone: [phone]   Your provider would like to you have your annual eye exam.  Please schedule and have them send [place] a copy   [facility] [address] [place], [place] [address] Phone: [phone] [email or web address]  -----------------------  Your kidney function is reduced. Please call your kidney specialist to follow-up on this.  [facility]) [place], [place]  [address]  Phone: [phone]  [place] Holley Raring, MD  Please schedule a Follow-up Appointment to: Return in about 4 months (around 06/19/2020) for 4 month DM A1c HTN.  If you have any other questions or concerns, please feel free to call the office or send a message through [place]. You may also schedule an earlier appointment if necessary.  Additionally, you may be receiving a survey about your experience at our office within a few days to 1 week by e-mail or mail. We value your feedback.  [name], DO [place]

## 2020-02-19 ENCOUNTER — Encounter: Payer: Self-pay | Admitting: Family Medicine

## 2020-02-19 DIAGNOSIS — E669 Obesity, unspecified: Secondary | ICD-10-CM | POA: Insufficient documentation

## 2020-02-19 NOTE — Assessment & Plan Note (Addendum)
Improved controlled HTN now Complication with CKD-IIV worsening, CAD    Plan:  1. Continue current BP regimen - Carvedilol 25mg  BID, Hydralazine 100mg  q 8 hr, isosorbide mononitrate imdur 90mg  - OFF ARB 2. Encourage improved lifestyle - low sodium diet, regular exercise 3. Continue monitor BP outside office, bring readings to next visit, if persistently >140/90 or new symptoms notify office sooner  Advised he should return to Nephrology with decline in GFR. He should contact Dr Holley Raring to schedule f/u

## 2020-02-19 NOTE — Assessment & Plan Note (Signed)
Improved A1c from 9 to 8.4 R toe x 2 amputation w/ history of chronic ulcer/osteo No hypoglycemia Complications - Hyperglycemia, CKD-III, peripheral neuropathy, DM retinopathy, DM ulceration Meds - contraindicated Metformin with CKD / Failed Bydureon BCise x 2 trials (nausea) OFF Victoza 1.8 u daily due to preference OFF Metformin due to CHF, CKD  Plan:  1. INCREASE Lantus to 32, then titrate up as advised per AVS if fasting sugar >150 for 1-2 weeks will increase by 1-2 units up to max of 40 2. Encourage improved lifestyle - low carb, low sugar diet, reduce portion size, continue improving regular exercise 3. Check CBG, bring log to next visit for review 4. Continue ASA, ARB, Statin

## 2020-02-19 NOTE — Assessment & Plan Note (Signed)
Due for updated DM eye exam now in 2021 Advised to schedule with [facility]

## 2020-02-19 NOTE — Assessment & Plan Note (Signed)
S/p prior amputation of toes Followed by Podiatry / previously ID

## 2020-02-19 NOTE — Assessment & Plan Note (Signed)
Decline in renal function, CKD IV currently with reduced GFR Followed by Nephrology [facility] Dr Holley Raring OFF ARB Avoid metformin Caution excessive fluid / sodium intake Follow-up with Renal sooner, asked him to call to schedule f/u

## 2020-02-19 NOTE — Assessment & Plan Note (Signed)
Controlled LDL, high TG Last lipid panel 02/2020 Calculated ASCVD 10 yr risk score elevated with DM, CAD  Plan: 1. Continue Atorvastatin 40mg  daily 2. Continue Plavix, ASA 81mg  for secondary ASCVD risk reduction 3. Encourage improved lifestyle - low carb/cholesterol, reduce portion size, continue improving regular exercise

## 2020-02-24 DIAGNOSIS — B351 Tinea unguium: Secondary | ICD-10-CM | POA: Diagnosis not present

## 2020-02-24 DIAGNOSIS — E1142 Type 2 diabetes mellitus with diabetic polyneuropathy: Secondary | ICD-10-CM | POA: Diagnosis not present

## 2020-02-26 ENCOUNTER — Other Ambulatory Visit: Payer: Self-pay | Admitting: Cardiovascular Disease

## 2020-06-21 ENCOUNTER — Ambulatory Visit (INDEPENDENT_AMBULATORY_CARE_PROVIDER_SITE_OTHER): Payer: Medicaid Other | Admitting: Family Medicine

## 2020-06-21 ENCOUNTER — Encounter: Payer: Self-pay | Admitting: Family Medicine

## 2020-06-21 ENCOUNTER — Other Ambulatory Visit: Payer: Self-pay

## 2020-06-21 VITALS — BP 154/74 | HR 62 | Temp 97.9°F | Resp 16 | Ht 68.0 in | Wt 210.6 lb

## 2020-06-21 DIAGNOSIS — N184 Chronic kidney disease, stage 4 (severe): Secondary | ICD-10-CM

## 2020-06-21 DIAGNOSIS — I129 Hypertensive chronic kidney disease with stage 1 through stage 4 chronic kidney disease, or unspecified chronic kidney disease: Secondary | ICD-10-CM

## 2020-06-21 DIAGNOSIS — E1142 Type 2 diabetes mellitus with diabetic polyneuropathy: Secondary | ICD-10-CM

## 2020-06-21 DIAGNOSIS — E113213 Type 2 diabetes mellitus with mild nonproliferative diabetic retinopathy with macular edema, bilateral: Secondary | ICD-10-CM

## 2020-06-21 LAB — POCT GLYCOSYLATED HEMOGLOBIN (HGB A1C): Hemoglobin A1C: 10 % — AB (ref 4.0–5.6)

## 2020-06-21 NOTE — Assessment & Plan Note (Signed)
Needs to return to Eye doctor, says insurance not accepted at [facility], gave him new list

## 2020-06-21 NOTE — Patient Instructions (Addendum)
Thank you for coming to the office today.  You are missing one blood pressure pill  Telmisartan 40mg  - take one daily  Heart doctors ordered this and got it approved at [place] back in June 2021.  Please check with [facility] to see if they have this medication for you and if not, if they can request it again from Cardiologist.  [facility] - Dr [facility] [address] [place], [place]  [address]  Phone: [phone]  Call to schedule apt with Kidney specialist for blood pressure.   ---------------------------   [facility]   Address: [address] [place], [place] [address] Phone: [phone]  Website: [email or web address]  [facility]  Address: [place], [place], [place] [address] Phone: [phone]   [facility] [address] [place], [place] [address] Phone: [phone]  [facility] Address: [place], [place],  [address]  Phone: [phone]   Please schedule a Follow-up Appointment to: Return in about 3 months (around 09/19/2020) for 3 month DM A1c, HTN may need CCM referral.  If you have any other questions or concerns, please feel free to call the office or send a message through [place]. You may also schedule an earlier appointment if necessary.  Additionally, you may be receiving a survey about your experience at our office within a few days to 1 week by e-mail or mail. We value your feedback.  [name], DO [facility]

## 2020-06-21 NOTE — Assessment & Plan Note (Signed)
Elevated A1c to 10 now R toe x 2 amputation w/ history of chronic ulcer/osteo No hypoglycemia Complications - Hyperglycemia, CKD-III, peripheral neuropathy, DM retinopathy, DM ulceration Meds - contraindicated Metformin with CKD / Failed Bydureon BCise x 2 trials (nausea) OFF Victoza 1.8 u daily due to preference OFF Metformin due to CHF, CKD  Plan:  1. INCREASE Lantus to 34, then titrate up as advised per AVS if fasting sugar >150 for 1-2 weeks will increase by 1-2 units up to max of 40 2. Encourage improved lifestyle - low carb, low sugar diet, reduce portion size, continue improving regular exercise 3. Check CBG, bring log to next visit for review 4. Continue ASA, ARB, Statin  Offered CCM referral to assist with any care gap with DM and HTN but he declines today, prefers to not do phone calls for this and will try to improve his diet, he admits this is the problem, will return in 3 months.

## 2020-06-21 NOTE — Assessment & Plan Note (Signed)
Elevated BP - repeat improved still out of range Did not restart ARB as ordered by Cardiology Telmisartan 40 in 01/4826 Complication with CKD-IIV worsening, CAD    Plan:  1. Continue current BP regimen - Carvedilol 25mg  BID, Hydralazine 100mg  q 8 hr, isosorbide mononitrate imdur 90mg  - He should contact pharmacy / cardiology to check status of recently re ordered ARB Telmisartan 40mg  - Also he should contact Nephrology to follow-up with them 2. Encourage improved lifestyle - low sodium diet, regular exercise 3. Continue monitor BP outside office, bring readings to next visit, if persistently >140/90 or new symptoms notify office sooner  Advised he should return to Nephrology with decline in GFR. He should contact Dr Holley Raring to schedule f/u

## 2020-06-21 NOTE — Progress Notes (Signed)
Subjective:    Patient ID: [name], male    DOB: 08/28/1960, 59 y.o.   MRN: [id number]  [name] is a 59 y.o. male presenting on 06/21/2020 for Diabetes   HPI   CHRONIC DM, Type 2 with DM Neuropathy/ CKD-IV Obesity BMI >32 Hyperlipidemia Due for A1c - Today patient reportsdoing well CBG: Avg110-130, Low>100, High<250. Checks fasting AM most days Meds: - Lantus32udaily - OFF Victoza 1.8mg  inj daily due to preference, unsure if side effect. - OFFMetformin due to CKD. Failed Bydureon in past due to nausea Reports good compliance. Tolerating well w/o side-effects No longer on ARB - did not restart. On Atorvastatin 40mg  daily. Last labs reviewed Lifestyle: - Diet (Admits not adhering to DM diet poor choices) - Exercise (Limited regular exericse) - Due for DM Eye - [facility] did not accept his ins, cancelled apt Admits intermittent burning, tingling at times in feet, worse at night Denies hypoglycemia, polyuria, visual changes  Hypertension / CKD-IV He has been followed by [facility] Dr Holley Raring in past, has not been back recently. He remains off Metformin He was started on ARB Telmisartan 12/2019 by Cardiology but then never picked up med after it was approved. Not checking BP. Elevated today in office.  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  Depression screen [facility] 2/9 06/21/2020 02/18/2020 10/18/2019  Decreased Interest 0 0 0  Down, Depressed, Hopeless 0 0 0  PHQ - 2 Score 0 0 0    Social History   Tobacco Use  . Smoking status: Never Smoker  . Smokeless tobacco: Never Used  Vaping Use  . Vaping Use: Never used  Substance Use Topics  . Alcohol use: Yes    Comment: occassionally  . Drug use: No    Review of Systems Per HPI unless specifically indicated above     Objective:     BP (!) 154/74 (BP Location: Left Arm, Cuff Size: Normal)   Pulse 62   Temp 97.9 F (36.6 C) (Temporal)   Resp 16   Ht 5\' 8"  (1.727 m)   Wt 210 lb 9.6 oz (95.5 kg)   SpO2 98%   BMI 32.02 kg/m   Wt Readings from Last 3 Encounters:  06/21/20 210 lb 9.6 oz (95.5 kg)  02/18/20 200 lb 6.4 oz (90.9 kg)  01/07/20 201 lb (91.2 kg)    Physical Exam Vitals and nursing note reviewed.  Constitutional:      General: He is not in acute distress.    Appearance: He is well-developed. He is not diaphoretic.     Comments: Well-appearing, comfortable, cooperative  HENT:     Head: Normocephalic and atraumatic.  Eyes:     General:        Right eye: No discharge.        Left eye: No discharge.     Conjunctiva/sclera: Conjunctivae normal.  Neck:     Thyroid: No thyromegaly.  Cardiovascular:     Rate and Rhythm: Normal rate and regular rhythm.     Heart sounds: Normal heart sounds. No murmur heard.   Pulmonary:     Effort: Pulmonary effort is normal. No respiratory distress.     Breath sounds: Normal breath sounds. No wheezing or rales.  Musculoskeletal:        General: Normal range of motion.     Cervical  back: Normal range of motion and neck supple.  Lymphadenopathy:     Cervical: No cervical adenopathy.  Skin:    General: Skin is warm and dry.     Findings: No erythema or rash.  Neurological:     Mental Status: He is alert and oriented to person, place, and time.  Psychiatric:        Behavior: Behavior normal.     Comments: Well groomed, good eye contact, normal speech and thoughts     Recent Labs    10/18/19 0849 02/15/20 0804 06/21/20 0857  HGBA1C 9.0* 8.4* 10.0*     Results for orders placed or performed in visit on 06/21/20  POCT HgB A1C  Result Value Ref Range   Hemoglobin A1C 10.0 (A) 4.0 - 5.6 %      Assessment & Plan:   Problem List Items Addressed This Visit    DM type 2 with diabetic peripheral neuropathy ([place]) - Primary    Elevated A1c to 10 now R toe x  2 amputation w/ history of chronic ulcer/osteo No hypoglycemia Complications - Hyperglycemia, CKD-III, peripheral neuropathy, DM retinopathy, DM ulceration Meds - contraindicated Metformin with CKD / Failed Bydureon BCise x 2 trials (nausea) OFF Victoza 1.8 u daily due to preference OFF Metformin due to CHF, CKD  Plan:  1. INCREASE Lantus to 34, then titrate up as advised per AVS if fasting sugar >150 for 1-2 weeks will increase by 1-2 units up to max of 40 2. Encourage improved lifestyle - low carb, low sugar diet, reduce portion size, continue improving regular exercise 3. Check CBG, bring log to next visit for review 4. Continue ASA, ARB, Statin  Offered CCM referral to assist with any care gap with DM and HTN but he declines today, prefers to not do phone calls for this and will try to improve his diet, he admits this is the problem, will return in 3 months.      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 Continue current BP regimen - Carvedilol 25mg  BID, Hydralazine 100mg  q 8 hr, isosorbide mononitrate imdur 90mg  - He should contact pharmacy / cardiology to check status of recently re ordered ARB Telmisartan 40mg  - Also he should contact Nephrology to follow-up with them 2. Encourage improved lifestyle - low sodium diet, regular exercise 3. Continue monitor BP outside office,  bring readings to next visit, if persistently >140/90 or new symptoms notify office sooner  Advised he should return to Nephrology with decline in GFR. He should contact Dr Holley Raring to schedule f/u      Relevant Medications   telmisartan (MICARDIS) 40 MG tablet      No orders of the defined types were placed in this encounter.   Forward chart to Dr Holley Raring   Follow up plan: Return in about 3 months (around 09/19/2020)  for 3 month DM A1c, HTN may need CCM referral.   [name], DO [facility] 06/21/2020, 8:48 AM

## 2020-07-05 ENCOUNTER — Other Ambulatory Visit: Payer: Self-pay

## 2020-07-05 ENCOUNTER — Encounter: Payer: Self-pay | Admitting: Cardiovascular Disease

## 2020-07-05 ENCOUNTER — Ambulatory Visit (INDEPENDENT_AMBULATORY_CARE_PROVIDER_SITE_OTHER): Payer: Medicaid Other | Admitting: Cardiovascular Disease

## 2020-07-05 VITALS — BP 136/94 | HR 66 | Ht 68.0 in | Wt 212.5 lb

## 2020-07-05 DIAGNOSIS — N183 Chronic kidney disease, stage 3 unspecified: Secondary | ICD-10-CM

## 2020-07-05 DIAGNOSIS — I2510 Atherosclerotic heart disease of native coronary artery without angina pectoris: Secondary | ICD-10-CM | POA: Diagnosis not present

## 2020-07-05 DIAGNOSIS — I10 Essential (primary) hypertension: Secondary | ICD-10-CM | POA: Diagnosis not present

## 2020-07-05 DIAGNOSIS — E785 Hyperlipidemia, unspecified: Secondary | ICD-10-CM

## 2020-07-05 DIAGNOSIS — I5022 Chronic systolic (congestive) heart failure: Secondary | ICD-10-CM

## 2020-07-05 DIAGNOSIS — I255 Ischemic cardiomyopathy: Secondary | ICD-10-CM | POA: Diagnosis not present

## 2020-07-05 DIAGNOSIS — E1142 Type 2 diabetes mellitus with diabetic polyneuropathy: Secondary | ICD-10-CM

## 2020-07-05 NOTE — Progress Notes (Signed)
Cardiology Office Note   Date:  07/05/2020   ID:  [name], DOB 12-23-1960, MRN [id number]  PCP:  Olin Hauser, DO  Cardiologist:   [name], MD   Chief Complaint  Patient presents with  . Follow-up    6 month. Meds reviewed by the patient's bottles. "doing well."       History of Present Illness: [name] is a 59 y.o. male who presents for a follow-up visit regarding chronic systolic heart failure and coronary artery disease. He has past medical history of CAD, mixed ischemic and nonischemic cardiomyopathy, diabetes, hypertension, hyperlipidemia, and stage III chronic kidney disease.  He is status post drug-eluting stent placement to the LAD in April 2018.  At that time, he was found to have severe LV dysfunction with an EF of 25 to 30% and diffuse hypokinesis which was felt to be out of proportion to the LAD disease.  He also has a history of alcohol abuse.  A follow-up echocardiogram in July 2018 showed improved LV function with an EF of 40 to 45%.    Most recent echo in February 2019  showed stable LV dysfunction with an EF of 40 to 45% and grade 1 diastolic dysfunction.  Amlodipine was discontinued due to leg edema.    He had previous toe amputation but no history of peripheral arterial disease.  He has chronic kidney disease with most recent GFR of 25.  He follows with nephrology.  He has been doing reasonably well and denies chest pain or worsening dyspnea.  No significant leg edema and no claudication.  Past Medical History:  Diagnosis Date  . Ankle pain   . Chronic combined systolic (congestive) and diastolic (congestive) heart failure ([place]) 10/2016   a. 10/2016 Echo: EF 25-30%, diff HK, mild MR; b. 01/2017 Echo:  EF 40-45%, moderate LVH, Gr1DD, calcified mitral annulus, mildly dilated left atrium, RV cavity size was normal with normal RVSF; c. 08/2017 Echo: EF 40-45%, sept, antsept HK, Gr1DD, Mild MR, mildly dil LA. Nl RV fxn.  . Coronary  artery disease    a. 10/2016 Cath/PCI: LAD 44m(3.5x15 Xience Alpine DES). No other obstructive disease.  . Diabetic neuropathy ([place]   . Hyperlipidemia   . Hypertension   . 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 10/2016 Echo: EF 25-30%, diff HK; b. 01/2017 Echo: EF 40-45%; c. 08/2017 Echo: EF 40-45%.  . Pain in both feet     Past Surgical History:  Procedure Laterality Date  . CARDIAC CATHETERIZATION    . CORONARY STENT INTERVENTION N/A 10/21/2016   Procedure: Coronary Stent Intervention;  Surgeon: MWellington Hampshire MD;  [facility];  Service: Cardiovascular;  Laterality: N/A;  . LEFT HEART CATH AND CORONARY ANGIOGRAPHY N/A 10/21/2016   Procedure: Left Heart Cath and Coronary Angiography;  Surgeon: MWellington Hampshire MD;  [facility];  Service: Cardiovascular;  Laterality: N/A;  . TIBIA FRACTURE SURGERY Right 2002     Current Outpatient Medications  Medication Sig Dispense Refill  . ACCU-CHEK FASTCLIX LANCETS MISC Check sugar up to 3 x daily as instructed 102 each 12  . ACCU-CHEK GUIDE test strip Check blood sugar up to 3 times daily as advised 100 each 12  . acetaminophen (TYLENOL 8 HOUR) 650 MG CR tablet Take 1 tablet (650 mg total) by mouth every 8 (eight) hours as needed for pain.    .Marland Kitchenaspirin EC 81 MG  tablet Take 81 mg by mouth.     [name] atorvastatin (LIPITOR) 40 MG tablet Take 1 tablet (40 mg total) by mouth at bedtime. 90 tablet 3  . b complex vitamins tablet Take 1 tablet by mouth daily.    . blood glucose meter kit and supplies KIT Dispense brand based on patient and insurance preference. Use up to 3 times daily to check blood sugar. DX: ICD 10 E11.9 Diabetes Type 2. 1 each 0  . Blood Pressure Monitoring (BLOOD PRESSURE CUFF) MISC 1 each by Does not apply route daily. 1 each 0  . carvedilol (COREG) 25 MG tablet TAKE 1 TABLET (25 MG TOTAL) BY MOUTH 2 (TWO) TIMES DAILY WITH A MEAL. 180 tablet 3  . furosemide (LASIX) 20 MG  tablet Take 0.5 tablets (10 mg total) by mouth daily. 45 tablet 3  . gabapentin (NEURONTIN) 400 MG capsule TAKE 2-3 CAPSULES BY MOUTH 3 (THREE) TIMES DAILY. 270 capsule 5  . hydrALAZINE (APRESOLINE) 100 MG tablet Take 1 tablet (100 mg total) by mouth 3 (three) times daily. 270 tablet 3  . Insulin Pen Needle (B-D ULTRAFINE III SHORT PEN) 31G X 8 MM MISC USE TO INJECT INSULIN AND VICTOZA, 2 NEEDLES PER DAY 100 each 7  . isosorbide mononitrate (IMDUR) 60 MG 24 hr tablet TAKE 1.5 TABLETS (90 MG TOTAL) BY MOUTH DAILY. 135 tablet 1  . Lancets Misc. MISC Use  Brand compatable to insurance and monitor to check blood sugar up to 3 times daily. ICD10 E11.9 100 each 12  . LANTUS SOLOSTAR 100 UNIT/ML Solostar Pen INJECT 40 UNITS INTO THE SKIN AT BEDTIME. 15 mL 5  . telmisartan (MICARDIS) 40 MG tablet Take 1 tablet (40 mg total) by mouth daily.     No current facility-administered medications for this visit.    Allergies:   Sulfamethoxazole-trimethoprim, Amlodipine, Lisinopril, and Terbinafine and related    Social History:  The patient  reports that he has never smoked. He has never used smokeless tobacco. He reports current alcohol use. He reports that he does not use drugs.   Family History:  The patient's family history includes Heart disease in his maternal grandfather; Hypertension in his mother.    ROS:  Please see the history of present illness.   Otherwise, review of systems are positive for none.   All other systems are reviewed and negative.    PHYSICAL EXAM: VS:  BP (!) 136/94 (BP Location: Left Arm, Patient Position: Sitting, Cuff Size: Normal)   Pulse 66   Ht 5' 8"  (1.727 m)   Wt 212 lb 8 oz (96.4 kg)   SpO2 98%   BMI 32.31 kg/m  , BMI Body mass index is 32.31 kg/m. GEN: Well nourished, well developed, in no acute distress  HEENT: normal  Neck: Jugular venous pressure is not well visualized, carotid bruits, or masses Cardiac: RRR; no murmurs, rubs, or gallops, trace bilateral leg  edema worse on the right side Respiratory:  clear to auscultation bilaterally, normal work of breathing GI: soft, nontender, nondistended, + BS MS: no deformity or atrophy  Skin: warm and dry, no rash Neuro:  Strength and sensation are intact Psych: euthymic mood, full affect   EKG:  EKG is  ordered today. EKG showed normal sinus rhythm with left bundle branch block.   Recent Labs: 02/15/2020: ALT 23; BUN 39; Creat 2.70; Hemoglobin 14.6; Platelets 171; Potassium 5.0; Sodium 136    Lipid Panel    Component Value Date/Time   CHOL 153  02/15/2020 0804   CHOL 126 03/17/2018 1601   TRIG 217 (H) 02/15/2020 0804   HDL 45 02/15/2020 0804   HDL 35 (L) 03/17/2018 1601   CHOLHDL 3.4 02/15/2020 0804   VLDL 34 10/20/2016 0515   LDLCALC 77 02/15/2020 0804   LDLDIRECT 52 03/17/2018 1601      Wt Readings from Last 3 Encounters:  07/05/20 212 lb 8 oz (96.4 kg)  06/21/20 210 lb 9.6 oz (95.5 kg)  02/18/20 200 lb 6.4 oz (90.9 kg)       PAD Screen 10/28/2016  Previous PAD dx? No  Previous surgical procedure? No  Pain with walking? No  Feet/toe relief with dangling? No  Painful, non-healing ulcers? No  Extremities discolored? No      ASSESSMENT AND PLAN:  1.  Coronary artery disease: Status post LAD stenting in April 2018.  He has done well since then without any recurrent chest pain or dyspnea.      Continue aspirin indefinitely.  2.  Chronic systolic heart failure due to Ischemic cardiomyopathy/HFrEF: Most recent ejection fraction was 40 to 45%.  He remains on beta-blocker, ARB, hydralazine, nitrate, and Lasix.  He appears to be euvolemic.  I am going to obtain a follow-up echocardiogram to ensure stability of LV systolic function.  He does have underlying left bundle branch block and if there is worsening ejection fraction, an ICD/CRT can be considered.  I am hesitant to switch him to [facility] given advanced chronic kidney disease with a GFR of 25.  No spironolactone for the same  reason.   3.  Essential hypertension: Blood pressure is reasonably controlled on current medications.  4.  Hyperlipidemia: Most recent lipid profile showed an LDL of 77.  Previous LDL was below 70 and if repeat remains above 70, we should consider switching to high-dose rosuvastatin or adding Zetia.    5.  Type 2 diabetes mellitus: Most recent hemoglobin A1c was 8.  6.  Stage III chronic kidney disease: Stable    Disposition:   FU with me in 6 months  Signed,  [name], MD  07/05/2020 9:44 AM    [place]

## 2020-07-05 NOTE — Patient Instructions (Signed)
Medication Instructions:  Your physician recommends that you continue on your current medications as directed. Please refer to the Current Medication list given to you today.  *If you need a refill on your cardiac medications before your next appointment, please call your pharmacy*   Lab Work: None ordered If you have labs (blood work) drawn today and your tests are completely normal, you will receive your results only by: . MyChart Message (if you have MyChart) OR . A paper copy in the mail If you have any lab test that is abnormal or we need to change your treatment, we will call you to review the results.   Testing/Procedures: Your physician has requested that you have an echocardiogram. Echocardiography is a painless test that uses sound waves to create images of your heart. It provides your doctor with information about the size and shape of your heart and how well your heart's chambers and valves are working. This procedure takes approximately one hour. There are no restrictions for this procedure.     Follow-Up: At [facility], you and your health needs are our priority.  As part of our continuing mission to provide you with exceptional heart care, we have created designated Provider Care Teams.  These Care Teams include your primary Cardiologist (physician) and Advanced Practice Providers (APPs -  Physician Assistants and Nurse Practitioners) who all work together to provide you with the care you need, when you need it.  We recommend signing up for the patient portal called "MyChart".  Sign up information is provided on this After Visit Summary.  MyChart is used to connect with patients for Virtual Visits (Telemedicine).  Patients are able to view lab/test results, encounter notes, upcoming appointments, etc.  Non-urgent messages can be sent to your provider as well.   To learn more about what you can do with MyChart, go to [email or web address].    Your next appointment:   6  month(s)  The format for your next appointment:   In Person  Provider:   You may see Muhammad Arida, MD or one of the following Advanced Practice Providers on your designated Care Team:    Christopher Berge, NP  Ryan Dunn, PA-C  Jacquelyn Visser, PA-C  Cadence Furth, PA-C  Caitlin Walker, NP    Other Instructions   Echocardiogram An echocardiogram is a procedure that uses painless sound waves (ultrasound) to produce an image of the heart. Images from an echocardiogram can provide important information about:  Signs of coronary artery disease (CAD).  Aneurysm detection. An aneurysm is a weak or damaged part of an artery wall that bulges out from the normal force of blood pumping through the body.  Heart size and shape. Changes in the size or shape of the heart can be associated with certain conditions, including heart failure, aneurysm, and CAD.  Heart muscle function.  Heart valve function.  Signs of a past heart attack.  Fluid buildup around the heart.  Thickening of the heart muscle.  A tumor or infectious growth around the heart valves. Tell a health care provider about:  Any allergies you have.  All medicines you are taking, including vitamins, herbs, eye drops, creams, and over-the-counter medicines.  Any blood disorders you have.  Any surgeries you have had.  Any medical conditions you have.  Whether you are pregnant or may be pregnant. What are the risks? Generally, this is a safe procedure. However, problems may occur, including:  Allergic reaction to dye (contrast) that may be used during   the procedure. What happens before the procedure? No specific preparation is needed. You may eat and drink normally. What happens during the procedure?   An IV tube may be inserted into one of your veins.  You may receive a image enhancer through this tube. A image enhancer is an injection that improves the quality of the pictures from your heart.  A gel  will be applied to your chest.  A wand-like tool (transducer) will be moved over your chest. The gel will help to transmit the sound waves from the transducer.  The sound waves will harmlessly bounce off of your heart to allow the heart images to be captured in real-time motion. The images will be recorded on a computer. The procedure may vary among health care providers and hospitals. What happens after the procedure?  You may return to your normal, everyday life, including diet, activities, and medicines, unless your health care provider tells you not to do that. Summary  An echocardiogram is a procedure that uses painless sound waves (ultrasound) to produce an image of the heart.  Images from an echocardiogram can provide important information about the size and shape of your heart, heart muscle function, heart valve function, and fluid buildup around your heart.  You do not need to do anything to prepare before this procedure. You may eat and drink normally.  After the echocardiogram is completed, you may return to your normal, everyday life, unless your health care provider tells you not to do that. This information is not intended to replace advice given to you by your health care provider. Make sure you discuss any questions you have with your health care provider. Document Revised: 10/22/2018 Document Reviewed: 08/03/2016 Elsevier Patient Education  2020 Elsevier Inc.   

## 2020-08-01 ENCOUNTER — Other Ambulatory Visit: Payer: Medicaid Other

## 2020-08-16 ENCOUNTER — Ambulatory Visit (INDEPENDENT_AMBULATORY_CARE_PROVIDER_SITE_OTHER): Payer: Medicaid Other

## 2020-08-16 ENCOUNTER — Other Ambulatory Visit: Payer: Self-pay

## 2020-08-16 DIAGNOSIS — I5022 Chronic systolic (congestive) heart failure: Secondary | ICD-10-CM | POA: Diagnosis not present

## 2020-08-16 LAB — ECHOCARDIOGRAM COMPLETE
AR max vel: 2.91 cm2
AV Area VTI: 3.73 cm2
AV Area mean vel: 3.17 cm2
AV Mean grad: 3 mmHg
AV Peak grad: 5.8 mmHg
Ao pk vel: 1.2 m/s
Area-P 1/2: 2.46 cm2
Calc EF: 44 %
S' Lateral: 3.7 cm
Single Plane A2C EF: 45.8 %
Single Plane A4C EF: 44 %

## 2020-08-23 ENCOUNTER — Telehealth: Payer: Self-pay

## 2020-08-23 DIAGNOSIS — R931 Abnormal findings on diagnostic imaging of heart and coronary circulation: Secondary | ICD-10-CM

## 2020-08-23 DIAGNOSIS — I5022 Chronic systolic (congestive) heart failure: Secondary | ICD-10-CM

## 2020-08-23 NOTE — Telephone Encounter (Signed)
-----   Message from Wellington Hampshire, MD sent at 08/22/2020  5:03 PM EST ----- Inform patient that echo showed stable ejection fraction but some of the measurements are suggestive of a condition called amyloidosis.  In order to evaluate that, recommend a pyp scan.

## 2020-08-23 NOTE — Telephone Encounter (Signed)
Called to give the patient echo results and Dr. Fletcher Anon. The only telephone number listed is for the patients wife. [email or web address].

## 2020-08-24 ENCOUNTER — Telehealth (HOSPITAL_COMMUNITY): Payer: Self-pay | Admitting: *Deleted

## 2020-08-24 NOTE — Telephone Encounter (Signed)
Spoke with patient to remind him of his upcoming appointment on 08/30/20.  Kirstie Peri

## 2020-08-25 NOTE — Telephone Encounter (Signed)
DPR on file. Patients wife made aware of echo results and Dr. Tyrell Antonio recommendation. Patients PYP scan has been scheduled for 08/31/20. Adv the pts wife that we will call with results once available. Patients wife verbalized understanding and voiced appreciation for the call.

## 2020-08-26 ENCOUNTER — Other Ambulatory Visit: Payer: Self-pay | Admitting: Family Medicine

## 2020-08-26 DIAGNOSIS — E1142 Type 2 diabetes mellitus with diabetic polyneuropathy: Secondary | ICD-10-CM

## 2020-08-30 ENCOUNTER — Other Ambulatory Visit: Payer: Self-pay

## 2020-08-30 ENCOUNTER — Ambulatory Visit (HOSPITAL_COMMUNITY): Payer: Medicaid Other | Attending: Cardiovascular Disease

## 2020-08-30 DIAGNOSIS — I5022 Chronic systolic (congestive) heart failure: Secondary | ICD-10-CM | POA: Insufficient documentation

## 2020-08-30 DIAGNOSIS — R931 Abnormal findings on diagnostic imaging of heart and coronary circulation: Secondary | ICD-10-CM | POA: Diagnosis not present

## 2020-08-30 MED ORDER — TECHNETIUM TC 99M PYROPHOSPHATE
21.2000 | Freq: Once | INTRAVENOUS | Status: AC
  Administered 2020-08-30: 21.2 via INTRAVENOUS

## 2020-09-07 ENCOUNTER — Other Ambulatory Visit: Payer: Self-pay | Admitting: Cardiovascular Disease

## 2020-09-22 ENCOUNTER — Ambulatory Visit: Payer: Medicaid Other | Admitting: Family Medicine

## 2020-09-22 ENCOUNTER — Other Ambulatory Visit: Payer: Self-pay

## 2020-09-22 ENCOUNTER — Encounter: Payer: Self-pay | Admitting: Family Medicine

## 2020-09-22 VITALS — BP 147/72 | HR 63 | Ht 68.0 in | Wt 209.8 lb

## 2020-09-22 DIAGNOSIS — Z89411 Acquired absence of right great toe: Secondary | ICD-10-CM

## 2020-09-22 DIAGNOSIS — E1142 Type 2 diabetes mellitus with diabetic polyneuropathy: Secondary | ICD-10-CM

## 2020-09-22 DIAGNOSIS — S98131D Complete traumatic amputation of one right lesser toe, subsequent encounter: Secondary | ICD-10-CM

## 2020-09-22 DIAGNOSIS — L84 Corns and callosities: Secondary | ICD-10-CM

## 2020-09-22 LAB — POCT GLYCOSYLATED HEMOGLOBIN (HGB A1C): Hemoglobin A1C: 10.1 % — AB (ref 4.0–5.6)

## 2020-09-22 NOTE — Progress Notes (Signed)
Subjective:    Patient ID: [name], male    DOB: May 31, 1961, 60 y.o.   MRN: [id number]  [name] is a 60 y.o. male presenting on 09/22/2020 for Diabetes   HPI   CHRONIC DM, Type 2 with DM Neuropathy/ CKD-IV Obesity BMI >31 Due for A1c today - Today patient reportsdoing well CBG: Avg130 to 150+, Low>100, High<250. Checks fasting AM most days Meds: - Lantus34udaily - OFFVictoza 1.'8mg'$  inj dailydue to preference, unsure if side effect. - OFFMetformin due to CKD. Failed Bydureon in past due to nausea Reports good compliance. Tolerating well w/o side-effects No longer on ARB - did not restart. On Atorvastatin '40mg'$  daily. Last labs reviewed Lifestyle: - Diet (Admits not adhering to DM diet poor choices) - Exercise (Limited regular exericse) - Due for DM Eye - [facility] did not accept his ins, cancelled apt Admits intermittent burning, tingling at times in feet, worse at night Denies hypoglycemia, polyuria, visual changes  Hypertension / CKD-IV He has been followed by [facility] Dr Holley Raring in past, has not been back recently. He remains off Metformin He was started on ARB Telmisartan 12/2019 by Cardiology but then never picked up med after it was approved. Not checking BP. Elevated today in office.  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 Smoking status: Never Smoker  . Smokeless tobacco: Never Used  Vaping Use  . Vaping Use: Never used  Substance Use Topics  . Alcohol use: Yes    Comment: occassionally  . Drug use: No    Review of Systems Per HPI unless specifically indicated above     Objective:    BP (!) 147/72   Pulse 63   Ht '5\' 8"'$  (1.727 m)   Wt 209 lb 12.8  oz (95.2 kg)   SpO2 99%   BMI 31.90 kg/m   Wt Readings from Last 3 Encounters:  09/22/20 209 lb 12.8 oz (95.2 kg)  08/30/20 212 lb (96.2 kg)  07/05/20 212 lb 8 oz (96.4 kg)    Physical Exam Vitals and nursing note reviewed.  Constitutional:      General: He is not in acute distress.    Appearance: He is well-developed. He is obese. He is not diaphoretic.     Comments: Well-appearing, comfortable, cooperative  HENT:     Head: Normocephalic and atraumatic.  Eyes:     General:        Right eye: No discharge.        Left eye: No discharge.     Conjunctiva/sclera: Conjunctivae normal.  Neck:     Thyroid: No thyromegaly.  Cardiovascular:     Rate and Rhythm: Normal rate and regular rhythm.     Heart sounds: Normal heart sounds. No murmur heard.   Pulmonary:     Effort: Pulmonary effort is normal. No respiratory distress.     Breath sounds: Normal breath sounds. No wheezing or rales.  Musculoskeletal:        General: Normal range of motion.     Cervical back: Normal range of motion and neck supple.  Lymphadenopathy:     Cervical: No cervical adenopathy.  Skin:    General:  Skin is warm and dry.     Findings: No erythema or rash.  Neurological:     Mental Status: He is alert and oriented to person, place, and time.  Psychiatric:        Behavior: Behavior normal.     Comments: Well groomed, good eye contact, normal speech and thoughts      Diabetic Foot Exam - Simple   Simple Foot Form Diabetic Foot exam was performed with the following findings: Yes 09/22/2020  8:40 AM  Visual Inspection See comments: Yes Sensation Testing See comments: Yes Pulse Check Posterior Tibialis and Dorsalis pulse intact bilaterally: Yes Comments Right foot s/p great toe and 5th toe amputation with deformity well healed, has reduced monofilament at that site, and forefoot. Left foot has reduced sensation at great toe monofilament. Some callus formation bilateral heels and forefoot, increased  callus R great toe forefoot area with reduced monofilament.       Results for orders placed or performed in visit on 09/22/20  POCT HgB A1C  Result Value Ref Range   Hemoglobin A1C 10.1 (A) 4.0 - 5.6 %      Assessment & Plan:   Problem List Items Addressed This Visit    Right great toe amputee ([place])   DM type 2 with diabetic peripheral neuropathy ([place]) - Primary    Elevated A1c still at 10.1 R toe x 2 amputation w/ history of chronic ulcer/osteo No hypoglycemia Complications - Hyperglycemia, CKD-III, peripheral neuropathy, DM retinopathy, DM ulceration Meds - contraindicated Metformin with CKD / Failed Bydureon BCise x 2 trials (nausea) OFF Victoza 1.8 u daily due to preference OFF Metformin due to CHF, CKD  Plan:  1. INCREASE Lantus to 36, then titrate up as advised per AVS if fasting sugar >150 for 1-2 weeks will increase by 1-2 units up to max of 40 - he did not increase since prior visit. - Not adhering to lifestyle  2. Encourage improved lifestyle - low carb, low sugar diet, reduce portion size, continue improving regular exercise 3. Check CBG, bring log to next visit for review 4. Continue ASA, ARB, Statin  Offered CCM referral to assist with any care gap with DM and HTN but he declines today, prefers to not do phone calls  Next advised if still elevated A1c will refer to Endocrinology for further management assistance.      Relevant Orders   POCT HgB A1C (Completed)   Amputation of little toe (HCC)    Other Visit Diagnoses    Callus of foot          Chronic bilateral diabetic neuropathy with callus formation in both feet, as complication of diabetes Additionally with R Foot Deformities with s/p R Great Toe Amputation and R 5th Toe Amputation years prior, see HPI above, healed over now. Has 3 remaining toes on R foot.  Completed DM Foot Exam today in office, 09/22/20. See exam note.  Plan - Proceed with ordering Diabetic Shoes, completed form today, will fax  back to [place] along with this office note - Patient would benefit from Diabetic Shoes due to neuropathy with callus formation and Right foot deformity s/p x 2 toe amputation, diabetes is being managed and treatment has been adjusted, and I am continuing to monitor and manage diabetes.   No orders of the defined types were placed in this encounter.     Follow up plan: Return in about 3 months (around 12/23/2020) for 3 month DM A1c, refer to Endocrine if  needed.  [name], [facility] 09/22/2020, 8:35 AM

## 2020-09-22 NOTE — Assessment & Plan Note (Signed)
Elevated A1c still at 10.1 R toe x 2 amputation w/ history of chronic ulcer/osteo No hypoglycemia Complications - Hyperglycemia, CKD-III, peripheral neuropathy, DM retinopathy, DM ulceration Meds - contraindicated Metformin with CKD / Failed Bydureon BCise x 2 trials (nausea) OFF Victoza 1.8 u daily due to preference OFF Metformin due to CHF, CKD  Plan:  1. INCREASE Lantus to 36, then titrate up as advised per AVS if fasting sugar >150 for 1-2 weeks will increase by 1-2 units up to max of 40 - he did not increase since prior visit. - Not adhering to lifestyle  2. Encourage improved lifestyle - low carb, low sugar diet, reduce portion size, continue improving regular exercise 3. Check CBG, bring log to next visit for review 4. Continue ASA, ARB, Statin  Offered CCM referral to assist with any care gap with DM and HTN but he declines today, prefers to not do phone calls  Next advised if still elevated A1c will refer to Endocrinology for further management assistance.

## 2020-09-22 NOTE — Patient Instructions (Addendum)
Thank you for coming to the office today.  Increase Lantus insulin by 2 units now, and then within 1 week if still fasting sugar >150 daily can increase by 1 additional unit each week. May stop at 40 units if you get there.  DM Shoes ordered.  May need Endocrinology referral in future.  Please schedule a Follow-up Appointment to: Return in about 3 months (around 12/23/2020) for 3 month DM A1c, refer to Endocrine if needed.  If you have any other questions or concerns, please feel free to call the office or send a message through [place]. You may also schedule an earlier appointment if necessary.  Additionally, you may be receiving a survey about your experience at our office within a few days to 1 week by e-mail or mail. We value your feedback.  [name], DO [place]

## 2020-11-14 DIAGNOSIS — I739 Peripheral vascular disease, unspecified: Secondary | ICD-10-CM | POA: Diagnosis not present

## 2020-11-14 DIAGNOSIS — M79671 Pain in right foot: Secondary | ICD-10-CM | POA: Diagnosis not present

## 2020-11-14 DIAGNOSIS — B351 Tinea unguium: Secondary | ICD-10-CM | POA: Diagnosis not present

## 2020-11-14 DIAGNOSIS — E1140 Type 2 diabetes mellitus with diabetic neuropathy, unspecified: Secondary | ICD-10-CM | POA: Diagnosis not present

## 2020-11-14 DIAGNOSIS — Z89421 Acquired absence of other right toe(s): Secondary | ICD-10-CM | POA: Diagnosis not present

## 2020-11-23 DIAGNOSIS — I10 Essential (primary) hypertension: Secondary | ICD-10-CM | POA: Diagnosis not present

## 2020-11-23 DIAGNOSIS — M79671 Pain in right foot: Secondary | ICD-10-CM | POA: Diagnosis not present

## 2020-11-23 DIAGNOSIS — I739 Peripheral vascular disease, unspecified: Secondary | ICD-10-CM | POA: Diagnosis not present

## 2020-11-23 DIAGNOSIS — E1151 Type 2 diabetes mellitus with diabetic peripheral angiopathy without gangrene: Secondary | ICD-10-CM | POA: Diagnosis not present

## 2020-11-23 DIAGNOSIS — E785 Hyperlipidemia, unspecified: Secondary | ICD-10-CM | POA: Diagnosis not present

## 2020-11-23 DIAGNOSIS — I2510 Atherosclerotic heart disease of native coronary artery without angina pectoris: Secondary | ICD-10-CM | POA: Diagnosis not present

## 2020-11-23 DIAGNOSIS — R0989 Other specified symptoms and signs involving the circulatory and respiratory systems: Secondary | ICD-10-CM | POA: Diagnosis not present

## 2020-11-23 DIAGNOSIS — Z794 Long term (current) use of insulin: Secondary | ICD-10-CM | POA: Diagnosis not present

## 2020-12-19 ENCOUNTER — Telehealth: Payer: Self-pay

## 2020-12-19 ENCOUNTER — Other Ambulatory Visit: Payer: Self-pay | Admitting: Family Medicine

## 2020-12-19 DIAGNOSIS — E1142 Type 2 diabetes mellitus with diabetic polyneuropathy: Secondary | ICD-10-CM

## 2020-12-19 NOTE — Telephone Encounter (Signed)
Pt is requesting a refill on  Lantus  [facility] [place]

## 2020-12-19 NOTE — Telephone Encounter (Signed)
  Notes to clinic:  Product Backordered/Unavailable:CANNOT DISPENSE JUST 1 PEN   Requested Prescriptions  Pending Prescriptions Disp Refills   LANTUS SOLOSTAR 100 UNIT/ML Solostar Pen [Pharmacy Med Name: LANTUS SOLOSTAR 100 UNIT/ML]  0    Sig: INJECT 40 UNITS INTO THE SKIN AT BEDTIME.      Endocrinology:  Diabetes - Insulins Failed - 12/19/2020  3:20 PM      Failed - HBA1C is between 0 and 7.9 and within 180 days    Hemoglobin A1C  Date Value Ref Range Status  09/22/2020 10.1 (A) 4.0 - 5.6 % Final  04/01/2019 7.7  Final    Comment:    [facility]   Hgb A1c MFr Bld  Date Value Ref Range Status  02/15/2020 8.4 (H) <5.7 % of total Hgb Final    Comment:    For someone without known diabetes, a hemoglobin A1c value of 6.5% or greater indicates that they may have  diabetes and this should be confirmed with a follow-up  test. . For someone with known diabetes, a value <7% indicates  that their diabetes is well controlled and a value  greater than or equal to 7% indicates suboptimal  control. A1c targets should be individualized based on  duration of diabetes, age, comorbid conditions, and  other considerations. . Currently, no consensus exists regarding use of hemoglobin A1c for diagnosis of diabetes for children. Renella Cunas - Valid encounter within last 6 months    Recent Outpatient Visits           2 months ago DM type 2 with diabetic peripheral neuropathy [facility])   [place], DO   6 months ago DM type 2 with diabetic peripheral neuropathy [facility])   [facility], Devonne Doughty, DO   10 months ago DM type 2 with diabetic chronic skin ulcer [facility])   [place], DO   1 year ago DM type 2 with diabetic peripheral neuropathy [facility])   [facility], DO   1 year ago DM type 2 with diabetic peripheral neuropathy [facility])    [facility], Devonne Doughty, DO       Future Appointments             In 2 weeks [place] [facility], Devonne Doughty, [facility], [facility]

## 2020-12-30 ENCOUNTER — Other Ambulatory Visit: Payer: Self-pay | Admitting: Cardiovascular Disease

## 2021-01-01 NOTE — Telephone Encounter (Signed)
Scheduled

## 2021-01-01 NOTE — Telephone Encounter (Signed)
Attempted to schedule.  LMOV to call office.  ° °

## 2021-01-01 NOTE — Telephone Encounter (Signed)
Please schedule overdue F/U appointment. Thank you! ?

## 2021-01-04 ENCOUNTER — Ambulatory Visit: Payer: Medicaid Other | Admitting: Family Medicine

## 2021-01-04 ENCOUNTER — Other Ambulatory Visit: Payer: Self-pay

## 2021-01-04 ENCOUNTER — Encounter: Payer: Self-pay | Admitting: Family Medicine

## 2021-01-04 VITALS — BP 142/80 | HR 61 | Ht 68.0 in | Wt 207.8 lb

## 2021-01-04 DIAGNOSIS — N184 Chronic kidney disease, stage 4 (severe): Secondary | ICD-10-CM

## 2021-01-04 DIAGNOSIS — E1142 Type 2 diabetes mellitus with diabetic polyneuropathy: Secondary | ICD-10-CM | POA: Diagnosis not present

## 2021-01-04 DIAGNOSIS — E113213 Type 2 diabetes mellitus with mild nonproliferative diabetic retinopathy with macular edema, bilateral: Secondary | ICD-10-CM

## 2021-01-04 DIAGNOSIS — I129 Hypertensive chronic kidney disease with stage 1 through stage 4 chronic kidney disease, or unspecified chronic kidney disease: Secondary | ICD-10-CM | POA: Diagnosis not present

## 2021-01-04 DIAGNOSIS — S98131D Complete traumatic amputation of one right lesser toe, subsequent encounter: Secondary | ICD-10-CM

## 2021-01-04 DIAGNOSIS — Z89411 Acquired absence of right great toe: Secondary | ICD-10-CM | POA: Diagnosis not present

## 2021-01-04 LAB — POCT GLYCOSYLATED HEMOGLOBIN (HGB A1C): Hemoglobin A1C: 11.6 % — AB (ref 4.0–5.6)

## 2021-01-04 MED ORDER — BD PEN NEEDLE SHORT U/F 31G X 8 MM MISC: 3 refills | Status: DC

## 2021-01-04 NOTE — Patient Instructions (Addendum)
Thank you for coming to the office today.  Recent Labs    06/21/20 0857 09/22/20 0836 01/04/21 0813  HGBA1C 10.0* 10.1* 11.6*   Ordered new insulin needles, at pharmacy.  Increase dose Lantus from 34 up to 36 now, then by next week should gradually go up to 40 units per day. May need to keep going up by +2 units every 7 days or 1 week if fasting sugar consistently >150. Max dose would be up to 50 by the end of 3 months.  Referral to our nurse / pharmacy team, stay tuned for calls from [facility] and [place].  Please schedule a Follow-up Appointment to: No follow-ups on file.  If you have any other questions or concerns, please feel free to call the office or send a message through [place]. You may also schedule an earlier appointment if necessary.  Additionally, you may be receiving a survey about your experience at our office within a few days to 1 week by e-mail or mail. We value your feedback.  [name], DO [place]

## 2021-01-04 NOTE — Assessment & Plan Note (Addendum)
Elevated A1c still at >11 now R toe x 2 amputation w/ history of chronic ulcer/osteo No hypoglycemia Complications - Hyperglycemia, CKD-III, peripheral neuropathy, DM retinopathy, DM ulceration Meds - contraindicated Metformin with CKD / Failed Bydureon BCise x 2 trials (nausea) OFF Victoza 1.8 u daily due to preference OFF Metformin due to CHF, CKD  Plan:  1. INCREASE Lantus to 36, then titrate up as advised per AVS if fasting sugar >150 up to +2 units each 1 week to 40, can go up to 50 gradually max - he did not increase since prior visit. - Not adhering to lifestyle  2. Encourage improved lifestyle - low carb, low sugar diet, reduce portion size, continue improving regular exercise 3. Check CBG, bring log to next visit for review 4. Continue ASA, ARB, Statin  Referral to CCM  Future may still need to refer to Endocrinology for further management assistance. If elevated A1c

## 2021-01-04 NOTE — Progress Notes (Signed)
Subjective:    Patient ID: [name], male    DOB: 08-27-1960, 60 y.o.   MRN: [id number]  [name] is a 60 y.o. 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 Checks fasting AM most days Meds: - Lantus 34u daily - did not increase from last visit. - OFF Victoza 1.8 mg inj daily due to preference, unsure if side effect. - OFF Metformin due to CKD. Failed Bydureon in past due to nausea Reports good compliance. Tolerating well w/o side-effects No longer on ARB - did not restart. On Atorvastatin '40mg'$  daily. Last labs reviewed Lifestyle: - Diet (Admits not adhering to DM diet poor choices) - Exercise (Limited regular exericse) - Due for DM Eye he is trying to schedule.  Admits intermittent burning, tingling at times in feet, worse at night Denies hypoglycemia, polyuria, visual changes   Hypertension / CKD-IV He has been followed by [facility] Dr Holley Raring in past, has not been back recently. He remains off Metformin On Telmisartan '40mg'$  daily., Carvedilol 25 BID, Hydralazine Not checking BP. Elevated today in office.   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    Depression screen [facility] 2/9 01/04/2021 06/21/2020 02/18/2020  Decreased Interest 0 0 0  Down, Depressed, Hopeless 0 0 0  PHQ - 2 Score 0 0 0  Altered sleeping 3 - -  Tired, decreased energy 3 - -  Change in appetite 0 - -  Feeling bad or failure about yourself  0 - -  Trouble concentrating 0 - -  Moving slowly or fidgety/restless 0 - -  Suicidal thoughts 0 - -  PHQ-9 Score 6 - -  Difficult doing work/chores Not difficult at all - -    Social History   Tobacco Use   Smoking status: Never   Smokeless tobacco: Never  Vaping Use    Vaping Use: Never used  Substance Use Topics   Alcohol use: Yes    Comment: occassionally   Drug use: No    Review of Systems Per HPI unless specifically indicated above     Objective:    BP (!) 142/80 (BP Location: Left Arm, Cuff Size: Normal)   Pulse 61   Ht '5\' 8"'$  (1.727 m)   Wt 207 lb 12.8 oz (94.3 kg)   SpO2 100%   BMI 31.60 kg/m   Wt Readings from Last 3 Encounters:  01/04/21 207 lb 12.8 oz (94.3 kg)  09/22/20 209 lb 12.8 oz (95.2 kg)  08/30/20 212 lb (96.2 kg)    Physical Exam Vitals and nursing note reviewed.  Constitutional:      General: He is not in acute distress.    Appearance: He is well-developed. He is not diaphoretic.     Comments: Well-appearing, comfortable, cooperative  HENT:     Head: Normocephalic and atraumatic.  Eyes:     General:        Right eye: No discharge.        Left eye: No discharge.     Conjunctiva/sclera: Conjunctivae normal.  Neck:     Thyroid: No thyromegaly.  Cardiovascular:     Rate and Rhythm: Normal rate and regular rhythm.     Pulses:  Normal pulses.     Heart sounds: Normal heart sounds. No murmur heard. Pulmonary:     Effort: Pulmonary effort is normal. No respiratory distress.     Breath sounds: Normal breath sounds. No wheezing or rales.  Musculoskeletal:        General: Normal range of motion.     Cervical back: Normal range of motion and neck supple.     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  Skin:    General: Skin is warm and dry.     Findings: No erythema or rash.  Neurological:     Mental Status: He is alert and oriented to person, place, and time. Mental status is at baseline.  Psychiatric:        Behavior: Behavior normal.     Comments: Well groomed, good eye contact, normal speech and thoughts   Results for orders placed or performed in visit on 01/04/21  POCT HgB A1C  Result Value Ref Range    Hemoglobin A1C 11.6 (A) 4.0 - 5.6 %      Assessment & Plan:   Problem List Items Addressed This Visit     Right great toe amputee ([place])   DM type 2 with diabetic peripheral neuropathy ([place]) - Primary    Elevated A1c still at >11 now R toe x 2 amputation w/ history of chronic ulcer/osteo No hypoglycemia Complications - Hyperglycemia, CKD-III, peripheral neuropathy, DM retinopathy, DM ulceration Meds - contraindicated Metformin with CKD / Failed Bydureon BCise x 2 trials (nausea) OFF Victoza 1.8 u daily due to preference OFF Metformin due to CHF, CKD  Plan:  1. INCREASE Lantus to 36, then titrate up as advised per AVS if fasting sugar >150 up to +2 units each 1 week to 40, can go up to 50 gradually max - he did not increase since prior visit. - Not adhering to lifestyle  2. Encourage improved lifestyle - low carb, low sugar diet, reduce portion size, continue improving regular exercise 3. Check CBG, bring log to next visit for review 4. Continue ASA, ARB, Statin  Referral to CCM  Future may still need to refer to Endocrinology for further management assistance. 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 Continue current BP regimen - Carvedilol '25mg'$  BID, Hydralazine '100mg'$  q 8 hr, isosorbide mononitrate imdur '90mg'$  - Also he should contact Nephrology to follow-up with them 2. Encourage improved lifestyle - low sodium diet, regular exercise 3. Continue monitor BP outside office, bring readings to next visit, if  persistently >140/90 or new symptoms notify office sooner       Relevant Orders   AMB Referral to [facility] Coordinaton   Amputation of little toe ([place])     Orders Placed This Encounter  Procedures   AMB Referral to [facility] Coordinaton    Referral Priority:   Routine  Referral Type:   Consultation    Referral Reason:   Care Coordination    Number of Visits Requested:   1   POCT HgB A1C     Meds ordered this encounter  Medications   Insulin Pen Needle (B-D ULTRAFINE III SHORT PEN) 31G X 8 MM MISC    Sig: USE TO INJECT INSULIN NIGHTLY    Dispense:  100 each    Refill:  3    DX Code Needed  . E11.42      Follow up plan: Return in about 3 months (around 04/06/2021) for 3 month DM A1c HTN.  [name], [facility] 01/04/2021, 8:13 AM

## 2021-01-04 NOTE — Assessment & Plan Note (Signed)
Elevated BP - repeat improved still out of range Complication with CKD-IIV worsening, CAD    Plan:  1. Continue current BP regimen - Carvedilol '25mg'$  BID, Hydralazine '100mg'$  q 8 hr, isosorbide mononitrate imdur '90mg'$  - Also he should contact Nephrology to follow-up with them 2. Encourage improved lifestyle - low sodium diet, regular exercise 3. Continue monitor BP outside office, bring readings to next visit, if persistently >140/90 or new symptoms notify office sooner

## 2021-02-02 ENCOUNTER — Ambulatory Visit: Payer: Medicaid Other | Admitting: Cardiovascular Disease

## 2021-02-05 ENCOUNTER — Encounter: Payer: Self-pay | Admitting: Cardiovascular Disease

## 2021-02-09 ENCOUNTER — Other Ambulatory Visit: Payer: Self-pay | Admitting: Family Medicine

## 2021-02-09 DIAGNOSIS — I10 Essential (primary) hypertension: Secondary | ICD-10-CM

## 2021-02-24 ENCOUNTER — Other Ambulatory Visit: Payer: Self-pay | Admitting: Family

## 2021-02-24 ENCOUNTER — Other Ambulatory Visit: Payer: Self-pay | Admitting: Cardiovascular Disease

## 2021-02-24 ENCOUNTER — Other Ambulatory Visit: Payer: Self-pay | Admitting: Family Medicine

## 2021-02-24 DIAGNOSIS — N184 Chronic kidney disease, stage 4 (severe): Secondary | ICD-10-CM

## 2021-02-24 DIAGNOSIS — E1169 Type 2 diabetes mellitus with other specified complication: Secondary | ICD-10-CM

## 2021-02-24 DIAGNOSIS — I129 Hypertensive chronic kidney disease with stage 1 through stage 4 chronic kidney disease, or unspecified chronic kidney disease: Secondary | ICD-10-CM

## 2021-02-24 DIAGNOSIS — I10 Essential (primary) hypertension: Secondary | ICD-10-CM

## 2021-02-26 NOTE — Telephone Encounter (Signed)
Rx request sent to pharmacy.  

## 2021-03-16 ENCOUNTER — Encounter: Payer: Self-pay | Admitting: Nurse Practitioner

## 2021-03-16 ENCOUNTER — Ambulatory Visit: Payer: Medicaid Other | Admitting: Nurse Practitioner

## 2021-03-16 ENCOUNTER — Other Ambulatory Visit: Payer: Self-pay

## 2021-03-16 VITALS — BP 148/90 | HR 68 | Ht 68.0 in | Wt 208.0 lb

## 2021-03-16 DIAGNOSIS — I10 Essential (primary) hypertension: Secondary | ICD-10-CM | POA: Diagnosis not present

## 2021-03-16 DIAGNOSIS — I5022 Chronic systolic (congestive) heart failure: Secondary | ICD-10-CM | POA: Diagnosis not present

## 2021-03-16 DIAGNOSIS — I2510 Atherosclerotic heart disease of native coronary artery without angina pectoris: Secondary | ICD-10-CM | POA: Diagnosis not present

## 2021-03-16 DIAGNOSIS — E1169 Type 2 diabetes mellitus with other specified complication: Secondary | ICD-10-CM

## 2021-03-16 DIAGNOSIS — E785 Hyperlipidemia, unspecified: Secondary | ICD-10-CM

## 2021-03-16 DIAGNOSIS — I255 Ischemic cardiomyopathy: Secondary | ICD-10-CM

## 2021-03-16 DIAGNOSIS — N183 Chronic kidney disease, stage 3 unspecified: Secondary | ICD-10-CM | POA: Diagnosis not present

## 2021-03-16 DIAGNOSIS — I1A0 Resistant hypertension: Secondary | ICD-10-CM

## 2021-03-16 MED ORDER — ISOSORBIDE MONONITRATE ER 60 MG PO TB24: 90.0000 mg | ORAL_TABLET | Freq: Every day | ORAL | 3 refills | Status: DC

## 2021-03-16 MED ORDER — CARVEDILOL 25 MG PO TABS: 25.0000 mg | ORAL_TABLET | Freq: Two times a day (BID) | ORAL | 3 refills | Status: DC

## 2021-03-16 MED ORDER — HYDRALAZINE HCL 100 MG PO TABS: 100.0000 mg | ORAL_TABLET | Freq: Three times a day (TID) | ORAL | 3 refills | Status: DC

## 2021-03-16 MED ORDER — ATORVASTATIN CALCIUM 40 MG PO TABS: 40.0000 mg | ORAL_TABLET | Freq: Every day | ORAL | 3 refills | Status: DC

## 2021-03-16 NOTE — Progress Notes (Signed)
Office Visit    Patient Name: [name] Date of Encounter: 03/16/2021  Primary Care Provider:  Olin Hauser, DO Primary Cardiologist:  [name], MD  Chief Complaint    60 year old male with a history of CAD, mixed ischemic and nonischemic cardiomyopathy, chronic combined systolic and diastolic congestive heart failure, stage III chronic kidney disease, hypertension, and hyperlipidemia presents for f/u for management of CAD, cardiomyopathy, and heart failure.  Past Medical History    Past Medical History:  Diagnosis Date   Ankle pain    Chronic combined systolic (congestive) and diastolic (congestive) heart failure ([place]) 10/2016   a. 10/2016 Echo: EF 25-30%, diff HK; b. 01/2017 Echo:  EF 40-45%, GrI DD; c. 08/2017 Echo: EF 40-45%, GrI DD; d. 08/2020 Echo: EF 45-50%, glob HK. Mod asymm LVH, GrI DD, Nl RV size/fxn; e. 08/2020 PYP: equivocal for ATTR cardiac amyloid.   CKD (chronic kidney disease), stage III - IV ([place])    Coronary artery disease    a. 10/2016 Cath/PCI: LAD 32m(3.5x15 Xience [place]pine DES). No other obstructive disease.   Diabetic neuropathy ([place]    Hyperlipidemia    Hypertension    Mixed Ischemic & Nonischemic cardiomyopathy (CAD & ETOH)    a. 10/2016 Echo: EF 25-30%, diff HK; b. 01/2017 Echo:  EF 40-45%, GrI DD; c. 08/2017 Echo: EF 40-45%, GrI DD; d. 08/2020 Echo: EF 45-50%, glob HK. Mod asymm LVH, GrI DD, Nl RV size/fxn.   Pain in both feet    Past Surgical History:  Procedure Laterality Date   CARDIAC CATHETERIZATION     CORONARY STENT INTERVENTION N/A 10/21/2016   Procedure: Coronary Stent Intervention;  Surgeon: MWellington Hampshire MD;  [facility];  Service: Cardiovascular;  Laterality: N/A;   LEFT HEART CATH AND CORONARY ANGIOGRAPHY N/A 10/21/2016   Procedure: Left Heart Cath and Coronary Angiography;  Surgeon: MWellington Hampshire MD;  [facility];  Service: Cardiovascular;  Laterality: N/A;   TIBIA FRACTURE  SURGERY Right 2002    Allergies  Allergies  Allergen Reactions   Sulfamethoxazole-Trimethoprim Other (See Comments)    AKI    Amlodipine Other (See Comments)    Edema   Lisinopril     Hyperkalemia    Terbinafine And Related Rash    History of Present Illness    60year old male with a history of CAD, mixed ischemic and nonischemic cardiomyopathy, chronic combined systolic and diastolic congestive heart failure, stage III chronic kidney disease, hypertension, and hyperlipidemia.  Patient had cardiac cath on 10/21/2016 and received DES to mid LAD lesion that was 90% stenosed. Echo in 10/2016 showed severe LV dysfunction with EF of 25-30 % and diffuse hypokinesis. Most recent echo on 08/16/2020 shows decreased LV function with EF of 45-50%; LV global hypokinesis.  There was concern re: possible cardiac amyloidosis, and he underwent PYP in 08/2020, which was equivocal for amyloid. Patient was taken off amlodipine d/t pedal edema and self discontinued his aspirin last visit but was agreeable to restarting it. He had also stopped taking his telmisartan as he ran out of refills and did not seek to refill the medication until earlier this month.  Patient has not continued aspirin, he was educated on potential risks for discontinuation of this medication. He has otherwise done well w/o symptoms and limitations.  He does not routinely exercise.  He denies chest pain, palpitations, dyspnea, pnd, orthopnea, n, v, dizziness, syncope, edema, weight gain, or early satiety.   Home  Medications    Current Outpatient Medications  Medication Sig Dispense Refill   ACCU-CHEK FASTCLIX LANCETS MISC Check sugar up to 3 x daily as instructed 102 each 12   ACCU-CHEK GUIDE test strip Check blood sugar up to 3 times daily as advised 100 each 12   acetaminophen (TYLENOL 8 HOUR) 650 MG CR tablet Take 1 tablet (650 mg total) by mouth every 8 (eight) hours as needed for pain.     aspirin EC 81 MG tablet Take 81 mg by  mouth.      b complex vitamins tablet Take 1 tablet by mouth daily.     blood glucose meter kit and supplies KIT Dispense brand based on patient and insurance preference. Use up to 3 times daily to check blood sugar. DX: ICD 10 E11.9 Diabetes Type 2. 1 each 0   Blood Pressure Monitoring (BLOOD PRESSURE CUFF) MISC 1 each by Does not apply route daily. 1 each 0   furosemide (LASIX) 20 MG tablet Take 0.5 tablets (10 mg total) by mouth daily. Please keep your upcoming appointment for refills. 15 tablet 0   gabapentin (NEURONTIN) 400 MG capsule TAKE 2 TO 3 CAPSULES BY MOUTH 3 TIMES DAILY. 270 capsule 5   Insulin Pen Needle (B-D ULTRAFINE III SHORT PEN) 31G X 8 MM MISC USE TO INJECT INSULIN NIGHTLY 100 each 3   Lancets Misc. MISC Use  Brand compatable to insurance and monitor to check blood sugar up to 3 times daily. ICD10 E11.9 100 each 12   LANTUS SOLOSTAR 100 UNIT/ML Solostar Pen INJECT 40 UNITS INTO THE SKIN AT BEDTIME. 15 mL 2   telmisartan (MICARDIS) 40 MG tablet Take 1 tablet (40 mg total) by mouth daily. Please keep your upcoming appointment for refills. 30 tablet 0   atorvastatin (LIPITOR) 40 MG tablet Take 1 tablet (40 mg total) by mouth daily. 90 tablet 3   carvedilol (COREG) 25 MG tablet Take 1 tablet (25 mg total) by mouth 2 (two) times daily with a meal. 180 tablet 3   hydrALAZINE (APRESOLINE) 100 MG tablet Take 1 tablet (100 mg total) by mouth 3 (three) times daily. 270 tablet 3   isosorbide mononitrate (IMDUR) 60 MG 24 hr tablet Take 1.5 tablets (90 mg total) by mouth daily. 135 tablet 3   No current facility-administered medications for this visit.     Review of Systems    He denies chest pain, palpitations, dyspnea, pnd, orthopnea, n, v, dizziness, syncope, edema, weight gain, or early satiety.  All other systems reviewed and are otherwise negative except as noted above.  Physical Exam    VS:  BP (!) 148/90 (BP Location: Left Arm, Patient Position: Sitting, Cuff Size: Normal)    Pulse 68   Ht _0  (1.727 m)   Wt 208 lb (94.3 kg)   SpO2 93%   BMI 31.63 kg/m  , BMI Body mass index is 31.63 kg/m.     GEN: Well nourished, well developed, in no acute distress. HEENT: normal. Neck: Supple, no JVD, carotid bruits, or masses. Cardiac: RRR, no murmurs, rubs, or gallops. No clubbing, cyanosis, edema.  PT 1+ and equal bilaterally.  Respiratory:  Respirations regular and unlabored, clear to auscultation bilaterally. GI: Soft, nontender, nondistended, BS + x 4. MS: no deformity or atrophy. Skin: warm and dry, no rash. Neuro:  Strength and sensation are intact. Psych: Normal affect.  Accessory Clinical Findings    ECG personally reviewed by me today - Normal Sinus Rhythm 68, left  axis deviation, LAE, and left bundle branch block. No acute changes.  Lab Results  Component Value Date   WBC 4.7 02/15/2020   HGB 14.6 02/15/2020   HCT 43.8 02/15/2020   MCV 99.1 02/15/2020   PLT 171 02/15/2020   Lab Results  Component Value Date   CREATININE 2.70 (H) 02/15/2020   BUN 39 (H) 02/15/2020   NA 136 02/15/2020   K 5.0 02/15/2020   CL 100 02/15/2020   CO2 29 02/15/2020   Lab Results  Component Value Date   ALT 23 02/15/2020   AST 22 02/15/2020   ALKPHOS 73 07/16/2018   BILITOT 0.6 02/15/2020   Lab Results  Component Value Date   CHOL 153 02/15/2020   HDL 45 02/15/2020   LDLCALC 77 02/15/2020   LDLDIRECT 52 03/17/2018   TRIG 217 (H) 02/15/2020   CHOLHDL 3.4 02/15/2020    Lab Results  Component Value Date   HGBA1C 11.6 (A) 01/04/2021    Assessment & Plan    1.  CAD: status post DES LAD in 10/2016. He denies chest pain or dyspnea. He remains on statin, carvedilol, telmisartan, isororbide mononitrate. He has not been taking ASA, but is willing to resume.  Educated on the importance of taking his aspirin.   2. Mixed ischemic and nonischemic cardiomyopathy/HFmrEF:  most recent echo shows EF of 45-50% in 08/2020.  PYP equivocal for amyloid.  Weights have been  stable over time.  Euvolemic on exam. Discussed the importance of low sodium diet and daily weights. Blood pressure is elevated in office at 148/90.  Cont ? blocker, ARB, diuretic, hydral/nitrate.  Pending labs, may titrate ARB is renal fxn/K ok.  3. Hypertension:  Blood pressure is elevated in office at 148/90. Will wait on lab work to make decision to increase telmisartan to 80 mg. He states he has home b/p cuff, instructed to log daily b/p measurements.   4. Type 2 Diabetes Mellitus: Most recent HA1C of 11.6. Patient states he only checks his blood sugars twice a week. Has follow-up with primary care later in the month.   On lantus.  Consider SGLT2i in the setting of LV dysfxn.  5. Stage III chronic kidney disease: Follow-up BMP today. Last creatinine of 2.7.   6.  Hyperlipidemia:  on lipitor 40.  LDL of 77 in 02/2020.  Overdue for labs.  F/u lft's and lipids w/ direct LDL today.  7.  Disposition: Follow-up in 6 months. Awaiting BMP results to decide if to increase telmisartan to 80 mg d/t uncontrolled hypertension. The patient may need nephrology consult depending on repeat BMP.    Murray Hodgkins, NP 03/16/2021, 5:32 PM

## 2021-03-16 NOTE — Patient Instructions (Signed)
Medication Instructions:  No changes at this time.  *If you need a refill on your cardiac medications before your next appointment, please call your pharmacy*   Lab Work: CBC, CMET, Lipid, and Direct LDL  If you have labs (blood work) drawn today and your tests are completely normal, you will receive your results only by: [place] (if you have MyChart) OR A paper copy in the mail If you have any lab test that is abnormal or we need to change your treatment, we will call you to review the results.   Testing/Procedures: None   Follow-Up: At [facility], you and your health needs are our priority.  As part of our continuing mission to provide you with exceptional heart care, we have created designated Provider Care Teams.  These Care Teams include your primary Cardiologist (physician) and Advanced Practice Providers (APPs -  Physician Assistants and Nurse Practitioners) who all work together to provide you with the care you need, when you need it.  We recommend signing up for the patient portal called "MyChart".  Sign up information is provided on this After Visit Summary.  MyChart is used to connect with patients for Virtual Visits (Telemedicine).  Patients are able to view lab/test results, encounter notes, upcoming appointments, etc.  Non-urgent messages can be sent to your provider as well.   To learn more about what you can do with MyChart, go to [email or web address].    Your next appointment:   6 month(s)  The format for your next appointment:   In Person  Provider:   [name], MD or Murray Hodgkins, NP

## 2021-03-17 ENCOUNTER — Telehealth: Payer: Self-pay | Admitting: Physician Assistant

## 2021-03-17 NOTE — Telephone Encounter (Signed)
Call placed to [facility] and pt this AM regarding critical lab = glucose 535  Spoke directly with patient and notified him of his glucose of 535. Recommended he see urgent care or go to the emergency department today, so that his glucose and electrolytes could be rechecked and his DM medications adjusted. Advised him that elevated glucose places him at risk for serious health problems, including electrolyte abnormalities that influence his cardiac health.   He expressed his understanding and appreciation for the call.

## 2021-03-19 LAB — CBC
Hematocrit: 39.8 % (ref 37.5–51.0)
Hemoglobin: 13.6 g/dL (ref 13.0–17.7)
MCH: 33.4 pg — ABNORMAL HIGH (ref 26.6–33.0)
MCHC: 34.2 g/dL (ref 31.5–35.7)
MCV: 98 fL — ABNORMAL HIGH (ref 79–97)
Platelets: 193 10*3/uL (ref 150–450)
RBC: 4.07 x10E6/uL — ABNORMAL LOW (ref 4.14–5.80)
RDW: 12.2 % (ref 11.6–15.4)
WBC: 6.3 10*3/uL (ref 3.4–10.8)

## 2021-03-19 LAB — COMPREHENSIVE METABOLIC PANEL
ALT: 30 IU/L (ref 0–44)
AST: 25 IU/L (ref 0–40)
Albumin/Globulin Ratio: 1.5 (ref 1.2–2.2)
Albumin: 3.8 g/dL (ref 3.8–4.8)
Alkaline Phosphatase: 77 IU/L (ref 44–121)
BUN/Creatinine Ratio: 15 (ref 10–24)
BUN: 38 mg/dL — ABNORMAL HIGH (ref 8–27)
Bilirubin Total: 0.5 mg/dL (ref 0.0–1.2)
CO2: 20 mmol/L (ref 20–29)
Calcium: 9 mg/dL (ref 8.6–10.2)
Chloride: 96 mmol/L (ref 96–106)
Creatinine, Ser: 2.57 mg/dL — ABNORMAL HIGH (ref 0.76–1.27)
Globulin, Total: 2.5 g/dL (ref 1.5–4.5)
Glucose: 535 mg/dL (ref 65–99)
Potassium: 5 mmol/L (ref 3.5–5.2)
Sodium: 131 mmol/L — ABNORMAL LOW (ref 134–144)
Total Protein: 6.3 g/dL (ref 6.0–8.5)
eGFR: 28 mL/min/{1.73_m2} — ABNORMAL LOW (ref >59)

## 2021-03-19 LAB — LDL CHOLESTEROL, DIRECT: LDL Direct: 74 mg/dL (ref 0–99)

## 2021-03-19 LAB — LIPID PANEL
Chol/HDL Ratio: 4.8 ratio (ref 0.0–5.0)
Cholesterol, Total: 173 mg/dL (ref 100–199)
HDL: 36 mg/dL — ABNORMAL LOW (ref >39)
LDL Chol Calc (NIH): 66 mg/dL (ref 0–99)
Triglycerides: 462 mg/dL — ABNORMAL HIGH (ref 0–149)
VLDL Cholesterol Cal: 71 mg/dL — ABNORMAL HIGH (ref 5–40)

## 2021-03-23 ENCOUNTER — Other Ambulatory Visit: Payer: Self-pay | Admitting: Cardiovascular Disease

## 2021-03-23 DIAGNOSIS — I129 Hypertensive chronic kidney disease with stage 1 through stage 4 chronic kidney disease, or unspecified chronic kidney disease: Secondary | ICD-10-CM

## 2021-04-11 ENCOUNTER — Ambulatory Visit: Payer: Medicaid Other | Admitting: Family Medicine

## 2021-04-11 ENCOUNTER — Encounter: Payer: Self-pay | Admitting: Family Medicine

## 2021-04-11 ENCOUNTER — Other Ambulatory Visit: Payer: Self-pay

## 2021-04-11 VITALS — BP 105/64 | HR 57 | Ht 68.0 in | Wt 202.8 lb

## 2021-04-11 DIAGNOSIS — N179 Acute kidney failure, unspecified: Secondary | ICD-10-CM | POA: Diagnosis not present

## 2021-04-11 DIAGNOSIS — E1142 Type 2 diabetes mellitus with diabetic polyneuropathy: Secondary | ICD-10-CM

## 2021-04-11 DIAGNOSIS — N184 Chronic kidney disease, stage 4 (severe): Secondary | ICD-10-CM | POA: Diagnosis not present

## 2021-04-11 DIAGNOSIS — N1832 Chronic kidney disease, stage 3b: Secondary | ICD-10-CM | POA: Diagnosis not present

## 2021-04-11 LAB — BASIC METABOLIC PANEL WITH GFR
BUN/Creatinine Ratio: 21 (calc) (ref 6–22)
BUN: 53 mg/dL — ABNORMAL HIGH (ref 7–25)
CO2: 25 mmol/L (ref 20–32)
Calcium: 8.7 mg/dL (ref 8.6–10.3)
Chloride: 103 mmol/L (ref 98–110)
Creat: 2.49 mg/dL — ABNORMAL HIGH (ref 0.70–1.35)
Glucose, Bld: 151 mg/dL — ABNORMAL HIGH (ref 65–139)
Potassium: 4.4 mmol/L (ref 3.5–5.3)
Sodium: 136 mmol/L (ref 135–146)
eGFR: 29 mL/min/{1.73_m2} — ABNORMAL LOW (ref >=60)

## 2021-04-11 LAB — POCT GLYCOSYLATED HEMOGLOBIN (HGB A1C): Hemoglobin A1C: 10.3 % — AB (ref 4.0–5.6)

## 2021-04-11 NOTE — Assessment & Plan Note (Signed)
Elevated A1c at 10.3, improved from >11 R toe x 2 amputation w/ history of chronic ulcer/osteo No hypoglycemia Complications - Hyperglycemia, CKD-III-IV, peripheral neuropathy, DM retinopathy, DM ulceration Meds - contraindicated Metformin with CKD / Failed Bydureon BCise x 2 trials (nausea) OFF Victoza 1.8 u daily due to preference OFF Metformin due to CHF, CKD  Plan:  1. Continue Lantus 40 unit daily for now, gradual increase if fasting sugar >150 consistently. 2. Encourage improved lifestyle - low carb, low sugar diet, reduce portion size, continue improving regular exercise 3. Check CBG, bring log to next visit for review 4. Continue ASA, ARB, Statin  STAT BMET today for Creatinine / GFR.  Discussed adding SGLT2 as next option. He has declined injectable GLP1 failed in past, cannot take Metformin due to CHF/CKD. I gave him x 2 samples of Farxiga pills '10mg'$  daily, he could initiate therapy with SGLT2 for 2 weeks pending lab result. I will call him later today with advice. If GFR < 30 will advise to hold SGLT2 and we will return to Nephrology  Additionally will anticipate referral to [facility] Endocrinology for further management as well pending lab review today.

## 2021-04-11 NOTE — Progress Notes (Signed)
Subjective:    Patient ID: [name], male    DOB: 1960/09/16, 60 y.o.   MRN: [id number]  [name] is a 60 y.o. male presenting on 04/11/2021 for Diabetes   HPI  CHRONIC DM, Type 2 with DM Neuropathy / CKD-IV Obesity BMI >30 Due for A1c today - Today patient reports doing better on diet since last visit with cards had CBG >500 CBG: avg is variable low as 107 then high 550 Meds: - Lantus 40u daily - did not increase from last visit. - OFF Victoza 1.8 mg inj daily due to preference, unsure if side effect. - OFF Metformin due to CKD. Failed Bydureon in past due to nausea Reports good compliance. Tolerating well w/o side-effects On ARB On Atorvastatin 7m daily. Last labs reviewed Lifestyle: - Diet (Admits improved DM diet) - Exercise (Limited regular exericse) Diabet Eye Exam done yesterday [facility]21 [facility], tingling at times in [facility], worse at night Denies hypoglycemia, polyuria, visual changes   Hypertension / CKD-IV [facility]ley Raring[facility] past, has not been back recently. He remains off Metformin On Telmisartan 450mdaily., Carvedilol 25 BID, Hydralazine Not checking BP. Elevated today in office.    [facility] 01/04/2021 06/21/2020 02/18/2020  Decreased Interest 0 0 0  Down, Depressed, Hopeless 0 0 0  PHQ - 2 Score 0 0 0  Altered sleeping 3 - -  Tired, decreased energy 3 - -  Change in appetite 0 - -  Feeling bad or failure about yourself  0 - -  Trouble concentrating 0 - -  Moving slowly or fidgety/restless 0 - -  Suicidal thoughts 0 - -  PHQ-9 Score 6 - -  Difficult doing work/chores Not difficult at all - -    Social History   Tobacco Use   Smoking status: Never   Smokeless tobacco: Never  Vaping Use   Vaping Use: Never used  Substance Use Topics   Alcohol use: Yes    Comment: occassionally   Drug use: No    Review of Systems Per HPI unless  specifically indicated above     Objective:    BP 105/64   Pulse (!) 57   Ht 5' 8"  (1.727 m)   Wt 202 lb 12.8 oz (92 kg)   SpO2 98%   BMI 30.84 kg/m   Wt Readings from Last 3 Encounters:  04/11/21 202 lb 12.8 oz (92 kg)  03/16/21 208 lb (94.3 kg)  01/04/21 207 lb 12.8 oz (94.3 kg)    Physical Exam Vitals and nursing note reviewed.  Constitutional:      General: He is not in acute distress.    Appearance: He is well-developed. He is not diaphoretic.     Comments: Well-appearing, comfortable, cooperative  HENT:     Head: Normocephalic and atraumatic.  Eyes:     General:        Right eye: No discharge.        Left eye: No discharge.     Conjunctiva/sclera: Conjunctivae normal.  Neck:     Thyroid: No thyromegaly.  Cardiovascular:     Rate and Rhythm: Normal rate and regular rhythm.     Pulses: Normal pulses.     Heart sounds: Normal heart sounds. No murmur heard. Pulmonary:     Effort: Pulmonary effort is normal. No respiratory distress.     Breath sounds: Normal breath sounds. No wheezing  or rales.  Musculoskeletal:        General: Normal range of motion.     Cervical back: Normal range of motion and neck supple.  Lymphadenopathy:     Cervical: No cervical adenopathy.  Skin:    General: Skin is warm and dry.     Findings: No erythema or rash.  Neurological:     Mental Status: He is alert and oriented to person, place, and time. Mental status is at baseline.  Psychiatric:        Behavior: Behavior normal.     Comments: Well groomed, good eye contact, normal speech and thoughts     Results for orders placed or performed in visit on 40/10/27  BASIC METABOLIC PANEL WITH GFR  Result Value Ref Range   Glucose, Bld 151 (H) 65 - 139 mg/dL   BUN 53 (H) 7 - 25 mg/dL   Creat 2.49 (H) 0.70 - 1.35 mg/dL   eGFR 29 (L) > OR = 60 mL/min/1.29m   BUN/Creatinine Ratio 21 6 - 22 (calc)   Sodium 136 135 - 146 mmol/L   Potassium 4.4 3.5 - 5.3 mmol/L   Chloride 103 98 - 110  mmol/L   CO2 25 20 - 32 mmol/L   Calcium 8.7 8.6 - 10.3 mg/dL  POCT HgB A1C  Result Value Ref Range   Hemoglobin A1C 10.3 (A) 4.0 - 5.6 %      Assessment & Plan:   Problem List Items Addressed This Visit     DM type 2 with diabetic peripheral neuropathy ([place] - Primary    Elevated A1c at 10.3, improved from >11 R toe x 2 amputation w/ history of chronic ulcer/osteo No hypoglycemia Complications - Hyperglycemia, CKD-III-IV, peripheral neuropathy, DM retinopathy, DM ulceration Meds - contraindicated Metformin with CKD / Failed Bydureon BCise x 2 trials (nausea) OFF Victoza 1.8 u daily due to preference OFF Metformin due to CHF, CKD  Plan:  1. Continue Lantus 40 unit daily for now, gradual increase if fasting sugar >150 consistently. 2. Encourage improved lifestyle - low carb, low sugar diet, reduce portion size, continue improving regular exercise 3. Check CBG, bring log to next visit for review 4. Continue ASA, ARB, Statin  STAT BMET today for Creatinine / GFR.  Discussed adding SGLT2 as next option. He has declined injectable GLP1 failed in past, cannot take Metformin due to CHF/CKD. I gave him x 2 samples of Farxiga pills 130mdaily, he could initiate therapy with SGLT2 for 2 weeks pending lab result. I will call him later today with advice. If GFR < 30 will advise to hold SGLT2 and we will return to Nephrology  [facility] as well pending lab review today.      Relevant Orders   POCT HgB A1C (Completed)   BASIC METABOLIC PANEL WITH GFR (Completed)   CKD (chronic kidney disease), stage IV ([place]  Relevant Orders   Ambulatory referral to Nephrology   Other Visit Diagnoses     Acute renal failure superimposed on stage 4 chronic kidney disease, unspecified acute renal failure type ([place]      Relevant Orders   BASIC METABOLIC PANEL WITH GFR (Completed)   Ambulatory referral to Nephrology        AoCKD IIIb vs IV  See above A&P Last Cr up to 2.57 and GFR 28 1 month ago 03/16/21 Check STAT BMET today, follow Cr / GFR if progression then will refer back  to Dr  Marland KitchenUpdate 110pm**  Reviewed STAT BMET lab results above. Improved glucose. Elevated Creatinine still 1 month later now and GFR 29 or (< 30 still) likely CKD IV  Referral sent back to Nephrology [facility] Dr Holley Raring, call to patient to schedule and also he should HOLD Farxiga 55m daily sample for now based on his kidney function.  Defer med management to Nephrology for CKD  Orders Placed This Encounter  Procedures   BASIC METABOLIC PANEL WITH GFR   Ambulatory referral to Nephrology    Referral Priority:   Routine    Referral Type:   Consultation    Referral Reason:   Specialty Services Required    Requested Specialty:   Nephrology    Number of Visits Requested:   1   POCT HgB A1C     No orders of the defined types were placed in this encounter.    Follow up plan: Return in about 3 months (around 07/11/2021) for 3 month follow-up DM, CKD updates.   ANobie Putnam [facility] 04/11/2021, 8:15 AM

## 2021-04-11 NOTE — Patient Instructions (Addendum)
Thank you for coming to the office today.  Lab today stay tuned, will call you later today with result and if we need to start a new medication.   Endocrinology (Diabetes doctor)  [facility] [address] [place], [place]  [address] Phone: [phone]   --------  Dr [facility]) [address] [place], [place]  [address]  Phone: [phone]    Please schedule a Follow-up Appointment to: Return in about 3 months (around 07/11/2021) for 3 month follow-up DM, CKD updates.  If you have any other questions or concerns, please feel free to call the office or send a message through [place]. You may also schedule an earlier appointment if necessary.  Additionally, you may be receiving a survey about your experience at our office within a few days to 1 week by e-mail or mail. We value your feedback.  [name], DO [place]

## 2021-04-25 ENCOUNTER — Other Ambulatory Visit: Payer: Self-pay | Admitting: Family Medicine

## 2021-04-25 DIAGNOSIS — E1142 Type 2 diabetes mellitus with diabetic polyneuropathy: Secondary | ICD-10-CM

## 2021-04-25 DIAGNOSIS — N189 Chronic kidney disease, unspecified: Secondary | ICD-10-CM | POA: Insufficient documentation

## 2021-04-26 ENCOUNTER — Other Ambulatory Visit: Payer: Self-pay | Admitting: Nephrology

## 2021-04-26 DIAGNOSIS — N184 Chronic kidney disease, stage 4 (severe): Secondary | ICD-10-CM | POA: Diagnosis not present

## 2021-04-26 DIAGNOSIS — R600 Localized edema: Secondary | ICD-10-CM | POA: Diagnosis not present

## 2021-04-26 DIAGNOSIS — E1122 Type 2 diabetes mellitus with diabetic chronic kidney disease: Secondary | ICD-10-CM

## 2021-04-26 DIAGNOSIS — I10 Essential (primary) hypertension: Secondary | ICD-10-CM | POA: Diagnosis not present

## 2021-04-26 NOTE — Telephone Encounter (Signed)
Requested Prescriptions  Pending Prescriptions Disp Refills  . LANTUS SOLOSTAR 100 UNIT/ML Solostar Pen [Pharmacy Med Name: LANTUS SOLOSTAR 100 UNIT/ML] 15 mL 2    Sig: INJECT 40 UNITS INTO THE SKIN AT BEDTIME.     Endocrinology:  Diabetes - Insulins Failed - 04/25/2021  5:33 PM      Failed - HBA1C is between 0 and 7.9 and within 180 days    Hemoglobin A1C  Date Value Ref Range Status  04/11/2021 10.3 (A) 4.0 - 5.6 % Final  04/01/2019 7.7  Final    Comment:    [facility]   Hgb A1c MFr Bld  Date Value Ref Range Status  02/15/2020 8.4 (H) <5.7 % of total Hgb Final    Comment:    For someone without known diabetes, a hemoglobin A1c value of 6.5% or greater indicates that they may have  diabetes and this should be confirmed with a follow-up  test. . For someone with known diabetes, a value <7% indicates  that their diabetes is well controlled and a value  greater than or equal to 7% indicates suboptimal  control. A1c targets should be individualized based on  duration of diabetes, age, comorbid conditions, and  other considerations. . Currently, no consensus exists regarding use of hemoglobin A1c for diagnosis of diabetes for children. Renella Cunas - Valid encounter within last 6 months    Recent Outpatient Visits          2 weeks ago DM type 2 with diabetic peripheral neuropathy [facility])   [place], DO   3 months ago DM type 2 with diabetic peripheral neuropathy [facility])   [place], DO   7 months ago DM type 2 with diabetic peripheral neuropathy [facility])   [place], DO   10 months ago DM type 2 with diabetic peripheral neuropathy [facility])   [facility] Olin Hauser, DO   1 year ago DM type 2 with diabetic chronic skin ulcer ([place])   [facility] Parks Ranger, Devonne Doughty, DO      Future  Appointments            In 2 months Parks Ranger, Devonne Doughty, [facility], [place]   In 5 months Fletcher Anon, Mertie Clause, MD [facility], [place]

## 2021-05-02 ENCOUNTER — Ambulatory Visit
Admission: RE | Admit: 2021-05-02 | Discharge: 2021-05-02 | Disposition: A | Discharge status: Home / Self Care | Payer: Medicaid Other | Source: Ambulatory Visit | Attending: Nephrology | Admitting: Nephrology

## 2021-05-02 ENCOUNTER — Other Ambulatory Visit: Payer: Self-pay

## 2021-05-02 DIAGNOSIS — E1122 Type 2 diabetes mellitus with diabetic chronic kidney disease: Secondary | ICD-10-CM | POA: Insufficient documentation

## 2021-05-02 DIAGNOSIS — N184 Chronic kidney disease, stage 4 (severe): Secondary | ICD-10-CM | POA: Insufficient documentation

## 2021-05-02 DIAGNOSIS — N189 Chronic kidney disease, unspecified: Secondary | ICD-10-CM | POA: Diagnosis not present

## 2021-05-03 DIAGNOSIS — E113299 Type 2 diabetes mellitus with mild nonproliferative diabetic retinopathy without macular edema, unspecified eye: Secondary | ICD-10-CM | POA: Insufficient documentation

## 2021-05-03 DIAGNOSIS — E119 Type 2 diabetes mellitus without complications: Secondary | ICD-10-CM | POA: Insufficient documentation

## 2021-05-03 LAB — HM DIABETES EYE EXAM

## 2021-05-26 ENCOUNTER — Other Ambulatory Visit: Payer: Self-pay | Admitting: Family Medicine

## 2021-05-26 DIAGNOSIS — E1142 Type 2 diabetes mellitus with diabetic polyneuropathy: Secondary | ICD-10-CM

## 2021-05-27 NOTE — Telephone Encounter (Signed)
Requested Prescriptions  Pending Prescriptions Disp Refills  . B-D ULTRAFINE III SHORT PEN 31G X 8 MM MISC [Pharmacy Med Name: BD UF SHORT PEN NEEDLE [id number]] 100 each 3    Sig: USE TO INJECT INSULIN NIGHTLY     Endocrinology: Diabetes - Testing Supplies Passed - 05/26/2021  1:20 PM      Passed - Valid encounter within last 12 months    Recent Outpatient Visits          1 month ago DM type 2 with diabetic peripheral neuropathy [facility])   [place], DO   4 months ago DM type 2 with diabetic peripheral neuropathy [facility])   [place], DO   8 months ago DM type 2 with diabetic peripheral neuropathy [facility])   [facility], Devonne Doughty, DO   11 months ago DM type 2 with diabetic peripheral neuropathy [facility])   [facility] Olin Hauser, DO   1 year ago DM type 2 with diabetic chronic skin ulcer ([place])   [facility] Parks Ranger, Devonne Doughty, DO      Future Appointments            In 1 month Parks Ranger, Devonne Doughty, [facility], [place]   In 4 months Fletcher Anon, Mertie Clause, MD [facility], [place]

## 2021-06-17 ENCOUNTER — Other Ambulatory Visit: Payer: Self-pay | Admitting: Family Medicine

## 2021-06-17 DIAGNOSIS — E1142 Type 2 diabetes mellitus with diabetic polyneuropathy: Secondary | ICD-10-CM

## 2021-06-18 NOTE — Telephone Encounter (Signed)
Requested Prescriptions  Pending Prescriptions Disp Refills  . gabapentin (NEURONTIN) 400 MG capsule [Pharmacy Med Name: GABAPENTIN 400 MG CAPSULE] 270 capsule 1    Sig: TAKE 2 TO 3 CAPSULES BY MOUTH 3 TIMES DAILY.     Neurology: Anticonvulsants - gabapentin Passed - 06/17/2021  4:44 PM      Passed - Valid encounter within last 12 months    Recent Outpatient Visits          2 months ago DM type 2 with diabetic peripheral neuropathy [facility])   [place], DO   5 months ago DM type 2 with diabetic peripheral neuropathy [facility])   [place], DO   8 months ago DM type 2 with diabetic peripheral neuropathy [facility])   [facility], Devonne Doughty, DO   12 months ago DM type 2 with diabetic peripheral neuropathy [facility])   [facility] Olin Hauser, DO   1 year ago DM type 2 with diabetic chronic skin ulcer ([place])   [facility] Olin Hauser, DO      Future Appointments            In 1 month Parks Ranger, Devonne Doughty, [facility], [place]   In 3 months Fletcher Anon, Mertie Clause, MD [facility], [place]

## 2021-07-04 DIAGNOSIS — N2581 Secondary hyperparathyroidism of renal origin: Secondary | ICD-10-CM | POA: Diagnosis not present

## 2021-07-04 DIAGNOSIS — D631 Anemia in chronic kidney disease: Secondary | ICD-10-CM | POA: Diagnosis not present

## 2021-07-04 DIAGNOSIS — E1122 Type 2 diabetes mellitus with diabetic chronic kidney disease: Secondary | ICD-10-CM | POA: Diagnosis not present

## 2021-07-04 DIAGNOSIS — I5022 Chronic systolic (congestive) heart failure: Secondary | ICD-10-CM | POA: Diagnosis not present

## 2021-07-04 DIAGNOSIS — N184 Chronic kidney disease, stage 4 (severe): Secondary | ICD-10-CM | POA: Diagnosis not present

## 2021-07-04 DIAGNOSIS — I10 Essential (primary) hypertension: Secondary | ICD-10-CM | POA: Diagnosis not present

## 2021-07-18 ENCOUNTER — Ambulatory Visit: Payer: Medicaid Other | Admitting: Family Medicine

## 2021-07-18 ENCOUNTER — Encounter: Payer: Self-pay | Admitting: Family Medicine

## 2021-07-18 ENCOUNTER — Other Ambulatory Visit: Payer: Self-pay

## 2021-07-18 VITALS — BP 136/70 | HR 62 | Ht 67.0 in | Wt 206.6 lb

## 2021-07-18 DIAGNOSIS — N184 Chronic kidney disease, stage 4 (severe): Secondary | ICD-10-CM | POA: Diagnosis not present

## 2021-07-18 DIAGNOSIS — E785 Hyperlipidemia, unspecified: Secondary | ICD-10-CM | POA: Diagnosis not present

## 2021-07-18 DIAGNOSIS — S98131D Complete traumatic amputation of one right lesser toe, subsequent encounter: Secondary | ICD-10-CM

## 2021-07-18 DIAGNOSIS — E1142 Type 2 diabetes mellitus with diabetic polyneuropathy: Secondary | ICD-10-CM

## 2021-07-18 DIAGNOSIS — I5042 Chronic combined systolic (congestive) and diastolic (congestive) heart failure: Secondary | ICD-10-CM

## 2021-07-18 DIAGNOSIS — I129 Hypertensive chronic kidney disease with stage 1 through stage 4 chronic kidney disease, or unspecified chronic kidney disease: Secondary | ICD-10-CM | POA: Diagnosis not present

## 2021-07-18 DIAGNOSIS — E1169 Type 2 diabetes mellitus with other specified complication: Secondary | ICD-10-CM | POA: Diagnosis not present

## 2021-07-18 LAB — POCT GLYCOSYLATED HEMOGLOBIN (HGB A1C): Hemoglobin A1C: 8.9 % — AB (ref 4.0–5.6)

## 2021-07-18 MED ORDER — JARDIANCE 10 MG PO TABS: 10.0000 mg | ORAL_TABLET | Freq: Every day | ORAL | 1 refills | Status: DC

## 2021-07-18 MED ORDER — TELMISARTAN 40 MG PO TABS: 40.0000 mg | ORAL_TABLET | Freq: Every day | ORAL | 1 refills | Status: DC

## 2021-07-18 NOTE — Assessment & Plan Note (Signed)
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

## 2021-07-18 NOTE — Assessment & Plan Note (Signed)
Clinically stable, euvolemic today CHF with history of reduced EF 47-20% Complication with CKD-III-IV  Plan Continue current med management per Cardiology Reviewed diet avoid high sodium foods Add SGLT2 Jardiance per Nephrology recs

## 2021-07-18 NOTE — Assessment & Plan Note (Signed)
Controlled LDL, high TG Calculated ASCVD 10 yr risk score elevated with DM, CAD  Plan: 1. Continue Atorvastatin 40mg  daily 2. Continue Plavix, ASA 81mg  for secondary ASCVD risk reduction 3. Encourage improved lifestyle - low carb/cholesterol, reduce portion size, continue improving regular exercise

## 2021-07-18 NOTE — Progress Notes (Signed)
Subjective:    Patient ID: [name], male    DOB: 1961-03-01, 61 y.o.   MRN: [id number]  [name] is a 61 y.o. male presenting on 07/18/2021 for Diabetes and Chronic Kidney Disease   HPI  CHRONIC DM, Type 2 with DM Neuropathy / CKD-IV Obesity BMI >30 Hyperlipidemia Due for A1c today Improved lifestyle diet CBG: improved results Meds: - Lantus 40u daily - Previously ordered Jardiance by Nephrology but did not receive medication - OFF Metformin due to CKD. Failed Bydureon in past due to nausea Reports good compliance. Tolerating well w/o side-effects On ARB On Atorvastatin 40mg  daily. Last labs reviewed Lifestyle: - Diet (Admits improved DM diet) - Exercise (Limited regular exericse) Diabet Eye Exam done 05/03/21 [facility]  Admits intermittent burning, tingling at times in feet, worse at night Denies hypoglycemia, polyuria, visual changes   Hypertension / CKD-IV CHF, Diastolic, Systolic Combined He has been followed by [facility] Dr Holley Raring recommended SGLT2 for CHF and DM/HTN He remains off Metformin On Telmisartan 40mg  daily., Carvedilol 25 BID, Hydralazine   Health Maintenance: Declines PNA Vaccine, Shingrix  Depression screen [facility] 2/9 01/04/2021 06/21/2020 02/18/2020  Decreased Interest 0 0 0  Down, Depressed, Hopeless 0 0 0  PHQ - 2 Score 0 0 0  Altered sleeping 3 - -  Tired, decreased energy 3 - -  Change in appetite 0 - -  Feeling bad or failure about yourself  0 - -  Trouble concentrating 0 - -  Moving slowly or fidgety/restless 0 - -  Suicidal thoughts 0 - -  PHQ-9 Score 6 - -  Difficult doing work/chores Not difficult at all - -    Social History   Tobacco Use   Smoking status: Never   Smokeless tobacco: Never  Vaping Use   Vaping Use: Never used  Substance Use Topics   Alcohol use: Yes    Comment: occassionally   Drug use: No    Review of Systems Per HPI unless specifically indicated above     Objective:    BP 136/70     Pulse 62    Ht 5\' 7"  (1.702 m)    Wt 206 lb 9.6 oz (93.7 kg)    SpO2 97%    BMI 32.36 kg/m   Wt Readings from Last 3 Encounters:  07/18/21 206 lb 9.6 oz (93.7 kg)  04/11/21 202 lb 12.8 oz (92 kg)  03/16/21 208 lb (94.3 kg)    Physical Exam Vitals and nursing note reviewed.  Constitutional:      General: He is not in acute distress.    Appearance: He is well-developed. He is not diaphoretic.     Comments: Well-appearing, comfortable, cooperative  HENT:     Head: Normocephalic and atraumatic.  Eyes:     General:        Right eye: No discharge.        Left eye: No discharge.     Conjunctiva/sclera: Conjunctivae normal.  Neck:     Thyroid: No thyromegaly.  Cardiovascular:     Rate and Rhythm: Normal rate and regular rhythm.     Pulses: Normal pulses.     Heart sounds: Normal heart sounds. No murmur heard. Pulmonary:     Effort: Pulmonary effort is normal. No respiratory distress.     Breath sounds: Normal breath sounds. No wheezing or rales.  Musculoskeletal:        General: Normal range of motion.     Cervical back: Normal  range of motion and neck supple.     Comments: S/p R 5th toe amputation healed  Lymphadenopathy:     Cervical: No cervical adenopathy.  Skin:    General: Skin is warm and dry.     Findings: No erythema or rash.  Neurological:     Mental Status: He is alert and oriented to person, place, and time. Mental status is at baseline.  Psychiatric:        Behavior: Behavior normal.     Comments: Well groomed, good eye contact, normal speech and thoughts     Results for orders placed or performed in visit on 07/18/21  POCT HgB A1C  Result Value Ref Range   Hemoglobin A1C 8.9 (A) 4.0 - 5.6 %  HM DIABETES EYE EXAM  Result Value Ref Range   HM Diabetic Eye Exam Retinopathy (A) No Retinopathy      Assessment & Plan:   Problem List Items Addressed This Visit     Hyperlipidemia associated with type 2 diabetes mellitus ([place])    Controlled LDL, high  TG Calculated ASCVD 10 yr risk score elevated with DM, CAD  Plan: 1. Continue Atorvastatin 40mg  daily 2. Continue Plavix, ASA 81mg  for secondary ASCVD risk reduction 3. Encourage improved lifestyle - low carb/cholesterol, reduce portion size, continue improving regular exercise      Relevant Medications   telmisartan (MICARDIS) 40 MG tablet   JARDIANCE 10 MG TABS tablet   DM type 2 with diabetic peripheral neuropathy (HCC) - Primary    Improving A1c to 8.9 (prior 10-11) R toe x 2 amputation w/ history of chronic ulcer/osteo No hypoglycemia Complications - Hyperglycemia, CKD-III-IV, peripheral neuropathy, DM retinopathy, DM ulceration Meds - contraindicated Metformin with CKD / Failed Bydureon BCise x 2 trials (nausea) OFF Victoza 1.8 u daily due to preference OFF Metformin due to CHF, CKD  Plan:  - Re order Jardiance 10mg  daily, per Nephrology recs  1. Continue Lantus 40 unit daily for now, gradual increase if fasting sugar >150 consistently. 2. Encourage improved lifestyle - low carb, low sugar diet, reduce portion size, continue improving regular exercise 3. Check CBG, bring log to next visit for review 4. Continue ASA, ARB, Statin  Awaiting initial apt w/ [facility]      Relevant Medications   telmisartan (MICARDIS) 40 MG tablet   JARDIANCE 10 MG TABS tablet   Other Relevant Orders   POCT HgB A1C (Completed)   CKD (chronic kidney disease), stage IV (HCC)    Stable reduced renal function CKD IV currently with reduced GFR Followed by Nephrology [facility] Dr Holley Raring  New restart Jardiance SGLT2 On ARB Avoid metformin Caution excessive fluid / sodium intake      Chronic combined systolic (congestive) and diastolic (congestive) heart failure (HCC)    Clinically stable, euvolemic today CHF with history of reduced EF 57-32% Complication with CKD-III-IV  Plan Continue current med management per Cardiology Reviewed diet avoid high sodium foods Add SGLT2  Jardiance per Nephrology recs      Relevant Medications   telmisartan (MICARDIS) 40 MG tablet   Benign hypertension with CKD (chronic kidney disease) stage IV (HCC)    Improved BP in range Complication with CKD-IIV worsening, CAD    Plan:  - Note add Jardiance 10mg  daily to regimen per Nephrology can help BP as well  1. Continue current BP regimen - Carvedilol 25mg  BID, Hydralazine 100mg  q 8 hr, isosorbide mononitrate imdur 90mg  - Re order Telmisartan 40mg   2. Encourage improved  lifestyle - low sodium diet, regular exercise 3. Continue monitor BP outside office, bring readings to next visit, if persistently >140/90 or new symptoms notify office sooner      Relevant Medications   telmisartan (MICARDIS) 40 MG tablet   Amputation of little toe (HCC)    S/p amputation 5th toe R foot       Meds ordered this encounter  Medications   telmisartan (MICARDIS) 40 MG tablet    Sig: Take 1 tablet (40 mg total) by mouth daily.    Dispense:  90 tablet    Refill:  1   JARDIANCE 10 MG TABS tablet    Sig: Take 1 tablet (10 mg total) by mouth daily before breakfast.    Dispense:  90 tablet    Refill:  1      Follow up plan: Return in about 7 months (around 02/15/2022) for 7 month follow-up Annual Physical AM apt fasting lab AFTER.   [name], [facility] 07/18/2021, 8:09 AM

## 2021-07-18 NOTE — Assessment & Plan Note (Signed)
Stable reduced renal function CKD IV currently with reduced GFR Followed by Nephrology [facility] Dr Holley Raring  New restart Jardiance SGLT2 On ARB Avoid metformin Caution excessive fluid / sodium intake

## 2021-07-18 NOTE — Assessment & Plan Note (Signed)
Improving A1c to 8.9 (prior 10-11) R toe x 2 amputation w/ history of chronic ulcer/osteo No hypoglycemia Complications - Hyperglycemia, CKD-III-IV, peripheral neuropathy, DM retinopathy, DM ulceration Meds - contraindicated Metformin with CKD / Failed Bydureon BCise x 2 trials (nausea) OFF Victoza 1.8 u daily due to preference OFF Metformin due to CHF, CKD  Plan:  - Re order Jardiance 10mg  daily, per Nephrology recs  1. Continue Lantus 40 unit daily for now, gradual increase if fasting sugar >150 consistently. 2. Encourage improved lifestyle - low carb, low sugar diet, reduce portion size, continue improving regular exercise 3. Check CBG, bring log to next visit for review 4. Continue ASA, ARB, Statin  Awaiting initial apt w/ [facility]

## 2021-07-18 NOTE — Assessment & Plan Note (Signed)
Improved BP in range Complication with CKD-IIV worsening, CAD    Plan:  - Note add Jardiance 10mg  daily to regimen per Nephrology can help BP as well  1. Continue current BP regimen - Carvedilol 25mg  BID, Hydralazine 100mg  q 8 hr, isosorbide mononitrate imdur 90mg  - Re order Telmisartan 40mg   2. Encourage improved lifestyle - low sodium diet, regular exercise 3. Continue monitor BP outside office, bring readings to next visit, if persistently >140/90 or new symptoms notify office sooner

## 2021-07-18 NOTE — Patient Instructions (Addendum)
Thank you for coming to the office today.  Ordered Jardiance 10mg  - may urinate more often. If develop infection - burning with urination or other concerns return for eval.  Re ordered Telmisartan today  Keep on other meds  Recent Labs    01/04/21 0813 04/11/21 0817 07/18/21 0811  HGBA1C 11.6* 10.3* 8.9*    Keep up the great work.   Please schedule a Follow-up Appointment to: Return in about 7 months (around 02/15/2022) for 7 month follow-up Annual Physical AM apt fasting lab AFTER.  If you have any other questions or concerns, please feel free to call the office or send a message through [place]. You may also schedule an earlier appointment if necessary.  Additionally, you may be receiving a survey about your experience at our office within a few days to 1 week by e-mail or mail. We value your feedback.  [name], DO [place]

## 2021-08-05 ENCOUNTER — Other Ambulatory Visit: Payer: Self-pay | Admitting: Family Medicine

## 2021-08-05 DIAGNOSIS — E1142 Type 2 diabetes mellitus with diabetic polyneuropathy: Secondary | ICD-10-CM

## 2021-08-05 NOTE — Telephone Encounter (Signed)
Requested Prescriptions  Pending Prescriptions Disp Refills   LANTUS SOLOSTAR 100 UNIT/ML Solostar Pen [Pharmacy Med Name: LANTUS SOLOSTAR 100 UNIT/ML] 15 mL 2    Sig: INJECT 40 UNITS INTO THE SKIN AT BEDTIME.     Endocrinology:  Diabetes - Insulins Failed - 08/05/2021  3:08 PM      Failed - HBA1C is between 0 and 7.9 and within 180 days    Hemoglobin A1C  Date Value Ref Range Status  07/18/2021 8.9 (A) 4.0 - 5.6 % Final  04/01/2019 7.7  Final    Comment:    [facility]   Hgb A1c MFr Bld  Date Value Ref Range Status  02/15/2020 8.4 (H) <5.7 % of total Hgb Final    Comment:    For someone without known diabetes, a hemoglobin A1c value of 6.5% or greater indicates that they may have  diabetes and this should be confirmed with a follow-up  test. . For someone with known diabetes, a value <7% indicates  that their diabetes is well controlled and a value  greater than or equal to 7% indicates suboptimal  control. A1c targets should be individualized based on  duration of diabetes, age, comorbid conditions, and  other considerations. . Currently, no consensus exists regarding use of hemoglobin A1c for diagnosis of diabetes for children. Renella Cunas - Valid encounter within last 6 months    Recent Outpatient Visits          2 weeks ago DM type 2 with diabetic peripheral neuropathy [facility])   [place], DO   3 months ago DM type 2 with diabetic peripheral neuropathy [facility])   [place], DO   7 months ago DM type 2 with diabetic peripheral neuropathy [facility])   [place], DO   10 months ago DM type 2 with diabetic peripheral neuropathy [facility])   [facility] Olin Hauser, DO   1 year ago DM type 2 with diabetic peripheral neuropathy [facility])   [facility] Olin Hauser, DO      Future  Appointments            In 1 month Fletcher Anon, Mertie Clause, MD [facility], [place]   In 5 months Parks Ranger, Devonne Doughty, [facility], [facility]

## 2021-09-24 ENCOUNTER — Other Ambulatory Visit: Payer: Self-pay | Admitting: Family Medicine

## 2021-09-24 DIAGNOSIS — E1142 Type 2 diabetes mellitus with diabetic polyneuropathy: Secondary | ICD-10-CM

## 2021-09-25 NOTE — Telephone Encounter (Signed)
Requested Prescriptions  ?Pending Prescriptions Disp Refills  ?? gabapentin (NEURONTIN) 400 MG capsule [Pharmacy Med Name: GABAPENTIN 400 MG CAPSULE] 270 capsule 1  ?  Sig: TAKE 2 TO 3 CAPSULES BY MOUTH 3 TIMES DAILY.  ?  ? Neurology: Anticonvulsants - gabapentin Failed - 09/24/2021 12:11 PM  ?  ?  Failed - Cr in normal range and within 360 days  ?  Creat  ?Date Value Ref Range Status  ?04/11/2021 2.49 (H) 0.70 - 1.35 mg/dL Final  ? ?Creatinine, POC  ?Date Value Ref Range Status  ?05/15/2017 0 mg/dL Final  ?   ?  ?  Passed - Completed PHQ-2 or PHQ-9 in the last 360 days  ?  ?  Passed - Valid encounter within last 12 months  ?  Recent Outpatient Visits   ?      ? 2 months ago DM type 2 with diabetic peripheral neuropathy ([place])  ? [place], DO  ? 5 months ago DM type 2 with diabetic peripheral neuropathy [facility])  ? [place], DO  ? 8 months ago DM type 2 with diabetic peripheral neuropathy [facility])  ? [place], DO  ? 1 year ago DM type 2 with diabetic peripheral neuropathy [facility])  ? [place], DO  ? 1 year ago DM type 2 with diabetic peripheral neuropathy [facility])  ? [place], DO  ?  ?  ?Future Appointments   ?        ? In 2 days Wellington Hampshire, MD [facility], [facility]  ? In 3 months Parks Ranger, Devonne Doughty, DO [facility], [place]  ?  ? ?  ?  ?  ? ?

## 2021-09-27 ENCOUNTER — Ambulatory Visit: Payer: Medicaid Other | Admitting: Cardiovascular Disease

## 2021-09-27 NOTE — Progress Notes (Deleted)
?  ?Cardiology Office Note ? ? ?Date:  09/27/2021  ? ?ID:  [name], DOB [birth year], MRN [id number] ? ?PCP:  Olin Hauser, DO  ?Cardiologist:   [name], MD  ? ?No chief complaint on file. ? ? ?  ?History of Present Illness: ?[name] is a 61 y.o. male who presents for a follow-up visit regarding chronic systolic heart failure and coronary artery disease. ?He has past medical history of CAD, mixed ischemic and nonischemic cardiomyopathy, diabetes, hypertension, hyperlipidemia, and stage III chronic kidney disease.  He is status post drug-eluting stent placement to the LAD in April 2018.  At that time, he was found to have severe LV dysfunction with an EF of 25 to 30% and diffuse hypokinesis which was felt to be out of proportion to the LAD disease.  He also has a history of alcohol abuse.  A follow-up echocardiogram in July 2018 showed improved LV function with an EF of 40 to 45%.   ? ?Most recent echo in February 2019  showed stable LV dysfunction with an EF of 40 to 45% and grade 1 diastolic dysfunction.  ?Amlodipine was discontinued due to leg edema.   ? ?He had previous toe amputation but no history of peripheral arterial disease.  He has chronic kidney disease with most recent GFR of 25.  He follows with nephrology.  He has been doing reasonably well and denies chest pain or worsening dyspnea.  No significant leg edema and no claudication. ? ?Past Medical History:  ?Diagnosis Date  ? Ankle pain   ? Chronic combined systolic (congestive) and diastolic (congestive) heart failure ([place]) 10/2016  ? a. 10/2016 Echo: EF 25-30%, diff HK; b. 01/2017 Echo:  EF 40-45%, GrI DD; c. 08/2017 Echo: EF 40-45%, GrI DD; d. 08/2020 Echo: EF 45-50%, glob HK. Mod asymm LVH, GrI DD, Nl RV size/fxn; e. 08/2020 PYP: equivocal for ATTR cardiac amyloid.  ? CKD (chronic kidney disease), stage III - IV ([place])   ? Coronary artery disease   ? a. 10/2016 Cath/PCI: LAD 67m(3.5x15 Xience Alpine DES). No other  obstructive disease.  ? Diabetic neuropathy [place]   ? Hyperlipidemia   ? Hypertension   ? 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 a. 10/2016 Echo: EF 25-30%, diff HK; b. 01/2017 Echo:  EF 40-45%, GrI DD; c. 08/2017 Echo: EF 40-45%, GrI DD; d. 08/2020 Echo: EF 45-50%, glob HK. Mod asymm LVH, GrI DD, Nl RV size/fxn.  ? Pain in both feet   ? ? ?Past Surgical History:  ?Procedure Laterality Date  ? CARDIAC CATHETERIZATION    ? CORONARY STENT INTERVENTION N/A 10/21/2016  ? Procedure: Coronary Stent Intervention;  Surgeon: MWellington Hampshire MD;  [place];  Service: Cardiovascular;  Laterality: N/A;  ? LEFT HEART CATH AND CORONARY ANGIOGRAPHY N/A 10/21/2016  ? Procedure: Left Heart Cath and Coronary Angiography;  Surgeon: MWellington Hampshire MD;  [place];  Service: Cardiovascular;  Laterality: N/A;  ? TIBIA FRACTURE SURGERY Right 2002  ? ? ? ?Current Outpatient Medications  ?Medication Sig Dispense Refill  ? ACCU-CHEK FASTCLIX LANCETS MISC Check sugar up to 3 x daily as instructed 102 each 12  ? ACCU-CHEK GUIDE test strip Check blood sugar up to 3 times daily as advised 100 each 12  ? acetaminophen (TYLENOL 8 HOUR) 650 MG CR tablet Take 1 tablet (650 mg total) by mouth every 8 (eight) hours as needed for pain.    ?  aspirin EC 81 MG tablet Take 81 mg by mouth.     ? atorvastatin (LIPITOR) 40 MG tablet Take 1 tablet (40 mg total) by mouth daily. 90 tablet 3  ? b complex vitamins tablet Take 1 tablet by mouth daily.    ? B-D ULTRAFINE III SHORT PEN 31G X 8 MM MISC USE TO INJECT INSULIN NIGHTLY 100 each 3  ? blood glucose meter kit and supplies KIT Dispense brand based on patient and insurance preference. Use up to 3 times daily to check blood sugar. DX: ICD 10 E11.9 Diabetes Type 2. 1 each 0  ? Blood Pressure Monitoring (BLOOD PRESSURE CUFF) MISC 1 each by Does not apply route daily. 1 each 0  ? carvedilol (COREG) 25 MG tablet Take 1 tablet (25 mg total) by mouth 2 (two)  times daily with a meal. 180 tablet 3  ? furosemide (LASIX) 20 MG tablet TAKE 0.5 TABLETS (10 MG TOTAL) BY MOUTH DAILY. PLEASE KEEP YOUR UPCOMING APPOINTMENT FOR REFILLS. 15 tablet 3  ? gabapentin (NEURONTIN) 400 MG capsule TAKE 2 TO 3 CAPSULES BY MOUTH 3 TIMES DAILY. 270 capsule 1  ? hydrALAZINE (APRESOLINE) 100 MG tablet Take 1 tablet (100 mg total) by mouth 3 (three) times daily. 270 tablet 3  ? isosorbide mononitrate (IMDUR) 60 MG 24 hr tablet Take 1.5 tablets (90 mg total) by mouth daily. 135 tablet 3  ? JARDIANCE 10 MG TABS tablet Take 1 tablet (10 mg total) by mouth daily before breakfast. 90 tablet 1  ? Lancets Misc. MISC Use  Brand compatable to insurance and monitor to check blood sugar up to 3 times daily. ICD10 E11.9 100 each 12  ? LANTUS SOLOSTAR 100 UNIT/ML Solostar Pen INJECT 40 UNITS INTO THE SKIN AT BEDTIME. 15 mL 2  ? telmisartan (MICARDIS) 40 MG tablet Take 1 tablet (40 mg total) by mouth daily. 90 tablet 1  ? ?No current facility-administered medications for this visit.  ? ? ?Allergies:   Sulfamethoxazole-trimethoprim, Amlodipine, Lisinopril, and Terbinafine and related  ? ? ?Social History:  The patient  reports that he has never smoked. He has never used smokeless tobacco. He reports current alcohol use. He reports that he does not use drugs.  ? ?Family History:  The patient's family history includes Heart disease in his maternal grandfather; Hypertension in his mother.  ? ? ?ROS:  Please see the history of present illness.   Otherwise, review of systems are positive for none.   All other systems are reviewed and negative.  ? ? ?PHYSICAL EXAM: ?VS:  There were no vitals taken for this visit. , BMI There is no height or weight on file to calculate BMI. ?GEN: Well nourished, well developed, in no acute distress  ?HEENT: normal  ?Neck: Jugular venous pressure is not well visualized, carotid bruits, or masses ?Cardiac: RRR; no murmurs, rubs, or gallops, trace bilateral leg edema worse on the right  side ?Respiratory:  clear to auscultation bilaterally, normal work of breathing ?GI: soft, nontender, nondistended, + BS ?MS: no deformity or atrophy  ?Skin: warm and dry, no rash ?Neuro:  Strength and sensation are intact ?Psych: euthymic mood, full affect ? ? ?EKG:  EKG is  ordered today. ?EKG showed normal sinus rhythm with left bundle branch block. ? ? ?Recent Labs: ?03/16/2021: ALT 30; Hemoglobin 13.6; Platelets 193 ?04/11/2021: BUN 53; Creat 2.49; Potassium 4.4; Sodium 136  ? ? ?Lipid Panel ?   ?Component Value Date/Time  ? CHOL 173 03/16/2021 1503  ?  TRIG 462 (H) 03/16/2021 1503  ? HDL 36 (L) 03/16/2021 1503  ? CHOLHDL 4.8 03/16/2021 1503  ? CHOLHDL 3.4 02/15/2020 0804  ? VLDL 34 10/20/2016 0515  ? LDLCALC 66 03/16/2021 1503  ? [place] 77 02/15/2020 0804  ? LDLDIRECT 74 03/16/2021 1503  ? ?  ? ?Wt Readings from Last 3 Encounters:  ?07/18/21 206 lb 9.6 oz (93.7 kg)  ?04/11/21 202 lb 12.8 oz (92 kg)  ?03/16/21 208 lb (94.3 kg)  ?  ? ? ? ?PAD Screen 10/28/2016  ?Previous PAD dx? No  ?Previous surgical procedure? No  ?Pain with walking? No  ?Feet/toe relief with dangling? No  ?Painful, non-healing ulcers? No  ?Extremities discolored? No  ? ? ? ? ?ASSESSMENT AND PLAN: ? ?1.  Coronary artery disease: Status post LAD stenting in April 2018.  He has done well since then without any recurrent chest pain or dyspnea.      Continue aspirin indefinitely. ? ?2.  Chronic systolic heart failure due to Ischemic cardiomyopathy/HFrEF: Most recent ejection fraction was 40 to 45%.  He remains on beta-blocker, ARB, hydralazine, nitrate, and Lasix.  He appears to be euvolemic.  I am going to obtain a follow-up echocardiogram to ensure stability of LV systolic function.  He does have underlying left bundle branch block and if there is worsening ejection fraction, an ICD/CRT can be considered.  I am hesitant to switch him to [facility] given advanced chronic kidney disease with a GFR of 25.  No spironolactone for the same reason. ?  ?3.   Essential hypertension: Blood pressure is reasonably controlled on current medications. ?  ?4.  Hyperlipidemia: Most recent lipid profile showed an LDL of 77.  Previous LDL was below 70 and if repeat remains

## 2021-09-28 ENCOUNTER — Telehealth: Payer: Self-pay

## 2021-09-28 ENCOUNTER — Other Ambulatory Visit: Payer: Self-pay

## 2021-09-28 DIAGNOSIS — E1142 Type 2 diabetes mellitus with diabetic polyneuropathy: Secondary | ICD-10-CM

## 2021-09-28 MED ORDER — LANCETS MISC. MISC: 12 refills | Status: AC

## 2021-09-28 MED ORDER — LANTUS SOLOSTAR 100 UNIT/ML ~~LOC~~ SOPN: 40.0000 [IU] | PEN_INJECTOR | Freq: Every day | SUBCUTANEOUS | 2 refills | Status: DC

## 2021-09-28 NOTE — Telephone Encounter (Signed)
Pt  is requesting refill  on  lancets

## 2021-09-28 NOTE — Telephone Encounter (Signed)
Pt  called back requesting refill on  Lantus solostar

## 2021-09-28 NOTE — Telephone Encounter (Signed)
Sent to [facility] in [place].  ?

## 2021-10-02 ENCOUNTER — Other Ambulatory Visit: Payer: Self-pay

## 2021-10-02 DIAGNOSIS — E1142 Type 2 diabetes mellitus with diabetic polyneuropathy: Secondary | ICD-10-CM

## 2021-10-02 MED ORDER — LANTUS SOLOSTAR 100 UNIT/ML ~~LOC~~ SOPN: 40.0000 [IU] | PEN_INJECTOR | Freq: Every day | SUBCUTANEOUS | 2 refills | Status: DC

## 2021-10-03 NOTE — Telephone Encounter (Signed)
PA approved pt advised.  ? ?Thanks,  ? ?-Mickel Baas  ?

## 2021-10-03 NOTE — Telephone Encounter (Addendum)
Pt wife called to say she was unable to pick up her husband's medication LANTUS SOLOSTAR 100 UNIT/ML Solostar Pen ?PA is needed. Pt wife stated pt urgently needs medication requesting PA to be sent as soon as possible. ? ?Pt has been off medication for eight days.  ? ?Pt wife requested a call back.  ? ? ?PA [facility] [place] - [phone] ?Fax - [phone]  ?

## 2021-10-03 NOTE — Telephone Encounter (Signed)
PA sent through Cover My Meds.    Thanks,   -Socorro Kanitz  

## 2021-10-05 ENCOUNTER — Other Ambulatory Visit: Payer: Self-pay | Admitting: Cardiovascular Disease

## 2021-11-27 ENCOUNTER — Other Ambulatory Visit: Payer: Self-pay | Admitting: Family Medicine

## 2021-11-27 ENCOUNTER — Encounter: Payer: Self-pay | Admitting: Cardiovascular Disease

## 2021-11-27 ENCOUNTER — Ambulatory Visit: Payer: Medicaid Other | Admitting: Cardiovascular Disease

## 2021-11-27 VITALS — BP 168/90 | HR 71 | Ht 68.0 in | Wt 212.1 lb

## 2021-11-27 DIAGNOSIS — N184 Chronic kidney disease, stage 4 (severe): Secondary | ICD-10-CM

## 2021-11-27 DIAGNOSIS — I2510 Atherosclerotic heart disease of native coronary artery without angina pectoris: Secondary | ICD-10-CM | POA: Diagnosis not present

## 2021-11-27 DIAGNOSIS — I5022 Chronic systolic (congestive) heart failure: Secondary | ICD-10-CM

## 2021-11-27 DIAGNOSIS — I10 Essential (primary) hypertension: Secondary | ICD-10-CM | POA: Diagnosis not present

## 2021-11-27 DIAGNOSIS — E1142 Type 2 diabetes mellitus with diabetic polyneuropathy: Secondary | ICD-10-CM | POA: Diagnosis not present

## 2021-11-27 DIAGNOSIS — E785 Hyperlipidemia, unspecified: Secondary | ICD-10-CM

## 2021-11-27 NOTE — Patient Instructions (Signed)

## 2021-11-27 NOTE — Progress Notes (Signed)
?  ?Cardiology Office Note ? ? ?Date:  11/28/2021  ? ?ID:  [name], DOB Sep 29, 1960, MRN [id number] ? ?PCP:  Olin Hauser, DO  ?Cardiologist:   [name], MD  ? ?Chief Complaint  ?Patient presents with  ? OTHER  ?  6 month f/u no complaints today. Meds reviewed verbally with pt.  ? ? ?  ?History of Present Illness: ?[name] is a 61 y.o. male who presents for a follow-up visit regarding chronic systolic heart failure and coronary artery disease. ?He has past medical history of CAD, mixed ischemic and nonischemic cardiomyopathy, diabetes, hypertension, hyperlipidemia, and stage III chronic kidney disease.  He is status post drug-eluting stent placement to the LAD in April 2018.  At that time, he was found to have severe LV dysfunction with an EF of 25 to 30% and diffuse hypokinesis which was felt to be out of proportion to the LAD disease.  He also has a history of alcohol abuse.  A follow-up echocardiogram in July 2018 showed improved LV function with an EF of 40 to 45%.   ?Amlodipine was discontinued due to leg edema.   ? ?He had previous toe amputation but no history of peripheral arterial disease.  He has chronic kidney disease with GFR around 25.[name]  He follows with nephrology.   ? ?Most recent echocardiogram in February 2022 showed an EF of 45 to 50%.  There was concern about possible cardiac amyloidosis.  PYP scan was equivocal for amyloidosis though.  He has been doing reasonably well and denies chest pain or shortness of breath. ? ?He was prescribed Farxiga by nephrology but was not covered.  He was switched to [place].  He took the medication for 3 days and then stopped due to a rash. ? ?Past Medical History:  ?Diagnosis Date  ? Ankle pain   ? Chronic combined systolic (congestive) and diastolic (congestive) heart failure ([place]) 10/2016  ? a. 10/2016 Echo: EF 25-30%, diff HK; b. 01/2017 Echo:  EF 40-45%, GrI DD; c. 08/2017 Echo: EF 40-45%, GrI DD; d. 08/2020 Echo: EF 45-50%, glob  HK. Mod asymm LVH, GrI DD, Nl RV size/fxn; e. 08/2020 PYP: equivocal for ATTR cardiac amyloid.  ? CKD (chronic kidney disease), stage III - IV ([place])   ? Coronary artery disease   ? a. 10/2016 Cath/PCI: LAD 75m(3.5x15 Xience Alpine DES). No other obstructive disease.  ? Diabetic neuropathy ([place]   ? Hyperlipidemia   ? Hypertension   ? 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 a. 10/2016 Echo: EF 25-30%, diff HK; b. 01/2017 Echo:  EF 40-45%, GrI DD; c. 08/2017 Echo: EF 40-45%, GrI DD; d. 08/2020 Echo: EF 45-50%, glob HK. Mod asymm LVH, GrI DD, Nl RV size/fxn.  ? Pain in both feet   ? ? ?Past Surgical History:  ?Procedure Laterality Date  ? CARDIAC CATHETERIZATION    ? CORONARY STENT INTERVENTION N/A 10/21/2016  ? Procedure: Coronary Stent Intervention;  Surgeon: MWellington Hampshire MD;  [place];  Service: Cardiovascular;  Laterality: N/A;  ? LEFT HEART CATH AND CORONARY ANGIOGRAPHY N/A 10/21/2016  ? Procedure: Left Heart Cath and Coronary Angiography;  Surgeon: MWellington Hampshire MD;  [place];  Service: Cardiovascular;  Laterality: N/A;  ? TIBIA FRACTURE SURGERY Right 2002  ? ? ? ?Current Outpatient Medications  ?Medication Sig Dispense Refill  ? ACCU-CHEK FASTCLIX LANCETS MISC Check sugar up to 3 x daily as  instructed 102 each 12  ? ACCU-CHEK GUIDE test strip Check blood sugar up to 3 times daily as advised 100 each 12  ? acetaminophen (TYLENOL 8 HOUR) 650 MG CR tablet Take 1 tablet (650 mg total) by mouth every 8 (eight) hours as needed for pain.    ? aspirin EC 81 MG tablet Take 81 mg by mouth.     ? atorvastatin (LIPITOR) 40 MG tablet Take 1 tablet (40 mg total) by mouth daily. 90 tablet 3  ? b complex vitamins tablet Take 1 tablet by mouth daily.    ? B-D ULTRAFINE III SHORT PEN 31G X 8 MM MISC USE TO INJECT INSULIN NIGHTLY 100 each 3  ? blood glucose meter kit and supplies KIT Dispense brand based on patient and insurance preference. Use up to 3 times daily to  check blood sugar. DX: ICD 10 E11.9 Diabetes Type 2. 1 each 0  ? Blood Pressure Monitoring (BLOOD PRESSURE CUFF) MISC 1 each by Does not apply route daily. 1 each 0  ? carvedilol (COREG) 25 MG tablet Take 1 tablet (25 mg total) by mouth 2 (two) times daily with a meal. 180 tablet 3  ? furosemide (LASIX) 20 MG tablet TAKE 0.5 TABLETS (10 MG TOTAL) BY MOUTH DAILY. PLEASE KEEP YOUR UPCOMING APPOINTMENT FOR REFILLS. 45 tablet 0  ? hydrALAZINE (APRESOLINE) 100 MG tablet Take 1 tablet (100 mg total) by mouth 3 (three) times daily. 270 tablet 3  ? isosorbide mononitrate (IMDUR) 60 MG 24 hr tablet Take 1.5 tablets (90 mg total) by mouth daily. 135 tablet 3  ? Lancets Misc. MISC Use  Brand compatable to insurance and monitor to check blood sugar up to 3 times daily. ICD10 E11.9 100 each 12  ? LANTUS SOLOSTAR 100 UNIT/ML Solostar Pen Inject 40 Units into the skin at bedtime. 15 mL 2  ? telmisartan (MICARDIS) 40 MG tablet Take 1 tablet (40 mg total) by mouth daily. 90 tablet 1  ? gabapentin (NEURONTIN) 400 MG capsule TAKE 2 TO 3 CAPSULES BY MOUTH 3 TIMES DAILY. 270 capsule 0  ? ?No current facility-administered medications for this visit.  ? ? ?Allergies:   Sulfamethoxazole-trimethoprim, Amlodipine, Lisinopril, Jardiance [empagliflozin], and Terbinafine and related  ? ? ?Social History:  The patient  reports that he has never smoked. He has never used smokeless tobacco. He reports current alcohol use. He reports that he does not use drugs.  ? ?Family History:  The patient's family history includes Heart disease in his maternal grandfather; Hypertension in his mother.  ? ? ?ROS:  Please see the history of present illness.   Otherwise, review of systems are positive for none.   All other systems are reviewed and negative.  ? ? ?PHYSICAL EXAM: ?VS:  BP (!) 168/90 (BP Location: Left Arm, Patient Position: Sitting, Cuff Size: Normal)   Pulse 71   Ht _0  (1.727 m)   Wt 212 lb 2 oz (96.2 kg)   SpO2 97%   BMI 32.25 kg/m?  , BMI  Body mass index is 32.25 kg/m?. ?GEN: Well nourished, well developed, in no acute distress  ?HEENT: normal  ?Neck: No JVD, carotid bruits, or masses ?Cardiac: RRR; no murmurs, rubs, or gallops, trace bilateral leg edema worse on the right side ?Respiratory:  clear to auscultation bilaterally, normal work of breathing ?GI: soft, nontender, nondistended, + BS ?MS: no deformity or atrophy  ?Skin: warm and dry, no rash ?Neuro:  Strength and sensation are intact ?Psych: euthymic mood, full  affect ? ? ?EKG:  EKG is  ordered today. ?EKG showed normal sinus rhythm with left bundle branch block. ? ? ?Recent Labs: ?03/16/2021: ALT 30; Hemoglobin 13.6; Platelets 193 ?04/11/2021: BUN 53; Creat 2.49; Potassium 4.4; Sodium 136  ? ? ?Lipid Panel ?   ?Component Value Date/Time  ? CHOL 173 03/16/2021 1503  ? TRIG 462 (H) 03/16/2021 1503  ? HDL 36 (L) 03/16/2021 1503  ? CHOLHDL 4.8 03/16/2021 1503  ? CHOLHDL 3.4 02/15/2020 0804  ? VLDL 34 10/20/2016 0515  ? LDLCALC 66 03/16/2021 1503  ? [place] 77 02/15/2020 0804  ? LDLDIRECT 74 03/16/2021 1503  ? ?  ? ?Wt Readings from Last 3 Encounters:  ?11/27/21 212 lb 2 oz (96.2 kg)  ?07/18/21 206 lb 9.6 oz (93.7 kg)  ?04/11/21 202 lb 12.8 oz (92 kg)  ?  ? ? ? ? ?  10/28/2016  ?  3:28 PM  ?PAD Screen  ?Previous PAD dx? No  ?Previous surgical procedure? No  ?Pain with walking? No  ?Feet/toe relief with dangling? No  ?Painful, non-healing ulcers? No  ?Extremities discolored? No  ? ? ? ? ?ASSESSMENT AND PLAN: ? ?1.  Coronary artery disease: Status post LAD stenting in April 2018.  He has done well since then without any recurrent chest pain or dyspnea.      Continue aspirin indefinitely. ? ?2.  Chronic systolic heart failure due to Ischemic cardiomyopathy/HFrEF: Most recent ejection fraction was 45 to 50%.  He remains on beta-blocker, ARB, hydralazine, nitrate, and Lasix.  He appears to be euvolemic.  Given advanced chronic kidney disease with GFR of 25, he is not on Entresto or spironolactone.  In  addition, he could not afford [place] and Vania Rea was associated with generalized rash. ?  ?3.  Essential hypertension: Blood pressure is mildly elevated today.  Continue same medications for now.  We co

## 2021-11-28 NOTE — Telephone Encounter (Signed)
Future visit in 1 month. ?Requested Prescriptions  ?Pending Prescriptions Disp Refills  ?? gabapentin (NEURONTIN) 400 MG capsule [Pharmacy Med Name: GABAPENTIN 400 MG CAPSULE] 270 capsule 0  ?  Sig: TAKE 2 TO 3 CAPSULES BY MOUTH 3 TIMES DAILY.  ?  ? Neurology: Anticonvulsants - gabapentin Failed - 11/27/2021  9:03 AM  ?  ?  Failed - Cr in normal range and within 360 days  ?  Creat  ?Date Value Ref Range Status  ?04/11/2021 2.49 (H) 0.70 - 1.35 mg/dL Final  ? ?Creatinine, POC  ?Date Value Ref Range Status  ?05/15/2017 0 mg/dL Final  ?   ?  ?  Passed - Completed PHQ-2 or PHQ-9 in the last 360 days  ?  ?  Passed - Valid encounter within last 12 months  ?  Recent Outpatient Visits   ?      ? 4 months ago DM type 2 with diabetic peripheral neuropathy ([place])  ? [place], DO  ? 7 months ago DM type 2 with diabetic peripheral neuropathy [facility])  ? [place], DO  ? 10 months ago DM type 2 with diabetic peripheral neuropathy [facility])  ? [place], DO  ? 1 year ago DM type 2 with diabetic peripheral neuropathy [facility])  ? [place], DO  ? 1 year ago DM type 2 with diabetic peripheral neuropathy [facility])  ? [place], DO  ?  ?  ?Future Appointments   ?        ? In 1 month Karamalegos, Devonne Doughty, DO [facility], [place]  ? In 6 months Arida, Mertie Clause, MD [facility], [facility]  ?  ? ?  ?  ?  ? ?

## 2021-12-06 ENCOUNTER — Other Ambulatory Visit: Payer: Self-pay | Admitting: Cardiovascular Disease

## 2022-01-14 ENCOUNTER — Encounter: Payer: Medicaid Other | Admitting: Family Medicine

## 2022-01-21 ENCOUNTER — Ambulatory Visit (INDEPENDENT_AMBULATORY_CARE_PROVIDER_SITE_OTHER): Payer: Medicaid Other | Admitting: Family Medicine

## 2022-01-21 VITALS — BP 133/61 | HR 61 | Ht 68.0 in | Wt 208.6 lb

## 2022-01-21 DIAGNOSIS — E785 Hyperlipidemia, unspecified: Secondary | ICD-10-CM

## 2022-01-21 DIAGNOSIS — S98131D Complete traumatic amputation of one right lesser toe, subsequent encounter: Secondary | ICD-10-CM

## 2022-01-21 DIAGNOSIS — E669 Obesity, unspecified: Secondary | ICD-10-CM

## 2022-01-21 DIAGNOSIS — E1169 Type 2 diabetes mellitus with other specified complication: Secondary | ICD-10-CM | POA: Diagnosis not present

## 2022-01-21 DIAGNOSIS — Z89411 Acquired absence of right great toe: Secondary | ICD-10-CM

## 2022-01-21 DIAGNOSIS — R351 Nocturia: Secondary | ICD-10-CM

## 2022-01-21 DIAGNOSIS — E66811 Obesity, class 1: Secondary | ICD-10-CM

## 2022-01-21 DIAGNOSIS — I5042 Chronic combined systolic (congestive) and diastolic (congestive) heart failure: Secondary | ICD-10-CM

## 2022-01-21 DIAGNOSIS — E1142 Type 2 diabetes mellitus with diabetic polyneuropathy: Secondary | ICD-10-CM | POA: Diagnosis not present

## 2022-01-21 DIAGNOSIS — I129 Hypertensive chronic kidney disease with stage 1 through stage 4 chronic kidney disease, or unspecified chronic kidney disease: Secondary | ICD-10-CM | POA: Diagnosis not present

## 2022-01-21 DIAGNOSIS — Z0000 Encounter for general adult medical examination without abnormal findings: Secondary | ICD-10-CM

## 2022-01-21 DIAGNOSIS — N184 Chronic kidney disease, stage 4 (severe): Secondary | ICD-10-CM | POA: Diagnosis not present

## 2022-01-21 MED ORDER — GABAPENTIN 400 MG PO CAPS: 1200.0000 mg | ORAL_CAPSULE | Freq: Three times a day (TID) | ORAL | 11 refills | Status: DC

## 2022-01-21 MED ORDER — LANTUS SOLOSTAR 100 UNIT/ML ~~LOC~~ SOPN: 40.0000 [IU] | PEN_INJECTOR | Freq: Every day | SUBCUTANEOUS | 2 refills | Status: DC

## 2022-01-21 NOTE — Assessment & Plan Note (Signed)
Due for repeat A1c on labs R toe x 2 amputation w/ history of chronic ulcer/osteo No hypoglycemia Complications - Hyperglycemia, CKD-III-IV, peripheral neuropathy, DM retinopathy, DM ulceration Meds - contraindicated Metformin with CKD / Failed Bydureon BCise x 2 trials (nausea) OFF Victoza 1.8 u daily due to preference OFF Metformin due to CHF, CKD  Plan:  1. Continue Lantus 40 unit daily for now, gradual increase if fasting sugar >150 consistently. 2. Encourage improved lifestyle - low carb, low sugar diet, reduce portion size, continue improving regular exercise 3. Check CBG, bring log to next visit for review 4. Continue ASA, ARB, Statin

## 2022-01-21 NOTE — Progress Notes (Signed)
Subjective:    Patient ID: [name], male    DOB: [birth year], 61 y.o.   MRN: [id number]  [name] is a 61 y.o. male presenting on 01/21/2022 for Annual Exam   HPI  Here for Annual Physical and Due for fasting labs   CHRONIC DM, Type 2 with DM Neuropathy / CKD-IV Obesity BMI >30 Hyperlipidemia  Improved lifestyle diet CBG: improved results Meds: - Lantus 40u daily needs re order - OFF Metformin due to CKD. Failed Bydureon in past due to nausea Reports good compliance. Tolerating well w/o side-effects On ARB On Atorvastatin 56m daily. Last labs reviewed Lifestyle: - Diet (Admits improved DM diet) - Exercise (Limited regular exericse) [facility] burning, tingling at times in feet, worse at night Denies hypoglycemia, polyuria, visual changes   Hypertension / CKD-IV CHF, Diastolic, Systolic Combined He has been followed by [name] Harold LHolley Raringrecommended SGLT2 for CHF and DM/HTN He remains off Metformin On Telmisartan 444mdaily., Carvedilol 25 BID, Hydralazine     Health Maintenance: Declines PNA Vaccine, Shingrix     01/21/2022   12:47 PM 01/04/2021    8:11 AM 06/21/2020    8:42 AM  Depression screen PHQ 2/9  Decreased Interest 0 0 0  Down, Depressed, Hopeless 0 0 0  PHQ - 2 Score 0 0 0  Altered sleeping  3   Tired, decreased energy  3   Change in appetite  0   Feeling bad or failure about yourself   0   Trouble concentrating  0   Moving slowly or fidgety/restless  0   Suicidal thoughts  0   PHQ-9 Score  6   Difficult doing work/chores  Not difficult at all     Past Medical History:  Diagnosis Date   Ankle pain    Chronic combined systolic (congestive) and diastolic (congestive) heart failure ([place]04/2018   a. 10/2016 Echo: EF 25-30%, diff HK; b. 01/2017 Echo:  EF 40-45%, GrI DD; c. 08/2017 Echo: EF 40-45%, GrI DD; d. 08/2020 Echo: EF 45-50%, glob HK. Mod asymm LVH, GrI DD, Nl RV  size/fxn; e. 08/2020 PYP: equivocal for ATTR cardiac amyloid.   CKD (chronic kidney disease), stage III - IV ([facility] [place]   Coronary artery disease    a. 10/2016 Cath/PCI: LAD 9030m.5x15 Xience Alpine DES). No other obstructive disease.   Diabetic neuropathy ([place]  Hyperlipidemia    Hypertension    Mixed Ischemic & Nonischemic cardiomyopathy (CAD & ETOH)    a. 10/2016 Echo: EF 25-30%, diff HK; b. 01/2017 Echo:  EF 40-45%, GrI DD; c. 08/2017 Echo: EF 40-45%, GrI DD; d. 08/2020 Echo: EF 45-50%, glob HK. Mod asymm LVH, GrI DD, Nl RV size/fxn.   Pain in both feet    Past Surgical History:  Procedure Laterality Date   CARDIAC CATHETERIZATION     CORONARY STENT INTERVENTION N/A 10/21/2016   Procedure: Coronary Stent Intervention;  Surgeon: [name];  [facility];  Service: Cardiovascular;  Laterality: N/A;   LEFT HEART CATH AND CORONARY ANGIOGRAPHY N/A 10/21/2016   Procedure: Left Heart Cath and Coronary Angiography;  Surgeon: [name];  [facility];  Service: Cardiovascular;  Laterality: N/A;   TIBIA FRACTURE SURGERY Right 2002   Social History   Socioeconomic History   Marital status: Married    Spouse name: Not on file   Number of children:  Not on file   Years of education: Not on file   Highest education level: Not on file  Occupational History   Not on file  Tobacco Use   Smoking status: Never   Smokeless tobacco: Never  Vaping Use   Vaping Use: Never used  Substance and Sexual Activity   Alcohol use: Yes    Comment: occassionally   Drug use: No   Sexual activity: Not on file  Other Topics Concern   Not on file  Social History Narrative   Not on file   Social Determinants of Health   Financial Resource Strain: Not on file  Food Insecurity: Not on file  Transportation Needs: Not on file  Physical Activity: Not on file  Stress: Not on file  Social Connections: Not on file  Intimate Partner Violence: Not on file    Family History  Problem Relation Age of Onset   Hypertension Mother    Heart disease Maternal Grandfather    Current Outpatient Medications on File Prior to Visit  Medication Sig   ACCU-CHEK FASTCLIX LANCETS MISC Check sugar up to 3 x daily as instructed   ACCU-CHEK GUIDE test strip Check blood sugar up to 3 times daily as advised   acetaminophen (TYLENOL 8 HOUR) 650 MG CR tablet Take 1 tablet (650 mg total) by mouth every 8 (eight) hours as needed for pain.   aspirin EC 81 MG tablet Take 81 mg by mouth.    atorvastatin (LIPITOR) 40 MG tablet Take 1 tablet (40 mg total) by mouth daily.   b complex vitamins tablet Take 1 tablet by mouth daily.   B-D ULTRAFINE III SHORT PEN 31G X 8 MM MISC USE TO INJECT INSULIN NIGHTLY   blood glucose meter kit and supplies KIT Dispense brand based on patient and insurance preference. Use up to 3 times daily to check blood sugar. DX: ICD 10 E11.9 Diabetes Type 2.   Blood Pressure Monitoring (BLOOD PRESSURE CUFF) MISC 1 each by Does not apply route daily.   carvedilol (COREG) 25 MG tablet Take 1 tablet (25 mg total) by mouth 2 (two) times daily with a meal.   furosemide (LASIX) 20 MG tablet Take 0.5 tablets (10 mg total) by mouth daily.   hydrALAZINE (APRESOLINE) 100 MG tablet Take 1 tablet (100 mg total) by mouth 3 (three) times daily.   Lancets Misc. MISC Use  Brand compatable to insurance and monitor to check blood sugar up to 3 times daily. ICD10 E11.9   telmisartan (MICARDIS) 40 MG tablet Take 1 tablet (40 mg total) by mouth daily.   isosorbide mononitrate (IMDUR) 60 MG 24 hr tablet Take 1.5 tablets (90 mg total) by mouth daily.   No current facility-administered medications on file prior to visit.    Review of Systems  Constitutional:  Negative for activity change, appetite change, chills, diaphoresis, fatigue and fever.  HENT:  Negative for congestion and hearing loss.   Eyes:  Negative for visual disturbance.  Respiratory:  Negative for cough,  chest tightness, shortness of breath and wheezing.   Cardiovascular:  Negative for chest pain, palpitations and leg swelling.  Gastrointestinal:  Negative for abdominal pain, constipation, diarrhea, nausea and vomiting.  Genitourinary:  Negative for dysuria, frequency and hematuria.  Musculoskeletal:  Negative for arthralgias and neck pain.  Skin:  Negative for rash.  Neurological:  Negative for dizziness, weakness, light-headedness, numbness and headaches.  Hematological:  Negative for adenopathy.  Psychiatric/Behavioral:  Negative for behavioral problems, dysphoric mood and sleep  disturbance.    Per HPI unless specifically indicated above      Objective:    BP 133/61   Pulse 61   Ht 5' 8"  (1.727 m)   Wt 208 lb 9.6 oz (94.6 kg)   SpO2 97%   BMI 31.72 kg/m   Wt Readings from Last 3 Encounters:  01/21/22 208 lb 9.6 oz (94.6 kg)  11/27/21 212 lb 2 oz (96.2 kg)  07/18/21 206 lb 9.6 oz (93.7 kg)    Physical Exam Vitals and nursing note reviewed.  Constitutional:      General: He is not in acute distress.    Appearance: He is well-developed. He is not diaphoretic.     Comments: Well-appearing, comfortable, cooperative  HENT:     Head: Normocephalic and atraumatic.  Eyes:     General:        Right eye: No discharge.        Left eye: No discharge.     Conjunctiva/sclera: Conjunctivae normal.     Pupils: Pupils are equal, round, and reactive to light.  Neck:     Thyroid: No thyromegaly.     Vascular: No carotid bruit.  Cardiovascular:     Rate and Rhythm: Normal rate and regular rhythm.     Pulses: Normal pulses.     Heart sounds: Normal heart sounds. No murmur heard. Pulmonary:     Effort: Pulmonary effort is normal. No respiratory distress.     Breath sounds: Normal breath sounds. No wheezing or rales.  Abdominal:     General: Bowel sounds are normal. There is no distension.     Palpations: Abdomen is soft. There is no mass.     Tenderness: There is no abdominal  tenderness.  Musculoskeletal:        General: No tenderness. Normal range of motion.     Cervical back: Normal range of motion and neck supple.     Right lower leg: No edema.     Left lower leg: No edema.     Comments: S/p Foot deformity amputation R Great toe little toe  Upper / Lower Extremities: - Normal muscle tone, strength bilateral upper extremities 5/5, lower extremities 5/5  Lymphadenopathy:     Cervical: No cervical adenopathy.  Skin:    General: Skin is warm and dry.     Findings: No erythema or rash.  Neurological:     Mental Status: He is alert and oriented to person, place, and time.     Comments: Distal sensation intact to light touch all extremities  Psychiatric:        Mood and Affect: Mood normal.        Behavior: Behavior normal.        Thought Content: Thought content normal.     Comments: Well groomed, good eye contact, normal speech and thoughts     Diabetic Foot Exam - Simple   Simple Foot Form Diabetic Foot exam was performed with the following findings: Yes 01/21/2022 10:05 AM  Visual Inspection See comments: Yes Sensation Testing See comments: Yes Pulse Check Posterior Tibialis and Dorsalis pulse intact bilaterally: Yes Comments Bilateral dry skin dermatitis. S/p prior toe amputations R great toe and 5th toe. Healed well. No ulceration. Callus formation. Has reduced monofilament sensation bilateral forefoot toes and intact dorsal and heel.     Results for orders placed or performed in visit on 07/18/21  POCT HgB A1C  Result Value Ref Range   Hemoglobin A1C 8.9 (A) 4.0 - 5.6 %  HM DIABETES EYE EXAM  Result Value Ref Range   HM Diabetic Eye Exam Retinopathy (A) No Retinopathy      Assessment & Plan:   Problem List Items Addressed This Visit     Right great toe amputee ([place])   Obesity (BMI 30.0-34.9)   Hyperlipidemia associated with type 2 diabetes mellitus (HCC)    Controlled LDL, high TG Calculated ASCVD 10 yr risk score elevated with  DM, CAD  Plan: 1. Continue Atorvastatin 54m daily 2. Continue Plavix, ASA 824mfor secondary ASCVD risk reduction 3. Encourage improved lifestyle - low carb/cholesterol, reduce portion size, continue improving regular exercise      Relevant Medications   LANTUS SOLOSTAR 100 UNIT/ML Solostar Pen   Other Relevant Orders   Lipid panel   DM type 2 with diabetic peripheral neuropathy (HCC)    Due for repeat A1c on labs R toe x 2 amputation w/ history of chronic ulcer/osteo No hypoglycemia Complications - Hyperglycemia, CKD-III-IV, peripheral neuropathy, DM retinopathy, DM ulceration Meds - contraindicated Metformin with CKD / Failed Bydureon BCise x 2 trials (nausea) OFF Victoza 1.8 u daily due to preference OFF Metformin due to CHF, CKD  Plan:  1. Continue Lantus 40 unit daily for now, gradual increase if fasting sugar >150 consistently. 2. Encourage improved lifestyle - low carb, low sugar diet, reduce portion size, continue improving regular exercise 3. Check CBG, bring log to next visit for review 4. Continue ASA, ARB, Statin      Relevant Medications   LANTUS SOLOSTAR 100 UNIT/ML Solostar Pen   gabapentin (NEURONTIN) 400 MG capsule   Other Relevant Orders   Hemoglobin A1c   CKD (chronic kidney disease), stage IV (HCC)   Relevant Orders   COMPLETE METABOLIC PANEL WITH GFR   CBC with Differential/Platelet   Chronic combined systolic (congestive) and diastolic (congestive) heart failure (HCC)    Clinically stable, euvolemic today CHF with history of reduced EF 4011-94%omplication with CKD-III-IV  Plan Continue current med management per Cardiology Reviewed diet avoid high sodium foods Add SGLT2 Jardiance per Nephrology recs      Benign hypertension with CKD (chronic kidney disease) stage IV (HCC)    Improved BP in range Complication with CKD-IIV worsening, CAD    Plan:  Patient should be on Jardiance 1035maily per Nephrology as well. Do not see it on list  today  1. Continue current BP regimen - Carvedilol 67m93mD, Hydralazine 100mg35m hr, isosorbide mononitrate imdur 90mg,63mmisartan 40mg 263mcourage improved lifestyle - low sodium diet, regular exercise 3. Continue monitor BP outside office, bring readings to next visit, if persistently >140/90 or new symptoms notify office sooner      Relevant Orders   COMPLETE METABOLIC PANEL WITH GFR   CBC with Differential/Platelet   Amputation of little toe (HCC)   Labetteer Visit Diagnoses     Annual physical exam    -  Primary   Relevant Orders   COMPLETE METABOLIC PANEL WITH GFR   CBC with Differential/Platelet   Lipid panel   Hemoglobin A1c   PSA   Nocturia       Relevant Orders   PSA   Diabetic polyneuropathy associated with type 2 diabetes mellitus (HCC)       Relevant Medications   LANTUS SOLOSTAR 100 UNIT/ML Solostar Pen   gabapentin (NEURONTIN) 400 MG capsule       Updated Health Maintenance information Fasting lab only today Encouraged improvement to lifestyle with diet and exercise  Goal of weight loss  Chronic bilateral diabetic neuropathy in both feet, as complication of diabetes Additionally with Right foot deformity s/p R great toe amputation and R little 5th toe amputation, well healed now. Evidence of callus formation both feet, heels and mid foot  Completed DM Foot Exam today in office, 01/21/22. See exam note.  Plan - Proceed with ordering Diabetic Shoes - as requested he will check with company [employer] to get an order form and I will complete it when I receive it.  - Patient would benefit from Diabetic Shoes due to neuropathy with callus formation and great toe deformity s/p amputation, diabetes control is improving on current regimen, and I am continuing to monitor and manage diabetes.    Meds ordered this encounter  Medications   LANTUS SOLOSTAR 100 UNIT/ML Solostar Pen    Sig: Inject 40 Units into the skin at bedtime.    Dispense:  15 mL     Refill:  2   gabapentin (NEURONTIN) 400 MG capsule    Sig: Take 3 capsules (1,200 mg total) by mouth 3 (three) times daily.    Dispense:  270 capsule    Refill:  11      Follow up plan: Return in about 6 months (around 07/24/2022) for 6 month DM A1c HTN.  [name], [facility] 01/21/2022, 10:02 AM

## 2022-01-21 NOTE — Assessment & Plan Note (Signed)
Controlled LDL, high TG Calculated ASCVD 10 yr risk score elevated with DM, CAD  Plan: 1. Continue Atorvastatin 40mg  daily 2. Continue Plavix, ASA 81mg  for secondary ASCVD risk reduction 3. Encourage improved lifestyle - low carb/cholesterol, reduce portion size, continue improving regular exercise

## 2022-01-21 NOTE — Assessment & Plan Note (Signed)
Improved BP in range Complication with CKD-IIV worsening, CAD    Plan:  Patient should be on Jardiance 10mg  daily per Nephrology as well. Do not see it on list today  1. Continue current BP regimen - Carvedilol 25mg  BID, Hydralazine 100mg  q 8 hr, isosorbide mononitrate imdur 90mg , Telmisartan 40mg  2. Encourage improved lifestyle - low sodium diet, regular exercise 3. Continue monitor BP outside office, bring readings to next visit, if persistently >140/90 or new symptoms notify office sooner

## 2022-01-21 NOTE — Patient Instructions (Addendum)
Thank you for coming to the office today.  [employer] - Diabetic Shoe - will need the order form so we can re order.  Our fax # [phone]  Refilled Lantus and Gabapentin  Lab orders today.  Please schedule a Follow-up Appointment to: Return in about 6 months (around 07/24/2022) for 6 month DM A1c HTN.  If you have any other questions or concerns, please feel free to call the office or send a message through [place]. You may also schedule an earlier appointment if necessary.  Additionally, you may be receiving a survey about your experience at our office within a few days to 1 week by e-mail or mail. We value your feedback.  [name], DO [place]

## 2022-01-21 NOTE — Assessment & Plan Note (Signed)
Clinically stable, euvolemic today CHF with history of reduced EF 48-18% Complication with CKD-III-IV  Plan Continue current med management per Cardiology Reviewed diet avoid high sodium foods Add SGLT2 Jardiance per Nephrology recs

## 2022-01-22 ENCOUNTER — Telehealth: Payer: Self-pay | Admitting: Family Medicine

## 2022-01-22 DIAGNOSIS — E1142 Type 2 diabetes mellitus with diabetic polyneuropathy: Secondary | ICD-10-CM

## 2022-01-22 LAB — CBC WITH DIFFERENTIAL/PLATELET
Absolute Monocytes: 571 cells/uL (ref 200–950)
Basophils Absolute: 27 cells/uL (ref 0–200)
Basophils Relative: 0.4 %
Eosinophils Absolute: 82 cells/uL (ref 15–500)
Eosinophils Relative: 1.2 %
HCT: 41.5 % (ref 38.5–50.0)
Hemoglobin: 13.8 g/dL (ref 13.2–17.1)
Lymphs Abs: 1095 cells/uL (ref 850–3900)
MCH: 33.3 pg — ABNORMAL HIGH (ref 27.0–33.0)
MCHC: 33.3 g/dL (ref 32.0–36.0)
MCV: 100.2 fL — ABNORMAL HIGH (ref 80.0–100.0)
MPV: 10.7 fL (ref 7.5–12.5)
Monocytes Relative: 8.4 %
Neutro Abs: 5025 cells/uL (ref 1500–7800)
Neutrophils Relative %: 73.9 %
Platelets: 188 10*3/uL (ref 140–400)
RBC: 4.14 10*6/uL — ABNORMAL LOW (ref 4.20–5.80)
RDW: 12.4 % (ref 11.0–15.0)
Total Lymphocyte: 16.1 %
WBC: 6.8 10*3/uL (ref 3.8–10.8)

## 2022-01-22 LAB — COMPLETE METABOLIC PANEL WITH GFR
AG Ratio: 1.4 (calc) (ref 1.0–2.5)
ALT: 19 U/L (ref 9–46)
AST: 17 U/L (ref 10–35)
Albumin: 3.7 g/dL (ref 3.6–5.1)
Alkaline phosphatase (APISO): 64 U/L (ref 35–144)
BUN/Creatinine Ratio: 16 (calc) (ref 6–22)
BUN: 38 mg/dL — ABNORMAL HIGH (ref 7–25)
CO2: 27 mmol/L (ref 20–32)
Calcium: 8.8 mg/dL (ref 8.6–10.3)
Chloride: 102 mmol/L (ref 98–110)
Creat: 2.4 mg/dL — ABNORMAL HIGH (ref 0.70–1.35)
Globulin: 2.6 g/dL (calc) (ref 1.9–3.7)
Glucose, Bld: 184 mg/dL — ABNORMAL HIGH (ref 65–139)
Potassium: 4.5 mmol/L (ref 3.5–5.3)
Sodium: 138 mmol/L (ref 135–146)
Total Bilirubin: 0.6 mg/dL (ref 0.2–1.2)
Total Protein: 6.3 g/dL (ref 6.1–8.1)
eGFR: 30 mL/min/{1.73_m2} — ABNORMAL LOW (ref >=60)

## 2022-01-22 LAB — HEMOGLOBIN A1C
Hgb A1c MFr Bld: 7.9 % of total Hgb — ABNORMAL HIGH (ref <5.7)
Mean Plasma Glucose: 180 mg/dL
eAG (mmol/L): 10 mmol/L

## 2022-01-22 LAB — LIPID PANEL
Cholesterol: 141 mg/dL (ref <200)
HDL: 42 mg/dL (ref >=40)
LDL Cholesterol (Calc): 71 mg/dL (calc)
Non-HDL Cholesterol (Calc): 99 mg/dL (calc) (ref <130)
Total CHOL/HDL Ratio: 3.4 (calc) (ref <5.0)
Triglycerides: 214 mg/dL — ABNORMAL HIGH (ref <150)

## 2022-01-22 LAB — PSA: PSA: 0.6 ng/mL (ref <=4.00)

## 2022-01-22 NOTE — Telephone Encounter (Signed)
Pt goes to [facility] to get his diabetic shoes and they need an order faxed to them from Dr. Raliegh Ip please advise /

## 2022-01-22 NOTE — Telephone Encounter (Signed)
Ok I can hand write an order and fax it to them.  I don't see a fax # listed here.  Ph: [phone]  Please send copy of office visit note as well.  [name], [facility] 01/22/2022, 4:59 PM

## 2022-02-07 ENCOUNTER — Telehealth: Payer: Self-pay

## 2022-02-07 NOTE — Telephone Encounter (Signed)
Opened in error.   Thanks,   -Zayana Salvador  

## 2022-02-28 ENCOUNTER — Other Ambulatory Visit: Payer: Self-pay | Admitting: Nurse Practitioner

## 2022-02-28 ENCOUNTER — Other Ambulatory Visit: Payer: Self-pay | Admitting: Cardiovascular Disease

## 2022-04-16 DIAGNOSIS — S98131D Complete traumatic amputation of one right lesser toe, subsequent encounter: Secondary | ICD-10-CM | POA: Diagnosis not present

## 2022-04-16 DIAGNOSIS — E1142 Type 2 diabetes mellitus with diabetic polyneuropathy: Secondary | ICD-10-CM | POA: Diagnosis not present

## 2022-04-16 DIAGNOSIS — Z89411 Acquired absence of right great toe: Secondary | ICD-10-CM | POA: Diagnosis not present

## 2022-05-13 ENCOUNTER — Other Ambulatory Visit: Payer: Self-pay | Admitting: Nurse Practitioner

## 2022-05-13 DIAGNOSIS — I1A0 Resistant hypertension: Secondary | ICD-10-CM

## 2022-05-13 DIAGNOSIS — E1169 Type 2 diabetes mellitus with other specified complication: Secondary | ICD-10-CM

## 2022-05-22 ENCOUNTER — Other Ambulatory Visit: Payer: Self-pay | Admitting: Family Medicine

## 2022-05-22 ENCOUNTER — Other Ambulatory Visit: Payer: Self-pay | Admitting: Cardiovascular Disease

## 2022-05-22 ENCOUNTER — Other Ambulatory Visit: Payer: Self-pay | Admitting: Nurse Practitioner

## 2022-05-22 DIAGNOSIS — E1169 Type 2 diabetes mellitus with other specified complication: Secondary | ICD-10-CM

## 2022-05-22 DIAGNOSIS — E1142 Type 2 diabetes mellitus with diabetic polyneuropathy: Secondary | ICD-10-CM

## 2022-05-22 DIAGNOSIS — I1A0 Resistant hypertension: Secondary | ICD-10-CM

## 2022-05-30 ENCOUNTER — Ambulatory Visit: Payer: Medicaid Other | Attending: Cardiovascular Disease | Admitting: Cardiovascular Disease

## 2022-05-30 NOTE — Progress Notes (Deleted)
Cardiology Office Note   Date:  05/30/2022   ID:  CHENEY [name], DOB [birth year], MRN [id number]  PCP:  Olin Hauser, DO  Cardiologist:   [name], MD   No chief complaint on file.     History of Present Illness: [name] is a 61 y.o. male who presents for a follow-up visit regarding chronic systolic heart failure and coronary artery disease. He has past medical history of CAD, mixed ischemic and nonischemic cardiomyopathy, diabetes, hypertension, hyperlipidemia, and stage III chronic kidney disease.  He is status post drug-eluting stent placement to the LAD in April 2018.  At that time, he was found to have severe LV dysfunction with an EF of 25 to 30% and diffuse hypokinesis which was felt to be out of proportion to the LAD disease.  He also has a history of alcohol abuse.  A follow-up echocardiogram in July 2018 showed improved LV function with an EF of 40 to 45%.   Amlodipine was discontinued due to leg edema.    He had previous toe amputation but no history of peripheral arterial disease.  He has chronic kidney disease with GFR around 25.[name]  He follows with nephrology.    Most recent echocardiogram in February 2022 showed an EF of 45 to 50%.  There was concern about possible cardiac amyloidosis.  PYP scan was equivocal for amyloidosis though.  He has been doing reasonably well and denies chest pain or shortness of breath.  He was prescribed Farxiga by nephrology but was not covered.  He was switched to [place].  He took the medication for 3 days and then stopped due to a rash.  Past Medical History:  Diagnosis Date   Ankle pain    Chronic combined systolic (congestive) and diastolic (congestive) heart failure ([place]) 10/2016   a. 10/2016 Echo: EF 25-30%, diff HK; b. 01/2017 Echo:  EF 40-45%, GrI DD; c. 08/2017 Echo: EF 40-45%, GrI DD; d. 08/2020 Echo: EF 45-50%, glob HK. Mod asymm LVH, GrI DD, Nl RV size/fxn; e. 08/2020 PYP: equivocal for ATTR cardiac  amyloid.   CKD (chronic kidney disease), stage III - IV ([place])    Coronary artery disease    a. 10/2016 Cath/PCI: LAD 64m(3.5x15 Xience Alpine DES). No other obstructive disease.   Diabetic [place]    Hyperlipidemia    Hypertension    Mixed Ischemic & Nonischemic cardiomyopathy (CAD & ETOH)    a. 10/2016 Echo: EF 25-30%, diff HK; b. 01/2017 Echo:  EF 40-45%, GrI DD; c. 08/2017 Echo: EF 40-45%, GrI DD; d. 08/2020 Echo: EF 45-50%, glob HK. Mod asymm LVH, GrI DD, Nl RV size/fxn.   Pain in both feet     Past Surgical History:  Procedure Laterality Date   CARDIAC CATHETERIZATION     CORONARY STENT INTERVENTION N/A 10/21/2016   Procedure: Coronary Stent Intervention;  Surgeon: MWellington Hampshire MD;  [facility];  Service: Cardiovascular;  Laterality: N/A;   LEFT HEART CATH AND CORONARY ANGIOGRAPHY N/A 10/21/2016   Procedure: Left Heart Cath and Coronary Angiography;  Surgeon: MWellington Hampshire MD;  [facility];  Service: Cardiovascular;  Laterality: N/A;   TIBIA FRACTURE SURGERY Right 2002     Current Outpatient Medications  Medication Sig Dispense Refill   ACCU-CHEK FASTCLIX LANCETS MISC Check sugar up to 3 x daily as instructed 102 each 12   ACCU-CHEK GUIDE test strip Check blood sugar up to 3 times daily as  advised 100 each 12   acetaminophen (TYLENOL 8 HOUR) 650 MG CR tablet Take 1 tablet (650 mg total) by mouth every 8 (eight) hours as needed for pain.     aspirin EC 81 MG tablet Take 81 mg by mouth.      atorvastatin (LIPITOR) 40 MG tablet TAKE 1 TABLET BY MOUTH EVERY DAY 30 tablet 0   b complex vitamins tablet Take 1 tablet by mouth daily.     B-D ULTRAFINE III SHORT PEN 31G X 8 MM MISC USE TO INJECT INSULIN NIGHTLY 100 each 3   blood glucose meter kit and supplies KIT Dispense brand based on patient and insurance preference. Use up to 3 times daily to check blood sugar. DX: ICD 10 E11.9 Diabetes Type 2. 1 each 0   Blood Pressure Monitoring  (BLOOD PRESSURE CUFF) MISC 1 each by Does not apply route daily. 1 each 0   carvedilol (COREG) 25 MG tablet TAKE 1 TABLET (25 MG TOTAL) BY MOUTH 2 (TWO) TIMES DAILY WITH A MEAL. 60 tablet 0   furosemide (LASIX) 20 MG tablet TAKE 1/2 TABLET BY MOUTH DAILY 45 tablet 0   gabapentin (NEURONTIN) 400 MG capsule Take 3 capsules (1,200 mg total) by mouth 3 (three) times daily. 270 capsule 11   hydrALAZINE (APRESOLINE) 100 MG tablet Take 1 tablet (100 mg total) by mouth 3 (three) times daily. 270 tablet 3   isosorbide mononitrate (IMDUR) 60 MG 24 hr tablet Take 1.5 tablets (90 mg total) by mouth daily. 135 tablet 0   Lancets Misc. MISC Use  Brand compatable to insurance and monitor to check blood sugar up to 3 times daily. ICD10 E11.9 100 each 12   LANTUS SOLOSTAR 100 UNIT/ML Solostar Pen INJECT 40 UNITS INTO THE SKIN AT BEDTIME. 15 mL 2   telmisartan (MICARDIS) 40 MG tablet Take 1 tablet (40 mg total) by mouth daily. 90 tablet 1   No current facility-administered medications for this visit.    Allergies:   Sulfamethoxazole-trimethoprim, Amlodipine, Lisinopril, Jardiance [empagliflozin], and Terbinafine and related    Social History:  The patient  reports that he has never smoked. He has never used smokeless tobacco. He reports current alcohol use. He reports that he does not use drugs.   Family History:  The patient's family history includes Heart disease in his maternal grandfather; Hypertension in his mother.    ROS:  Please see the history of present illness.   Otherwise, review of systems are positive for none.   All other systems are reviewed and negative.    PHYSICAL EXAM: VS:  There were no vitals taken for this visit. , BMI There is no height or weight on file to calculate BMI. GEN: Well nourished, well developed, in no acute distress  HEENT: normal  Neck: No JVD, carotid bruits, or masses Cardiac: RRR; no murmurs, rubs, or gallops, trace bilateral leg edema worse on the right  side Respiratory:  clear to auscultation bilaterally, normal work of breathing GI: soft, nontender, nondistended, + BS MS: no deformity or atrophy  Skin: warm and dry, no rash Neuro:  Strength and sensation are intact Psych: euthymic mood, full affect   EKG:  EKG is  ordered today. EKG showed normal sinus rhythm with left bundle branch block.   Recent Labs: 01/21/2022: ALT 19; BUN 38; Creat 2.40; Hemoglobin 13.8; Platelets 188; Potassium 4.5; Sodium 138    Lipid Panel    Component Value Date/Time   CHOL 141 01/21/2022 1016   CHOL  173 03/16/2021 1503   TRIG 214 (H) 01/21/2022 1016   HDL 42 01/21/2022 1016   HDL 36 (L) 03/16/2021 1503   CHOLHDL 3.4 01/21/2022 1016   VLDL 34 10/20/2016 0515   LDLCALC 71 01/21/2022 1016   LDLDIRECT 74 03/16/2021 1503      Wt Readings from Last 3 Encounters:  01/21/22 208 lb 9.6 oz (94.6 kg)  11/27/21 212 lb 2 oz (96.2 kg)  07/18/21 206 lb 9.6 oz (93.7 kg)          10/28/2016    3:28 PM  PAD Screen  Previous PAD dx? No  Previous surgical procedure? No  Pain with walking? No  Feet/toe relief with dangling? No  Painful, non-healing ulcers? No  Extremities discolored? No      ASSESSMENT AND PLAN:  1.  Coronary artery disease: Status post LAD stenting in April 2018.  He has done well since then without any recurrent chest pain or dyspnea.      Continue aspirin indefinitely.  2.  Chronic systolic heart failure due to Ischemic cardiomyopathy/HFrEF: Most recent ejection fraction was 45 to 50%.  He remains on beta-blocker, ARB, hydralazine, nitrate, and Lasix.  He appears to be euvolemic.  Given advanced chronic kidney disease with GFR of 25, he is not on Entresto or spironolactone.  In addition, he could not afford [place] and Vania Rea was associated with generalized rash.   3.  Essential hypertension: Blood pressure is mildly elevated today.  Continue same medications for now.  We could consider resuming amlodipine in the future but at  a lower dose.   4.  Hyperlipidemia: Continue treatment with atorvastatin.  Most recent lipid profile showed an LDL of 74 which is close to target.   5.  Type 2 diabetes mellitus: Most recent hemoglobin A1c was 8.9.    6.  Stage III chronic kidney disease: Stable    Disposition:   FU with me in 6 months  Signed,  [name], MD  05/30/2022 7:55 AM    [place]

## 2022-05-31 ENCOUNTER — Encounter: Payer: Self-pay | Admitting: Cardiovascular Disease

## 2022-06-18 ENCOUNTER — Other Ambulatory Visit: Payer: Self-pay | Admitting: Nurse Practitioner

## 2022-06-18 DIAGNOSIS — E1169 Type 2 diabetes mellitus with other specified complication: Secondary | ICD-10-CM

## 2022-06-18 DIAGNOSIS — I1A0 Resistant hypertension: Secondary | ICD-10-CM

## 2022-06-20 ENCOUNTER — Ambulatory Visit: Payer: Medicaid Other | Attending: Cardiovascular Disease | Admitting: Cardiovascular Disease

## 2022-06-20 ENCOUNTER — Encounter: Payer: Self-pay | Admitting: Cardiovascular Disease

## 2022-06-20 VITALS — BP 138/90 | HR 58 | Ht 68.0 in | Wt 209.1 lb

## 2022-06-20 DIAGNOSIS — I5022 Chronic systolic (congestive) heart failure: Secondary | ICD-10-CM

## 2022-06-20 DIAGNOSIS — I2510 Atherosclerotic heart disease of native coronary artery without angina pectoris: Secondary | ICD-10-CM

## 2022-06-20 DIAGNOSIS — I255 Ischemic cardiomyopathy: Secondary | ICD-10-CM

## 2022-06-20 DIAGNOSIS — I10 Essential (primary) hypertension: Secondary | ICD-10-CM | POA: Diagnosis not present

## 2022-06-20 DIAGNOSIS — E785 Hyperlipidemia, unspecified: Secondary | ICD-10-CM | POA: Diagnosis not present

## 2022-06-20 MED ORDER — FUROSEMIDE 40 MG PO TABS: 40.0000 mg | ORAL_TABLET | Freq: Every day | ORAL | 1 refills | Status: DC

## 2022-06-20 MED ORDER — ISOSORBIDE MONONITRATE ER 60 MG PO TB24: 60.0000 mg | ORAL_TABLET | Freq: Two times a day (BID) | ORAL | 1 refills | Status: DC

## 2022-06-20 NOTE — Patient Instructions (Addendum)
Medication Instructions:  INCREASE the Furosemide to 40 mg once daily CHANGE the Imdur to 60 mg twice daily  *If you need a refill on your cardiac medications before your next appointment, please call your pharmacy*   Lab Work: Your provider would like for you to return in one week to have the following labs drawn: BMET.   Please go to the [facility] entrance and check in at the front desk.  You do not need an appointment.  They are open from 7am-6 pm.  You will not need to be fasting.  If you have labs (blood work) drawn today and your tests are completely normal, you will receive your results only by: [place] (if you have MyChart) OR A paper copy in the mail If you have any lab test that is abnormal or we need to change your treatment, we will call you to review the results.   Testing/Procedures: None ordered   Follow-Up: At [facility], you and your health needs are our priority.  As part of our continuing mission to provide you with exceptional heart care, we have created designated Provider Care Teams.  These Care Teams include your primary Cardiologist (physician) and Advanced Practice Providers (APPs -  Physician Assistants and Nurse Practitioners) who all work together to provide you with the care you need, when you need it.  We recommend signing up for the patient portal called "MyChart".  Sign up information is provided on this After Visit Summary.  MyChart is used to connect with patients for Virtual Visits (Telemedicine).  Patients are able to view lab/test results, encounter notes, upcoming appointments, etc.  Non-urgent messages can be sent to your provider as well.   To learn more about what you can do with MyChart, go to [email or web address].    Your next appointment:   6 month(s)  The format for your next appointment:   In Person  Provider:   You may see [name], MD or one of the following Advanced Practice Providers on your  designated Care Team:   Murray Hodgkins, NP Christell Faith, PA-C Cadence Kathlen Mody, PA-C Gerrie Nordmann, NP    Other Instructions A referral has been placed to Dr. Haroldine Laws at the [facility]

## 2022-06-20 NOTE — Progress Notes (Signed)
Cardiology Office Note   Date:  06/20/2022   ID:  [name], DOB 05/19/1961, MRN [id number]  PCP:  Olin Hauser, DO  Cardiologist:   [name], MD   Chief Complaint  Patient presents with   Other    6 month f/u c/o edema ankles. Meds reviewed verbally with pt.      History of Present Illness: [name] is a 61 y.o. male who presents for a follow-up visit regarding chronic systolic heart failure and coronary artery disease. He has past medical history of CAD, mixed ischemic and nonischemic cardiomyopathy, diabetes, hypertension, hyperlipidemia, and stage III chronic kidney disease.  He is status post drug-eluting stent placement to the LAD in April 2018.  At that time, he was found to have severe LV dysfunction with an EF of 25 to 30% and diffuse hypokinesis which was felt to be out of proportion to the LAD disease.  He also has a history of alcohol abuse.  A follow-up echocardiogram in July 2018 showed improved LV function with an EF of 40 to 45%.   Amlodipine was discontinued due to leg edema.    He had previous toe amputation but no history of peripheral arterial disease.  He has chronic kidney disease with GFR around 25.[name]  He follows with nephrology.    Most recent echocardiogram in February 2022 showed an EF of 45 to 50%.  There was concern about possible cardiac amyloidosis.  PYP scan was equivocal for amyloidosis though.  He was prescribed Farxiga by nephrology but was not covered.  Jardiance had to be stopped due to a rash.  He reports some worsening of exertional dyspnea as well as lower extremity edema.  He is only taking 10 mg of furosemide daily.  No chest pain.  Past Medical History:  Diagnosis Date   Ankle pain    Chronic combined systolic (congestive) and diastolic (congestive) heart failure ([place]) 10/2016   a. 10/2016 Echo: EF 25-30%, diff HK; b. 01/2017 Echo:  EF 40-45%, GrI DD; c. 08/2017 Echo: EF 40-45%, GrI DD; d. 08/2020 Echo: EF  45-50%, glob HK. Mod asymm LVH, GrI DD, Nl RV size/fxn; e. 08/2020 PYP: equivocal for ATTR cardiac amyloid.   CKD (chronic kidney disease), stage III - IV ([place])    Coronary artery disease    a. 10/2016 Cath/PCI: LAD 33m(3.5x15 Xience Alpine DES). No other obstructive disease.   Diabetic neuropathy ([place]    Hyperlipidemia    Hypertension    Mixed Ischemic & Nonischemic cardiomyopathy (CAD & ETOH)    a. 10/2016 Echo: EF 25-30%, diff HK; b. 01/2017 Echo:  EF 40-45%, GrI DD; c. 08/2017 Echo: EF 40-45%, GrI DD; d. 08/2020 Echo: EF 45-50%, glob HK. Mod asymm LVH, GrI DD, Nl RV size/fxn.   Pain in both feet     Past Surgical History:  Procedure Laterality Date   CARDIAC CATHETERIZATION     CORONARY STENT INTERVENTION N/A 10/21/2016   Procedure: Coronary Stent Intervention;  Surgeon: MWellington Hampshire MD;  [facility];  Service: Cardiovascular;  Laterality: N/A;   LEFT HEART CATH AND CORONARY ANGIOGRAPHY N/A 10/21/2016   Procedure: Left Heart Cath and Coronary Angiography;  Surgeon: MWellington Hampshire MD;  [facility];  Service: Cardiovascular;  Laterality: N/A;   TIBIA FRACTURE SURGERY Right 2002     Current Outpatient Medications  Medication Sig Dispense Refill   ACCU-CHEK FASTCLIX LANCETS MISC Check sugar up to 3 x  daily as instructed 102 each 12   ACCU-CHEK GUIDE test strip Check blood sugar up to 3 times daily as advised 100 each 12   acetaminophen (TYLENOL 8 HOUR) 650 MG CR tablet Take 1 tablet (650 mg total) by mouth every 8 (eight) hours as needed for pain.     aspirin EC 81 MG tablet Take 81 mg by mouth.      atorvastatin (LIPITOR) 40 MG tablet TAKE 1 TABLET BY MOUTH EVERY DAY 30 tablet 0   B-D ULTRAFINE III SHORT PEN 31G X 8 MM MISC USE TO INJECT INSULIN NIGHTLY 100 each 3   blood glucose meter kit and supplies KIT Dispense brand based on patient and insurance preference. Use up to 3 times daily to check blood sugar. DX: ICD 10 E11.9 Diabetes Type 2. 1  each 0   Blood Pressure Monitoring (BLOOD PRESSURE CUFF) MISC 1 each by Does not apply route daily. 1 each 0   carvedilol (COREG) 25 MG tablet TAKE 1 TABLET (25 MG TOTAL) BY MOUTH TWICE A DAY WITH MEALS 60 tablet 0   gabapentin (NEURONTIN) 400 MG capsule Take 3 capsules (1,200 mg total) by mouth 3 (three) times daily. 270 capsule 11   hydrALAZINE (APRESOLINE) 100 MG tablet TAKE 1 TABLET BY MOUTH 3 TIMES DAILY. 90 tablet 0   Lancets Misc. MISC Use  Brand compatable to insurance and monitor to check blood sugar up to 3 times daily. ICD10 E11.9 100 each 12   LANTUS SOLOSTAR 100 UNIT/ML Solostar Pen INJECT 40 UNITS INTO THE SKIN AT BEDTIME. 15 mL 2   furosemide (LASIX) 40 MG tablet Take 1 tablet (40 mg total) by mouth daily. 90 tablet 1   isosorbide mononitrate (IMDUR) 60 MG 24 hr tablet Take 1 tablet (60 mg total) by mouth 2 (two) times daily. 180 tablet 1   telmisartan (MICARDIS) 40 MG tablet Take 1 tablet (40 mg total) by mouth daily. (Patient not taking: Reported on 06/20/2022) 90 tablet 1   No current facility-administered medications for this visit.    Allergies:   Sulfamethoxazole-trimethoprim, Amlodipine, Lisinopril, Jardiance [empagliflozin], and Terbinafine and related    Social History:  The patient  reports that he has never smoked. He has never used smokeless tobacco. He reports current alcohol use. He reports that he does not use drugs.   Family History:  The patient's family history includes Heart disease in his maternal grandfather; Hypertension in his mother.    ROS:  Please see the history of present illness.   Otherwise, review of systems are positive for none.   All other systems are reviewed and negative.    PHYSICAL EXAM: VS:  BP (!) 138/90 (BP Location: Left Arm, Patient Position: Sitting, Cuff Size: Normal)   Pulse (!) 58   Ht _0  (1.727 m)   Wt 209 lb 2 oz (94.9 kg)   SpO2 94%   BMI 31.80 kg/m  , BMI Body mass index is 31.8 kg/m. GEN: Well nourished, well  developed, in no acute distress  HEENT: normal  Neck: No  carotid bruits, or masses.  Mild JVD Cardiac: RRR; no murmurs, rubs, or gallops, mild bilateral leg edema Respiratory:  clear to auscultation bilaterally, normal work of breathing GI: soft, nontender, nondistended, + BS MS: no deformity or atrophy  Skin: warm and dry, no rash Neuro:  Strength and sensation are intact Psych: euthymic mood, full affect   EKG:  EKG is  ordered today. EKG showed normal sinus rhythm with left  bundle branch block.  This is chronic.   Recent Labs: 01/21/2022: ALT 19; BUN 38; Creat 2.40; Hemoglobin 13.8; Platelets 188; Potassium 4.5; Sodium 138    Lipid Panel    Component Value Date/Time   CHOL 141 01/21/2022 1016   CHOL 173 03/16/2021 1503   TRIG 214 (H) 01/21/2022 1016   HDL 42 01/21/2022 1016   HDL 36 (L) 03/16/2021 1503   CHOLHDL 3.4 01/21/2022 1016   VLDL 34 10/20/2016 0515   LDLCALC 71 01/21/2022 1016   LDLDIRECT 74 03/16/2021 1503      Wt Readings from Last 3 Encounters:  06/20/22 209 lb 2 oz (94.9 kg)  01/21/22 208 lb 9.6 oz (94.6 kg)  11/27/21 212 lb 2 oz (96.2 kg)          10/28/2016    3:28 PM  PAD Screen  Previous PAD dx? No  Previous surgical procedure? No  Pain with walking? No  Feet/toe relief with dangling? No  Painful, non-healing ulcers? No  Extremities discolored? No      ASSESSMENT AND PLAN:  1.  Coronary artery disease: Status post LAD stenting in April 2018.  He has done well since then without any recurrent chest pain or dyspnea.      Continue aspirin indefinitely.  2.  Acute on chronic systolic heart failure due to Ischemic cardiomyopathy/HFrEF: Most recent ejection fraction was 45 to 50%.  He appears to be mildly volume overloaded.  I elected to increase furosemide to 40 mg once daily.  Check basic metabolic profile in 1 week.  Continue treatment with carvedilol.  He is also on hydralazine and Imdur.  Given elevated blood pressure, increased Imdur to  60 mg twice daily.  Given advanced chronic kidney disease with GFR of 25-30, he is not on Entresto or spironolactone.  In addition, he could not afford [place] and Vania Rea was associated with generalized rash. I am referring him to advanced heart failure to help with management.  If symptoms do not improve, consider a right heart catheterization.   3.  Essential hypertension: Blood pressure is mildly elevated today.  Imdur was increased.   4.  Hyperlipidemia: Continue treatment with atorvastatin.  Most recent lipid profile showed an LDL of 74 which is close to target.   5.  Type 2 diabetes mellitus: Most recent hemoglobin A1c was 8.9.    6.  Stage III chronic kidney disease: Stable    Disposition:   I referred him to Dr. Haroldine Laws.  FU with me in 6 months  Signed,  [name], MD  06/20/2022 10:54 AM    [place]

## 2022-06-27 ENCOUNTER — Other Ambulatory Visit
Admission: RE | Admit: 2022-06-27 | Discharge: 2022-06-27 | Disposition: A | Discharge status: Home / Self Care | Payer: Medicaid Other | Attending: Cardiovascular Disease | Admitting: Cardiovascular Disease

## 2022-06-27 DIAGNOSIS — I5022 Chronic systolic (congestive) heart failure: Secondary | ICD-10-CM | POA: Insufficient documentation

## 2022-06-27 LAB — BASIC METABOLIC PANEL
Anion gap: 5 (ref 5–15)
BUN: 62 mg/dL — ABNORMAL HIGH (ref 8–23)
CO2: 25 mmol/L (ref 22–32)
Calcium: 7.7 mg/dL — ABNORMAL LOW (ref 8.9–10.3)
Chloride: 106 mmol/L (ref 98–111)
Creatinine, Ser: 3.35 mg/dL — ABNORMAL HIGH (ref 0.61–1.24)
GFR, Estimated: 20 mL/min — ABNORMAL LOW (ref >60)
Glucose, Bld: 194 mg/dL — ABNORMAL HIGH (ref 70–99)
Potassium: 4.2 mmol/L (ref 3.5–5.1)
Sodium: 136 mmol/L (ref 135–145)

## 2022-06-28 ENCOUNTER — Telehealth: Payer: Self-pay | Admitting: *Deleted

## 2022-06-28 ENCOUNTER — Other Ambulatory Visit (HOSPITAL_COMMUNITY): Payer: Self-pay

## 2022-06-28 DIAGNOSIS — I5022 Chronic systolic (congestive) heart failure: Secondary | ICD-10-CM

## 2022-06-28 NOTE — Telephone Encounter (Signed)
Patient has been made aware of results and instructions from recent lab work. He will have a repeat BMET on Monday and keep his new patient appointment with Dr. Haroldine Laws on Monday 12/18.  Per Dr. Fletcher Anon, the patient should hold Furosemide, increase fluid intake and repeat a BMET on Monday.

## 2022-07-01 ENCOUNTER — Ambulatory Visit (HOSPITAL_BASED_OUTPATIENT_CLINIC_OR_DEPARTMENT_OTHER): Payer: Medicaid Other | Admitting: Internal Medicine

## 2022-07-01 ENCOUNTER — Other Ambulatory Visit
Admission: RE | Admit: 2022-07-01 | Discharge: 2022-07-01 | Disposition: A | Discharge status: Home / Self Care | Payer: Medicaid Other | Source: Ambulatory Visit | Attending: Internal Medicine | Admitting: Internal Medicine

## 2022-07-01 VITALS — BP 150/74 | HR 58 | Wt 213.1 lb

## 2022-07-01 DIAGNOSIS — I5022 Chronic systolic (congestive) heart failure: Secondary | ICD-10-CM | POA: Diagnosis not present

## 2022-07-01 DIAGNOSIS — E1142 Type 2 diabetes mellitus with diabetic polyneuropathy: Secondary | ICD-10-CM

## 2022-07-01 DIAGNOSIS — N184 Chronic kidney disease, stage 4 (severe): Secondary | ICD-10-CM | POA: Diagnosis not present

## 2022-07-01 DIAGNOSIS — R0683 Snoring: Secondary | ICD-10-CM | POA: Diagnosis not present

## 2022-07-01 DIAGNOSIS — I2510 Atherosclerotic heart disease of native coronary artery without angina pectoris: Secondary | ICD-10-CM | POA: Diagnosis not present

## 2022-07-01 DIAGNOSIS — I447 Left bundle-branch block, unspecified: Secondary | ICD-10-CM

## 2022-07-01 LAB — BASIC METABOLIC PANEL
Anion gap: 6 (ref 5–15)
BUN: 59 mg/dL — ABNORMAL HIGH (ref 8–23)
CO2: 22 mmol/L (ref 22–32)
Calcium: 8.3 mg/dL — ABNORMAL LOW (ref 8.9–10.3)
Chloride: 110 mmol/L (ref 98–111)
Creatinine, Ser: 3.11 mg/dL — ABNORMAL HIGH (ref 0.61–1.24)
GFR, Estimated: 22 mL/min — ABNORMAL LOW (ref >60)
Glucose, Bld: 157 mg/dL — ABNORMAL HIGH (ref 70–99)
Potassium: 4.1 mmol/L (ref 3.5–5.1)
Sodium: 138 mmol/L (ref 135–145)

## 2022-07-01 LAB — BRAIN NATRIURETIC PEPTIDE: B Natriuretic Peptide: 970.4 pg/mL — ABNORMAL HIGH (ref 0.0–100.0)

## 2022-07-01 MED ORDER — DOXAZOSIN MESYLATE 1 MG PO TABS: 1.0000 mg | ORAL_TABLET | Freq: Every day | ORAL | 3 refills | Status: DC

## 2022-07-01 NOTE — Patient Instructions (Signed)
Medication Changes:  Continue Furosemide 40 mg every Monday, Wednesday, and Friday  START Doxazosin 1 mg Daily AT BEDTIME  Lab Work:  Labs are needed today, please go to the [employer] for this. Labs done today, your results will be available in MyChart, we will contact you for abnormal readings.   Testing/Procedures:  Your physician has requested that you have an echocardiogram. Echocardiography is a painless test that uses sound waves to create images of your heart. It provides your doctor with information about the size and shape of your heart and how well your heart's chambers and valves are working. This procedure takes approximately one hour. There are no restrictions for this procedure. Please do NOT wear cologne, perfume, aftershave, or lotions (deodorant is allowed). Please arrive 15 minutes prior to your appointment time.  Your physician has recommended that you have a sleep study. This test records several body functions during sleep, including: brain activity, eye movement, oxygen and carbon dioxide blood levels, heart rate and rhythm, breathing rate and rhythm, the flow of air through your mouth and nose, snoring, body muscle movements, and chest and belly movement.  Referrals:  none  Special Instructions // Education:  Do the following things EVERYDAY: Weigh yourself in the morning before breakfast. Write it down and keep it in a log. Take your medicines as prescribed Eat low salt foods--Limit salt (sodium) to 2000 mg per day.  Stay as active as you can everyday Limit all fluids for the day to less than 2 liters   Follow-Up in: 6 weeks    If you have any questions or concerns before your next appointment please send [place] a message through mychart or call our office at [phone] Monday-Friday 8 am-5 pm.   If you have an urgent need after hours on the weekend please call your Primary Cardiologist or the [facility] in [place] at  [phone].

## 2022-07-01 NOTE — Progress Notes (Signed)
ADVANCED HF CLINIC CONSULT NOTE  Referring Physician:Muhammad Fletcher Anon, MD Primary Care: Olin Hauser, DO Primary Cardiologist: [name], MD   HPI:  [name] is a 61 y.o. male with CAD, chronic systolic HF, DM2, HTN, CKD IV, ETOH abuse and PAD. Referred by Dr. Fletcher Anon for further evaluation of his HF and possible cardiac amyloidosis.   He is status post drug-eluting stent placement to the LAD in April 2018 in setting of acute HF.  At that time, he was found to have severe LV dysfunction with an EF of 25 to 30% and diffuse hypokinesis which was felt to be out of proportion to the LAD disease.  He also has a history of alcohol abuse.  A follow-up echocardiogram in July 2018 showed improved LV function with an EF of 40 to 45%.    Echo 2/22 showed an EF of 45 to 50%.  There was concern about possible cardiac amyloidosis.  PYP scan was equivocal for amyloidosis   He was prescribed Farxiga by nephrology but was not covered.  Jardiance had to be stopped due to a rash.  Here with his wife. Now on disability. Says he is tired. Wife says he snores some. Can walk to the mailbox but has to stop due to RLE pain and SOB. Can go to the store and get what he needs. No CP. Occasional edema. Saw Nephrology in past but has not followed up in long time. Does not follow DM2 closely. Says glucose about 150 when he checks it. HgbA1c in 7/23 was 7.9    Review of Systems: [y] = yes, _0  = no   General: Weight gain _1 ; Weight loss _2 ; Anorexia _3 ; Fatigue Blue.Reese ]; Fever _4 ; Chills _5 ; Weakness _6   Cardiac: Chest pain/pressure _7 ; Resting SOB _8 ; Exertional SOB [ y]; Orthopnea _9 ; Pedal Edema Blue.Reese ]; Palpitations _10 ; Syncope _11 ; Presyncope _12 ; Paroxysmal nocturnal dyspnea_13   Pulmonary: Cough _14 ; Wheezing_15 ; Hemoptysis_16 ; Sputum _17 ; Snoring [ y]  GI: Vomiting_18 ; Dysphagia_19 ; Melena_20 ; Hematochezia _21 ; Heartburn_22 ; Abdominal pain _23 ; Constipation _24 ; Diarrhea _25 ; BRBPR _26    GU: Hematuria_27 ; Dysuria _28 ; Nocturia_29   Vascular: Pain in legs with walking [ y]; Pain in feet with lying flat _30 ; Non-healing sores _31 ; Stroke _32 ; TIA _33 ; Slurred speech _34 ;  Neuro: Headaches_35 ; Vertigo_36 ; Seizures_37 ; Paresthesias_38 ;Blurred vision _39 ; Diplopia _40 ; Vision changes _41   Ortho/Skin: Arthritis [ y]; Joint pain Blue.Reese ]; Muscle pain _42 ; Joint swelling Blue.Reese ]; Back Pain _43 ; Rash _44   Psych: Depression_45 ; Anxiety_46   Heme: Bleeding problems _47 ; Clotting disorders _48 ; Anemia _49   Endocrine: Diabetes _50 ; Thyroid dysfunction_51    Past Medical History:  Diagnosis Date   Ankle pain    Chronic combined systolic (congestive) and diastolic (congestive) heart failure ([place]) 10/2016   a. 10/2016 Echo: EF 25-30%, diff HK; b. 01/2017 Echo:  EF 40-45%, GrI DD; c. 08/2017 Echo: EF 40-45%, GrI DD; d. 08/2020 Echo: EF 45-50%, glob HK. Mod asymm LVH, GrI DD, Nl RV size/fxn; e. 08/2020 PYP: equivocal for ATTR cardiac amyloid.   CKD (chronic kidney disease), stage III - IV ([place])    Coronary artery disease    a. 10/2016 Cath/PCI: LAD 72m(3.5x15 Xience Alpine DES). No other obstructive disease.  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 10/2016 Echo: EF 25-30%, diff HK; b. 01/2017 Echo:  EF 40-45%, GrI DD; c. 08/2017 Echo: EF 40-45%, GrI DD; d. 08/2020 Echo: EF 45-50%, glob HK. Mod asymm LVH, GrI DD, Nl RV size/fxn.   Pain in both feet     Current Outpatient Medications  Medication Sig Dispense Refill   ACCU-CHEK FASTCLIX LANCETS MISC Check sugar up to 3 x daily as instructed 102 each 12   ACCU-CHEK GUIDE test strip Check blood sugar up to 3 times daily as advised 100 each 12   acetaminophen (TYLENOL 8 HOUR) 650 MG CR tablet Take 1 tablet (650 mg total) by mouth every 8 (eight) hours as needed for pain.     aspirin EC 81 MG tablet Take 81 mg by mouth.      atorvastatin (LIPITOR) 40 MG tablet TAKE 1 TABLET  BY MOUTH EVERY DAY 30 tablet 0   B-D ULTRAFINE III SHORT PEN 31G X 8 MM MISC USE TO INJECT INSULIN NIGHTLY 100 each 3   blood glucose meter kit and supplies KIT Dispense brand based on patient and insurance preference. Use up to 3 times daily to check blood sugar. DX: ICD 10 E11.9 Diabetes Type 2. 1 each 0   Blood Pressure Monitoring (BLOOD PRESSURE CUFF) MISC 1 each by Does not apply route daily. 1 each 0   carvedilol (COREG) 25 MG tablet TAKE 1 TABLET (25 MG TOTAL) BY MOUTH TWICE A DAY WITH MEALS 60 tablet 0   furosemide (LASIX) 40 MG tablet Take 1 tablet (40 mg total) by mouth daily. 90 tablet 1   gabapentin (NEURONTIN) 400 MG capsule Take 3 capsules (1,200 mg total) by mouth 3 (three) times daily. 270 capsule 11   hydrALAZINE (APRESOLINE) 100 MG tablet TAKE 1 TABLET BY MOUTH 3 TIMES DAILY. 90 tablet 0   isosorbide mononitrate (IMDUR) 60 MG 24 hr tablet Take 1 tablet (60 mg total) by mouth 2 (two) times daily. 180 tablet 1   Lancets Misc. MISC Use  Brand compatable to insurance and monitor to check blood sugar up to 3 times daily. ICD10 E11.9 100 each 12   LANTUS SOLOSTAR 100 UNIT/ML Solostar Pen INJECT 40 UNITS INTO THE SKIN AT BEDTIME. 15 mL 2   No current facility-administered medications for this visit.    Allergies  Allergen Reactions   Sulfamethoxazole-Trimethoprim Other (See Comments)    AKI    Amlodipine Other (See Comments)    Edema   Lisinopril     Hyperkalemia    Jardiance [Empagliflozin] Rash   Terbinafine And Related Rash      Social History   Socioeconomic History   Marital status: Married    Spouse name: Not on file   Number of children: Not on file   Years of education: Not on file   Highest education level: Not on file  Occupational History   Not on file  Tobacco Use   Smoking status: Never   Smokeless tobacco: Never  Vaping Use   Vaping Use: Never used  Substance and Sexual Activity   Alcohol use: Yes    Comment: occassionally   Drug use: No    Sexual activity: Not on file  Other Topics Concern   Not on file  Social History Narrative   Not on file   Social Determinants of Health   Financial Resource Strain:  Not on file  Food Insecurity: Not on file  Transportation Needs: Not on file  Physical Activity: Not on file  Stress: Not on file  Social Connections: Not on file  Intimate Partner Violence: Not on file      Family History  Problem Relation Age of Onset   Hypertension Mother    Heart disease Maternal Grandfather     Vitals:   07/01/22 1443  BP: (!) 150/74  Pulse: (!) 58  SpO2: 97%  Weight: 213 lb 2 oz (96.7 kg)    PHYSICAL EXAM: General:  Well appearing. No respiratory difficulty HEENT: normal Neck: supple. JVP 6-7  Carotids 2+ bilat; no bruits. No lymphadenopathy or thryomegaly appreciated. Cor: PMI nondisplaced. Regular rate & rhythm. No rubs, gallops or murmurs. Lungs: clear Abdomen: obese soft, nontender, nondistended. No hepatosplenomegaly. No bruits or masses. Good bowel sounds. Extremities: no cyanosis, clubbing, rash, tr-1+ edema Neuro: alert & oriented x 3, cranial nerves grossly intact. moves all 4 extremities w/o difficulty. Affect pleasant.  ECG: SB 56 LBBB 12m Personally reviewed  REDs 41%   ASSESSMENT & PLAN:  1.  Chronic systolic heart failure due to Ischemic cardiomyopathy/HFrEF:  - Echo 4/18 EF 20-25% - Echo 2/22 EF 45-50%. Mod LVH - PYP 2/22 reviewed. H/CL 1.17 Negative for amyloid - NYHA II-III - Volume status mildly elevated. REDS 41%. Restart lasix 40 MWF (may need more) - Not on  Entresto or spironolactone due to CKD IV. Repeat labs today - Unable to afford FWilder Gladeand JVania Reawas associated with generalized rash.  - Continue hydralazine 100 tid and imdur 60 daily - CKD IV with most recent SCr on 06/27/22 = 3.3 (previously 2.4 in 7/23) - Not candidate for advanced therapies with CKD IV - I reviewed echo and PYP images personally. I doubt this is amyloid suspect LVH due  to HTN. Can check cMRI as needed (see below) - With progressive renal dysfunction will need f/u. Check SPEP/myeloma panel at next visit - Unclear how much of his current situation related to HF versus progressive renal dysfunction and other issues - Will need echo ASAP. Check labs - If EF down will get cMRI and also need to consider possible LBBB CM  - Ideally needs SGLT2i if/when renal function stable  2.  Coronary artery disease:  - Status post LAD stenting in April 2018.  He has done well since then without any recurrent chest pain or dyspnea.    - not candidate for repeat cath with CKD IV - managed by Dr. AFletcher Anon- continue ASA/statin    3.  Essential hypertension:  -Blood pressure is elevated today.   - start doxazosin 141mdaily  4. DM2: - per PCP   5.  Stage IV chronic kidney disease: - CKD IV with most recent SCr on 06/27/22 = 3.3 (previously 2.4 in 7/23) - Ideally would try to get back on Farxiga but need to make sure Scr stabilized first - f/u Nephrology - labs today  6. Snoring - needs sleep study.  - will schedule   Total time spent 45 minutes. Over half that time spent discussing above.   DaGlori BickersMD  3:57 PM

## 2022-07-01 NOTE — Progress Notes (Signed)
ReDS Vest / Clip - 07/01/22 1600       ReDS Vest / Clip   Station Marker C    Ruler Value 30    ReDS Value Range High volume overload    ReDS Actual Value 41

## 2022-07-12 ENCOUNTER — Other Ambulatory Visit: Payer: Self-pay | Admitting: Nurse Practitioner

## 2022-07-12 DIAGNOSIS — E1169 Type 2 diabetes mellitus with other specified complication: Secondary | ICD-10-CM

## 2022-07-12 DIAGNOSIS — I1A0 Resistant hypertension: Secondary | ICD-10-CM

## 2022-07-17 ENCOUNTER — Ambulatory Visit: Admission: RE | Admit: 2022-07-17 | Payer: Medicaid Other | Source: Ambulatory Visit

## 2022-07-24 ENCOUNTER — Other Ambulatory Visit: Payer: Self-pay | Admitting: Family Medicine

## 2022-07-24 ENCOUNTER — Encounter: Payer: Self-pay | Admitting: Family Medicine

## 2022-07-24 ENCOUNTER — Ambulatory Visit: Payer: Medicaid Other | Admitting: Family Medicine

## 2022-07-24 VITALS — BP 138/86 | HR 57 | Ht 68.0 in | Wt 210.0 lb

## 2022-07-24 DIAGNOSIS — E1142 Type 2 diabetes mellitus with diabetic polyneuropathy: Secondary | ICD-10-CM

## 2022-07-24 DIAGNOSIS — E1169 Type 2 diabetes mellitus with other specified complication: Secondary | ICD-10-CM

## 2022-07-24 DIAGNOSIS — I129 Hypertensive chronic kidney disease with stage 1 through stage 4 chronic kidney disease, or unspecified chronic kidney disease: Secondary | ICD-10-CM

## 2022-07-24 DIAGNOSIS — Z0000 Encounter for general adult medical examination without abnormal findings: Secondary | ICD-10-CM

## 2022-07-24 DIAGNOSIS — N184 Chronic kidney disease, stage 4 (severe): Secondary | ICD-10-CM | POA: Diagnosis not present

## 2022-07-24 DIAGNOSIS — I5042 Chronic combined systolic (congestive) and diastolic (congestive) heart failure: Secondary | ICD-10-CM

## 2022-07-24 DIAGNOSIS — R351 Nocturia: Secondary | ICD-10-CM

## 2022-07-24 LAB — POCT GLYCOSYLATED HEMOGLOBIN (HGB A1C): Hemoglobin A1C: 7.1 % — AB (ref 4.0–5.6)

## 2022-07-24 NOTE — Progress Notes (Signed)
Subjective:    Patient ID: [name], male    DOB: 05/16/1961, 62 y.o.   MRN: [id number]  [name] is a 62 y.o. male presenting on 07/24/2022 for Diabetes and Hypertension   HPI  CHRONIC DM, Type 2 with DM Neuropathy / CKD-IV Obesity BMI >31 Hyperlipidemia    Improved lifestyle diet CBG: improved results Meds: - Lantus 40u daily - OFF Metformin due to CKD. Failed Bydureon in past due to nausea Reports good compliance. Tolerating well w/o side-effects On ARB On Atorvastatin 40mg  daily. Last labs reviewed Lifestyle: - Diet (Admits improved DM diet) - Exercise (Limited regular exericse) Diabet Eye Exam done 05/03/21 [facility]  Admits intermittent burning, tingling at times in feet, worse at night Denies hypoglycemia, polyuria, visual changes   Hypertension / CKD-IV CHF, Diastolic, Systolic Combined He has been followed by [facility] Dr Holley Raring recommended SGLT2 for CHF and DM/HTN He remains off Metformin On Telmisartan 40mg  daily., Carvedilol 25 BID, Hydralazine, Doxazosin 1mg    Muscle cramps Episodic nocturnal muscle cramp.       07/24/2022    8:38 AM 01/21/2022   12:47 PM 01/04/2021    8:11 AM  Depression screen PHQ 2/9  Decreased Interest 1 0 0  Down, Depressed, Hopeless 0 0 0  PHQ - 2 Score 1 0 0  Altered sleeping 2  3  Tired, decreased energy 2  3  Change in appetite 0  0  Feeling bad or failure about yourself  0  0  Trouble concentrating 0  0  Moving slowly or fidgety/restless 0  0  Suicidal thoughts 0  0  PHQ-9 Score 5  6  Difficult doing work/chores Not difficult at all  Not difficult at all    Social History   Tobacco Use   Smoking status: Never   Smokeless tobacco: Never  Vaping Use   Vaping Use: Never used  Substance Use Topics   Alcohol use: Yes    Comment: occassionally   Drug use: No    Review of Systems Per HPI unless specifically indicated above     Objective:    BP 138/86   Pulse (!) 57   Ht 5\' 8"   (1.727 m)   Wt 210 lb (95.3 kg)   SpO2 98%   BMI 31.93 kg/m   Wt Readings from Last 3 Encounters:  07/24/22 210 lb (95.3 kg)  07/01/22 213 lb 2 oz (96.7 kg)  06/20/22 209 lb 2 oz (94.9 kg)    Physical Exam Vitals and nursing note reviewed.  Constitutional:      General: He is not in acute distress.    Appearance: He is well-developed. He is not diaphoretic.     Comments: Well-appearing, comfortable, cooperative  HENT:     Head: Normocephalic and atraumatic.  Eyes:     General:        Right eye: No discharge.        Left eye: No discharge.     Conjunctiva/sclera: Conjunctivae normal.  Neck:     Thyroid: No thyromegaly.  Cardiovascular:     Rate and Rhythm: Normal rate and regular rhythm.     Pulses: Normal pulses.     Heart sounds: Normal heart sounds. No murmur heard. Pulmonary:     Effort: Pulmonary effort is normal. No respiratory distress.     Breath sounds: Normal breath sounds. No wheezing or rales.  Musculoskeletal:        General: Normal range of motion.  Cervical back: Normal range of motion and neck supple.  Lymphadenopathy:     Cervical: No cervical adenopathy.  Skin:    General: Skin is warm and dry.     Findings: No erythema or rash.  Neurological:     Mental Status: He is alert and oriented to person, place, and time. Mental status is at baseline.  Psychiatric:        Behavior: Behavior normal.     Comments: Well groomed, good eye contact, normal speech and thoughts    Results for orders placed or performed in visit on 07/24/22  POCT HgB A1C  Result Value Ref Range   Hemoglobin A1C 7.1 (A) 4.0 - 5.6 %      Assessment & Plan:   Problem List Items Addressed This Visit     Benign hypertension with CKD (chronic kidney disease) stage IV (HCC)    Improved BP in range Complication with CKD-IIV worsening, CAD    Plan:  1. Continue current BP regimen - Carvedilol 25mg  BID, Hydralazine 100mg  q 8 hr, isosorbide mononitrate imdur 90mg , Telmisartan  40mg , Doxazosin 2. Encourage improved lifestyle - low sodium diet, regular exercise 3. 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 Continue Lantus 40 unit daily for now, gradual increase if fasting sugar >150 consistently. 2. Encourage improved lifestyle - low carb, low sugar diet, reduce portion size, continue improving regular exercise 3. Check CBG, bring log to next visit for review 4. Continue ASA, ARB, Statin      Relevant Orders   POCT HgB A1C (Completed)   Urine Microalbumin w/creat. ratio    No orders of the defined types were placed in this encounter.  Orders Placed This Encounter  Procedures   Urine Microalbumin w/creat. ratio   POCT HgB A1C      Follow up plan: Return in about 6 months (around 01/22/2023) for 6 month fasting lab only then 1 week later Annual Physical.  Future labs ordered for 01/22/23    [name], [facility] 07/24/2022, 8:54 AM

## 2022-07-24 NOTE — Assessment & Plan Note (Signed)
Improved BP in range Complication with CKD-IIV worsening, CAD    Plan:  1. Continue current BP regimen - Carvedilol 25mg  BID, Hydralazine 100mg  q 8 hr, isosorbide mononitrate imdur 90mg , Telmisartan 40mg , Doxazosin 2. Encourage improved lifestyle - low sodium diet, regular exercise 3. Continue monitor BP outside office, bring readings to next visit, if persistently >140/90 or new symptoms notify office sooner

## 2022-07-24 NOTE — Assessment & Plan Note (Signed)
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 Continue Lantus 40 unit daily for now, gradual increase if fasting sugar >150 consistently. 2. Encourage improved lifestyle - low carb, low sugar diet, reduce portion size, continue improving regular exercise 3. Check CBG, bring log to next visit for review 4. Continue ASA, ARB, Statin

## 2022-07-24 NOTE — Patient Instructions (Addendum)
Thank you for coming to the office today.  Recommend Eye Doctor visit.  [facility]  [place], [place] [address]  Phone: [phone]    Recent Labs    01/21/22 1016 07/24/22 0854  HGBA1C 7.9* 7.1*   Leg cramps  Recommend increased hydration and electrolytes  - Try spoonful of yellow mustard to relieve leg cramps or try daily to prevent the problem  - OTC natural option is Hyland's Leg Cramps (Dissolving tablet) take as needed for muscle cramps   Please schedule a Follow-up Appointment to: Return in about 6 months (around 01/22/2023) for 6 month fasting lab only then 1 week later Annual Physical.  If you have any other questions or concerns, please feel free to call the office or send a message through [place]. You may also schedule an earlier appointment if necessary.  Additionally, you may be receiving a survey about your experience at our office within a few days to 1 week by e-mail or mail. We value your feedback.  [name], DO [place]

## 2022-07-25 LAB — MICROALBUMIN / CREATININE URINE RATIO
Creatinine, Urine: 25 mg/dL (ref 20–320)
Microalb Creat Ratio: 2040 mcg/mg creat — ABNORMAL HIGH (ref <30)
Microalb, Ur: 51 mg/dL

## 2022-08-08 ENCOUNTER — Ambulatory Visit
Admission: RE | Admit: 2022-08-08 | Discharge: 2022-08-08 | Disposition: A | Discharge status: Home / Self Care | Payer: Medicaid Other | Source: Ambulatory Visit | Attending: Internal Medicine | Admitting: Internal Medicine

## 2022-08-08 DIAGNOSIS — I5022 Chronic systolic (congestive) heart failure: Secondary | ICD-10-CM | POA: Insufficient documentation

## 2022-08-08 DIAGNOSIS — I2510 Atherosclerotic heart disease of native coronary artery without angina pectoris: Secondary | ICD-10-CM | POA: Diagnosis not present

## 2022-08-08 DIAGNOSIS — I3481 Nonrheumatic mitral (valve) annulus calcification: Secondary | ICD-10-CM | POA: Diagnosis not present

## 2022-08-08 DIAGNOSIS — I110 Hypertensive heart disease with heart failure: Secondary | ICD-10-CM | POA: Insufficient documentation

## 2022-08-08 DIAGNOSIS — E785 Hyperlipidemia, unspecified: Secondary | ICD-10-CM | POA: Insufficient documentation

## 2022-08-08 LAB — ECHOCARDIOGRAM COMPLETE
AR max vel: 2.09 cm2
AV Area VTI: 2.08 cm2
AV Area mean vel: 2.26 cm2
AV Mean grad: 3 mmHg
AV Peak grad: 5.7 mmHg
Ao pk vel: 1.19 m/s
Area-P 1/2: 4.74 cm2
S' Lateral: 3.1 cm

## 2022-08-08 NOTE — Progress Notes (Signed)
*  PRELIMINARY RESULTS* Echocardiogram 2D Echocardiogram has been performed.  [name] 08/08/2022, 10:26 AM

## 2022-08-11 NOTE — Progress Notes (Signed)
ADVANCED HF CLINIC NOTE  Referring Physician:Muhammad Fletcher Anon, MD Primary Care: Olin Hauser, DO Primary Cardiologist: [name], MD   HPI:  [name] is a 62 y.o. male with CAD, chronic systolic HF, DM2, HTN, CKD IV, ETOH abuse and PAD. Referred by Dr. Fletcher Anon for further evaluation of his HF and possible cardiac amyloidosis.   He is status post drug-eluting stent placement to the LAD in April 2018 in setting of acute HF.  At that time, he was found to have severe LV dysfunction with an EF of 25 to 30% and diffuse hypokinesis which was felt to be out of proportion to the LAD disease.  He also has a history of alcohol abuse.  A follow-up echocardiogram in July 2018 showed improved LV function with an EF of 40 to 45%.    Echo 2/22 showed an EF of 45 to 50%.  There was concern about possible cardiac amyloidosis.  PYP scan was equivocal for amyloidosis   He was prescribed Farxiga by nephrology but was not covered.  Jardiance had to be stopped due to a rash.  On disability.   I saw him for first time 12/23. Lasix restarted in setting of ReDS 41%. Sleep study ordered. Doxazosin added for HTN. SCr 3.11. Referred to Nephrology.   Here for f/u. Says he just doesn't have any energy.  Now taking lasix 40mg  every day. Wife says he snores. No CP. Dyspnea on mild exertion.     Echo 08/08/22 EF 55-60%    Past Medical History:  Diagnosis Date   Ankle pain    Chronic combined systolic (congestive) and diastolic (congestive) heart failure ([place]) 10/2016   a. 10/2016 Echo: EF 25-30%, diff HK; b. 01/2017 Echo:  EF 40-45%, GrI DD; c. 08/2017 Echo: EF 40-45%, GrI DD; d. 08/2020 Echo: EF 45-50%, glob HK. Mod asymm LVH, GrI DD, Nl RV size/fxn; e. 08/2020 PYP: equivocal for ATTR cardiac amyloid.   CKD (chronic kidney disease), stage III - IV ([place])    Coronary artery disease    a. 10/2016 Cath/PCI: LAD 78m (3.5x15 Xience Alpine DES). No other obstructive disease.   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 10/2016 Echo: EF 25-30%, diff HK; b. 01/2017 Echo:  EF 40-45%, GrI DD; c. 08/2017 Echo: EF 40-45%, GrI DD; d. 08/2020 Echo: EF 45-50%, glob HK. Mod asymm LVH, GrI DD, Nl RV size/fxn.   Pain in both feet     Current Outpatient Medications  Medication Sig Dispense Refill   ACCU-CHEK FASTCLIX LANCETS MISC Check sugar up to 3 x daily as instructed 102 each 12   ACCU-CHEK GUIDE test strip Check blood sugar up to 3 times daily as advised 100 each 12   acetaminophen (TYLENOL 8 HOUR) 650 MG CR tablet Take 1 tablet (650 mg total) by mouth every 8 (eight) hours as needed for pain.     aspirin EC 81 MG tablet Take 81 mg by mouth.      atorvastatin (LIPITOR) 40 MG tablet TAKE 1 TABLET BY MOUTH EVERY DAY 90 tablet 1   B-D ULTRAFINE III SHORT PEN 31G X 8 MM MISC USE TO INJECT INSULIN NIGHTLY 100 each 3   blood glucose meter kit and supplies KIT Dispense brand based on patient and insurance preference. Use up to 3 times daily to check blood sugar. DX: ICD 10 E11.9 Diabetes Type 2. 1  each 0   carvedilol (COREG) 25 MG tablet TAKE 1 TABLET (25 MG TOTAL) BY MOUTH TWICE A DAY WITH MEALS 180 tablet 1   doxazosin (CARDURA) 1 MG tablet Take 1 tablet (1 mg total) by mouth at bedtime. 30 tablet 3   furosemide (LASIX) 40 MG tablet Take 1 tablet (40 mg total) by mouth daily. 90 tablet 1   gabapentin (NEURONTIN) 400 MG capsule Take 3 capsules (1,200 mg total) by mouth 3 (three) times daily. 270 capsule 11   hydrALAZINE (APRESOLINE) 100 MG tablet Take 100 mg by mouth 2 (two) times daily.     isosorbide mononitrate (IMDUR) 60 MG 24 hr tablet Take 1 tablet (60 mg total) by mouth 2 (two) times daily. 180 tablet 1   Lancets Misc. MISC Use  Brand compatable to insurance and monitor to check blood sugar up to 3 times daily. ICD10 E11.9 100 each 12   LANTUS SOLOSTAR 100 UNIT/ML Solostar Pen INJECT 40 UNITS INTO THE SKIN AT  BEDTIME. 15 mL 2   Blood Pressure Monitoring (BLOOD PRESSURE CUFF) MISC 1 each by Does not apply route daily. 1 each 0   No current facility-administered medications for this visit.    Allergies  Allergen Reactions   Sulfamethoxazole-Trimethoprim Other (See Comments)    AKI    Amlodipine Other (See Comments)    Edema   Lisinopril     Hyperkalemia    Jardiance [Empagliflozin] Rash   Terbinafine And Related Rash      Social History   Socioeconomic History   Marital status: Married    Spouse name: Not on file   Number of children: Not on file   Years of education: Not on file   Highest education level: Not on file  Occupational History   Not on file  Tobacco Use   Smoking status: Never   Smokeless tobacco: Never  Vaping Use   Vaping Use: Never used  Substance and Sexual Activity   Alcohol use: Yes    Comment: occassionally   Drug use: No   Sexual activity: Not on file  Other Topics Concern   Not on file  Social History Narrative   Not on file   Social Determinants of Health   Financial Resource Strain: Not on file  Food Insecurity: Not on file  Transportation Needs: Not on file  Physical Activity: Not on file  Stress: Not on file  Social Connections: Not on file  Intimate Partner Violence: Not on file      Family History  Problem Relation Age of Onset   Hypertension Mother    Heart disease Maternal Grandfather     Vitals:   08/12/22 1435 08/12/22 1515  BP: (!) 150/71 (!) 147/76  Pulse: 61   Resp: 20   SpO2: 97%   Weight: 210 lb (95.3 kg)    Wt Readings from Last 3 Encounters:  08/12/22 210 lb (95.3 kg)  07/24/22 210 lb (95.3 kg)  07/01/22 213 lb 2 oz (96.7 kg)     PHYSICAL EXAM: General:  Well appearing. No resp difficulty HEENT: normal Neck: supple. no JVD. Carotids 2+ bilat; no bruits. No lymphadenopathy or thryomegaly appreciated. Cor: PMI nondisplaced. Regular rate & rhythm. No rubs, gallops or murmurs. Lungs: markedly decreased  throughout Abdomen: soft, nontender, nondistended. No hepatosplenomegaly. No bruits or masses. Good bowel sounds. Extremities: no cyanosis, clubbing, rash, edema Neuro: alert & orientedx3, cranial nerves grossly intact. moves all 4 extremities w/o difficulty. Affect pleasant  ECG: SB 56 LBBB  119ms Personally reviewed  ASSESSMENT & PLAN:  1.  Chronic systolic heart failure due to Ischemic cardiomyopathy/HFrEF:  - Echo 4/18 EF 20-25% - Echo 2/22 EF 45-50%. Mod LVH - Echo 08/08/22 EF 55-60%  - PYP 2/22 reviewed. H/CL 1.17 Negative for amyloid - Stable NYHA III - Volume status imptroved  Continue lasix 40 daily - Not on  Entresto or spironolactone due to CKD IV. - Unable to afford [place] and Jardiance was associated with generalized rash.  - Continue hydralazine 100 tid and imdur 60 daily - I reviewed echo and PYP images personally. I doubt this is amyloid suspect LVH due to HTN.  - Suspect lung disease contributing to his dyspnea. Check PFTs (nonsmoker but extensive secondhand smoke exposure and concerning exam)    2.  Coronary artery disease:  - Status post LAD stenting in April 2018.  - No s/s angina   - not candidate for repeat cath with CKD IV - managed by Dr. Fletcher Anon - continue ASA/statin  - reconsider SCLT2i if he can afford   3.  Essential hypertension:  - Blood pressure remains  elevated today.   - Increase doxazosin to 2mg  daily  4. DM2: - per PCP   5.  Stage IV chronic kidney disease: - CKD IV baseline Scr 3.1-3.3 - Ideally would try to get back on Farxiga but need to make sure Scr stabilized first - f/u Nephrology  6. Snoring - needs sleep study.  - will schedule   Glori Bickers, MD  3:23 PM

## 2022-08-12 ENCOUNTER — Ambulatory Visit: Payer: Medicaid Other | Attending: Internal Medicine | Admitting: Internal Medicine

## 2022-08-12 ENCOUNTER — Encounter: Payer: Self-pay | Admitting: Internal Medicine

## 2022-08-12 VITALS — BP 147/76 | HR 61 | Resp 20 | Wt 210.0 lb

## 2022-08-12 DIAGNOSIS — R0600 Dyspnea, unspecified: Secondary | ICD-10-CM

## 2022-08-12 DIAGNOSIS — R0683 Snoring: Secondary | ICD-10-CM

## 2022-08-12 DIAGNOSIS — I5022 Chronic systolic (congestive) heart failure: Secondary | ICD-10-CM

## 2022-08-12 DIAGNOSIS — N184 Chronic kidney disease, stage 4 (severe): Secondary | ICD-10-CM

## 2022-08-12 DIAGNOSIS — I447 Left bundle-branch block, unspecified: Secondary | ICD-10-CM

## 2022-08-12 DIAGNOSIS — I2510 Atherosclerotic heart disease of native coronary artery without angina pectoris: Secondary | ICD-10-CM

## 2022-08-12 MED ORDER — DOXAZOSIN MESYLATE 1 MG PO TABS: 2.0000 mg | ORAL_TABLET | Freq: Every day | ORAL | 3 refills | Status: DC

## 2022-08-12 NOTE — Patient Instructions (Signed)
Medication Changes:  Increase Doxazosin to 2 mg Daily at bedtime  Lab Work:  none  Testing/Procedures:  Your physician has recommended that you have a pulmonary function test. Pulmonary Function Tests are a group of tests that measure how well air moves in and out of your lungs.  Your physician has recommended that you have a sleep study. This test records several body functions during sleep, including: brain activity, eye movement, oxygen and carbon dioxide blood levels, heart rate and rhythm, breathing rate and rhythm, the flow of air through your mouth and nose, snoring, body muscle movements, and chest and belly movement. A REFERRAL HAS BEEN PLACED TO "[facility]" THEY WILL CALL YOU TO SCHEDULE  Referrals:  You have been referred to [facility], they will call you to schedule  Special Instructions // Education:  Do the following things EVERYDAY: Weigh yourself in the morning before breakfast. Write it down and keep it in a log. Take your medicines as prescribed Eat low salt foods--Limit salt (sodium) to 2000 mg per day.  Stay as active as you can everyday Limit all fluids for the day to less than 2 liters   Follow-Up in: 4 months, we will call you closer to that time to schedule this appointment     If you have any questions or concerns before your next appointment please send [place] a message through mychart or call our office at [phone] Monday-Friday 8 am-5 pm.   If you have an urgent need after hours on the weekend please call your Primary Cardiologist or the [facility] in [place] at [phone].

## 2022-08-16 ENCOUNTER — Telehealth: Payer: Self-pay | Admitting: Family

## 2022-08-16 NOTE — Telephone Encounter (Signed)
Followed up with [facility] about our referral that was sent over for a sleep study. They advised me that they received it and sent it off for insurance verification and once that is done they will calll patient to schedule.    Thaddeaus Monica, NT

## 2022-08-16 NOTE — Telephone Encounter (Signed)
Error

## 2022-08-19 ENCOUNTER — Other Ambulatory Visit: Payer: Self-pay | Admitting: Family Medicine

## 2022-08-19 DIAGNOSIS — E1142 Type 2 diabetes mellitus with diabetic polyneuropathy: Secondary | ICD-10-CM

## 2022-08-20 NOTE — Telephone Encounter (Signed)
Requested Prescriptions  Pending Prescriptions Disp Refills   B-D ULTRAFINE III SHORT PEN 31G X 8 MM MISC [Pharmacy Med Name: BD UF SHORT PEN NEEDLE [id number]] 100 each 0    Sig: USE TO INJECT INSULIN NIGHTLY     Endocrinology: Diabetes - Testing Supplies Passed - 08/19/2022  8:48 AM      Passed - Valid encounter within last 12 months    Recent Outpatient Visits           3 weeks ago DM type 2 with diabetic peripheral neuropathy [facility])   [place], DO   7 months ago Annual physical exam   [facility] Olin Hauser, DO   1 year ago DM type 2 with diabetic peripheral neuropathy [facility])   [facility] Olin Hauser, DO   1 year ago DM type 2 with diabetic peripheral neuropathy [facility])   [facility] Olin Hauser, DO   1 year ago DM type 2 with diabetic peripheral neuropathy [facility])   [place], DO       Future Appointments             In 4 months Fletcher Anon, Mertie Clause, MD [place] at [place]   In 5 months [place] [facility], [facility], [facility]

## 2022-09-03 ENCOUNTER — Ambulatory Visit: Payer: Medicaid Other | Attending: Internal Medicine

## 2022-09-03 DIAGNOSIS — R0600 Dyspnea, unspecified: Secondary | ICD-10-CM | POA: Diagnosis not present

## 2022-09-03 LAB — PULMONARY FUNCTION TEST ARMC ONLY
DL/VA % pred: 75 %
DL/VA: 3.19 ml/min/mmHg/L
DLCO unc % pred: 60 %
DLCO unc: 15.93 ml/min/mmHg
FEF 25-75 Post: 2.79 L/sec
FEF 25-75 Pre: 2.23 L/sec
FEF2575-%Change-Post: 25 %
FEF2575-%Pred-Post: 100 %
FEF2575-%Pred-Pre: 80 %
FEV1-%Change-Post: 17 %
FEV1-%Pred-Post: 84 %
FEV1-%Pred-Pre: 71 %
FEV1-Post: 2.88 L
FEV1-Pre: 2.44 L
FEV1FVC-%Change-Post: 15 %
FEV1FVC-%Pred-Pre: 89 %
FEV6-%Change-Post: 8 %
FEV6-%Pred-Post: 85 %
FEV6-%Pred-Pre: 78 %
FEV6-Post: 3.68 L
FEV6-Pre: 3.39 L
FEV6FVC-%Pred-Post: 105 %
FEV6FVC-%Pred-Pre: 105 %
FVC-%Change-Post: 1 %
FVC-%Pred-Post: 81 %
FVC-%Pred-Pre: 79 %
FVC-Post: 3.68 L
FVC-Pre: 3.63 L
Post FEV1/FVC ratio: 78 %
Post FEV6/FVC ratio: 100 %
Pre FEV1/FVC ratio: 67 %
Pre FEV6/FVC Ratio: 100 %
RV % pred: 122 %
RV: 2.74 L
TLC % pred: 91 %
TLC: 6.23 L

## 2022-09-03 MED ORDER — ALBUTEROL SULFATE (2.5 MG/3ML) 0.083% IN NEBU
2.5000 mg | INHALATION_SOLUTION | Freq: Once | RESPIRATORY_TRACT | Status: AC
  Administered 2022-09-03: 2.5 mg via RESPIRATORY_TRACT

## 2022-10-10 ENCOUNTER — Other Ambulatory Visit: Payer: Self-pay | Admitting: Family Medicine

## 2022-10-10 ENCOUNTER — Other Ambulatory Visit: Payer: Self-pay | Admitting: Cardiovascular Disease

## 2022-10-10 DIAGNOSIS — E1142 Type 2 diabetes mellitus with diabetic polyneuropathy: Secondary | ICD-10-CM

## 2022-10-10 DIAGNOSIS — I1A0 Resistant hypertension: Secondary | ICD-10-CM

## 2022-10-11 NOTE — Telephone Encounter (Signed)
Requested Prescriptions  Pending Prescriptions Disp Refills   LANTUS SOLOSTAR 100 UNIT/ML Solostar Pen [Pharmacy Med Name: LANTUS SOLOSTAR 100 UNIT/ML] 15 mL 1    Sig: INJECT 40 UNITS INTO THE SKIN AT BEDTIME.     Endocrinology:  Diabetes - Insulins Passed - 10/10/2022  5:42 PM      Passed - HBA1C is between 0 and 7.9 and within 180 days    Hemoglobin A1C  Date Value Ref Range Status  07/24/2022 7.1 (A) 4.0 - 5.6 % Final  04/01/2019 7.7  Final    Comment:    [facility]   Hgb A1c MFr Bld  Date Value Ref Range Status  01/21/2022 7.9 (H) <5.7 % of total Hgb Final    Comment:    For someone without known diabetes, a hemoglobin A1c value of 6.5% or greater indicates that they may have  diabetes and this should be confirmed with a follow-up  test. . For someone with known diabetes, a value <7% indicates  that their diabetes is well controlled and a value  greater than or equal to 7% indicates suboptimal  control. A1c targets should be individualized based on  duration of diabetes, age, comorbid conditions, and  other considerations. . Currently, no consensus exists regarding use of hemoglobin A1c for diagnosis of diabetes for children. Renella Cunas - Valid encounter within last 6 months    Recent Outpatient Visits           2 months ago DM type 2 with diabetic peripheral neuropathy [facility])   [place], DO   8 months ago Annual physical exam   [facility] Olin Hauser, DO   1 year ago DM type 2 with diabetic peripheral neuropathy [facility])   [facility] Olin [facility], DO   1 year ago DM type 2 with diabetic peripheral neuropathy [facility])   [place], DO   1 year ago DM type 2 with diabetic peripheral neuropathy [facility])   [place],  DO       Future Appointments             In 2 months Fletcher Anon, Mertie Clause, MD [place] at [place]   In 3 months [place], [facility], [facility]

## 2022-10-24 ENCOUNTER — Telehealth: Payer: Self-pay

## 2022-10-24 NOTE — Telephone Encounter (Signed)
Spoke with pt's wife Lupita Leash regarding appt to have her sleep study with [facility].  RN called [facility] to confirm pt's appt. May 20th @1120 .  Pt and wife  aware, agreeable, and verbalized understanding.

## 2022-12-02 ENCOUNTER — Ambulatory Visit (INDEPENDENT_AMBULATORY_CARE_PROVIDER_SITE_OTHER): Payer: Medicaid Other | Admitting: Internal Medicine

## 2022-12-02 VITALS — BP 146/79 | HR 59 | Resp 14 | Ht 68.0 in | Wt 209.0 lb

## 2022-12-02 DIAGNOSIS — I5042 Chronic combined systolic (congestive) and diastolic (congestive) heart failure: Secondary | ICD-10-CM

## 2022-12-02 DIAGNOSIS — I10 Essential (primary) hypertension: Secondary | ICD-10-CM

## 2022-12-02 DIAGNOSIS — R400 Somnolence: Secondary | ICD-10-CM | POA: Insufficient documentation

## 2022-12-02 NOTE — Progress Notes (Unsigned)
Sleep Medicine   Office Visit  Patient Name: OLAOLUWA FERNS DOB: 04-Jul-1961 MRN [id number]    Chief Complaint: sleep evaluation  Brief History:  Ezekiah presents with a about 2 year history of excessive daytime sleepiness.   Sleep quality is good. This is noted most nights. The patient's bed partner reports no symptoms at night. The patient relates the following symptoms: Excessive daytime sleepiness and leg cramps are also present. The patient goes to sleep at 10 pm and wakes up at 6 am.  Sleep quality is same when outside home environment.  Patient has noted some restlessness of his legs at night.  The patient  relates no unusual behavior during the night.  The patient denies a history of psychiatric problems. The Epworth Sleepiness Score is 5 out of 24 .  The patient relates  Cardiovascular risk factors include: coronary artery disease, cardiomyopathy, hypertension.  The patient reports that he has fatigue, and not much in the way of sleepiness.     ROS  General: (-) fever, (-) chills, (-) night sweat Nose and Sinuses: (-) nasal stuffiness or itchiness, (-) postnasal drip, (-) nosebleeds, (-) sinus trouble. Mouth and Throat: (-) sore throat, (-) hoarseness. Neck: (-) swollen glands, (-) enlarged thyroid, (-) neck pain. Respiratory: - cough, + shortness of breath, - wheezing. Neurologic: + numbness, + tingling. Psychiatric: - anxiety, - depression Sleep behavior: -sleep paralysis -hypnogogic hallucinations -dream enactment      -vivid dreams -cataplexy -night terrors -sleep walking   Current Medication: Outpatient Encounter Medications as of 12/02/2022  Medication Sig   ACCU-CHEK FASTCLIX LANCETS MISC Check sugar up to 3 x daily as instructed   ACCU-CHEK GUIDE test strip Check blood sugar up to 3 times daily as advised   acetaminophen (TYLENOL 8 HOUR) 650 MG CR tablet Take 1 tablet (650 mg total) by mouth every 8 (eight) hours as needed for pain.   aspirin EC 81 MG tablet Take 81  mg by mouth.    atorvastatin (LIPITOR) 40 MG tablet TAKE 1 TABLET BY MOUTH EVERY DAY   blood glucose meter kit and supplies KIT Dispense brand based on patient and insurance preference. Use up to 3 times daily to check blood sugar. DX: ICD 10 E11.9 Diabetes Type 2.   Blood Pressure Monitoring (BLOOD PRESSURE CUFF) MISC 1 each by Does not apply route daily.   carvedilol (COREG) 25 MG tablet TAKE 1 TABLET (25 MG TOTAL) BY MOUTH TWICE A DAY WITH MEALS   doxazosin (CARDURA) 1 MG tablet Take 2 tablets (2 mg total) by mouth at bedtime.   furosemide (LASIX) 40 MG tablet Take 1 tablet (40 mg total) by mouth daily.   gabapentin (NEURONTIN) 400 MG capsule Take 3 capsules (1,200 mg total) by mouth 3 (three) times daily.   hydrALAZINE (APRESOLINE) 100 MG tablet TAKE 1 TABLET BY MOUTH THREE TIMES A DAY   Insulin Pen Needle (B-D ULTRAFINE III SHORT PEN) 31G X 8 MM MISC USE TO INJECT INSULIN NIGHTLY   isosorbide mononitrate (IMDUR) 60 MG 24 hr tablet Take 1 tablet (60 mg total) by mouth 2 (two) times daily.   Lancets Misc. MISC Use  Brand compatable to insurance and monitor to check blood sugar up to 3 times daily. ICD10 E11.9   LANTUS SOLOSTAR 100 UNIT/ML Solostar Pen INJECT 40 UNITS INTO THE SKIN AT BEDTIME.   No facility-administered encounter medications on file as of 12/02/2022.    Surgical History: Past Surgical History:  Procedure Laterality Date   CARDIAC  CATHETERIZATION     CORONARY STENT INTERVENTION N/A 10/21/2016   Procedure: Coronary Stent Intervention;  Surgeon: Iran Ouch, MD;  Location: [facility] INVASIVE CV LAB;  Service: Cardiovascular;  Laterality: N/A;   LEFT HEART CATH AND CORONARY ANGIOGRAPHY N/A 10/21/2016   Procedure: Left Heart Cath and Coronary Angiography;  Surgeon: Iran Ouch, MD;  Location: [facility] INVASIVE CV LAB;  Service: Cardiovascular;  Laterality: N/A;   TIBIA FRACTURE SURGERY Right 2002    Medical History: Past Medical History:  Diagnosis Date   Ankle pain     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 10/2016 Echo: EF 25-30%, diff HK; b. 01/2017 Echo:  EF 40-45%, GrI DD; c. 08/2017 Echo: EF 40-45%, GrI DD; d. 08/2020 Echo: EF 45-50%, glob HK. Mod asymm LVH, GrI DD, Nl RV size/fxn; e. 08/2020 PYP: equivocal for ATTR cardiac amyloid.   CKD (chronic kidney disease), stage III - IV (HCC)    Coronary artery disease    a. 10/2016 Cath/PCI: LAD 55m (3.5x15 Xience Alpine DES). No other obstructive disease.   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 10/2016 Echo: EF 25-30%, diff HK; b. 01/2017 Echo:  EF 40-45%, GrI DD; c. 08/2017 Echo: EF 40-45%, GrI DD; d. 08/2020 Echo: EF 45-50%, glob HK. Mod asymm LVH, GrI DD, Nl RV size/fxn.   Pain in both feet     Family History: Non contributory to the present illness  Social History: Social History   Socioeconomic History   Marital status: Married    Spouse name: Not on file   Number of children: Not on file   Years of education: Not on file   Highest education level: Not on file  Occupational History   Not on file  Tobacco Use   Smoking status: Never   Smokeless tobacco: Never  Vaping Use   Vaping Use: Never used  Substance and Sexual Activity   Alcohol use: Yes    Comment: occassionally   Drug use: No   Sexual activity: Not on file  Other Topics Concern   Not on file  Social History Narrative   Not on file   Social Determinants of Health   Financial Resource Strain: Not on file  Food Insecurity: Not on file  Transportation Needs: Not on file  Physical Activity: Not on file  Stress: Not on file  Social Connections: Not on file  Intimate Partner Violence: Not on file    Vital Signs: Blood pressure (!) 146/79, pulse (!) 59, resp. rate 14, height 5\' 8"  (1.727 m), weight 209 lb (94.8 kg), SpO2 96 %. Body mass index is 31.78 kg/m.   Examination: General  Appearance: The patient is well-developed, well-nourished, and in no distress. Neck Circumference: 43 cm Skin: Gross inspection of skin unremarkable. Head: normocephalic, no gross deformities. Eyes: no gross deformities noted. ENT: ears appear grossly normal Neurologic: Alert and oriented. No involuntary movements.    STOP BANG RISK ASSESSMENT S (snore) Have you been told that you snore?     NO   T (tired) Are you often tired, fatigued, or sleepy during the day?   YES  O (obstruction) Do you stop breathing, choke, or gasp during sleep? NO   P (pressure) Do you have or are you being treated for high blood pressure? YES   B (BMI) Is your body index greater than  35 kg/m? NO   A (age) Are you 18 years old or older? YES   N (neck) Do you have a neck circumference greater than 16 inches?   YES   G (gender) Are you a male? YES   TOTAL STOP/BANG "YES" ANSWERS 5                                                               A STOP-Bang score of 2 or less is considered low risk, and a score of 5 or more is high risk for having either moderate or severe OSA. For people who score 3 or 4, doctors may need to perform further assessment to determine how likely they are to have OSA.         EPWORTH SLEEPINESS SCALE:  Scale:  (0)= no chance of dozing; (1)= slight chance of dozing; (2)= moderate chance of dozing; (3)= high chance of dozing  Chance  Situtation    Sitting and reading: 0    Watching TV: 2    Sitting Inactive in public: 0    As a passenger in car: 0      Lying down to rest: 1    Sitting and talking: 0    Sitting quielty after lunch: 2    In a car, stopped in traffic: 0   TOTAL SCORE:   5 out of 24    SLEEP STUDIES:  No prior sleep study   LABS: Recent Results (from the past 2160 hour(s))  Pulmonary Function Test [facility] Only     Status: None   Collection Time: 09/03/22  1:48 PM  Result Value Ref Range   FVC-Pre 3.63 L   FVC-%Pred-Pre 79 %   FVC-Post  3.68 L   FVC-%Pred-Post 81 %   FVC-%Change-Post 1 %   FEV1-Pre 2.44 L   FEV1-%Pred-Pre 71 %   FEV1-Post 2.88 L   FEV1-%Pred-Post 84 %   FEV1-%Change-Post 17 %   FEV6-Pre 3.39 L   FEV6-%Pred-Pre 78 %   FEV6-Post 3.68 L   FEV6-%Pred-Post 85 %   FEV6-%Change-Post 8 %   Pre FEV1/FVC ratio 67 %   FEV1FVC-%Pred-Pre 89 %   Post FEV1/FVC ratio 78 %   FEV1FVC-%Change-Post 15 %   Pre FEV6/FVC Ratio 100 %   FEV6FVC-%Pred-Pre 105 %   Post FEV6/FVC ratio 100 %   FEV6FVC-%Pred-Post 105 %   FEF 25-75 Pre 2.23 L/sec   FEF2575-%Pred-Pre 80 %   FEF 25-75 Post 2.79 L/sec   FEF2575-%Pred-Post 100 %   FEF2575-%Change-Post 25 %   RV 2.74 L   RV % pred 122 %   TLC 6.23 L   TLC % pred 91 %   DLCO unc 15.93 ml/min/mmHg   DLCO unc % pred 60 %   DL/VA 8.29 ml/min/mmHg/L   DL/VA % pred 75 %    Radiology: ECHOCARDIOGRAM COMPLETE  Result Date: 08/08/2022    ECHOCARDIOGRAM REPORT   Patient Name:   LORNE MEDLEY Date of Exam: 08/08/2022 Medical Rec #:  [id number]         Height:       68.0 in Accession #:    [id number]        Weight:       210.0 lb Date of Birth:  [birth year]  BSA:          2.087 m Patient Age:    61 years          BP:           138/86 mmHg Patient Gender: M                 HR:           57 bpm. Exam Location:  [facility] Procedure: 2D Echo, Cardiac Doppler and Color Doppler Indications:     CHF I50.9  History:         Patient has prior history of Echocardiogram examinations, most                  recent 08/16/2020. CAD; Risk Factors:Dyslipidemia and                  Hypertension.  Sonographer:     Cristela Blue Referring Phys:  [id number] Bevelyn Buckles BENSIMHON Diagnosing Phys: Julien Nordmann MD  Sonographer Comments: Suboptimal apical window and suboptimal subcostal window. IMPRESSIONS  1. Left ventricular ejection fraction, by estimation, is 55 to 60%. The left ventricle has normal function. The left ventricle has no regional wall motion abnormalities. There is moderate concentric left ventricular  hypertrophy. Left ventricular diastolic parameters are consistent with Grade II diastolic dysfunction (pseudonormalization).  2. Right ventricular systolic function is normal. The right ventricular size is normal. There is normal pulmonary artery systolic pressure. The estimated right ventricular systolic pressure is 12.4 mmHg.  3. Left atrial size was mildly dilated.  4. The mitral valve is normal in structure. Mild mitral valve regurgitation. No evidence of mitral stenosis. Moderate mitral annular calcification.  5. The aortic valve is normal in structure. Aortic valve regurgitation is not visualized. Aortic valve sclerosis is present, with no evidence of aortic valve stenosis.  6. The inferior vena cava is normal in size with greater than 50% respiratory variability, suggesting right atrial pressure of 3 mmHg. FINDINGS  Left Ventricle: Left ventricular ejection fraction, by estimation, is 55 to 60%. The left ventricle has normal function. The left ventricle has no regional wall motion abnormalities. The left ventricular internal cavity size was normal in size. There is  moderate concentric left ventricular hypertrophy. Left ventricular diastolic parameters are consistent with Grade II diastolic dysfunction (pseudonormalization). Right Ventricle: The right ventricular size is normal. No increase in right ventricular wall thickness. Right ventricular systolic function is normal. There is normal pulmonary artery systolic pressure. The tricuspid regurgitant velocity is 1.36 m/s, and  with an assumed right atrial pressure of 5 mmHg, the estimated right ventricular systolic pressure is 12.4 mmHg. Left Atrium: Left atrial size was mildly dilated. Right Atrium: Right atrial size was normal in size. Pericardium: There is no evidence of pericardial effusion. Mitral Valve: The mitral valve is normal in structure. Moderate mitral annular calcification. Mild mitral valve regurgitation. No evidence of mitral valve stenosis.  Tricuspid Valve: The tricuspid valve is normal in structure. Tricuspid valve regurgitation is not demonstrated. No evidence of tricuspid stenosis. Aortic Valve: The aortic valve is normal in structure. Aortic valve regurgitation is not visualized. Aortic valve sclerosis is present, with no evidence of aortic valve stenosis. Aortic valve mean gradient measures 3.0 mmHg. Aortic valve peak gradient measures 5.7 mmHg. Aortic valve area, by VTI measures 2.08 cm. Pulmonic Valve: The pulmonic valve was normal in structure. Pulmonic valve regurgitation is not visualized. No evidence of pulmonic stenosis. Aorta: The aortic root is normal in size and  structure. Venous: The inferior vena cava is normal in size with greater than 50% respiratory variability, suggesting right atrial pressure of 3 mmHg. IAS/Shunts: No atrial level shunt detected by color flow Doppler.  LEFT VENTRICLE PLAX 2D LVIDd:         4.80 cm   Diastology LVIDs:         3.10 cm   LV e' medial:    5.98 cm/s LV PW:         1.30 cm   LV E/e' medial:  18.4 LV IVS:        1.70 cm   LV e' lateral:   7.72 cm/s LVOT diam:     2.20 cm   LV E/e' lateral: 14.2 LV SV:         52 LV SV Index:   25 LVOT Area:     3.80 cm  RIGHT VENTRICLE RV Basal diam:  4.30 cm RV Mid diam:    3.80 cm RV S prime:     11.40 cm/s TAPSE (M-mode): 3.0 cm LEFT ATRIUM            Index        RIGHT ATRIUM           Index LA diam:      4.60 cm  2.20 cm/m   RA Area:     15.90 cm LA Vol (A2C): 68.5 ml  32.83 ml/m  RA Volume:   36.50 ml  17.49 ml/m LA Vol (A4C): 104.0 ml 49.84 ml/m  AORTIC VALVE AV Area (Vmax):    2.09 cm AV Area (Vmean):   2.26 cm AV Area (VTI):     2.08 cm AV Vmax:           119.00 cm/s AV Vmean:          81.100 cm/s AV VTI:            0.248 m AV Peak Grad:      5.7 mmHg AV Mean Grad:      3.0 mmHg LVOT Vmax:         65.50 cm/s LVOT Vmean:        48.200 cm/s LVOT VTI:          0.136 m LVOT/AV VTI ratio: 0.55  AORTA Ao Root diam: 2.90 cm MITRAL VALVE                 TRICUSPID VALVE MV Area (PHT): 4.74 cm     TR Peak grad:   7.4 mmHg MV Decel Time: 160 msec     TR Vmax:        136.00 cm/s MV E velocity: 110.00 cm/s MV A velocity: 57.60 cm/s   SHUNTS MV E/A ratio:  1.91         Systemic VTI:  0.14 m                             Systemic Diam: 2.20 cm Julien Nordmann MD Electronically signed by Julien Nordmann MD Signature Date/Time: 08/08/2022/3:28:50 PM    Final     No results found.  No results found.    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, associated with type 2 diabetes mellitus (HCC) 08/04/2018   Hypokalemia 10/19/2016   Chronic combined systolic (congestive) and diastolic (congestive) heart failure (HCC) 10/2016   Hyperlipidemia associated with type 2 diabetes mellitus (HCC) 01/22/2016   CKD (chronic kidney disease), stage IV (HCC) 06/15/2015   CKD (chronic kidney disease), stage III (HCC) 06/15/2015   DM type 2 with diabetic peripheral neuropathy (HCC) 08/27/2013   Diabetic neuropathy (HCC) 08/27/2013   AA (alcohol abuse) 05/18/2012   Osteoarthritis of ankle and foot 05/18/2012   Alcohol abuse 05/18/2012   Bursitis of elbow 01/14/2012   Benign hypertension with CKD (chronic kidney disease) stage IV (HCC) 09/20/2007   Essential hypertension 09/20/2007   1. Daytime sleepiness Patient evaluation suggests high risk of sleep disordered breathing due to daytime sleepiness.   Patient has comorbid cardiovascular risk factors including: hypertension, cardiomyopathy, combined systolic and diastolic heart failure, coronary artery disease.  which could be exacerbated by pathologic sleep-disordered breathing.  Suggest: PSG to assess/treat the patient's sleep disordered breathing. The patient was also counselled on alcohol reduction to optimize sleep health.  2. Hypertension, unspecified type Hypertension Counseling:   The following hypertensive lifestyle modification were recommended and discussed:  1. Limiting alcohol intake to less than 1 oz/day of ethanol:(24 oz of beer or 8 oz of wine or 2 oz of 100-proof whiskey). 2. Take baby ASA 81 mg daily. 3. Importance of regular aerobic exercise and losing weight. 4. Reduce dietary saturated fat and cholesterol intake for overall cardiovascular health. 5. Maintaining adequate dietary potassium, calcium, and magnesium intake. 6. Regular monitoring of the blood pressure. 7. Reduce sodium intake to less than 100 mmol/day (less than 2.3 gm of sodium or less than 6 gm of sodium choride)    3. Chronic combined systolic (congestive) and diastolic (congestive) heart failure (HCC) Follows with cardiology, continue medications as prescribed.      General Counseling: I have discussed the findings of the evaluation and examination with Md.  I have also discussed any further diagnostic evaluation thatmay be needed or ordered today. Fortunato verbalizes understanding of the findings of todays visit. We also reviewed his medications today and discussed drug interactions and side effects including but not limited excessive drowsiness and altered mental states. We also discussed that there is always a risk not just to him but also people around him. he has been encouraged to call the office with any questions or concerns that should arise related to todays visit.  No orders of the defined types were placed in this encounter.       I have  personally obtained a history, evaluated the patient, evaluated pertinent data, formulated the assessment and plan and placed orders.   This patient was seen today by Emmaline Kluver, PA-C in collaboration with Dr. Freda Munro.   Yevonne Pax, MD [facility] Diplomate ABMS Pulmonary and Critical Care Medicine Sleep medicine

## 2022-12-16 ENCOUNTER — Encounter: Payer: Medicaid Other | Admitting: Internal Medicine

## 2022-12-26 ENCOUNTER — Ambulatory Visit: Payer: Medicaid Other | Admitting: Cardiovascular Disease

## 2022-12-30 ENCOUNTER — Other Ambulatory Visit
Admission: RE | Admit: 2022-12-30 | Discharge: 2022-12-30 | Disposition: A | Discharge status: Home / Self Care | Payer: Medicaid Other | Source: Ambulatory Visit | Attending: Internal Medicine | Admitting: Internal Medicine

## 2022-12-30 ENCOUNTER — Encounter: Payer: Self-pay | Admitting: Internal Medicine

## 2022-12-30 ENCOUNTER — Ambulatory Visit (HOSPITAL_BASED_OUTPATIENT_CLINIC_OR_DEPARTMENT_OTHER): Payer: Medicaid Other | Admitting: Internal Medicine

## 2022-12-30 VITALS — BP 126/64 | HR 63 | Wt 212.2 lb

## 2022-12-30 DIAGNOSIS — I5022 Chronic systolic (congestive) heart failure: Secondary | ICD-10-CM | POA: Insufficient documentation

## 2022-12-30 DIAGNOSIS — I10 Essential (primary) hypertension: Secondary | ICD-10-CM | POA: Diagnosis not present

## 2022-12-30 DIAGNOSIS — I2510 Atherosclerotic heart disease of native coronary artery without angina pectoris: Secondary | ICD-10-CM | POA: Diagnosis not present

## 2022-12-30 DIAGNOSIS — R0683 Snoring: Secondary | ICD-10-CM

## 2022-12-30 DIAGNOSIS — N184 Chronic kidney disease, stage 4 (severe): Secondary | ICD-10-CM

## 2022-12-30 LAB — COMPREHENSIVE METABOLIC PANEL
ALT: 17 U/L (ref 0–44)
AST: 20 U/L (ref 15–41)
Albumin: 3.3 g/dL — ABNORMAL LOW (ref 3.5–5.0)
Alkaline Phosphatase: 56 U/L (ref 38–126)
Anion gap: 9 (ref 5–15)
BUN: 53 mg/dL — ABNORMAL HIGH (ref 8–23)
CO2: 24 mmol/L (ref 22–32)
Calcium: 8.8 mg/dL — ABNORMAL LOW (ref 8.9–10.3)
Chloride: 101 mmol/L (ref 98–111)
Creatinine, Ser: 3.08 mg/dL — ABNORMAL HIGH (ref 0.61–1.24)
GFR, Estimated: 22 mL/min — ABNORMAL LOW (ref >60)
Glucose, Bld: 246 mg/dL — ABNORMAL HIGH (ref 70–99)
Potassium: 4.7 mmol/L (ref 3.5–5.1)
Sodium: 134 mmol/L — ABNORMAL LOW (ref 135–145)
Total Bilirubin: 0.8 mg/dL (ref 0.3–1.2)
Total Protein: 6.4 g/dL — ABNORMAL LOW (ref 6.5–8.1)

## 2022-12-30 LAB — BRAIN NATRIURETIC PEPTIDE: B Natriuretic Peptide: 696.2 pg/mL — ABNORMAL HIGH (ref 0.0–100.0)

## 2022-12-30 NOTE — Progress Notes (Signed)
ADVANCED HF CLINIC NOTE  Referring Physician:Muhammad Kirke Corin, MD Primary Care: Smitty Cords, DO Primary Cardiologist: Lorine Bears, MD   HPI:  [name] is a 62 y.o. male with CAD, chronic systolic HF, DM2, HTN, CKD IV, ETOH abuse and PAD. Referred by Dr. Kirke Corin for further evaluation of his HF and possible cardiac amyloidosis.   He is s/p drug-eluting stent placement to the LAD in 4/818 in setting of acute HF.  At that time, he was found to have severe LV dysfunction with an EF 25-30% which was felt to be out of proportion to the LAD disease.  He also has a history of alcohol abuse.   F/u echo 2018 EF 40-45%.    Echo 2/22 EF 45-50%.  There was concern about possible cardiac amyloidosis.  PYP scan was equivocal for amyloidosis   He was prescribed Farxiga by nephrology but was not covered.  Jardiance had to be stopped due to a rash.  On disability.   I saw him for first time 12/23. Lasix restarted in setting of ReDS 41%. Sleep study ordered. Doxazosin added for HTN. SCr 3.11. Referred to Nephrology.   Here for f/u. Says he is feeling much better. More energy. Working his garden. Sleep study pending for 7/8. No CP, edema, orthopnea or PND.   Echo 08/08/22 EF 55-60%   PFTs 2/24: FEV1 2.44 (71%) FVC 3.62 (79%) Ratio 67% DLCO 60% c/w mild obstruction   Past Medical History:  Diagnosis Date   Ankle pain    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 10/2016 Echo: EF 25-30%, diff HK; b. 01/2017 Echo:  EF 40-45%, GrI DD; c. 08/2017 Echo: EF 40-45%, GrI DD; d. 08/2020 Echo: EF 45-50%, glob HK. Mod asymm LVH, GrI DD, Nl RV size/fxn; e. 08/2020 PYP: equivocal for ATTR cardiac amyloid.   CKD (chronic kidney disease), stage III - IV (HCC)    Coronary artery disease    a. 10/2016 Cath/PCI: LAD 74m (3.5x15 Xience Alpine DES). No other obstructive disease.   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 10/2016 Echo: EF 25-30%, diff HK; b. 01/2017 Echo:  EF 40-45%, GrI DD; c. 08/2017 Echo: EF 40-45%, GrI DD; d. 08/2020 Echo: EF 45-50%, glob HK. Mod asymm LVH, GrI DD, Nl RV size/fxn.   Pain in both feet     Current Outpatient Medications  Medication Sig Dispense Refill   ACCU-CHEK FASTCLIX LANCETS MISC Check sugar up to 3 x daily as instructed 102 each 12   ACCU-CHEK GUIDE test strip Check blood sugar up to 3 times daily as advised 100 each 12   acetaminophen (TYLENOL 8 HOUR) 650 MG CR tablet Take 1 tablet (650 mg total) by mouth every 8 (eight) hours as needed for pain.     aspirin EC 81 MG tablet Take 81 mg by mouth.      atorvastatin (LIPITOR) 40 MG tablet TAKE 1 TABLET BY MOUTH EVERY DAY 90 tablet 1   blood glucose meter kit and supplies KIT Dispense brand based on patient and insurance preference. Use up to 3 times daily to check blood sugar. DX: ICD 10 E11.9 Diabetes Type 2. 1 each 0   carvedilol (COREG) 25 MG tablet TAKE 1 TABLET (25 MG TOTAL) BY MOUTH TWICE A DAY WITH MEALS 180 tablet 1   doxazosin (CARDURA) 1 MG tablet  Take 2 tablets (2 mg total) by mouth at bedtime. 60 tablet 3   furosemide (LASIX) 40 MG tablet Take 1 tablet (40 mg total) by mouth daily. 90 tablet 1   gabapentin (NEURONTIN) 400 MG capsule Take 3 capsules (1,200 mg total) by mouth 3 (three) times daily. 270 capsule 11   hydrALAZINE (APRESOLINE) 100 MG tablet TAKE 1 TABLET BY MOUTH THREE TIMES A DAY 270 tablet 1   Insulin Pen Needle (B-D ULTRAFINE III SHORT PEN) 31G X 8 MM MISC USE TO INJECT INSULIN NIGHTLY 100 each 0   isosorbide mononitrate (IMDUR) 60 MG 24 hr tablet Take 1 tablet (60 mg total) by mouth 2 (two) times daily. 180 tablet 1   Lancets Misc. MISC Use  Brand compatable to insurance and monitor to check blood sugar up to 3 times daily. ICD10 E11.9 100 each 12   LANTUS SOLOSTAR 100 UNIT/ML Solostar Pen INJECT 40 UNITS INTO THE SKIN AT BEDTIME. 15 mL 1    Blood Pressure Monitoring (BLOOD PRESSURE CUFF) MISC 1 each by Does not apply route daily. (Patient not taking: Reported on 12/30/2022) 1 each 0   No current facility-administered medications for this visit.    Allergies  Allergen Reactions   Sulfamethoxazole-Trimethoprim Other (See Comments)    AKI    Amlodipine Other (See Comments)    Edema   Lisinopril     Hyperkalemia    Jardiance [Empagliflozin] Rash   Terbinafine And Related Rash      Social History   Socioeconomic History   Marital status: Married    Spouse name: Not on file   Number of children: Not on file   Years of education: Not on file   Highest education level: Not on file  Occupational History   Not on file  Tobacco Use   Smoking status: Never   Smokeless tobacco: Never  Vaping Use   Vaping Use: Never used  Substance and Sexual Activity   Alcohol use: Yes    Comment: occassionally   Drug use: No   Sexual activity: Not on file  Other Topics Concern   Not on file  Social History Narrative   Not on file   Social Determinants of Health   Financial Resource Strain: Not on file  Food Insecurity: Not on file  Transportation Needs: Not on file  Physical Activity: Not on file  Stress: Not on file  Social Connections: Not on file  Intimate Partner Violence: Not on file      Family History  Problem Relation Age of Onset   Hypertension Mother    Heart disease Maternal Grandfather     Vitals:   12/30/22 1047  BP: 126/64  Pulse: 63  SpO2: 97%  Weight: 212 lb 3.2 oz (96.3 kg)    Wt Readings from Last 3 Encounters:  12/30/22 212 lb 3.2 oz (96.3 kg)  12/02/22 209 lb (94.8 kg)  08/12/22 210 lb (95.3 kg)     PHYSICAL EXAM: General:  Well appearing. No resp difficulty HEENT: normal Neck: supple. no JVD. Carotids 2+ bilat; no bruits. No lymphadenopathy or thryomegaly appreciated. Cor: PMI nondisplaced. Regular rate & rhythm. No rubs, gallops or murmurs. Lungs: clear but decreased  throughout Abdomen: soft, nontender, nondistended. No hepatosplenomegaly. No bruits or masses. Good bowel sounds. Extremities: no cyanosis, clubbing, rash, edema Neuro: alert & orientedx3, cranial nerves grossly intact. moves all 4 extremities w/o difficulty. Affect pleasant  ASSESSMENT & PLAN:  1.  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 Mod LVH - Echo 08/08/22 EF 55-60%  - PYP 2/22 reviewed. H/CL 1.17 Negative for amyloid - Stable NYHA III - Volume status looks very good. Continue lasix 40 daily - Not on Entresto or spironolactone due to CKD IV. - Unable to afford [place] and Jardiance was associated with generalized rash.  - Continue hydralazine 100 tid and imdur 60 daily - I reviewed echo and PYP images personally. I doubt this is amyloid suspect LVH due to HTN.  - Suspect lung disease contributing to his dyspnea. PFTs as below - Labs today  2.  Coronary artery disease:  - Status post LAD stenting in April 2018.  - No s/s angina - not candidate for repeat cath with CKD IV - managed by Dr. Kirke Corin - continue ASA/statin  - reconsider SCLT2i if he can afford    3.  Essential hypertension:  - Blood pressure well controlled. Continue current regimen.  4. DM2: - per PCP   5.  Stage IV chronic kidney disease: - CKD IV baseline Scr 3.1-3.3 - Ideally would try to get back on Farxiga but need to make sure Scr stabilized first - f/u Nephrology - Check labs today  6. COPD - PFTs 2/24: FEV1 2.44 (71%) FVC 3.62 (79%) Ratio 67% DLCO 60% c/w mild obstruction  7. Snoring - sleep study pending for 7/8  Arvilla Meres, MD  11:04 AM

## 2022-12-30 NOTE — Patient Instructions (Signed)
Medication Changes:  No medication changes.  Lab Work:  Labs done today, your results will be available in MyChart, we will contact you for abnormal readings.   Special Instructions // Education:  Do the following things EVERYDAY: Weigh yourself in the morning before breakfast. Write it down and keep it in a log. Take your medicines as prescribed Eat low salt foods--Limit salt (sodium) to 2000 mg per day.  Stay as active as you can everyday Limit all fluids for the day to less than 2 liters   Follow-Up in: please call in November to schedule your 6 months appointment with Dr. Gala Romney.    If you have any questions or concerns before your next appointment please send [place] a message through [place] or call our office at [phone] Monday-Friday 8 am-5 pm.   If you have an urgent need after hours on the weekend please call your Primary Cardiologist or the [facility] in [place] at [phone].

## 2022-12-30 NOTE — Addendum Note (Signed)
Addended by: Electa Sniff on: 12/30/2022 11:30 AM   Modules accepted: Orders

## 2023-01-10 ENCOUNTER — Other Ambulatory Visit: Payer: Self-pay | Admitting: Family Medicine

## 2023-01-10 DIAGNOSIS — E1142 Type 2 diabetes mellitus with diabetic polyneuropathy: Secondary | ICD-10-CM

## 2023-01-13 ENCOUNTER — Other Ambulatory Visit: Payer: Self-pay | Admitting: Family Medicine

## 2023-01-13 DIAGNOSIS — E1142 Type 2 diabetes mellitus with diabetic polyneuropathy: Secondary | ICD-10-CM

## 2023-01-13 NOTE — Telephone Encounter (Signed)
Requested by interface surescripts. Future visit in 2 weeks.  Requested Prescriptions  Pending Prescriptions Disp Refills   LANTUS SOLOSTAR 100 UNIT/ML Solostar Pen [Pharmacy Med Name: LANTUS SOLOSTAR 100 UNIT/ML] 15 mL 0    Sig: INJECT 40 UNITS INTO THE SKIN AT BEDTIME.     Endocrinology:  Diabetes - Insulins Passed - 01/10/2023 11:23 AM      Passed - HBA1C is between 0 and 7.9 and within 180 days    Hemoglobin A1C  Date Value Ref Range Status  07/24/2022 7.1 (A) 4.0 - 5.6 % Final  04/01/2019 7.7  Final    Comment:    [facility]   Hgb A1c MFr Bld  Date Value Ref Range Status  01/21/2022 7.9 (H) <5.7 % of total Hgb Final    Comment:    For someone without known diabetes, a hemoglobin A1c value of 6.5% or greater indicates that they may have  diabetes and this should be confirmed with a follow-up  test. . For someone with known diabetes, a value <7% indicates  that their diabetes is well controlled and a value  greater than or equal to 7% indicates suboptimal  control. A1c targets should be individualized based on  duration of diabetes, age, comorbid conditions, and  other considerations. . Currently, no consensus exists regarding use of hemoglobin A1c for diagnosis of diabetes for children. Verna Czech - Valid encounter within last 6 months    Recent Outpatient Visits           5 months ago DM type 2 with diabetic peripheral neuropathy [facility])   [facility] Smitty Cords, DO   11 months ago Annual physical exam   [place] [facility] Smitty Cords, DO   1 year ago DM type 2 with diabetic peripheral neuropathy [facility])   [place] [facility] Smitty Cords, DO   1 year ago DM type 2 with diabetic peripheral neuropathy [facility])   [place] [facility], Netta Neat, DO   2 years ago DM type 2 with diabetic peripheral neuropathy [facility])    [place] [facility] Althea Charon, Netta Neat, DO       Future Appointments             In 2 weeks Althea Charon, Netta Neat, DO [facility], [facility]   In 1 month Kirke Corin, Chelsea Aus, MD [facility]

## 2023-01-14 NOTE — Telephone Encounter (Signed)
Requested Prescriptions  Pending Prescriptions Disp Refills   B-D ULTRAFINE III SHORT PEN 31G X 8 MM MISC [Pharmacy Med Name: BD UF SHORT PEN NEEDLE [id number]] 100 each 0    Sig: USE TO INJECT INSULIN NIGHTLY     Endocrinology: Diabetes - Testing Supplies Passed - 01/13/2023  7:09 PM      Passed - Valid encounter within last 12 months    Recent Outpatient Visits           5 months ago DM type 2 with diabetic peripheral neuropathy [facility])   [place] [facility] Smitty Cords, DO   11 months ago Annual physical exam   [place] [facility] Smitty Cords, DO   1 year ago DM type 2 with diabetic peripheral neuropathy [facility])   [place] [facility] Smitty Cords, DO   1 year ago DM type 2 with diabetic peripheral neuropathy [facility])   [place] [facility] Tensed, Netta Neat, DO   2 years ago DM type 2 with diabetic peripheral neuropathy [facility])   [place] [facility], Netta Neat, DO       Future Appointments             In 2 weeks Althea Charon, Netta Neat, DO  [facility], [facility]   In 1 month Kirke Corin, Chelsea Aus, MD [facility]

## 2023-01-15 DIAGNOSIS — D631 Anemia in chronic kidney disease: Secondary | ICD-10-CM | POA: Diagnosis not present

## 2023-01-15 DIAGNOSIS — I5022 Chronic systolic (congestive) heart failure: Secondary | ICD-10-CM | POA: Diagnosis not present

## 2023-01-15 DIAGNOSIS — R809 Proteinuria, unspecified: Secondary | ICD-10-CM | POA: Diagnosis not present

## 2023-01-15 DIAGNOSIS — E1122 Type 2 diabetes mellitus with diabetic chronic kidney disease: Secondary | ICD-10-CM | POA: Diagnosis not present

## 2023-01-15 DIAGNOSIS — N184 Chronic kidney disease, stage 4 (severe): Secondary | ICD-10-CM | POA: Diagnosis not present

## 2023-01-15 DIAGNOSIS — I10 Essential (primary) hypertension: Secondary | ICD-10-CM | POA: Diagnosis not present

## 2023-01-20 DIAGNOSIS — G4733 Obstructive sleep apnea (adult) (pediatric): Secondary | ICD-10-CM | POA: Diagnosis not present

## 2023-01-22 ENCOUNTER — Other Ambulatory Visit: Payer: Medicaid Other

## 2023-01-22 DIAGNOSIS — E1169 Type 2 diabetes mellitus with other specified complication: Secondary | ICD-10-CM

## 2023-01-22 DIAGNOSIS — Z0000 Encounter for general adult medical examination without abnormal findings: Secondary | ICD-10-CM

## 2023-01-22 DIAGNOSIS — E1142 Type 2 diabetes mellitus with diabetic polyneuropathy: Secondary | ICD-10-CM

## 2023-01-22 DIAGNOSIS — I129 Hypertensive chronic kidney disease with stage 1 through stage 4 chronic kidney disease, or unspecified chronic kidney disease: Secondary | ICD-10-CM

## 2023-01-22 DIAGNOSIS — I5042 Chronic combined systolic (congestive) and diastolic (congestive) heart failure: Secondary | ICD-10-CM

## 2023-01-22 DIAGNOSIS — R351 Nocturia: Secondary | ICD-10-CM

## 2023-01-28 ENCOUNTER — Telehealth: Payer: Self-pay

## 2023-01-28 ENCOUNTER — Encounter: Payer: Self-pay | Admitting: Internal Medicine

## 2023-01-28 DIAGNOSIS — I5022 Chronic systolic (congestive) heart failure: Secondary | ICD-10-CM

## 2023-01-28 NOTE — Telephone Encounter (Signed)
Received results of portable polysomnogram from [facility]. Reviewed by Dr. Gala Romney.  Dr. Gala Romney requests that patient be referred to Dr. Jayme Cloud at [facility] for Sleep Apnea treatment. Referral placed and patient notified. Pt aware, agreeable, and verbalized understanding

## 2023-01-29 ENCOUNTER — Other Ambulatory Visit: Payer: Self-pay | Admitting: Family Medicine

## 2023-01-29 ENCOUNTER — Ambulatory Visit (INDEPENDENT_AMBULATORY_CARE_PROVIDER_SITE_OTHER): Payer: Medicaid Other | Admitting: Family Medicine

## 2023-01-29 ENCOUNTER — Encounter: Payer: Self-pay | Admitting: Family Medicine

## 2023-01-29 ENCOUNTER — Telehealth: Payer: Self-pay | Admitting: Family Medicine

## 2023-01-29 VITALS — BP 120/68 | HR 56 | Ht 68.0 in | Wt 214.0 lb

## 2023-01-29 DIAGNOSIS — Z0000 Encounter for general adult medical examination without abnormal findings: Secondary | ICD-10-CM | POA: Diagnosis not present

## 2023-01-29 DIAGNOSIS — I5042 Chronic combined systolic (congestive) and diastolic (congestive) heart failure: Secondary | ICD-10-CM | POA: Diagnosis not present

## 2023-01-29 DIAGNOSIS — N184 Chronic kidney disease, stage 4 (severe): Secondary | ICD-10-CM | POA: Diagnosis not present

## 2023-01-29 DIAGNOSIS — E785 Hyperlipidemia, unspecified: Secondary | ICD-10-CM

## 2023-01-29 DIAGNOSIS — I129 Hypertensive chronic kidney disease with stage 1 through stage 4 chronic kidney disease, or unspecified chronic kidney disease: Secondary | ICD-10-CM | POA: Diagnosis not present

## 2023-01-29 DIAGNOSIS — E1142 Type 2 diabetes mellitus with diabetic polyneuropathy: Secondary | ICD-10-CM | POA: Diagnosis not present

## 2023-01-29 DIAGNOSIS — S98131D Complete traumatic amputation of one right lesser toe, subsequent encounter: Secondary | ICD-10-CM

## 2023-01-29 DIAGNOSIS — R351 Nocturia: Secondary | ICD-10-CM | POA: Diagnosis not present

## 2023-01-29 DIAGNOSIS — E1169 Type 2 diabetes mellitus with other specified complication: Secondary | ICD-10-CM | POA: Diagnosis not present

## 2023-01-29 MED ORDER — GABAPENTIN 400 MG PO CAPS: 400.0000 mg | ORAL_CAPSULE | Freq: Two times a day (BID) | ORAL | 1 refills | Status: DC

## 2023-01-29 NOTE — Telephone Encounter (Signed)
Please notify patient that we received eye exam report from [facility] and he was last seen 2022 and they sent him to [facility].  They have not seen him since 2022. So he may not be remembering the correct year.  He said he had a new Diabetic Eye Exam done this year there.  Please let him know that if there was another location he went to, we will need to know the information to update his chart. Otherwise, if he actually didn't go anywhere yet this year, he should schedule an eye appointment at [facility] or [place] or we can recommend other locations.  [name], DO [facility] 01/29/2023, 5:27 PM

## 2023-01-29 NOTE — Assessment & Plan Note (Addendum)
Previously Controlled LDL, high TG Calculated ASCVD 10 yr risk score elevated with DM, CAD  Plan: 1. Continue Atorvastatin 40mg  daily 2. Continue Plavix, ASA 81mg  for secondary ASCVD risk reduction 3. Encourage improved lifestyle - low carb/cholesterol, reduce portion size, continue improving regular exercise

## 2023-01-29 NOTE — Assessment & Plan Note (Addendum)
Controlled BP Complication with CKD-IV followed by Nephrology again, CAD   Plan:  1. Continue current BP regimen - Carvedilol 25mg  BID, Hydralazine 100mg  q 8 hr, isosorbide mononitrate imdur 90mg , Losartan 25mg  dailiy, Doxazosin 2. Encourage improved lifestyle - low sodium diet, regular exercise 3. Continue monitor BP outside office, bring readings to next visit, if persistently >140/90 or new symptoms notify office sooner

## 2023-01-29 NOTE — Patient Instructions (Addendum)
Thank you for coming to the office today.  Cologuard positive as we discussed several years ago. I strongly recommend doing the Colonoscopy to screen and rule out colon cancer. Please let me know when ready to proceed with referral.  Labs tomorrow  Refilled Gabapentin  DUE for FASTING BLOOD WORK (no food or drink after midnight before the lab appointment, only water or coffee without cream/sugar on the morning of)  SCHEDULE "Lab Only" visit in the morning at the clinic for lab draw in 1 day  - Make sure Lab Only appointment is at about 1 week before your next appointment, so that results will be available  For Lab Results, once available within 2-3 days of blood draw, you can can log in to MyChart online to view your results and a brief explanation. Also, we can discuss results at next follow-up visit.   Please schedule a Follow-up Appointment to: Return in about 6 months (around 08/01/2023) for 1 day lab only 7/18. 6 month follow-up DM A1c CKD HTN.  If you have any other questions or concerns, please feel free to call the office or send a message through MyChart. You may also schedule an earlier appointment if necessary.  Additionally, you may be receiving a survey about your experience at our office within a few days to 1 week by e-mail or mail. We value your feedback.  [name], DO [facility], [place]

## 2023-01-29 NOTE — Assessment & Plan Note (Signed)
Clinically stable, euvolemic today CHF with history of reduced EF 40-50% Complication with CKD-III-IV Off SGLT2 due to rash / side effect  Plan Continue current med management per Cardiology Limit sodium in diet

## 2023-01-29 NOTE — Progress Notes (Signed)
Subjective:    Patient ID: [name], male    DOB: 04-16-1961, 62 y.o.   MRN: [id number]  [name] is a 62 y.o. 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 Failed Bydureon in past due to nausea Reports good compliance. Tolerating well w/o side-effects On ARB On Atorvastatin 40mg  daily. Last labs reviewed Lifestyle: - Diet (Admits improved DM diet) - Exercise (Limited regular exericse) Diabet Eye Exam done 05/03/21 [facility]  Admits intermittent burning, tingling at times in feet, worse at night Denies hypoglycemia, polyuria, visual changes   Hypertension / CKD-IV CHF, Diastolic, Systolic Combined He has been followed by [facility] Dr Cherylann Ratel , recently returned 01/15/23 Off SGLT2 due to cost and side effect rash on Jardiance He remains off Metformin On Carvedilol 25 BID, Hydralazine, Doxazosin 1mg  Newly resumed Losartan 25mg  daily ARB - recently restarted by Nephrology  OSA Recently established with Dr Norval Morton Pulmonology for sleep study, initial PSG documented sleep apnea, see scanned results. Now they will schedule CPAP Titration.   Muscle cramps Episodic nocturnal muscle cramp.       07/24/2022    8:38 AM 01/21/2022   12:47 PM 01/04/2021    8:11 AM  Depression screen PHQ 2/9  Decreased Interest 1 0 0  Down, Depressed, Hopeless 0 0 0  PHQ - 2 Score 1 0 0  Altered sleeping 2  3  Tired, decreased energy 2  3  Change in appetite 0  0  Feeling bad or failure about yourself  0  0  Trouble concentrating 0  0  Moving slowly or fidgety/restless 0  0  Suicidal thoughts 0  0  PHQ-9 Score 5  6  Difficult doing work/chores Not difficult at all  Not difficult at all    Past Medical History:  Diagnosis Date   Ankle pain    Chronic combined systolic  (congestive) and diastolic (congestive) heart failure (HCC) 10/2016   a. 10/2016 Echo: EF 25-30%, diff HK; b. 01/2017 Echo:  EF 40-45%, GrI DD; c. 08/2017 Echo: EF 40-45%, GrI DD; d. 08/2020 Echo: EF 45-50%, glob HK. Mod asymm LVH, GrI DD, Nl RV size/fxn; e. 08/2020 PYP: equivocal for ATTR cardiac amyloid.   CKD (chronic kidney disease), stage III - IV (HCC)    Coronary artery disease    a. 10/2016 Cath/PCI: LAD 54m (3.5x15 Xience Alpine DES). No other obstructive disease.   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 10/2016 Echo: EF 25-30%, diff HK; b. 01/2017 Echo:  EF 40-45%, GrI DD; c. 08/2017 Echo: EF 40-45%, GrI DD; d. 08/2020 Echo: EF 45-50%, glob HK. Mod asymm LVH, GrI DD, Nl RV size/fxn.   Pain in both feet    Past Surgical History:  Procedure Laterality Date   CARDIAC CATHETERIZATION     CORONARY STENT INTERVENTION N/A 10/21/2016   Procedure: Coronary Stent Intervention;  Surgeon: Iran Ouch, MD;  Location: [facility] INVASIVE CV LAB;  Service: Cardiovascular;  Laterality: N/A;   LEFT HEART CATH AND CORONARY ANGIOGRAPHY N/A 10/21/2016   Procedure: Left Heart Cath and Coronary Angiography;  Surgeon: Iran Ouch, MD;  Location: [facility] INVASIVE CV LAB;  Service: Cardiovascular;  Laterality: N/A;   TIBIA FRACTURE SURGERY Right 2002   Social History   Socioeconomic History   Marital status: Married    Spouse name: Not on file   Number of children: Not on file   Years of education: Not on file   Highest education level: Not on file  Occupational History   Not on file  Tobacco Use   Smoking status: Never   Smokeless tobacco: Never  Vaping Use   Vaping status: Never Used  Substance and Sexual Activity   Alcohol use: Yes    Comment: occassionally   Drug use: No   Sexual activity: Not on file  Other Topics Concern   Not on file  Social History Narrative   Not on file   Social Determinants of Health    Financial Resource Strain: Not on file  Food Insecurity: No Food Insecurity (04/02/2019)   Received from [facility]   Hunger Vital Sign    Worried About Running Out of Food in the Last Year: Never true    Ran Out of Food in the Last Year: Never true  Transportation Needs: Not on file  Physical Activity: Not on file  Stress: Not on file  Social Connections: Not on file  Intimate Partner Violence: Not on file   Family History  Problem Relation Age of Onset   Hypertension Mother    Heart disease Maternal Grandfather    Current Outpatient Medications on File Prior to Visit  Medication Sig   ACCU-CHEK FASTCLIX LANCETS MISC Check sugar up to 3 x daily as instructed   ACCU-CHEK GUIDE test strip Check blood sugar up to 3 times daily as advised   acetaminophen (TYLENOL 8 HOUR) 650 MG CR tablet Take 1 tablet (650 mg total) by mouth every 8 (eight) hours as needed for pain.   aspirin EC 81 MG tablet Take 81 mg by mouth.    atorvastatin (LIPITOR) 40 MG tablet TAKE 1 TABLET BY MOUTH EVERY DAY   B-D ULTRAFINE III SHORT PEN 31G X 8 MM MISC USE TO INJECT INSULIN NIGHTLY   carvedilol (COREG) 25 MG tablet TAKE 1 TABLET (25 MG TOTAL) BY MOUTH TWICE A DAY WITH MEALS   doxazosin (CARDURA) 1 MG tablet Take 2 tablets (2 mg total) by mouth at bedtime.   furosemide (LASIX) 40 MG tablet Take 1 tablet (40 mg total) by mouth daily.   hydrALAZINE (APRESOLINE) 100 MG tablet TAKE 1 TABLET BY MOUTH THREE TIMES A DAY   isosorbide mononitrate (IMDUR) 60 MG 24 hr tablet Take 1 tablet (60 mg total) by mouth 2 (two) times daily.   Lancets Misc. MISC Use  Brand compatable to insurance and monitor to check blood sugar up to 3 times daily. ICD10 E11.9   LANTUS SOLOSTAR 100 UNIT/ML Solostar Pen INJECT 40 UNITS INTO THE SKIN AT BEDTIME.   LOSARTAN POTASSIUM PO Take 25 mg by mouth daily.   No current facility-administered medications on file prior to visit.    Review of Systems  Constitutional:   Negative for activity change, appetite change, chills, diaphoresis, fatigue and fever.  HENT:  Negative for congestion and hearing loss.   Eyes:  Negative for visual disturbance.  Respiratory:  Negative for cough, chest tightness, shortness of breath and wheezing.   Cardiovascular:  Negative for chest pain, palpitations and leg swelling.  Gastrointestinal:  Negative for abdominal pain, constipation, diarrhea, nausea and vomiting.  Genitourinary:  Negative for dysuria, frequency and hematuria.  Musculoskeletal:  Negative for arthralgias and neck pain.  Skin:  Negative for rash.  Neurological:  Negative for dizziness, weakness, light-headedness, numbness and headaches.  Hematological:  Negative for adenopathy.  Psychiatric/Behavioral:  Negative for behavioral problems, dysphoric mood and sleep disturbance.    Per HPI unless specifically indicated above      Objective:    BP 120/68   Pulse (!) 56   Ht 5\' 8"  (1.727 m)   Wt 214 lb (97.1 kg)   SpO2 94%   BMI 32.54 kg/m   Wt Readings from Last 3 Encounters:  01/29/23 214 lb (97.1 kg)  12/30/22 212 lb 3.2 oz (96.3 kg)  12/02/22 209 lb (94.8 kg)    Physical Exam Vitals and nursing note reviewed.  Constitutional:      General: He is not in acute distress.    Appearance: He is well-developed. He is not diaphoretic.     Comments: Well-appearing, comfortable, cooperative  HENT:     Head: Normocephalic and atraumatic.  Eyes:     General:        Right eye: No discharge.        Left eye: No discharge.     Conjunctiva/sclera: Conjunctivae normal.     Pupils: Pupils are equal, round, and reactive to light.  Neck:     Thyroid: No thyromegaly.     Vascular: No carotid bruit.  Cardiovascular:     Rate and Rhythm: Normal rate and regular rhythm.     Pulses: Normal pulses.     Heart sounds: Normal heart sounds. No murmur heard. Pulmonary:     Effort: Pulmonary effort is normal. No respiratory distress.     Breath sounds: Normal breath  sounds. No wheezing or rales.  Abdominal:     General: Bowel sounds are normal. There is no distension.     Palpations: Abdomen is soft. There is no mass.     Tenderness: There is no abdominal tenderness.  Musculoskeletal:        General: No tenderness. Normal range of motion.     Cervical back: Normal range of motion and neck supple.     Comments: Upper / Lower Extremities: - Normal muscle tone, strength bilateral upper extremities 5/5, lower extremities 5/5  Lymphadenopathy:     Cervical: No cervical adenopathy.  Skin:    General: Skin is warm and dry.     Findings: No erythema or rash.  Neurological:     Mental Status: He is alert and oriented to person, place, and time.     Comments: Distal sensation intact to light touch all extremities  Psychiatric:        Mood and Affect: Mood normal.        Behavior: Behavior normal.        Thought Content: Thought content normal.     Comments: Well groomed, good eye contact, normal speech and thoughts     Diabetic Foot Exam - Simple   Simple Foot Form Diabetic Foot exam was performed with the following findings: Yes 01/29/2023  8:44 AM  Visual Inspection See comments: Yes Sensation Testing See comments: Yes Pulse Check Posterior Tibialis and Dorsalis pulse intact bilaterally: Yes Comments Right foot s/p toe amputation great toe and 2nd toe, well healed. Reduced monofilament sensation plantar foot forefoot toes. Left foot with reduced monofilament plantar. Dorsal sensation intact to monofilament. No ulceration. Some mild callus formation forefoot and heel.      Results for orders placed or performed during the hospital encounter of 12/30/22  Comprehensive metabolic panel  Result Value Ref Range   Sodium 134 (L) 135 - 145 mmol/L   Potassium 4.7 3.5 - 5.1 mmol/L   Chloride 101 98 - 111 mmol/L   CO2 24 22 - 32 mmol/L   Glucose, Bld 246 (H) 70 - 99 mg/dL   BUN 53 (H) 8 - 23 mg/dL   Creatinine, Ser 1.61 (H) 0.61 - 1.24 mg/dL    Calcium 8.8 (L) 8.9 - 10.3 mg/dL   Total Protein 6.4 (L) 6.5 - 8.1 g/dL   Albumin 3.3 (L) 3.5 - 5.0 g/dL   AST 20 15 - 41 U/L   ALT 17 0 - 44 U/L   Alkaline Phosphatase 56 38 - 126 U/L   Total Bilirubin 0.8 0.3 - 1.2 mg/dL   GFR, Estimated 22 (L) >60 mL/min   Anion gap 9 5 - 15  Brain natriuretic peptide  Result Value Ref Range   B Natriuretic Peptide 696.2 (H) 0.0 - 100.0 pg/mL      Assessment & Plan:   Problem List Items Addressed This Visit     Amputation of little toe (HCC)   Benign hypertension with CKD (chronic kidney disease) stage IV (HCC)    Controlled BP Complication with CKD-IV followed by Nephrology again, CAD   Plan:  1. Continue current BP regimen - Carvedilol 25mg  BID, Hydralazine 100mg  q 8 hr, isosorbide mononitrate imdur 90mg , Losartan 25mg  dailiy, Doxazosin 2. Encourage improved lifestyle - low sodium diet, regular exercise 3. Continue monitor BP outside office, bring readings to next visit, if persistently >140/90 or new symptoms notify office sooner      Relevant Medications   LOSARTAN POTASSIUM PO   Chronic combined systolic (congestive) and diastolic (congestive) heart failure (HCC)    Clinically stable, euvolemic today CHF with history of reduced EF 40-50% Complication with CKD-III-IV Off SGLT2 due to rash / side effect  Plan Continue current med management per Cardiology Limit sodium in diet      Relevant Medications   LOSARTAN POTASSIUM PO   DM type 2 with diabetic peripheral neuropathy (HCC)   Relevant Medications   LOSARTAN POTASSIUM PO   gabapentin (NEURONTIN) 400 MG capsule   Hyperlipidemia associated with type 2 diabetes mellitus (HCC)    Previously Controlled LDL, high TG Calculated ASCVD 10 yr risk score elevated with DM, CAD  Plan: 1. Continue Atorvastatin 40mg  daily 2. Continue Plavix, ASA 81mg  for secondary ASCVD risk reduction 3. Encourage improved lifestyle - low carb/cholesterol, reduce portion size, continue improving  regular exercise      Relevant Medications   LOSARTAN POTASSIUM PO   Other Visit Diagnoses     Annual physical exam    -  Primary   Nocturia          Updated Health Maintenance information Fasting lab tomorrow 01/30/23 at 9am pending results. Encouraged improvement to lifestyle with diet and exercise Goal of weight loss    Meds ordered this encounter  Medications   gabapentin (NEURONTIN) 400 MG capsule    Sig: Take 1 capsule (400 mg total) by mouth 2 (two) times daily.    Dispense:  360 capsule    Refill:  1      Follow up plan: Return in about 6 months (around 08/01/2023) for 1 day lab only 7/18. 6 month follow-up DM A1c CKD HTN.  Labs already active / released from 7/9. He will return 7/18  [name], DO [facility] 01/29/2023, 8:35 AM

## 2023-01-30 ENCOUNTER — Other Ambulatory Visit: Payer: Medicaid Other

## 2023-01-30 DIAGNOSIS — R351 Nocturia: Secondary | ICD-10-CM | POA: Diagnosis not present

## 2023-01-30 DIAGNOSIS — I5042 Chronic combined systolic (congestive) and diastolic (congestive) heart failure: Secondary | ICD-10-CM | POA: Diagnosis not present

## 2023-01-30 DIAGNOSIS — E785 Hyperlipidemia, unspecified: Secondary | ICD-10-CM | POA: Diagnosis not present

## 2023-01-30 DIAGNOSIS — E1169 Type 2 diabetes mellitus with other specified complication: Secondary | ICD-10-CM | POA: Diagnosis not present

## 2023-01-30 DIAGNOSIS — I129 Hypertensive chronic kidney disease with stage 1 through stage 4 chronic kidney disease, or unspecified chronic kidney disease: Secondary | ICD-10-CM | POA: Diagnosis not present

## 2023-01-30 DIAGNOSIS — E1142 Type 2 diabetes mellitus with diabetic polyneuropathy: Secondary | ICD-10-CM | POA: Diagnosis not present

## 2023-01-30 DIAGNOSIS — N184 Chronic kidney disease, stage 4 (severe): Secondary | ICD-10-CM | POA: Diagnosis not present

## 2023-01-30 DIAGNOSIS — Z0000 Encounter for general adult medical examination without abnormal findings: Secondary | ICD-10-CM | POA: Diagnosis not present

## 2023-01-30 NOTE — Telephone Encounter (Signed)
Left message for patient to call and schedule an eye exam

## 2023-01-31 LAB — COMPLETE METABOLIC PANEL WITH GFR
AG Ratio: 1.4 (calc) (ref 1.0–2.5)
ALT: 13 U/L (ref 9–46)
AST: 13 U/L (ref 10–35)
Albumin: 3.6 g/dL (ref 3.6–5.1)
Alkaline phosphatase (APISO): 56 U/L (ref 35–144)
BUN/Creatinine Ratio: 14 (calc) (ref 6–22)
BUN: 39 mg/dL — ABNORMAL HIGH (ref 7–25)
CO2: 28 mmol/L (ref 20–32)
Calcium: 8.9 mg/dL (ref 8.6–10.3)
Chloride: 100 mmol/L (ref 98–110)
Creat: 2.77 mg/dL — ABNORMAL HIGH (ref 0.70–1.35)
Globulin: 2.5 g/dL (calc) (ref 1.9–3.7)
Glucose, Bld: 184 mg/dL — ABNORMAL HIGH (ref 65–139)
Potassium: 4.5 mmol/L (ref 3.5–5.3)
Sodium: 136 mmol/L (ref 135–146)
Total Bilirubin: 0.6 mg/dL (ref 0.2–1.2)
Total Protein: 6.1 g/dL (ref 6.1–8.1)
eGFR: 25 mL/min/{1.73_m2} — ABNORMAL LOW (ref >=60)

## 2023-01-31 LAB — LIPID PANEL
Cholesterol: 132 mg/dL (ref <200)
HDL: 38 mg/dL — ABNORMAL LOW (ref >=40)
LDL Cholesterol (Calc): 71 mg/dL (calc)
Non-HDL Cholesterol (Calc): 94 mg/dL (calc) (ref <130)
Total CHOL/HDL Ratio: 3.5 (calc) (ref <5.0)
Triglycerides: 143 mg/dL (ref <150)

## 2023-01-31 LAB — CBC WITH DIFFERENTIAL/PLATELET
Absolute Monocytes: 610 cells/uL (ref 200–950)
Basophils Absolute: 28 cells/uL (ref 0–200)
Basophils Relative: 0.5 %
Eosinophils Absolute: 129 cells/uL (ref 15–500)
Eosinophils Relative: 2.3 %
HCT: 35.9 % — ABNORMAL LOW (ref 38.5–50.0)
Hemoglobin: 12 g/dL — ABNORMAL LOW (ref 13.2–17.1)
Lymphs Abs: 851 cells/uL (ref 850–3900)
MCH: 33.7 pg — ABNORMAL HIGH (ref 27.0–33.0)
MCHC: 33.4 g/dL (ref 32.0–36.0)
MCV: 100.8 fL — ABNORMAL HIGH (ref 80.0–100.0)
MPV: 10.1 fL (ref 7.5–12.5)
Monocytes Relative: 10.9 %
Neutro Abs: 3982 cells/uL (ref 1500–7800)
Neutrophils Relative %: 71.1 %
Platelets: 178 10*3/uL (ref 140–400)
RBC: 3.56 10*6/uL — ABNORMAL LOW (ref 4.20–5.80)
RDW: 12 % (ref 11.0–15.0)
Total Lymphocyte: 15.2 %
WBC: 5.6 10*3/uL (ref 3.8–10.8)

## 2023-01-31 LAB — HEMOGLOBIN A1C
Hgb A1c MFr Bld: 8.6 % of total Hgb — ABNORMAL HIGH (ref <5.7)
Mean Plasma Glucose: 200 mg/dL
eAG (mmol/L): 11.1 mmol/L

## 2023-01-31 LAB — TSH: TSH: 1.57 mIU/L (ref 0.40–4.50)

## 2023-01-31 LAB — PSA: PSA: 0.64 ng/mL (ref <=4.00)

## 2023-02-04 ENCOUNTER — Other Ambulatory Visit: Payer: Self-pay | Admitting: Family Medicine

## 2023-02-04 DIAGNOSIS — E1142 Type 2 diabetes mellitus with diabetic polyneuropathy: Secondary | ICD-10-CM

## 2023-02-05 NOTE — Telephone Encounter (Signed)
Requested medication (s) are due for refill today: na  Requested medication (s) are on the active medication list: yes  Last refill:  01/13/23 #15ml 0 refills  Future visit scheduled: no  Notes to clinic:  Pharmacy comment: REQUEST FOR 90 DAYS PRESCRIPTION. DX Code Needed.   Please advise.    Requested Prescriptions  Pending Prescriptions Disp Refills   LANTUS SOLOSTAR 100 UNIT/ML Solostar Pen [Pharmacy Med Name: LANTUS SOLOSTAR 100 UNIT/ML]  1    Sig: INJECT 40 UNITS INTO THE SKIN AT BEDTIME.     Endocrinology:  Diabetes - Insulins Failed - 02/04/2023 10:30 AM      Failed - HBA1C is between 0 and 7.9 and within 180 days    Hemoglobin A1C  Date Value Ref Range Status  04/01/2019 7.7  Final    Comment:    [facility]   Hgb A1c MFr Bld  Date Value Ref Range Status  01/30/2023 8.6 (H) <5.7 % of total Hgb Final    Comment:    For someone without known diabetes, a hemoglobin A1c value of 6.5% or greater indicates that they may have  diabetes and this should be confirmed with a follow-up  test. . For someone with known diabetes, a value <7% indicates  that their diabetes is well controlled and a value  greater than or equal to 7% indicates suboptimal  control. A1c targets should be individualized based on  duration of diabetes, age, comorbid conditions, and  other considerations. . Currently, no consensus exists regarding use of hemoglobin A1c for diagnosis of diabetes for children. Verna Czech - Valid encounter within last 6 months    Recent Outpatient Visits           1 week ago Annual physical exam   [place] [facility] Cornelius, Netta Neat, DO   6 months ago DM type 2 with diabetic peripheral neuropathy [facility])   [place] [facility] Smitty Cords, DO   1 year ago Annual physical exam   [place] [facility] Smitty Cords, DO   1 year ago DM type 2 with diabetic peripheral  neuropathy [facility])   [place] [facility] Smitty Cords, DO   1 year ago DM type 2 with diabetic peripheral neuropathy [facility])   [place] [facility] Smitty Cords, DO       Future Appointments             In 1 month Kirke Corin, Chelsea Aus, MD [facility]

## 2023-02-09 ENCOUNTER — Other Ambulatory Visit: Payer: Self-pay | Admitting: Cardiovascular Disease

## 2023-02-09 DIAGNOSIS — E1169 Type 2 diabetes mellitus with other specified complication: Secondary | ICD-10-CM

## 2023-02-17 ENCOUNTER — Ambulatory Visit
Admission: RE | Admit: 2023-02-17 | Discharge: 2023-02-17 | Disposition: A | Discharge status: Home / Self Care | Payer: Medicaid Other | Source: Ambulatory Visit | Attending: Emergency Medicine | Admitting: Emergency Medicine

## 2023-02-17 ENCOUNTER — Ambulatory Visit: Payer: Self-pay

## 2023-02-17 VITALS — BP 144/70 | HR 60 | Temp 98.5°F | Resp 16

## 2023-02-17 DIAGNOSIS — E11621 Type 2 diabetes mellitus with foot ulcer: Secondary | ICD-10-CM | POA: Diagnosis not present

## 2023-02-17 DIAGNOSIS — L97511 Non-pressure chronic ulcer of other part of right foot limited to breakdown of skin: Secondary | ICD-10-CM | POA: Diagnosis not present

## 2023-02-17 MED ORDER — DOXYCYCLINE HYCLATE 100 MG PO CAPS: 100.0000 mg | ORAL_CAPSULE | Freq: Two times a day (BID) | ORAL | 0 refills | Status: DC

## 2023-02-17 NOTE — ED Provider Notes (Signed)
[facility]    CSN: [id number] Arrival date & time: 02/17/23  1757      History   Chief Complaint Chief Complaint  Patient presents with   Foot Problem    HPI [name] is a 62 y.o. male.   HPI  62 year old male with a past medical history significant for type 2 diabetes, chronic kidney disease, diabetic neuropathy, nonproliferative diabetic retinopathy, mixed ischemic and nonischemic cardiomyopathy, CAD, and hyperlipidemia presents for evaluation of a wound on the bottom of his right foot along with swelling that is been present for the last 3 to 4 days.  He typically wears special shoes but his wife reports that he got a new pair of shoes and she believes that is what may have precipitated this foot wound.  Past Medical History:  Diagnosis Date   Ankle pain    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 10/2016 Echo: EF 25-30%, diff HK; b. 01/2017 Echo:  EF 40-45%, GrI DD; c. 08/2017 Echo: EF 40-45%, GrI DD; d. 08/2020 Echo: EF 45-50%, glob HK. Mod asymm LVH, GrI DD, Nl RV size/fxn; e. 08/2020 PYP: equivocal for ATTR cardiac amyloid.   CKD (chronic kidney disease), stage III - IV (HCC)    Coronary artery disease    a. 10/2016 Cath/PCI: LAD 67m (3.5x15 Xience Alpine DES). No other obstructive disease.   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 10/2016 Echo: EF 25-30%, diff HK; b. 01/2017 Echo:  EF 40-45%, GrI DD; c. 08/2017 Echo: EF 40-45%, GrI DD; d. 08/2020 Echo: EF 45-50%, glob HK. Mod asymm LVH, GrI DD, Nl RV size/fxn.   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, associated with type 2 diabetes mellitus (HCC) 08/04/2018   Hypokalemia 10/19/2016   Chronic combined systolic (congestive) and diastolic (congestive) heart failure (HCC) 10/2016   Hyperlipidemia associated with type 2 diabetes mellitus (HCC) 01/22/2016   CKD (chronic kidney disease), stage IV (HCC) 06/15/2015   CKD (chronic kidney disease), stage III (HCC) 06/15/2015   DM type 2 with diabetic peripheral neuropathy (HCC) 08/27/2013   Diabetic neuropathy (HCC) 08/27/2013   AA (alcohol abuse) 05/18/2012   Osteoarthritis of ankle and foot 05/18/2012   Alcohol abuse 05/18/2012   Bursitis of elbow 01/14/2012   Benign hypertension with CKD (chronic kidney disease) stage IV (HCC) 09/20/2007   Essential hypertension 09/20/2007    Past Surgical History:  Procedure Laterality Date   CARDIAC CATHETERIZATION     CORONARY STENT INTERVENTION N/A 10/21/2016   Procedure: Coronary Stent Intervention;  Surgeon: Iran Ouch, MD;  Location: [facility];  Service: Cardiovascular;  Laterality: N/A;   LEFT HEART CATH AND CORONARY ANGIOGRAPHY N/A 10/21/2016   Procedure: Left Heart Cath and Coronary Angiography;  Surgeon: Iran Ouch, MD;  Location:  [facility];  Service: Cardiovascular;  Laterality: N/A;   TIBIA FRACTURE SURGERY Right 2002       Home Medications    Prior to Admission medications   Medication Sig Start Date End Date Taking? Authorizing Provider  aspirin EC 81 MG tablet Take 81 mg by mouth.  09/22/07  Yes [provider]  atorvastatin (LIPITOR) 40 MG tablet TAKE 1 TABLET BY MOUTH EVERY DAY 02/10/23  Yes Iran Ouch, MD  carvedilol (COREG) 25 MG  tablet TAKE 1 TABLET (25 MG TOTAL) BY MOUTH TWICE A DAY WITH MEALS 10/11/22  Yes Iran Ouch, MD  doxazosin (CARDURA) 1 MG tablet Take 2 tablets (2 mg total) by mouth at bedtime. 08/12/22  Yes Bensimhon, Bevelyn Buckles, MD  doxycycline (VIBRAMYCIN) 100 MG capsule Take 1 capsule (100 mg total) by mouth 2 (two) times daily. 02/17/23  Yes Becky Augusta, NP  furosemide (LASIX) 40 MG tablet Take 1 tablet (40 mg total) by mouth daily. 06/20/22  Yes Iran Ouch, MD  gabapentin (NEURONTIN) 400 MG capsule Take 1 capsule (400 mg total) by mouth 2 (two) times daily. 01/29/23  Yes Karamalegos, Alexander J, DO  hydrALAZINE (APRESOLINE) 100 MG tablet TAKE 1 TABLET BY MOUTH THREE TIMES A DAY 10/11/22  Yes Iran Ouch, MD  isosorbide mononitrate (IMDUR) 60 MG 24 hr tablet TAKE 1 TABLET BY MOUTH 2 TIMES DAILY. 02/10/23  Yes Iran Ouch, MD  LANTUS SOLOSTAR 100 UNIT/ML Solostar Pen INJECT 40 UNITS INTO THE SKIN AT BEDTIME. 02/05/23  Yes Karamalegos, Netta Neat, DO  LOSARTAN POTASSIUM PO Take 25 mg by mouth daily.   Yes [provider]  ACCU-CHEK FASTCLIX LANCETS MISC Check sugar up to 3 x daily as instructed 08/26/18   Smitty Cords, DO  ACCU-CHEK GUIDE test strip Check blood sugar up to 3 times daily as advised 08/26/18   Karamalegos, Netta Neat, DO  acetaminophen (TYLENOL 8 HOUR) 650 MG CR tablet Take 1 tablet (650 mg total) by mouth every 8 (eight) hours as needed for pain. 08/28/15   Loura Pardon, NP  B-D ULTRAFINE III SHORT PEN 31G X 8 MM MISC USE TO INJECT INSULIN NIGHTLY 01/14/23   Althea Charon, Netta Neat, DO  Lancets Misc. MISC Use  Brand compatable to insurance and monitor to check blood sugar up to 3 times daily. ICD10 E11.9 09/28/21   Smitty Cords, DO    Family History Family History  Problem Relation Age of Onset   Hypertension Mother    Heart disease Maternal Grandfather     Social History Social History   Tobacco Use   Smoking status: Never    Smokeless tobacco: Never  Vaping Use   Vaping status: Never Used  Substance Use Topics   Alcohol use: Yes    Comment: occassionally   Drug use: No     Allergies   Sulfamethoxazole-trimethoprim, Amlodipine, Lisinopril, Jardiance [empagliflozin], and Terbinafine and related   Review of Systems Review of Systems  Constitutional:  Negative for fever.  Skin:  Positive for color change and wound.     Physical Exam Triage Vital Signs ED Triage Vitals  Encounter Vitals Group     BP      Systolic BP Percentile      Diastolic BP Percentile      Pulse      Resp      Temp      Temp src      SpO2  Weight      Height      Head Circumference      Peak Flow      Pain Score      Pain Loc      Pain Education      Exclude from Growth Chart    No data found.  Updated Vital Signs BP (!) 144/70 (BP Location: Right Arm)   Pulse 60   Temp 98.5 F (36.9 C) (Oral)   Resp 16   SpO2 94%   Visual Acuity Right Eye Distance:   Left Eye Distance:   Bilateral Distance:    Right Eye Near:   Left Eye Near:    Bilateral Near:     Physical Exam Vitals and nursing note reviewed.  Constitutional:      Appearance: Normal appearance. He is not ill-appearing.  HENT:     Head: Normocephalic and atraumatic.  Skin:    General: Skin is warm and dry.     Capillary Refill: Capillary refill takes less than 2 seconds.     Findings: Erythema present.  Neurological:     General: No focal deficit present.     Mental Status: He is alert and oriented to person, place, and time.      UC Treatments / Results  Labs (all labs ordered are listed, but only abnormal results are displayed) Labs Reviewed - No data to display  EKG   Radiology No results found.  Procedures Procedures (including critical care time)  Medications Ordered in UC Medications - No data to display  Initial Impression / Assessment and Plan / UC Course  I have reviewed the triage vital signs and the nursing  notes.  Pertinent labs & imaging results that were available during my care of the patient were reviewed by me and considered in my medical decision making (see chart for details).   Patient is a nontoxic-appearing 74 old male presenting for evaluation of a wound on the bottom of his right foot near the ball of his foot that has been present for the last several days.  He has had his right great toe amputated already as a result of a diabetic foot ulcer.  He did contact his podiatrist at [facility] but they do not have an appointment until November.  His PCP urged him to come to urgent care for evaluation.  He has not noticed any drainage.  He has been keeping the wound clean and dry and applying Betadine as previously directed.  There appears to be bleeding under the skin but this may also just be from the Betadine that the patient has been applying.  There is an opening but there is no appreciable pus or discharge noted.  The patient does have a history of chronic kidney disease and has a GFR of 25.  Therefore, I will start him on doxycycline 100 mg twice daily by mouth for 10 days for treatment of presumptive infection and refer him to the wound clinic at [facility] for further management.  I also advised him that he needs to keep the pressure off of the wound is much as possible by wearing his special shoes and also by keeping his feet elevated.  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  Take the doxycycline twice daily with food for 10 days for treatment of potential infection.  I have referred you to the wound clinic at [facility].  They will contact you for an  appointment.     ED Prescriptions     Medication Sig Dispense Auth. Provider   doxycycline (VIBRAMYCIN) 100 MG capsule Take 1 capsule (100 mg total) by mouth 2 (two) times daily. 20 capsule Becky Augusta, NP      PDMP not reviewed this encounter.   Becky Augusta, NP 02/17/23 [phone]

## 2023-02-17 NOTE — Discharge Instructions (Addendum)
Keep your right foot elevated is much as possible and take as much pressure off of the ball of your foot as possible to help prevent expansion of your foot ulcer.  Take the doxycycline twice daily with food for 10 days for treatment of potential infection.  I have referred you to the wound clinic at [facility].  They will contact you for an appointment.

## 2023-02-17 NOTE — Telephone Encounter (Signed)
Will review urgent care note once available

## 2023-02-17 NOTE — Telephone Encounter (Signed)
Chief Complaint: Foot ulcer  Symptoms: wound to the bottom of right foot that is hard and looks red Frequency: onset 2 days ago and constant  Pertinent Negatives: Patient denies fever, chills, nausea vomiting Disposition: [] ED /[x] Urgent Care (no appt availability in office) / [] Appointment(In office/virtual)/ []  [place] [facility]/ [] Home Care/ [] Refused Recommended Disposition /[]  Mobile Bus/ []  Follow-up with PCP Additional Notes: Patient and wife on the line. Patient's wife stated that the patient has type 2DM and has a wound on the bottom of the right foot that looks like a an ulcer. Lupita Leash states he has a Risk analyst but no appts are available until the end of August. Lupita Leash is requesting an appointment before the wound opens up. No availability in the office until Friday. Patient has been scheduled at [facility] today at 1815.  Reason for Disposition  [1] Wound > 48 hours old AND [2] becoming more painful or tender to touch  Answer Assessment - Initial Assessment Questions 1. LOCATION: "Where is the wound located?"      Right foot bottom  2. WOUND APPEARANCE: "What does the wound look like?"      Looks like a foot ulcer and redness  3. SIZE: If redness is present, ask: "What is the size of the red area?" (Inches, centimeters, or compare to size of a coin)      About the size of a nickel  4. SPREAD: "What's changed in the last day?"  "Do you see any red streaks coming from the wound?"     No 5. ONSET: "When did it start to look infected?"      Onset about 2 days ago 6. MECHANISM: "How did the wound start, what was the cause?"     He has numbness in his feet so he did not notice it  7. PAIN: Do you have any pain?"  If Yes, ask: "How bad is the pain?"  (e.g., Scale 1-10; mild, moderate, or severe)    - MILD (1-3): Doesn't interfere with normal activities.     - MODERATE (4-7): Interferes with normal activities or awakens from sleep.    - SEVERE (8-10): Excruciating  pain, unable to do any normal activities.       No 8. FEVER: "Do you have a fever?" If Yes, ask: "What is your temperature, how was it measured, and when did it start?"     No 9. OTHER SYMPTOMS: "Do you have any other symptoms?" (e.g., shaking chills, weakness, rash elsewhere on body)     Bilateral legs and ankles are swollen  Protocols used: Wound Infection Suspected-A-AH

## 2023-02-17 NOTE — ED Triage Notes (Signed)
Pt has neuropathy and noticed a wound on the bottom of his right foot and swelling x 3-4 days.

## 2023-02-19 ENCOUNTER — Emergency Department: Payer: Medicaid Other

## 2023-02-19 ENCOUNTER — Encounter: Payer: Self-pay | Admitting: Internal Medicine

## 2023-02-19 ENCOUNTER — Inpatient Hospital Stay (HOSPITAL_COMMUNITY)
Admit: 2023-02-19 | Discharge: 2023-02-19 | Disposition: A | Discharge status: Home / Self Care | Payer: Medicaid Other | Attending: Internal Medicine | Admitting: Internal Medicine

## 2023-02-19 ENCOUNTER — Other Ambulatory Visit: Payer: Self-pay

## 2023-02-19 ENCOUNTER — Inpatient Hospital Stay
Admission: EM | Admit: 2023-02-19 | Discharge: 2023-02-23 | DRG: 291 | Disposition: A | Discharge status: Home / Self Care | Payer: Medicaid Other | Attending: Internal Medicine | Admitting: Internal Medicine

## 2023-02-19 DIAGNOSIS — I447 Left bundle-branch block, unspecified: Secondary | ICD-10-CM | POA: Diagnosis not present

## 2023-02-19 DIAGNOSIS — I2510 Atherosclerotic heart disease of native coronary artery without angina pectoris: Secondary | ICD-10-CM

## 2023-02-19 DIAGNOSIS — D631 Anemia in chronic kidney disease: Secondary | ICD-10-CM | POA: Diagnosis present

## 2023-02-19 DIAGNOSIS — I5033 Acute on chronic diastolic (congestive) heart failure: Secondary | ICD-10-CM | POA: Diagnosis not present

## 2023-02-19 DIAGNOSIS — I5043 Acute on chronic combined systolic (congestive) and diastolic (congestive) heart failure: Secondary | ICD-10-CM | POA: Diagnosis not present

## 2023-02-19 DIAGNOSIS — N184 Chronic kidney disease, stage 4 (severe): Secondary | ICD-10-CM

## 2023-02-19 DIAGNOSIS — E1142 Type 2 diabetes mellitus with diabetic polyneuropathy: Secondary | ICD-10-CM | POA: Diagnosis present

## 2023-02-19 DIAGNOSIS — Z888 Allergy status to other drugs, medicaments and biological substances status: Secondary | ICD-10-CM

## 2023-02-19 DIAGNOSIS — I428 Other cardiomyopathies: Secondary | ICD-10-CM | POA: Diagnosis not present

## 2023-02-19 DIAGNOSIS — J811 Chronic pulmonary edema: Secondary | ICD-10-CM | POA: Diagnosis not present

## 2023-02-19 DIAGNOSIS — Z6832 Body mass index (BMI) 32.0-32.9, adult: Secondary | ICD-10-CM

## 2023-02-19 DIAGNOSIS — I110 Hypertensive heart disease with heart failure: Secondary | ICD-10-CM | POA: Diagnosis not present

## 2023-02-19 DIAGNOSIS — Z794 Long term (current) use of insulin: Secondary | ICD-10-CM

## 2023-02-19 DIAGNOSIS — I10 Essential (primary) hypertension: Secondary | ICD-10-CM | POA: Diagnosis not present

## 2023-02-19 DIAGNOSIS — L089 Local infection of the skin and subcutaneous tissue, unspecified: Secondary | ICD-10-CM | POA: Insufficient documentation

## 2023-02-19 DIAGNOSIS — I5023 Acute on chronic systolic (congestive) heart failure: Secondary | ICD-10-CM

## 2023-02-19 DIAGNOSIS — R0609 Other forms of dyspnea: Secondary | ICD-10-CM | POA: Diagnosis not present

## 2023-02-19 DIAGNOSIS — E669 Obesity, unspecified: Secondary | ICD-10-CM | POA: Diagnosis present

## 2023-02-19 DIAGNOSIS — Z7982 Long term (current) use of aspirin: Secondary | ICD-10-CM

## 2023-02-19 DIAGNOSIS — I5042 Chronic combined systolic (congestive) and diastolic (congestive) heart failure: Secondary | ICD-10-CM | POA: Diagnosis present

## 2023-02-19 DIAGNOSIS — Z882 Allergy status to sulfonamides status: Secondary | ICD-10-CM

## 2023-02-19 DIAGNOSIS — F1010 Alcohol abuse, uncomplicated: Secondary | ICD-10-CM | POA: Diagnosis present

## 2023-02-19 DIAGNOSIS — R0602 Shortness of breath: Secondary | ICD-10-CM | POA: Diagnosis not present

## 2023-02-19 DIAGNOSIS — Z8249 Family history of ischemic heart disease and other diseases of the circulatory system: Secondary | ICD-10-CM

## 2023-02-19 DIAGNOSIS — I130 Hypertensive heart and chronic kidney disease with heart failure and stage 1 through stage 4 chronic kidney disease, or unspecified chronic kidney disease: Principal | ICD-10-CM | POA: Diagnosis present

## 2023-02-19 DIAGNOSIS — E1122 Type 2 diabetes mellitus with diabetic chronic kidney disease: Secondary | ICD-10-CM | POA: Diagnosis not present

## 2023-02-19 DIAGNOSIS — I509 Heart failure, unspecified: Secondary | ICD-10-CM | POA: Diagnosis not present

## 2023-02-19 DIAGNOSIS — E11649 Type 2 diabetes mellitus with hypoglycemia without coma: Secondary | ICD-10-CM | POA: Diagnosis not present

## 2023-02-19 DIAGNOSIS — E859 Amyloidosis, unspecified: Secondary | ICD-10-CM | POA: Diagnosis present

## 2023-02-19 DIAGNOSIS — I255 Ischemic cardiomyopathy: Secondary | ICD-10-CM | POA: Diagnosis not present

## 2023-02-19 DIAGNOSIS — J9601 Acute respiratory failure with hypoxia: Secondary | ICD-10-CM | POA: Diagnosis present

## 2023-02-19 DIAGNOSIS — J90 Pleural effusion, not elsewhere classified: Secondary | ICD-10-CM | POA: Diagnosis not present

## 2023-02-19 DIAGNOSIS — E785 Hyperlipidemia, unspecified: Secondary | ICD-10-CM | POA: Diagnosis present

## 2023-02-19 DIAGNOSIS — N2581 Secondary hyperparathyroidism of renal origin: Secondary | ICD-10-CM | POA: Diagnosis not present

## 2023-02-19 DIAGNOSIS — S98139A Complete traumatic amputation of one unspecified lesser toe, initial encounter: Secondary | ICD-10-CM | POA: Diagnosis present

## 2023-02-19 DIAGNOSIS — N179 Acute kidney failure, unspecified: Secondary | ICD-10-CM | POA: Diagnosis not present

## 2023-02-19 DIAGNOSIS — I129 Hypertensive chronic kidney disease with stage 1 through stage 4 chronic kidney disease, or unspecified chronic kidney disease: Secondary | ICD-10-CM | POA: Diagnosis present

## 2023-02-19 DIAGNOSIS — Z79899 Other long term (current) drug therapy: Secondary | ICD-10-CM | POA: Diagnosis not present

## 2023-02-19 DIAGNOSIS — N189 Chronic kidney disease, unspecified: Secondary | ICD-10-CM | POA: Diagnosis not present

## 2023-02-19 DIAGNOSIS — R001 Bradycardia, unspecified: Secondary | ICD-10-CM | POA: Diagnosis not present

## 2023-02-19 DIAGNOSIS — Z955 Presence of coronary angioplasty implant and graft: Secondary | ICD-10-CM

## 2023-02-19 LAB — TROPONIN I (HIGH SENSITIVITY)
Troponin I (High Sensitivity): 17 ng/L (ref <18)
Troponin I (High Sensitivity): 18 ng/L — ABNORMAL HIGH (ref <18)

## 2023-02-19 LAB — ECHOCARDIOGRAM COMPLETE
Height: 68 in
Weight: 3424 oz

## 2023-02-19 LAB — COMPREHENSIVE METABOLIC PANEL
ALT: 21 U/L (ref 0–44)
AST: 21 U/L (ref 15–41)
Albumin: 3.3 g/dL — ABNORMAL LOW (ref 3.5–5.0)
Alkaline Phosphatase: 50 U/L (ref 38–126)
Anion gap: 6 (ref 5–15)
BUN: 83 mg/dL — ABNORMAL HIGH (ref 8–23)
CO2: 20 mmol/L — ABNORMAL LOW (ref 22–32)
Calcium: 8.1 mg/dL — ABNORMAL LOW (ref 8.9–10.3)
Chloride: 110 mmol/L (ref 98–111)
Creatinine, Ser: 3.83 mg/dL — ABNORMAL HIGH (ref 0.61–1.24)
GFR, Estimated: 17 mL/min — ABNORMAL LOW (ref >60)
Glucose, Bld: 133 mg/dL — ABNORMAL HIGH (ref 70–99)
Potassium: 5.4 mmol/L — ABNORMAL HIGH (ref 3.5–5.1)
Sodium: 136 mmol/L (ref 135–145)
Total Bilirubin: 0.6 mg/dL (ref 0.3–1.2)
Total Protein: 6.7 g/dL (ref 6.5–8.1)

## 2023-02-19 LAB — CBC
HCT: 32.3 % — ABNORMAL LOW (ref 39.0–52.0)
Hemoglobin: 10.7 g/dL — ABNORMAL LOW (ref 13.0–17.0)
MCH: 34.3 pg — ABNORMAL HIGH (ref 26.0–34.0)
MCHC: 33.1 g/dL (ref 30.0–36.0)
MCV: 103.5 fL — ABNORMAL HIGH (ref 80.0–100.0)
Platelets: 172 10*3/uL (ref 150–400)
RBC: 3.12 MIL/uL — ABNORMAL LOW (ref 4.22–5.81)
RDW: 13.1 % (ref 11.5–15.5)
WBC: 5.6 10*3/uL (ref 4.0–10.5)
nRBC: 0 % (ref 0.0–0.2)

## 2023-02-19 LAB — CBG MONITORING, ED
Glucose-Capillary: 112 mg/dL — ABNORMAL HIGH (ref 70–99)
Glucose-Capillary: 162 mg/dL — ABNORMAL HIGH (ref 70–99)

## 2023-02-19 LAB — BRAIN NATRIURETIC PEPTIDE: B Natriuretic Peptide: 1502.1 pg/mL — ABNORMAL HIGH (ref 0.0–100.0)

## 2023-02-19 MED ORDER — ACETAMINOPHEN 325 MG PO TABS: 650.0000 mg | ORAL_TABLET | Freq: Four times a day (QID) | ORAL | Status: DC | PRN

## 2023-02-19 MED ORDER — HYDRALAZINE HCL 20 MG/ML IJ SOLN: 5.0000 mg | Freq: Four times a day (QID) | INTRAMUSCULAR | Status: DC | PRN

## 2023-02-19 MED ORDER — DOXYCYCLINE HYCLATE 100 MG PO TABS: 100.0000 mg | ORAL_TABLET | Freq: Two times a day (BID) | ORAL | Status: DC

## 2023-02-19 MED ORDER — DOXYCYCLINE HYCLATE 100 MG PO TABS
100.0000 mg | ORAL_TABLET | Freq: Two times a day (BID) | ORAL | Status: DC
  Administered 2023-02-19 – 2023-02-23 (×8): 100 mg via ORAL
  Filled 2023-02-19 (×8): qty 1

## 2023-02-19 MED ORDER — ASPIRIN 81 MG PO TBEC
81.0000 mg | DELAYED_RELEASE_TABLET | Freq: Every day | ORAL | Status: DC
  Administered 2023-02-19 – 2023-02-23 (×5): 81 mg via ORAL
  Filled 2023-02-19 (×5): qty 1

## 2023-02-19 MED ORDER — HEPARIN SODIUM (PORCINE) 5000 UNIT/ML IJ SOLN
5000.0000 [IU] | Freq: Three times a day (TID) | INTRAMUSCULAR | Status: DC
  Administered 2023-02-19 – 2023-02-22 (×10): 5000 [IU] via SUBCUTANEOUS
  Filled 2023-02-19 (×12): qty 1

## 2023-02-19 MED ORDER — GABAPENTIN 400 MG PO CAPS
400.0000 mg | ORAL_CAPSULE | Freq: Two times a day (BID) | ORAL | Status: DC
  Administered 2023-02-19 – 2023-02-23 (×8): 400 mg via ORAL
  Filled 2023-02-19 (×8): qty 1

## 2023-02-19 MED ORDER — FUROSEMIDE 10 MG/ML IJ SOLN
40.0000 mg | Freq: Two times a day (BID) | INTRAMUSCULAR | Status: DC
  Administered 2023-02-20: 40 mg via INTRAVENOUS
  Filled 2023-02-19: qty 4

## 2023-02-19 MED ORDER — ONDANSETRON HCL 4 MG PO TABS: 4.0000 mg | ORAL_TABLET | Freq: Four times a day (QID) | ORAL | Status: DC | PRN

## 2023-02-19 MED ORDER — ACETAMINOPHEN 650 MG RE SUPP: 650.0000 mg | Freq: Four times a day (QID) | RECTAL | Status: DC | PRN

## 2023-02-19 MED ORDER — MORPHINE SULFATE (PF) 2 MG/ML IV SOLN
2.0000 mg | INTRAVENOUS | Status: DC | PRN
  Administered 2023-02-19: 2 mg via INTRAVENOUS
  Filled 2023-02-19: qty 1

## 2023-02-19 MED ORDER — ONDANSETRON HCL 4 MG/2ML IJ SOLN: 4.0000 mg | Freq: Four times a day (QID) | INTRAMUSCULAR | Status: DC | PRN

## 2023-02-19 MED ORDER — CARVEDILOL 6.25 MG PO TABS: 6.2500 mg | ORAL_TABLET | Freq: Two times a day (BID) | ORAL | Status: DC

## 2023-02-19 MED ORDER — INSULIN ASPART 100 UNIT/ML IJ SOLN
0.0000 [IU] | Freq: Every day | INTRAMUSCULAR | Status: DC
  Filled 2023-02-19: qty 1

## 2023-02-19 MED ORDER — INSULIN ASPART 100 UNIT/ML IJ SOLN
0.0000 [IU] | Freq: Three times a day (TID) | INTRAMUSCULAR | Status: DC
  Administered 2023-02-20: 3 [IU] via SUBCUTANEOUS
  Filled 2023-02-19: qty 1

## 2023-02-19 MED ORDER — SENNOSIDES-DOCUSATE SODIUM 8.6-50 MG PO TABS: 1.0000 | ORAL_TABLET | Freq: Every evening | ORAL | Status: DC | PRN

## 2023-02-19 MED ORDER — CARVEDILOL 6.25 MG PO TABS
6.2500 mg | ORAL_TABLET | Freq: Two times a day (BID) | ORAL | Status: DC
  Administered 2023-02-19 – 2023-02-23 (×8): 6.25 mg via ORAL
  Filled 2023-02-19 (×8): qty 1

## 2023-02-19 MED ORDER — ISOSORBIDE MONONITRATE ER 60 MG PO TB24
60.0000 mg | ORAL_TABLET | Freq: Two times a day (BID) | ORAL | Status: DC
  Administered 2023-02-19 – 2023-02-23 (×8): 60 mg via ORAL
  Filled 2023-02-19 (×8): qty 1

## 2023-02-19 MED ORDER — ATORVASTATIN CALCIUM 20 MG PO TABS
40.0000 mg | ORAL_TABLET | Freq: Every day | ORAL | Status: DC
  Administered 2023-02-19 – 2023-02-22 (×4): 40 mg via ORAL
  Filled 2023-02-19 (×4): qty 2

## 2023-02-19 MED ORDER — FUROSEMIDE 10 MG/ML IJ SOLN
80.0000 mg | Freq: Once | INTRAMUSCULAR | Status: AC
  Administered 2023-02-19: 80 mg via INTRAVENOUS
  Filled 2023-02-19: qty 8

## 2023-02-19 MED ORDER — HYDRALAZINE HCL 50 MG PO TABS
100.0000 mg | ORAL_TABLET | Freq: Three times a day (TID) | ORAL | Status: DC
  Administered 2023-02-19 – 2023-02-23 (×12): 100 mg via ORAL
  Filled 2023-02-19 (×12): qty 2

## 2023-02-19 MED ORDER — DOXAZOSIN MESYLATE 2 MG PO TABS: 2.0000 mg | ORAL_TABLET | Freq: Every day | ORAL | Status: DC

## 2023-02-19 NOTE — Assessment & Plan Note (Addendum)
Avoid nephrotoxic agents Based on renal function panel on 7/3 and CMP on 7/18

## 2023-02-19 NOTE — Consult Note (Signed)
Cardiology Consultation   Patient ID: [name] MRN: [id number]; DOB: [birth year]  Admit date: 02/19/2023 Date of Consult: 02/19/2023  PCP:  Smitty Cords, DO   [facility] Providers Cardiologist:  Lorine Bears, MD   {  Patient Profile:   [name] is a 62 y.o. male with a hx of CAD, chronic systolic heart failure, diabetes type 2, hypertension, CKD stage IV, EtOH abuse, and PAD who is being seen 02/19/2023 for the evaluation of CHF at the request of Dr. Sedalia Muta.  History of Present Illness:   [name] has been followed by the advanced heart failure team.  He is status post drug-eluting stent placement to the LAD in 10/2016 in the setting of acute heart failure.  At that time, he was found to have severe LV dysfunction with an EF of 25 to 30% which was felt to be out of proportion to the LAD disease.  He also has a history of alcohol abuse.  Follow-up echo in 2018 showed EF of 40 to 45%.  Echo in 2022 showed EF of 45 to 50%.  There was concern for possible cardiac amyloidosis.  PYP scan was equivocal for amyloidosis.  Echo in January 2024 showed EF of 55 to 60%.  [name] was not covered by insurance, and [name] was stopped due to rash.  He is on disability.  Patient was last seen in June 2024 on Lasix 40 mg daily.  He was not on Entresto or Cleda Daub due to CKD stage IV.  He was on hydralazine and Imdur. Labs, PFTs and sleep study were ordered/recommended.   Patient presented to the ER on 02/19/2023 with shortness of breath.  He reports 2 days of worsening shortness of breath. Also noted lower leg edema. He reports compliance with his lasix. Says he has been eating a lot of salt. He denies chest pain. He reports stable orthopnea.   In the ER blood pressure 152/73, heart rate 57, respiratory rate 18, afebrile, 94% O2 on room air.  Labs showed sodium 136, potassium 5.4, bicarb 20, serum creatinine 3.83, BUN 83, EGFR 17, blood glucose 133, WBC 5.2, hemoglobin  10.7, platelets 172, BNP 1502.1.  High-sensitivity troponin 17.  Chest x-ray showed mild pulmonary edema with trace bilateral pleural effusions.  EKG showed normal sinus rhythm, LBBB, 50 bpm, nonspecific ST changes patient was given IV Lasix.   Past Medical History:  Diagnosis Date   Ankle pain    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 10/2016 Echo: EF 25-30%, diff HK; b. 01/2017 Echo:  EF 40-45%, GrI DD; c. 08/2017 Echo: EF 40-45%, GrI DD; d. 08/2020 Echo: EF 45-50%, glob HK. Mod asymm LVH, GrI DD, Nl RV size/fxn; e. 08/2020 PYP: equivocal for ATTR cardiac amyloid.   CKD (chronic kidney disease), stage III - IV (HCC)    Coronary artery disease    a. 10/2016 Cath/PCI: LAD 61m (3.5x15 Xience Alpine DES). No other obstructive disease.   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 10/2016 Echo: EF 25-30%, diff HK; b. 01/2017 Echo:  EF 40-45%, GrI DD; c. 08/2017 Echo: EF 40-45%, GrI DD; d. 08/2020 Echo: EF 45-50%, glob HK. Mod asymm LVH, GrI DD, Nl RV size/fxn.   Pain in both feet     Past Surgical History:  Procedure Laterality Date   CARDIAC CATHETERIZATION  CORONARY STENT INTERVENTION N/A 10/21/2016   Procedure: Coronary Stent Intervention;  Surgeon: Iran Ouch, MD;  Location: [facility] INVASIVE CV LAB;  Service: Cardiovascular;  Laterality: N/A;   LEFT HEART CATH AND CORONARY ANGIOGRAPHY N/A 10/21/2016   Procedure: Left Heart Cath and Coronary Angiography;  Surgeon: Iran Ouch, MD;  Location: [facility] INVASIVE CV LAB;  Service: Cardiovascular;  Laterality: N/A;   TIBIA FRACTURE SURGERY Right 2002     Home Medications:  Prior to Admission medications   Medication Sig Start Date End Date Taking? Authorizing Provider  ACCU-CHEK FASTCLIX LANCETS MISC Check sugar up to 3 x daily as instructed 08/26/18  Yes Karamalegos, Netta Neat, DO  ACCU-CHEK GUIDE test  strip Check blood sugar up to 3 times daily as advised 08/26/18  Yes Karamalegos, Netta Neat, DO  acetaminophen (TYLENOL 8 HOUR) 650 MG CR tablet Take 1 tablet (650 mg total) by mouth every 8 (eight) hours as needed for pain. 08/28/15  Yes Krebs, Laurel Dimmer, NP  aspirin EC 81 MG tablet Take 81 mg by mouth daily.   Yes [provider]  atorvastatin (LIPITOR) 40 MG tablet TAKE 1 TABLET BY MOUTH EVERY DAY 02/10/23  Yes Iran Ouch, MD  B-D ULTRAFINE III SHORT PEN 31G X 8 MM MISC USE TO INJECT INSULIN NIGHTLY 01/14/23  Yes Karamalegos, Alexander J, DO  carvedilol (COREG) 25 MG tablet TAKE 1 TABLET (25 MG TOTAL) BY MOUTH TWICE A DAY WITH MEALS 10/11/22  Yes Iran Ouch, MD  doxazosin (CARDURA) 1 MG tablet Take 2 tablets (2 mg total) by mouth at bedtime. 08/12/22  Yes Bensimhon, Bevelyn Buckles, MD  doxycycline (VIBRAMYCIN) 100 MG capsule Take 1 capsule (100 mg total) by mouth 2 (two) times daily. 02/17/23  Yes Becky Augusta, NP  furosemide (LASIX) 20 MG tablet Take 10 mg by mouth daily.   Yes [provider]  gabapentin (NEURONTIN) 400 MG capsule Take 1 capsule (400 mg total) by mouth 2 (two) times daily. 01/29/23  Yes Karamalegos, Alexander J, DO  hydrALAZINE (APRESOLINE) 100 MG tablet TAKE 1 TABLET BY MOUTH THREE TIMES A DAY 10/11/22  Yes Iran Ouch, MD  isosorbide mononitrate (IMDUR) 60 MG 24 hr tablet TAKE 1 TABLET BY MOUTH 2 TIMES DAILY. 02/10/23  Yes Iran Ouch, MD  Lancets Misc. MISC Use  Brand compatable to insurance and monitor to check blood sugar up to 3 times daily. ICD10 E11.9 09/28/21  Yes Karamalegos, Netta Neat, DO  LANTUS SOLOSTAR 100 UNIT/ML Solostar Pen INJECT 40 UNITS INTO THE SKIN AT BEDTIME. 02/05/23  Yes Karamalegos, Netta Neat, DO  losartan (COZAAR) 25 MG tablet Take 25 mg by mouth daily.   Yes [provider]  furosemide (LASIX) 40 MG tablet Take 1 tablet (40 mg total) by mouth daily. Patient not taking: Reported on 02/19/2023 06/20/22   Iran Ouch, MD    Inpatient Medications: Scheduled Meds:  doxycycline  100 mg Oral BID   [START ON 02/20/2023] furosemide  40 mg Intravenous BID   heparin  5,000 Units Subcutaneous Q8H   hydrALAZINE  100 mg Oral TID   insulin aspart  0-5 Units Subcutaneous QHS   insulin aspart  0-9 Units Subcutaneous TID WC   isosorbide mononitrate  60 mg Oral BID   Continuous Infusions:  PRN Meds: acetaminophen **OR** acetaminophen, ondansetron **OR** ondansetron (ZOFRAN) IV, senna-docusate  Allergies:    Allergies  Allergen Reactions   Sulfamethoxazole-Trimethoprim Other (See Comments)    AKI  Amlodipine Other (See Comments)    Edema   Lisinopril     Hyperkalemia    Jardiance [Empagliflozin] Rash   Terbinafine And Related Rash    Social History:   Social History   Socioeconomic History   Marital status: Married    Spouse name: Not on file   Number of children: Not on file   Years of education: Not on file   Highest education level: Not on file  Occupational History   Not on file  Tobacco Use   Smoking status: Never   Smokeless tobacco: Never  Vaping Use   Vaping status: Never Used  Substance and Sexual Activity   Alcohol use: Yes    Comment: occassionally   Drug use: No   Sexual activity: Not Currently  Other Topics Concern   Not on file  Social History Narrative   Not on file   Social Determinants of Health   Financial Resource Strain: Not on file  Food Insecurity: No Food Insecurity (04/02/2019)   [facility] from [facility], [facility]   Hunger Vital Sign    Worried About Running Out of Food in the Last Year: Never true    Ran Out of Food in the Last Year: Never true  Transportation Needs: Not on file  Physical Activity: Not on file  Stress: Not on file  Social Connections: Not on file  Intimate Partner Violence: Not on file    Family History:    Family History  Problem Relation Age of Onset   Hypertension Mother    Heart disease Maternal  Grandfather      ROS:  Please see the history of present illness.   All other ROS reviewed and negative.     Physical Exam/Data:   Vitals:   02/19/23 1151 02/19/23 1254  BP:  (!) 152/73  Pulse:  (!) 57  Resp:  18  Temp:  97.6 F (36.4 C)  TempSrc:  Oral  SpO2:  94%  Weight: 97.1 kg   Height: 5\' 8"  (1.727 m)    No intake or output data in the 24 hours ending 02/19/23 1447    02/19/2023   11:51 AM 01/29/2023    8:22 AM 12/30/2022   10:47 AM  Last 3 Weights  Weight (lbs) 214 lb 214 lb 212 lb 3.2 oz  Weight (kg) 97.07 kg 97.07 kg 96.253 kg     Body mass index is 32.54 kg/m.  General:  Well nourished, well developed, in no acute distress HEENT: normal Neck: + JVD Vascular: No carotid bruits; Distal pulses 2+ bilaterally Cardiac:  normal S1, S2; RRR; no murmur  Lungs:  crackles at bases Abd: soft, nontender, no hepatomegaly  Ext: 2+ lower leg edema Musculoskeletal:  No deformities, BUE and BLE strength normal and equal Skin: warm and dry  Neuro:  CNs 2-12 intact, no focal abnormalities noted Psych:  Normal affect   EKG:  The EKG was personally reviewed and demonstrates:  SB 58bpm, LBBB, nonspecific ST changes Telemetry:  Telemetry was personally reviewed and demonstrates:  SB HR 50s  Relevant CV Studies:  Echo 07/2022  1. Left ventricular ejection fraction, by estimation, is 55 to 60%. The  left ventricle has normal function. The left ventricle has no regional  wall motion abnormalities. There is moderate concentric left ventricular  hypertrophy. Left ventricular  diastolic parameters are consistent with Grade II diastolic dysfunction  (pseudonormalization).   2. Right ventricular systolic function is normal. The right ventricular  size is  normal. There is normal pulmonary artery systolic pressure. The  estimated right ventricular systolic pressure is 12.4 mmHg.   3. Left atrial size was mildly dilated.   4. The mitral valve is normal in structure. Mild mitral  valve  regurgitation. No evidence of mitral stenosis. Moderate mitral annular  calcification.   5. The aortic valve is normal in structure. Aortic valve regurgitation is  not visualized. Aortic valve sclerosis is present, with no evidence of  aortic valve stenosis.   6. The inferior vena cava is normal in size with greater than 50%  respiratory variability, suggesting right atrial pressure of 3 mmHg.    Myocardial amyloid imaging 2022 Findings equivocal for ATTR cardiac amyloid with H/CL ratio 1.1 and grade 2 tracer uptake.   Laurance Flatten, MD    Echo 08/2020  1. Left ventricular ejection fraction, by estimation, is 45 to 50%. Left  ventricular ejection fraction by 3D volume is 47 %. The left ventricle has  mildly decreased function. The left ventricle demonstrates global  hypokinesis. There is moderate  asymmetric left ventricular hypertrophy of the septal segment. Left  ventricular diastolic parameters are consistent with Grade I diastolic  dysfunction (impaired relaxation). The average left ventricular global  longitudinal strain is -10.7 %. The global  longitudinal strain is abnormal.   2. Right ventricular systolic function is low normal. The right  ventricular size is normal.   3. The mitral valve is normal in structure. No evidence of mitral valve  regurgitation.   4. The aortic valve is tricuspid. Aortic valve regurgitation is not  visualized. Mild aortic valve sclerosis is present, with no evidence of  aortic valve stenosis.   5. The inferior vena cava is dilated in size with >50% respiratory  variability, suggesting right atrial pressure of 8 mmHg.    Echo 08/2017 Study Conclusions   - Left ventricle: The cavity size was normal. There was mild    concentric hypertrophy. Systolic function was mildly to    moderately reduced. The estimated ejection fraction was in the    range of 40% to 45%. Hypokinesis of the septal, anteroseptal    myocardium, possibly  secondary to conduction abnormality.    Hypokinesis of the anterior myocardium. Doppler parameters are    consistent with abnormal left ventricular relaxation (grade 1    diastolic dysfunction).  - Mitral valve: There was mild regurgitation.  - Left atrium: The atrium was mildly dilated.  - Right ventricle: Systolic function was normal.  - Pulmonary arteries: Systolic pressure could not be accurately    estimated.   Impressions:   - Comparwed to prior study 2018, function and regions of wall    motion are unchanged.   LHC 10/2016 Ost 2nd Diag to 2nd Diag lesion, 60 %stenosed. Mid Cx lesion, 50 %stenosed. Prox RCA lesion, 40 %stenosed. Mid RCA lesion, 20 %stenosed. A STENT XIENCE ALPINE RX 3.5X15 drug eluting stent was successfully placed. Mid LAD lesion, 90 %stenosed. Post intervention, there is a 0% residual stenosis.   1. Severe one-vessel coronary artery disease involving mid LAD. 2. Severely reduced LV systolic function by echo. Left ventricular angiography was not performed. LVEDP was only mildly elevated at 15 mmHg. 3. Successful angioplasty and drug-eluting stent placement to the mid LAD.   Recommendations: Dual antiplatelet therapy for at least one year. Continue treatment for systolic heart failure. Resume furosemide 40 mg once daily tomorrow morning. The patient can likely be discharged home tomorrow if no complications.    Echo 2018  Study Conclusions   - Procedure narrative: Transthoracic echocardiography. Image    quality was poor. The study was technically difficult, as a    result of poor acoustic windows and poor sound wave transmission.    Intravenous contrast (Definity) was administered.  - Left ventricle: The cavity size was mildly dilated. Systolic    function was severely reduced. The estimated ejection fraction    was in the range of 25% to 30%. Diffuse hypokinesis.  - Mitral valve: There was mild regurgitation.  - Left atrium: The atrium was mildly  dilated.   Laboratory Data:  High Sensitivity Troponin:   Recent Labs  Lab 02/19/23 1155  TROPONINIHS 17     Chemistry Recent Labs  Lab 02/19/23 1155  NA 136  K 5.4*  CL 110  CO2 20*  GLUCOSE 133*  BUN 83*  CREATININE 3.83*  CALCIUM 8.1*  GFRNONAA 17*  ANIONGAP 6    Recent Labs  Lab 02/19/23 1155  PROT 6.7  ALBUMIN 3.3*  AST 21  ALT 21  ALKPHOS 50  BILITOT 0.6   Lipids No results for input(s): "CHOL", "TRIG", "HDL", "LABVLDL", "LDLCALC", "CHOLHDL" in the last 168 hours.  Hematology Recent Labs  Lab 02/19/23 1155  WBC 5.6  RBC 3.12*  HGB 10.7*  HCT 32.3*  MCV 103.5*  MCH 34.3*  MCHC 33.1  RDW 13.1  PLT 172   Thyroid No results for input(s): "TSH", "FREET4" in the last 168 hours.  BNP Recent Labs  Lab 02/19/23 1155  BNP 1,502.1*    DDimer No results for input(s): "DDIMER" in the last 168 hours.   Radiology/Studies:  DG Chest 2 View  Result Date: 02/19/2023 CLINICAL DATA:  Several day history of shortness of breath EXAM: CHEST - 2 VIEW COMPARISON:  Chest radiograph dated 10/18/2016 FINDINGS: Normal lung volumes. Mild bilateral interstitial opacities. Trace bilateral pleural effusions. No pneumothorax. Similar mildly enlarged cardiomediastinal silhouette. No acute osseous abnormality. IMPRESSION: 1. Mild pulmonary edema. 2. Trace bilateral pleural effusions. Electronically Signed   By: Agustin Cree M.D.   On: 02/19/2023 13:39     Assessment and Plan:   Acute on chronic systolic heart failure -Patient presented with 2 days of worsening shortness of breath and lower leg edema.  He reports compliance with Lasix.  Reports increased salt intake -BNP 1500 and chest x-ray with mild pulmonary edema and trace bilateral pleural effusions -Status post IV Lasix 80mg  in the ER -Echo 2018 showed EF of 20 to 25% -Echo in 2022 showed EF of 45 to 50%, moderate LVH -Echo 08/08/2022 showed EF of 55 to 60% -Prior PYP scan with low suspicion for amyloid -PTA Lasix 40 mg  daily -Plan to admit and start IV Lasix 40 mg twice daily -Not on Entresto or spironolactone due to CKD -Continue PTA hydralazine and Imdur -Insurance did not cover [place] and Jardiance caused a rash -Monitor strict I's and O's, daily weights, kidney function with diuresis  CAD status post prior stenting to the LAD in April 2018 -No chest pain reported -High-sensitivity troponin negative -Not a candidate for repeat cath with CKD -Continue aspirin and statin  Hypertension -Continue PTA hydralazine and Imdur  AKI on CKD stage IV -Creatinine baseline 3.1-3.3 -On admission serum creatinine 3.83, BUN 83 -Monitor with diuresis  For questions or updates, please contact [facility] Please consult [email or web address] for contact info under    Signed,  David Stall, PA-C  02/19/2023 2:47 PM

## 2023-02-19 NOTE — Assessment & Plan Note (Addendum)
On CKD 3B/4, suspect cardiorenal Strict I's and O's Treat per heart failure exacerbation, status post furosemide 80 mg IV one-time dose per ED doctor Avoid nephrotoxic agents Recheck BMP in a.m.

## 2023-02-19 NOTE — TOC CM/SW Note (Signed)
Cm received TOC consult for SNF. Cm awaiting PT/OT evals for recommendations.

## 2023-02-19 NOTE — Assessment & Plan Note (Addendum)
Suspect secondary to no diet restriction including increase in consumption of salt containing products and excessive fluid intake Counseled patient regarding fluid intake restriction at bedside Strict I's and O's Status post furosemide 80 mg IV one-time dose per EDP EDP consulted cardiology and per EDP, Dr. Myriam Forehand will see the patient, per cardiology, repeat echo ordered Furosemide 40 mg IV twice daily, 2 doses ordered for 8/7

## 2023-02-19 NOTE — Assessment & Plan Note (Signed)
Patient had last A1c was 8.6 on 01/30/23 Insulin SSI with issues coverage ordered

## 2023-02-19 NOTE — Assessment & Plan Note (Signed)
Patient is not on ACE/ARB/Entresto due to CKD

## 2023-02-19 NOTE — Assessment & Plan Note (Signed)
Home Coreg 6.25 twice daily hydralazine 100 mg 3 times daily, Imdur p.o. twice daily resumed on admission Hydralazine 5 mg IV every 6 hours as needed for SBP greater than 135, 4 days order

## 2023-02-19 NOTE — ED Provider Notes (Signed)
[facility] Provider Note    Event Date/Time   First MD Initiated Contact with Patient 02/19/23 1200     (approximate)   History   Shortness of Breath   HPI  [name] is a 62 y.o. male review of hospital records including urgent care note from August 5 has a history of type 2 diabetes chronic kidney disease retinopathy cardiomyopathy coronary disease and recent wound on his right foot, currently on doxycycline    Patient has developed increasing feeling of shortness of breath especially when he lays down better when he sits up for the last 2 to 3 days.  He is also had increasing swelling in both of his lower legs for about 1 week.  Reports a history of congestive heart failure  No fevers or chills.  No chest pain.  No cough.  Regarding his right foot, the redness has improved dramatically.  He is feeling much better and has an appointment to follow-up with the wound clinic already established.   Patient is followed by Dr. Gala Romney Physical Exam   Triage Vital Signs: ED Triage Vitals [02/19/23 1151]  Encounter Vitals Group     BP      Systolic BP Percentile      Diastolic BP Percentile      Pulse      Resp      Temp      Temp src      SpO2      Weight 214 lb (97.1 kg)     Height 5\' 8"  (1.727 m)     Head Circumference      Peak Flow      Pain Score 0     Pain Loc      Pain Education      Exclude from Growth Chart     Most recent vital signs: Vitals:   02/19/23 1254  BP: (!) 152/73  Pulse: (!) 57  Resp: 18  Temp: 97.6 F (36.4 C)  SpO2: 94%     General: Awake, no distress.  Sitting upright without distress.  Normal work of breathing CV:  Good peripheral perfusion.  Normal tones and rate Resp:  Normal effort.  Mild rales noted in the bases bilaterally.  No wheezing.  No rhonchi.  Speaks in full clear sentences oxygen saturation 95% on room air Abd:  No distention.  Soft nontender.  Patient reports he does feel like he has  a slight amount of swelling in his abdomen that accompanied his increased swelling in his feet Other:  Moderate bilateral lower extremity pitting edema bilateral.  Base of the right plantar surface of the foot and right great does not show erythema fluctuance or drainage.  Patient reports redness has improved markedly and he has been checking it regularly   ED Results / Procedures / Treatments   Labs (all labs ordered are listed, but only abnormal results are displayed) Labs Reviewed  CBC - Abnormal; Notable for the following components:      Result Value   RBC 3.12 (*)    Hemoglobin 10.7 (*)    HCT 32.3 (*)    MCV 103.5 (*)    MCH 34.3 (*)    All other components within normal limits  BRAIN NATRIURETIC PEPTIDE - Abnormal; Notable for the following components:   B Natriuretic Peptide 1,502.1 (*)    All other components within normal limits  COMPREHENSIVE METABOLIC PANEL - Abnormal; Notable for the following components:   Potassium  5.4 (*)    CO2 20 (*)    Glucose, Bld 133 (*)    BUN 83 (*)    Creatinine, Ser 3.83 (*)    Calcium 8.1 (*)    Albumin 3.3 (*)    GFR, Estimated 17 (*)    All other components within normal limits  TROPONIN I (HIGH SENSITIVITY)  TROPONIN I (HIGH SENSITIVITY)     EKG  And interpreted by me at noon heart rate 60 QRS 150 QTc 500 Sinus bradycardia, left bundle branch block.  No evidence of acute ischemia   RADIOLOGY  Chest x-ray interpreted by me as vascular congestion suspicous for edema   DG Chest 2 View  Result Date: 02/19/2023 CLINICAL DATA:  Several day history of shortness of breath EXAM: CHEST - 2 VIEW COMPARISON:  Chest radiograph dated 10/18/2016 FINDINGS: Normal lung volumes. Mild bilateral interstitial opacities. Trace bilateral pleural effusions. No pneumothorax. Similar mildly enlarged cardiomediastinal silhouette. No acute osseous abnormality. IMPRESSION: 1. Mild pulmonary edema. 2. Trace bilateral pleural effusions. Electronically  Signed   By: Agustin Cree M.D.   On: 02/19/2023 13:39      PROCEDURES:  Critical Care performed: No  Procedures   MEDICATIONS ORDERED IN ED: Medications  furosemide (LASIX) injection 80 mg (has no administration in time range)     IMPRESSION / MDM / ASSESSMENT AND PLAN / ED COURSE  I reviewed the triage vital signs and the nursing notes.                              Differential diagnosis includes, but is not limited to, CHF, CHF exacerbation, less likely ACS as a lack of chest pain and troponin normal he has been experiencing symptoms for 2 days, describes nighttime dyspnea, orthopnea, and has evidence of peripheral edema highly suggestive of volume overload state.  He denies any ongoing infectious symptoms his right great toe appears to be healing well and he will continue doxycycline.  No clinical signs or symptoms highly suggestive of thromboembolism such as PE.  His work of breathing is quite comfortable but he is symptomatic.  Labs reveal increased BNP normal troponin.  His creatinine is 3.8 and potassium 5.4.  Mild anemia  Discussed patient's clinical history labs including renal function BNP cardiac testing chest x-ray, and presentation with cardiology Dr.Agbor, recommendation is for 80 of IV Lasix and admission to the hospital with plans to be seen by advanced heart failure team and or cardiology  Patient's presentation is most consistent with acute complicated illness / injury requiring diagnostic workup.   The patient is on the cardiac monitor to evaluate for evidence of arrhythmia and/or significant heart rate changes.   Discussed with patient, patient comfortable plan to receive IV furosemide with admission in anticipation of consultation with cardiology  Consulted with and patient accepted to hospital service by the hospitalist Dr. Zenovia Jordan     FINAL CLINICAL IMPRESSION(S) / ED DIAGNOSES   Final diagnoses:  Acute on chronic congestive heart failure, unspecified  heart failure type (HCC)     Rx / DC Orders   ED Discharge Orders     None        Note:  This document was prepared using Dragon voice recognition software and may include unintentional dictation errors.   Sharyn Creamer, MD 02/20/23 1024

## 2023-02-19 NOTE — Hospital Course (Addendum)
[name] is a 62 year old male with history of heart failure reduced ejection fraction, hyperlipidemia, hypertension, neuropathy, insulin-dependent diabetes mellitus 2, who presents to the ED from home for chief concerns of shortness of breath for 2 days.  Vitals in the ED showed temperature of 97.6, respiration rate 18, heart rate of 57, blood pressure 152/73, SpO2 94% room air.  Serum sodium is 136, potassium 5.4, chloride 110, bicarb 20, BUN of 83, serum creatinine of 3.83, eGFR 17, nonfasting blood glucose 133, WBC 5.2, hemoglobin 10.7, platelets of 172.  BNP was elevated 1502.1. High sensitivity troponin was 17.  ED treatment: Furosemide 80 mg IV one-time dose.

## 2023-02-19 NOTE — Assessment & Plan Note (Addendum)
Present on admission Patient is currently on doxycycline 100 mg p.o. twice daily, patient endorses that he took his a.m. dosing prior to ED presentation Resumed home doxycycline 100 mg p.o. twice daily, 5 more days to complete 7 day course

## 2023-02-19 NOTE — H&P (Addendum)
History and Physical   [name]:[id number] DOB: 1960-11-16 DOA: 02/19/2023  PCP: Smitty Cords, DO  Outpatient Specialists: Dr. Kirke Corin, [facility] cardiology Patient coming from: home   I have personally briefly reviewed patient's old medical records in [facility] EMR.  Chief Concern: shortness of breath  HPI: [name] is a 62 year old male with history of heart failure reduced ejection fraction, hyperlipidemia, hypertension, neuropathy, insulin-dependent diabetes mellitus 2, who presents to the ED from home for chief concerns of shortness of breath for 2 days.  Vitals in the ED showed temperature of 97.6, respiration rate 18, heart rate of 57, blood pressure 152/73, SpO2 94% room air.  Serum sodium is 136, potassium 5.4, chloride 110, bicarb 20, BUN of 83, serum creatinine of 3.83, eGFR 17, nonfasting blood glucose 133, WBC 5.2, hemoglobin 10.7, platelets of 172.  BNP was elevated 1502.1. High sensitivity troponin was 17.  ED treatment: Furosemide 80 mg IV one-time dose. --------------------------- At bedside, patient was able to tell me his name, age, location, current calendar year.  He was able to identify his spouse, Lupita Leash at bedside.  He reports that he has been feeling short of breath over the last 2 to 3 days.  He reports that he has had swelling over his lower extremity and that his stomach feels tight.  He does not know if he has gained weight for certain.  He denies trauma to his person.  He denies chest pain, nausea, vomiting, dysuria, hematuria, dysphagia, diarrhea, blood in his stool, syncope, loss of consciousness.  He reports that he does consume salt without restriction in his food.  Patient enjoys eating fried pork chops, steak, tomato sandwiches with mayonnaise and salt in it, etc.  Patient and spouse at bedside states that he drinks a lot of water.  Approximate daily fluid intake:  Water: 4-5 bottles of 16 ounce (64z - 80oz) Juice: 2  cups of OJ (8-16 oz) Coffee: 1 cup Tea: Once in a while Soda: None Beer: Infrequently, he has not consumed any beer, alcohol in 2 to 3 weeks  Social history: He lives at with his wife.  He denies tobacco and recreational drug use.  He infrequently consumes alcohol, last alcoholic drink was about 2 to 3 weeks ago.  He is retired and formerly was in Conservation officer, historic buildings.  ROS: Constitutional: no weight change, no fever ENT/Mouth: no sore throat, no rhinorrhea Eyes: no eye pain, no vision changes Cardiovascular: no chest pain, + dyspnea,  + lower extremities edema, no palpitations Respiratory: no cough, no sputum, no wheezing Gastrointestinal: no nausea, no vomiting, no diarrhea, no constipation Genitourinary: no urinary incontinence, no dysuria, no hematuria Musculoskeletal: no arthralgias, no myalgias Skin: no skin lesions, no pruritus, Neuro: no weakness, no loss of consciousness, no syncope Psych: no anxiety, no depression, no decrease appetite Heme/Lymph: no bruising, no bleeding  ED Course: Discussed with emergency medicine provider, patient requiring hospitalization for chief concerns of heart failure exacerbation.  Assessment/Plan  Principal Problem:   Acute on chronic heart failure (HCC) Active Problems:   Acute on chronic systolic congestive heart failure (HCC)   DM type 2 with diabetic peripheral neuropathy (HCC)   Benign hypertension with CKD (chronic kidney disease) stage IV (HCC)   Chronic combined systolic (congestive) and diastolic (congestive) heart failure (HCC)   Amputation of little toe (HCC)   Obesity (BMI 30.0-34.9)   Hyperlipidemia   Alcohol abuse   Essential hypertension   CKD (chronic kidney disease) stage 4, GFR  15-29 ml/min (HCC)   AKI (acute kidney injury) (HCC)   Right foot infection   Assessment and Plan:  * Acute on chronic heart failure (HCC) Suspect secondary to no diet restriction including increase in consumption of salt containing products  and excessive fluid intake Counseled patient regarding fluid intake restriction at bedside Strict I's and O's Status post furosemide 80 mg IV one-time dose per EDP EDP consulted cardiology and per EDP, Dr. Myriam Forehand will see the patient, per cardiology, repeat echo ordered Furosemide 40 mg IV twice daily, 2 doses ordered for 8/7  Right foot infection Present on admission Patient is currently on doxycycline 100 mg p.o. twice daily, patient endorses that he took his a.m. dosing prior to ED presentation Resumed home doxycycline 100 mg p.o. twice daily, 5 more days to complete 7 day course  AKI (acute kidney injury) (HCC) On CKD 3B/4, suspect cardiorenal Strict I's and O's Treat per heart failure exacerbation, status post furosemide 80 mg IV one-time dose per ED doctor Avoid nephrotoxic agents Recheck BMP in a.m.  CKD (chronic kidney disease) stage 4, GFR 15-29 ml/min (HCC) Avoid nephrotoxic agents Based on renal function panel on 7/3 and CMP on 7/18  Essential hypertension Home Coreg 6.25 twice daily hydralazine 100 mg 3 times daily, Imdur p.o. twice daily resumed on admission Hydralazine 5 mg IV every 6 hours as needed for SBP greater than 135, 4 days order  Chronic combined systolic (congestive) and diastolic (congestive) heart failure (HCC) Patient is not on ACE/ARB/Entresto due to CKD  DM type 2 with diabetic peripheral neuropathy [facility]) Patient had last A1c was 8.6 on 01/30/23 Insulin SSI with issues coverage ordered  Chart reviewed.   Last echo was done on 08/08/2022: Estimated ejection fraction was 55 to 60%, grade 2 diastolic dysfunction.  DVT prophylaxis: heparin 5000 units subcutaneous q8h Code Status: Full code Diet: Heart healthy Family Communication: Updated spouse, Lupita Leash at bedside with patient's permission Disposition Plan: pending clinical course, pending cardiology evaluation Consults called: cardiology, Dr. Myriam Forehand per EDP Admission status: telemetry cardiac,  inpatient  Past Medical History:  Diagnosis Date   Ankle pain    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 10/2016 Echo: EF 25-30%, diff HK; b. 01/2017 Echo:  EF 40-45%, GrI DD; c. 08/2017 Echo: EF 40-45%, GrI DD; d. 08/2020 Echo: EF 45-50%, glob HK. Mod asymm LVH, GrI DD, Nl RV size/fxn; e. 08/2020 PYP: equivocal for ATTR cardiac amyloid.   CKD (chronic kidney disease), stage III - IV (HCC)    Coronary artery disease    a. 10/2016 Cath/PCI: LAD 8m (3.5x15 Xience Alpine DES). No other obstructive disease.   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 10/2016 Echo: EF 25-30%, diff HK; b. 01/2017 Echo:  EF 40-45%, GrI DD; c. 08/2017 Echo: EF 40-45%, GrI DD; d. 08/2020 Echo: EF 45-50%, glob HK. Mod asymm LVH, GrI DD, Nl RV size/fxn.   Pain in both feet    Past Surgical History:  Procedure Laterality Date   CARDIAC CATHETERIZATION     CORONARY STENT INTERVENTION N/A 10/21/2016   Procedure: Coronary Stent Intervention;  Surgeon: Iran Ouch, MD;  Location: [facility] INVASIVE CV LAB;  Service: Cardiovascular;  Laterality: N/A;   LEFT HEART CATH AND CORONARY ANGIOGRAPHY N/A 10/21/2016   Procedure: Left Heart Cath and Coronary Angiography;  Surgeon: Iran Ouch, MD;  Location: [facility] INVASIVE CV LAB;  Service: Cardiovascular;  Laterality: N/A;   TIBIA FRACTURE SURGERY Right 2002   Social History:  reports that he has never smoked. He has never used smokeless tobacco. He reports current alcohol use. He reports that he does not use drugs.  Allergies  Allergen Reactions   Sulfamethoxazole-Trimethoprim Other (See Comments)    AKI    Amlodipine Other (See Comments)    Edema   Lisinopril     Hyperkalemia    Jardiance [Empagliflozin] Rash   Terbinafine And Related Rash   Family History  Problem Relation Age of Onset   Hypertension Mother    Heart disease  Maternal Grandfather    Family history: Family history reviewed and not pertinent.  Prior to Admission medications   Medication Sig Start Date End Date Taking? Authorizing Provider  ACCU-CHEK FASTCLIX LANCETS MISC Check sugar up to 3 x daily as instructed 08/26/18   Smitty Cords, DO  ACCU-CHEK GUIDE test strip Check blood sugar up to 3 times daily as advised 08/26/18   Karamalegos, Netta Neat, DO  acetaminophen (TYLENOL 8 HOUR) 650 MG CR tablet Take 1 tablet (650 mg total) by mouth every 8 (eight) hours as needed for pain. 08/28/15   Loura Pardon, NP  aspirin EC 81 MG tablet Take 81 mg by mouth.  09/22/07   [provider]  atorvastatin (LIPITOR) 40 MG tablet TAKE 1 TABLET BY MOUTH EVERY DAY 02/10/23   Iran Ouch, MD  B-D ULTRAFINE III SHORT PEN 31G X 8 MM MISC USE TO INJECT INSULIN NIGHTLY 01/14/23   Karamalegos, Alexander J, DO  carvedilol (COREG) 25 MG tablet TAKE 1 TABLET (25 MG TOTAL) BY MOUTH TWICE A DAY WITH MEALS 10/11/22   Iran Ouch, MD  doxazosin (CARDURA) 1 MG tablet Take 2 tablets (2 mg total) by mouth at bedtime. 08/12/22   Bensimhon, Bevelyn Buckles, MD  doxycycline (VIBRAMYCIN) 100 MG capsule Take 1 capsule (100 mg total) by mouth 2 (two) times daily. 02/17/23   Becky Augusta, NP  furosemide (LASIX) 40 MG tablet Take 1 tablet (40 mg total) by mouth daily. 06/20/22   Iran Ouch, MD  gabapentin (NEURONTIN) 400 MG capsule Take 1 capsule (400 mg total) by mouth 2 (two) times daily. 01/29/23   Karamalegos, Netta Neat, DO  hydrALAZINE (APRESOLINE) 100 MG tablet TAKE 1 TABLET BY MOUTH THREE TIMES A DAY 10/11/22   Iran Ouch, MD  isosorbide mononitrate (IMDUR) 60 MG 24 hr tablet TAKE 1 TABLET BY MOUTH 2 TIMES DAILY. 02/10/23   Iran Ouch, MD  Lancets Misc. MISC Use  Brand compatable to insurance and monitor to check blood sugar up to 3 times daily. ICD10 E11.9 09/28/21   Smitty Cords, DO  LANTUS SOLOSTAR 100 UNIT/ML Solostar Pen INJECT 40  UNITS INTO THE SKIN AT BEDTIME. 02/05/23   Karamalegos, Netta Neat, DO  LOSARTAN POTASSIUM PO Take 25 mg by mouth daily.    [provider]   Physical Exam: Vitals:   02/19/23 1151 02/19/23 1254  BP:  (!) 152/73  Pulse:  (!) 57  Resp:  18  Temp:  97.6 F (36.4 C)  TempSrc:  Oral  SpO2:  94%  Weight: 97.1 kg   Height: 5\' 8"  (1.727 m)    Constitutional: appears age-appropriate, NAD, calm Eyes: PERRL, lids and conjunctivae normal, + xanthelasmata ENMT: Mucous membranes are moist. Posterior pharynx clear of any exudate or lesions. Age-appropriate dentition. Hearing appropriate Neck: normal,  supple, no masses, no thyromegaly Respiratory: clear to auscultation bilaterally, no wheezing, no crackles. Normal respiratory effort. No accessory muscle use.  Cardiovascular: Regular rate and rhythm, no murmurs / rubs / gallops.  Mild bilateral lower extremity trace edema. 2+ pedal pulses. No carotid bruits.  Abdomen: + obese abdomen, no tenderness, no masses palpated, no hepatosplenomegaly. Bowel sounds positive.  Musculoskeletal: no clubbing / cyanosis. No joint deformity upper and lower extremities. Good ROM, no contractures, no atrophy. Normal muscle tone.  Skin: no rashes, lesions, ulcers. No induration.  Right lower extremity skin rash/petechiae in the anterior leg, continue to monitor.  + Right foot wound, appears well-healed     Neurologic: Sensation intact. Strength 5/5 in all 4.  Psychiatric: Normal judgment and insight. Alert and oriented x 3. Normal mood.   EKG: independently reviewed, showing sinus bradycardia with rate of 58, QTc 492  Chest x-ray on Admission: I personally reviewed and I agree with radiologist reading as below.  DG Chest 2 View  Result Date: 02/19/2023 CLINICAL DATA:  Several day history of shortness of breath EXAM: CHEST - 2 VIEW COMPARISON:  Chest radiograph dated 10/18/2016 FINDINGS: Normal lung volumes. Mild bilateral interstitial opacities. Trace  bilateral pleural effusions. No pneumothorax. Similar mildly enlarged cardiomediastinal silhouette. No acute osseous abnormality. IMPRESSION: 1. Mild pulmonary edema. 2. Trace bilateral pleural effusions. Electronically Signed   By: Agustin Cree M.D.   On: 02/19/2023 13:39    Labs on Admission: I have personally reviewed following labs  CBC: Recent Labs  Lab 02/19/23 1155  WBC 5.6  HGB 10.7*  HCT 32.3*  MCV 103.5*  PLT 172   Basic Metabolic Panel: Recent Labs  Lab 02/19/23 1155  NA 136  K 5.4*  CL 110  CO2 20*  GLUCOSE 133*  BUN 83*  CREATININE 3.83*  CALCIUM 8.1*   GFR: Estimated Creatinine Clearance: 22.3 mL/min (A) (by C-G formula based on SCr of 3.83 mg/dL (H)).  Liver Function Tests: Recent Labs  Lab 02/19/23 1155  AST 21  ALT 21  ALKPHOS 50  BILITOT 0.6  PROT 6.7  ALBUMIN 3.3*   Urine analysis:    Component Value Date/Time   COLORURINE YELLOW (A) 07/16/2018 0821   APPEARANCEUR CLEAR (A) 07/16/2018 0821   LABSPEC 1.018 07/16/2018 0821   PHURINE 6.0 07/16/2018 0821   GLUCOSEU >=500 (A) 07/16/2018 0821   HGBUR NEGATIVE 07/16/2018 0821   BILIRUBINUR NEGATIVE 07/16/2018 0821   KETONESUR NEGATIVE 07/16/2018 0821   PROTEINUR 100 (A) 07/16/2018 0821   NITRITE NEGATIVE 07/16/2018 0821   LEUKOCYTESUR TRACE (A) 07/16/2018 0821   This document was prepared using Dragon Voice Recognition software and may include unintentional dictation errors.  Dr. Sedalia Muta [facility]  If 7PM-7AM, please contact overnight-coverage provider If 7AM-7PM, please contact day attending provider [email or web address]  02/19/2023, 4:57 PM

## 2023-02-19 NOTE — ED Triage Notes (Signed)
Pt sts that he has been SOB for the last couple of days. Pt sts that he also has a wound on the bottom of his right foot for that he was seen at the [facility] for and is to see wound care. Pt sts that his abd is getting very swollen as well as his legs.

## 2023-02-19 NOTE — ED Notes (Addendum)
EKG delay due to technical difficulties.

## 2023-02-20 DIAGNOSIS — I5033 Acute on chronic diastolic (congestive) heart failure: Secondary | ICD-10-CM | POA: Diagnosis not present

## 2023-02-20 DIAGNOSIS — N179 Acute kidney failure, unspecified: Secondary | ICD-10-CM | POA: Diagnosis not present

## 2023-02-20 DIAGNOSIS — N189 Chronic kidney disease, unspecified: Secondary | ICD-10-CM | POA: Diagnosis not present

## 2023-02-20 LAB — CBG MONITORING, ED
Glucose-Capillary: 63 mg/dL — ABNORMAL LOW (ref 70–99)
Glucose-Capillary: 104 mg/dL — ABNORMAL HIGH (ref 70–99)
Glucose-Capillary: 211 mg/dL — ABNORMAL HIGH (ref 70–99)

## 2023-02-20 LAB — GLUCOSE, CAPILLARY
Glucose-Capillary: 163 mg/dL — ABNORMAL HIGH (ref 70–99)
Glucose-Capillary: 156 mg/dL — ABNORMAL HIGH (ref 70–99)

## 2023-02-20 MED ORDER — FUROSEMIDE 10 MG/ML IJ SOLN
40.0000 mg | Freq: Two times a day (BID) | INTRAMUSCULAR | Status: DC
  Administered 2023-02-20 – 2023-02-21 (×2): 40 mg via INTRAVENOUS
  Filled 2023-02-20 (×2): qty 4

## 2023-02-20 MED ORDER — INSULIN ASPART 100 UNIT/ML IJ SOLN
0.0000 [IU] | Freq: Three times a day (TID) | INTRAMUSCULAR | Status: DC
  Administered 2023-02-20 – 2023-02-22 (×4): 2 [IU] via SUBCUTANEOUS
  Administered 2023-02-22: 3 [IU] via SUBCUTANEOUS
  Administered 2023-02-22: 1 [IU] via SUBCUTANEOUS
  Administered 2023-02-23: 3 [IU] via SUBCUTANEOUS
  Administered 2023-02-23: 2 [IU] via SUBCUTANEOUS
  Filled 2023-02-20 (×8): qty 1

## 2023-02-20 MED ORDER — FUROSEMIDE 10 MG/ML IJ SOLN: 40.0000 mg | Freq: Two times a day (BID) | INTRAMUSCULAR | Status: DC

## 2023-02-20 NOTE — Progress Notes (Signed)
Progress Note   Patient: [name] TDD:[id number] DOB: [birth year] DOA: 02/19/2023     1 DOS: the patient was seen and examined on 02/20/2023   Brief hospital course: HPI: [name] is a 62 year old male with history of heart failure reduced ejection fraction, hyperlipidemia, hypertension, neuropathy, insulin-dependent diabetes mellitus 2, who presents to the ED from home for chief concerns of shortness of breath for 2 days.   Vitals in the ED showed temperature of 97.6, respiration rate 18, heart rate of 57, blood pressure 152/73, SpO2 94% room air.   Serum sodium is 136, potassium 5.4, chloride 110, bicarb 20, BUN of 83, serum creatinine of 3.83, eGFR 17, nonfasting blood glucose 133, WBC 5.2, hemoglobin 10.7, platelets of 172.   BNP was elevated 1502.1. High sensitivity troponin was 17.   ED treatment: Furosemide 80 mg IV one-time dose. --------------------------- At bedside, patient was able to tell me his name, age, location, current calendar year.  He was able to identify his spouse, [name] at bedside.   He reports that he has been feeling short of breath over the last 2 to 3 days.  He reports that he has had swelling over his lower extremity and that his stomach feels tight.  He does not know if he has gained weight for certain.   He denies trauma to his person.  He denies chest pain, nausea, vomiting, dysuria, hematuria, dysphagia, diarrhea, blood in his stool, syncope, loss of consciousness.   He reports that he does consume salt without restriction in his food.  Patient enjoys eating fried pork chops, steak, tomato sandwiches with mayonnaise and salt in it, etc.   Patient and spouse at bedside states that he drinks a lot of water.   Approximate daily fluid intake:   Water: 4-5 bottles of 16 ounce (64z - 80oz) Juice: 2 cups of OJ (8-16 oz) Coffee: 1 cup Tea: Once in a while Soda: None Beer: Infrequently, he has not consumed any beer, alcohol in 2 to 3 weeks    Social history: He lives at with his wife.  He denies tobacco and recreational drug use.  He infrequently consumes alcohol, last alcoholic drink was about 2 to 3 weeks ago.  He is retired and formerly was in Conservation officer, historic buildings.    Assessment and Plan:   * Acute on chronic diastolic dysfunction heart failure (HCC) Last echo was done on 08/08/2022: Estimated ejection fraction was 55 to 60%, grade 2 diastolic dysfunction. Most likely secondary to under dosed diuretic therapy, patient was on 10 mg of Lasix daily instead of 40 mg daily Presents for evaluation of worsening shortness of breath associated orthopnea and lower extremity swelling Continue Lasix 40 mg IV twice daily,  hydralazine 100 mg 3 times a day, carvedilol 6.25 mg twice daily and Imdur 60 mg daily Patient not on an ACE or ARB due to stage IV chronic kidney disease     Acute hypoxic respiratory failure Secondary to acute diastolic dysfunction CHF Patient noted to have room air pulse oximetry in the low 80s and is currently on 2 L with improvement in his pulse oximetry to greater than 92%. Will attempt to wean off oxygen once acute illness improves or resolves    Right foot infection Present on admission Patient is currently on doxycycline 100 mg p.o. twice daily,  Continue home doxycycline 100 mg p.o. twice daily, 5 more days to complete 7 day course    AKI (acute kidney injury) (HCC) On CKD 4,  suspect cardiorenal Strict I's and O's Will consult nephrology if worsening renal function while on diuretics    Diabetes mellitus with complications of CKD (chronic kidney disease) stage 4, GFR 15-29 ml/min (HCC) Avoid nephrotoxic agents Based on renal function panel on 7/3 and CMP on 7/18   Essential hypertension Improved blood pressure control Continue carvedilol, hydralazine and Imdur     DM type 2 with diabetic peripheral neuropathy (HCC) Hypoglycemia Patient had an episode of hypoglycemia with blood sugar of 63  this am Glycemic control improved following administration of orange juice Will discontinue nighttime insulin Continue consistent carbohydrate diet Change glycemic control to renal (more sensitive)         Subjective: Patient is seen and examined at the bedside.  No new complaints  Physical Exam: Vitals:   02/20/23 1030 02/20/23 1100 02/20/23 1115 02/20/23 1200  BP: (!) 153/82 (!) 153/87  137/75  Pulse: (!) 59 (!) 58  (!) 57  Resp: 19 20  17   Temp:   98.1 F (36.7 C)   TempSrc:   Oral   SpO2:  94%  98%  Weight:      Height:        Constitutional: appears age-appropriate, NAD, calm Eyes: PERRL, lids and conjunctivae normal, + xanthelasmata ENMT: Mucous membranes are moist. Posterior pharynx clear of any exudate or lesions. Age-appropriate dentition. Hearing appropriate Neck: normal, supple, no masses, no thyromegaly Respiratory: clear to auscultation bilaterally, no wheezing, no crackles. Normal respiratory effort. No accessory muscle use.  Cardiovascular: Regular rate and rhythm, no murmurs / rubs / gallops.  Mild bilateral lower extremity trace edema. 2+ pedal pulses. No carotid bruits.  Abdomen: + obese abdomen, no tenderness, no masses palpated, no hepatosplenomegaly. Bowel sounds positive.  Musculoskeletal: no clubbing / cyanosis. No joint deformity upper and lower extremities. Good ROM, no contractures, no atrophy. Normal muscle tone.  Skin: no rashes, lesions, ulcers. No induration.  Right lower extremity skin rash/petechiae in the anterior leg, continue to monitor.   + Right foot wound, appears well-healed       Neurologic: Sensation intact. Strength 5/5 in all 4.  Psychiatric: Normal judgment and insight. Alert and oriented x 3. Normal mood.     Data Reviewed: Labs reviewed. Creatinine 3.85 above her baseline of 2.77 There are no new results to review at this time.  Family Communication: Plan of care discussed with patient at the bedside.  All questions and  concerns have been addressed.  Disposition: Status is: Inpatient Remains inpatient appropriate because: On IV diuresis for acute CHF  Planned Discharge Destination: Home    Time spent: 35 minutes  Author: Lucile Shutters, MD 02/20/2023 12:55 PM  For on call review [email or web address].

## 2023-02-20 NOTE — Progress Notes (Signed)
Cardiology Progress Note   Patient Name: [name] Date of Encounter: 02/20/2023  Primary Cardiologist: Lorine Bears, MD  Subjective   Breathing and swelling improved, though not quite back to baseline.  Notes good urine output, though no intake recorded.  No chest pain/palpitations.  Inpatient Medications    Scheduled Meds:  aspirin EC  81 mg Oral Daily   atorvastatin  40 mg Oral QHS   carvedilol  6.25 mg Oral BID WC   doxycycline  100 mg Oral BID   furosemide  40 mg Intravenous BID   gabapentin  400 mg Oral BID   heparin  5,000 Units Subcutaneous Q8H   hydrALAZINE  100 mg Oral TID   insulin aspart  0-9 Units Subcutaneous TID WC   isosorbide mononitrate  60 mg Oral BID   Continuous Infusions:  PRN Meds: acetaminophen **OR** acetaminophen, morphine injection, ondansetron **OR** ondansetron (ZOFRAN) IV, senna-docusate   Vital Signs    Vitals:   02/20/23 1030 02/20/23 1100 02/20/23 1115 02/20/23 1200  BP: (!) 153/82 (!) 153/87  137/75  Pulse: (!) 59 (!) 58  (!) 57  Resp: 19 20  17   Temp:   98.1 F (36.7 C)   TempSrc:   Oral   SpO2:  94%  98%  Weight:      Height:        Intake/Output Summary (Last 24 hours) at 02/20/2023 1325 Last data filed at 02/20/2023 0857 Gross per 24 hour  Intake --  Output 2100 ml  Net -2100 ml   Filed Weights   02/19/23 1151  Weight: 97.1 kg    Physical Exam   GEN: Well nourished, well developed, in no acute distress.  HEENT: Grossly normal.  Neck: Supple, moderately elevated JVD.  No bruits/masses. Cardiac: RRR, no murmurs, rubs, or gallops. No clubbing, cyanosis, edema.  Radials 2+, DP/PT 2+ and equal bilaterally.  Respiratory:  Respirations regular and unlabored, bibasilar crackles. GI: semi-firm, nontender, BS + x 4. MS: no deformity or atrophy. Skin: warm and dry, no rash. Neuro:  Strength and sensation are intact. Psych: AAOx3.  Normal affect.  Labs    Chemistry Recent Labs  Lab 02/19/23 1155  02/20/23 0540  NA 136 138  K 5.4* 4.7  CL 110 110  CO2 20* 20*  GLUCOSE 133* 64*  BUN 83* 78*  CREATININE 3.83* 3.85*  CALCIUM 8.1* 8.3*  PROT 6.7  --   ALBUMIN 3.3*  --   AST 21  --   ALT 21  --   ALKPHOS 50  --   BILITOT 0.6  --   GFRNONAA 17* 17*  ANIONGAP 6 8     Hematology Recent Labs  Lab 02/19/23 1155 02/20/23 0540  WBC 5.6 4.4  RBC 3.12* 2.88*  HGB 10.7* 9.9*  HCT 32.3* 28.7*  MCV 103.5* 99.7  MCH 34.3* 34.4*  MCHC 33.1 34.5  RDW 13.1 13.1  PLT 172 173    Cardiac Enzymes  Recent Labs  Lab 02/19/23 1155 02/19/23 1428  TROPONINIHS 17 18*      BNP    Component Value Date/Time   BNP 1,502.1 (H) 02/19/2023 1155   BNP 112 (H) 08/22/2017 0908   Lipids  Lab Results  Component Value Date   CHOL 132 01/30/2023   HDL 38 (L) 01/30/2023   LDLCALC 71 01/30/2023   LDLDIRECT 74 03/16/2021   TRIG 143 01/30/2023   CHOLHDL 3.5 01/30/2023    HbA1c  Lab Results  Component Value Date  HGBA1C 8.6 (H) 01/30/2023    Radiology    DG Chest 2 View  Result Date: 02/19/2023 CLINICAL DATA:  Several day history of shortness of breath EXAM: CHEST - 2 VIEW COMPARISON:  Chest radiograph dated 10/18/2016 FINDINGS: Normal lung volumes. Mild bilateral interstitial opacities. Trace bilateral pleural effusions. No pneumothorax. Similar mildly enlarged cardiomediastinal silhouette. No acute osseous abnormality. IMPRESSION: 1. Mild pulmonary edema. 2. Trace bilateral pleural effusions. Electronically Signed   By: Agustin Cree M.D.   On: 02/19/2023 13:39    Telemetry    Sinus brady, 50's, rare PVC - Personally Reviewed  Cardiac Studies   2D Echocardiogram - pending  Patient Profile     62 y.o. male w/a  h/o CAD, chronic HFrEF, ICM, HTN, HL, CKD IV, DMII, PAD, and etoh abuse, who was admitted 8/7 w/ progressive volume overload.  Assessment & Plan    1.  Acute on chronic HFmrEF/ICM:  EF prev 45-50 w/ improvement to 55-60% by echo in 07/2022.  PYP scan not felt to  represent amyloid. Admitted w/ recurrent volume overload.  Notes good response to IV lasix.  I/O inaccurate.  No weights.  Remains volume overloaded.  Cont IV diuresis today.  Rec nephrology eval in setting of CKD IV w/ sl worsening of creat this AM @ 3.85.  Avoiding nephrotoxic agents.  Cont ? blocker, hydral/nitrate.  2.  CAD:  s/p DES  LAD in 2018.  No chest pain.  HsTrop 18.  Cont asa, statin, ? blocker,   3.  Primary HTN:  pressures trending 130's to 150's.  Sl better after AM meds.  Follow.  On max dose hydral.  Bradycardia limits any further titration of carvedilol.  Could consider amlodipine if necessary.  4.  HL:  LDL 71.  Cont statin rx.  5.  CKD IV:  Creat up sl this AM.  Notes that he saw nephrology once as an outpt.  Rec nephrology eval as it is unclear to what extent worsening renal fxn is playing a role in his volume excess and renal failure has limited titration of outpt diuretics previously.  Signed, Nicolasa Ducking, NP  02/20/2023, 1:25 PM    For questions or updates, please contact   Please consult [email or web address] for contact info under Cardiology/STEMI.

## 2023-02-20 NOTE — ED Notes (Signed)
Patient oxygen level keeps dipping down into 80s since he has fallen asleep. Messaged MD and got okay to put patient on 2L [place] while sleeping. Patient into mid to high 90s with O2.

## 2023-02-20 NOTE — ED Notes (Signed)
Pt given 2 orange juice

## 2023-02-20 NOTE — ED Notes (Signed)
Pt updated on moving to his room. Pt denies any needs at this time

## 2023-02-20 NOTE — Progress Notes (Signed)
Consult to HF Navigation Team Placed. Unfortunately, this patient does not meet criteria due to following with advanced HF Team outpatient. Will sign off and follow alongside Advanced Heart Failure Team. Please feel free to reach out with any questions or medication assistance needs.   Thank you for involving the HF Navigation Team in this patient's care.   Enos Fling, PharmD, BCPS Clinical Pharmacist 02/20/2023 12:50 PM

## 2023-02-20 NOTE — Plan of Care (Signed)
  Problem: Education: Goal: Ability to demonstrate management of disease process will improve Outcome: Progressing Goal: Ability to verbalize understanding of medication therapies will improve Outcome: Progressing Goal: Individualized Educational Video(s) Outcome: Progressing   Problem: Activity: Goal: Capacity to carry out activities will improve Outcome: Progressing   Problem: Cardiac: Goal: Ability to achieve and maintain adequate cardiopulmonary perfusion will improve Outcome: Progressing   Problem: Education: Goal: Individualized Educational Video(s) Outcome: Progressing   Problem: Coping: Goal: Ability to adjust to condition or change in health will improve Outcome: Progressing   Problem: Fluid Volume: Goal: Ability to maintain a balanced intake and output will improve Outcome: Progressing   Problem: Health Behavior/Discharge Planning: Goal: Ability to identify and utilize available resources and services will improve Outcome: Progressing Goal: Ability to manage health-related needs will improve Outcome: Progressing   Problem: Nutritional: Goal: Maintenance of adequate nutrition will improve Outcome: Progressing Goal: Progress toward achieving an optimal weight will improve Outcome: Progressing   Problem: Metabolic: Goal: Ability to maintain appropriate glucose levels will improve Outcome: Progressing   Problem: Skin Integrity: Goal: Risk for impaired skin integrity will decrease Outcome: Progressing   Problem: Tissue Perfusion: Goal: Adequacy of tissue perfusion will improve Outcome: Progressing   Problem: Education: Goal: Knowledge of General Education information will improve Description: Including pain rating scale, medication(s)/side effects and non-pharmacologic comfort measures Outcome: Progressing   Problem: Health Behavior/Discharge Planning: Goal: Ability to manage health-related needs will improve Outcome: Progressing   Problem: Clinical  Measurements: Goal: Ability to maintain clinical measurements within normal limits will improve Outcome: Progressing Goal: Will remain free from infection Outcome: Progressing Goal: Diagnostic test results will improve Outcome: Progressing Goal: Respiratory complications will improve Outcome: Progressing Goal: Cardiovascular complication will be avoided Outcome: Progressing   Problem: Activity: Goal: Risk for activity intolerance will decrease Outcome: Progressing   Problem: Nutrition: Goal: Adequate nutrition will be maintained Outcome: Progressing   Problem: Coping: Goal: Level of anxiety will decrease Outcome: Progressing   Problem: Skin Integrity: Goal: Risk for impaired skin integrity will decrease Outcome: Progressing   Problem: Safety: Goal: Ability to remain free from injury will improve Outcome: Progressing   Problem: Pain Managment: Goal: General experience of comfort will improve Outcome: Progressing

## 2023-02-21 DIAGNOSIS — N179 Acute kidney failure, unspecified: Secondary | ICD-10-CM | POA: Diagnosis not present

## 2023-02-21 DIAGNOSIS — N189 Chronic kidney disease, unspecified: Secondary | ICD-10-CM | POA: Diagnosis not present

## 2023-02-21 DIAGNOSIS — I5033 Acute on chronic diastolic (congestive) heart failure: Secondary | ICD-10-CM | POA: Diagnosis not present

## 2023-02-21 LAB — GLUCOSE, CAPILLARY
Glucose-Capillary: 120 mg/dL — ABNORMAL HIGH (ref 70–99)
Glucose-Capillary: 151 mg/dL — ABNORMAL HIGH (ref 70–99)
Glucose-Capillary: 170 mg/dL — ABNORMAL HIGH (ref 70–99)
Glucose-Capillary: 166 mg/dL — ABNORMAL HIGH (ref 70–99)

## 2023-02-21 LAB — MAGNESIUM: Magnesium: 2.4 mg/dL (ref 1.7–2.4)

## 2023-02-21 MED ORDER — CLONIDINE HCL 0.1 MG PO TABS
0.1000 mg | ORAL_TABLET | Freq: Two times a day (BID) | ORAL | Status: DC
  Administered 2023-02-21 – 2023-02-23 (×5): 0.1 mg via ORAL
  Filled 2023-02-21 (×5): qty 1

## 2023-02-21 MED ORDER — FUROSEMIDE 10 MG/ML IJ SOLN
40.0000 mg | Freq: Two times a day (BID) | INTRAMUSCULAR | Status: DC
  Administered 2023-02-21 – 2023-02-23 (×3): 40 mg via INTRAVENOUS
  Filled 2023-02-21 (×4): qty 4

## 2023-02-21 MED ORDER — HYDRALAZINE HCL 20 MG/ML IJ SOLN: 10.0000 mg | Freq: Four times a day (QID) | INTRAMUSCULAR | Status: DC | PRN

## 2023-02-21 NOTE — Plan of Care (Signed)

## 2023-02-21 NOTE — Progress Notes (Signed)
Cardiology Progress Note   Patient Name: [name] Date of Encounter: 02/21/2023  Primary Cardiologist: Lorine Bears, MD  Subjective   Feels that breathing and swelling are both close to baseline.  Again notes good urine output.  Creatinine higher this morning at 4.08.  Inpatient Medications    Scheduled Meds:  aspirin EC  81 mg Oral Daily   atorvastatin  40 mg Oral QHS   carvedilol  6.25 mg Oral BID WC   doxycycline  100 mg Oral BID   furosemide  40 mg Intravenous BID   gabapentin  400 mg Oral BID   heparin  5,000 Units Subcutaneous Q8H   hydrALAZINE  100 mg Oral TID   insulin aspart  0-9 Units Subcutaneous TID WC   isosorbide mononitrate  60 mg Oral BID   Continuous Infusions:  PRN Meds: acetaminophen **OR** acetaminophen, morphine injection, ondansetron **OR** ondansetron (ZOFRAN) IV, senna-docusate   Vital Signs    Vitals:   02/20/23 2158 02/20/23 2318 02/21/23 0356 02/21/23 0753  BP: (!) 175/76   (!) 170/83  Pulse:  64 (!) 59 (!) 58  Resp:  16 20   Temp:  98.3 F (36.8 C) 98.2 F (36.8 C) 98.1 F (36.7 C)  TempSrc:  Oral Oral Oral  SpO2:  99% 96% 96%  Weight:      Height:        Intake/Output Summary (Last 24 hours) at 02/21/2023 1022 Last data filed at 02/21/2023 0806 Gross per 24 hour  Intake 120 ml  Output 2050 ml  Net -1930 ml   Filed Weights   02/19/23 1151  Weight: 97.1 kg    Physical Exam   GEN: Well nourished, well developed, in no acute distress.  HEENT: Grossly normal.  Neck: Supple, moderately elevated JVD w/ + HJR.  No carotid bruits or masses. Cardiac: RRR, 1/6 syst murmur @ apex, no rubs or gallops. No clubbing, cyanosis, 1+ bilat ankle edema above the sock line.  Radials 2+, DP/PT 2+ and equal bilaterally.  Respiratory:  Respirations regular and unlabored, clear to auscultation bilaterally. GI: obese, protuberatn, semi-firm, nontender, BS + x 4. MS: no deformity or atrophy. Skin: warm and dry, no rash. Neuro:  Strength  and sensation are intact. Psych: AAOx3.  Normal affect.  Labs    Chemistry Recent Labs  Lab 02/19/23 1155 02/20/23 0540 02/21/23 0348  NA 136 138 140  K 5.4* 4.7 4.7  CL 110 110 107  CO2 20* 20* 25  GLUCOSE 133* 64* 125*  BUN 83* 78* 83*  CREATININE 3.83* 3.85* 4.08*  CALCIUM 8.1* 8.3* 8.0*  PROT 6.7  --   --   ALBUMIN 3.3*  --   --   AST 21  --   --   ALT 21  --   --   ALKPHOS 50  --   --   BILITOT 0.6  --   --   GFRNONAA 17* 17* 16*  ANIONGAP 6 8 8      Hematology Recent Labs  Lab 02/19/23 1155 02/20/23 0540  WBC 5.6 4.4  RBC 3.12* 2.88*  HGB 10.7* 9.9*  HCT 32.3* 28.7*  MCV 103.5* 99.7  MCH 34.3* 34.4*  MCHC 33.1 34.5  RDW 13.1 13.1  PLT 172 173    Cardiac Enzymes  Recent Labs  Lab 02/19/23 1155 02/19/23 1428  TROPONINIHS 17 18*      BNP    Component Value Date/Time   BNP 1,502.1 (H) 02/19/2023 1155   BNP  112 (H) 08/22/2017 0908   Lipids  Lab Results  Component Value Date   CHOL 132 01/30/2023   HDL 38 (L) 01/30/2023   LDLCALC 71 01/30/2023   LDLDIRECT 74 03/16/2021   TRIG 143 01/30/2023   CHOLHDL 3.5 01/30/2023    HbA1c  Lab Results  Component Value Date   HGBA1C 8.6 (H) 01/30/2023    Radiology    DG Chest 2 View  Result Date: 02/19/2023 CLINICAL DATA:  Several day history of shortness of breath EXAM: CHEST - 2 VIEW COMPARISON:  Chest radiograph dated 10/18/2016 FINDINGS: Normal lung volumes. Mild bilateral interstitial opacities. Trace bilateral pleural effusions. No pneumothorax. Similar mildly enlarged cardiomediastinal silhouette. No acute osseous abnormality. IMPRESSION: 1. Mild pulmonary edema. 2. Trace bilateral pleural effusions. Electronically Signed   By: Agustin Cree M.D.   On: 02/19/2023 13:39    Telemetry    RSR, 60 - Personally Reviewed  Cardiac Studies   2D Echocardiogram 8.7.2024   1. Left ventricular ejection fraction, by estimation, is 50 to 55%. The  left ventricle has low normal function. The left ventricle  has no regional  wall motion abnormalities. There is mild left ventricular hypertrophy.  Left ventricular diastolic  parameters are consistent with Grade II diastolic dysfunction  (pseudonormalization). Elevated left atrial pressure.   2. Right ventricular systolic function is normal. The right ventricular  size is mildly enlarged. Tricuspid regurgitation signal is inadequate for  assessing PA pressure.   3. Left atrial size was mildly dilated.   4. The mitral valve is degenerative. Mild to moderate mitral valve  regurgitation. No evidence of mitral stenosis.   5. The aortic valve is tricuspid. Aortic valve regurgitation is not  visualized. No aortic stenosis is present.   6. There is borderline dilatation of the ascending aorta, measuring 38  mm.   7. The inferior vena cava is normal in size with greater than 50%  respiratory variability, suggesting right atrial pressure of 3 mmHg.  _____________   Patient Profile     62 y.o. male w/a h/o CAD, chronic HFrEF, ICM, HTN, HL, CKD IV, DMII, PAD, and etoh abuse, who was admitted 8/7 w/ progressive volume overload.   Assessment & Plan    1.  Acute on chronic heart failure with midrange ejection fraction/ischemic cardiomyopathy:  EF prev 45-50 w/ improvement to 55-60% by echo in 07/2022. PYP scan not felt to represent amyloid. Admitted w/ recurrent volume overload.  Continues to note good response to IV Lasix.  I/O incomplete, though good UO yesterday (1850).  BUN/Creat higher this AM @ 83/4.08.  No weights recorded (dry wt 96.3 on 6/17).  Still with JVD, some abdominal fullness, and 1+ ankle edema.  He received a dose of Lasix this morning but in the setting of worsening BUN and creatinine, up to 83 and 4.08 respectively, we placed his Lasix on hold.  Strongly recommend nephrology consult to assist with management of diuretic therapy.  2.  Coronary artery disease: Status post drug-eluting stent placement to the LAD in 2018.  No chest pain.   Troponin 18.  Continue aspirin, statin, and beta-blocker.  3.  Primary hypertension: Pressures remain elevated.  On max dose hydralazine.  Bradycardia will limit any further titration of beta-blocker.  Prior intolerance to amlodipine in the setting of lower extremity edema.  Will add low-dose clonidine and follow-up.  4.  Hyperlipidemia: LDL of 71.  Continue statin therapy.  5.  Acute on chronic stage IV kidney disease: BUN and creatinine higher  today at 83 and 4.08 respectively.  We have held diuretic therapy and recommend nephrology evaluation.  Signed, Nicolasa Ducking, NP  02/21/2023, 10:22 AM    For questions or updates, please contact   Please consult [email or web address] for contact info under Cardiology/STEMI.

## 2023-02-21 NOTE — Progress Notes (Signed)
Progress Note   Patient: [name]:[id number] DOB: 1961-05-13 DOA: 02/19/2023     2 DOS: the patient was seen and examined on 02/21/2023   Brief hospital course: HPI: [name] is a 62 year old male with history of heart failure reduced ejection fraction, hyperlipidemia, hypertension, neuropathy, insulin-dependent diabetes mellitus 2, who has been admitted to hospital for acute on chronic diastolic dysfunction CHF.    Assessment and Plan:  Acute on chronic diastolic CHF (HCC) Reviewed echo 02/19/23: LVEF 50 to 55%, has no regional  wall motion abnormalities.Mild LVH, Grade II diastolic dysfunction  Continue Lasix 40 mg IV twice daily,  hydralazine 100 mg 3 times a day, carvedilol 6.25 mg twice daily and Imdur 60 mg daily Patient not on an ACE or ARB due to stage IV chronic kidney disease  Acute hypoxic respiratory failure 2/2 to acute diastolic CHF Patient noted to have room air pulse oximetry in the low 80s and is currently on 2 L with improvement in his pulse oximetry to greater than 92%. Will attempt to wean off oxygen once acute illness improves or resolves   Right foot infection:stable, continue doxycycline 100 mg p.o. twice daily to complete 7 day course  AKI (acute kidney injury) (HCC): Cr bumped up from 3.83 to 4.08 slowly while on diuretic therapy. F/u BMP in am Follow up strict in and out Will consult nephrology if renal function continues to get worse while on diuretics    Diabetes mellitus with complications of CKD (chronic kidney disease) stage 4, GFR 15-29 ml/min (HCC): Controlled SSI prn,  F/u FSBS   Essential hypertension: mildly controlled Continue carvedilol, hydralazine and Imdur Give IV Hydralazine 10mg  Q6hrs prn for SBP > 160     Subjective: Patient is seen and examined at the bedside.  No new complaints  Physical Exam: Vitals:   02/20/23 2318 02/21/23 0356 02/21/23 0753 02/21/23 1141  BP:   (!) 170/83 (!) 164/82  Pulse: 64 (!) 59 (!) 58  (!) 58  Resp: 16 20  18   Temp: 98.3 F (36.8 C) 98.2 F (36.8 C) 98.1 F (36.7 C) 98.1 F (36.7 C)  TempSrc: Oral Oral Oral   SpO2: 99% 96% 96% 97%  Weight:      Height:        Constitutional: NAD,  Eyes: PERRL, lids and conjunctivae normal, + xanthelasmata ENMT: Mucous membranes are moist. Posterior pharynx clear of any exudate or lesions. Age-appropriate dentition. Hearing appropriate Neck: normal, supple, no masses, no thyromegaly Respiratory: Mild Crackling b/l, Normal respiratory effort. No accessory muscle use.  Cardiovascular: Regular rate and rhythm, no murmurs / rubs / gallops.  Mild bilateral lower extremity trace edema. 2+ pedal pulses. No carotid bruits.  Abdomen: no tenderness, no masses palpated, no hepatosplenomegaly. Bowel sounds positive.  Musculoskeletal: no clubbing / cyanosis. No joint deformity upper and lower extremities. Good ROM, no contractures, no atrophy. Normal muscle tone.  Skin: Right lower extremity skin rash/petechiae in the anterior leg, continue to monitor. + Right foot wound, appears well-healed Neurologic: Sensations grossly intact. Strength 5/5 in all  extremities Psychiatric: Normal judgment and insight. Normal mood.     Data Reviewed:  Latest Reference Range & Units 02/21/23 03:48  Sodium 135 - 145 mmol/L 140  Potassium 3.5 - 5.1 mmol/L 4.7  Chloride 98 - 111 mmol/L 107  CO2 22 - 32 mmol/L 25  Glucose 70 - 99 mg/dL 811 (H)  BUN 8 - 23 mg/dL 83 (H)  Creatinine 9.14 - 1.24 mg/dL 7.82 (  H)  Calcium 8.9 - 10.3 mg/dL 8.0 (L)  Anion gap 5 - 15  8  GFR, Estimated >60 mL/min 16 (L)  (H): Data is abnormally high (L): Data is abnormally low  Family Communication: Plan of care discussed with patient at the bedside.  All questions and concerns have been addressed.  Disposition: Status is: Inpatient Remains inpatient appropriate because: On IV diuresis for acute CHF  Planned Discharge Destination: Home    Time spent: 35  minutes  Author: Ernestene Mention, MD 02/21/2023 12:24 PM  For on call review [email or web address].

## 2023-02-22 DIAGNOSIS — N179 Acute kidney failure, unspecified: Secondary | ICD-10-CM | POA: Diagnosis not present

## 2023-02-22 DIAGNOSIS — I5033 Acute on chronic diastolic (congestive) heart failure: Secondary | ICD-10-CM | POA: Diagnosis not present

## 2023-02-22 DIAGNOSIS — N189 Chronic kidney disease, unspecified: Secondary | ICD-10-CM | POA: Diagnosis not present

## 2023-02-22 LAB — GLUCOSE, CAPILLARY
Glucose-Capillary: 145 mg/dL — ABNORMAL HIGH (ref 70–99)
Glucose-Capillary: 195 mg/dL — ABNORMAL HIGH (ref 70–99)
Glucose-Capillary: 215 mg/dL — ABNORMAL HIGH (ref 70–99)
Glucose-Capillary: 150 mg/dL — ABNORMAL HIGH (ref 70–99)

## 2023-02-22 NOTE — Plan of Care (Signed)

## 2023-02-22 NOTE — Progress Notes (Signed)
Progress Note   Patient: [name] WJX:[id number] DOB: 1960/11/14 DOA: 02/19/2023     3 DOS: the patient was seen and examined on 02/22/2023   Brief hospital course: HPI: [name] is a 62 year old male with history of heart failure reduced ejection fraction, hyperlipidemia, hypertension, neuropathy, insulin-dependent diabetes mellitus 2, who has been admitted to hospital for acute on chronic diastolic dysfunction CHF.    Assessment and Plan:  Acute on chronic diastolic CHF (HCC) Reviewed echo 02/19/23: LVEF 50 to 55%, has no regional  wall motion abnormalities.Mild LVH, Grade II diastolic dysfunction  We will hold diuretics pending nephrology assessment.  Continue hydralazine 100 mg 3 times a day, carvedilol 6.25 mg twice daily and Imdur 60 mg daily Patient not on an ACE or ARB due to stage IV chronic kidney disease  Acute hypoxic respiratory failure 2/2 to acute diastolic CHF Currently on RA Will attempt to wean off oxygen once acute illness improves or resolves   Right foot infection:stable, continue doxycycline 100 mg p.o. twice daily to complete 7 day course  AKI (acute kidney injury) (HCC): Cr improving from 4.08 to 3.83 this am F/u BMP in am Follow up strict in and out Will consult nephrology as per cardiology's input We will hold diuretics pending nephrology assessment.   Diabetes mellitus with complications of CKD (chronic kidney disease) stage 4, GFR 15-29 ml/min (HCC): Controlled SSI prn,  F/u FSBS   Essential hypertension: mildly controlled Continue carvedilol, hydralazine and Imdur Give IV Hydralazine 10mg  Q6hrs prn for SBP > 160     Subjective: Patient is seen and examined at the bedside.  No new complaints  Physical Exam: Vitals:   02/21/23 2047 02/22/23 0343 02/22/23 0814 02/22/23 1112  BP: (!) 168/78 (!) 156/74 (!) 159/79 (!) 155/76  Pulse:  (!) 58 (!) 58 (!) 59  Resp:  18 18 18   Temp:  97.9 F (36.6 C) 97.8 F (36.6 C) 97.6 F (36.4 C)   TempSrc:  Oral    SpO2:  96% 97% 96%  Weight:      Height:        Constitutional: NAD,  Eyes: PERRL, lids and conjunctivae normal, + xanthelasmata ENMT: Mucous membranes are moist. Posterior pharynx clear of any exudate or lesions. Age-appropriate dentition. Hearing appropriate Neck: normal, supple, no masses, no thyromegaly Respiratory: Mild Crackling b/l, Normal respiratory effort. No accessory muscle use.  Cardiovascular: Regular rate and rhythm, no murmurs / rubs / gallops.  Mild bilateral lower extremity trace edema. 2+ pedal pulses. No carotid bruits.  Abdomen: no tenderness, no masses palpated, no hepatosplenomegaly. Bowel sounds positive.  Musculoskeletal: no clubbing / cyanosis. No joint deformity upper and lower extremities. Good ROM, no contractures, no atrophy. Normal muscle tone.  Skin: Right lower extremity skin rash/petechiae in the anterior leg, continue to monitor. + Right foot wound, appears well-healed Neurologic: Sensations grossly intact. Strength 5/5 in all  extremities Psychiatric: Normal judgment and insight. Normal mood.     Data Reviewed:  Latest Reference Range & Units 02/21/23 03:48  Sodium 135 - 145 mmol/L 140  Potassium 3.5 - 5.1 mmol/L 4.7  Chloride 98 - 111 mmol/L 107  CO2 22 - 32 mmol/L 25  Glucose 70 - 99 mg/dL 213 (H)  BUN 8 - 23 mg/dL 83 (H)  Creatinine 0.86 - 1.24 mg/dL 5.78 (H)  Calcium 8.9 - 10.3 mg/dL 8.0 (L)  Anion gap 5 - 15  8  GFR, Estimated >60 mL/min 16 (L)  (H): Data is abnormally  high (L): Data is abnormally low  Family Communication: Plan of care discussed with patient at the bedside.  All questions and concerns have been addressed.  Disposition: Status is: Inpatient Remains inpatient appropriate because: diuresis for acute CHF and renal failure  Planned Discharge Destination: Home    Time spent: 35 minutes  Author: Ernestene Mention, MD 02/22/2023 12:28 PM  For on call review [email or web address].

## 2023-02-22 NOTE — Progress Notes (Signed)
Rounding Note    Patient Name: [name] Date of Encounter: 02/22/2023  [facility] Cardiologist: Lorine Bears, MD   Subjective   No complaints this AM, asking when he can go home.   Inpatient Medications    Scheduled Meds:  aspirin EC  81 mg Oral Daily   atorvastatin  40 mg Oral QHS   carvedilol  6.25 mg Oral BID WC   cloNIDine  0.1 mg Oral BID   doxycycline  100 mg Oral BID   furosemide  40 mg Intravenous BID   gabapentin  400 mg Oral BID   heparin  5,000 Units Subcutaneous Q8H   hydrALAZINE  100 mg Oral TID   insulin aspart  0-9 Units Subcutaneous TID WC   isosorbide mononitrate  60 mg Oral BID   Continuous Infusions:  PRN Meds: acetaminophen **OR** acetaminophen, hydrALAZINE, morphine injection, ondansetron **OR** ondansetron (ZOFRAN) IV, senna-docusate   Vital Signs    Vitals:   02/21/23 2045 02/21/23 2047 02/22/23 0343 02/22/23 0814  BP:  (!) 168/78 (!) 156/74 (!) 159/79  Pulse: 60  (!) 58 (!) 58  Resp:   18 18  Temp: 98.4 F (36.9 C)  97.9 F (36.6 C) 97.8 F (36.6 C)  TempSrc: Oral  Oral   SpO2: 98%  96% 97%  Weight:      Height:        Intake/Output Summary (Last 24 hours) at 02/22/2023 1048 Last data filed at 02/22/2023 0817 Gross per 24 hour  Intake 480 ml  Output 1424 ml  Net -944 ml      02/19/2023   11:51 AM 01/29/2023    8:22 AM 12/30/2022   10:47 AM  Last 3 Weights  Weight (lbs) 214 lb 214 lb 212 lb 3.2 oz  Weight (kg) 97.07 kg 97.07 kg 96.253 kg      Telemetry    Telemetry system not working at the time of evaluation - Personally Reviewed  ECG    No new - Personally Reviewed  Physical Exam   GEN: No acute distress.   Neck: JVD not well visualized Cardiac: RRR, no rubs or gallops. 1/6 systolic murmur Respiratory: Bibasilar rales GI: Soft, nontender, mildly distended MS: bilateral trivial pitting LE edema Neuro:  Nonfocal  Psych: Normal affect   Labs    High Sensitivity Troponin:   Recent Labs   Lab 02/19/23 1155 02/19/23 1428  TROPONINIHS 17 18*     Chemistry Recent Labs  Lab 02/19/23 1155 02/20/23 0540 02/21/23 0348 02/22/23 0504  NA 136 138 140 139  K 5.4* 4.7 4.7 4.3  CL 110 110 107 105  CO2 20* 20* 25 25  GLUCOSE 133* 64* 125* 150*  BUN 83* 78* 83* 81*  CREATININE 3.83* 3.85* 4.08* 3.83*  CALCIUM 8.1* 8.3* 8.0* 8.3*  MG  --   --  2.4  --   PROT 6.7  --   --   --   ALBUMIN 3.3*  --   --   --   AST 21  --   --   --   ALT 21  --   --   --   ALKPHOS 50  --   --   --   BILITOT 0.6  --   --   --   GFRNONAA 17* 17* 16* 17*  ANIONGAP 6 8 8 9     Lipids No results for input(s): "CHOL", "TRIG", "HDL", "LABVLDL", "LDLCALC", "CHOLHDL" in the last 168 hours.  Hematology Recent  Labs  Lab 02/19/23 1155 02/20/23 0540 02/22/23 0504  WBC 5.6 4.4 4.5  RBC 3.12* 2.88* 2.85*  HGB 10.7* 9.9* 9.8*  HCT 32.3* 28.7* 28.5*  MCV 103.5* 99.7 100.0  MCH 34.3* 34.4* 34.4*  MCHC 33.1 34.5 34.4  RDW 13.1 13.1 12.7  PLT 172 173 166   Thyroid No results for input(s): "TSH", "FREET4" in the last 168 hours.  BNP Recent Labs  Lab 02/19/23 1155  BNP 1,502.1*    DDimer No results for input(s): "DDIMER" in the last 168 hours.   Radiology    No results found.  Cardiac Studies   Echo this admission with EF 50-55%  Patient Profile     62 y.o. male with PMH CAD, chronic diastolic heart failure with prior reduced EF, now recovered, ischemic cardiomyopathy, hypertension, hyperlipidemia, chronic kidney disease stage 4, type II diabetes, PAD, ethanol abuse admitted with volume overload  Assessment & Plan    Acute on chronic diastolic heart failure (prior systolic HF as well, but EF since recovered) Chronic kidney disease, stage 4, GFR 17 -Cr 3.81, has been over 4 this admission -recommend nephrology evaluation given continued volume overload in the setting of significant chronic kidney disease. Diuretics have been held at this time -I/O not well documented, no daily weights  to monitor changes  CAD Hyperlipidemia -continue aspirin, statin, beta blocker  Hypertension -BP remains elevated. On carvedilol, clonidine, furosemide, hydralazine, isosorbide. Intolerance to amlodipine. No ACEi/ARB/ARNI/MRA given renal function unless recommended by nephrology. Cannot increase carvedilol due to heart rates in 50s. On maximum dose hydralazine.  For questions or updates, please contact [facility] Please consult [email or web address] for contact info under        Signed, Jodelle Red, MD  02/22/2023, 10:48 AM

## 2023-02-23 ENCOUNTER — Inpatient Hospital Stay: Payer: Medicaid Other

## 2023-02-23 DIAGNOSIS — N179 Acute kidney failure, unspecified: Secondary | ICD-10-CM | POA: Diagnosis not present

## 2023-02-23 DIAGNOSIS — E785 Hyperlipidemia, unspecified: Secondary | ICD-10-CM

## 2023-02-23 DIAGNOSIS — N189 Chronic kidney disease, unspecified: Secondary | ICD-10-CM

## 2023-02-23 DIAGNOSIS — N2581 Secondary hyperparathyroidism of renal origin: Secondary | ICD-10-CM | POA: Diagnosis not present

## 2023-02-23 DIAGNOSIS — D631 Anemia in chronic kidney disease: Secondary | ICD-10-CM | POA: Diagnosis not present

## 2023-02-23 DIAGNOSIS — I10 Essential (primary) hypertension: Secondary | ICD-10-CM | POA: Diagnosis not present

## 2023-02-23 DIAGNOSIS — I5033 Acute on chronic diastolic (congestive) heart failure: Secondary | ICD-10-CM | POA: Diagnosis not present

## 2023-02-23 DIAGNOSIS — N184 Chronic kidney disease, stage 4 (severe): Secondary | ICD-10-CM | POA: Diagnosis not present

## 2023-02-23 LAB — BASIC METABOLIC PANEL WITH GFR
Anion gap: 8 (ref 5–15)
BUN: 80 mg/dL — ABNORMAL HIGH (ref 8–23)
CO2: 25 mmol/L (ref 22–32)
Calcium: 8.1 mg/dL — ABNORMAL LOW (ref 8.9–10.3)
Chloride: 103 mmol/L (ref 98–111)
Creatinine, Ser: 3.73 mg/dL — ABNORMAL HIGH (ref 0.61–1.24)
GFR, Estimated: 17 mL/min — ABNORMAL LOW (ref >60)
Glucose, Bld: 170 mg/dL — ABNORMAL HIGH (ref 70–99)
Potassium: 4.4 mmol/L (ref 3.5–5.1)
Sodium: 136 mmol/L (ref 135–145)

## 2023-02-23 LAB — GLUCOSE, CAPILLARY
Glucose-Capillary: 181 mg/dL — ABNORMAL HIGH (ref 70–99)
Glucose-Capillary: 249 mg/dL — ABNORMAL HIGH (ref 70–99)

## 2023-02-23 MED ORDER — FUROSEMIDE 40 MG PO TABS: 40.0000 mg | ORAL_TABLET | Freq: Every day | ORAL | Status: DC

## 2023-02-23 MED ORDER — CARVEDILOL 6.25 MG PO TABS: 6.2500 mg | ORAL_TABLET | Freq: Two times a day (BID) | ORAL | 1 refills | Status: DC

## 2023-02-23 MED ORDER — FUROSEMIDE 40 MG PO TABS: 40.0000 mg | ORAL_TABLET | Freq: Every day | ORAL | 1 refills | Status: DC

## 2023-02-23 MED ORDER — CLONIDINE HCL 0.1 MG PO TABS: 0.1000 mg | ORAL_TABLET | Freq: Two times a day (BID) | ORAL | 1 refills | Status: DC

## 2023-02-23 NOTE — Progress Notes (Signed)
Patient refusing Telemetry he stated the noise form telemetry is keeping him up. NP B.Jon Billings notified

## 2023-02-23 NOTE — Plan of Care (Signed)

## 2023-02-23 NOTE — Discharge Summary (Signed)
Physician Discharge Summary   Patient: [name] MRN: [id number] DOB: 12-19-1960  Admit date:     02/19/2023  Discharge date: 02/23/23  Discharge Physician: Enedina Finner   PCP: Smitty Cords, DO   Recommendations at discharge:   follow-up with [facility] cardiology for CHF follow-up in 1 to 2 weeks follow-up PCP in 1 to 2 follow-up nephrology Dr. Cherylann Ratel in one week.  Discharge Diagnoses: Principal Problem:   Acute on chronic heart failure (HCC) Active Problems:   Acute on chronic systolic congestive heart failure (HCC)   DM type 2 with diabetic peripheral neuropathy (HCC)   Benign hypertension with CKD (chronic kidney disease) stage IV (HCC)   Chronic combined systolic (congestive) and diastolic (congestive) heart failure (HCC)   Amputation of little toe (HCC)   Obesity (BMI 30.0-34.9)   Hyperlipidemia   Alcohol abuse   Essential hypertension   CKD (chronic kidney disease) stage 4, GFR 15-29 ml/min (HCC)   Acute kidney injury superimposed on chronic kidney disease (HCC)   Right foot infection   Acute on chronic heart failure with preserved ejection fraction (HFpEF) (HCC)  [name] is a 62 year old male with history of heart failure reduced ejection fraction, hyperlipidemia, hypertension, neuropathy, insulin-dependent diabetes mellitus 2, who has been admitted to hospital for acute on chronic diastolic dysfunction CHF.   Acute on chronic diastolic CHF (HCC) --Reviewed echo 02/19/23: LVEF 50 to 55%, has no regional  wall motion abnormalities.Mild LVH, Grade II diastolic dysfunction  --We will hold IV diuretics pending nephrology assessment.--now resumed lasix 40 mg qd --Continue hydralazine 100 mg 3 times a day, carvedilol 6.25 mg twice daily and Imdur 60 mg daily --Patient ok to resume low dose losartan per Dr Crissie Sickles --f/u Dr Gala Romney as out pt   AKI (acute kidney injury) [facility]): Cr improving from 4.08 to 3.83 this am --creat down to 3.7 --ok for po lasix  Acute  hypoxic respiratory failure 2/2 to acute diastolic CHF --Currently on RA --Feels at baseline   Right foot infection:stable, continue doxycycline 100 mg p.o. twice daily to complete 5 day course (out pt)    Diabetes mellitus with complications of CKD (chronic kidney disease) stage 4, GFR 15-29 ml/min (HCC): Controlled --SSI prn,  --Resumed home dose insulin and pt advised to adjust according to sugars at home   Essential hypertension: mildly controlled --Continue carvedilol, hydralazine and Imdur --Clonidine added by [facility] cards         Pain control - [facility] Controlled Substance Reporting System database was reviewed. and patient was instructed, not to drive, operate heavy machinery, perform activities at heights, swimming or participation in water activities or provide baby-sitting services while on Pain, Sleep and Anxiety Medications; until their outpatient Physician has advised to do so again. Also recommended to not to take more than prescribed Pain, Sleep and Anxiety Medications.  Consultants: nephrology, cardiology Disposition: Home Diet recommendation:  Discharge Diet Orders (From admission, onward)     Start     Ordered   02/23/23 0000  Diet - low sodium heart healthy        02/23/23 1350           Cardiac and Carb modified diet DISCHARGE MEDICATION: Allergies as of 02/23/2023       Reactions   Sulfamethoxazole-trimethoprim Other (See Comments)   AKI    Amlodipine Other (See Comments)   Edema   Lisinopril    Hyperkalemia   Jardiance [empagliflozin] Rash   Terbinafine And Related Rash  Medication List     STOP taking these medications    doxycycline 100 MG capsule Commonly known as: VIBRAMYCIN       TAKE these medications    Accu-Chek FastClix Lancets Misc Check sugar up to 3 x daily as instructed   Accu-Chek Guide test strip Generic drug: glucose blood Check blood sugar up to 3 times daily as advised   acetaminophen 650 MG CR  tablet Commonly known as: Tylenol 8 Hour Take 1 tablet (650 mg total) by mouth every 8 (eight) hours as needed for pain.   aspirin EC 81 MG tablet Take 81 mg by mouth daily.   atorvastatin 40 MG tablet Commonly known as: LIPITOR TAKE 1 TABLET BY MOUTH EVERY DAY   B-D ULTRAFINE III SHORT PEN 31G X 8 MM Misc Generic drug: Insulin Pen Needle USE TO INJECT INSULIN NIGHTLY   carvedilol 6.25 MG tablet Commonly known as: COREG Take 1 tablet (6.25 mg total) by mouth 2 (two) times daily with a meal. What changed:  medication strength See the new instructions.   cloNIDine 0.1 MG tablet Commonly known as: CATAPRES Take 1 tablet (0.1 mg total) by mouth 2 (two) times daily.   doxazosin 1 MG tablet Commonly known as: Cardura Take 2 tablets (2 mg total) by mouth at bedtime.   furosemide 40 MG tablet Commonly known as: LASIX Take 1 tablet (40 mg total) by mouth daily. Start taking on: February 24, 2023 What changed: Another medication with the same name was removed. Continue taking this medication, and follow the directions you see here.   gabapentin 400 MG capsule Commonly known as: NEURONTIN Take 1 capsule (400 mg total) by mouth 2 (two) times daily.   hydrALAZINE 100 MG tablet Commonly known as: APRESOLINE TAKE 1 TABLET BY MOUTH THREE TIMES A DAY   isosorbide mononitrate 60 MG 24 hr tablet Commonly known as: IMDUR TAKE 1 TABLET BY MOUTH 2 TIMES DAILY.   Lancets Misc. Misc Use  Brand compatable to insurance and monitor to check blood sugar up to 3 times daily. ICD10 E11.9   Lantus SoloStar 100 UNIT/ML Solostar Pen Generic drug: insulin glargine INJECT 40 UNITS INTO THE SKIN AT BEDTIME.   losartan 25 MG tablet Commonly known as: COZAAR Take 25 mg by mouth daily.        Follow-up Information     Smitty Cords, DO. Schedule an appointment as soon as possible for a visit in 1 week(s).   Specialty: Family Medicine Contact information: [address] [place]  [address] [phone]         Mady Haagensen, MD. Schedule an appointment as soon as possible for a visit in 1 week(s).   Specialty: Nephrology Why: CKD IV Contact information: [address] [place]atz [facility] [address] [phone]         Bensimhon, Bevelyn Buckles, MD. Schedule an appointment as soon as possible for a visit in 10 day(s).   Specialty: Cardiology Why: CHF f/u Contact information: [address] [place] [address] [phone]                Discharge Exam: Ceasar Mons Weights   02/19/23 1151  Weight: 97.1 kg   Alert and oriented times three respiratory clear to auscultation. No rales or rhonchi. No respiratory distress cardiovascular both heart sounds normal. No murmur extremity no pitting edema. Neuro- nonfocal  Condition at discharge: fair  The results of significant diagnostics from this hospitalization (including imaging, microbiology,  ancillary and laboratory) are listed below for reference.   Imaging Studies: ECHOCARDIOGRAM COMPLETE  Result Date: 02/20/2023    ECHOCARDIOGRAM REPORT   Patient Name:   [name] [facility] Date of Exam: 02/19/2023 Medical Rec #:  [id number]         Height:       68.0 in Accession #:    [id number]        Weight:       214.0 lb Date of Birth:  [birth year]          BSA:          2.103 m Patient Age:    62 years          BP:           120/64 mmHg Patient Gender: M                 HR:           58 bpm. Exam Location:  [facility] Procedure: 2D Echo, Cardiac Doppler and Color Doppler Indications:     R06.00 Dyspnea  History:         Patient has prior history of Echocardiogram examinations, most                  recent 08/08/2022. CAD; Risk Factors:Hypertension, Diabetes and                  Dyslipidemia. Chronic systolic heart failure.  Sonographer:     Daphine Deutscher RDCS Referring Phys:  [id number] AMY N COX Diagnosing Phys: Yvonne Kendall MD IMPRESSIONS  1. Left ventricular ejection fraction, by estimation, is 50 to 55%.  The left ventricle has low normal function. The left ventricle has no regional wall motion abnormalities. There is mild left ventricular hypertrophy. Left ventricular diastolic parameters are consistent with Grade II diastolic dysfunction (pseudonormalization). Elevated left atrial pressure.  2. Right ventricular systolic function is normal. The right ventricular size is mildly enlarged. Tricuspid regurgitation signal is inadequate for assessing PA pressure.  3. Left atrial size was mildly dilated.  4. The mitral valve is degenerative. Mild to moderate mitral valve regurgitation. No evidence of mitral stenosis.  5. The aortic valve is tricuspid. Aortic valve regurgitation is not visualized. No aortic stenosis is present.  6. There is borderline dilatation of the ascending aorta, measuring 38 mm.  7. The inferior vena cava is normal in size with greater than 50% respiratory variability, suggesting right atrial pressure of 3 mmHg. FINDINGS  Left Ventricle: Left ventricular ejection fraction, by estimation, is 50 to 55%. The left ventricle has low normal function. The left ventricle has no regional wall motion abnormalities. The left ventricular internal cavity size was normal in size. There is mild left ventricular hypertrophy. Left ventricular diastolic parameters are consistent with Grade II diastolic dysfunction (pseudonormalization). Elevated left atrial pressure. Right Ventricle: The right ventricular size is mildly enlarged. No increase in right ventricular wall thickness. Right ventricular systolic function is normal. Tricuspid regurgitation signal is inadequate for assessing PA pressure. Left Atrium: Left atrial size was mildly dilated. Right Atrium: Right atrial size was normal in size. Pericardium: There is no evidence of pericardial effusion. Mitral Valve: The mitral valve is degenerative in appearance. Mild to moderate mitral annular calcification. Mild to moderate mitral valve regurgitation. No evidence  of mitral valve stenosis. Tricuspid Valve: The tricuspid valve is normal in structure. Tricuspid valve regurgitation is trivial. Aortic Valve: The aortic valve is tricuspid. Aortic valve regurgitation is not visualized.  No aortic stenosis is present. Aortic valve mean gradient measures 5.0 mmHg. Aortic valve peak gradient measures 9.2 mmHg. Aortic valve area, by VTI measures 2.26 cm. Pulmonic Valve: The pulmonic valve was not well visualized. Pulmonic valve regurgitation is not visualized. No evidence of pulmonic stenosis. Aorta: The aortic root is normal in size and structure. There is borderline dilatation of the ascending aorta, measuring 38 mm. Pulmonary Artery: The pulmonary artery is of normal size. Venous: The inferior vena cava is normal in size with greater than 50% respiratory variability, suggesting right atrial pressure of 3 mmHg. IAS/Shunts: The interatrial septum was not well visualized.  LEFT VENTRICLE PLAX 2D LVIDd:         5.10 cm   Diastology LVIDs:         3.70 cm   LV e' medial:    6.74 cm/s LV PW:         1.20 cm   LV E/e' medial:  17.2 LV IVS:        1.10 cm   LV e' lateral:   9.51 cm/s LVOT diam:     2.20 cm   LV E/e' lateral: 12.2 LV SV:         82 LV SV Index:   39 LVOT Area:     3.80 cm  RIGHT VENTRICLE             IVC RV Basal diam:  4.30 cm     IVC diam: 2.00 cm RV S prime:     11.75 cm/s TAPSE (M-mode): 2.5 cm LEFT ATRIUM             Index        RIGHT ATRIUM           Index LA diam:        5.30 cm 2.52 cm/m   RA Area:     19.60 cm LA Vol (A2C):   67.9 ml 32.28 ml/m  RA Volume:   60.60 ml  28.81 ml/m LA Vol (A4C):   99.1 ml 47.12 ml/m LA Biplane Vol: 84.0 ml 39.94 ml/m  AORTIC VALVE AV Area (Vmax):    2.26 cm AV Area (Vmean):   2.08 cm AV Area (VTI):     2.26 cm AV Vmax:           151.95 cm/s AV Vmean:          107.426 cm/s AV VTI:            0.363 m AV Peak Grad:      9.2 mmHg AV Mean Grad:      5.0 mmHg LVOT Vmax:         90.30 cm/s LVOT Vmean:        58.750 cm/s LVOT VTI:           0.216 m LVOT/AV VTI ratio: 0.59  AORTA Ao Root diam: 3.60 cm Ao Asc diam:  3.80 cm MITRAL VALVE MV Area (PHT): 4.15 cm     SHUNTS MV Decel Time: 183 msec     Systemic VTI:  0.22 m MV E velocity: 116.00 cm/s  Systemic Diam: 2.20 cm MV A velocity: 51.90 cm/s MV E/A ratio:  2.24 Yvonne Kendall MD Electronically signed by Yvonne Kendall MD Signature Date/Time: 02/20/2023/5:02:40 PM    Final    DG Chest 2 View  Result Date: 02/19/2023 CLINICAL DATA:  Several day history of shortness of breath EXAM: CHEST - 2 VIEW COMPARISON:  Chest radiograph dated 10/18/2016 FINDINGS:  Normal lung volumes. Mild bilateral interstitial opacities. Trace bilateral pleural effusions. No pneumothorax. Similar mildly enlarged cardiomediastinal silhouette. No acute osseous abnormality. IMPRESSION: 1. Mild pulmonary edema. 2. Trace bilateral pleural effusions. Electronically Signed   By: Agustin Cree M.D.   On: 02/19/2023 13:39    Microbiology: Results for orders placed or performed during the hospital encounter of 07/20/18  Aerobic Culture  (superficial specimen)     Status: None   Collection Time: 07/20/18 11:35 AM   Specimen: Toe; Wound  Result Value Ref Range Status   Specimen Description   Final    TOE RIGHT Performed at [facility], [address] [place], [place] [address]    Special Requests   Final    GREAT Performed at [facility], [address] [place], [place] [address]    Gram Stain   Final    FEW WBC PRESENT, PREDOMINANTLY PMN RARE GRAM POSITIVE COCCI Performed at [facility], [address] [place], [place] [address]    Culture FEW [facility] MORGANII  Final   Report Status 07/22/2018 FINAL  Final   Organism ID, Bacteria MORGANELLA MORGANII  Final      Susceptibility   Morganella morganii - MIC*    AMPICILLIN >=32 RESISTANT Resistant     CEFAZOLIN >=64 RESISTANT Resistant     CEFEPIME <=1 SENSITIVE Sensitive     CEFTAZIDIME <=1 SENSITIVE Sensitive      CEFTRIAXONE <=1 SENSITIVE Sensitive     CIPROFLOXACIN <=0.25 SENSITIVE Sensitive     GENTAMICIN <=1 SENSITIVE Sensitive     IMIPENEM 2 SENSITIVE Sensitive     TRIMETH/SULFA <=20 SENSITIVE Sensitive     AMPICILLIN/SULBACTAM >=32 RESISTANT Resistant     PIP/TAZO <=4 SENSITIVE Sensitive     * FEW MORGANELLA MORGANII    Labs: CBC: Recent Labs  Lab 02/19/23 1155 02/20/23 0540 02/22/23 0504  WBC 5.6 4.4 4.5  HGB 10.7* 9.9* 9.8*  HCT 32.3* 28.7* 28.5*  MCV 103.5* 99.7 100.0  PLT 172 173 166   Basic Metabolic Panel: Recent Labs  Lab 02/19/23 1155 02/20/23 0540 02/21/23 0348 02/22/23 0504 02/23/23 0617  NA 136 138 140 139 136  K 5.4* 4.7 4.7 4.3 4.4  CL 110 110 107 105 103  CO2 20* 20* 25 25 25   GLUCOSE 133* 64* 125* 150* 170*  BUN 83* 78* 83* 81* 80*  CREATININE 3.83* 3.85* 4.08* 3.83* 3.73*  CALCIUM 8.1* 8.3* 8.0* 8.3* 8.1*  MG  --   --  2.4  --   --    Liver Function Tests: Recent Labs  Lab 02/19/23 1155  AST 21  ALT 21  ALKPHOS 50  BILITOT 0.6  PROT 6.7  ALBUMIN 3.3*   CBG: Recent Labs  Lab 02/22/23 1112 02/22/23 1743 02/22/23 2155 02/23/23 0801 02/23/23 1255  GLUCAP 195* 215* 150* 181* 249*    Discharge time spent: greater than 30 minutes.  Signed: Enedina Finner, MD [facility] 02/23/2023

## 2023-02-23 NOTE — Progress Notes (Signed)
Rounding Note    Patient Name: [name] Date of Encounter: 02/23/2023  [facility] Cardiologist: Lorine Bears, MD   Subjective   No acute events overnight. Seen by nephrology this AM. Asking when he can go home. Denies chest pain or shortness of breath.  Inpatient Medications    Scheduled Meds:  aspirin EC  81 mg Oral Daily   atorvastatin  40 mg Oral QHS   carvedilol  6.25 mg Oral BID WC   cloNIDine  0.1 mg Oral BID   doxycycline  100 mg Oral BID   furosemide  40 mg Intravenous BID   gabapentin  400 mg Oral BID   heparin  5,000 Units Subcutaneous Q8H   hydrALAZINE  100 mg Oral TID   insulin aspart  0-9 Units Subcutaneous TID WC   isosorbide mononitrate  60 mg Oral BID   Continuous Infusions:  PRN Meds: acetaminophen **OR** acetaminophen, hydrALAZINE, morphine injection, ondansetron **OR** ondansetron (ZOFRAN) IV, senna-docusate   Vital Signs    Vitals:   02/22/23 1112 02/22/23 1742 02/22/23 2145 02/23/23 0812  BP: (!) 155/76 (!) 171/85 (!) 167/134 (!) 160/83  Pulse: (!) 59 (!) 57 61 61  Resp: 18 18 16 18   Temp: 97.6 F (36.4 C) 97.7 F (36.5 C) 98 F (36.7 C) 98.2 F (36.8 C)  TempSrc:      SpO2: 96% 97% 97% 97%  Weight:      Height:        Intake/Output Summary (Last 24 hours) at 02/23/2023 1039 Last data filed at 02/22/2023 1800 Gross per 24 hour  Intake --  Output 920 ml  Net -920 ml      02/19/2023   11:51 AM 01/29/2023    8:22 AM 12/30/2022   10:47 AM  Last 3 Weights  Weight (lbs) 214 lb 214 lb 212 lb 3.2 oz  Weight (kg) 97.07 kg 97.07 kg 96.253 kg      Telemetry    SR - Personally Reviewed  ECG    No new - Personally Reviewed  Physical Exam   GEN: Well nourished, well developed in no acute distress NECK: No JVD sitting upright at 90 degrees CARDIAC: regular rhythm, normal S1 and S2, no rubs or gallops. 1/6 systolic murmur. VASCULAR: Radial pulses 2+ bilaterally.  RESPIRATORY:  Mild rales at bilateral  bases ABDOMEN: Soft, non-tender, non-distended MUSCULOSKELETAL:  Moves all 4 limbs independently SKIN: Warm and dry, 1+ bilateral ankle edema NEUROLOGIC:  No focal neuro deficits noted. PSYCHIATRIC:  Normal affect    Labs    High Sensitivity Troponin:   Recent Labs  Lab 02/19/23 1155 02/19/23 1428  TROPONINIHS 17 18*     Chemistry Recent Labs  Lab 02/19/23 1155 02/20/23 0540 02/21/23 0348 02/22/23 0504 02/23/23 0617  NA 136   < > 140 139 136  K 5.4*   < > 4.7 4.3 4.4  CL 110   < > 107 105 103  CO2 20*   < > 25 25 25   GLUCOSE 133*   < > 125* 150* 170*  BUN 83*   < > 83* 81* 80*  CREATININE 3.83*   < > 4.08* 3.83* 3.73*  CALCIUM 8.1*   < > 8.0* 8.3* 8.1*  MG  --   --  2.4  --   --   PROT 6.7  --   --   --   --   ALBUMIN 3.3*  --   --   --   --  AST 21  --   --   --   --   ALT 21  --   --   --   --   ALKPHOS 50  --   --   --   --   BILITOT 0.6  --   --   --   --   GFRNONAA 17*   < > 16* 17* 17*  ANIONGAP 6   < > 8 9 8    < > = values in this interval not displayed.    Lipids No results for input(s): "CHOL", "TRIG", "HDL", "LABVLDL", "LDLCALC", "CHOLHDL" in the last 168 hours.  Hematology Recent Labs  Lab 02/19/23 1155 02/20/23 0540 02/22/23 0504  WBC 5.6 4.4 4.5  RBC 3.12* 2.88* 2.85*  HGB 10.7* 9.9* 9.8*  HCT 32.3* 28.7* 28.5*  MCV 103.5* 99.7 100.0  MCH 34.3* 34.4* 34.4*  MCHC 33.1 34.5 34.4  RDW 13.1 13.1 12.7  PLT 172 173 166   Thyroid No results for input(s): "TSH", "FREET4" in the last 168 hours.  BNP Recent Labs  Lab 02/19/23 1155  BNP 1,502.1*    DDimer No results for input(s): "DDIMER" in the last 168 hours.   Radiology    No results found.  Cardiac Studies   Echo this admission with EF 50-55%  Patient Profile     62 y.o. male with PMH CAD, chronic diastolic heart failure with prior reduced EF, now recovered, ischemic cardiomyopathy, hypertension, hyperlipidemia, chronic kidney disease stage 4, type II diabetes, PAD, ethanol abuse  admitted with volume overload  Assessment & Plan    Acute on chronic diastolic heart failure (prior systolic HF as well, but EF since recovered) Chronic kidney disease, stage 4, GFR 17 -Cr 3.73, has been over 4 this admission -nephrology has been consulted given continued volume overload in the setting of significant chronic kidney disease. Diuretics have been held at this time -I/O not well documented, no daily weights to monitor changes -he still has rales and LE edema  CAD Hyperlipidemia -continue aspirin, statin, beta blocker  Hypertension -BP remains elevated. On carvedilol, clonidine, hydralazine, isosorbide. Intolerance to amlodipine. No ACEi/ARB/ARNI/MRA given renal function unless recommended by nephrology. Cannot increase carvedilol or clonidine due to heart rates in 50s. On maximum dose hydralazine.  For questions or updates, please contact [facility] Please consult [email or web address] for contact info under        Signed, Jodelle Red, MD  02/23/2023, 10:39 AM

## 2023-02-23 NOTE — Progress Notes (Addendum)
[facility]  ROUNDING NOTE   Subjective:   [name] is a 62 y.o. male with a past medical history of diabetes mellitus type 2 with chronic kidney disease, chronic kidney disease stage IV, hypertension, lower extremity edema, chronic systolic heart failure, anemia of chronic kidney disease, and secondary hyperparathyroidism. Patient presented to the ED with neuropathy pain and lower extremity edema for 4 days. He was admitted for Acute on chronic heart failure (HCC) [I50.9] Acute on chronic congestive heart failure, unspecified heart failure type [facility]) [I50.9]  Patient is known to our clinic and is followed by Dr Cherylann Ratel. He was last seen in office on January 15, 2023 for routine follow up. He states he has had shortness of breath and loer extremity edema for 3-4 days. He also states his abdomen felt distended when he arrived. HE has taken his medications as prescribed. H&P states his spouse reports patient drinks a lot of water.   Labs during this admission indicate a peak in creatinine to 4.08, now 3.73. BNP 1500. Chest xray shows mild pulmonary edema.   We have been consulted to evaluate acute kidney injury.   Objective:  Vital signs in last 24 hours:  Temp:  [97.6 F (36.4 C)-98.2 F (36.8 C)] 98.2 F (36.8 C) (08/11 0812) Pulse Rate:  [57-61] 61 (08/11 0812) Resp:  [16-18] 18 (08/11 0812) BP: (155-171)/(76-134) 160/83 (08/11 0812) SpO2:  [96 %-97 %] 97 % (08/11 0812)  Weight change:  Filed Weights   02/19/23 1151  Weight: 97.1 kg    Intake/Output: I/O last 3 completed shifts: In: -  Out: 1844 [Urine:1844]   Intake/Output this shift:  No intake/output data recorded.  Physical Exam: General: NAD  Head: Normocephalic, atraumatic. Moist oral mucosal membranes  Eyes: Anicteric  Lungs:  Clear to auscultation  Heart: Regular rate and rhythm  Abdomen:  Soft, nontender, mild distention   Extremities:  1+ peripheral edema.  Neurologic: Alert, moving all  four extremities  Skin: No lesions  Access: None     Basic Metabolic Panel: Recent Labs  Lab 02/19/23 1155 02/20/23 0540 02/21/23 0348 02/22/23 0504 02/23/23 0617  NA 136 138 140 139 136  K 5.4* 4.7 4.7 4.3 4.4  CL 110 110 107 105 103  CO2 20* 20* 25 25 25   GLUCOSE 133* 64* 125* 150* 170*  BUN 83* 78* 83* 81* 80*  CREATININE 3.83* 3.85* 4.08* 3.83* 3.73*  CALCIUM 8.1* 8.3* 8.0* 8.3* 8.1*  MG  --   --  2.4  --   --     Liver Function Tests: Recent Labs  Lab 02/19/23 1155  AST 21  ALT 21  ALKPHOS 50  BILITOT 0.6  PROT 6.7  ALBUMIN 3.3*   No results for input(s): "LIPASE", "AMYLASE" in the last 168 hours. No results for input(s): "AMMONIA" in the last 168 hours.  CBC: Recent Labs  Lab 02/19/23 1155 02/20/23 0540 02/22/23 0504  WBC 5.6 4.4 4.5  HGB 10.7* 9.9* 9.8*  HCT 32.3* 28.7* 28.5*  MCV 103.5* 99.7 100.0  PLT 172 173 166    Cardiac Enzymes: No results for input(s): "CKTOTAL", "CKMB", "CKMBINDEX", "TROPONINI" in the last 168 hours.  BNP: Invalid input(s): "POCBNP"  CBG: Recent Labs  Lab 02/22/23 0815 02/22/23 1112 02/22/23 1743 02/22/23 2155 02/23/23 0801  GLUCAP 145* 195* 215* 150* 181*    Microbiology: Results for orders placed or performed during the hospital encounter of 07/20/18  Aerobic Culture  (superficial specimen)     Status:  None   Collection Time: 07/20/18 11:35 AM   Specimen: Toe; Wound  Result Value Ref Range Status   Specimen Description   Final    TOE RIGHT Performed at [facility], [address], [place], [place] [address]    Special Requests   Final    GREAT Performed at [facility], [address] [place], [place] [address]    Gram Stain   Final    FEW WBC PRESENT, PREDOMINANTLY PMN RARE GRAM POSITIVE COCCI Performed at [facility], [address], [place], [place] [address]    Culture FEW [facility] MORGANII  Final   Report Status 07/22/2018 FINAL  Final   Organism ID, Bacteria  MORGANELLA MORGANII  Final      Susceptibility   Morganella morganii - MIC*    AMPICILLIN >=32 RESISTANT Resistant     CEFAZOLIN >=64 RESISTANT Resistant     CEFEPIME <=1 SENSITIVE Sensitive     CEFTAZIDIME <=1 SENSITIVE Sensitive     CEFTRIAXONE <=1 SENSITIVE Sensitive     CIPROFLOXACIN <=0.25 SENSITIVE Sensitive     GENTAMICIN <=1 SENSITIVE Sensitive     IMIPENEM 2 SENSITIVE Sensitive     TRIMETH/SULFA <=20 SENSITIVE Sensitive     AMPICILLIN/SULBACTAM >=32 RESISTANT Resistant     PIP/TAZO <=4 SENSITIVE Sensitive     * FEW MORGANELLA MORGANII    Coagulation Studies: No results for input(s): "LABPROT", "INR" in the last 72 hours.  Urinalysis: No results for input(s): "COLORURINE", "LABSPEC", "PHURINE", "GLUCOSEU", "HGBUR", "BILIRUBINUR", "KETONESUR", "PROTEINUR", "UROBILINOGEN", "NITRITE", "LEUKOCYTESUR" in the last 72 hours.  Invalid input(s): "APPERANCEUR"    Imaging: No results found.   Medications:     aspirin EC  81 mg Oral Daily   atorvastatin  40 mg Oral QHS   carvedilol  6.25 mg Oral BID WC   cloNIDine  0.1 mg Oral BID   doxycycline  100 mg Oral BID   furosemide  40 mg Intravenous BID   gabapentin  400 mg Oral BID   heparin  5,000 Units Subcutaneous Q8H   hydrALAZINE  100 mg Oral TID   insulin aspart  0-9 Units Subcutaneous TID WC   isosorbide mononitrate  60 mg Oral BID   acetaminophen **OR** acetaminophen, hydrALAZINE, morphine injection, ondansetron **OR** ondansetron (ZOFRAN) IV, senna-docusate  Assessment/ Plan:  [name] is a 62 y.o.  male with a past medical history of diabetes mellitus type 2 with chronic kidney disease, chronic kidney disease stage IV, hypertension, lower extremity edema, chronic systolic heart failure, anemia of chronic kidney disease, and secondary hyperparathyroidism. Patient presented to the ED with neuropathy pain and lower extremity edema for 4 days. He was admitted for Acute on chronic heart failure (HCC)  [I50.9] Acute on chronic congestive heart failure, unspecified heart failure type (HCC) [I50.9]   Acute Kidney Injury on chronic kidney disease stage IV with baseline creatinine 3.14 and GFR of 21 on 01/15/23.  Acute kidney injury secondary to aggressive diuresis and fluid overload. Chronic kidney disease is secondary to type 2 diabetes. Was receiving Furosemide 40mg  twice daily, held since Saturday. Will order renal ultrasound to rule out obstruction. Will restart oral furosemide 40 mg daily.  No acute need for dialysis.    Lab Results  Component Value Date   CREATININE 3.73 (H) 02/23/2023   CREATININE 3.83 (H) 02/22/2023   CREATININE 4.08 (H) 02/21/2023    Intake/Output Summary (Last 24 hours) at 02/23/2023 1026 Last data filed at 02/22/2023 1800 Gross per  24 hour  Intake --  Output 920 ml  Net -920 ml    2. Anemia of chronic kidney disease Lab Results  Component Value Date   HGB 9.8 (L) 02/22/2023    Hgb within desired range, will monitor for now.   3. Secondary Hyperparathyroidism: with outpatient labs: PTH 102, phosphorus 4.3, calcium 8.5 on 01/15/23.   Lab Results  Component Value Date   CALCIUM 8.1 (L) 02/23/2023   Will continue to monitor bone minerals during this admission.   4. Diabetes mellitus type II with chronic kidney disease/renal manifestations: insulin dependent. Home regimen includes Lantus. Most recent hemoglobin A1c is 8.6 on 01/30/23.   - Continue glucose control     LOS: 4   8/11/202410:26 AM

## 2023-02-24 ENCOUNTER — Other Ambulatory Visit: Payer: Self-pay | Admitting: Internal Medicine

## 2023-02-24 ENCOUNTER — Other Ambulatory Visit: Payer: Self-pay | Admitting: Family Medicine

## 2023-02-24 DIAGNOSIS — E1142 Type 2 diabetes mellitus with diabetic polyneuropathy: Secondary | ICD-10-CM

## 2023-02-25 ENCOUNTER — Encounter: Payer: Medicaid Other | Admitting: Family

## 2023-02-26 DIAGNOSIS — I10 Essential (primary) hypertension: Secondary | ICD-10-CM | POA: Diagnosis not present

## 2023-02-26 DIAGNOSIS — D631 Anemia in chronic kidney disease: Secondary | ICD-10-CM | POA: Diagnosis not present

## 2023-02-26 DIAGNOSIS — I5022 Chronic systolic (congestive) heart failure: Secondary | ICD-10-CM | POA: Diagnosis not present

## 2023-02-26 DIAGNOSIS — E1122 Type 2 diabetes mellitus with diabetic chronic kidney disease: Secondary | ICD-10-CM | POA: Diagnosis not present

## 2023-02-26 DIAGNOSIS — N184 Chronic kidney disease, stage 4 (severe): Secondary | ICD-10-CM | POA: Diagnosis not present

## 2023-02-27 ENCOUNTER — Telehealth: Payer: Self-pay

## 2023-02-27 NOTE — Transitions of Care (Post Inpatient/ED Visit) (Signed)
   02/27/2023  Name: [name] MRN: [id number] DOB: 21-Jul-1960  Today's TOC FU Call Status:    Attempted to reach the patient regarding the most recent Inpatient/ED visit.    Follow Up Plan: Additional outreach attempts will be made to reach the patient to complete the Transitions of Care (Post Inpatient/ED visit) call.    J. Cristela Felt, RN, BSN, MSN Care Management Coordinator/ Phone Number:  [phone]

## 2023-02-28 ENCOUNTER — Other Ambulatory Visit: Payer: Medicaid Other

## 2023-02-28 ENCOUNTER — Telehealth: Payer: Self-pay

## 2023-02-28 ENCOUNTER — Ambulatory Visit: Payer: Medicaid Other | Admitting: Family Medicine

## 2023-02-28 ENCOUNTER — Encounter: Payer: Self-pay | Admitting: Family Medicine

## 2023-02-28 VITALS — BP 118/56 | HR 58 | Temp 96.9°F | Ht 68.0 in | Wt 209.0 lb

## 2023-02-28 DIAGNOSIS — E1142 Type 2 diabetes mellitus with diabetic polyneuropathy: Secondary | ICD-10-CM | POA: Diagnosis not present

## 2023-02-28 DIAGNOSIS — I5033 Acute on chronic diastolic (congestive) heart failure: Secondary | ICD-10-CM | POA: Diagnosis not present

## 2023-02-28 MED ORDER — GVOKE HYPOPEN 2-PACK 1 MG/0.2ML ~~LOC~~ SOAJ
1.0000 mg | SUBCUTANEOUS | 2 refills | Status: AC | PRN
Start: 2023-02-28 — End: ?

## 2023-02-28 NOTE — Patient Instructions (Addendum)
Thank you for coming to the office today.  Continue Lasix 40mg  daily, please review fluid discussion with Cardiology office on 03/05/23 next week  Keep up with Nephrology Kidney specialist  Last check the kidney function had improved  Continue Lantus 40 unit daily, improve diet for low carb low sugar.  I am hesitant to keep raising Lantus due to risk of Hypoglycemia with kidney function  GVoke rescue pen for HYPOglycemia, low sugar < 60, and feel sick. Use this only for emergency.  Keep up with apts.  Please schedule a Follow-up Appointment to: Return in about 9 weeks (around 05/02/2023) for 2+ months for DM A1c, HTN, CHF follow-up.  If you have any other questions or concerns, please feel free to call the office or send a message through MyChart. You may also schedule an earlier appointment if necessary.  Additionally, you may be receiving a survey about your experience at our office within a few days to 1 week by e-mail or mail. We value your feedback.  [name], DO [facility], [place]

## 2023-02-28 NOTE — Transitions of Care (Post Inpatient/ED Visit) (Signed)
   02/28/2023  Name: [name] MRN: [id number] DOB: 08-18-1960  Today's TOC FU Call Status:    Attempted to reach the patient regarding the most recent Inpatient/ED visit.  Patient states he is doing well.  Has follow-up appointment today with his PCP and is unable to complete TOC call at this time.    Follow Up Plan: Additional outreach attempts will be made to reach the patient to complete the Transitions of Care (Post Inpatient/ED visit) call.   Amelda Hapke J. Cristela Felt, RN, BSN, MSN Care Management Coordinator/[place] Phone Number:  [phone]

## 2023-02-28 NOTE — Progress Notes (Unsigned)
Subjective:    Patient ID: [name], male    DOB: [birth year], 62 y.o.   MRN: [id number]  [name] is a 62 y.o. male presenting on 02/28/2023 for Hospitalization Follow-up (Dx:  Acute on chronic heart failure, pt states feeling better)   HPI  HOSPITAL FOLLOW-UP VISIT  Hospital/Location: [facility] Date of Admission: 02/19/23 Date of Discharge: 02/23/23 Transitions of care telephone call: Attempted x 2 Amada Kingfisher RN 02/28/23  Reason for Admission: CHF  - Hospital H&P and Discharge Summary have been reviewed - Patient presents today 5 days after recent hospitalization. Brief summary of recent course, patient had symptoms of shortness of breath and lower extremity swelling for few weeks, gradual worsening, hospitalized, treated with diuretics, he had elevated Creatinine with AoCKD, IV and thought to be in cardiorenal syndrome, he saw his Nephrology Dr Cherylann Ratel on 02/26/23 2 days ago, for additional blood work. After diuresis his fluid levels have improved and he was discharged. In past he did not tolerate SGLT2.  He has HFU w/ CHF Clinic [facility] NP on 03/05/23  Type 2 Diabetes Last A1c 8.6, it is improved from prior >10 range, but higher than 7s. He is on Insulin lantus 40 units daily No longer on Metformin due to CKD He had one episode CBG 87 in AM, but resolved w/ meal No significant hypoglycemia  - Today reports overall has done well after discharge. Symptoms of edema and dyspnea have resolved. Fluid weight is down. are much improved   - New medications on discharge: None - Changes to current meds on discharge: none  I have reviewed the discharge medication list, and have reconciled the current and discharge medications today.   Current Outpatient Medications:    ACCU-CHEK FASTCLIX LANCETS MISC, Check sugar up to 3 x daily as instructed, Disp: 102 each, Rfl: 12   ACCU-CHEK GUIDE test strip, Check blood sugar up to 3 times daily as advised, Disp: 100 each, Rfl: 12    acetaminophen (TYLENOL 8 HOUR) 650 MG CR tablet, Take 1 tablet (650 mg total) by mouth every 8 (eight) hours as needed for pain., Disp: , Rfl:    aspirin EC 81 MG tablet, Take 81 mg by mouth daily., Disp: , Rfl:    atorvastatin (LIPITOR) 40 MG tablet, TAKE 1 TABLET BY MOUTH EVERY DAY, Disp: 90 tablet, Rfl: 1   carvedilol (COREG) 6.25 MG tablet, Take 1 tablet (6.25 mg total) by mouth 2 (two) times daily with a meal., Disp: 60 tablet, Rfl: 1   cloNIDine (CATAPRES) 0.1 MG tablet, Take 1 tablet (0.1 mg total) by mouth 2 (two) times daily., Disp: 60 tablet, Rfl: 1   doxazosin (CARDURA) 1 MG tablet, TAKE 2 TABLETS (2 MG TOTAL) BY MOUTH AT BEDTIME., Disp: 180 tablet, Rfl: 1   furosemide (LASIX) 40 MG tablet, Take 1 tablet (40 mg total) by mouth daily., Disp: 30 tablet, Rfl: 1   gabapentin (NEURONTIN) 400 MG capsule, Take 1 capsule (400 mg total) by mouth 2 (two) times daily., Disp: 360 capsule, Rfl: 1   GVOKE HYPOPEN 2-PACK 1 MG/0.2ML SOAJ, Inject 1 mg into the skin as needed (hypoglycemia)., Disp: 1 mL, Rfl: 2   hydrALAZINE (APRESOLINE) 100 MG tablet, TAKE 1 TABLET BY MOUTH THREE TIMES A DAY, Disp: 270 tablet, Rfl: 1   Insulin Pen Needle (B-D ULTRAFINE III SHORT PEN) 31G X 8 MM MISC, USE TO INJECT INSULIN NIGHTLY, Disp: 100 each, Rfl: 6   isosorbide mononitrate (IMDUR) 60 MG 24 hr  tablet, TAKE 1 TABLET BY MOUTH 2 TIMES DAILY., Disp: 180 tablet, Rfl: 1   Lancets Misc. MISC, Use  Brand compatable to insurance and monitor to check blood sugar up to 3 times daily. ICD10 E11.9, Disp: 100 each, Rfl: 12   LANTUS SOLOSTAR 100 UNIT/ML Solostar Pen, INJECT 40 UNITS INTO THE SKIN AT BEDTIME., Disp: 45 mL, Rfl: 1   losartan (COZAAR) 25 MG tablet, Take 25 mg by mouth daily., Disp: , Rfl:   ------------------------------------------------------------------------- Social History   Tobacco Use   Smoking status: Never   Smokeless tobacco: Never  Vaping Use   Vaping status: Never Used  Substance Use Topics    Alcohol use: Yes    Comment: occassionally   Drug use: No    Review of Systems Per HPI unless specifically indicated above     Objective:    BP (!) 118/56   Pulse (!) 58   Temp (!) 96.9 F (36.1 C) (Temporal)   Ht 5\' 8"  (1.727 m)   Wt 209 lb (94.8 kg)   SpO2 96%   BMI 31.78 kg/m   Wt Readings from Last 3 Encounters:  02/28/23 209 lb (94.8 kg)  02/19/23 214 lb (97.1 kg)  01/29/23 214 lb (97.1 kg)    Physical Exam Vitals and nursing note reviewed.  Constitutional:      General: He is not in acute distress.    Appearance: He is well-developed. He is not diaphoretic.     Comments: Well-appearing, comfortable, cooperative  HENT:     Head: Normocephalic and atraumatic.  Eyes:     General:        Right eye: No discharge.        Left eye: No discharge.     Conjunctiva/sclera: Conjunctivae normal.  Neck:     Thyroid: No thyromegaly.  Cardiovascular:     Rate and Rhythm: Normal rate and regular rhythm.     Pulses: Normal pulses.     Heart sounds: Normal heart sounds. No murmur heard. Pulmonary:     Effort: Pulmonary effort is normal. No respiratory distress.     Breath sounds: Normal breath sounds. No wheezing or rales.  Musculoskeletal:        General: Normal range of motion.     Cervical back: Normal range of motion and neck supple.     Right lower leg: No edema.     Left lower leg: No edema.  Lymphadenopathy:     Cervical: No cervical adenopathy.  Skin:    General: Skin is warm and dry.     Findings: No erythema or rash.  Neurological:     Mental Status: He is alert and oriented to person, place, and time. Mental status is at baseline.  Psychiatric:        Behavior: Behavior normal.     Comments: Well groomed, good eye contact, normal speech and thoughts        Results for orders placed or performed during the hospital encounter of 02/19/23  CBC  Result Value Ref Range   WBC 5.6 4.0 - 10.5 K/uL   RBC 3.12 (L) 4.22 - 5.81 MIL/uL   Hemoglobin 10.7 (L)  13.0 - 17.0 g/dL   HCT 62.1 (L) 30.8 - 65.7 %   MCV 103.5 (H) 80.0 - 100.0 fL   MCH 34.3 (H) 26.0 - 34.0 pg   MCHC 33.1 30.0 - 36.0 g/dL   RDW 84.6 96.2 - 95.2 %   Platelets 172 150 - 400 K/uL  nRBC 0.0 0.0 - 0.2 %  Brain natriuretic peptide  Result Value Ref Range   B Natriuretic Peptide 1,502.1 (H) 0.0 - 100.0 pg/mL  Comprehensive metabolic panel  Result Value Ref Range   Sodium 136 135 - 145 mmol/L   Potassium 5.4 (H) 3.5 - 5.1 mmol/L   Chloride 110 98 - 111 mmol/L   CO2 20 (L) 22 - 32 mmol/L   Glucose, Bld 133 (H) 70 - 99 mg/dL   BUN 83 (H) 8 - 23 mg/dL   Creatinine, Ser 1.30 (H) 0.61 - 1.24 mg/dL   Calcium 8.1 (L) 8.9 - 10.3 mg/dL   Total Protein 6.7 6.5 - 8.1 g/dL   Albumin 3.3 (L) 3.5 - 5.0 g/dL   AST 21 15 - 41 U/L   ALT 21 0 - 44 U/L   Alkaline Phosphatase 50 38 - 126 U/L   Total Bilirubin 0.6 0.3 - 1.2 mg/dL   GFR, Estimated 17 (L) >60 mL/min   Anion gap 6 5 - 15  Basic metabolic panel  Result Value Ref Range   Sodium 138 135 - 145 mmol/L   Potassium 4.7 3.5 - 5.1 mmol/L   Chloride 110 98 - 111 mmol/L   CO2 20 (L) 22 - 32 mmol/L   Glucose, Bld 64 (L) 70 - 99 mg/dL   BUN 78 (H) 8 - 23 mg/dL   Creatinine, Ser 8.65 (H) 0.61 - 1.24 mg/dL   Calcium 8.3 (L) 8.9 - 10.3 mg/dL   GFR, Estimated 17 (L) >60 mL/min   Anion gap 8 5 - 15  CBC  Result Value Ref Range   WBC 4.4 4.0 - 10.5 K/uL   RBC 2.88 (L) 4.22 - 5.81 MIL/uL   Hemoglobin 9.9 (L) 13.0 - 17.0 g/dL   HCT 78.4 (L) 69.6 - 29.5 %   MCV 99.7 80.0 - 100.0 fL   MCH 34.4 (H) 26.0 - 34.0 pg   MCHC 34.5 30.0 - 36.0 g/dL   RDW 28.4 13.2 - 44.0 %   Platelets 173 150 - 400 K/uL   nRBC 0.0 0.0 - 0.2 %  Glucose, capillary  Result Value Ref Range   Glucose-Capillary 163 (H) 70 - 99 mg/dL  Glucose, capillary  Result Value Ref Range   Glucose-Capillary 156 (H) 70 - 99 mg/dL  Basic metabolic panel  Result Value Ref Range   Sodium 140 135 - 145 mmol/L   Potassium 4.7 3.5 - 5.1 mmol/L   Chloride 107 98 - 111  mmol/L   CO2 25 22 - 32 mmol/L   Glucose, Bld 125 (H) 70 - 99 mg/dL   BUN 83 (H) 8 - 23 mg/dL   Creatinine, Ser 1.02 (H) 0.61 - 1.24 mg/dL   Calcium 8.0 (L) 8.9 - 10.3 mg/dL   GFR, Estimated 16 (L) >60 mL/min   Anion gap 8 5 - 15  Glucose, capillary  Result Value Ref Range   Glucose-Capillary 120 (H) 70 - 99 mg/dL  Glucose, capillary  Result Value Ref Range   Glucose-Capillary 151 (H) 70 - 99 mg/dL  Magnesium  Result Value Ref Range   Magnesium 2.4 1.7 - 2.4 mg/dL  Glucose, capillary  Result Value Ref Range   Glucose-Capillary 170 (H) 70 - 99 mg/dL  CBC  Result Value Ref Range   WBC 4.5 4.0 - 10.5 K/uL   RBC 2.85 (L) 4.22 - 5.81 MIL/uL   Hemoglobin 9.8 (L) 13.0 - 17.0 g/dL   HCT 72.5 (L) 36.6 - 44.0 %  MCV 100.0 80.0 - 100.0 fL   MCH 34.4 (H) 26.0 - 34.0 pg   MCHC 34.4 30.0 - 36.0 g/dL   RDW 13.0 86.5 - 78.4 %   Platelets 166 150 - 400 K/uL   nRBC 0.0 0.0 - 0.2 %  Basic metabolic panel  Result Value Ref Range   Sodium 139 135 - 145 mmol/L   Potassium 4.3 3.5 - 5.1 mmol/L   Chloride 105 98 - 111 mmol/L   CO2 25 22 - 32 mmol/L   Glucose, Bld 150 (H) 70 - 99 mg/dL   BUN 81 (H) 8 - 23 mg/dL   Creatinine, Ser 6.96 (H) 0.61 - 1.24 mg/dL   Calcium 8.3 (L) 8.9 - 10.3 mg/dL   GFR, Estimated 17 (L) >60 mL/min   Anion gap 9 5 - 15  Glucose, capillary  Result Value Ref Range   Glucose-Capillary 166 (H) 70 - 99 mg/dL  Glucose, capillary  Result Value Ref Range   Glucose-Capillary 145 (H) 70 - 99 mg/dL  Glucose, capillary  Result Value Ref Range   Glucose-Capillary 195 (H) 70 - 99 mg/dL  Basic metabolic panel  Result Value Ref Range   Sodium 136 135 - 145 mmol/L   Potassium 4.4 3.5 - 5.1 mmol/L   Chloride 103 98 - 111 mmol/L   CO2 25 22 - 32 mmol/L   Glucose, Bld 170 (H) 70 - 99 mg/dL   BUN 80 (H) 8 - 23 mg/dL   Creatinine, Ser 2.95 (H) 0.61 - 1.24 mg/dL   Calcium 8.1 (L) 8.9 - 10.3 mg/dL   GFR, Estimated 17 (L) >60 mL/min   Anion gap 8 5 - 15  Glucose, capillary   Result Value Ref Range   Glucose-Capillary 215 (H) 70 - 99 mg/dL  Glucose, capillary  Result Value Ref Range   Glucose-Capillary 150 (H) 70 - 99 mg/dL  Glucose, capillary  Result Value Ref Range   Glucose-Capillary 181 (H) 70 - 99 mg/dL  Glucose, capillary  Result Value Ref Range   Glucose-Capillary 249 (H) 70 - 99 mg/dL  CBG monitoring, ED  Result Value Ref Range   Glucose-Capillary 112 (H) 70 - 99 mg/dL  CBG monitoring, ED  Result Value Ref Range   Glucose-Capillary 162 (H) 70 - 99 mg/dL  CBG monitoring, ED  Result Value Ref Range   Glucose-Capillary 63 (L) 70 - 99 mg/dL  CBG monitoring, ED  Result Value Ref Range   Glucose-Capillary 104 (H) 70 - 99 mg/dL  CBG monitoring, ED  Result Value Ref Range   Glucose-Capillary 211 (H) 70 - 99 mg/dL  ECHOCARDIOGRAM COMPLETE  Result Value Ref Range   Weight 3,424 oz   Height 68 in   BP 172/78 mmHg   S' Lateral 3.70 cm   Area-P 1/2 4.15 cm2   AV Area VTI 2.26 cm2   AR max vel 2.26 cm2   AV Area mean vel 2.08 cm2   Est EF 50 - 55%    Ao pk vel 1.52 m/s   AV Peak grad 9.2 mmHg   AV Mean grad 5.0 mmHg  Troponin I (High Sensitivity)  Result Value Ref Range   Troponin I (High Sensitivity) 17 <18 ng/L  Troponin I (High Sensitivity)  Result Value Ref Range   Troponin I (High Sensitivity) 18 (H) <18 ng/L      Assessment & Plan:   Problem List Items Addressed This Visit     Acute on chronic heart failure with preserved ejection  fraction (HFpEF) (HCC) - Primary   DM type 2 with diabetic peripheral neuropathy (HCC)   Relevant Medications   GVOKE HYPOPEN 2-PACK 1 MG/0.2ML SOAJ    HFU Acute Diastolic CHF, HFpEF Appears euvolemic, he is down in fluid weight now after diuresis Improved today  Continue Lasix 40mg  daily, please review fluid discussion with Cardiology office on 03/05/23 next week  Keep up with Nephrology Kidney specialist  Last check the kidney function had improved  Continue Lantus 40 unit daily, improve  diet for low carb low sugar.  I am hesitant to keep raising Lantus due to risk of Hypoglycemia with kidney function  GVoke rescue pen for HYPOglycemia, low sugar < 60, and feel sick. Use this only for emergency.  Keep up with apts.  Meds ordered this encounter  Medications   GVOKE HYPOPEN 2-PACK 1 MG/0.2ML SOAJ    Sig: Inject 1 mg into the skin as needed (hypoglycemia).    Dispense:  1 mL    Refill:  2    Follow up plan: Return in about 9 weeks (around 05/02/2023) for 2+ months for DM A1c, HTN, CHF follow-up.   [name], DO [facility] 02/28/2023, 2:29 PM

## 2023-02-28 NOTE — Transitions of Care (Post Inpatient/ED Visit) (Signed)
   02/28/2023  Name: [name] MRN: [id number] DOB: 10-Apr-1961  Today's TOC FU Call Status:    Attempted to reach the patient regarding the most recent Inpatient/ED visit.  Spoke with patient wife who request RNCM call back at 11 am today.    Follow Up Plan: Additional outreach attempts will be made to reach the patient to complete the Transitions of Care (Post Inpatient/ED visit) call.   TOC call scheduled for 11 am on 02/28/2023.    Madeeha Costantino J. Cristela Felt, RN, BSN, MSN Care Management Coordinator/ Phone Number:  [phone]

## 2023-03-01 ENCOUNTER — Encounter: Payer: Self-pay | Admitting: Family Medicine

## 2023-03-03 ENCOUNTER — Other Ambulatory Visit: Payer: Medicaid Other

## 2023-03-03 ENCOUNTER — Telehealth: Payer: Self-pay

## 2023-03-03 NOTE — Transitions of Care (Post Inpatient/ED Visit) (Signed)
   03/03/2023  Name: TAUREN ELENES MRN: [id number] DOB: [birth year]  Today's TOC FU Call Status: Today's TOC FU Call Status:: Unsuccessful Call (3rd Attempt) Unsuccessful Call (3rd Attempt) Date: 03/03/23  Attempted to reach the patient regarding the most recent Inpatient/ED visit.  Follow Up Plan: No further outreach attempts will be made at this time. We have been unable to contact the patient.  Jareth Pardee J. Cristela Felt, RN, BSN, MSN Care Management Coordinator/[place] Phone Number:  [phone]

## 2023-03-04 ENCOUNTER — Encounter: Payer: Medicaid Other | Attending: Physician Assistant | Admitting: Physician Assistant

## 2023-03-04 DIAGNOSIS — E11621 Type 2 diabetes mellitus with foot ulcer: Secondary | ICD-10-CM | POA: Insufficient documentation

## 2023-03-04 DIAGNOSIS — L97512 Non-pressure chronic ulcer of other part of right foot with fat layer exposed: Secondary | ICD-10-CM | POA: Diagnosis not present

## 2023-03-04 DIAGNOSIS — I129 Hypertensive chronic kidney disease with stage 1 through stage 4 chronic kidney disease, or unspecified chronic kidney disease: Secondary | ICD-10-CM | POA: Insufficient documentation

## 2023-03-04 DIAGNOSIS — N184 Chronic kidney disease, stage 4 (severe): Secondary | ICD-10-CM | POA: Diagnosis not present

## 2023-03-04 DIAGNOSIS — E1122 Type 2 diabetes mellitus with diabetic chronic kidney disease: Secondary | ICD-10-CM | POA: Diagnosis not present

## 2023-03-04 DIAGNOSIS — E1151 Type 2 diabetes mellitus with diabetic peripheral angiopathy without gangrene: Secondary | ICD-10-CM | POA: Diagnosis not present

## 2023-03-05 ENCOUNTER — Encounter: Payer: Self-pay | Admitting: Family

## 2023-03-05 ENCOUNTER — Ambulatory Visit
Admission: RE | Admit: 2023-03-05 | Discharge: 2023-03-05 | Disposition: A | Discharge status: Home / Self Care | Payer: Medicaid Other | Source: Ambulatory Visit | Attending: Physician Assistant | Admitting: Physician Assistant

## 2023-03-05 ENCOUNTER — Encounter: Payer: Self-pay | Admitting: Pharmacist

## 2023-03-05 ENCOUNTER — Other Ambulatory Visit: Payer: Self-pay | Admitting: Physician Assistant

## 2023-03-05 ENCOUNTER — Ambulatory Visit: Payer: Medicaid Other | Admitting: Family

## 2023-03-05 VITALS — BP 142/77 | HR 57 | Wt 211.0 lb

## 2023-03-05 DIAGNOSIS — I10 Essential (primary) hypertension: Secondary | ICD-10-CM

## 2023-03-05 DIAGNOSIS — S91301A Unspecified open wound, right foot, initial encounter: Secondary | ICD-10-CM | POA: Diagnosis not present

## 2023-03-05 DIAGNOSIS — I5032 Chronic diastolic (congestive) heart failure: Secondary | ICD-10-CM

## 2023-03-05 DIAGNOSIS — I2510 Atherosclerotic heart disease of native coronary artery without angina pectoris: Secondary | ICD-10-CM

## 2023-03-05 DIAGNOSIS — G4733 Obstructive sleep apnea (adult) (pediatric): Secondary | ICD-10-CM

## 2023-03-05 DIAGNOSIS — E1169 Type 2 diabetes mellitus with other specified complication: Secondary | ICD-10-CM

## 2023-03-05 DIAGNOSIS — E1142 Type 2 diabetes mellitus with diabetic polyneuropathy: Secondary | ICD-10-CM

## 2023-03-05 DIAGNOSIS — J449 Chronic obstructive pulmonary disease, unspecified: Secondary | ICD-10-CM

## 2023-03-05 DIAGNOSIS — N184 Chronic kidney disease, stage 4 (severe): Secondary | ICD-10-CM | POA: Insufficient documentation

## 2023-03-05 DIAGNOSIS — M869 Osteomyelitis, unspecified: Secondary | ICD-10-CM | POA: Diagnosis not present

## 2023-03-05 DIAGNOSIS — M7989 Other specified soft tissue disorders: Secondary | ICD-10-CM | POA: Diagnosis not present

## 2023-03-05 DIAGNOSIS — E119 Type 2 diabetes mellitus without complications: Secondary | ICD-10-CM | POA: Diagnosis not present

## 2023-03-05 DIAGNOSIS — Z89411 Acquired absence of right great toe: Secondary | ICD-10-CM | POA: Diagnosis not present

## 2023-03-05 MED ORDER — VALSARTAN 80 MG PO TABS: 80.0000 mg | ORAL_TABLET | Freq: Every day | ORAL | 3 refills | Status: DC

## 2023-03-05 MED ORDER — HYDRALAZINE HCL 100 MG PO TABS: 100.0000 mg | ORAL_TABLET | Freq: Two times a day (BID) | ORAL | Status: DC

## 2023-03-05 NOTE — Patient Instructions (Signed)
STOP Losartan  START Valsartan 80mg  daily  TAKE Hydralazine twice daily  Labs in 1 week  Follow up in 1 month  Do the following things EVERYDAY: Weigh yourself in the morning before breakfast. Write it down and keep it in a log. Take your medicines as prescribed Eat low salt foods--Limit salt (sodium) to 2000 mg per day.  Stay as active as you can everyday Limit all fluids for the day to less than 2 liters

## 2023-03-05 NOTE — Progress Notes (Signed)
ADVANCED HF CLINIC NOTE  Referring Physician: Lorine Bears, MD Primary Care: Smitty Cords, DO (last seen 08/24) Primary Cardiologist: Lorine Bears, MD (last seen 12/23) HF provider: Arvilla Meres, MD (last seen 06/24)   HPI:  [name] is a 62 y.o. male with history of hyperlipidemia, CAD w/ PCI and DES 10/2016, chronic systolic HF, DM2, HTN, CKD IV, ETOH abuse and PAD.   He is s/p drug-eluting stent placement to the LAD 4/818 in setting of acute HF.  At that time, he was found to have severe LV dysfunction with an EF 25-30% which was felt to be out of proportion to the LAD disease.  He also has a history of alcohol abuse. F/u echo 2018 EF 40-45%.    Admitted 02/19/23 due to acute on chronic heart failure. Cardiology and nephrology consults done. IV diuresing held. Antibiotic given for foot infection.   Echo 08/28/14: EF 40-45% with Grade I DD and mild MR Echo 08/16/20: EF 45-50%.  There was concern about possible cardiac amyloidosis.  PYP scan was equivocal for amyloidosis  Echo 08/08/22 EF 55-60%  Echo 02/19/23: EF 50-55% with mild LVH, Grade II DD and mild/ moderate MR  LHC 10/21/16:  Ost 2nd Diag to 2nd Diag lesion, 60 %stenosed. Mid Cx lesion, 50 %stenosed. Prox RCA lesion, 40 %stenosed. Mid RCA lesion, 20 %stenosed. A STENT XIENCE ALPINE RX 3.5X15 drug eluting stent was successfully placed. Mid LAD lesion, 90 %stenosed. Post intervention, there is a 0% residual stenosis.  1. Severe one-vessel coronary artery disease involving mid LAD. 2. Severely reduced LV systolic function by echo. Left ventricular angiography was not performed. LVEDP was only mildly elevated at 15 mmHg. 3. Successful angioplasty and drug-eluting stent placement to the mid LAD.  PFTs 2/24: FEV1 2.44 (71%) FVC 3.62 (79%) Ratio 67% DLCO 60% c/w mild obstruction  He presents today for a HF f/u visit with a chief complaint of minimal fatigue with moderate exertion. Chronic in exertion.  Has associated dizziness with sudden position changes and dry mouth along with this. Denies SOB, chest pain, cough, palpitations, abdominal distention, pedal edema, weight gain or difficulty sleeping. Has healing wound on the bottom of his right food and he's currently going to wound center for this.  Admits that he generally takes his hydralazine BID as he tends to forget the noon dose. Has been having some high glucose readings after he eats supper and then takes his lantus at bedtime and then gets glucose in the 50's in the morning.   ROS: All systems negative except as listed in HPI, PMH and Problem List.  SH:  Social History   Socioeconomic History   Marital status: Married    Spouse name: Not on file   Number of children: Not on file   Years of education: Not on file   Highest education level: Not on file  Occupational History   Not on file  Tobacco Use   Smoking status: Never   Smokeless tobacco: Never  Vaping Use   Vaping status: Never Used  Substance and Sexual Activity   Alcohol use: Yes    Comment: occassionally   Drug use: No   Sexual activity: Not Currently  Other Topics Concern   Not on file  Social History Narrative   Not on file   Social Determinants of Health   Financial Resource Strain: Not on file  Food Insecurity: No Food Insecurity (02/20/2023)   Hunger Vital Sign    Worried About Running  Out of Food in the Last Year: Never true    Ran Out of Food in the Last Year: Never true  Transportation Needs: No Transportation Needs (02/20/2023)   PRAPARE - Administrator, Civil Service (Medical): No    Lack of Transportation (Non-Medical): No  Physical Activity: Not on file  Stress: Not on file  Social Connections: Not on file  Intimate Partner Violence: Not At Risk (02/20/2023)   Humiliation, Afraid, Rape, and Kick questionnaire    Fear of Current or Ex-Partner: No    Emotionally Abused: No    Physically Abused: No    Sexually Abused: No    FH:   Family History  Problem Relation Age of Onset   Hypertension Mother    Heart disease Maternal Grandfather     Past Medical History:  Diagnosis Date   Ankle pain    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 10/2016 Echo: EF 25-30%, diff HK; b. 01/2017 Echo:  EF 40-45%, GrI DD; c. 08/2017 Echo: EF 40-45%, GrI DD; d. 08/2020 Echo: EF 45-50%, glob HK. Mod asymm LVH, GrI DD, Nl RV size/fxn; e. 08/2020 PYP: equivocal for ATTR cardiac amyloid.   CKD (chronic kidney disease), stage III - IV (HCC)    Coronary artery disease    a. 10/2016 Cath/PCI: LAD 11m (3.5x15 Xience Alpine DES). No other obstructive disease.   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 10/2016 Echo: EF 25-30%, diff HK; b. 01/2017 Echo:  EF 40-45%, GrI DD; c. 08/2017 Echo: EF 40-45%, GrI DD; d. 08/2020 Echo: EF 45-50%, glob HK. Mod asymm LVH, GrI DD, Nl RV size/fxn.   Pain in both feet     Current Outpatient Medications  Medication Sig Dispense Refill   ACCU-CHEK FASTCLIX LANCETS MISC Check sugar up to 3 x daily as instructed 102 each 12   ACCU-CHEK GUIDE test strip Check blood sugar up to 3 times daily as advised 100 each 12   acetaminophen (TYLENOL 8 HOUR) 650 MG CR tablet Take 1 tablet (650 mg total) by mouth every 8 (eight) hours as needed for pain.     aspirin EC 81 MG tablet Take 81 mg by mouth daily.     atorvastatin (LIPITOR) 40 MG tablet TAKE 1 TABLET BY MOUTH EVERY DAY 90 tablet 1   carvedilol (COREG) 6.25 MG tablet Take 1 tablet (6.25 mg total) by mouth 2 (two) times daily with a meal. 60 tablet 1   cloNIDine (CATAPRES) 0.1 MG tablet Take 1 tablet (0.1 mg total) by mouth 2 (two) times daily. 60 tablet 1   doxazosin (CARDURA) 1 MG tablet TAKE 2 TABLETS (2 MG TOTAL) BY MOUTH AT BEDTIME. 180 tablet 1   furosemide (LASIX) 40 MG tablet Take 1 tablet (40 mg total) by mouth daily. 30 tablet  1   gabapentin (NEURONTIN) 400 MG capsule Take 1 capsule (400 mg total) by mouth 2 (two) times daily. 360 capsule 1   GVOKE HYPOPEN 2-PACK 1 MG/0.2ML SOAJ Inject 1 mg into the skin as needed (hypoglycemia). 1 mL 2   hydrALAZINE (APRESOLINE) 100 MG tablet TAKE 1 TABLET BY MOUTH THREE TIMES A DAY 270 tablet 1   Insulin Pen Needle (B-D ULTRAFINE III SHORT PEN) 31G X 8 MM MISC USE TO INJECT INSULIN NIGHTLY 100 each 6   isosorbide mononitrate (IMDUR) 60 MG  24 hr tablet TAKE 1 TABLET BY MOUTH 2 TIMES DAILY. 180 tablet 1   Lancets Misc. MISC Use  Brand compatable to insurance and monitor to check blood sugar up to 3 times daily. ICD10 E11.9 100 each 12   LANTUS SOLOSTAR 100 UNIT/ML Solostar Pen INJECT 40 UNITS INTO THE SKIN AT BEDTIME. 45 mL 1   losartan (COZAAR) 25 MG tablet Take 25 mg by mouth daily.     No current facility-administered medications for this visit.   Vitals:   03/05/23 0859  BP: (!) 142/77  Pulse: (!) 57  SpO2: 97%  Weight: 211 lb (95.7 kg)   Wt Readings from Last 3 Encounters:  03/05/23 211 lb (95.7 kg)  02/28/23 209 lb (94.8 kg)  02/19/23 214 lb (97.1 kg)   Lab Results  Component Value Date   CREATININE 3.73 (H) 02/23/2023   CREATININE 3.83 (H) 02/22/2023   CREATININE 4.08 (H) 02/21/2023   PHYSICAL EXAM:  General:  Well appearing. No resp difficulty HEENT: normal Neck: supple. JVP flat. No lymphadenopathy or thryomegaly appreciated. Cor: PMI normal. Regular rate & rhythm. No rubs, gallops or murmurs. Lungs: clear Abdomen: soft, nontender, nondistended. No hepatosplenomegaly. No bruits or masses.  Extremities: no cyanosis, clubbing, rash, trace pedal edema bilaterally Neuro: alert & oriented x3, cranial nerves grossly intact. Moves all 4 extremities w/o difficulty. Affect pleasant.   ECG: 02/20/23 showed SB with LBBB   ASSESSMENT & PLAN:  1.  Ischemic cardiomyopathy with preserved ejection fraction-  - status post LAD stenting in April 2018 - Stable NYHA  II - euvolemic - weighing daily; reminded to call for overnight weight gain of > 2 pounds or a weekly weight gain of > 5 pounds - weight stable from last visit 2 months ago - Echo 08/28/14: EF 40-45% with Grade I DD and mild MR - Echo 08/16/20: EF 45-50%.  There was concern about possible cardiac amyloidosis.   - Echo 08/08/22 EF 55-60%  - Echo 02/19/23: EF 50-55% with mild LVH, Grade II DD and mild/ moderate MR - PYP 2/22 reviewed. H/CL 1.17 Negative for amyloid - for HF benefit, will stop losartan and change to valsartan 80mg  daily - BMP in 1 week - continue lasix 40mg  daily - continue hydralazine 100 bid (regularly forgets noon dose) and imdur 60 daily - continue carvedilol 6.25mg  BID - not on Entresto, farxiga or spironolactone due to CKD (eGFR 16) - Jardiance was associated with generalized rash.  - BNP 02/19/23 was 1502.1 - PharmD reconciled meds w/ patient  2.  Coronary artery disease-  - Status post LAD stenting in April 2018.  - saw cardiology Kirke Corin) 12/23 - not candidate for repeat cath with CKD IV - continue ASA 81mg  daily - continue atorvastatin 40mg  daily - LDL 01/30/23 was 71 - LHC 10/21/16:  Ost 2nd Diag to 2nd Diag lesion, 60 %stenosed. Mid Cx lesion, 50 %stenosed. Prox RCA lesion, 40 %stenosed. Mid RCA lesion, 20 %stenosed. A STENT XIENCE ALPINE RX 3.5X15 drug eluting stent was successfully placed. Mid LAD lesion, 90 %stenosed. Post intervention, there is a 0% residual stenosis.  1. Severe one-vessel coronary artery disease involving mid LAD. 2. Severely reduced LV systolic function by echo. Left ventricular angiography was not performed. LVEDP was only mildly elevated at 15 mmHg. 3. Successful angioplasty and drug-eluting stent placement to the mid LAD.   3.  Essential hypertension-  - BP 142/77 - saw PCP Althea Charon) 08/24 - continue clonidine 0.1mg  BID but hope to be able to  wean him off if we get better BP control with valsartan; getting him off clonidine would  also lessen his dry mouth - continue cardura 2mg  daily but would also like to try and get him off this in the future - BMP 02/26/23 showed sodium 137, potassium 4.2, creatinine 4.09 & GFR 16 - BMP in 1 week  4. DM2- - per PCP - A1c 01/30/23 was 8.6% - can move up his lantus insulin to before supper time and see if this helps diminish his highs/ lows that he's been getting - may benefit from endo referral    5.  Stage IV chronic kidney disease- - CKD IV baseline Scr 3.1-3.3 - current eGFR too low for farxiga - saw nephrology Cherylann Ratel) 08/24  6. COPD- - PFTs 2/24: FEV1 2.44 (71%) FVC 3.62 (79%) Ratio 67% DLCO 60% c/w mild obstruction - had upcoming pulmonology appt cancelled due to conflict  7. OSA- - sleep study done 01/20/23 and showed severe sleep apnea with REI of 46.5/ hour - patient says today that he has no desire to wear CPAP equipment - continue to discuss   Return in 1 month, sooner if needed.

## 2023-03-06 ENCOUNTER — Institutional Professional Consult (permissible substitution): Payer: Medicaid Other | Admitting: Pulmonary Disease

## 2023-03-07 NOTE — Progress Notes (Signed)
HAROL, MASONER X ([id number]) [id number] Page 1 of 11 Visit Report for 03/04/2023 Chief Complaint Document Details Patient Name: Date of Service: [name] [facility] M. 03/04/2023 2:00 PM Medical Record Number: [id number] Patient Account Number: [id number] Date of Birth/Sex: Treating RN: Jul 23, 1960 (62 y.o. [name] Primary Care Provider: [name] Other Clinician: Referring Provider: Treating Provider/Extender: Marijo Sanes Weeks in Treatment: 0 Information Obtained from: Patient Chief Complaint Right plantar foot ulcer Electronic Signature(s) Signed: 03/04/2023 3:06:27 PM By: Allen Derry PA-C Entered By: Allen Derry on 03/04/2023 12:06:27 -------------------------------------------------------------------------------- Debridement Details Patient Name: Date of Service: [name] HN M. 03/04/2023 2:00 PM Medical Record Number: [id number] Patient Account Number: [id number] Date of Birth/Sex: Treating RN: 1960/11/13 (62 y.o. [name] Primary Care Provider: [name] Other Clinician: Referring Provider: Treating Provider/Extender: Carita Pian, JEREMY Weeks in Treatment: 0 Debridement Performed for Assessment: Wound #2 Right,Plantar Foot Performed By: Physician Allen Derry, PA-C Debridement Type: Debridement Severity of Tissue Pre Debridement: Fat layer exposed Level of Consciousness (Pre-procedure): Awake and Alert Pre-procedure Verification/Time Out Yes - 15:10 Taken: Start Time: 15:10 Pain Control: Lidocaine 4% T opical Solution Percent of Wound Bed Debrided: 100% T Area Debrided (cm): otal 0.03 Tissue and other material debrided: Viable, Non-Viable, Callus, Slough, Subcutaneous, Slough Level: Skin/Subcutaneous Tissue Debridement Description: Excisional Instrument: Curette Bleeding: Minimum Hemostasis Achieved: Pressure Procedural Pain: 0 Post Procedural PainDONTAVION, [name]  ([id number]) [id number] Page 2 of 11 Response to Treatment: Procedure was tolerated well Level of Consciousness (Post- Awake and Alert procedure): Post Debridement Measurements of Total Wound Length: (cm) 0.2 Width: (cm) 0.2 Depth: (cm) 0.2 Volume: (cm) 0.006 Character of Wound/Ulcer Post Debridement: Stable Severity of Tissue Post Debridement: Fat layer exposed Post Procedure Diagnosis Same as Pre-procedure Electronic Signature(s) Signed: 03/04/2023 6:06:45 PM By: Allen Derry PA-C Signed: 03/07/2023 1:24:46 PM By: Midge Aver MSN RN CNS WTA Entered By: Allen Derry on 03/04/2023 15:06:45 -------------------------------------------------------------------------------- HPI Details Patient Name: Date of Service: [name], JO HN M. 03/04/2023 2:00 PM Medical Record Number: [id number] Patient Account Number: [id number] Date of Birth/Sex: Treating RN: [birth year] (62 y.o. [name] Primary Care Provider: [name] Other Clinician: Referring Provider: Treating Provider/Extender: Carita Pian, JEREMY Weeks in Treatment: 0 History of Present Illness Chronic/Inactive Conditions Condition 1: 03-04-2023 patient's arterial screening today actually appears to show some signs of being somewhat low at 0.83 on the left 1.53 on the right which is essentially noncompressible. I really do not think we can assure ourselves of these readings and I think he really should see vascular for a thorough arterial evaluation in order to ensure that he has good blood flow although I suspect he probably does. HPI Description: 07/20/17 on evaluation today patient actually appears to be doing somewhat poorly in regard to his right plantar great toe ulcer. This is something that he states started as what he thought to be a blister May 04, 2018. Subsequently he treated this on his own for a while thinking that it would just get better. As it did not get better he  eventually did go to have this evaluated where it was treated appropriately with it sounds like Bactroban ointment however the patient states that it never completely resolved. Subsequently if this continue to get worse and was looking more irritated and inflamed he did go to the hospital more recently where he was prescribed Keflex. Subsequently he was also referred to the wound care center here for further evaluation  by [place]. He had an x-ray in the ER which revealed no evidence of osteomyelitis that I explained to the patient that osteomyelitis can be present [name] in a negative x- ray as it's not nearly as sensitive as an MRI would be. No fevers, chills, nausea, or vomiting noted at this time. The patient tells me that this might look a little bit better since he started the antibiotics. He does have a history of diabetes mellitus type II he also had a horse accident years ago and had to have significant surgery in regard to his right lower extremity that did leave some deformity in this regard. This is mainly centered in the shin region. 07/27/18 upon evaluation today patient appears to be doing fairly well in regard to his toe ulcer all things considered. 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 This obviously is good news. With that being said I do think that he did have some infection which obviously calls some of the necrosis although the wound seems to be doing better he is definitely tolerating the Cipro which I switched into based on the bone culture and seems to be dramatically improving in this regard. Overall I'm pleased with how things stand. 08/03/18 on evaluation today patient appears to be doing rather well in regard to his toe ulcer all things considering. Again at this point the patient's wound shows signs of improvement although this is slow it  still seems to be doing very well in my pinion. Fortunately there does not seem to show any signs of infection as far as the overall appearance of the wound bed is concerned at least nothing worsening. Overall the redness and erythema has dramatically improved. I'm very pleased. Patient does have his infectious disease consult on February 3 and his MRI is actually scheduled for the 23rd of this month which is just three days away. 08/10/18 on evaluation today patient actually appears to be doing okay in regard to his toe ulcer. It does not appear to be as inflamed as it was previous. 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 He still has bone exposed in the base of the wound. I am concerned about how well this is actually going to heal or not at this point. Overall I do believe that the Cipro has been of benefit for him and I will continue this one more week until he sees infectious disease next Monday. I'm also gonna see about however getting appointment for him to see podiatry to discuss his options. 08/18/18 on evaluation today patient's wound actually appears to be doing decently well. He does have bone noted at this point in the base of the wound still [name], [name] ([id number]) [id number] Page 3 of 11 although there does not appear to be as much necrotic bone as there was previous. He has been tolerating the Cipro without any complications. He did subsequently have an appointment with infectious disease which was supposed to be yesterday. However they called him when he was getting the car to leave to go in order to tell him that they would not be able to see  him due to the doctor being out and rescheduled him for the 24th which is three  weeks away. T be o honest I'm not very pleased with that scenario. Nonetheless the patient tells me as well today that he is not [name] sure that he would want to go see a podiatrist at this point he really wants to do whatever he can to avoid surgery. 08/25/18 on evaluation today patient's wound continues to show signs of necrotic bone in the base of the wound as well is necrotic tissue in general. He does still have your theme of the great toe he's currently on the oral antibiotics while we are still awaiting the referral to infectious disease which is scheduled for the 24th of this month. At this point I did have a longer conversation today with him concerning hyperbaric option therapy and how considering he doesn't want to have any surgery wants to try to manage this medically it could potentially help in getting this to resolve. Fortunately there does not appear to be any signs of infection at this time systemically. No fevers, chills, nausea, or vomiting noted at this time. He does have a history of having had some heart disease with what sounds to be potentially stand I would have to get an updated EKG prior to HBO therapy. Nonetheless he is not a smoker and has no lung conditions. I'm not sure if you set in echo but if not we may have to see about this as well prior to initiating therapy to ensure his ejection fraction is sufficient. I will look further into this as well. 09/01/18 on evaluation today patient's toe actually appears to be doing about the same may be slightly better in regard to the overall quality of the wound bed although he still has bone exposed some of this will still requiring debridement today due to be necrotic on the tip. Fortunately there's no signs of worsening infection. We did get approval for hyperbaric oxygen therapy for the patient although he states that he thought about it and really does not want to do that. He would like to see infectious disease first at least  and he has an appointment with them on Monday. Subsequently following that appointment I'll see him on Tuesday and then we'll have a longer discussion about what we're gonna do. His toe again also showed a pathologic fracture and I think this is also a big part of the issue as well. Again as I explained to the patient today and have previous I think that the hyperbaric oxygen therapy would be of great benefit for him as far as trying to clear out the infection especially in light of the fact that he really doesn't seem to be progressing quite as well as I would like is still has necrotic bone noted in the base of the wound. Obviously IV antibiotics will help as well I have referred him to fix disease unfortunately the beginning of month is appointment got pushed to the end of this month that actually is on Monday. Subsequently we're gonna see were things stand once they see him. Readmission: 03-04-2023 upon evaluation today patient appears to be doing somewhat poorly today in regard to the wound on the right plantar foot. 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 With that being said the patient tells me at  this point that he has been treating this just at home using some Betadine on the area but really nothing else has been done up to this point. This is actually his first office for his care is concerned in a clinic setting. Patient's past medical history actually is very similar to previous when I saw him. He does have a history of diabetes mellitus type 2, peripheral vascular disease, chronic kidney disease stage IV, hypertension, he has not had any formal arterial testing up to this point. Patient is likely going require an x-ray today. Electronic Signature(s) Signed: 03/04/2023 6:05:08 PM By: Allen Derry PA-C Previous Signature: 03/04/2023 6:04:55  PM Version By: Allen Derry PA-C Entered By: Allen Derry on 03/04/2023 15:05:08 -------------------------------------------------------------------------------- Physical Exam Details Patient Name: Date of Service: [name] HN M. 03/04/2023 2:00 PM Medical Record Number: [id number] Patient Account Number: [id number] Date of Birth/Sex: Treating RN: 1960-12-21 (62 y.o. [name] Primary Care Provider: [name] Other Clinician: Referring Provider: Treating Provider/Extender: Carita Pian, JEREMY Weeks in Treatment: 0 Constitutional sitting or standing blood pressure is within target range for patient.. pulse regular and within target range for patient.[name] respirations regular, non-labored and within target range for patient.[name] temperature within target range for patient.. Well-nourished and well-hydrated in no acute distress. Eyes conjunctiva clear no eyelid edema noted. pupils equal round and reactive to light and accommodation. Ears, Nose, Mouth, and Throat no gross abnormality of ear auricles or external auditory canals. normal hearing noted during conversation. mucus membranes moist. Respiratory normal breathing without difficulty. Cardiovascular 1+ dorsalis pedis/posterior tibialis pulses. no clubbing, cyanosis, significant edema, <3 sec cap refill. Musculoskeletal normal gait and posture. no significant deformity or arthritic changes, no loss or range of motion, no clubbing. [name], [name] ([id number]) [id number] Page 4 of 11 Psychiatric this patient is able to make decisions and demonstrates good insight into disease process. Alert and Oriented x 3. pleasant and cooperative. Notes Upon inspection patient's wound bed actually showed signs of need for sharp debridement I actually did perform debridement to clean away some of the callus to expose the actual wound. Also clean the surface of wound to clear away the necrotic debris.  Again all this was done without any complication the patient visually probably has good arterial flow but again we cannot confirm that everything looks good on testing prior to making assumptions here. He is in agreement with this plan. The good news is I do not see any signs of active infection at this time. I do believe an offloading shoe would be beneficial. Electronic Signature(s) Signed: 03/04/2023 6:05:47 PM By: Allen Derry PA-C Entered By: Allen Derry on 03/04/2023 15:05:47 -------------------------------------------------------------------------------- Physician Orders Details Patient Name: Date of Service: [name] HN M. 03/04/2023 2:00 PM Medical Record Number: [id number] Patient Account Number: [id number] Date of Birth/Sex: Treating RN: 13-May-1961 (62 y.o. [name] Primary Care Provider: [name] Other Clinician: Referring Provider: Treating Provider/Extender: Carita Pian, JEREMY Weeks in Treatment: 0 Verbal / Phone Orders: No Diagnosis Coding ICD-10 Coding Code Description E11.621 Type 2 diabetes mellitus with foot ulcer L97.512 Non-pressure chronic ulcer of other part of right foot with fat layer exposed I73.89 Other specified peripheral vascular diseases N18.4 Chronic kidney disease, stage 4 (severe) I10 Essential (primary) hypertension Follow-up Appointments Return Appointment in 1 week. Bathing/ Shower/ Hygiene May shower; gently cleanse wound with antibacterial soap, rinse and pat dry prior to dressing wounds Anesthetic (Use 'Patient Medications' Section for Anesthetic Order Entry) Lidocaine applied  to wound bed Off-Loading Open toe surgical shoe with peg assist. Wound Treatment Wound #2 - Foot Wound Laterality: Plantar, Right Cleanser: Soap and Water 3 x Per Week/30 Days Discharge Instructions: Gently cleanse wound with antibacterial soap, rinse and pat dry prior to dressing wounds Prim Dressing: Hydrofera Blue Ready Transfer Foam,  2.5x2.5 (in/in) (DME) (Dispense As Written) 3 x Per Week/30 Days ary Discharge Instructions: Apply Hydrofera Blue Ready to wound bed as directed Secondary Dressing: (BORDER) Zetuvit Plus SILICONE BORDER Dressing 4x4 (in/in) (DME) (Generic) 3 x Per Week/30 Days Discharge Instructions: Please do not put silicone bordered dressings under wraps. Use non-bordered dressing only. Radiology X-ray, foot - Right foot [name], [name] ([id number]) [id number] Page 5 of 11 Services and Therapies Ankle Brachial Index (ABI) Electronic Signature(s) Signed: 03/04/2023 6:45:54 PM By: Allen Derry PA-C Signed: 03/07/2023 1:24:46 PM By: Midge Aver MSN RN CNS WTA Entered By: Midge Aver on 03/04/2023 13:39:58 -------------------------------------------------------------------------------- Problem List Details Patient Name: Date of Service: [name] HN M. 03/04/2023 2:00 PM Medical Record Number: [id number] Patient Account Number: [id number] Date of Birth/Sex: Treating RN: [birth year] (61 y.o. [name] Primary Care Provider: [name] Other Clinician: Referring Provider: Treating Provider/Extender: Carita Pian, JEREMY Weeks in Treatment: 0 Active Problems ICD-10 Encounter Code Description Active Date MDM Diagnosis E11.621 Type 2 diabetes mellitus with foot ulcer 03/04/2023 No Yes L97.512 Non-pressure chronic ulcer of other part of right foot with fat layer exposed 03/04/2023 No Yes I73.89 Other specified peripheral vascular diseases 03/04/2023 No Yes N18.4 Chronic kidney disease, stage 4 (severe) 03/04/2023 No Yes I10 Essential (primary) hypertension 03/04/2023 No Yes Inactive Problems Resolved Problems Electronic Signature(s) Signed: 03/04/2023 4:45:33 PM By: Midge Aver MSN RN CNS WTA Signed: 03/04/2023 6:45:54 PM By: Allen Derry PA-C Previous Signature: 03/04/2023 3:06:05 PM Version By: Allen Derry PA-C Previous Signature: 03/04/2023 2:51:33 PM  Version By: Allen Derry PA-C Entered By: Midge Aver on 03/04/2023 13:45:32 [name] ([id number]) [id number] Page 6 of 11 -------------------------------------------------------------------------------- Progress Note Details Patient Name: Date of Service: Harold Mote. 03/04/2023 2:00 PM Medical Record Number: [id number] Patient Account Number: [id number] Date of Birth/Sex: Treating RN: Sep 06, 1960 (62 y.o. [name] Primary Care Provider: [name] Other Clinician: Referring Provider: Treating Provider/Extender: Marijo Sanes Weeks in Treatment: 0 Subjective Chief Complaint Information obtained from Patient Right plantar foot ulcer History of Present Illness (HPI) Chronic/Inactive Condition: 03-04-2023 patient's arterial screening today actually appears to show some signs of being somewhat low at 0.83 on the left 1.53 on the right which is essentially noncompressible. I really do not think we can assure ourselves of these readings and I think he really should see vascular for a thorough arterial evaluation in order to ensure that he has good blood flow although I suspect he probably does. 07/20/17 on evaluation today patient actually appears to be doing somewhat poorly in regard to his right plantar great toe ulcer. This is something that he states started as what he thought to be a blister May 04, 2018. Subsequently he treated this on his own for a while thinking that it would just get better. As it did not get better he eventually did go to have this evaluated where it was treated appropriately with it sounds like Bactroban ointment however the patient states that it never completely resolved. Subsequently if this continue to get worse and was looking more irritated and inflamed he did go to the hospital more recently where he was prescribed  Keflex. Subsequently he was also referred to the wound care center here  for further evaluation by [place]. He had an x-ray in the ER which revealed no evidence of osteomyelitis that I explained to the patient that osteomyelitis can be present [name] in a negative x-ray as it's not nearly as sensitive as an MRI would be. No fevers, chills, nausea, or vomiting noted at this time. The patient tells me that this might look a little bit better since he started the antibiotics. He does have a history of diabetes mellitus type II he also had a horse accident years ago and had to have significant surgery in regard to his right lower extremity that did leave some deformity in this regard. This is mainly centered in the shin region. 07/27/18 upon evaluation today patient appears to be doing fairly well in regard to his toe ulcer all things considered. 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 This obviously is good news. With that being said I do think that he did have some infection which obviously calls some of the necrosis although the wound seems to be doing better he is definitely tolerating the Cipro which I switched into based on the bone culture and seems to be dramatically improving in this regard. Overall I'm pleased with how things stand. 08/03/18 on evaluation today patient appears to be doing rather well in regard to his toe ulcer all things considering. Again at this point the patient's wound shows signs of improvement although this is slow it still seems to be doing very well in my pinion. Fortunately there does not seem to show any signs of infection as far as the overall appearance of the wound bed is concerned at least nothing worsening. Overall the redness and erythema has dramatically improved. I'm very pleased. Patient does have his infectious disease consult on February 3 and his MRI is actually scheduled for the 23rd of this month  which is just three days away. 08/10/18 on evaluation today patient actually appears to be doing okay in regard to his toe ulcer. It does not appear to be as inflamed as it was previous. 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 He still has bone exposed in the base of the wound. I am concerned about how well this is actually going to heal or not at this point. Overall I do believe that the Cipro has been of benefit for him and I will continue this one more week until he sees infectious disease next Monday. I'm also gonna see about however getting appointment for him to see podiatry to discuss his options. 08/18/18 on evaluation today patient's wound actually appears to be doing decently well. He does have bone noted at this point in the base of the wound still although there does not appear to be as much necrotic bone as there was previous. He has been tolerating the Cipro without any complications. He did subsequently have an appointment with infectious disease which was supposed to be yesterday. However they called him when he was getting the car to leave to go in order to tell him that they  would not be able to see him due to the doctor being out and rescheduled him for the 24th which is three weeks away. T be o honest I'm not very pleased with that scenario. Nonetheless the patient tells me as well today that he is not [name] sure that he would want to go see a podiatrist at this point he really wants to do whatever he can to avoid surgery. 08/25/18 on evaluation today patient's wound continues to show signs of necrotic bone in the base of the wound as well is necrotic tissue in general. He does still have your theme of the great toe he's currently on the oral antibiotics  while we are still awaiting the referral to infectious disease which is scheduled for the 24th of this month. At this point I did have a longer conversation today with him concerning hyperbaric option therapy and how considering he doesn't want to have any surgery wants to try to manage this medically it could potentially help in getting this to resolve. Fortunately there does not appear to be any signs of infection at this time systemically. No fevers, chills, nausea, or vomiting noted at this time. He does have a history of having had some heart disease with what sounds to be potentially stand I would have to get an updated EKG prior to HBO therapy. Nonetheless he is not a smoker and has no lung conditions. I'm not sure if you set in echo but if not we may have to see about this as well prior to initiating therapy to ensure his ejection fraction is sufficient. I will look further into this as well. 09/01/18 on evaluation today patient's toe actually appears to be doing about the same may be slightly better in regard to the overall quality of the wound bed although he still has bone exposed some of this will still requiring debridement today due to be necrotic on the tip. Fortunately there's no signs of worsening infection. We did get approval for hyperbaric oxygen therapy for the patient although he states that he thought about it and really does not want to do that. He would like to see infectious disease first at least and he has an appointment with them on Monday. Subsequently following that appointment I'll see him on Tuesday and then we'll have a longer discussion about what we're gonna do. His toe again also showed a pathologic fracture and I think this is also a big part of the issue as well. Again as I explained to the patient today and have previous I think that the hyperbaric oxygen therapy would be of great benefit for him as far as trying to clear out the infection especially in light of  the fact that he really doesn't seem to be progressing quite as well as I would like is still has necrotic bone noted in the base of the wound. Obviously IV antibiotics will help as well I have referred him to fix disease unfortunately the beginning of month [name], [name] ([id number]) [id number] Page 7 of 11 is appointment got pushed to the end of this month that actually is on Monday. Subsequently we're gonna see were things stand once they see him. Readmission: 03-04-2023 upon evaluation today patient appears to be doing somewhat poorly today in regard to the wound on the right plantar foot. 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 With that being said the patient tells me at this point that he has been treating this just at home using some Betadine on the area but really nothing else has been done up to this point. This is actually his first office for his care is concerned in a clinic setting. Patient's past medical history actually is very similar to previous when I saw him. He does have a history of diabetes mellitus type 2, peripheral vascular disease, chronic kidney disease stage IV, hypertension, he has not had any formal arterial testing up to this point. Patient is likely going require an x-ray today. Patient History Information obtained from Patient. Allergies Sulfamethoxazole-Trimethoprim, amlodipine, lisinopril, Jardiance, terbinafine Family History Diabetes - Maternal Grandparents, Heart Disease - Mother, No family history of Cancer, Hereditary Spherocytosis, Hypertension, Kidney Disease, Lung Disease, Seizures, Stroke, Thyroid Problems, Tuberculosis. Social History Never smoker, Marital Status - Married, Alcohol Use - Moderate, Drug Use - No History, Caffeine Use - Daily. Medical  History Hematologic/Lymphatic Denies history of Anemia, Hemophilia, Human Immunodeficiency Virus, Lymphedema, Sickle Cell Disease Respiratory Denies history of Aspiration, Asthma, Chronic Obstructive Pulmonary Disease (COPD), Pneumothorax, Sleep Apnea, Tuberculosis Gastrointestinal Denies history of Cirrhosis , Colitis, Crohns, Hepatitis A, Hepatitis B, Hepatitis C Endocrine Patient has history of Type II Diabetes Denies history of Type I Diabetes Genitourinary Denies history of End Stage Renal Disease Immunological Denies history of Lupus Erythematosus, Raynauds, Scleroderma Integumentary (Skin) Denies history of History of Burn, History of pressure wounds Musculoskeletal Denies history of Gout, Rheumatoid Arthritis, Osteoarthritis, Osteomyelitis Neurologic Denies history of Dementia, Neuropathy, Quadriplegia, Paraplegia, Seizure Disorder Oncologic Denies history of Received Chemotherapy, Received Radiation Psychiatric Denies history of Anorexia/bulimia, Confinement Anxiety Patient is treated with Insulin. Blood sugar is not tested. Blood sugar results noted at the following times: Breakfast - 50. Medical A Surgical History Notes nd Genitourinary CKD stage 4 Review of Systems (ROS) Constitutional Symptoms (General Health) Denies complaints or symptoms of Fatigue, Fever, Chills, Marked Weight Change. Eyes Denies complaints or symptoms of Dry Eyes, Vision Changes, Glasses / Contacts. Ear/Nose/Mouth/Throat Denies complaints or symptoms of Difficult clearing ears, Sinusitis. Objective Constitutional sitting or standing blood pressure is within target range for patient.. pulse regular and within target range for patient.[name] respirations regular, non-labored and within target range for patient.[name] temperature within target range for patient.. Well-nourished and well-hydrated in no acute distress. Vitals Time Taken: 2:10 PM, Height: 68 in, Source: Stated, Weight: 209 lbs, Source:  Stated, BMI: 31.8, Temperature: 97.7 F, Pulse: 52 bpm, Respiratory Rate: 18 breaths/min, Blood Pressure: 144/72 mmHg. Eyes conjunctiva clear no eyelid edema noted. pupils equal round and reactive to light and accommodation. Ears, Nose, Mouth, and Throat no gross abnormality of ear auricles or external auditory canals. normal hearing noted during conversation. mucus membranes moist. Respiratory [name], [name] ([id number]) [id number] Page 8 of 11 normal breathing without difficulty. Cardiovascular 1+ dorsalis pedis/posterior tibialis pulses. no clubbing, cyanosis, significant edema, Musculoskeletal normal gait and posture. no significant deformity or arthritic changes, no loss or range of motion, no clubbing. Psychiatric this patient is able to make decisions and demonstrates good insight into disease process. Alert and Oriented x 3. pleasant and cooperative. General Notes: Upon inspection patient's wound bed actually showed signs of need for sharp debridement I actually did perform debridement to clean away some of the callus to expose the actual wound. Also clean the surface of wound to clear away the necrotic debris. Again all this was done without any complication the patient  visually probably has good arterial flow but again we cannot confirm that everything looks good on testing prior to making assumptions here. He is in agreement with this plan. The good news is I do not see any signs of active infection at this time. I do believe an offloading shoe would be beneficial. Integumentary (Hair, Skin) Wound #2 status is Open. Original cause of wound was Pressure Injury. The date acquired was: 01/02/2023. The wound is located on the Right,Plantar Foot. The wound measures 0.2cm length x 0.2cm width x 0.2cm depth; 0.031cm^2 area and 0.006cm^3 volume. There is a medium amount of serosanguineous drainage noted. There is no granulation within the wound bed. There is no  necrotic tissue within the wound bed. Assessment Active Problems ICD-10 Type 2 diabetes mellitus with foot ulcer Non-pressure chronic ulcer of other part of right foot with fat layer exposed Other specified peripheral vascular diseases Chronic kidney disease, stage 4 (severe) Essential (primary) hypertension Procedures Wound #2 Pre-procedure diagnosis of Wound #2 is a Diabetic Wound/Ulcer of the Lower Extremity located on the Right,Plantar Foot .Severity of Tissue Pre Debridement is: Fat layer exposed. There was a Excisional Skin/Subcutaneous Tissue Debridement with a total area of 0.03 sq cm performed by Allen Derry, PA-C. With the following instrument(s): Curette to remove Viable and Non-Viable tissue/material. Material removed includes Callus, Subcutaneous Tissue, and Slough after achieving pain control using Lidocaine 4% T opical Solution. No specimens were taken. A time out was conducted at 15:10, prior to the start of the procedure. A Minimum amount of bleeding was controlled with Pressure. The procedure was tolerated well with a pain level of 0 throughout and a pain level of 0 following the procedure. Post Debridement Measurements: 0.2cm length x 0.2cm width x 0.2cm depth; 0.006cm^3 volume. Character of Wound/Ulcer Post Debridement is stable. Severity of Tissue Post Debridement is: Fat layer exposed. Post procedure Diagnosis Wound #2: Same as Pre-Procedure Plan Follow-up Appointments: Return Appointment in 1 week. Bathing/ Shower/ Hygiene: May shower; gently cleanse wound with antibacterial soap, rinse and pat dry prior to dressing wounds Anesthetic (Use 'Patient Medications' Section for Anesthetic Order Entry): Lidocaine applied to wound bed Off-Loading: Open toe surgical shoe with peg assist. Radiology ordered were: X-ray, foot - Right foot Services and Therapies ordered were: Ankle Brachial Index (ABI) WOUND #2: - Foot Wound Laterality: Plantar, Right Cleanser: Soap and  Water 3 x Per Week/30 Days Discharge Instructions: Gently cleanse wound with antibacterial soap, rinse and pat dry prior to dressing wounds Prim Dressing: Hydrofera Blue Ready Transfer Foam, 2.5x2.5 (in/in) (DME) (Dispense As Written) 3 x Per Week/30 Days ary Discharge Instructions: Apply Hydrofera Blue Ready to wound bed as directed Secondary Dressing: (BORDER) Zetuvit Plus SILICONE BORDER Dressing 4x4 (in/in) (DME) (Generic) 3 x Per Week/30 Days Discharge Instructions: Please do not put silicone bordered dressings under wraps. Use non-bordered dressing only. 1. I will go ahead and send the patient for an x-ray of his foot. 2. I am going to send him for screening with [facility] for an arterial study with ABI TBI as well. 3. I am going to go ahead and initiate treatment with a Hydrofera Blue dressing followed by bordered foam dressing. 4. I am also can recommend that we go ahead and place the patient in a postop shoe with peg assist for offloading purposes. We will see patient back for reevaluation in 1 week here in the clinic. If anything worsens or changes patient will contact our office for additional [name], [name] (  [id number]) [id number] Page 9 of 11 recommendations. Electronic Signature(s) Signed: 03/04/2023 6:06:59 PM By: Allen Derry PA-C Previous Signature: 03/04/2023 6:06:11 PM Version By: Allen Derry PA-C Entered By: Allen Derry on 03/04/2023 15:06:59 -------------------------------------------------------------------------------- ROS/PFSH Details Patient Name: Date of Service: [name], JO HN M. 03/04/2023 2:00 PM Medical Record Number: [id number] Patient Account Number: [id number] Date of Birth/Sex: Treating RN: February 19, 1961 (62 y.o. [name] Primary Care Provider: [name] Other Clinician: Referring Provider: Treating Provider/Extender: Carita Pian, JEREMY Weeks in Treatment: 0 Information Obtained  From Patient Constitutional Symptoms (General Health) Complaints and Symptoms: Negative for: Fatigue; Fever; Chills; Marked Weight Change Eyes Complaints and Symptoms: Negative for: Dry Eyes; Vision Changes; Glasses / Contacts Ear/Nose/Mouth/Throat Complaints and Symptoms: Negative for: Difficult clearing ears; Sinusitis Hematologic/Lymphatic Medical History: Negative for: Anemia; Hemophilia; Human Immunodeficiency Virus; Lymphedema; Sickle Cell Disease Respiratory Medical History: Negative for: Aspiration; Asthma; Chronic Obstructive Pulmonary Disease (COPD); Pneumothorax; Sleep Apnea; Tuberculosis Gastrointestinal Medical History: Negative for: Cirrhosis ; Colitis; Crohns; Hepatitis A; Hepatitis B; Hepatitis C Endocrine Medical History: Positive for: Type II Diabetes Negative for: Type I Diabetes Treated with: Insulin Blood sugar tested every day: No Blood sugar testing results: Breakfast: 50 Genitourinary Medical History: [name], [name] ([id number]) [id number] Page 10 of 11 Negative for: End Stage Renal Disease Past Medical History Notes: CKD stage 4 Immunological Medical History: Negative for: Lupus Erythematosus; Raynauds; Scleroderma Integumentary (Skin) Medical History: Negative for: History of Burn; History of pressure wounds Musculoskeletal Medical History: Negative for: Gout; Rheumatoid Arthritis; Osteoarthritis; Osteomyelitis Neurologic Medical History: Negative for: Dementia; Neuropathy; Quadriplegia; Paraplegia; Seizure Disorder Oncologic Medical History: Negative for: Received Chemotherapy; Received Radiation Psychiatric Medical History: Negative for: Anorexia/bulimia; Confinement Anxiety Immunizations Pneumococcal Vaccine: Received Pneumococcal Vaccination: No Implantable Devices None Family and Social History Cancer: No; Diabetes: Yes - Maternal Grandparents; Heart Disease: Yes - Mother; Hereditary  Spherocytosis: No; Hypertension: No; Kidney Disease: No; Lung Disease: No; Seizures: No; Stroke: No; Thyroid Problems: No; Tuberculosis: No; Never smoker; Marital Status - Married; Alcohol Use: Moderate; Drug Use: No History; Caffeine Use: Daily; Financial Concerns: No; Food, Clothing or Shelter Needs: No; Support System Lacking: No; Transportation Concerns: No Electronic Signature(s) Signed: 03/04/2023 6:45:54 PM By: Allen Derry PA-C Signed: 03/07/2023 1:24:46 PM By: Midge Aver MSN RN CNS WTA Entered By: Midge Aver on 03/04/2023 11:16:05 -------------------------------------------------------------------------------- SuperBill Details Patient Name: Date of Service: [name] M. 03/04/2023 Medical Record Number: [id number] Patient Account Number: [id number] Date of Birth/Sex: Treating RN: [birth year] (62 y.o. [name] Primary Care Provider: [name] Other Clinician: Referring Provider: Treating Provider/Extender: Marijo Sanes Weeks in TreatmentRHOAN, [name] ([id number]) [id number] Page 11 of 11 Diagnosis Coding ICD-10 Codes Code Description E11.621 Type 2 diabetes mellitus with foot ulcer L97.512 Non-pressure chronic ulcer of other part of right foot with fat layer exposed I73.89 Other specified peripheral vascular diseases N18.4 Chronic kidney disease, stage 4 (severe) I10 Essential (primary) hypertension Facility Procedures : CPT4 Code: 66063016 Description: 99213 - WOUND CARE VISIT-LEV 3 EST PT Modifier: Quantity: 1 : CPT4 Code: 01093235 Description: 11042 - DEB SUBQ TISSUE 20 SQ CM/< ICD-10 Diagnosis Description L97.512 Non-pressure chronic ulcer of other part of right foot with fat layer exposed Modifier: Quantity: 1 Physician Procedures : CPT4 Code Description Modifier [id number] 99204 - WC PHYS LEVEL 4 - NEW PT 25 ICD-10 Diagnosis Description E11.621 Type 2 diabetes mellitus with foot ulcer  L97.512 Non-pressure chronic ulcer of other part of right foot with fat layer exposed I73.89  Other  specified peripheral vascular diseases N18.4 Chronic kidney disease, stage 4 (severe) Quantity: 1 : [id number] 11042 - WC PHYS SUBQ TISS 20 SQ CM ICD-10 Diagnosis Description L97.512 Non-pressure chronic ulcer of other part of right foot with fat layer exposed Quantity: 1 Electronic Signature(s) Signed: 03/04/2023 6:07:30 PM By: Allen Derry PA-C Entered By: Allen Derry on 03/04/2023 15:07:29

## 2023-03-07 NOTE — Progress Notes (Signed)
[name] ([id number]) [id number] Page 1 of 10 Visit Report for 03/04/2023 Allergy List Details Patient Name: Date of Service: [name] [facility] M. 03/04/2023 2:00 PM Medical Record Number: [id number] Patient Account Number: [id number] Date of Birth/Sex: Treating RN: [birth year] (62 y.o. [name] Primary Care Alcie Runions: [name] Other Clinician: Referring Channing Yeager: Treating Nadav Swindell/Extender: Carita Pian, JEREMY Weeks in Treatment: 0 Allergies Active Allergies Sulfamethoxazole-Trimethoprim amlodipine lisinopril Jardiance terbinafine Allergy Notes Electronic Signature(s) Signed: 03/07/2023 1:24:46 PM By: Midge Aver MSN RN CNS WTA Entered By: Midge Aver on 03/04/2023 11:13:14 -------------------------------------------------------------------------------- Arrival Information Details Patient Name: Date of Service: [name] HN M. 03/04/2023 2:00 PM Medical Record Number: [id number] Patient Account Number: [id number] Date of Birth/Sex: Treating RN: 1961/05/05 (62 y.o. [name] Primary Care Laityn Bensen: [name] Other Clinician: Referring Ladon Vandenberghe: Treating Leomia Blake/Extender: Marijo Sanes Weeks in Treatment: 0 Visit Information Patient Arrived: Ambulatory Arrival Time: 14:02 Accompanied By: self Transfer Assistance: None Patient Identification Verified: Yes Secondary Verification Process Completed: Yes JAVIS, JAGGARD ([id number]) Patient Requires Transmission-Based Precautions: No Patient Has Alerts: Yes Patient Alerts: Diabetic Typ History Since Last Visit Added or deleted any medications: No Any new allergies or adverse reactions: No Has Dressing in Place as Prescribed: Yes Pain Present Now: No [id number] Page 2 of 10 e 2 Electronic Signature(s) Signed: 03/07/2023 1:24:46 PM By: Midge Aver MSN RN CNS WTA Entered By: Midge Aver on 03/04/2023  11:10:13 -------------------------------------------------------------------------------- Clinic Level of Care Assessment Details Patient Name: Date of Service: [name] M. 03/04/2023 2:00 PM Medical Record Number: [id number] Patient Account Number: [id number] Date of Birth/Sex: Treating RN: January 01, 1961 (62 y.o. [name] Primary Care Sehaj Mcenroe: [name] Other Clinician: Referring Brittni Hult: Treating Masiyah Jorstad/Extender: Carita Pian, JEREMY Weeks in Treatment: 0 Clinic Level of Care Assessment Items TOOL 1 Quantity Score X- 1 0 Use when EandM and Procedure is performed on INITIAL visit ASSESSMENTS - Nursing Assessment / Reassessment X- 1 20 General Physical Exam (combine w/ comprehensive assessment (listed just below) when performed on new pt. evals) X- 1 25 Comprehensive Assessment (HX, ROS, Risk Assessments, Wounds Hx, etc.) ASSESSMENTS - Wound and Skin Assessment / Reassessment []  - 0 Dermatologic / Skin Assessment (not related to wound area) ASSESSMENTS - Ostomy and/or Continence Assessment and Care []  - 0 Incontinence Assessment and Management []  - 0 Ostomy Care Assessment and Management (repouching, etc.) PROCESS - Coordination of Care X - Simple Patient / Family Education for ongoing care 1 15 []  - 0 Complex (extensive) Patient / Family Education for ongoing care X- 1 10 Staff obtains Chiropractor, Records, T Results / Process Orders est []  - 0 Staff telephones HHA, Nursing Homes / Clarify orders / etc []  - 0 Routine Transfer to another Facility (non-emergent condition) []  - 0 Routine Hospital Admission (non-emergent condition) X- 1 15 New Admissions / Manufacturing engineer / Ordering NPWT Apligraf, etc. , []  - 0 Emergency Hospital Admission (emergent condition) PROCESS - Special Needs []  - 0 Pediatric / Minor Patient Management []  - 0 Isolation Patient Management []  - 0 Hearing / Language / Visual special needs []  -  0 Assessment of Community assistance (transportation, D/C planning, etc.) []  - 0 Additional assistance / Altered mentation []  - 0 Support Surface(s) Assessment (bed, cushion, seat, etc.) [name], [name] ([id number]) [id number] Page 3 of 10 INTERVENTIONS - Miscellaneous []  - 0 External ear exam []  - 0 Patient Transfer (multiple staff / Nurse, adult / Similar devices) []  -  0 Simple Staple / Suture removal (25 or less) []  - 0 Complex Staple / Suture removal (26 or more) []  - 0 Hypo/Hyperglycemic Management (do not check if billed separately) X- 1 15 Ankle / Brachial Index (ABI) - do not check if billed separately Has the patient been seen at the hospital within the last three years: Yes Total Score: 100 Level Of Care: New/Established - Level 3 Electronic Signature(s) Signed: 03/07/2023 1:24:46 PM By: Midge Aver MSN RN CNS WTA Entered By: Midge Aver on 03/04/2023 12:18:50 -------------------------------------------------------------------------------- Encounter Discharge Information Details Patient Name: Date of Service: [name] HN M. 03/04/2023 2:00 PM Medical Record Number: [id number] Patient Account Number: [id number] Date of Birth/Sex: Treating RN: 1960/12/05 (62 y.o. [name] Primary Care Jalina Blowers: [name] Other Clinician: Referring Rhondalyn Clingan: Treating Bryann Mcnealy/Extender: Carita Pian, JEREMY Weeks in Treatment: 0 Encounter Discharge Information Items Post Procedure Vitals Discharge Condition: Stable Temperature (F): 97.7 Ambulatory Status: Ambulatory Pulse (bpm): 52 Discharge Destination: Home Respiratory Rate (breaths/min): 18 Transportation: Private Auto Blood Pressure (mmHg): 144/72 Accompanied By: self Schedule Follow-up Appointment: Yes Clinical Summary of Care: Electronic Signature(s) Signed: 03/04/2023 4:45:55 PM By: Midge Aver MSN RN CNS WTA Entered By: Midge Aver on 03/04/2023  13:45:54 -------------------------------------------------------------------------------- Lower Extremity Assessment Details Patient Name: Date of Service: [name] HN M. 03/04/2023 2:00 PM Medical Record Number: [id number] Patient Account Number: [id number] Date of Birth/Sex: Treating RN: Mar 31, 1961 (62 y.o. [name] Primary Care Allegra Cerniglia: [name] Other Clinician: [name] ([id number]) [id number] Page 4 of 10 Referring Micah Barnier: Treating Ansar Skoda/Extender: Carita Pian, JEREMY Weeks in Treatment: 0 Edema Assessment Assessed: [Left: No] [Right: No] [Left: Edema] [Right: :] Calf Left: Right: Point of Measurement: 36 cm From Medial Instep 36 cm 35.2 cm Ankle Left: Right: Point of Measurement: 13 cm From Medial Instep 23.2 cm 24 cm Knee To Floor Left: Right: From Medial Instep 40 cm 40 cm Vascular Assessment Pulses: Dorsalis Pedis Palpable: [Left:Yes] [Right:Yes] Extremity colors, hair growth, and conditions: Extremity Color: [Left:Normal] [Right:Normal] Hair Growth on Extremity: [Left:Yes] [Right:Yes] Temperature of Extremity: [Left:Warm] [Right:Warm] Capillary Refill: [Left:< 3 seconds] [Right:< 3 seconds] Dependent Rubor: [Left:No] [Right:No] Blanched when Elevated: [Left:No] [Right:No] Lipodermatosclerosis: [Left:No] [Right:No] Blood Pressure: Brachial: [Left:144] [Right:144] Ankle: [Left:Dorsalis Pedis: 120 0.83] [Right:Dorsalis Pedis: 220 1.53] Toe Nail Assessment Left: Right: Thick: No No Discolored: No No Deformed: No No Improper Length and Hygiene: No No Electronic Signature(s) Signed: 03/07/2023 1:24:46 PM By: Midge Aver MSN RN CNS WTA Entered By: Midge Aver on 03/04/2023 11:38:26 -------------------------------------------------------------------------------- Multi Wound Chart Details Patient Name: Date of Service: [name] HN M. 03/04/2023 2:00 PM Medical Record Number:  [id number] Patient Account Number: [id number] Date of Birth/Sex: Treating RN: 1961/04/21 (62 y.o. [name] Primary Care Diedra Sinor: [name] Other Clinician: Referring Neilah Fulwider: Treating Damaree Sargent/Extender: Marijo Sanes Weeks in Treatment: 0 Vital Signs HO, HERGENREDER ([id number]) [id number] Page 5 of 10 Height(in): 68 Pulse(bpm): 52 Weight(lbs): 209 Blood Pressure(mmHg): 144/72 Body Mass Index(BMI): 31.8 Temperature(F): 97.7 Respiratory Rate(breaths/min): 18 [2:Photos:] [N/A:N/A] Right, Plantar Foot N/A N/A Wound Location: Pressure Injury N/A N/A Wounding Event: Diabetic Wound/Ulcer of the Lower N/A N/A Primary Etiology: Extremity Type II Diabetes N/A N/A Comorbid History: 01/02/2023 N/A N/A Date Acquired: 0 N/A N/A Weeks of Treatment: Open N/A N/A Wound Status: No N/A N/A Wound Recurrence: 0.2x0.2x0.2 N/A N/A Measurements L x W x D (cm) 0.031 N/A N/A A (cm) : rea 0.006 N/A N/A Volume (cm) : Grade 1 N/A  N/A Classification: Medium N/A N/A Exudate A mount: Serosanguineous N/A N/A Exudate Type: red, brown N/A N/A Exudate Color: None Present (0%) N/A N/A Granulation A mount: None Present (0%) N/A N/A Necrotic A mount: Fascia: No N/A N/A Exposed Structures: Fat Layer (Subcutaneous Tissue): No Tendon: No Muscle: No Joint: No Bone: No None N/A N/A Epithelialization: Debridement - Selective/Open Wound N/A N/A Debridement: Pre-procedure Verification/Time Out 15:10 N/A N/A Taken: Lidocaine 4% Topical Solution N/A N/A Pain Control: Callus N/A N/A Tissue Debrided: Non-Viable Tissue N/A N/A Level: 0.03 N/A N/A Debridement A (sq cm): rea Curette N/A N/A Instrument: Minimum N/A N/A Bleeding: Pressure N/A N/A Hemostasis A chieved: 0 N/A N/A Procedural Pain: 0 N/A N/A Post Procedural Pain: Procedure was tolerated well N/A N/A Debridement Treatment Response: 0.2x0.2x0.2 N/A N/A Post  Debridement Measurements L x W x D (cm) 0.006 N/A N/A Post Debridement Volume: (cm) Debridement N/A N/A Procedures Performed: Treatment Notes Wound #2 (Foot) Wound Laterality: Plantar, Right Cleanser Soap and Water Discharge Instruction: Gently cleanse wound with antibacterial soap, rinse and pat dry prior to dressing wounds Peri-Wound Care Topical Primary Dressing Hydrofera Blue Ready Transfer Foam, 2.5x2.5 (in/in) Discharge Instruction: Apply Hydrofera Blue Ready to wound bed as directed Secondary Dressing (BORDER) Zetuvit Plus SILICONE BORDER Dressing 4x4 (in/in) Discharge Instruction: Please do not put silicone bordered dressings under wraps. Use non-bordered dressing only. KRIS, TEASD[place] ([id number]) [id number] Page 6 of 10 Secured With Compression Wrap Compression Stockings Facilities manager) Signed: 03/04/2023 4:45:38 PM By: Midge Aver MSN RN CNS WTA Previous Signature: 03/04/2023 2:51:43 PM Version By: Allen Derry PA-C Entered By: Midge Aver on 03/04/2023 13:45:38 -------------------------------------------------------------------------------- Multi-Disciplinary Care Plan Details Patient Name: Date of Service: [name] HN M. 03/04/2023 2:00 PM Medical Record Number: [id number] Patient Account Number: [id number] Date of Birth/Sex: Treating RN: 03-Apr-1961 (62 y.o. [name] Primary Care Alassane Kalafut: [name] Other Clinician: Referring Evianna Chandran: Treating Fariha Goto/Extender: Carita Pian, JEREMY Weeks in Treatment: 0 Active Inactive Necrotic Tissue Nursing Diagnoses: Impaired tissue integrity related to necrotic/devitalized tissue Knowledge deficit related to management of necrotic/devitalized tissue Goals: Necrotic/devitalized tissue will be minimized in the wound bed Date Initiated: 03/04/2023 Target Resolution Date: 04/10/2023 Goal Status: Active Patient/caregiver will verbalize understanding  of reason and process for debridement of necrotic tissue Date Initiated: 03/04/2023 Target Resolution Date: 04/10/2023 Goal Status: Active Interventions: Assess patient pain level pre-, during and post procedure and prior to discharge Provide education on necrotic tissue and debridement process Treatment Activities: Excisional debridement : 03/04/2023 Notes: Pressure Nursing Diagnoses: Knowledge deficit related to causes and risk factors for pressure ulcer development Knowledge deficit related to management of pressures ulcers Potential for impaired tissue integrity related to pressure, friction, moisture, and shear Goals: Patient will remain free from development of additional pressure ulcers Date Initiated: 03/04/2023 Target Resolution Date: 04/04/2023 Goal Status: Active Patient will remain free of pressure ulcers Date Initiated: 03/04/2023 Target Resolution Date: 04/04/2023 VALDIS, OBENSHAIN ([id number]) [phone].pdf Page 7 of 10 Goal Status: Active Patient/caregiver will verbalize risk factors for pressure ulcer development Date Initiated: 03/04/2023 Target Resolution Date: 04/04/2023 Goal Status: Active Patient/caregiver will verbalize understanding of pressure ulcer management Date Initiated: 03/04/2023 Target Resolution Date: 04/04/2023 Goal Status: Active Interventions: Assess offloading mechanisms upon admission and as needed Provide education on pressure ulcers Notes: Wound/Skin Impairment Nursing Diagnoses: Impaired tissue integrity Knowledge deficit related to ulceration/compromised skin integrity Goals: Patient/caregiver will verbalize understanding of skin care regimen Date Initiated: 03/04/2023 Target Resolution Date: 04/04/2023 Goal Status: Active Ulcer/skin breakdown  will have a volume reduction of 30% by week 4 Date Initiated: 03/04/2023 Target Resolution Date: 04/04/2023 Goal Status: Active Ulcer/skin breakdown will have a volume  reduction of 50% by week 8 Date Initiated: 03/04/2023 Target Resolution Date: 05/04/2023 Goal Status: Active Ulcer/skin breakdown will have a volume reduction of 80% by week 12 Date Initiated: 03/04/2023 Target Resolution Date: 06/04/2023 Goal Status: Active Ulcer/skin breakdown will heal within 14 weeks Date Initiated: 03/04/2023 Target Resolution Date: 06/18/2023 Goal Status: Active Interventions: Assess patient/caregiver ability to obtain necessary supplies Assess patient/caregiver ability to perform ulcer/skin care regimen upon admission and as needed Assess ulceration(s) every visit Provide education on ulcer and skin care Treatment Activities: Referred to DME Kimberley Dastrup for dressing supplies : 03/04/2023 Skin care regimen initiated : 03/04/2023 Notes: Electronic Signature(s) Signed: 03/04/2023 4:43:04 PM By: Midge Aver MSN RN CNS WTA Previous Signature: 03/04/2023 4:42:56 PM Version By: Midge Aver MSN RN CNS WTA Entered By: Midge Aver on 03/04/2023 13:43:03 -------------------------------------------------------------------------------- Pain Assessment Details Patient Name: Date of Service: [name] HN M. 03/04/2023 2:00 PM Medical Record Number: [id number] Patient Account Number: [id number] Date of Birth/Sex: Treating RN: [birth year] (62 y.o. [name] Primary Care Larayne Baxley: [name] Other Clinician: Referring Jaymason Ledesma: Treating Kaliel Bolds/Extender: Marijo Sanes Weeks in TreatmentKUPER, SIVERS ([id number]) [id number] Page 8 of 10 Active Problems Location of Pain Severity and Description of Pain Patient Has Paino No Site Locations Pain Management and Medication Current Pain Management: Electronic Signature(s) Signed: 03/07/2023 1:24:46 PM By: Midge Aver MSN RN CNS WTA Entered By: Midge Aver on 03/04/2023  11:10:26 -------------------------------------------------------------------------------- Patient/Caregiver Education Details Patient Name: Date of Service: [name] 8/20/2024andnbsp2:00 PM Medical Record Number: [id number] Patient Account Number: [id number] Date of Birth/Gender: Treating RN: 06/25/1961 (62 y.o. [name] Primary Care Physician: [name] Other Clinician: Referring Physician: Treating Physician/Extender: Marijo Sanes Weeks in Treatment: 0 Education Assessment Education Provided To: Patient Education Topics Provided Wound Debridement: Handouts: Wound Debridement Methods: Explain/Verbal Responses: State content correctly Wound/Skin Impairment: Handouts: Caring for Your Ulcer Methods: Explain/Verbal Responses: State content correctly Electronic Signature(s) CHAU, SABER ([id number]) [id number] Page 9 of 10 Signed: 03/07/2023 1:24:46 PM By: Midge Aver MSN RN CNS WTA Entered By: Midge Aver on 03/04/2023 13:45:10 -------------------------------------------------------------------------------- Wound Assessment Details Patient Name: Date of Service: [name] HN M. 03/04/2023 2:00 PM Medical Record Number: [id number] Patient Account Number: [id number] Date of Birth/Sex: Treating RN: 04-01-1961 (62 y.o. [name] Primary Care Sean Malinowski: [name] Other Clinician: Referring Dishon Kehoe: Treating Maegan Buller/Extender: Carita Pian, JEREMY Weeks in Treatment: 0 Wound Status Wound Number: 2 Primary Etiology: Diabetic Wound/Ulcer of the Lower Extremity Wound Location: Right, Plantar Foot Wound Status: Open Wounding Event: Pressure Injury Comorbid History: Type II Diabetes Date Acquired: 01/02/2023 Weeks Of Treatment: 0 Clustered Wound: No Photos Wound Measurements Length: (cm) 0.2 Width: (cm) 0.2 Depth: (cm) 0.2 Area: (cm) 0.031 Volume: (cm) 0.006 % Reduction in  Area: % Reduction in Volume: Epithelialization: None Wound Description Classification: Grade 1 Exudate Amount: Medium Exudate Type: Serosanguineous Exudate Color: red, brown Foul Odor After Cleansing: No Slough/Fibrino No Wound Bed Granulation Amount: None Present (0%) Exposed Structure Necrotic Amount: None Present (0%) Fascia Exposed: No Fat Layer (Subcutaneous Tissue) Exposed: No Tendon Exposed: No Muscle Exposed: No Joint Exposed: No Bone Exposed: No Treatment Notes Wound #2 (Foot) Wound Laterality: Plantar, Right ABDULRAHMAN, PINHEIRO ([id number]) [id number] Page 10 of 10 Cleanser Soap and Water Discharge Instruction: Gently cleanse wound  with antibacterial soap, rinse and pat dry prior to dressing wounds Peri-Wound Care Topical Primary Dressing Hydrofera Blue Ready Transfer Foam, 2.5x2.5 (in/in) Discharge Instruction: Apply Hydrofera Blue Ready to wound bed as directed Secondary Dressing (BORDER) Zetuvit Plus SILICONE BORDER Dressing 4x4 (in/in) Discharge Instruction: Please do not put silicone bordered dressings under wraps. Use non-bordered dressing only. Secured With Compression Wrap Compression Stockings Facilities manager) Signed: 03/07/2023 1:24:46 PM By: Midge Aver MSN RN CNS WTA Entered By: Midge Aver on 03/04/2023 11:29:00 -------------------------------------------------------------------------------- Vitals Details Patient Name: Date of Service: [place] Breeding, JO HN M. 03/04/2023 2:00 PM Medical Record Number: [id number] Patient Account Number: [id number] Date of Birth/Sex: Treating RN: May 20, 1961 (62 y.o. [name] Primary Care Valena Ivanov: [name] Other Clinician: Referring Vasily Fedewa: Treating Ericia Moxley/Extender: Carita Pian, JEREMY Weeks in Treatment: 0 Vital Signs Time Taken: 14:10 Temperature (F): 97.7 Height (in): 68 Pulse (bpm): 52 Source: Stated Respiratory Rate (breaths/min):  18 Weight (lbs): 209 Blood Pressure (mmHg): 144/72 Source: Stated Reference Range: 80 - 120 mg / dl Body Mass Index (BMI): 31.8 Electronic Signature(s) Signed: 03/07/2023 1:24:46 PM By: Midge Aver MSN RN CNS WTA Entered By: Midge Aver on 03/04/2023 11:11:19

## 2023-03-07 NOTE — Progress Notes (Signed)
[name] ([id number]) [id number] Page 1 of 5 Visit Report for 03/04/2023 Abuse Risk Screen Details Patient Name: Date of Service: [name] [facility] M. 03/04/2023 2:00 PM Medical Record Number: [id number] Patient Account Number: [id number] Date of Birth/Sex: Treating RN: 1961-02-15 (62 y.o. [name] Primary Care Decorian Schuenemann: [name] Other Clinician: Referring Bilaal Leib: Treating Jossue Rubenstein/Extender: Carita Pian, JEREMY Weeks in Treatment: 0 Abuse Risk Screen Items Answer Electronic Signature(s) Signed: 03/07/2023 1:24:46 PM By: Midge Aver MSN RN CNS WTA Entered By: Midge Aver on 03/04/2023 11:16:13 -------------------------------------------------------------------------------- Activities of Daily Living Details Patient Name: Date of Service: [name] M. 03/04/2023 2:00 PM Medical Record Number: [id number] Patient Account Number: [id number] Date of Birth/Sex: Treating RN: Aug 30, 1960 (62 y.o. [name] Primary Care Eathan Groman: [name] Other Clinician: Referring Samul Mcinroy: Treating Kariya Lavergne/Extender: Carita Pian, JEREMY Weeks in Treatment: 0 Activities of Daily Living Items Answer Activities of Daily Living (Please select one for each item) Drive Automobile Completely Able T Medications ake Completely Able Use T elephone Completely Able Care for Appearance Completely Able Use T oilet Completely Able Bath / Shower Completely Able Dress Self Completely Able Feed Self Completely Able Walk Completely Able Get In / Out Bed Completely Able Housework Completely Able Prepare Meals Completely Able Handle Money Completely Able Shop for Self Completely ALTIN, BRISCO ([id number]) [phone] [id number] Page 2 of 5 Electronic Signature(s) Signed: 03/07/2023 1:24:46 PM By: Midge Aver MSN RN CNS WTA Entered By: Midge Aver on 03/04/2023  11:16:25 -------------------------------------------------------------------------------- Education Screening Details Patient Name: Date of Service: [name] [facility] M. 03/04/2023 2:00 PM Medical Record Number: [id number] Patient Account Number: [id number] Date of Birth/Sex: Treating RN: July 07, 1961 (62 y.o. [name] Primary Care Nidya Bouyer: [name] Other Clinician: Referring Oluwasemilore Bahl: Treating Jessaca Philippi/Extender: Marijo Sanes Weeks in Treatment: 0 Learning Preferences/Education Level/Primary Language Learning Preference: Explanation, Demonstration Preferred Language: English Cognitive Barrier Language Barrier: No Translator Needed: No Memory Deficit: No Emotional Barrier: No Cultural/Religious Beliefs Affecting Medical Care: No Physical Barrier Impaired Vision: No Impaired Hearing: No Decreased Hand dexterity: No Knowledge/Comprehension Knowledge Level: High Comprehension Level: High Ability to understand written instructions: High Ability to understand verbal instructions: High Motivation Anxiety Level: Calm Cooperation: Cooperative Education Importance: Acknowledges Need Interest in Health Problems: Asks Questions Perception: Coherent Willingness to Engage in Self-Management High Activities: Readiness to Engage in Self-Management High Activities: Electronic Signature(s) Signed: 03/07/2023 1:24:46 PM By: Midge Aver MSN RN CNS WTA Entered By: Midge Aver on 03/04/2023 11:16:51 [name] ([id number]) [phone] [id number] Page 3 of 5 -------------------------------------------------------------------------------- Fall Risk Assessment Details Patient Name: Date of Service: [name] [facility] M. 03/04/2023 2:00 PM Medical Record Number: [id number] Patient Account Number: [id number] Date of Birth/Sex: Treating RN: 1960/10/29 (62 y.o. [name] Primary Care Haskell Rihn: [name] Other  Clinician: Referring Alexandrea Westergard: Treating Barton Want/Extender: Carita Pian, JEREMY Weeks in Treatment: 0 Fall Risk Assessment Items Have you had 2 or more falls in the last 12 monthso 0 No Have you had any fall that resulted in injury in the last 12 monthso 0 No FALLS RISK SCREEN History of falling - immediate or within 3 months 0 No Secondary diagnosis (Do you have 2 or more medical diagnoseso) 0 No Ambulatory aid None/bed rest/wheelchair/nurse 0 No Crutches/cane/walker 0 No Furniture 0 No Intravenous therapy Access/Saline/Heparin Lock 0 No Gait/Transferring Normal/ bed rest/ wheelchair 0 No Weak (short steps with or without shuffle, stooped but able to lift head while  walking, may seek 0 No support from furniture) Impaired (short steps with shuffle, may have difficulty arising from chair, head down, impaired 0 No balance) Mental Status Oriented to own ability 0 Yes Electronic Signature(s) Signed: 03/07/2023 1:24:46 PM By: Midge Aver MSN RN CNS WTA Entered By: Midge Aver on 03/04/2023 11:17:12 -------------------------------------------------------------------------------- Foot Assessment Details Patient Name: Date of Service: [name] HN M. 03/04/2023 2:00 PM Medical Record Number: [id number] Patient Account Number: [id number] Date of Birth/Sex: Treating RN: 10-19-1960 (62 y.o. [name] Primary Care Anani Gu: [name] Other Clinician: Referring Gizella [place]: Treating Victormanuel Mclure/Extender: Marijo Sanes Weeks in Treatment: 0 Foot Assessment Items Site Locations POWELL, GRELLE [place] ([id number]) [id number] Page 4 of 5 + = Sensation present, - = Sensation absent, C = Callus, U = Ulcer R = Redness, W = Warmth, M = Maceration, PU = Pre-ulcerative lesion F = Fissure, S = Swelling, D = Dryness Assessment Right: Left: Other Deformity: No No Prior Foot Ulcer: Yes No Prior Amputation: No No Charcot Joint: No  No Ambulatory Status: Ambulatory Without Help Gait: Steady Electronic Signature(s) Signed: 03/07/2023 1:24:46 PM By: Midge Aver MSN RN CNS WTA Entered By: Midge Aver on 03/04/2023 11:23:24 -------------------------------------------------------------------------------- Nutrition Risk Screening Details Patient Name: Date of Service: [name] HN M. 03/04/2023 2:00 PM Medical Record Number: [id number] Patient Account Number: [id number] Date of Birth/Sex: Treating RN: May 14, 1961 (62 y.o. [name] Primary Care Chance Karam: [name] Other Clinician: Referring Charmeka [place]: Treating Brigett Estell/Extender: Carita Pian, JEREMY Weeks in Treatment: 0 Height (in): 68 Weight (lbs): 209 Body Mass Index (BMI): 31.8 Nutrition Risk Screening Items Score Screening NUTRITION RISK SCREEN: I have an illness or condition that made me change the kind and/or amount of food I eat 0 No I eat fewer than two meals per day 0 No I eat few fruits and vegetables, or milk products 0 No I have three or more drinks of beer, liquor or wine almost every day 0 No I have tooth or mouth problems that make it hard for me to eat 0 No I don't always have enough money to buy the food I need 0 No DUFFIE, HAMPSON ([id number]) [id number] [id number] Page 5 of 5 I eat alone most of the time 0 No I take three or more different prescribed or over-the-counter drugs a day 1 Yes Without wanting to, I have lost or gained 10 pounds in the last six months 0 No I am not always physically able to shop, cook and/or feed myself 0 No Nutrition Protocols Good Risk Protocol 0 No interventions needed Moderate Risk Protocol High Risk Proctocol Risk Level: Good Risk Score: 1 Electronic Signature(s) Signed: 03/07/2023 1:24:46 PM By: Midge Aver MSN RN CNS WTA Entered By: Midge Aver on 03/04/2023 11:17:34

## 2023-03-10 ENCOUNTER — Telehealth: Payer: Self-pay

## 2023-03-10 ENCOUNTER — Other Ambulatory Visit: Payer: Medicaid Other

## 2023-03-10 NOTE — Transitions of Care (Post Inpatient/ED Visit) (Signed)
   03/10/2023  Name: DAYLN YUST MRN: [id number] DOB: 1961-04-21  Today's TOC FU Call Status: Today's TOC FU Call Status:: Unsuccessful Call (3rd Attempt) Unsuccessful Call (3rd Attempt) Date: 03/10/23  Attempted to reach the patient regarding the most recent Inpatient/ED visit.  Follow Up Plan: No further outreach attempts will be made at this time. We have been unable to contact the patient.   Kadie Balestrieri J. Cristela Felt, RN, BSN, MSN Care Management Coordinator/[place] Phone Number: [phone]

## 2023-03-11 ENCOUNTER — Encounter: Payer: Medicaid Other | Admitting: Internal Medicine

## 2023-03-11 DIAGNOSIS — N184 Chronic kidney disease, stage 4 (severe): Secondary | ICD-10-CM | POA: Diagnosis not present

## 2023-03-11 DIAGNOSIS — I129 Hypertensive chronic kidney disease with stage 1 through stage 4 chronic kidney disease, or unspecified chronic kidney disease: Secondary | ICD-10-CM | POA: Diagnosis not present

## 2023-03-11 DIAGNOSIS — E1122 Type 2 diabetes mellitus with diabetic chronic kidney disease: Secondary | ICD-10-CM | POA: Diagnosis not present

## 2023-03-11 DIAGNOSIS — L97512 Non-pressure chronic ulcer of other part of right foot with fat layer exposed: Secondary | ICD-10-CM | POA: Diagnosis not present

## 2023-03-11 DIAGNOSIS — E1151 Type 2 diabetes mellitus with diabetic peripheral angiopathy without gangrene: Secondary | ICD-10-CM | POA: Diagnosis not present

## 2023-03-11 DIAGNOSIS — E11621 Type 2 diabetes mellitus with foot ulcer: Secondary | ICD-10-CM | POA: Diagnosis not present

## 2023-03-13 ENCOUNTER — Ambulatory Visit: Payer: Medicaid Other | Attending: Cardiovascular Disease | Admitting: Cardiovascular Disease

## 2023-03-13 ENCOUNTER — Other Ambulatory Visit: Payer: Self-pay

## 2023-03-13 ENCOUNTER — Encounter: Payer: Self-pay | Admitting: Cardiovascular Disease

## 2023-03-13 VITALS — BP 132/80 | HR 58 | Ht 68.0 in | Wt 202.8 lb

## 2023-03-13 DIAGNOSIS — I5022 Chronic systolic (congestive) heart failure: Secondary | ICD-10-CM

## 2023-03-13 DIAGNOSIS — E785 Hyperlipidemia, unspecified: Secondary | ICD-10-CM | POA: Diagnosis not present

## 2023-03-13 DIAGNOSIS — I2510 Atherosclerotic heart disease of native coronary artery without angina pectoris: Secondary | ICD-10-CM | POA: Diagnosis not present

## 2023-03-13 DIAGNOSIS — I447 Left bundle-branch block, unspecified: Secondary | ICD-10-CM

## 2023-03-13 NOTE — Progress Notes (Signed)
Cardiology Office Note   Date:  03/13/2023   ID:  [name], DOB 27-Mar-1961, MRN [id number]  PCP:  Smitty Cords, DO  Cardiologist:   Lorine Bears, MD   Chief Complaint  Patient presents with   Follow-up    Concerned with intermittent dizziness with med changes by heart failure clinic last week.  Med changes were stop STOP Losartan, START Valsartan 80mg  daily and TAKE Hydralazine twice daily.  Orthostatic bp obtained today.      History of Present Illness: [name] is a 62 y.o. male who presents for a follow-up visit regarding chronic systolic heart failure and coronary artery disease. He has past medical history of CAD, mixed ischemic and nonischemic cardiomyopathy, diabetes, hypertension, hyperlipidemia, and stage III chronic kidney disease.  He is status post drug-eluting stent placement to the LAD in April 2018.  At that time, he was found to have severe LV dysfunction with an EF of 25 to 30% and diffuse hypokinesis which was felt to be out of proportion to the LAD disease.  He also has a history of alcohol abuse.  A follow-up echocardiogram in July 2018 showed improved LV function with an EF of 40 to 45%.   Amlodipine was discontinued due to leg edema.    He had previous toe amputation but no history of peripheral arterial disease.  He has chronic kidney disease with GFR around 25.[name]  He follows with nephrology.    Most recent echocardiogram in February 2022 showed an EF of 45 to 50%.  There was concern about possible cardiac amyloidosis.  PYP scan was equivocal for amyloidosis though.  He was prescribed Farxiga by nephrology but was not covered.  Jardiance had to be stopped due to a rash.  He was hospitalized recently with acute on chronic systolic heart failure.  He improved with IV diuresis.  He was started on small dose losartan before hospital discharge and then was switched to valsartan.  Since hospital discharge, he has been feeling very dizzy  especially in the morning.  He is orthostatic today.  No chest pain.    Past Medical History:  Diagnosis Date   Ankle pain    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 10/2016 Echo: EF 25-30%, diff HK; b. 01/2017 Echo:  EF 40-45%, GrI DD; c. 08/2017 Echo: EF 40-45%, GrI DD; d. 08/2020 Echo: EF 45-50%, glob HK. Mod asymm LVH, GrI DD, Nl RV size/fxn; e. 08/2020 PYP: equivocal for ATTR cardiac amyloid.   CKD (chronic kidney disease), stage III - IV (HCC)    Coronary artery disease    a. 10/2016 Cath/PCI: LAD 58m (3.5x15 Xience Alpine DES). No other obstructive disease.   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 10/2016 Echo: EF 25-30%, diff HK; b. 01/2017 Echo:  EF 40-45%, GrI DD; c. 08/2017 Echo: EF 40-45%, GrI DD; d. 08/2020 Echo: EF 45-50%, glob HK. Mod asymm LVH, GrI DD, Nl RV size/fxn.   Pain in both feet     Past Surgical History:  Procedure Laterality Date   CARDIAC CATHETERIZATION     CORONARY STENT INTERVENTION N/A 10/21/2016   Procedure: Coronary Stent Intervention;  Surgeon: Iran Ouch, MD;  Location: [facility] INVASIVE CV LAB;  Service: Cardiovascular;  Laterality: N/A;   LEFT HEART CATH AND CORONARY ANGIOGRAPHY N/A 10/21/2016   Procedure:  Left Heart Cath and Coronary Angiography;  Surgeon: Iran Ouch, MD;  Location: [facility] INVASIVE CV LAB;  Service: Cardiovascular;  Laterality: N/A;   TIBIA FRACTURE SURGERY Right 2002     Current Outpatient Medications  Medication Sig Dispense Refill   ACCU-CHEK FASTCLIX LANCETS MISC Check sugar up to 3 x daily as instructed 102 each 12   ACCU-CHEK GUIDE test strip Check blood sugar up to 3 times daily as advised 100 each 12   acetaminophen (TYLENOL 8 HOUR) 650 MG CR tablet Take 1 tablet (650 mg total) by mouth every 8 (eight) hours as needed for pain.     aspirin EC 81 MG tablet Take 81 mg by mouth daily.      atorvastatin (LIPITOR) 40 MG tablet TAKE 1 TABLET BY MOUTH EVERY DAY 90 tablet 1   carvedilol (COREG) 6.25 MG tablet Take 1 tablet (6.25 mg total) by mouth 2 (two) times daily with a meal. 60 tablet 1   doxazosin (CARDURA) 1 MG tablet TAKE 2 TABLETS (2 MG TOTAL) BY MOUTH AT BEDTIME. 180 tablet 1   furosemide (LASIX) 40 MG tablet Take 1 tablet (40 mg total) by mouth daily. 30 tablet 1   GVOKE HYPOPEN 2-PACK 1 MG/0.2ML SOAJ Inject 1 mg into the skin as needed (hypoglycemia). 1 mL 2   hydrALAZINE (APRESOLINE) 100 MG tablet Take 1 tablet (100 mg total) by mouth 2 (two) times daily.     Insulin Pen Needle (B-D ULTRAFINE III SHORT PEN) 31G X 8 MM MISC USE TO INJECT INSULIN NIGHTLY 100 each 6   isosorbide mononitrate (IMDUR) 60 MG 24 hr tablet TAKE 1 TABLET BY MOUTH 2 TIMES DAILY. 180 tablet 1   Lancets Misc. MISC Use  Brand compatable to insurance and monitor to check blood sugar up to 3 times daily. ICD10 E11.9 100 each 12   LANTUS SOLOSTAR 100 UNIT/ML Solostar Pen INJECT 40 UNITS INTO THE SKIN AT BEDTIME. 45 mL 1   valsartan (DIOVAN) 80 MG tablet Take 1 tablet (80 mg total) by mouth daily. 30 tablet 3   gabapentin (NEURONTIN) 400 MG capsule Take 1 capsule (400 mg total) by mouth 2 (two) times daily. (Patient not taking: Reported on 03/13/2023) 360 capsule 1   No current facility-administered medications for this visit.    Allergies:   Sulfamethoxazole-trimethoprim, Amlodipine, Lisinopril, Jardiance [empagliflozin], and Terbinafine and related    Social History:  The patient  reports that he has never smoked. He has never used smokeless tobacco. He reports current alcohol use. He reports that he does not use drugs.   Family History:  The patient's family history includes Heart disease in his maternal grandfather; Hypertension in his mother.    ROS:  Please see the history of present illness.   Otherwise, review of systems are positive for none.   All other systems are reviewed and negative.     PHYSICAL EXAM: VS:  BP 132/80 (BP Location: Left Arm, Patient Position: Sitting, Cuff Size: Normal)   Pulse (!) 58   Ht 5\' 8"  (1.727 m)   Wt 202 lb 12.8 oz (92 kg)   SpO2 97%   BMI 30.84 kg/m  , BMI Body mass index is 30.84 kg/m. GEN: Well nourished, well developed, in no acute distress  HEENT: normal  Neck: No  carotid bruits, or masses.  Mild JVD Cardiac: RRR; no murmurs, rubs, or gallops, mild bilateral leg edema Respiratory:  clear to auscultation bilaterally, normal work of breathing GI: soft, nontender, nondistended, +  BS MS: no deformity or atrophy  Skin: warm and dry, no rash Neuro:  Strength and sensation are intact Psych: euthymic mood, full affect   EKG:  EKG is  ordered today. EKG showed sinus bradycardia with left bundle branch block.   Recent Labs: 01/30/2023: TSH 1.57 02/19/2023: ALT 21; B Natriuretic Peptide 1,502.1 02/21/2023: Magnesium 2.4 02/22/2023: Hemoglobin 9.8; Platelets 166 02/23/2023: BUN 80; Creatinine, Ser 3.73; Potassium 4.4; Sodium 136    Lipid Panel    Component Value Date/Time   CHOL 132 01/30/2023 0857   CHOL 173 03/16/2021 1503   TRIG 143 01/30/2023 0857   HDL 38 (L) 01/30/2023 0857   HDL 36 (L) 03/16/2021 1503   CHOLHDL 3.5 01/30/2023 0857   VLDL 34 10/20/2016 0515   LDLCALC 71 01/30/2023 0857   LDLDIRECT 74 03/16/2021 1503      Wt Readings from Last 3 Encounters:  03/13/23 202 lb 12.8 oz (92 kg)  03/05/23 211 lb (95.7 kg)  02/28/23 209 lb (94.8 kg)          10/28/2016    3:28 PM  PAD Screen  Previous PAD dx? No  Previous surgical procedure? No  Pain with walking? No  Feet/toe relief with dangling? No  Painful, non-healing ulcers? No  Extremities discolored? No      ASSESSMENT AND PLAN:  1.  Coronary artery disease: Status post LAD stenting in April 2018.  He has done well since then without any recurrent chest pain or dyspnea.      Continue aspirin indefinitely.  2.  Chronic systolic heart failure.  Recent  echocardiogram showed an EF of 50 to 55%.  Treatment of heart failure is limited by advanced chronic kidney disease with creatinine around 4.  He seems to be tolerating a small dose of valsartan but not sure for how long.  He is dizzy and lightheaded likely due to orthostatic dizziness and underlying bradycardia.  Thus, I am going to wean him off small dose of clonidine.  Continue other medications for now.   He appears to be euvolemic on furosemide 40 mg once daily.   3.  Essential hypertension: Blood pressure is controlled but he is orthostatic with dizziness.  In addition, he is more bradycardic.  I am weaning him off clonidine.   4.  Hyperlipidemia: Continue treatment with atorvastatin.  Most recent lipid profile showed an LDL of 74 which is close to target.   5.  Type 2 diabetes mellitus: Most recent hemoglobin A1c was 8.9.    6.  Stage 4 chronic kidney disease: Continue close follow-up with Dr. Cherylann Ratel.  I suspect that he will require renal replacement therapy within the next 12 months.   Disposition:    FU with me in 6 months  Signed,  Lorine Bears, MD  03/13/2023 5:07 PM    [facility]

## 2023-03-13 NOTE — Patient Instructions (Addendum)
Medication Instructions:  Your physician recommends the following medication changes.  Decrease to STOP TAKING: Clonidine 0.1 mg ONCE daily for 2 days then stop (last dose on 31 August)  *If you need a refill on your cardiac medications before your next appointment, please call your pharmacy*   Lab Work: None If you have labs (blood work) drawn today and your tests are completely normal, you will receive your results only by: MyChart Message (if you have MyChart) OR A paper copy in the mail If you have any lab test that is abnormal or we need to change your treatment, we will call you to review the results.   Testing/Procedures: None   Follow-Up: At [facility], you and your health needs are our priority.  As part of our continuing mission to provide you with exceptional heart care, we have created designated Provider Care Teams.  These Care Teams include your primary Cardiologist (physician) and Advanced Practice Providers (APPs -  Physician Assistants and Nurse Practitioners) who all work together to provide you with the care you need, when you need it.  We recommend signing up for the patient portal called "MyChart".  Sign up information is provided on this After Visit Summary.  MyChart is used to connect with patients for Virtual Visits (Telemedicine).  Patients are able to view lab/test results, encounter notes, upcoming appointments, etc.  Non-urgent messages can be sent to your provider as well.   To learn more about what you can do with MyChart, go to [email or web address].    Your next appointment:   6 month(s)  Provider:   You may see Lorine Bears, MD or one of the following Advanced Practice Providers on your designated Care Team:

## 2023-03-14 NOTE — Progress Notes (Signed)
NORI, KUNSELMAN ([id number]) [id number] Page 1 of 8 Visit Report for 03/11/2023 Debridement Details Patient Name: Date of Service: [name] [facility] M. 03/11/2023 9:00 A M Medical Record Number: [id number] Patient Account Number: [id number] Date of Birth/Sex: Treating RN: 10-25-1960 (62 y.o. [name]) Yevonne Pax Primary Care Provider: [name] Other Clinician: Betha Loa Referring Provider: Treating Provider/Extender: RO BSO N, MICHA EL Pershing Proud, Alexander Tania Ade in Treatment: 1 Debridement Performed for Assessment: Wound #2 Right,Plantar Foot Performed By: Physician Maxwell Caul, MD Debridement Type: Debridement Severity of Tissue Pre Debridement: Fat layer exposed Level of Consciousness (Pre-procedure): Awake and Alert Pre-procedure Verification/Time Out Yes - 09:52 Taken: Start Time: 09:52 Percent of Wound Bed Debrided: 100% T Area Debrided (cm): otal 0.12 Tissue and other material debrided: Viable, Non-Viable, Callus, Slough, Skin: Dermis , Skin: Epidermis, Slough Level: Skin/Epidermis Debridement Description: Selective/Open Wound Instrument: Curette Bleeding: Minimum Hemostasis Achieved: Pressure Response to Treatment: Procedure was tolerated well Level of Consciousness (Post- Awake and Alert procedure): Post Debridement Measurements of Total Wound Length: (cm) 0.5 Width: (cm) 0.3 Depth: (cm) 0.2 Volume: (cm) 0.024 Character of Wound/Ulcer Post Debridement: Stable Severity of Tissue Post Debridement: Fat layer exposed Post Procedure Diagnosis Same as Pre-procedure Electronic Signature(s) Signed: 03/11/2023 10:16:54 AM By: Baltazar Najjar MD Signed: 03/14/2023 11:58:03 AM By: Yevonne Pax RN Entered By: Baltazar Najjar on 03/11/2023 07:16:54 HPI Details -------------------------------------------------------------------------------- [name] ([id number]) [id number] Page 2 of  8 Patient Name: Date of Service: Harold Mould M. 03/11/2023 9:00 A M Medical Record Number: [id number] Patient Account Number: [id number] Date of Birth/Sex: Treating RN: November 02, 1960 (62 y.o. [name]) Yevonne Pax Primary Care Provider: [name] Other Clinician: Betha Loa Referring Provider: Treating Provider/Extender: RO BSO N, MICHA EL Pershing Proud, Alexander Tania Ade in Treatment: 1 History of Present Illness Chronic/Inactive Conditions Condition 1: 03-04-2023 patient's arterial screening today actually appears to show some signs of being somewhat low at 0.83 on the left 1.53 on the right which is essentially noncompressible. I really do not think we can assure ourselves of these readings and I think he really should see vascular for a thorough arterial evaluation in order to ensure that he has good blood flow although I suspect he probably does. HPI Description: 07/20/17 on evaluation today patient actually appears to be doing somewhat poorly in regard to his right plantar great toe ulcer. This is something that he states started as what he thought to be a blister May 04, 2018. Subsequently he treated this on his own for a while thinking that it would just get better. As it did not get better he eventually did go to have this evaluated where it was treated appropriately with it sounds like Bactroban ointment however the patient states that it never completely resolved. Subsequently if this continue to get worse and was looking more irritated and inflamed he did go to the hospital more recently where he was prescribed Keflex. Subsequently he was also referred to the wound care center here for further evaluation by [place]. He had an x-ray in the ER which revealed no evidence of osteomyelitis that I explained to the patient that osteomyelitis can be present even in a negative x- ray as it's not nearly as sensitive as an MRI would be. No fevers, chills, nausea, or vomiting noted at  this time. The patient tells me that this might look a little bit better since he started the antibiotics. He does have a history of diabetes mellitus type II he also had a  horse accident years ago and had to have significant surgery in regard to his right lower extremity that did leave some deformity in this regard. This is mainly centered in the shin region. 07/27/18 upon evaluation today patient appears to be doing fairly well in regard to his toe ulcer all things considered. 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 This obviously is good news. With that being said I do think that he did have some infection which obviously calls some of the necrosis although the wound seems to be doing better he is definitely tolerating the Cipro which I switched into based on the bone culture and seems to be dramatically improving in this regard. Overall I'm pleased with how things stand. 08/03/18 on evaluation today patient appears to be doing rather well in regard to his toe ulcer all things considering. Again at this point the patient's wound shows signs of improvement although this is slow it still seems to be doing very well in my pinion. Fortunately there does not seem to show any signs of infection as far as the overall appearance of the wound bed is concerned at least nothing worsening. Overall the redness and erythema has dramatically improved. I'm very pleased. Patient does have his infectious disease consult on February 3 and his MRI is actually scheduled for the 23rd of this month which is just three days away. 08/10/18 on evaluation today patient actually appears to be doing okay in regard to his toe ulcer. It does not appear to be as inflamed as it was previous. 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 He still has bone exposed in the base of the wound. I am concerned about how well this is actually going to heal or not at this point. Overall I do believe that the Cipro has been of benefit for him and I will continue this one more week until he sees infectious disease next Monday. I'm also gonna see about however getting appointment for him to see podiatry to discuss his options. 08/18/18 on evaluation today patient's wound actually appears to be doing decently well. He does have bone noted at this point in the base of the wound still although there does not appear to be as much necrotic bone as there was previous. He has been tolerating the Cipro without any complications. He did subsequently have an appointment with infectious disease which was supposed to be yesterday. However they called him when he was getting the car to leave to go in order to tell him that they would not be able to see him due to the doctor being out and rescheduled him for the 24th which is three weeks away. T be o honest I'm not very pleased with that scenario. Nonetheless the patient tells me as well today that he is not even sure that he would want to go see a podiatrist at this point he really wants to do whatever he can to avoid surgery. 08/25/18 on evaluation today patient's wound continues to show signs of necrotic bone in the base of the wound as well is necrotic tissue in general. He  does still have your theme of the great toe he's currently on the oral antibiotics while we are still awaiting the referral to infectious disease which is scheduled for the 24th of this month. At this point I did have a longer conversation today with him concerning hyperbaric option therapy and how considering he doesn't want to have any surgery wants to try to manage  this medically it could potentially help in getting this to resolve. Fortunately there does not appear to be any signs of infection at this time systemically. No fevers, chills, nausea, or vomiting noted at this time. He does have a history of having had some heart disease with what sounds to be potentially stand I would have to get an updated EKG prior to HBO therapy. Nonetheless he is not a smoker and has no lung conditions. I'm not sure if you set in echo but if not we may have to see about this as well prior to initiating therapy to ensure his ejection fraction is sufficient. I will look further into this as well. 09/01/18 on evaluation today patient's toe actually appears to be doing about the same may be slightly better in regard to the overall quality of the wound bed although he still has bone exposed some of this will still requiring debridement today due to be necrotic on the tip. Fortunately there's no signs of worsening infection. We did get approval for hyperbaric oxygen therapy for the patient although he states that he thought about it and really does not want to do that. He would like to see infectious disease first at least and he has an appointment with them on Monday. Subsequently following that appointment I'll see him on Tuesday and then we'll have a longer discussion about what we're gonna do. His toe again also showed a pathologic fracture and I think this is also a big part of the issue as well. Again as I explained to the patient today and have previous I think that the hyperbaric oxygen therapy would be of great benefit for him as far as trying to clear out the infection especially in light of the fact that he really doesn't seem to be progressing quite as well as I would like is still has necrotic bone noted in the base of the wound. Obviously IV antibiotics will help as well I have referred him to fix disease unfortunately the beginning of month is appointment got pushed to  the end of this month that actually is on Monday. Subsequently we're gonna see were things stand once they see him. Readmission: 03-04-2023 upon evaluation today patient appears to be doing somewhat poorly today in regard to the wound on the right plantar foot. 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 With that being said the patient tells me at this point that he has been treating this just at home using some Betadine on the area but really nothing else has been done up to this point. This is actually his first office for his care is concerned in a clinic setting. Patient's past medical history actually is very similar to previous when I saw him. He does have a history of diabetes mellitus type 2, peripheral vascular disease, chronic kidney disease stage IV, hypertension, he has not had any formal arterial testing up to this point. Patient is likely going require  an x-ray today. 8/27; patient was seen for the first time last week he has a wound over the metatarsal head on the right foot. 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 Allena Katz of podiatry. 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 He has been using Hydrofera Blue and sit to vet to the wound area. He had an x-ray done that showed faint sclerosis of the head of the second metatarsal which was nonspecific but new from 07/16/2018 which could be posttraumatic secondary to avascular necrosis or chronic infection Electronic Signature(s) GAELEN, RAYAS ([id number]) [id number] Page 3 of 8 Signed: 03/11/2023 5:06:52 PM By: Baltazar Najjar MD Entered By: Baltazar Najjar on 03/11/2023 07:12:17 -------------------------------------------------------------------------------- Physical Exam Details Patient  Name: Date of Service: Harold Mould M. 03/11/2023 9:00 A M Medical Record Number: [id number] Patient Account Number: [id number] Date of Birth/Sex: Treating RN: 1961-05-27 (62 y.o. [name] Primary Care Provider: [name] Other Clinician: Betha Loa Referring Provider: Treating Provider/Extender: RO BSO N, MICHA EL Pershing Proud, Alexander Tania Ade in Treatment: 1 Constitutional Sitting or standing Blood Pressure is within target range for patient.. Pulse regular and within target range for patient.[name] Respirations regular, non-labored and within target range.. Temperature is normal and within the target range for the patient.[name] appears in no distress. Notes Wound exam; the wound is actually over the second metatarsal head which seems to be subluxed. Thick skin and callus around the wound and fibrinous debris on the surface I remove this with a #3 curette postdebridement the wound looks better. No evidence of surrounding infection. No palpable bone Electronic Signature(s) Signed: 03/11/2023 5:06:52 PM By: Baltazar Najjar MD Entered By: Baltazar Najjar on 03/11/2023 07:13:06 -------------------------------------------------------------------------------- Physician Orders Details Patient Name: Date of Service: [name] [facility] M. 03/11/2023 9:00 A M Medical Record Number: [id number] Patient Account Number: [id number] Date of Birth/Sex: Treating RN: 1961-05-29 (62 y.o. [name] Primary Care Provider: [name] Other Clinician: Betha Loa Referring Provider: Treating Provider/Extender: RO BSO Dorris Carnes, MICHA EL Pershing Proud, Alexander Tania Ade in Treatment: 1 Verbal / Phone Orders: Yes Clinician: Yevonne Pax Read Back and Verified: Yes Diagnosis Coding Follow-up Appointments Return Appointment in 1 week. Bathing/ Shower/ Hygiene May shower; gently cleanse wound with antibacterial soap, rinse and pat dry prior to dressing wounds Anesthetic (Use  'Patient Medications' Section for Anesthetic Order Entry) Lidocaine applied to wound bed Off-Loading Open toe surgical shoe with peg assist. TILLIE, VELDKAMP ([id number]) [id number] Page 4 of 8 Wound Treatment Wound #2 - Foot Wound Laterality: Plantar, Right Cleanser: Soap and Water 3 x Per Week/30 Days Discharge Instructions: Gently cleanse wound with antibacterial soap, rinse and pat dry prior to dressing wounds Prim Dressing: Hydrofera Blue Ready Transfer Foam, 2.5x2.5 (in/in) (Dispense As Written) 3 x Per Week/30 Days ary Discharge Instructions: Apply Hydrofera Blue Ready to wound bed as directed Secondary Dressing: (BORDER) Zetuvit Plus SILICONE BORDER Dressing 4x4 (in/in) (Generic) 3 x Per Week/30 Days Discharge Instructions: Please do not put silicone bordered dressings under wraps. Use non-bordered dressing only. Electronic Signature(s) Signed: 03/11/2023 5:06:52 PM By: Baltazar Najjar MD Signed: 03/12/2023 4:41:54 PM By: Betha Loa Entered By: Betha Loa on 03/11/2023 06:54:46 -------------------------------------------------------------------------------- Problem List Details Patient Name: Date of Service: [name] HN M. 03/11/2023 9:00 A M Medical Record Number: [id number] Patient Account Number: [id number] Date of Birth/Sex: Treating RN: 1960-07-27 (62 y.o. [name] Primary Care Provider: [name] Other  Clinician: Betha Loa Referring Provider: Treating Provider/Extender: Chauncey Mann, MICHA EL Pershing Proud, Alexander Tania Ade in Treatment: 1 Active Problems ICD-10 Encounter Code Description Active Date MDM Diagnosis E11.621 Type 2 diabetes mellitus with foot ulcer 03/04/2023 No Yes L97.512 Non-pressure chronic ulcer of other part of right foot with fat layer exposed 03/04/2023 No Yes I73.89 Other specified peripheral vascular diseases 03/04/2023 No Yes N18.4 Chronic kidney disease, stage 4 (severe) 03/04/2023 No  Yes I10 Essential (primary) hypertension 03/04/2023 No Yes Inactive Problems Resolved Problems Electronic Signature(s) Signed: 03/11/2023 5:06:52 PM By: Baltazar Najjar MD [name] ([id number]) [id number] Page 5 of 8 Entered By: Baltazar Najjar on 03/11/2023 07:10:16 -------------------------------------------------------------------------------- Progress Note Details Patient Name: Date of Service: [name] [facility] M. 03/11/2023 9:00 A M Medical Record Number: [id number] Patient Account Number: [id number] Date of Birth/Sex: Treating RN: [birth year] (62 y.o. [name]) Yevonne Pax Primary Care Provider: [name] Other Clinician: Betha Loa Referring Provider: Treating Provider/Extender: RO BSO N, MICHA EL Pershing Proud, Alexander Tania Ade in Treatment: 1 Subjective History of Present Illness (HPI) Chronic/Inactive Condition: 03-04-2023 patient's arterial screening today actually appears to show some signs of being somewhat low at 0.83 on the left 1.53 on the right which is essentially noncompressible. I really do not think we can assure ourselves of these readings and I think he really should see vascular for a thorough arterial evaluation in order to ensure that he has good blood flow although I suspect he probably does. 07/20/17 on evaluation today patient actually appears to be doing somewhat poorly in regard to his right plantar great toe ulcer. This is something that he states started as what he thought to be a blister May 04, 2018. Subsequently he treated this on his own for a while thinking that it would just get better. As it did not get better he eventually did go to have this evaluated where it was treated appropriately with it sounds like Bactroban ointment however the patient states that it never completely resolved. Subsequently if this continue to get worse and was looking more irritated and inflamed he did go to the hospital  more recently where he was prescribed Keflex. Subsequently he was also referred to the wound care center here for further evaluation by [place]. He had an x-ray in the ER which revealed no evidence of osteomyelitis that I explained to the patient that osteomyelitis can be present even in a negative x-ray as it's not nearly as sensitive as an MRI would be. No fevers, chills, nausea, or vomiting noted at this time. The patient tells me that this might look a little bit better since he started the antibiotics. He does have a history of diabetes mellitus type II he also had a horse accident years ago and had to have significant surgery in regard to his right lower extremity that did leave some deformity in this regard. This is mainly centered in the shin region. 07/27/18 upon evaluation today patient appears to be doing fairly well in regard to his toe ulcer all things considered. 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 This obviously is good news. With that being said I do think that he did have some infection which obviously calls some of the necrosis although the wound seems to be doing better he is definitely tolerating the Cipro which I switched into based on the bone  culture and seems to be dramatically improving in this regard. Overall I'm pleased with how things stand. 08/03/18 on evaluation today patient appears to be doing rather well in regard to his toe ulcer all things considering. Again at this point the patient's wound shows signs of improvement although this is slow it still seems to be doing very well in my pinion. Fortunately there does not seem to show any signs of infection as far as the overall appearance of the wound bed is concerned at least nothing worsening. Overall the redness and erythema has dramatically improved. I'm very pleased. Patient does  have his infectious disease consult on February 3 and his MRI is actually scheduled for the 23rd of this month which is just three days away. 08/10/18 on evaluation today patient actually appears to be doing okay in regard to his toe ulcer. It does not appear to be as inflamed as it was previous. 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 He still has bone exposed in the base of the wound. I am concerned about how well this is actually going to heal or not at this point. Overall I do believe that the Cipro has been of benefit for him and I will continue this one more week until he sees infectious disease next Monday. I'm also gonna see about however getting appointment for him to see podiatry to discuss his options. 08/18/18 on evaluation today patient's wound actually appears to be doing decently well. He does have bone noted at this point in the base of the wound still although there does not appear to be as much necrotic bone as there was previous. He has been tolerating the Cipro without any complications. He did subsequently have an appointment with infectious disease which was supposed to be yesterday. However they called him when he was getting the car to leave to go in order to tell him that they would not be able to see him due to the doctor being out and rescheduled him for the 24th which is three weeks away. T be o honest I'm not very pleased with that scenario. Nonetheless the patient tells me as well today that he is not even sure that he would want to go see a podiatrist at this point he really wants to do whatever he can to avoid surgery. 08/25/18 on evaluation today patient's wound continues to show signs of necrotic bone in the base of the wound as well is  necrotic tissue in general. He does still have your theme of the great toe he's currently on the oral antibiotics while we are still awaiting the referral to infectious disease which is scheduled for the 24th of this month. At this point I did have a longer conversation today with him concerning hyperbaric option therapy and how considering he doesn't want to have any surgery wants to try to manage this medically it could potentially help in getting this to resolve. Fortunately there does not appear to be any signs of infection at this time systemically. No fevers, chills, nausea, or vomiting noted at this time. He does have a history of having had some heart disease with what sounds to be potentially stand I would have to get an updated EKG prior to HBO therapy.  Nonetheless he is not a smoker and has no lung conditions. I'm not sure if you set in echo but if not we may have to see about this as well prior to initiating therapy to ensure his ejection fraction is sufficient. I will look further into this as well. 09/01/18 on evaluation today patient's toe actually appears to be doing about the same may be slightly better in regard to the overall quality of the wound bed although he still has bone exposed some of this will still requiring debridement today due to be necrotic on the tip. Fortunately there's no signs of worsening infection. We did get approval for hyperbaric oxygen therapy for the patient although he states that he thought about it and really does not want to do that. He would like to see infectious disease first at least and he has an appointment with them on Monday. Subsequently following that appointment I'll see him on Tuesday and then we'll have a longer discussion about what we're gonna do. His toe again also showed a pathologic fracture and I think this is also a big part of the issue as well. Again as I explained to the patient today and have previous I think that the hyperbaric  oxygen therapy would be of great benefit for him as far as trying to clear out the infection especially in light of the fact that he really doesn't seem to be progressing quite as well as I would like is still has necrotic bone noted in the base of the wound. Obviously IV antibiotics will help as well I have referred him to fix disease unfortunately the beginning of month is appointment got pushed to the end of this month that actually is on Monday. Subsequently we're gonna see were things stand once they see him. [name], [name] ([id number]) [id number] Page 6 of 8 Readmission: 03-04-2023 upon evaluation today patient appears to be doing somewhat poorly today in regard to the wound on the right plantar foot. 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 With that being said the patient tells me at this point that he has been treating this just at home using some Betadine on the area but really nothing else has been done up to this point. This is actually his first office for his care is concerned in a clinic setting. Patient's past medical history actually is very similar to previous when I saw him. He does have a history of diabetes mellitus type 2, peripheral vascular disease, chronic kidney disease stage IV, hypertension, he has not had any formal arterial testing up to this point. Patient is likely going require an x-ray today. 8/27; patient was seen for the first time last week he has a wound over the metatarsal head on the right foot. 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 Allena Katz of podiatry. 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 He has been using Hydrofera Blue and sit to vet to the wound area. He had an x-ray done that showed faint sclerosis of  the head of the second metatarsal which was nonspecific but new from 07/16/2018 which could be posttraumatic secondary to avascular necrosis or chronic infection Objective Constitutional Sitting or standing Blood Pressure is within target range for patient.. Pulse regular and within  target range for patient.[name] Respirations regular, non-labored and within target range.. Temperature is normal and within the target range for the patient.[name] appears in no distress. Vitals Time Taken: 9:11 AM, Height: 68 in, Weight: 209 lbs, BMI: 31.8, Temperature: 97.5 F, Pulse: 53 bpm, Respiratory Rate: 18 breaths/min, Blood Pressure: 136/73 mmHg. General Notes: Wound exam; the wound is actually over the second metatarsal head which seems to be subluxed. Thick skin and callus around the wound and fibrinous debris on the surface I remove this with a #3 curette postdebridement the wound looks better. No evidence of surrounding infection. No palpable bone Integumentary (Hair, Skin) Wound #2 status is Open. Original cause of wound was Pressure Injury. The date acquired was: 01/02/2023. The wound has been in treatment 1 weeks. The wound is located on the Right,Plantar Foot. The wound measures 0.5cm length x 0.3cm width x 0.2cm depth; 0.118cm^2 area and 0.024cm^3 volume. There is a medium amount of serosanguineous drainage noted. There is no granulation within the wound bed. There is no necrotic tissue within the wound bed. Assessment Active Problems ICD-10 Type 2 diabetes mellitus with foot ulcer Non-pressure chronic ulcer of other part of right foot with fat layer exposed Other specified peripheral vascular diseases Chronic kidney disease, stage 4 (severe) Essential (primary) hypertension Procedures Wound #2 Pre-procedure diagnosis of Wound #2 is a Diabetic Wound/Ulcer of the Lower Extremity located on the Right,Plantar Foot .Severity of Tissue Pre Debridement is: Fat layer exposed. There was a Selective/Open Wound  Skin/Epidermis Debridement with a total area of 0.12 sq cm performed by Maxwell Caul, MD. With the following instrument(s): Curette to remove Viable and Non-Viable tissue/material. Material removed includes Callus, Slough, Skin: Dermis, and Skin: Epidermis. A time out was conducted at 09:52, prior to the start of the procedure. A Minimum amount of bleeding was controlled with Pressure. The procedure was tolerated well. Post Debridement Measurements: 0.5cm length x 0.3cm width x 0.2cm depth; 0.024cm^3 volume. Character of Wound/Ulcer Post Debridement is stable. Severity of Tissue Post Debridement is: Fat layer exposed. Post procedure Diagnosis Wound #2: Same as Pre-Procedure Plan Follow-up Appointments: Return Appointment in 1 week. Bathing/ Shower/ Hygiene: May shower; gently cleanse wound with antibacterial soap, rinse and pat dry prior to dressing wounds [name], [name] ([id number]) [id number] Page 7 of 8 Anesthetic (Use 'Patient Medications' Section for Anesthetic Order Entry): Lidocaine applied to wound bed Off-Loading: Open toe surgical shoe with peg assist. WOUND #2: - Foot Wound Laterality: Plantar, Right Cleanser: Soap and Water 3 x Per Week/30 Days Discharge Instructions: Gently cleanse wound with antibacterial soap, rinse and pat dry prior to dressing wounds Prim Dressing: Hydrofera Blue Ready Transfer Foam, 2.5x2.5 (in/in) (Dispense As Written) 3 x Per Week/30 Days ary Discharge Instructions: Apply Hydrofera Blue Ready to wound bed as directed Secondary Dressing: (BORDER) Zetuvit Plus SILICONE BORDER Dressing 4x4 (in/in) (Generic) 3 x Per Week/30 Days Discharge Instructions: Please do not put silicone bordered dressings under wraps. Use non-bordered dressing only. 1. Postdebridement the wound actually quite look quite good. There is minimal depth generally healthy looking granulation. 2. He has a Pegasys shoe but I do not know that he is  reliably wearing this. I told them he is going to have to keep the pressure off this wound to have any chance of healing it that is basically 24 hours a day 3. With regards to the x-ray I am not really sure what to make of this. My thought was that as long as this wound is improving  then we could forego more advanced imaging like an MRI however certainly if the wound deteriorates that will be required. I did not think any cultures were needed as well. 4. He might do well in a total contact cast. 5. He has vascular studies ordered for his noncompressible ABIs in our clinic although these have not been done as of yet Electronic Signature(s) Signed: 03/11/2023 5:06:52 PM By: Baltazar Najjar MD Entered By: Baltazar Najjar on 03/11/2023 07:15:57 -------------------------------------------------------------------------------- SuperBill Details Patient Name: Date of Service: Harold Mould M. 03/11/2023 Medical Record Number: [id number] Patient Account Number: [id number] Date of Birth/Sex: Treating RN: 05-26-1961 (62 y.o. [name] Primary Care Provider: [name] Other Clinician: Betha Loa Referring Provider: Treating Provider/Extender: RO BSO N, MICHA EL Pershing Proud, Alexander Tania Ade in Treatment: 1 Diagnosis Coding ICD-10 Codes Code Description E11.621 Type 2 diabetes mellitus with foot ulcer L97.512 Non-pressure chronic ulcer of other part of right foot with fat layer exposed I73.89 Other specified peripheral vascular diseases N18.4 Chronic kidney disease, stage 4 (severe) I10 Essential (primary) hypertension Facility Procedures : CPT4 Code: [id number] Description: 97597 - DEBRIDE WOUND 1ST 20 SQ CM OR < ICD-10 Diagnosis Description L97.512 Non-pressure chronic ulcer of other part of right foot with fat layer exposed Modifier: Quantity: 1 Physician Procedures : CPT4 Code Description Modifier [id number] 97597 - WC PHYS DEBR WO ANESTH 20 SQ CM ICD-10 Diagnosis  Description L97.512 Non-pressure chronic ulcer of other part of right foot with fat layer exposed [name] ([id number])  [id number] Page 8 Quantity: 1 of 8 Electronic Signature(s) Signed: 03/11/2023 5:06:52 PM By: Baltazar Najjar MD Entered By: Baltazar Najjar on 03/11/2023 07:16:21

## 2023-03-14 NOTE — Progress Notes (Signed)
DAKOTA, TATES ([id number]) [id number] Page 1 of 9 Visit Report for 03/11/2023 Arrival Information Details Patient Name: Date of Service: [name] [facility] M. 03/11/2023 9:00 A M Medical Record Number: [id number] Patient Account Number: [id number] Date of Birth/Sex: Treating RN: 1961/01/11 (62 y.o. [name]) Yevonne Pax Primary Care Alto Gandolfo: [name] Other Clinician: Betha Loa Referring Haytham Maher: Treating Natesha Hassey/Extender: RO BSO N, MICHA EL Pershing Proud, Alexander Tania Ade in Treatment: 1 Visit Information History Since Last Visit All ordered tests and consults were completed: No Patient Arrived: Ambulatory Added or deleted any medications: No Arrival Time: 09:05 Any new allergies or adverse reactions: No Transfer Assistance: None Had a fall or experienced change in No Patient Identification Verified: Yes activities of daily living that may affect Secondary Verification Process Completed: Yes risk of falls: Patient Requires Transmission-Based Precautions: No Signs or symptoms of abuse/neglect since No Patient Has Alerts: Yes last visito Patient Alerts: Diabetic Type 2 Hospitalized since last visit: No Implantable device outside of the clinic No excluding cellular tissue based products placed in the center since last visit: Has Dressing in Place as Prescribed: Yes Has Footwear/Offloading in Place as Yes Prescribed: Right: Surgical Shoe with Pressure Relief Insole Pain Present Now: Yes Electronic Signature(s) Signed: 03/12/2023 4:41:54 PM By: Betha Loa Entered By: Betha Loa on 03/11/2023 06:11:12 -------------------------------------------------------------------------------- Clinic Level of Care Assessment Details Patient Name: Date of Service: Harold Mould M. 03/11/2023 9:00 A M Medical Record Number: [id number] Patient Account Number: [id number] Date of Birth/Sex: Treating RN: 09/11/1960 (62 y.o. [name]) Yevonne Pax Primary Care Eldora Napp: [name] Other Clinician: Betha Loa Referring Envy Meno: Treating Sudais Banghart/Extender: RO BSO N, MICHA EL Pershing Proud, [name] in Treatment: 1 Clinic Level of Care Assessment Items TOOL 1 Quantity Score [name], [name] ([id number]) [id number] Page 2 of 9 []  - 0 Use when EandM and Procedure is performed on INITIAL visit ASSESSMENTS - Nursing Assessment / Reassessment []  - 0 General Physical Exam (combine w/ comprehensive assessment (listed just below) when performed on new pt. evals) []  - 0 Comprehensive Assessment (HX, ROS, Risk Assessments, Wounds Hx, etc.) ASSESSMENTS - Wound and Skin Assessment / Reassessment []  - 0 Dermatologic / Skin Assessment (not related to wound area) ASSESSMENTS - Ostomy and/or Continence Assessment and Care []  - 0 Incontinence Assessment and Management []  - 0 Ostomy Care Assessment and Management (repouching, etc.) PROCESS - Coordination of Care []  - 0 Simple Patient / Family Education for ongoing care []  - 0 Complex (extensive) Patient / Family Education for ongoing care []  - 0 Staff obtains Chiropractor, Records, T Results / Process Orders est []  - 0 Staff telephones HHA, Nursing Homes / Clarify orders / etc []  - 0 Routine Transfer to another Facility (non-emergent condition) []  - 0 Routine Hospital Admission (non-emergent condition) []  - 0 New Admissions / Manufacturing engineer / Ordering NPWT Apligraf, etc. , []  - 0 Emergency Hospital Admission (emergent condition) PROCESS - Special Needs []  - 0 Pediatric / Minor Patient Management []  - 0 Isolation Patient Management []  - 0 Hearing / Language / Visual special needs []  - 0 Assessment of Community assistance (transportation, D/C planning, etc.) []  - 0 Additional assistance / Altered mentation []  - 0 Support Surface(s) Assessment (bed, cushion, seat, etc.) INTERVENTIONS - Miscellaneous []  -  0 External ear exam []  - 0 Patient Transfer (multiple staff / Nurse, adult / Similar devices) []  - 0 Simple Staple / Suture removal (25 or less) []  - 0 Complex Staple / Suture removal (26 or  more) []  - 0 Hypo/Hyperglycemic Management (do not check if billed separately) []  - 0 Ankle / Brachial Index (ABI) - do not check if billed separately Has the patient been seen at the hospital within the last three years: Yes Total Score: 0 Level Of Care: ____ Electronic Signature(s) Signed: 03/12/2023 4:41:54 PM By: Betha Loa Entered By: Betha Loa on 03/11/2023 06:54:55 Encounter Discharge Information Details -------------------------------------------------------------------------------- [name] ([id number]) [id number] Page 3 of 9 Patient Name: Date of Service: Harold Mould M. 03/11/2023 9:00 A M Medical Record Number: [id number] Patient Account Number: [id number] Date of Birth/Sex: Treating RN: 1961-02-24 (62 y.o. [name]) Yevonne Pax Primary Care Miracle Mongillo: [name] Other Clinician: Betha Loa Referring Arsenio Schnorr: Treating Raynette Arras/Extender: RO BSO N, MICHA EL Pershing Proud, Alexander Tania Ade in Treatment: 1 Encounter Discharge Information Items Post Procedure Vitals Discharge Condition: Stable Temperature (F): 97.5 Ambulatory Status: Ambulatory Pulse (bpm): 53 Discharge Destination: Home Respiratory Rate (breaths/min): 18 Transportation: Private Auto Blood Pressure (mmHg): 136/73 Accompanied By: wife Schedule Follow-up Appointment: Yes Clinical Summary of Care: Electronic Signature(s) Signed: 03/12/2023 4:41:54 PM By: Betha Loa Entered By: Betha Loa on 03/11/2023 07:05:07 -------------------------------------------------------------------------------- Lower Extremity Assessment Details Patient Name: Date of Service: [name] [facility] M. 03/11/2023 9:00 A M Medical Record Number: [id number] Patient Account  Number: [id number] Date of Birth/Sex: Treating RN: Nov 18, 1960 (62 y.o. [name]) Yevonne Pax Primary Care Larrie Lucia: [name] Other Clinician: Betha Loa Referring Treyshaun Keatts: Treating Loring Liskey/Extender: RO BSO N, MICHA EL Pershing Proud, Alexander Tania Ade in Treatment: 1 Edema Assessment Assessed: [Left: No] [Right: Yes] Edema: [Left: Ye] [Right: s] Calf Left: Right: Point of Measurement: 36 cm From Medial Instep 35 cm Ankle Left: Right: Point of Measurement: 13 cm From Medial Instep 24 cm Vascular Assessment Pulses: Dorsalis Pedis Palpable: [Right:Yes] Toe Nail Assessment Left: Right: Thick: No Discolored: No Deformed: No Improper Length and Hygiene: No Electronic Signature(s) Signed: 03/12/2023 4:41:54 PM By: Betha Loa Signed: 03/14/2023 11:58:03 AM By: Yevonne Pax RN [name] ([id number]) By: Yevonne Pax RN [phone].pdf Page 4 of 9 Signed: 03/14/2023 11:58:03 AM Entered By: Betha Loa on 03/11/2023 06:20:29 -------------------------------------------------------------------------------- Multi Wound Chart Details Patient Name: Date of Service: Harold Mould M. 03/11/2023 9:00 A M Medical Record Number: [id number] Patient Account Number: [id number] Date of Birth/Sex: Treating RN: August 03, 1960 (62 y.o. [name]) Yevonne Pax Primary Care Joaovictor Krone: [name] Other Clinician: Betha Loa Referring Kairee Kozma: Treating Jasia Hiltunen/Extender: RO BSO N, MICHA EL Pershing Proud, Alexander Tania Ade in Treatment: 1 Vital Signs Height(in): 68 Pulse(bpm): 53 Weight(lbs): 209 Blood Pressure(mmHg): 136/73 Body Mass Index(BMI): 31.8 Temperature(F): 97.5 Respiratory Rate(breaths/min): 18 [2:Photos:] [N/A:N/A] Right, Plantar Foot N/A N/A Wound Location: Pressure Injury N/A N/A Wounding Event: Diabetic Wound/Ulcer of the Lower N/A N/A Primary Etiology: Extremity Type II Diabetes N/A N/A Comorbid History: 01/02/2023 N/A  N/A Date Acquired: 1 N/A N/A Weeks of Treatment: Open N/A N/A Wound Status: No N/A N/A Wound Recurrence: 0.5x0.3x0.2 N/A N/A Measurements L x W x D (cm) 0.118 N/A N/A A (cm) : rea 0.024 N/A N/A Volume (cm) : -280.60% N/A N/A % Reduction in A rea: -300.00% N/A N/A % Reduction in Volume: Grade 1 N/A N/A Classification: Medium N/A N/A Exudate A mount: Serosanguineous N/A N/A Exudate Type: red, brown N/A N/A Exudate Color: None Present (0%) N/A N/A Granulation A mount: None Present (0%) N/A N/A Necrotic A mount: Fascia: No N/A N/A Exposed Structures: Fat Layer (Subcutaneous Tissue): No Tendon: No Muscle: No Joint: No Bone: No None N/A N/A Epithelialization: Treatment Notes  Electronic Signature(s) Signed: 03/12/2023 4:41:54 PM By: Betha Loa Entered By: Betha Loa on 03/11/2023 06:20:34 [name] ([id number]) [id number] Page 5 of 9 -------------------------------------------------------------------------------- Multi-Disciplinary Care Plan Details Patient Name: Date of Service: [name] [facility] M. 03/11/2023 9:00 A M Medical Record Number: [id number] Patient Account Number: [id number] Date of Birth/Sex: Treating RN: 10/03/1960 (62 y.o. [name]) Yevonne Pax Primary Care Louay Myrie: [name] Other Clinician: Betha Loa Referring Konstantin Lehnen: Treating Joslin Doell/Extender: RO BSO N, MICHA EL Pershing Proud, Alexander Tania Ade in Treatment: 1 Active Inactive Necrotic Tissue Nursing Diagnoses: Impaired tissue integrity related to necrotic/devitalized tissue Knowledge deficit related to management of necrotic/devitalized tissue Goals: Necrotic/devitalized tissue will be minimized in the wound bed Date Initiated: 03/04/2023 Target Resolution Date: 04/10/2023 Goal Status: Active Patient/caregiver will verbalize understanding of reason and process for debridement of necrotic tissue Date Initiated: 03/04/2023 Target  Resolution Date: 04/10/2023 Goal Status: Active Interventions: Assess patient pain level pre-, during and post procedure and prior to discharge Provide education on necrotic tissue and debridement process Treatment Activities: Excisional debridement : 03/04/2023 Notes: Pressure Nursing Diagnoses: Knowledge deficit related to causes and risk factors for pressure ulcer development Knowledge deficit related to management of pressures ulcers Potential for impaired tissue integrity related to pressure, friction, moisture, and shear Goals: Patient will remain free from development of additional pressure ulcers Date Initiated: 03/04/2023 Target Resolution Date: 04/04/2023 Goal Status: Active Patient will remain free of pressure ulcers Date Initiated: 03/04/2023 Target Resolution Date: 04/04/2023 Goal Status: Active Patient/caregiver will verbalize risk factors for pressure ulcer development Date Initiated: 03/04/2023 Target Resolution Date: 04/04/2023 Goal Status: Active Patient/caregiver will verbalize understanding of pressure ulcer management Date Initiated: 03/04/2023 Target Resolution Date: 04/04/2023 Goal Status: Active Interventions: Assess offloading mechanisms upon admission and as needed Provide education on pressure ulcers Notes: ALMANDO, VITTITOW ([id number]) [id number] Page 6 of 9 Wound/Skin Impairment Nursing Diagnoses: Impaired tissue integrity Knowledge deficit related to ulceration/compromised skin integrity Goals: Patient/caregiver will verbalize understanding of skin care regimen Date Initiated: 03/04/2023 Target Resolution Date: 04/04/2023 Goal Status: Active Ulcer/skin breakdown will have a volume reduction of 30% by week 4 Date Initiated: 03/04/2023 Target Resolution Date: 04/04/2023 Goal Status: Active Ulcer/skin breakdown will have a volume reduction of 50% by week 8 Date Initiated: 03/04/2023 Target Resolution Date: 05/04/2023 Goal  Status: Active Ulcer/skin breakdown will have a volume reduction of 80% by week 12 Date Initiated: 03/04/2023 Target Resolution Date: 06/04/2023 Goal Status: Active Ulcer/skin breakdown will heal within 14 weeks Date Initiated: 03/04/2023 Target Resolution Date: 06/18/2023 Goal Status: Active Interventions: Assess patient/caregiver ability to obtain necessary supplies Assess patient/caregiver ability to perform ulcer/skin care regimen upon admission and as needed Assess ulceration(s) every visit Provide education on ulcer and skin care Treatment Activities: Referred to DME Wille Aubuchon for dressing supplies : 03/04/2023 Skin care regimen initiated : 03/04/2023 Notes: Electronic Signature(s) Signed: 03/12/2023 4:41:54 PM By: Betha Loa Signed: 03/14/2023 11:58:03 AM By: Yevonne Pax RN Entered By: Betha Loa on 03/11/2023 06:55:07 -------------------------------------------------------------------------------- Pain Assessment Details Patient Name: Date of Service: [name] HN M. 03/11/2023 9:00 A M Medical Record Number: [id number] Patient Account Number: [id number] Date of Birth/Sex: Treating RN: 1960/08/28 (62 y.o. [name] Primary Care Jaidah Lomax: [name] Other Clinician: Betha Loa Referring Ellysa Parrack: Treating Astraea Gaughran/Extender: RO BSO N, MICHA EL Pershing Proud, Alexander Tania Ade in Treatment: 1 Active Problems Location of Pain Severity and Description of Pain Patient Has Paino Yes Site Locations Pain Location: [name], [name] ([id number]) [id number] Page 7 of 9 Pain Location: Pain in  Ulcers Duration of the Pain. Constant / Intermittento Intermittent Rate the pain. Current Pain Level: 8 Character of Pain Describe the Pain: Sharp, Shooting, Stabbing Pain Management and Medication Current Pain Management: Medication: Yes Cold Application: No Rest: No Massage: No Activity: No T.E.N.S.: No Heat Application:  No Leg drop or elevation: No Is the Current Pain Management Adequate: Inadequate How does your wound impact your activities of daily livingo Sleep: No Bathing: No Appetite: No Relationship With Others: No Bladder Continence: No Emotions: No Bowel Continence: No Work: No Toileting: No Drive: No Dressing: No Hobbies: No Electronic Signature(s) Signed: 03/12/2023 4:41:54 PM By: Betha Loa Signed: 03/14/2023 11:58:03 AM By: Yevonne Pax RN Entered By: Betha Loa on 03/11/2023 06:13:46 -------------------------------------------------------------------------------- Patient/Caregiver Education Details Patient Name: Date of Service: [name] 8/27/2024andnbsp9:00 A M Medical Record Number: [id number] Patient Account Number: [id number] Date of Birth/Gender: Treating RN: 07-27-1960 (62 y.o. [name] Primary Care Physician: [name] Other Clinician: Betha Loa Referring Physician: Treating Physician/Extender: RO BSO N, MICHA EL Pershing Proud, [name] in Treatment: 1 Education Assessment Education Provided To: Patient Education Topics Provided Wound/Skin Impairment: Handouts: Other: continue wound care as directed [name], [name] ([id number]) [id number] Page 8 of 9 Methods: Explain/Verbal Responses: State content correctly Electronic Signature(s) Signed: 03/12/2023 4:41:54 PM By: Betha Loa Entered By: Betha Loa on 03/11/2023 06:55:23 -------------------------------------------------------------------------------- Wound Assessment Details Patient Name: Date of Service: Harold Mould M. 03/11/2023 9:00 A M Medical Record Number: [id number] Patient Account Number: [id number] Date of Birth/Sex: Treating RN: 08/29/1960 (62 y.o. [name]) Yevonne Pax Primary Care Takasha Vetere: [name] Other Clinician: Betha Loa Referring Maksim Peregoy: Treating Emely Fahy/Extender: RO BSO N, MICHA EL  Pershing Proud, Alexander Tania Ade in Treatment: 1 Wound Status Wound Number: 2 Primary Etiology: Diabetic Wound/Ulcer of the Lower Extremity Wound Location: Right, Plantar Foot Wound Status: Open Wounding Event: Pressure Injury Comorbid History: Type II Diabetes Date Acquired: 01/02/2023 Weeks Of Treatment: 1 Clustered Wound: No Photos Wound Measurements Length: (cm) 0.5 Width: (cm) 0.3 Depth: (cm) 0.2 Area: (cm) 0.118 Volume: (cm) 0.024 % Reduction in Area: -280.6% % Reduction in Volume: -300% Epithelialization: None Wound Description Classification: Grade 1 Exudate Amount: Medium Exudate Type: Serosanguineous Exudate Color: red, brown Foul Odor After Cleansing: No Slough/Fibrino No Wound Bed Granulation Amount: None Present (0%) Exposed Structure Necrotic Amount: None Present (0%) Fascia Exposed: No Fat Layer (Subcutaneous Tissue) Exposed: No Tendon Exposed: No Muscle Exposed: No Joint Exposed: No [name], [name] ([id number]) [id number] Page 9 of 9 Bone Exposed: No Treatment Notes Wound #2 (Foot) Wound Laterality: Plantar, Right Cleanser Soap and Water Discharge Instruction: Gently cleanse wound with antibacterial soap, rinse and pat dry prior to dressing wounds Peri-Wound Care Topical Primary Dressing Hydrofera Blue Ready Transfer Foam, 2.5x2.5 (in/in) Discharge Instruction: Apply Hydrofera Blue Ready to wound bed as directed Secondary Dressing (BORDER) Zetuvit Plus SILICONE BORDER Dressing 4x4 (in/in) Discharge Instruction: Please do not put silicone bordered dressings under wraps. Use non-bordered dressing only. Secured With Compression Wrap Compression Stockings Add-Ons Electronic Signature(s) Signed: 03/12/2023 4:41:54 PM By: Betha Loa Signed: 03/14/2023 11:58:03 AM By: Yevonne Pax RN Entered By: Betha Loa on 03/11/2023 06:18:49 -------------------------------------------------------------------------------- Vitals  Details Patient Name: Date of Service: [name] HN M. 03/11/2023 9:00 A M Medical Record Number: [id number] Patient Account Number: [id number] Date of Birth/Sex: Treating RN: July 09, 1961 (62 y.o. [name]) Yevonne Pax Primary Care Zaryiah Barz: [name] Other Clinician: Betha Loa Referring Tyrone Balash: Treating Iliani Vejar/Extender: RO BSO N, MICHA EL Pershing Proud, Alexander Tania Ade in  Treatment: 1 Vital Signs Time Taken: 09:11 Temperature (F): 97.5 Height (in): 68 Pulse (bpm): 53 Weight (lbs): 209 Respiratory Rate (breaths/min): 18 Body Mass Index (BMI): 31.8 Blood Pressure (mmHg): 136/73 Reference Range: 80 - 120 mg / dl Electronic Signature(s) Signed: 03/12/2023 4:41:54 PM By: Betha Loa Entered By: Betha Loa on 03/11/2023 06:13:42

## 2023-03-20 ENCOUNTER — Encounter: Payer: Medicaid Other | Attending: Physician Assistant | Admitting: Physician Assistant

## 2023-03-20 DIAGNOSIS — L97512 Non-pressure chronic ulcer of other part of right foot with fat layer exposed: Secondary | ICD-10-CM | POA: Diagnosis not present

## 2023-03-20 DIAGNOSIS — E11622 Type 2 diabetes mellitus with other skin ulcer: Secondary | ICD-10-CM | POA: Insufficient documentation

## 2023-03-20 DIAGNOSIS — I129 Hypertensive chronic kidney disease with stage 1 through stage 4 chronic kidney disease, or unspecified chronic kidney disease: Secondary | ICD-10-CM | POA: Diagnosis not present

## 2023-03-20 DIAGNOSIS — E11621 Type 2 diabetes mellitus with foot ulcer: Secondary | ICD-10-CM | POA: Insufficient documentation

## 2023-03-20 DIAGNOSIS — E1122 Type 2 diabetes mellitus with diabetic chronic kidney disease: Secondary | ICD-10-CM | POA: Diagnosis not present

## 2023-03-20 DIAGNOSIS — N184 Chronic kidney disease, stage 4 (severe): Secondary | ICD-10-CM | POA: Insufficient documentation

## 2023-03-20 NOTE — Progress Notes (Addendum)
[name] [place] ([id number]) [id number] Page 1 of 9 Visit Report for 03/20/2023 Arrival Information Details Patient Name: Date of Service: [name] [facility] M. 03/20/2023 10:45 A M Medical Record Number: [id number] Patient Account Number: [id number] Date of Birth/Sex: Treating RN: [birth year] (62 y.o. [name] Primary Care [name]: [name] Other Clinician: Betha [name] Referring [name]: Treating [name]/Extender: [name] in Treatment: 2 Visit Information History Since Last Visit All ordered tests and consults were completed: No Patient Arrived: Ambulatory Added or deleted any medications: No Arrival Time: 10:46 Any new allergies or adverse reactions: No Transfer Assistance: None Had a fall or experienced change in No Patient Identification Verified: Yes activities of daily living that may affect Secondary Verification Process Completed: Yes risk of falls: Patient Requires Transmission-Based Precautions: No Signs or symptoms of abuse/neglect since No Patient Has Alerts: Yes last visito Patient Alerts: Diabetic Type 2 Hospitalized since last visit: No Implantable device outside of the clinic No excluding cellular tissue based products placed in the center since last visit: Has Dressing in Place as Prescribed: Yes Has Footwear/Offloading in Place as No Prescribed: Right: Surgical Shoe with Pressure Relief Insole Pain Present Now: No Electronic Signature(s) Signed: 03/20/2023 5:10:42 PM By: [name] Entered By: [name] on 03/20/2023 07:50:27 -------------------------------------------------------------------------------- Clinic Level of Care Assessment Details Patient Name: Date of Service: Harold Mould M. 03/20/2023 10:45 A M Medical Record Number: [id number] Patient Account Number: [id number] Date of Birth/Sex: Treating RN: 04-11-1961 (62 y.o. [name] Primary  Care [name]: [name] Other Clinician: Betha [name] Referring [name]: Treating [name]/Extender: [name] in Treatment: 2 Clinic Level of Care Assessment Items TOOL 1 Quantity Score BROX, CRIADO Y ([id number]) [id number] Page 2 of 9 []  - 0 Use when EandM and Procedure is performed on INITIAL visit ASSESSMENTS - Nursing Assessment / Reassessment []  - 0 General Physical Exam (combine w/ comprehensive assessment (listed just below) when performed on new pt. evals) []  - 0 Comprehensive Assessment (HX, ROS, Risk Assessments, Wounds Hx, etc.) ASSESSMENTS - Wound and Skin Assessment / Reassessment []  - 0 Dermatologic / Skin Assessment (not related to wound area) ASSESSMENTS - Ostomy and/or Continence Assessment and Care []  - 0 Incontinence Assessment and Management []  - 0 Ostomy Care Assessment and Management (repouching, etc.) PROCESS - Coordination of Care []  - 0 Simple Patient / Family Education for ongoing care []  - 0 Complex (extensive) Patient / Family Education for ongoing care []  - 0 Staff obtains Chiropractor, Records, T Results / Process Orders est []  - 0 Staff telephones HHA, Nursing Homes / Clarify orders / etc []  - 0 Routine Transfer to another Facility (non-emergent condition) []  - 0 Routine Hospital Admission (non-emergent condition) []  - 0 New Admissions / Manufacturing engineer / Ordering NPWT Apligraf, etc. , []  - 0 Emergency Hospital Admission (emergent condition) PROCESS - Special Needs []  - 0 Pediatric / Minor Patient Management []  - 0 Isolation Patient Management []  - 0 Hearing / Language / Visual special needs []  - 0 Assessment of Community assistance (transportation, D/C planning, etc.) []  - 0 Additional assistance / Altered mentation []  - 0 Support Surface(s) Assessment (bed, cushion, seat, etc.) INTERVENTIONS - Miscellaneous []  - 0 External ear exam []  -  0 Patient Transfer (multiple staff / Nurse, adult / Similar devices) []  - 0 Simple Staple / Suture removal (25 or less) []  - 0 Complex Staple / Suture removal (26 or more) []  - 0 Hypo/Hyperglycemic Management (do not  check if billed separately) []  - 0 Ankle / Brachial Index (ABI) - do not check if billed separately Has the patient been seen at the hospital within the last three years: Yes Total Score: 0 Level Of Care: ____ Electronic Signature(s) Signed: 03/20/2023 5:10:42 PM By: [name] Entered By: [name] on 03/20/2023 08:21:15 Encounter Discharge Information Details -------------------------------------------------------------------------------- [name] ([id number]) [id number] Page 3 of 9 Patient Name: Date of Service: Harold Mote. 03/20/2023 10:45 A M Medical Record Number: [id number] Patient Account Number: [id number] Date of Birth/Sex: Treating RN: [birth year] (62 y.o. [name] Primary Care [name]: [name] Other Clinician: Betha [name] Referring [name]: Treating [name]/Extender: [name] in Treatment: 2 Encounter Discharge Information Items Post Procedure Vitals Discharge Condition: Stable Temperature (F): 97.8 Ambulatory Status: Ambulatory Pulse (bpm): 56 Discharge Destination: Home Respiratory Rate (breaths/min): 18 Transportation: Private Auto Blood Pressure (mmHg): 177/86 Accompanied By: self Schedule Follow-up Appointment: Yes Clinical Summary of Care: Electronic Signature(s) Signed: 03/20/2023 5:10:42 PM By: [name] Entered By: [name] on 03/20/2023 08:29:50 -------------------------------------------------------------------------------- Lower Extremity Assessment Details Patient Name: Date of Service: [name] Harold M. 03/20/2023 10:45 A M Medical Record Number: [id number] Patient Account Number: [id number] Date of Birth/Sex:  Treating RN: 1960/11/26 (63 y.o. [name] Primary Care [name]: [name] Other Clinician: Betha [name] Referring [name]: Treating [name]/Extender: [name] in Treatment: 2 Edema Assessment Assessed: [Left: No] [Right: Yes] Edema: [Left: Ye] [Right: s] Calf Left: Right: Point of Measurement: 36 cm From Medial Instep 34.3 cm Ankle Left: Right: Point of Measurement: 13 cm From Medial Instep 23.5 cm Vascular Assessment Pulses: Dorsalis Pedis Palpable: [Right:Yes] Toe Nail Assessment Left: Right: Thick: No Discolored: No Deformed: No Improper Length and Hygiene: No Electronic Signature(s) Signed: 03/20/2023 4:38:23 PM By: Angelina Pih Signed: 03/20/2023 5:10:42 PM By: Keturah Shavers ([id number]) [email or web address] [id number].pdf Page 4 of 9 Signed: 03/20/2023 5:10:42 PM Entered By: [name] on 03/20/2023 08:00:02 -------------------------------------------------------------------------------- Multi Wound Chart Details Patient Name: Date of Service: Harold Mould M. 03/20/2023 10:45 A M Medical Record Number: [id number] Patient Account Number: [id number] Date of Birth/Sex: Treating RN: 12-Sep-1960 (62 y.o. [name] Primary Care Aviona Martenson: [name] Other Clinician: Betha [name] Referring Jaquane Boughner: Treating Oanh Devivo/Extender: [name] in Treatment: 2 Vital Signs Height(in): 68 Pulse(bpm): 56 Weight(lbs): 209 Blood Pressure(mmHg): 177/86 Body Mass Index(BMI): 31.8 Temperature(F): 97.8 Respiratory Rate(breaths/min): 16 [2:Photos:] [N/A:N/A] Right, Plantar Foot N/A N/A Wound Location: Pressure Injury N/A N/A Wounding Event: Diabetic Wound/Ulcer of the Lower N/A N/A Primary Etiology: Extremity Type II Diabetes N/A N/A Comorbid History: 01/02/2023 N/A N/A Date Acquired: 2 N/A N/A Weeks of Treatment: Open  N/A N/A Wound Status: No N/A N/A Wound Recurrence: 0.5x0.2x0.1 N/A N/A Measurements L x W x D (cm) 0.079 N/A N/A A (cm) : rea 0.008 N/A N/A Volume (cm) : -154.80% N/A N/A % Reduction in A rea: -33.30% N/A N/A % Reduction in Volume: Grade 1 N/A N/A Classification: Medium N/A N/A Exudate A mount: Serosanguineous N/A N/A Exudate Type: red, brown N/A N/A Exudate Color: None Present (0%) N/A N/A Granulation A mount: None Present (0%) N/A N/A Necrotic A mount: Fascia: No N/A N/A Exposed Structures: Fat Layer (Subcutaneous Tissue): No Tendon: No Muscle: No Joint: No Bone: No Small (1-33%) N/A N/A Epithelialization: Treatment Notes Electronic Signature(s) Signed: 03/20/2023 5:10:42 PM By: [name] Entered By: [name] on 03/20/2023 08:00:06 [name] ([id number]) [id number]  Page 5 of 9 -------------------------------------------------------------------------------- Multi-Disciplinary Care Plan Details Patient Name: Date of Service: Harold Mote. 03/20/2023 10:45 A M Medical Record Number: [id number] Patient Account Number: [id number] Date of Birth/Sex: Treating RN: 1961-03-16 (62 y.o. [name] Primary Care Genna Casimir: [name] Other Clinician: Betha [name] Referring Chaye Misch: Treating Shannen [place]/Extender: [name] in Treatment: 2 Active Inactive Necrotic Tissue Nursing Diagnoses: Impaired tissue integrity related to necrotic/devitalized tissue Knowledge deficit related to management of necrotic/devitalized tissue Goals: Necrotic/devitalized tissue will be minimized in the wound bed Date Initiated: 03/04/2023 Target Resolution Date: 04/10/2023 Goal Status: Active Patient/caregiver will verbalize understanding of reason and process for debridement of necrotic tissue Date Initiated: 03/04/2023 Target Resolution Date: 04/10/2023 Goal Status:  Active Interventions: Assess patient pain level pre-, during and post procedure and prior to discharge Provide education on necrotic tissue and debridement process Treatment Activities: Excisional debridement : 03/04/2023 Notes: Pressure Nursing Diagnoses: Knowledge deficit related to causes and risk factors for pressure ulcer development Knowledge deficit related to management of pressures ulcers Potential for impaired tissue integrity related to pressure, friction, moisture, and shear Goals: Patient will remain free from development of additional pressure ulcers Date Initiated: 03/04/2023 Target Resolution Date: 04/04/2023 Goal Status: Active Patient will remain free of pressure ulcers Date Initiated: 03/04/2023 Target Resolution Date: 04/04/2023 Goal Status: Active Patient/caregiver will verbalize risk factors for pressure ulcer development Date Initiated: 03/04/2023 Target Resolution Date: 04/04/2023 Goal Status: Active Patient/caregiver will verbalize understanding of pressure ulcer management Date Initiated: 03/04/2023 Target Resolution Date: 04/04/2023 Goal Status: Active Interventions: Assess offloading mechanisms upon admission and as needed Provide education on pressure ulcers Notes: HARSHAN, JOURDAN ([id number]) [id number] Page 6 of 9 Wound/Skin Impairment Nursing Diagnoses: Impaired tissue integrity Knowledge deficit related to ulceration/compromised skin integrity Goals: Patient/caregiver will verbalize understanding of skin care regimen Date Initiated: 03/04/2023 Target Resolution Date: 04/04/2023 Goal Status: Active Ulcer/skin breakdown will have a volume reduction of 30% by week 4 Date Initiated: 03/04/2023 Target Resolution Date: 04/04/2023 Goal Status: Active Ulcer/skin breakdown will have a volume reduction of 50% by week 8 Date Initiated: 03/04/2023 Target Resolution Date: 05/04/2023 Goal Status: Active Ulcer/skin breakdown will  have a volume reduction of 80% by week 12 Date Initiated: 03/04/2023 Target Resolution Date: 06/04/2023 Goal Status: Active Ulcer/skin breakdown will heal within 14 weeks Date Initiated: 03/04/2023 Target Resolution Date: 06/18/2023 Goal Status: Active Interventions: Assess patient/caregiver ability to obtain necessary supplies Assess patient/caregiver ability to perform ulcer/skin care regimen upon admission and as needed Assess ulceration(s) every visit Provide education on ulcer and skin care Treatment Activities: Referred to DME Carole [employer] for dressing supplies : 03/04/2023 Skin care regimen initiated : 03/04/2023 Notes: Electronic Signature(s) Signed: 03/20/2023 4:38:23 PM By: Angelina Pih Signed: 03/20/2023 5:10:42 PM By: [name] Entered By: [name] on 03/20/2023 08:23:04 -------------------------------------------------------------------------------- Pain Assessment Details Patient Name: Date of Service: [name] Harold M. 03/20/2023 10:45 A M Medical Record Number: [id number] Patient Account Number: [id number] Date of Birth/Sex: Treating RN: Aug 26, 1960 (62 y.o. [name] Primary Care Jalyah Weinheimer: [name] Other Clinician: Betha [name] Referring Y[place] Roads: Treating Rukaya Kleinschmidt/Extender: [name] in Treatment: 2 Active Problems Location of Pain Severity and Description of Pain Patient Has Paino No Site Locations AIVAN, VANFOSSAN [place] ([id number]) [id number] Page 7 of 9 Pain Management and Medication Current Pain Management: Electronic Signature(s) Signed: 03/20/2023 4:38:23 PM By: Angelina Pih Signed: 03/20/2023 5:10:42 PM By: [name] Entered By: [name] on 03/20/2023 07:53:17 -------------------------------------------------------------------------------- Patient/Caregiver Education Details Patient Name:  Date of Service: Harold Mould M. 9/5/2024andnbsp10:45 A  M Medical Record Number: [id number] Patient Account Number: [id number] Date of Birth/Gender: Treating RN: 1961/06/26 (62 y.o. [name] Primary Care Physician: [name] Other Clinician: Betha [name] Referring Physician: Treating Physician/Extender: [name] in Treatment: 2 Education Assessment Education Provided To: Patient Education Topics Provided Offloading: Handouts: Other: discussed applying TCC in future for offloading Methods: Explain/Verbal Responses: State content correctly Electronic Signature(s) Signed: 03/20/2023 5:10:42 PM By: [name] Entered By: [name] on 03/20/2023 08:23:43 [name] ([id number]) [id number] Page 8 of 9 -------------------------------------------------------------------------------- Wound Assessment Details Patient Name: Date of Service: Harold Mould M. 03/20/2023 10:45 A M Medical Record Number: [id number] Patient Account Number: [id number] Date of Birth/Sex: Treating RN: 1960-07-20 (62 y.o. [name] Primary Care Jakeob Tullis: [name] Other Clinician: Betha [name] Referring Savaughn Karwowski: Treating Karna Abed/Extender: [name] in Treatment: 2 Wound Status Wound Number: 2 Primary Etiology: Diabetic Wound/Ulcer of the Lower Extremity Wound Location: Right, Plantar Foot Wound Status: Open Wounding Event: Pressure Injury Comorbid History: Type II Diabetes Date Acquired: 01/02/2023 Weeks Of Treatment: 2 Clustered Wound: No Photos Wound Measurements Length: (cm) 0.5 Width: (cm) 0.2 Depth: (cm) 0.1 Area: (cm) 0.079 Volume: (cm) 0.008 % Reduction in Area: -154.8% % Reduction in Volume: -33.3% Epithelialization: Small (1-33%) Tunneling: No Undermining: No Wound Description Classification: Grade 1 Exudate Amount: Medium Exudate Type: Serosanguineous Exudate Color: red, brown Foul Odor  After Cleansing: No Slough/Fibrino No Wound Bed Granulation Amount: None Present (0%) Exposed Structure Necrotic Amount: None Present (0%) Fascia Exposed: No Fat Layer (Subcutaneous Tissue) Exposed: No Tendon Exposed: No Muscle Exposed: No Joint Exposed: No Bone Exposed: No Treatment Notes Wound #2 (Foot) Wound Laterality: Plantar, Right Cleanser Soap and Water Discharge Instruction: Gently cleanse wound with antibacterial soap, rinse and pat dry prior to dressing wounds Peri-Wound Care JD, MCMAKIN ([id number]) [id number] Page 9 of 9 Topical Primary Dressing Hydrofera Blue Ready Transfer Foam, 2.5x2.5 (in/in) Discharge Instruction: Apply Hydrofera Blue Ready to wound bed as directed Secondary Dressing (BORDER) Zetuvit Plus SILICONE BORDER Dressing 4x4 (in/in) Discharge Instruction: Please do not put silicone bordered dressings under wraps. Use non-bordered dressing only. Secured With Compression Wrap Compression Stockings Facilities manager) Signed: 03/20/2023 4:38:23 PM By: Angelina Pih Signed: 03/20/2023 5:10:42 PM By: [name] Entered By: [name] on 03/20/2023 07:58:41 -------------------------------------------------------------------------------- Vitals Details Patient Name: Date of Service: [place] Breeding, JO Harold M. 03/20/2023 10:45 A M Medical Record Number: [id number] Patient Account Number: [id number] Date of Birth/Sex: Treating RN: [birth year] (62 y.o. [name] Primary Care Alexandrea Westergard: [name] Other Clinician: Betha [name] Referring Refugio Vandevoorde: Treating Carols Clemence/Extender: [name] in Treatment: 2 Vital Signs Time Taken: 10:50 Temperature (F): 97.8 Height (in): 68 Pulse (bpm): 56 Weight (lbs): 209 Respiratory Rate (breaths/min): 16 Body Mass Index (BMI): 31.8 Blood Pressure (mmHg): 177/86 Reference Range: 80 - 120 mg / dl Electronic  Signature(s) Signed: 03/20/2023 5:10:42 PM By: [name] Entered By: [name] on 03/20/2023 07:53:13

## 2023-03-20 NOTE — Progress Notes (Addendum)
LOGANN, FLAM [place] ([id number]) [id number] Page 1 of 8 Visit Report for 03/20/2023 Chief Complaint Document Details Patient Name: Date of Service: [name] [facility] M. 03/20/2023 10:45 A M Medical Record Number: [id number] Patient Account Number: [id number] Date of Birth/Sex: Treating RN: 05-Nov-1960 (62 y.o. [name] Primary Care Provider: [name] Other Clinician: Betha Loa Referring Provider: Treating Provider/Extender: [name] in Treatment: 2 Information Obtained from: Patient Chief Complaint Right plantar foot ulcer Electronic Signature(s) Signed: 03/20/2023 10:47:39 AM By: Allen Derry PA-C Entered By: Allen Derry on 03/20/2023 07:47:39 -------------------------------------------------------------------------------- Debridement Details Patient Name: Date of Service: [name] HN M. 03/20/2023 10:45 A M Medical Record Number: [id number] Patient Account Number: [id number] Date of Birth/Sex: Treating RN: September 01, 1960 (62 y.o. [name] Primary Care Provider: [name] Other Clinician: Betha Loa Referring Provider: Treating Provider/Extender: [name] in Treatment: 2 Debridement Performed for Assessment: Wound #2 Right,Plantar Foot Performed By: Physician Allen Derry, PA-C Debridement Type: Debridement Severity of Tissue Pre Debridement: Fat layer exposed Level of Consciousness (Pre-procedure): Awake and Alert Pre-procedure Verification/Time Out Yes - 11:19 Taken: Start Time: 11:19 Percent of Wound Bed Debrided: 100% T Area Debrided (cm): otal 0.08 Tissue and other material debrided: Viable, Non-Viable, Callus, Slough, Subcutaneous, Slough Level: Skin/Subcutaneous Tissue Debridement Description: Excisional Instrument: Curette Bleeding: Minimum Hemostasis Achieved: Pressure Response to Treatment: Procedure was tolerated  well Level of Consciousness (Post- Awake and Alert procedure): [name], [name] ([id number]) [id number] Page 2 of 8 Post Debridement Measurements of Total Wound Length: (cm) 0.5 Width: (cm) 0.2 Depth: (cm) 0.2 Volume: (cm) 0.016 Character of Wound/Ulcer Post Debridement: Improved Severity of Tissue Post Debridement: Fat layer exposed Post Procedure Diagnosis Same as Pre-procedure Electronic Signature(s) Signed: 03/20/2023 4:38:23 PM By: Angelina Pih Signed: 03/20/2023 5:10:42 PM By: Betha Loa Signed: 03/21/2023 2:02:35 PM By: Allen Derry PA-C Entered By: Betha Loa on 03/20/2023 08:20:45 -------------------------------------------------------------------------------- HPI Details Patient Name: Date of Service: [name] HN M. 03/20/2023 10:45 A M Medical Record Number: [id number] Patient Account Number: [id number] Date of Birth/Sex: Treating RN: November 11, 1960 (62 y.o. [name] Primary Care Provider: [name] Other Clinician: Betha Loa Referring Provider: Treating Provider/Extender: [name] in Treatment: 2 History of Present Illness Chronic/Inactive Conditions Condition 1: 03-04-2023 patient's arterial screening today actually appears to show some signs of being somewhat low at 0.83 on the left 1.53 on the right which is essentially noncompressible. I really do not think we can assure ourselves of these readings and I think he really should see vascular for a thorough arterial evaluation in order to ensure that he has good blood flow although I suspect he probably does. HPI Description: 07/20/17 on evaluation today patient actually appears to be doing somewhat poorly in regard to his right plantar great toe ulcer. This is something that he states started as what he thought to be a blister May 04, 2018. Subsequently he treated this on his own for a while thinking that it would just get  better. As it did not get better he eventually did go to have this evaluated where it was treated appropriately with it sounds like Bactroban ointment however the patient states that it never completely resolved. Subsequently if this continue to get worse and was looking more irritated and inflamed he did go to the hospital more recently where he was prescribed Keflex. Subsequently he was also referred to the wound care center here for further evaluation by [place]. He had an  x-ray in the ER which revealed no evidence of osteomyelitis that I explained to the patient that osteomyelitis can be present even in a negative x- ray as it's not nearly as sensitive as an MRI would be. No fevers, chills, nausea, or vomiting noted at this time. The patient tells me that this might look a little bit better since he started the antibiotics. He does have a history of diabetes mellitus type II he also had a horse accident years ago and had to have significant surgery in regard to his right lower extremity that did leave some deformity in this regard. This is mainly centered in the shin region. 07/27/18 upon evaluation today patient appears to be doing fairly well in regard to his toe ulcer all things considered. 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 This obviously is good news. With that being said I do think that he did have some infection which obviously calls some of the necrosis although the wound seems to be doing better he is definitely tolerating the Cipro which I switched into based on the bone culture and seems to be dramatically improving in this regard. Overall I'm pleased with how things stand. 08/03/18 on evaluation today patient appears to be doing rather well in regard to his toe ulcer all things considering. Again at this point the patient's wound shows signs of  improvement although this is slow it still seems to be doing very well in my pinion. Fortunately there does not seem to show any signs of infection as far as the overall appearance of the wound bed is concerned at least nothing worsening. Overall the redness and erythema has dramatically improved. I'm very pleased. Patient does have his infectious disease consult on February 3 and his MRI is actually scheduled for the 23rd of this month which is just three days away. 08/10/18 on evaluation today patient actually appears to be doing okay in regard to his toe ulcer. It does not appear to be as inflamed as it was previous. 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 He still has bone exposed in the base of the wound. I am concerned about how well this is actually going to heal or not at this point. Overall I do believe that the Cipro has been of benefit for him and I will continue this one more week until he sees infectious disease next Monday. I'm also gonna see about however getting appointment for him to see podiatry to discuss his options. 08/18/18 on evaluation today patient's wound actually appears to be doing decently well. He does have bone noted at this point in the base of the wound still although there does not appear to be as much necrotic bone as there was previous. He has been tolerating the Cipro without any complications. He did subsequently have an appointment with infectious disease which was supposed to be yesterday. However they called him when he was getting the car to leave to go in order to tell him that they would not be able to see him due to the doctor being out and rescheduled him for the 24th which  is three weeks away. T be WILLMON, [name]  ([id number]) [id number] Page 3 of 8 honest I'm not very pleased with that scenario. Nonetheless the patient tells me as well today that he is not even sure that he would want to go see a podiatrist at this point he really wants to do whatever he can to avoid surgery. 08/25/18 on evaluation today patient's wound continues to show signs of necrotic bone in the base of the wound as well is necrotic tissue in general. He does still have your theme of the great toe he's currently on the oral antibiotics while we are still awaiting the referral to infectious disease which is scheduled for the 24th of this month. At this point I did have a longer conversation today with him concerning hyperbaric option therapy and how considering he doesn't want to have any surgery wants to try to manage this medically it could potentially help in getting this to resolve. Fortunately there does not appear to be any signs of infection at this time systemically. No fevers, chills, nausea, or vomiting noted at this time. He does have a history of having had some heart disease with what sounds to be potentially stand I would have to get an updated EKG prior to HBO therapy. Nonetheless he is not a smoker and has no lung conditions. I'm not sure if you set in echo but if not we may have to see about this as well prior to initiating therapy to ensure his ejection fraction is sufficient. I will look further into this as well. 09/01/18 on evaluation today patient's toe actually appears to be doing about the same may be slightly better in regard to the overall quality of the wound bed although he still has bone exposed some of this will still requiring debridement today due to be necrotic on the tip. Fortunately there's no signs of worsening infection. We did get approval for hyperbaric oxygen therapy for the patient although he states that he thought about it and really does not want to do that. He would like  to see infectious disease first at least and he has an appointment with them on Monday. Subsequently following that appointment I'll see him on Tuesday and then we'll have a longer discussion about what we're gonna do. His toe again also showed a pathologic fracture and I think this is also a big part of the issue as well. Again as I explained to the patient today and have previous I think that the hyperbaric oxygen therapy would be of great benefit for him as far as trying to clear out the infection especially in light of the fact that he really doesn't seem to be progressing quite as well as I would like is still has necrotic bone noted in the base of the wound. Obviously IV antibiotics will help as well I have referred him to fix disease unfortunately the beginning of month is appointment got pushed to the end of this month that actually is on Monday. Subsequently we're gonna see were things stand once they see him. Readmission: 03-04-2023 upon evaluation today patient appears to be doing somewhat poorly today in regard to the wound on the right plantar foot. 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 With that being said the patient tells me at this point that he has  been treating this just at home using some Betadine on the area but really nothing else has been done up to this point. This is actually his first office for his care is concerned in a clinic setting. Patient's past medical history actually is very similar to previous when I saw him. He does have a history of diabetes mellitus type 2, peripheral vascular disease, chronic kidney disease stage IV, hypertension, he has not had any formal arterial testing up to this point. Patient is likely going require an x-ray today. 8/27; patient was seen for the first time last week he has a wound over  the metatarsal head on the right foot. 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 Allena Katz of podiatry. 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 He has been using Hydrofera Blue and sit to vet to the wound area. He had an x-ray done that showed faint sclerosis of the head of the second metatarsal which was nonspecific but new from 07/16/2018 which could be posttraumatic secondary to avascular necrosis or chronic infection 03-20-2023 upon evaluation today patient appears to be doing pretty well currently in regard to the plantar aspect of his foot. This is actually showing signs of improvement which is good news. So far I am seeing enough improvement and I am pretty pleased with where we are at although I did tell him that if he does not continue to see ongoing size improvements that we may want to consider a total contact cast. Electronic Signature(s) Signed: 03/20/2023 1:03:36 PM By: Allen Derry PA-C Entered By: Allen Derry on 03/20/2023 10:03:36 -------------------------------------------------------------------------------- Physical Exam Details Patient Name: Date of Service: [name] HN M. 03/20/2023 10:45 A M Medical Record Number: [id number] Patient Account Number: [id number] Date of Birth/Sex: Treating RN: 1961/05/08 (63 y.o. [name] Primary Care Provider: [name] Other Clinician: Betha Loa Referring Provider: Treating Provider/Extender: [name] in Treatment: 2 Constitutional Well-nourished and well-hydrated in no acute distress. Respiratory normal breathing without difficulty. Psychiatric this patient is able to make decisions and demonstrates good insight into disease process. Alert and Oriented x 3. pleasant and cooperative. Notes Upon inspection patient's wound bed actually showed signs of good granulation epithelization at this point. Fortunately I do  not see any signs of worsening [name], [name] ([id number]) [id number] Page 4 of 8 overall and I believe that the patient is making good headway towards closure. No fevers, chills, nausea, vomiting, or diarrhea. Electronic Signature(s) Signed: 03/20/2023 1:04:00 PM By: Allen Derry PA-C Entered By: Allen Derry on 03/20/2023 10:04:00 -------------------------------------------------------------------------------- Physician Orders Details Patient Name: Date of Service: [name] HN M. 03/20/2023 10:45 A M Medical Record Number: [id number] Patient Account Number: [id number] Date of Birth/Sex: Treating RN: [birth year] (62 y.o. [name] Primary Care Provider: [name] Other Clinician: Betha Loa Referring Provider: Treating Provider/Extender: [name] in Treatment: 2 Verbal / Phone Orders: Yes Clinician: Angelina Pih Read Back and Verified: Yes Diagnosis Coding ICD-10 Coding Code Description E11.621 Type 2 diabetes mellitus with foot ulcer L97.512 Non-pressure chronic ulcer of other part of right foot with fat layer exposed I73.89 Other specified peripheral vascular diseases N18.4 Chronic kidney disease, stage 4 (severe) I10 Essential (primary) hypertension Follow-up Appointments Return Appointment in 1 week. Bathing/ Shower/ Hygiene May shower; gently cleanse wound with antibacterial soap, rinse and pat dry prior to dressing wounds Anesthetic (Use 'Patient Medications' Section for  Anesthetic Order Entry) Lidocaine applied to wound bed Off-Loading Open toe surgical shoe with peg assist. Wound Treatment Wound #2 - Foot Wound Laterality: Plantar, Right Cleanser: Soap and Water 3 x Per Week/30 Days Discharge Instructions: Gently cleanse wound with antibacterial soap, rinse and pat dry prior to dressing wounds Prim Dressing: Hydrofera Blue Ready Transfer Foam, 2.5x2.5 (in/in) (Dispense As  Written) 3 x Per Week/30 Days ary Discharge Instructions: Apply Hydrofera Blue Ready to wound bed as directed Secondary Dressing: (BORDER) Zetuvit Plus SILICONE BORDER Dressing 4x4 (in/in) (Generic) 3 x Per Week/30 Days Discharge Instructions: Please do not put silicone bordered dressings under wraps. Use non-bordered dressing only. Electronic Signature(s) Signed: 03/20/2023 5:10:42 PM By: Betha Loa Signed: 03/21/2023 2:02:35 PM By: Allen Derry PA-C Entered By: Betha Loa on 03/20/2023 08:21:08 [name] ([id number]) [id number] Page 5 of 8 -------------------------------------------------------------------------------- Problem List Details Patient Name: Date of Service: [name] 03/20/2023 10:45 A M Medical Record Number: [id number] Patient Account Number: [id number] Date of Birth/Sex: Treating RN: 1961/05/25 (62 y.o. [name] Primary Care Provider: [name] Other Clinician: Betha Loa Referring Provider: Treating Provider/Extender: [name] in Treatment: 2 Active Problems ICD-10 Encounter Code Description Active Date MDM Diagnosis E11.621 Type 2 diabetes mellitus with foot ulcer 03/04/2023 No Yes L97.512 Non-pressure chronic ulcer of other part of right foot with fat layer exposed 03/04/2023 No Yes I73.89 Other specified peripheral vascular diseases 03/04/2023 No Yes N18.4 Chronic kidney disease, stage 4 (severe) 03/04/2023 No Yes I10 Essential (primary) hypertension 03/04/2023 No Yes Inactive Problems Resolved Problems Electronic Signature(s) Signed: 03/20/2023 10:47:35 AM By: Allen Derry PA-C Entered By: Allen Derry on 03/20/2023 07:47:35 -------------------------------------------------------------------------------- Progress Note Details Patient Name: Date of Service: [name] HN M. 03/20/2023 10:45 A M Medical Record Number: [id number] Patient Account Number:  [id number] Date of Birth/Sex: Treating RN: 08/22/1960 (62 y.o. [name] Primary Care Provider: [name] Other Clinician: Tha, Joshlin ([id number]) [id number] Page 6 of 8 Referring Provider: Treating Provider/Extender: [name] in Treatment: 2 Subjective Chief Complaint Information obtained from Patient Right plantar foot ulcer History of Present Illness (HPI) Chronic/Inactive Condition: 03-04-2023 patient's arterial screening today actually appears to show some signs of being somewhat low at 0.83 on the left 1.53 on the right which is essentially noncompressible. I really do not think we can assure ourselves of these readings and I think he really should see vascular for a thorough arterial evaluation in order to ensure that he has good blood flow although I suspect he probably does. 07/20/17 on evaluation today patient actually appears to be doing somewhat poorly in regard to his right plantar great toe ulcer. This is something that he states started as what he thought to be a blister May 04, 2018. Subsequently he treated this on his own for a while thinking that it would just get better. As it did not get better he eventually did go to have this evaluated where it was treated appropriately with it sounds like Bactroban ointment however the patient states that it never completely resolved. Subsequently if this continue to get worse and was looking more irritated and inflamed he did go to the hospital more recently where he was prescribed Keflex. Subsequently he was also referred to the wound care center here for further evaluation by [place]. He had an x-ray in the ER which revealed no evidence of osteomyelitis that I explained to the patient that osteomyelitis can be present  even in a negative x-ray as it's not nearly as sensitive as an MRI would be. No fevers, chills, nausea, or vomiting  noted at this time. The patient tells me that this might look a little bit better since he started the antibiotics. He does have a history of diabetes mellitus type II he also had a horse accident years ago and had to have significant surgery in regard to his right lower extremity that did leave some deformity in this regard. This is mainly centered in the shin region. 07/27/18 upon evaluation today patient appears to be doing fairly well in regard to his toe ulcer all things considered. 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 This obviously is good news. With that being said I do think that he did have some infection which obviously calls some of the necrosis although the wound seems to be doing better he is definitely tolerating the Cipro which I switched into based on the bone culture and seems to be dramatically improving in this regard. Overall I'm pleased with how things stand. 08/03/18 on evaluation today patient appears to be doing rather well in regard to his toe ulcer all things considering. Again at this point the patient's wound shows signs of improvement although this is slow it still seems to be doing very well in my pinion. Fortunately there does not seem to show any signs of infection as far as the overall appearance of the wound bed is concerned at least nothing worsening. Overall the redness and erythema has dramatically improved. I'm very pleased. Patient does have his infectious disease consult on February 3 and his MRI is actually scheduled for the 23rd of this month which is just three days away. 08/10/18 on evaluation today patient actually appears to be doing okay in regard to his toe ulcer. It does not appear to be as inflamed as it was previous. 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 He still has bone exposed in the base of the wound. I am concerned about how well this is actually going to heal or not at this point. Overall I do believe that the Cipro has been of benefit for him and I will continue this one more week until he sees infectious disease next Monday. I'm also gonna see about however getting appointment for him to see podiatry to discuss his options. 08/18/18 on evaluation today patient's wound actually appears to be doing decently well. He does have bone noted at this point in the base of the wound still although there does not appear to be as much necrotic bone as there was previous. He has been tolerating the Cipro without any complications. He did subsequently have an appointment with infectious disease which was supposed to be yesterday. However they called him when he was getting the car to leave to go in order to tell him that they would not be able to see him due to the doctor being out and rescheduled him for the 24th which is three weeks away. T be o honest I'm not very pleased with that scenario. Nonetheless the patient tells me as  well today that he is not even sure that he would want to go see a podiatrist at this point he really wants to do whatever he can to avoid surgery. 08/25/18 on evaluation today patient's wound continues to show signs of necrotic bone in the base of the wound as well is necrotic tissue in general. He does still have your theme of the great toe he's currently on the oral antibiotics while we are still awaiting the referral to infectious disease which is scheduled for the 24th of this month. At this point I did have a longer conversation today with him concerning hyperbaric option therapy and how considering he doesn't want to have any surgery wants to try  to manage this medically it could potentially help in getting this to resolve. Fortunately there does not appear to be any signs of infection at this time systemically. No fevers, chills, nausea, or vomiting noted at this time. He does have a history of having had some heart disease with what sounds to be potentially stand I would have to get an updated EKG prior to HBO therapy. Nonetheless he is not a smoker and has no lung conditions. I'm not sure if you set in echo but if not we may have to see about this as well prior to initiating therapy to ensure his ejection fraction is sufficient. I will look further into this as well. 09/01/18 on evaluation today patient's toe actually appears to be doing about the same may be slightly better in regard to the overall quality of the wound bed although he still has bone exposed some of this will still requiring debridement today due to be necrotic on the tip. Fortunately there's no signs of worsening infection. We did get approval for hyperbaric oxygen therapy for the patient although he states that he thought about it and really does not want to do that. He would like to see infectious disease first at least and he has an appointment with them on Monday. Subsequently following that appointment I'll see him on Tuesday and then we'll have a longer discussion about what we're gonna do. His toe again also showed a pathologic fracture and I think this is also a big part of the issue as well. Again as I explained to the patient today and have previous I think that the hyperbaric oxygen therapy would be of great benefit for him as far as trying to clear out the infection especially in light of the fact that he really doesn't seem to be progressing quite as well as I would like is still has necrotic bone noted in the base of the wound. Obviously IV antibiotics will help as well I have referred him to fix disease unfortunately the beginning of month is appointment got  pushed to the end of this month that actually is on Monday. Subsequently we're gonna see were things stand once they see him. Readmission: 03-04-2023 upon evaluation today patient appears to be doing somewhat poorly today in regard to the wound on the right plantar foot. 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 With that being said the patient tells me at this point that he has been treating this just at home using some Betadine on the area but really nothing else has been done up to this point. This is actually his first office for  his care is concerned in a clinic setting. Patient's past medical history actually is very similar to previous when I saw him. He does have a history of diabetes mellitus type 2, peripheral vascular disease, chronic kidney disease stage IV, hypertension, he has not had any formal arterial testing up to this point. Patient is likely going require an x-ray today. 8/27; patient was seen for the first time last week he has a wound over the metatarsal head on the right foot. 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 Allena Katz of podiatry. 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 He has been using Hydrofera Blue and sit to vet to the wound area. He had an x-ray done that showed faint sclerosis of the head of the second metatarsal which was nonspecific but new from 07/16/2018 which could be posttraumatic secondary to avascular necrosis or chronic infection 03-20-2023 upon evaluation today patient appears to be doing pretty well currently in regard to the plantar aspect of his foot. This is actually showing signs of [name], [name] ([id number]) [id number] Page 7 of 8 improvement which is good news. So far I am seeing enough improvement and I  am pretty pleased with where we are at although I did tell him that if he does not continue to see ongoing size improvements that we may want to consider a total contact cast. Objective Constitutional Well-nourished and well-hydrated in no acute distress. Vitals Time Taken: 10:50 AM, Height: 68 in, Weight: 209 lbs, BMI: 31.8, Temperature: 97.8 F, Pulse: 56 bpm, Respiratory Rate: 16 breaths/min, Blood Pressure: 177/86 mmHg. Respiratory normal breathing without difficulty. Psychiatric this patient is able to make decisions and demonstrates good insight into disease process. Alert and Oriented x 3. pleasant and cooperative. General Notes: Upon inspection patient's wound bed actually showed signs of good granulation epithelization at this point. Fortunately I do not see any signs of worsening overall and I believe that the patient is making good headway towards closure. No fevers, chills, nausea, vomiting, or diarrhea. Integumentary (Hair, Skin) Wound #2 status is Open. Original cause of wound was Pressure Injury. The date acquired was: 01/02/2023. The wound has been in treatment 2 weeks. The wound is located on the Right,Plantar Foot. The wound measures 0.5cm length x 0.2cm width x 0.1cm depth; 0.079cm^2 area and 0.008cm^3 volume. There is no tunneling or undermining noted. There is a medium amount of serosanguineous drainage noted. There is no granulation within the wound bed. There is no necrotic tissue within the wound bed. Assessment Active Problems ICD-10 Type 2 diabetes mellitus with foot ulcer Non-pressure chronic ulcer of other part of right foot with fat layer exposed Other specified peripheral vascular diseases Chronic kidney disease, stage 4 (severe) Essential (primary) hypertension Procedures Wound #2 Pre-procedure diagnosis of Wound #2 is a Diabetic Wound/Ulcer of the Lower Extremity located on the Right,Plantar Foot .Severity of Tissue Pre Debridement is: Fat layer exposed.  There was a Excisional Skin/Subcutaneous Tissue Debridement with a total area of 0.08 sq cm performed by Allen Derry, PA-C. With the following instrument(s): Curette to remove Viable and Non-Viable tissue/material. Material removed includes Callus, Subcutaneous Tissue, and Slough. A time out was conducted at 11:19, prior to the start of the procedure. A Minimum amount of bleeding was controlled with Pressure. The procedure was tolerated well. Post Debridement Measurements: 0.5cm length x 0.2cm width x 0.2cm depth; 0.016cm^3 volume. Character of Wound/Ulcer Post Debridement is improved. Severity  of Tissue Post Debridement is: Fat layer exposed. Post procedure Diagnosis Wound #2: Same as Pre-Procedure Plan Follow-up Appointments: Return Appointment in 1 week. Bathing/ Shower/ Hygiene: May shower; gently cleanse wound with antibacterial soap, rinse and pat dry prior to dressing wounds Anesthetic (Use 'Patient Medications' Section for Anesthetic Order Entry): Lidocaine applied to wound bed Off-Loading: Open toe surgical shoe with peg assist. WOUND #2: - Foot Wound Laterality: Plantar, Right Cleanser: Soap and Water 3 x Per Week/30 Days Discharge Instructions: Gently cleanse wound with antibacterial soap, rinse and pat dry prior to dressing wounds Prim Dressing: Hydrofera Blue Ready Transfer Foam, 2.5x2.5 (in/in) (Dispense As Written) 3 x Per Week/30 Days ary Discharge Instructions: Apply Hydrofera Blue Ready to wound bed as directed Secondary Dressing: (BORDER) Zetuvit Plus SILICONE BORDER Dressing 4x4 (in/in) (Generic) 3 x Per Week/30 Days [name], [name] ([id number]) [id number] Page 8 of 8 Discharge Instructions: Please do not put silicone bordered dressings under wraps. Use non-bordered dressing only. 1. I am going to recommend that the patient should continue to monitor for any signs of infection or worsening. Based on what I see I do believe that he  is tolerating the dressing changes without complication. 2. I am also can recommend that the patient should continue with the offloading shoe. 3. I am also can recommend that we continue specifically with the [facility] followed by bordered foam dressing. We will see patient back for reevaluation in 1 week here in the clinic. If anything worsens or changes patient will contact our office for additional recommendations. If he is not doing significantly better again next week or if he stalls I would recommend a total contact cast as a consideration here. Electronic Signature(s) Signed: 03/20/2023 1:04:30 PM By: Allen Derry PA-C Entered By: Allen Derry on 03/20/2023 10:04:30 -------------------------------------------------------------------------------- SuperBill Details Patient Name: Date of Service: [place] Breeding, JO HN M. 03/20/2023 Medical Record Number: [id number] Patient Account Number: [id number] Date of Birth/Sex: Treating RN: [birth year] (62 y.o. [name] Primary Care Provider: [name] Other Clinician: Betha Loa Referring Provider: Treating Provider/Extender: [name] in Treatment: 2 Diagnosis Coding ICD-10 Codes Code Description [phone] Type 2 diabetes mellitus with foot ulcer L97.512 Non-pressure chronic ulcer of other part of right foot with fat layer exposed I73.89 Other specified peripheral vascular diseases N18.4 Chronic kidney disease, stage 4 (severe) I10 Essential (primary) hypertension Facility Procedures : CPT4 Code: [id number] Description: 11042 - DEB SUBQ TISSUE 20 SQ CM/< ICD-10 Diagnosis Description L97.512 Non-pressure chronic ulcer of other part of right foot with fat layer exposed Modifier: Quantity: 1 Physician Procedures : CPT4 Code Description Modifier [id number] 11042 - WC PHYS SUBQ TISS 20 SQ CM ICD-10 Diagnosis Description L97.512 Non-pressure chronic ulcer of other part of right foot with fat  layer exposed Quantity: 1 Electronic Signature(s) Signed: 03/20/2023 1:04:43 PM By: Allen Derry PA-C Entered By: Allen Derry on 03/20/2023 10:04:43

## 2023-03-21 ENCOUNTER — Other Ambulatory Visit (INDEPENDENT_AMBULATORY_CARE_PROVIDER_SITE_OTHER): Payer: Self-pay | Admitting: Physician Assistant

## 2023-03-21 DIAGNOSIS — E11621 Type 2 diabetes mellitus with foot ulcer: Secondary | ICD-10-CM

## 2023-03-21 DIAGNOSIS — L97512 Non-pressure chronic ulcer of other part of right foot with fat layer exposed: Secondary | ICD-10-CM

## 2023-03-26 ENCOUNTER — Other Ambulatory Visit: Payer: Self-pay | Admitting: Cardiovascular Disease

## 2023-03-26 NOTE — Progress Notes (Signed)
[facility] - HEART FAILURE CLINIC - PHARMACIST COUNSELING NOTE  Guideline-Directed Medical Therapy/Evidence Based Medicine  ACE/ARB/ARNI: Losartan 25 mg daily Beta Blocker: Carvedilol 6.25 mg twice daily Aldosterone Antagonist:  none d/t CKD 4 Diuretic: Furosemide 40 mg daily SGLT2i:  none - rash with Jardiance  Adherence Assessment  Do you ever forget to take your medication? [] Yes [x] No  Do you ever skip doses due to side effects? [] Yes [x] No  Do you have trouble affording your medicines? [] Yes [x] No  Are you ever unable to pick up your medication due to transportation difficulties? [] Yes [x] No  Do you ever stop taking your medications because you don't believe they are helping? [] Yes [x] No  Do you check your weight daily? [] Yes [x] No   Adherence strategy: list  Barriers to obtaining medications: none  Vital signs: HR 57, BP 142/77, weight (pounds) 211 lb  Echo 08/08/22 EF 55-60%   Echo 02/19/23: EF 50-55% with mild LVH, Grade II DD and mild/ moderate MR     Latest Ref Rng & Units 02/23/2023    6:17 AM 02/22/2023    5:04 AM 02/21/2023    3:48 AM  BMP  Glucose 70 - 99 mg/dL 604  540  981   BUN 8 - 23 mg/dL 80  81  83   Creatinine 0.61 - 1.24 mg/dL 1.91  4.78  2.95   Sodium 135 - 145 mmol/L 136  139  140   Potassium 3.5 - 5.1 mmol/L 4.4  4.3  4.7   Chloride 98 - 111 mmol/L 103  105  107   CO2 22 - 32 mmol/L 25  25  25    Calcium 8.9 - 10.3 mg/dL 8.1  8.3  8.0     Past Medical History:  Diagnosis Date   Ankle pain    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 10/2016 Echo: EF 25-30%, diff HK; b. 01/2017 Echo:  EF 40-45%, GrI DD; c. 08/2017 Echo: EF 40-45%, GrI DD; d. 08/2020 Echo: EF 45-50%, glob HK. Mod asymm LVH, GrI DD, Nl RV size/fxn; e. 08/2020 PYP: equivocal for ATTR cardiac amyloid.   CKD (chronic kidney disease), stage III - IV (HCC)    Coronary artery disease    a. 10/2016 Cath/PCI: LAD  37m (3.5x15 Xience Alpine DES). No other obstructive disease.   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 10/2016 Echo: EF 25-30%, diff HK; b. 01/2017 Echo:  EF 40-45%, GrI DD; c. 08/2017 Echo: EF 40-45%, GrI DD; d. 08/2020 Echo: EF 45-50%, glob HK. Mod asymm LVH, GrI DD, Nl RV size/fxn.   Pain in both feet     ASSESSMENT 62 year old male who presents to the HF clinic for initial visit. PMH includes hyperlipidemia, CAD w/ PCI and DES 10/2016, chronic systolic HF, DM2, HTN, CKD IV, ETOH abuse and PAD. Lats admission was 2 weeks ago (02/19/2023) due to acute HF exacerbation.   Medrec completed during office visit. No barriers identified to obtain medication. Patient will like to take less pills if possible. GDMT limited due to CKD4. Patient repots multiple epidosed of low BG in the 50s while taking lantus int ehe evenings.  PLAN (Recommendations)  Change Lantus dose to AM administration and discuss dose adjustments with PCP Decrease hydralazine from 100mg  TID to 100mg  BID Change losartan 25mg  daily to valsartan 80mg  daily Consider  wean off clonidine ASAP Consider dc doxazosin if BP controlled. Repeat BMET in 1 week Follow up per NP instructions  Time spent: 20 minutes  Kennedi Lizardo Rodriguez-Guzman PharmD, BCPS 03/26/2023 9:05 AM   Current Outpatient Medications:    ACCU-CHEK FASTCLIX LANCETS MISC, Check sugar up to 3 x daily as instructed, Disp: 102 each, Rfl: 12   ACCU-CHEK GUIDE test strip, Check blood sugar up to 3 times daily as advised, Disp: 100 each, Rfl: 12   acetaminophen (TYLENOL 8 HOUR) 650 MG CR tablet, Take 1 tablet (650 mg total) by mouth every 8 (eight) hours as needed for pain., Disp: , Rfl:    aspirin EC 81 MG tablet, Take 81 mg by mouth daily., Disp: , Rfl:    atorvastatin (LIPITOR) 40 MG tablet, TAKE 1 TABLET BY MOUTH EVERY DAY, Disp: 90 tablet, Rfl: 1   carvedilol (COREG) 6.25 MG tablet, Take  1 tablet (6.25 mg total) by mouth 2 (two) times daily with a meal., Disp: 60 tablet, Rfl: 1   doxazosin (CARDURA) 1 MG tablet, TAKE 2 TABLETS (2 MG TOTAL) BY MOUTH AT BEDTIME., Disp: 180 tablet, Rfl: 1   furosemide (LASIX) 40 MG tablet, Take 1 tablet (40 mg total) by mouth daily., Disp: 30 tablet, Rfl: 1   gabapentin (NEURONTIN) 400 MG capsule, Take 1 capsule (400 mg total) by mouth 2 (two) times daily. (Patient not taking: Reported on 03/13/2023), Disp: 360 capsule, Rfl: 1   GVOKE HYPOPEN 2-PACK 1 MG/0.2ML SOAJ, Inject 1 mg into the skin as needed (hypoglycemia)., Disp: 1 mL, Rfl: 2   hydrALAZINE (APRESOLINE) 100 MG tablet, Take 1 tablet (100 mg total) by mouth 2 (two) times daily., Disp: , Rfl:    Insulin Pen Needle (B-D ULTRAFINE III SHORT PEN) 31G X 8 MM MISC, USE TO INJECT INSULIN NIGHTLY, Disp: 100 each, Rfl: 6   isosorbide mononitrate (IMDUR) 60 MG 24 hr tablet, TAKE 1 TABLET BY MOUTH 2 TIMES DAILY., Disp: 180 tablet, Rfl: 1   Lancets Misc. MISC, Use  Brand compatable to insurance and monitor to check blood sugar up to 3 times daily. ICD10 E11.9, Disp: 100 each, Rfl: 12   LANTUS SOLOSTAR 100 UNIT/ML Solostar Pen, INJECT 40 UNITS INTO THE SKIN AT BEDTIME., Disp: 45 mL, Rfl: 1   valsartan (DIOVAN) 80 MG tablet, Take 1 tablet (80 mg total) by mouth daily., Disp: 30 tablet, Rfl: 3

## 2023-03-27 ENCOUNTER — Telehealth: Payer: Self-pay | Admitting: Family Medicine

## 2023-03-27 ENCOUNTER — Encounter: Payer: Medicaid Other | Admitting: Physician Assistant

## 2023-03-27 DIAGNOSIS — E11622 Type 2 diabetes mellitus with other skin ulcer: Secondary | ICD-10-CM | POA: Diagnosis not present

## 2023-03-27 DIAGNOSIS — N184 Chronic kidney disease, stage 4 (severe): Secondary | ICD-10-CM | POA: Diagnosis not present

## 2023-03-27 DIAGNOSIS — L97512 Non-pressure chronic ulcer of other part of right foot with fat layer exposed: Secondary | ICD-10-CM | POA: Diagnosis not present

## 2023-03-27 DIAGNOSIS — I129 Hypertensive chronic kidney disease with stage 1 through stage 4 chronic kidney disease, or unspecified chronic kidney disease: Secondary | ICD-10-CM | POA: Diagnosis not present

## 2023-03-27 DIAGNOSIS — E1122 Type 2 diabetes mellitus with diabetic chronic kidney disease: Secondary | ICD-10-CM | POA: Diagnosis not present

## 2023-03-27 DIAGNOSIS — E11621 Type 2 diabetes mellitus with foot ulcer: Secondary | ICD-10-CM | POA: Diagnosis not present

## 2023-03-27 NOTE — Telephone Encounter (Signed)
Medication Refill - Medication:  furosemide (LASIX) 40 MG tablet  and gabapentin (NEURONTIN) 400 MG capsule   Has the patient contacted their pharmacy? Yes.   No refills  Preferred Pharmacy (with phone number or street name):  [id number] - [place], [place] - [address] Phone: [phone]  Fax: [phone]     Has the patient been seen for an appointment in the last year OR does the patient have an upcoming appointment? Yes.    Agent: Please be advised that RX refills may take up to 3 business days. We ask that you follow-up with your pharmacy.

## 2023-03-27 NOTE — Progress Notes (Addendum)
ZAYVON, YELLE [place] ([id number]) [id number] Page 1 of 9 Visit Report for 03/27/2023 Arrival Information Details Patient Name: Date of Service: Leanne Lovely [facility] M. 03/27/2023 12:45 PM Medical Record Number: [id number] Patient Account Number: [id number] Date of Birth/Sex: Treating RN: 1960-10-08 (62 y.o. Laymond Purser Primary Care Willia Lampert: [name] Other Clinician: Betha Loa Referring Goldie Dimmer: Treating Amberle Lyter/Extender: [name] in Treatment: 3 Visit Information History Since Last Visit All ordered tests and consults were completed: No Patient Arrived: Ambulatory Added or deleted any medications: No Arrival Time: 12:57 Any new allergies or adverse reactions: No Transfer Assistance: None Had a fall or experienced change in No Patient Identification Verified: Yes activities of daily living that may affect Secondary Verification Process Completed: Yes risk of falls: Patient Requires Transmission-Based Precautions: No Signs or symptoms of abuse/neglect since No Patient Has Alerts: Yes last visito Patient Alerts: Diabetic Type 2 Hospitalized since last visit: No Implantable device outside of the clinic No excluding cellular tissue based products placed in the center since last visit: Has Dressing in Place as Prescribed: Yes Has Footwear/Offloading in Place as Yes Prescribed: Right: Surgical Shoe with Pressure Relief Insole Pain Present Now: No Electronic Signature(s) Signed: 03/27/2023 4:51:16 PM By: Betha Loa Entered By: Betha Loa on 03/27/2023 09:57:38 -------------------------------------------------------------------------------- Clinic Level of Care Assessment Details Patient Name: Date of Service: [name] 03/27/2023 12:45 PM Medical Record Number: [id number] Patient Account Number: [id number] Date of Birth/Sex: Treating RN: [birth year] (62 y.o. Laymond Purser Primary Care Kasiah Manka: [name] Other Clinician: Betha Loa Referring Adonis Ryther: Treating Julez Huseby/Extender: [name] in Treatment: 3 Clinic Level of Care Assessment Items TOOL 1 Quantity Score NICKALOS, SCHOOLER H ([id number]) [id number] Page 2 of 9 []  - 0 Use when EandM and Procedure is performed on INITIAL visit ASSESSMENTS - Nursing Assessment / Reassessment []  - 0 General Physical Exam (combine w/ comprehensive assessment (listed just below) when performed on new pt. evals) []  - 0 Comprehensive Assessment (HX, ROS, Risk Assessments, Wounds Hx, etc.) ASSESSMENTS - Wound and Skin Assessment / Reassessment []  - 0 Dermatologic / Skin Assessment (not related to wound area) ASSESSMENTS - Ostomy and/or Continence Assessment and Care []  - 0 Incontinence Assessment and Management []  - 0 Ostomy Care Assessment and Management (repouching, etc.) PROCESS - Coordination of Care []  - 0 Simple Patient / Family Education for ongoing care []  - 0 Complex (extensive) Patient / Family Education for ongoing care []  - 0 Staff obtains Chiropractor, Records, T Results / Process Orders est []  - 0 Staff telephones HHA, Nursing Homes / Clarify orders / etc []  - 0 Routine Transfer to another Facility (non-emergent condition) []  - 0 Routine Hospital Admission (non-emergent condition) []  - 0 New Admissions / Manufacturing engineer / Ordering NPWT Apligraf, etc. , []  - 0 Emergency Hospital Admission (emergent condition) PROCESS - Special Needs []  - 0 Pediatric / Minor Patient Management []  - 0 Isolation Patient Management []  - 0 Hearing / Language / Visual special needs []  - 0 Assessment of Community assistance (transportation, D/C planning, etc.) []  - 0 Additional assistance / Altered mentation []  - 0 Support Surface(s) Assessment (bed, cushion, seat, etc.) INTERVENTIONS - Miscellaneous []  - 0 External ear  exam []  - 0 Patient Transfer (multiple staff / Nurse, adult / Similar devices) []  - 0 Simple Staple / Suture removal (25 or less) []  - 0 Complex Staple / Suture removal (26 or more) []  - 0 Hypo/Hyperglycemic Management (do not check if  billed separately) []  - 0 Ankle / Brachial Index (ABI) - do not check if billed separately Has the patient been seen at the hospital within the last three years: Yes Total Score: 0 Level Of Care: ____ Electronic Signature(s) Signed: 03/27/2023 4:51:16 PM By: Betha Loa Entered By: Betha Loa on 03/27/2023 10:34:18 Encounter Discharge Information Details -------------------------------------------------------------------------------- [name] ([id number]) [id number] Page 3 of 9 Patient Name: Date of Service: [name] 03/27/2023 12:45 PM Medical Record Number: [id number] Patient Account Number: [id number] Date of Birth/Sex: Treating RN: [birth year] (62 y.o. Laymond Purser Primary Care Marthella Osorno: [name] Other Clinician: Betha Loa Referring Envy Meno: Treating Ekta Dancer/Extender: [name] in Treatment: 3 Encounter Discharge Information Items Post Procedure Vitals Discharge Condition: Stable Temperature (F): 97.7 Ambulatory Status: Ambulatory Pulse (bpm): 62 Discharge Destination: Home Respiratory Rate (breaths/min): 18 Transportation: Private Auto Blood Pressure (mmHg): 136/75 Accompanied By: self Schedule Follow-up Appointment: Yes Clinical Summary of Care: Electronic Signature(s) Signed: 03/27/2023 4:51:16 PM By: Betha Loa Entered By: Betha Loa on 03/27/2023 11:00:47 -------------------------------------------------------------------------------- Lower Extremity Assessment Details Patient Name: Date of Service: [name] 03/27/2023 12:45 PM Medical Record Number: [id number] Patient Account Number: [id number] Date of  Birth/Sex: Treating RN: 1960-09-10 (62 y.o. Laymond Purser Primary Care Rheya Minogue: [name] Other Clinician: Betha Loa Referring Marieme Mcmackin: Treating Edy Belt/Extender: [name] in Treatment: 3 Edema Assessment Assessed: [Left: No] [Right: Yes] Edema: [Left: Ye] [Right: s] Calf Left: Right: Point of Measurement: 36 cm From Medial Instep 34.5 cm Ankle Left: Right: Point of Measurement: 13 cm From Medial Instep 23 cm Vascular Assessment Pulses: Dorsalis Pedis Palpable: [Right:Yes] Toe Nail Assessment Left: Right: Thick: No Discolored: No Deformed: No Improper Length and Hygiene: No Electronic Signature(s) Signed: 03/27/2023 3:43:07 PM By: Angelina Pih Signed: 03/27/2023 4:51:16 PM By: Keturah Shavers ([id number]) [email or web address] [email or web address].pdf Page 4 of 9 Signed: 03/27/2023 4:51:16 PM Entered By: Betha Loa on 03/27/2023 10:06:16 -------------------------------------------------------------------------------- Multi Wound Chart Details Patient Name: Date of Service: Leanne Lovely [facility] M. 03/27/2023 12:45 PM Medical Record Number: [id number] Patient Account Number: [id number] Date of Birth/Sex: Treating RN: 1961-03-28 (62 y.o. Laymond Purser Primary Care Tiyonna Sardinha: [name] Other Clinician: Betha Loa Referring Kavonte Bearse: Treating Mamye Bolds/Extender: [name] in Treatment: 3 Vital Signs Height(in): 68 Pulse(bpm): 62 Weight(lbs): 209 Blood Pressure(mmHg): 136/75 Body Mass Index(BMI): 31.8 Temperature(F): 97.7 Respiratory Rate(breaths/min): 18 [2:Photos:] [N/A:N/A] Right, Plantar Foot N/A N/A Wound Location: Pressure Injury N/A N/A Wounding Event: Diabetic Wound/Ulcer of the Lower N/A N/A Primary Etiology: Extremity Type II Diabetes N/A N/A Comorbid History: 01/02/2023 N/A N/A Date Acquired: 3 N/A N/A Weeks of  Treatment: Open N/A N/A Wound Status: No N/A N/A Wound Recurrence: 0.3x0.2x0.2 N/A N/A Measurements L x W x D (cm) 0.047 N/A N/A A (cm) : rea 0.009 N/A N/A Volume (cm) : -51.60% N/A N/A % Reduction in A rea: -50.00% N/A N/A % Reduction in Volume: Grade 1 N/A N/A Classification: Medium N/A N/A Exudate A mount: Serosanguineous N/A N/A Exudate Type: red, brown N/A N/A Exudate Color: None Present (0%) N/A N/A Granulation A mount: None Present (0%) N/A N/A Necrotic A mount: Fascia: No N/A N/A Exposed Structures: Fat Layer (Subcutaneous Tissue): No Tendon: No Muscle: No Joint: No Bone: No Small (1-33%) N/A N/A Epithelialization: Treatment Notes Electronic Signature(s) Signed: 03/27/2023 4:51:16 PM By: Betha Loa Entered By: Betha Loa on 03/27/2023 10:06:20 [name] ([id number]) [id number] Page 5 of 9 --------------------------------------------------------------------------------  Multi-Disciplinary Care Plan Details Patient Name: Date of Service: Leanne Lovely [facility] M. 03/27/2023 12:45 PM Medical Record Number: [id number] Patient Account Number: [id number] Date of Birth/Sex: Treating RN: [birth year] (62 y.o. Laymond Purser Primary Care Dequante Tremaine: [name] Other Clinician: Betha Loa Referring Sharni Negron: Treating Jaekwon Mcclune/Extender: [name] in Treatment: 3 Active Inactive Necrotic Tissue Nursing Diagnoses: Impaired tissue integrity related to necrotic/devitalized tissue Knowledge deficit related to management of necrotic/devitalized tissue Goals: Necrotic/devitalized tissue will be minimized in the wound bed Date Initiated: 03/04/2023 Target Resolution Date: 04/10/2023 Goal Status: Active Patient/caregiver will verbalize understanding of reason and process for debridement of necrotic tissue Date Initiated: 03/04/2023 Target Resolution Date: 04/10/2023 Goal Status:  Active Interventions: Assess patient pain level pre-, during and post procedure and prior to discharge Provide education on necrotic tissue and debridement process Treatment Activities: Excisional debridement : 03/04/2023 Notes: Pressure Nursing Diagnoses: Knowledge deficit related to causes and risk factors for pressure ulcer development Knowledge deficit related to management of pressures ulcers Potential for impaired tissue integrity related to pressure, friction, moisture, and shear Goals: Patient will remain free from development of additional pressure ulcers Date Initiated: 03/04/2023 Target Resolution Date: 04/04/2023 Goal Status: Active Patient will remain free of pressure ulcers Date Initiated: 03/04/2023 Target Resolution Date: 04/04/2023 Goal Status: Active Patient/caregiver will verbalize risk factors for pressure ulcer development Date Initiated: 03/04/2023 Target Resolution Date: 04/04/2023 Goal Status: Active Patient/caregiver will verbalize understanding of pressure ulcer management Date Initiated: 03/04/2023 Target Resolution Date: 04/04/2023 Goal Status: Active Interventions: Assess offloading mechanisms upon admission and as needed Provide education on pressure ulcers Notes: JADIER, CHAUSSE ([id number]) [id number] Page 6 of 9 Wound/Skin Impairment Nursing Diagnoses: Impaired tissue integrity Knowledge deficit related to ulceration/compromised skin integrity Goals: Patient/caregiver will verbalize understanding of skin care regimen Date Initiated: 03/04/2023 Target Resolution Date: 04/04/2023 Goal Status: Active Ulcer/skin breakdown will have a volume reduction of 30% by week 4 Date Initiated: 03/04/2023 Target Resolution Date: 04/04/2023 Goal Status: Active Ulcer/skin breakdown will have a volume reduction of 50% by week 8 Date Initiated: 03/04/2023 Target Resolution Date: 05/04/2023 Goal Status: Active Ulcer/skin breakdown will  have a volume reduction of 80% by week 12 Date Initiated: 03/04/2023 Target Resolution Date: 06/04/2023 Goal Status: Active Ulcer/skin breakdown will heal within 14 weeks Date Initiated: 03/04/2023 Target Resolution Date: 06/18/2023 Goal Status: Active Interventions: Assess patient/caregiver ability to obtain necessary supplies Assess patient/caregiver ability to perform ulcer/skin care regimen upon admission and as needed Assess ulceration(s) every visit Provide education on ulcer and skin care Treatment Activities: Referred to DME Angella Montas for dressing supplies : 03/04/2023 Skin care regimen initiated : 03/04/2023 Notes: Electronic Signature(s) Signed: 03/27/2023 3:43:07 PM By: Angelina Pih Signed: 03/27/2023 4:51:16 PM By: Betha Loa Entered By: Betha Loa on 03/27/2023 10:34:30 -------------------------------------------------------------------------------- Pain Assessment Details Patient Name: Date of Service: [name] 03/27/2023 12:45 PM Medical Record Number: [id number] Patient Account Number: [id number] Date of Birth/Sex: Treating RN: 28-Mar-1961 (62 y.o. Laymond Purser Primary Care Itzel Mckibbin: [name] Other Clinician: Betha Loa Referring Krissie Merrick: Treating Brian Kocourek/Extender: [name] in Treatment: 3 Active Problems Location of Pain Severity and Description of Pain Patient Has Paino No Site Locations ELYON, YOUNGKIN [place] ([id number]) [id number] Page 7 of 9 Pain Management and Medication Current Pain Management: Electronic Signature(s) Signed: 03/27/2023 3:43:07 PM By: Angelina Pih Signed: 03/27/2023 4:51:16 PM By: Betha Loa Entered By: Betha Loa on 03/27/2023 09:59:34 -------------------------------------------------------------------------------- Patient/Caregiver Education Details Patient Name: Date of Service: Leanne Lovely HN  M. 9/12/2024andnbsp12:45  PM Medical Record Number: [id number] Patient Account Number: [id number] Date of Birth/Gender: Treating RN: Nov 13, 1960 (62 y.o. Laymond Purser Primary Care Physician: [name] Other Clinician: Betha Loa Referring Physician: Treating Physician/Extender: [name] in Treatment: 3 Education Assessment Education Provided To: Patient Education Topics Provided Wound/Skin Impairment: Handouts: Other: continue wound care as directed Methods: Explain/Verbal Responses: State content correctly Electronic Signature(s) Signed: 03/27/2023 4:51:16 PM By: Betha Loa Entered By: Betha Loa on 03/27/2023 10:34:50 [name] ([id number]) [id number] Page 8 of 9 -------------------------------------------------------------------------------- Wound Assessment Details Patient Name: Date of Service: [name] 03/27/2023 12:45 PM Medical Record Number: [id number] Patient Account Number: [id number] Date of Birth/Sex: Treating RN: [birth year] (62 y.o. Laymond Purser Primary Care Arslan Kier: [name] Other Clinician: Betha Loa Referring Kamarius Buckbee: Treating Sherida Dobkins/Extender: [name] in Treatment: 3 Wound Status Wound Number: 2 Primary Etiology: Diabetic Wound/Ulcer of the Lower Extremity Wound Location: Right, Plantar Foot Wound Status: Open Wounding Event: Pressure Injury Comorbid History: Type II Diabetes Date Acquired: 01/02/2023 Weeks Of Treatment: 3 Clustered Wound: No Photos Wound Measurements Length: (cm) 0.3 Width: (cm) 0.2 Depth: (cm) 0.2 Area: (cm) 0.047 Volume: (cm) 0.009 % Reduction in Area: -51.6% % Reduction in Volume: -50% Epithelialization: Small (1-33%) Wound Description Classification: Grade 1 Exudate Amount: Medium Exudate Type: Serosanguineous Exudate Color: red, brown Foul Odor After Cleansing: No Slough/Fibrino  No Wound Bed Granulation Amount: None Present (0%) Exposed Structure Necrotic Amount: None Present (0%) Fascia Exposed: No Fat Layer (Subcutaneous Tissue) Exposed: No Tendon Exposed: No Muscle Exposed: No Joint Exposed: No Bone Exposed: No Treatment Notes Wound #2 (Foot) Wound Laterality: Plantar, Right Cleanser Soap and Water Discharge Instruction: Gently cleanse wound with antibacterial soap, rinse and pat dry prior to dressing wounds Peri-Wound Care LENIEL, FRIEDBERG ([id number]) [id number] Page 9 of 9 Topical Primary Dressing Hydrofera Blue Ready Transfer Foam, 2.5x2.5 (in/in) Discharge Instruction: Apply Hydrofera Blue Ready to wound bed as directed Secondary Dressing (BORDER) Zetuvit Plus SILICONE BORDER Dressing 4x4 (in/in) Discharge Instruction: Please do not put silicone bordered dressings under wraps. Use non-bordered dressing only. Secured With Compression Wrap Compression Stockings Facilities manager) Signed: 03/27/2023 3:43:07 PM By: Angelina Pih Signed: 03/27/2023 4:51:16 PM By: Betha Loa Entered By: Betha Loa on 03/27/2023 10:05:10 -------------------------------------------------------------------------------- Vitals Details Patient Name: Date of Service: [place] Breeding, JO HN M. 03/27/2023 12:45 PM Medical Record Number: [id number] Patient Account Number: [id number] Date of Birth/Sex: Treating RN: 10/25/1960 (62 y.o. Laymond Purser Primary Care Clayson Riling: [name] Other Clinician: Betha Loa Referring Lakindra Wible: Treating Asael Pann/Extender: [name] in Treatment: 3 Vital Signs Time Taken: 12:57 Temperature (F): 97.7 Height (in): 68 Pulse (bpm): 62 Weight (lbs): 209 Respiratory Rate (breaths/min): 18 Body Mass Index (BMI): 31.8 Blood Pressure (mmHg): 136/75 Reference Range: 80 - 120 mg / dl Electronic Signature(s) Signed: 03/27/2023 4:51:16 PM By:  Betha Loa Entered By: Betha Loa on 03/27/2023 09:59:31

## 2023-03-27 NOTE — Progress Notes (Addendum)
ANAND, CHARITY [name] ([id number]) [id number] Page 1 of 9 Visit Report for 03/27/2023 Chief Complaint Document Details Patient Name: Date of Service: [name] [facility] M. 03/27/2023 12:45 PM Medical Record Number: [id number] Patient Account Number: [id number] Date of Birth/Sex: Treating RN: 28-Jul-1960 (62 y.o. [name] Primary Care Provider: [name] Other Clinician: Betha Loa Referring Provider: Treating Provider/Extender: [name] in Treatment: 3 Information Obtained from: Patient Chief Complaint Right plantar foot ulcer Electronic Signature(s) Signed: 03/27/2023 12:56:50 PM By: Allen Derry PA-C Entered By: Allen Derry on 03/27/2023 09:56:50 -------------------------------------------------------------------------------- Debridement Details Patient Name: Date of Service: Harold Lovely HN M. 03/27/2023 12:45 PM Medical Record Number: [id number] Patient Account Number: [id number] Date of Birth/Sex: Treating RN: 02/24/1961 (62 y.o. [name] Primary Care Provider: [name] Other Clinician: Betha Loa Referring Provider: Treating Provider/Extender: [name] in Treatment: 3 Debridement Performed for Assessment: Wound #2 Right,Plantar Foot Performed By: Physician Allen Derry, PA-C Debridement Type: Debridement Severity of Tissue Pre Debridement: Fat layer exposed Level of Consciousness (Pre-procedure): Awake and Alert Pre-procedure Verification/Time Out Yes - 13:32 Taken: Start Time: 13:32 Percent of Wound Bed Debrided: 100% T Area Debrided (cm): otal 0.05 Tissue and other material debrided: Viable, Non-Viable, Callus, Slough, Slough Level: Non-Viable Tissue Debridement Description: Selective/Open Wound Instrument: Curette Bleeding: Minimum Hemostasis Achieved: Pressure Response to Treatment: Procedure was tolerated well Level of  Consciousness (Post- Awake and Alert procedure): SHAYQUAN, NEWSTROM ([id number]) [id number] Page 2 of 9 Post Debridement Measurements of Total Wound Length: (cm) 0.3 Width: (cm) 0.2 Depth: (cm) 0.2 Volume: (cm) 0.801 Character of Wound/Ulcer Post Debridement: Improved Severity of Tissue Post Debridement: Fat layer exposed Post Procedure Diagnosis Same as Pre-procedure Electronic Signature(s) Signed: 03/27/2023 3:43:07 PM By: Angelina Pih Signed: 03/27/2023 4:31:22 PM By: Allen Derry PA-C Signed: 03/27/2023 4:51:16 PM By: Betha Loa Entered By: Betha Loa on 03/27/2023 10:33:51 -------------------------------------------------------------------------------- HPI Details Patient Name: Date of Service: [name], [name] 03/27/2023 12:45 PM Medical Record Number: [id number] Patient Account Number: [id number] Date of Birth/Sex: Treating RN: 10/03/1960 (62 y.o. [name] Primary Care Provider: [name] Other Clinician: Betha Loa Referring Provider: Treating Provider/Extender: [name] in Treatment: 3 History of Present Illness Chronic/Inactive Conditions Condition 1: 03-04-2023 patient's arterial screening today actually appears to show some signs of being somewhat low at 0.83 on the left 1.53 on the right which is essentially noncompressible. I really do not think we can assure ourselves of these readings and I think he really should see vascular for a thorough arterial evaluation in order to ensure that he has good blood flow although I suspect he probably does. HPI Description: 07/20/17 on evaluation today patient actually appears to be doing somewhat poorly in regard to his right plantar great toe ulcer. This is something that he states started as what he thought to be a blister May 04, 2018. Subsequently he treated this on his own for a while thinking that it would just get better. As  it did not get better he eventually did go to have this evaluated where it was treated appropriately with it sounds like Bactroban ointment however the patient states that it never completely resolved. Subsequently if this continue to get worse and was looking more irritated and inflamed he did go to the hospital more recently where he was prescribed Keflex. Subsequently he was also referred to the wound care center here for further evaluation by [place]. He had an x-ray in the  ER which revealed no evidence of osteomyelitis that I explained to the patient that osteomyelitis can be present even in a negative x- ray as it's not nearly as sensitive as an MRI would be. No fevers, chills, nausea, or vomiting noted at this time. The patient tells me that this might look a little bit better since he started the antibiotics. He does have a history of diabetes mellitus type II he also had a horse accident years ago and had to have significant surgery in regard to his right lower extremity that did leave some deformity in this regard. This is mainly centered in the shin region. 07/27/18 upon evaluation today patient appears to be doing fairly well in regard to his toe ulcer all things considered. 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 This obviously is good news. With that being said I do think that he did have some infection which obviously calls some of the necrosis although the wound seems to be doing better he is definitely tolerating the Cipro which I switched into based on the bone culture and seems to be dramatically improving in this regard. Overall I'm pleased with how things stand. 08/03/18 on evaluation today patient appears to be doing rather well in regard to his toe ulcer all things considering. Again at this point the patient's wound shows signs of improvement  although this is slow it still seems to be doing very well in my pinion. Fortunately there does not seem to show any signs of infection as far as the overall appearance of the wound bed is concerned at least nothing worsening. Overall the redness and erythema has dramatically improved. I'm very pleased. Patient does have his infectious disease consult on February 3 and his MRI is actually scheduled for the 23rd of this month which is just three days away. 08/10/18 on evaluation today patient actually appears to be doing okay in regard to his toe ulcer. It does not appear to be as inflamed as it was previous. 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 He still has bone exposed in the base of the wound. I am concerned about how well this is actually going to heal or not at this point. Overall I do believe that the Cipro has been of benefit for him and I will continue this one more week until he sees infectious disease next Monday. I'm also gonna see about however getting appointment for him to see podiatry to discuss his options. 08/18/18 on evaluation today patient's wound actually appears to be doing decently well. He does have bone noted at this point in the base of the wound still although there does not appear to be as much necrotic bone as there was previous. He has been tolerating the Cipro without any complications. He did subsequently have an appointment with infectious disease which was supposed to be yesterday. However they called him when he was getting the car to leave to go in order to tell him that they would not be able to see him due to the doctor being out and rescheduled him for the 24th which is three weeks  away. T be [name], [name] ([id number])  [id number] Page 3 of 9 honest I'm not very pleased with that scenario. Nonetheless the patient tells me as well today that he is not even sure that he would want to go see a podiatrist at this point he really wants to do whatever he can to avoid surgery. 08/25/18 on evaluation today patient's wound continues to show signs of necrotic bone in the base of the wound as well is necrotic tissue in general. He does still have your theme of the great toe he's currently on the oral antibiotics while we are still awaiting the referral to infectious disease which is scheduled for the 24th of this month. At this point I did have a longer conversation today with him concerning hyperbaric option therapy and how considering he doesn't want to have any surgery wants to try to manage this medically it could potentially help in getting this to resolve. Fortunately there does not appear to be any signs of infection at this time systemically. No fevers, chills, nausea, or vomiting noted at this time. He does have a history of having had some heart disease with what sounds to be potentially stand I would have to get an updated EKG prior to HBO therapy. Nonetheless he is not a smoker and has no lung conditions. I'm not sure if you set in echo but if not we may have to see about this as well prior to initiating therapy to ensure his ejection fraction is sufficient. I will look further into this as well. 09/01/18 on evaluation today patient's toe actually appears to be doing about the same may be slightly better in regard to the overall quality of the wound bed although he still has bone exposed some of this will still requiring debridement today due to be necrotic on the tip. Fortunately there's no signs of worsening infection. We did get approval for hyperbaric oxygen therapy for the patient although he states that he thought about it and really does not want to do that. He would like to see  infectious disease first at least and he has an appointment with them on Monday. Subsequently following that appointment I'll see him on Tuesday and then we'll have a longer discussion about what we're gonna do. His toe again also showed a pathologic fracture and I think this is also a big part of the issue as well. Again as I explained to the patient today and have previous I think that the hyperbaric oxygen therapy would be of great benefit for him as far as trying to clear out the infection especially in light of the fact that he really doesn't seem to be progressing quite as well as I would like is still has necrotic bone noted in the base of the wound. Obviously IV antibiotics will help as well I have referred him to fix disease unfortunately the beginning of month is appointment got pushed to the end of this month that actually is on Monday. Subsequently we're gonna see were things stand once they see him. Readmission: 03-04-2023 upon evaluation today patient appears to be doing somewhat poorly today in regard to the wound on the right plantar foot. 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 With that being said the patient tells me at this point that he has been treating this  just at home using some Betadine on the area but really nothing else has been done up to this point. This is actually his first office for his care is concerned in a clinic setting. Patient's past medical history actually is very similar to previous when I saw him. He does have a history of diabetes mellitus type 2, peripheral vascular disease, chronic kidney disease stage IV, hypertension, he has not had any formal arterial testing up to this point. Patient is likely going require an x-ray today. 8/27; patient was seen for the first time last week he has a wound over the  metatarsal head on the right foot. 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 Allena Katz of podiatry. 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 He has been using Hydrofera Blue and sit to vet to the wound area. He had an x-ray done that showed faint sclerosis of the head of the second metatarsal which was nonspecific but new from 07/16/2018 which could be posttraumatic secondary to avascular necrosis or chronic infection 03-20-2023 upon evaluation today patient appears to be doing pretty well currently in regard to the plantar aspect of his foot. This is actually showing signs of improvement which is good news. So far I am seeing enough improvement and I am pretty pleased with where we are at although I did tell him that if he does not continue to see ongoing size improvements that we may want to consider a total contact cast. 03-27-2023 upon evaluation today patient appears to be doing well currently in regard to his wound. Has been tolerating the dressing changes without complication. Fortunately I do not see any evidence of worsening or infection overall. I am very pleased with where we stand today. Electronic Signature(s) Signed: 03/27/2023 1:37:30 PM By: Allen Derry PA-C Previous Signature: 03/27/2023 1:26:36 PM Version By: Allen Derry PA-C Previous Signature: 03/27/2023 1:25:12 PM Version By: Allen Derry PA-C Entered By: Allen Derry on 03/27/2023 10:37:30 -------------------------------------------------------------------------------- Physical Exam Details Patient Name: Date of Service: Harold Lovely HN M. 03/27/2023 12:45 PM Medical Record Number: [id number] Patient Account Number: [id number] Date of Birth/Sex: Treating RN: 01-09-1961 (62 y.o. [name] Primary Care Provider: [name] Other Clinician: Betha Loa Referring Provider: Treating Provider/Extender: [name] in  Treatment: 3 Constitutional Well-nourished and well-hydrated in no acute distress. Respiratory normal breathing without difficulty. Psychiatric [name], [name] ([id number]) [id number] Page 4 of 9 this patient is able to make decisions and demonstrates good insight into disease process. Alert and Oriented x 3. pleasant and cooperative. Notes Upon inspection patient's wound bed showed signs of good granulation and epithelization currently. I do believe that he is making good headway towards closure and very pleased I think that he is tolerating the dressing changes without complication. His wound does seem to be better I did perform debridement to clear away some of the necrotic debris today. This was minimal and much smaller than what I do previous. Electronic Signature(s) Signed: 03/27/2023 1:37:57 PM By: Allen Derry PA-C Previous Signature: 03/27/2023 1:27:44 PM Version By: Allen Derry PA-C Previous Signature: 03/27/2023 1:26:05 PM Version By: Allen Derry PA-C Entered By: Allen Derry on 03/27/2023 10:37:57 -------------------------------------------------------------------------------- Physician Orders Details Patient Name: Date of Service: Harold Lovely HN M. 03/27/2023 12:45 PM Medical Record Number: [id number] Patient Account Number: [id number] Date of Birth/Sex: Treating RN: 06/17/1961 (62 y.o. [name] Primary Care Provider: [name] Other  Clinician: Betha Loa Referring Provider: Treating Provider/Extender: [name] in Treatment: 3 Verbal / Phone Orders: Yes Clinician: Angelina Pih Read Back and Verified: Yes Diagnosis Coding ICD-10 Coding Code Description E11.621 Type 2 diabetes mellitus with foot ulcer L97.512 Non-pressure chronic ulcer of other part of right foot with fat layer exposed I73.89 Other specified peripheral vascular diseases N18.4 Chronic kidney disease, stage 4  (severe) I10 Essential (primary) hypertension Follow-up Appointments Return Appointment in 1 week. Bathing/ Shower/ Hygiene May shower; gently cleanse wound with antibacterial soap, rinse and pat dry prior to dressing wounds Anesthetic (Use 'Patient Medications' Section for Anesthetic Order Entry) Lidocaine applied to wound bed Off-Loading Open toe surgical shoe with peg assist. Wound Treatment Wound #2 - Foot Wound Laterality: Plantar, Right Cleanser: Soap and Water 3 x Per Week/30 Days Discharge Instructions: Gently cleanse wound with antibacterial soap, rinse and pat dry prior to dressing wounds Prim Dressing: Hydrofera Blue Ready Transfer Foam, 2.5x2.5 (in/in) (Dispense As Written) 3 x Per Week/30 Days ary Discharge Instructions: Apply Hydrofera Blue Ready to wound bed as directed Secondary Dressing: (BORDER) Zetuvit Plus SILICONE BORDER Dressing 4x4 (in/in) (Generic) 3 x Per Week/30 Days Discharge Instructions: Please do not put silicone bordered dressings under wraps. Use non-bordered dressing only. Electronic Signature(s) Signed: 03/27/2023 4:31:22 PM By: Allen Derry PA-C Signed: 03/27/2023 4:51:16 PM By: Keturah Shavers ([id number]) By: Alla Feeling.pdf Page 5 of 9 Signed: 03/27/2023 4:51:16 PM Entered By: Betha Loa on 03/27/2023 10:34:06 -------------------------------------------------------------------------------- Problem List Details Patient Name: Date of Service: Harold Mould M. 03/27/2023 12:45 PM Medical Record Number: [id number] Patient Account Number: [id number] Date of Birth/Sex: Treating RN: 23-Jun-1961 (62 y.o. [name] Primary Care Provider: [name] Other Clinician: Betha Loa Referring Provider: Treating Provider/Extender: [name] in Treatment: 3 Active Problems ICD-10 Encounter Code Description Active Date MDM Diagnosis E11.621 Type  2 diabetes mellitus with foot ulcer 03/04/2023 No Yes L97.512 Non-pressure chronic ulcer of other part of right foot with fat layer exposed 03/04/2023 No Yes I73.89 Other specified peripheral vascular diseases 03/04/2023 No Yes N18.4 Chronic kidney disease, stage 4 (severe) 03/04/2023 No Yes I10 Essential (primary) hypertension 03/04/2023 No Yes Inactive Problems Resolved Problems Electronic Signature(s) Signed: 03/27/2023 12:56:47 PM By: Allen Derry PA-C Entered By: Allen Derry on 03/27/2023 09:56:47 Progress Note Details -------------------------------------------------------------------------------- [name] ([id number]) [id number] Page 6 of 9 Patient Name: Date of Service: Harold Mote. 03/27/2023 12:45 PM Medical Record Number: [id number] Patient Account Number: [id number] Date of Birth/Sex: Treating RN: 05/02/1961 (62 y.o. [name] Primary Care Provider: [name] Other Clinician: Betha Loa Referring Provider: Treating Provider/Extender: [name] in Treatment: 3 Subjective Chief Complaint Information obtained from Patient Right plantar foot ulcer History of Present Illness (HPI) Chronic/Inactive Condition: 03-04-2023 patient's arterial screening today actually appears to show some signs of being somewhat low at 0.83 on the left 1.53 on the right which is essentially noncompressible. I really do not think we can assure ourselves of these readings and I think he really should see vascular for a thorough arterial evaluation in order to ensure that he has good blood flow although I suspect he probably does. 07/20/17 on evaluation today patient actually appears to be doing somewhat poorly in regard to his right plantar great toe ulcer. This is something that he states started as what he thought to be a blister May 04, 2018. Subsequently he treated this on his own for  a while thinking  that it would just get better. As it did not get better he eventually did go to have this evaluated where it was treated appropriately with it sounds like Bactroban ointment however the patient states that it never completely resolved. Subsequently if this continue to get worse and was looking more irritated and inflamed he did go to the hospital more recently where he was prescribed Keflex. Subsequently he was also referred to the wound care center here for further evaluation by [place]. He had an x-ray in the ER which revealed no evidence of osteomyelitis that I explained to the patient that osteomyelitis can be present even in a negative x-ray as it's not nearly as sensitive as an MRI would be. No fevers, chills, nausea, or vomiting noted at this time. The patient tells me that this might look a little bit better since he started the antibiotics. He does have a history of diabetes mellitus type II he also had a horse accident years ago and had to have significant surgery in regard to his right lower extremity that did leave some deformity in this regard. This is mainly centered in the shin region. 07/27/18 upon evaluation today patient appears to be doing fairly well in regard to his toe ulcer all things considered. 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 This obviously is good news. With that being said I do think that he did have some infection which obviously calls some of the necrosis although the wound seems to be doing better he is definitely tolerating the Cipro which I switched into based on the bone culture and seems to be dramatically improving in this regard. Overall I'm pleased with how things stand. 08/03/18 on evaluation today patient appears to be doing rather well in regard to his toe ulcer all things considering. Again at this point the patient's  wound shows signs of improvement although this is slow it still seems to be doing very well in my pinion. Fortunately there does not seem to show any signs of infection as far as the overall appearance of the wound bed is concerned at least nothing worsening. Overall the redness and erythema has dramatically improved. I'm very pleased. Patient does have his infectious disease consult on February 3 and his MRI is actually scheduled for the 23rd of this month which is just three days away. 08/10/18 on evaluation today patient actually appears to be doing okay in regard to his toe ulcer. It does not appear to be as inflamed as it was previous. 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 He still has bone exposed in the base of the wound. I am concerned about how well this is actually going to heal or not at this point. Overall I do believe that the Cipro has been of benefit for him and I will continue this one more week until he sees infectious disease next Monday. I'm also gonna see about however getting appointment for him to see podiatry to discuss his options. 08/18/18 on evaluation today patient's wound actually appears to be doing decently well. He does have bone noted at  this point in the base of the wound still although there does not appear to be as much necrotic bone as there was previous. He has been tolerating the Cipro without any complications. He did subsequently have an appointment with infectious disease which was supposed to be yesterday. However they called him when he was getting the car to leave to go in order to tell him that they would not be able to see him due to the doctor being out and rescheduled him for the 24th which is three weeks away. T  be o honest I'm not very pleased with that scenario. Nonetheless the patient tells me as well today that he is not even sure that he would want to go see a podiatrist at this point he really wants to do whatever he can to avoid surgery. 08/25/18 on evaluation today patient's wound continues to show signs of necrotic bone in the base of the wound as well is necrotic tissue in general. He does still have your theme of the great toe he's currently on the oral antibiotics while we are still awaiting the referral to infectious disease which is scheduled for the 24th of this month. At this point I did have a longer conversation today with him concerning hyperbaric option therapy and how considering he doesn't want to have any surgery wants to try to manage this medically it could potentially help in getting this to resolve. Fortunately there does not appear to be any signs of infection at this time systemically. No fevers, chills, nausea, or vomiting noted at this time. He does have a history of having had some heart disease with what sounds to be potentially stand I would have to get an updated EKG prior to HBO therapy. Nonetheless he is not a smoker and has no lung conditions. I'm not sure if you set in echo but if not we may have to see about this as well prior to initiating therapy to ensure his ejection fraction is sufficient. I will look further into this as well. 09/01/18 on evaluation today patient's toe actually appears to be doing about the same may be slightly better in regard to the overall quality of the wound bed although he still has bone exposed some of this will still requiring debridement today due to be necrotic on the tip. Fortunately there's no signs of worsening infection. We did get approval for hyperbaric oxygen therapy for the patient although he states that he thought about it and really does not want to do that. He would like to see infectious disease first at least and he has an  appointment with them on Monday. Subsequently following that appointment I'll see him on Tuesday and then we'll have a longer discussion about what we're gonna do. His toe again also showed a pathologic fracture and I think this is also a big part of the issue as well. Again as I explained to the patient today and have previous I think that the hyperbaric oxygen therapy would be of great benefit for him as far as trying to clear out the infection especially in light of the fact that he really doesn't seem to be progressing quite as well as I would like is still has necrotic bone noted in the base of the wound. Obviously IV antibiotics will help as well I have referred him to fix disease unfortunately the beginning of month is appointment got pushed to the end of this month that actually is on Monday. Subsequently we're [place]  see were things stand once they see him. Readmission: 03-04-2023 upon evaluation today patient appears to be doing somewhat poorly today in regard to the wound on the right plantar foot. 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 With that being said the patient tells me at this point that he has been treating this just at home using some Betadine on the area but really nothing else has been done up to this point. This is actually his first office for his care is concerned in a clinic setting. Patient's past medical history actually is very similar to previous when I saw him. He does have a history of diabetes mellitus type 2, peripheral vascular disease, chronic kidney disease stage IV, hypertension, he has not had any formal arterial testing up to this point. Patient is likely going require an x-ray today. 8/27; patient was seen for the first time last week he has a wound over the metatarsal head on the right foot. 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 Allena Katz of podiatry. 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 He has been using Hydrofera Blue and sit to vet to the wound area. [name], [name] ([id number]) [id number] Page 7 of 9 He had an x-ray done that showed faint sclerosis of the head of the second metatarsal which was nonspecific but new from 07/16/2018 which could be posttraumatic secondary to avascular necrosis or chronic infection 03-20-2023 upon evaluation today patient appears to be doing pretty well currently in regard to the plantar aspect of his foot. This is actually showing signs of improvement which is good news. So far I am seeing enough improvement and I am pretty pleased with where we are at although I did tell him that if he does not continue to see ongoing size improvements that we may want to consider a total contact cast. 03-27-2023 upon evaluation today patient appears to be doing well currently in regard to his wound. Has been tolerating the dressing changes without complication. Fortunately I do not see any evidence of worsening or infection overall. I am very pleased with where we stand today. Objective Constitutional Well-nourished and well-hydrated in no acute distress. Vitals Time Taken: 12:57 PM, Height: 68 in, Weight: 209 lbs, BMI: 31.8, Temperature: 97.7 F, Pulse: 62 bpm, Respiratory Rate: 18 breaths/min, Blood Pressure: 136/75 mmHg. Respiratory normal breathing without difficulty. Psychiatric this patient is able to make decisions and demonstrates good insight into disease process. Alert and Oriented x 3. pleasant and cooperative. General Notes: Upon inspection patient's wound bed showed signs of good granulation and epithelization currently. I do believe that he is making good headway towards closure and very pleased I think that he is tolerating the dressing changes without complication. His  wound does seem to be better I did perform debridement to clear away some of the necrotic debris today. This was minimal and much smaller than what I do previous. Integumentary (Hair, Skin) Wound #2 status is Open. Original cause of wound was Pressure Injury. The date acquired was: 01/02/2023. The wound has been in treatment 3 weeks. The wound is located on the Right,Plantar Foot. The wound measures 0.3cm length x 0.2cm width x 0.2cm depth; 0.047cm^2 area and 0.009cm^3 volume. There is a medium amount of serosanguineous drainage noted.  There is no granulation within the wound bed. There is no necrotic tissue within the wound bed. Assessment Active Problems ICD-10 Type 2 diabetes mellitus with foot ulcer Non-pressure chronic ulcer of other part of right foot with fat layer exposed Other specified peripheral vascular diseases Chronic kidney disease, stage 4 (severe) Essential (primary) hypertension Procedures Wound #2 Pre-procedure diagnosis of Wound #2 is a Diabetic Wound/Ulcer of the Lower Extremity located on the Right,Plantar Foot .Severity of Tissue Pre Debridement is: Fat layer exposed. There was a Selective/Open Wound Non-Viable Tissue Debridement with a total area of 0.05 sq cm performed by Allen Derry, PA-C. With the following instrument(s): Curette to remove Viable and Non-Viable tissue/material. Material removed includes Callus and Slough and. A time out was conducted at 13:32, prior to the start of the procedure. A Minimum amount of bleeding was controlled with Pressure. The procedure was tolerated well. Post Debridement Measurements: 0.3cm length x 0.2cm width x 0.2cm depth; 0.801cm^3 volume. Character of Wound/Ulcer Post Debridement is improved. Severity of Tissue Post Debridement is: Fat layer exposed. Post procedure Diagnosis Wound #2: Same as Pre-Procedure Plan Follow-up Appointments: Return Appointment in 1 week. Bathing/ Shower/ Hygiene: May shower; gently cleanse wound  with antibacterial soap, rinse and pat dry prior to dressing wounds Anesthetic (Use 'Patient Medications' Section for Anesthetic Order Entry): Lidocaine applied to wound bed [name], [name] ([id number]) [id number] Page 8 of 9 Off-Loading: Open toe surgical shoe with peg assist. WOUND #2: - Foot Wound Laterality: Plantar, Right Cleanser: Soap and Water 3 x Per Week/30 Days Discharge Instructions: Gently cleanse wound with antibacterial soap, rinse and pat dry prior to dressing wounds Prim Dressing: Hydrofera Blue Ready Transfer Foam, 2.5x2.5 (in/in) (Dispense As Written) 3 x Per Week/30 Days ary Discharge Instructions: Apply Hydrofera Blue Ready to wound bed as directed Secondary Dressing: (BORDER) Zetuvit Plus SILICONE BORDER Dressing 4x4 (in/in) (Generic) 3 x Per Week/30 Days Discharge Instructions: Please do not put silicone bordered dressings under wraps. Use non-bordered dressing only. 1. I would recommend that we have the patient continue to monitor for any signs of infection or worsening. Overall I do believe that we are making good headway towards complete closure. 2. I am also can recommend patient should continue with the [facility] dressing which I think is doing quite well. 3. I am also can recommend that the patient should continue to offload is much as he can using the bordered foam dressing and the peg assist offloading shoe. He also should limit his walking is much as possible. We will see patient back for reevaluation in 1 week here in the clinic. If anything worsens or changes patient will contact our office for additional recommendations. Electronic Signature(s) Signed: 03/27/2023 1:38:24 PM By: Allen Derry PA-C Entered By: Allen Derry on 03/27/2023 10:38:24 -------------------------------------------------------------------------------- SuperBill Details Patient Name: Date of Service: [name], [name] 03/27/2023 Medical Record  Number: [id number] Patient Account Number: [id number] Date of Birth/Sex: Treating RN: [birth year] (62 y.o. [name] Primary Care Provider: [name] Other Clinician: Betha Loa Referring Provider: Treating Provider/Extender: [name] in Treatment: 3 Diagnosis Coding ICD-10 Codes Code Description [phone] Type 2 diabetes mellitus with foot ulcer L97.512 Non-pressure chronic ulcer of other part of right foot with fat layer exposed I73.89 Other specified peripheral vascular diseases N18.4 Chronic kidney disease, stage 4 (severe) I10 Essential (primary) hypertension Facility Procedures : CPT4 Code: [id number] Description: 97597 - DEBRIDE WOUND 1ST 20 SQ CM OR < ICD-10 Diagnosis Description L97.512 Non-pressure chronic  ulcer of other part of right foot with fat layer exposed Modifier: Quantity: 1 Physician Procedures : CPT4 Code Description Modifier [id number] - WC PHYS DEBR WO ANESTH 20 SQ CM ICD-10 Diagnosis Description L97.512 Non-pressure chronic ulcer of other part of right foot with fat layer exposed Quantity: 1 Electronic Signature(s) [name], [name] ([id number]) [id number] Page 9 of 9 Signed: 03/27/2023 1:38:31 PM By: Allen Derry PA-C Entered By: Allen Derry on 03/27/2023 10:38:30

## 2023-04-03 ENCOUNTER — Encounter: Payer: Medicaid Other | Admitting: Physician Assistant

## 2023-04-03 DIAGNOSIS — E1122 Type 2 diabetes mellitus with diabetic chronic kidney disease: Secondary | ICD-10-CM | POA: Diagnosis not present

## 2023-04-03 DIAGNOSIS — L97512 Non-pressure chronic ulcer of other part of right foot with fat layer exposed: Secondary | ICD-10-CM | POA: Diagnosis not present

## 2023-04-03 DIAGNOSIS — E11621 Type 2 diabetes mellitus with foot ulcer: Secondary | ICD-10-CM | POA: Diagnosis not present

## 2023-04-03 DIAGNOSIS — N184 Chronic kidney disease, stage 4 (severe): Secondary | ICD-10-CM | POA: Diagnosis not present

## 2023-04-03 DIAGNOSIS — I129 Hypertensive chronic kidney disease with stage 1 through stage 4 chronic kidney disease, or unspecified chronic kidney disease: Secondary | ICD-10-CM | POA: Diagnosis not present

## 2023-04-03 DIAGNOSIS — E11622 Type 2 diabetes mellitus with other skin ulcer: Secondary | ICD-10-CM | POA: Diagnosis not present

## 2023-04-03 NOTE — Progress Notes (Addendum)
at home using some Betadine on the area but really nothing else has been done up to this point. This is actually his first office for his care is concerned in a clinic setting. Patient's past medical history actually is very similar to previous when I saw him. He does have a history of diabetes mellitus type 2, peripheral vascular disease, chronic kidney disease stage IV, hypertension, he has not had any formal arterial testing up to this point. Patient is likely going require an x-ray today. 8/27; patient was seen for the first time last week he has a wound over the metatarsal head on the right foot.  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 Allena Katz of podiatry. 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 He has been using Hydrofera Blue and sit to vet to the wound area. He had an x-ray done that showed faint sclerosis of the head of the second metatarsal which was nonspecific but new from 07/16/2018 which could be posttraumatic secondary to avascular necrosis or chronic infection 03-20-2023 upon evaluation today patient appears to be doing pretty well currently in regard to the plantar aspect of his foot. This is actually showing signs of improvement which is good news. So far I am seeing enough improvement and I am pretty pleased with where we are at although I did tell him that if he does not continue to see ongoing size improvements that we may want to consider a total contact cast. 03-27-2023 upon evaluation today patient appears to be doing well currently in regard to his wound. Has been tolerating the dressing changes without complication. Fortunately I do not see any evidence of worsening or infection overall. I am very pleased with where we stand today. 04-03-2023 upon evaluation today patient appears to be doing well currently in regard to his wound on the foot. Fortunately I do not see any signs of active infection locally or systemically at this time. With that being said the wound is getting very tiny and I think he is doing quite well. Electronic Signature(s) Signed: 04/03/2023 3:17:15 PM By: Allen Derry PA-C Entered By: Allen Derry on 04/03/2023 12:17:15 -------------------------------------------------------------------------------- Physical Exam Details Patient Name: Date of Service: Harold Mould M. 04/03/2023 2:30 PM Medical Record Number: [id number] Patient Account Number: [id number] Date of Birth/Sex: Treating RN: 08-18-1960 (62 y.o. [name] Primary Care Provider: [name] Other  Clinician: Referring Provider: Treating Provider/Extender: [name] in Treatment: 4 Constitutional Well-nourished and well-hydrated in no acute distress. Respiratory KATSUMI, PAULOVICH ([id number]) [id number] Page 4 of 9 normal breathing without difficulty. Psychiatric this patient is able to make decisions and demonstrates good insight into disease process. Alert and Oriented x 3. pleasant and cooperative. Notes Upon inspection patient's wound bed actually showed signs of good granulation and epithelization at this point. Fortunately I do not see any signs of worsening overall and I believe that the patient is making headway towards complete closure. This is good news. Electronic Signature(s) Signed: 04/03/2023 3:17:27 PM By: Allen Derry PA-C Entered By: Allen Derry on 04/03/2023 12:17:27 -------------------------------------------------------------------------------- Physician Orders Details Patient Name: Date of Service: Harold Mould M. 04/03/2023 2:30 PM Medical Record Number: [id number] Patient Account Number: [id number] Date of Birth/Sex: Treating RN: 1960/11/17 (62 y.o. [name] Primary Care Provider: [name] Other Clinician: Referring Provider: Treating Provider/Extender: [name] in Treatment: 4 Verbal / Phone Orders:  [name], [name] ([id number]) [id number] Page 1 of 9 Visit Report for 04/03/2023 Chief Complaint Document Details Patient Name: Date of Service: Harold Lovely [facility] M. 04/03/2023 2:30 PM Medical Record Number: [id number] Patient Account Number: [id number] Date of Birth/Sex: Treating RN: 1960-09-04 (62 y.o. [name]) Yevonne Pax Primary Care Provider: [name] Other Clinician: Referring Provider: Treating Provider/Extender: [name] in Treatment: 4 Information Obtained from: Patient Chief Complaint Right plantar foot ulcer Electronic Signature(s) Signed: 04/03/2023 2:37:23 PM By: Allen Derry PA-C Entered By: Allen Derry on 04/03/2023 11:37:23 -------------------------------------------------------------------------------- Debridement Details Patient Name: Date of Service: Harold Lovely HN M. 04/03/2023 2:30 PM Medical Record Number: [id number] Patient Account Number: [id number] Date of Birth/Sex: Treating RN: [birth year] (62 y.o. [name] Primary Care Provider: [name] Other Clinician: Referring Provider: Treating Provider/Extender: [name] in Treatment: 4 Debridement Performed for Assessment: Wound #2 Right,Plantar Foot Performed By: Physician Allen Derry, PA-C Debridement Type: Debridement Severity of Tissue Pre Debridement: Fat layer exposed Level of Consciousness (Pre-procedure): Awake and Alert Pre-procedure Verification/Time Out Yes - 14:50 Taken: Start Time: 14:50 Percent of Wound Bed Debrided: 100% T Area Debrided (cm): otal 0.05 Tissue and other material debrided: Viable, Non-Viable, Callus, Slough, Subcutaneous, Slough Level: Skin/Subcutaneous Tissue Debridement Description: Excisional Instrument: Curette Bleeding: Minimum Hemostasis Achieved: Silver Nitrate End Time: 14:57 Procedural Pain: 0 Post Procedural Pain: 0 [name], [name]  ([id number]) [id number] Page 2 of 9 Response to Treatment: Procedure was tolerated well Level of Consciousness (Post- Awake and Alert procedure): Post Debridement Measurements of Total Wound Length: (cm) 0.3 Width: (cm) 0.2 Depth: (cm) 0.1 Volume: (cm) 0.005 Character of Wound/Ulcer Post Debridement: Improved Severity of Tissue Post Debridement: Fat layer exposed Post Procedure Diagnosis Same as Pre-procedure Electronic Signature(s) Signed: 04/04/2023 9:00:01 AM By: Allen Derry PA-C Signed: 04/07/2023 8:30:37 AM By: Yevonne Pax RN Entered By: Yevonne Pax on 04/03/2023 12:00:03 -------------------------------------------------------------------------------- HPI Details Patient Name: Date of Service: Harold Lovely HN M. 04/03/2023 2:30 PM Medical Record Number: [id number] Patient Account Number: [id number] Date of Birth/Sex: Treating RN: 27-Nov-1960 (62 y.o. [name] Primary Care Provider: [name] Other Clinician: Referring Provider: Treating Provider/Extender: [name] in Treatment: 4 History of Present Illness Chronic/Inactive Conditions Condition 1: 03-04-2023 patient's arterial screening today actually appears to show some signs of being somewhat low at 0.83 on the left 1.53 on the right which is essentially noncompressible. I really do not think we can assure ourselves of these readings and I think he really should see vascular for a thorough arterial evaluation in order to ensure that he has good blood flow although I suspect he probably does. HPI Description: 07/20/17 on evaluation today patient actually appears to be doing somewhat poorly in regard to his right plantar great toe ulcer. This is something that he states started as what he thought to be a blister May 04, 2018. Subsequently he treated this on his own for a while thinking that it would just get better. As it did not get better he  eventually did go to have this evaluated where it was treated appropriately with it sounds like Bactroban ointment however the patient states that it never completely resolved. Subsequently if this continue to get worse and was looking more irritated and inflamed he did go to the hospital more recently where he was prescribed Keflex. Subsequently he was also referred to the wound care center here for further evaluation by [place]. He had an x-ray in the ER  within the wound bed. There is no necrotic tissue within the wound bed. Assessment Active Problems ICD-10 Type 2 diabetes mellitus with foot ulcer Non-pressure chronic ulcer of other part of right foot with fat layer exposed Other specified peripheral vascular diseases Chronic kidney disease, stage 4 (severe) Essential (primary) hypertension Procedures Wound #2 Pre-procedure diagnosis of Wound #2 is a Diabetic Wound/Ulcer of the Lower Extremity located on the Right,Plantar Foot .Severity of Tissue Pre Debridement is: Fat layer exposed. There was a Excisional Skin/Subcutaneous Tissue Debridement with a total area of 0.05 sq cm performed by Allen Derry, PA-C. With the following instrument(s): Curette to remove Viable and Non-Viable tissue/material. Material removed includes Callus, Subcutaneous Tissue, and Slough. No specimens were taken. A time out was conducted at 14:50, prior to the start of the procedure. A Minimum amount of bleeding was controlled with Silver Nitrate. The procedure was tolerated well with a pain level of 0 throughout and a pain level of 0 following the procedure. Post Debridement Measurements: 0.3cm length x 0.2cm width x 0.1cm depth; 0.005cm^3 volume. Character of Wound/Ulcer Post Debridement is improved. Severity of Tissue Post Debridement is: Fat layer exposed. Post procedure Diagnosis Wound #2: Same as Pre-Procedure Plan SHAYDON, ROESCH ([id number]) [id number] Page 8 of 9 Follow-up Appointments: Return Appointment in 1 week. Bathing/ Shower/  Hygiene: May shower; gently cleanse wound with antibacterial soap, rinse and pat dry prior to dressing wounds Anesthetic (Use 'Patient Medications' Section for Anesthetic Order Entry): Lidocaine applied to wound bed Off-Loading: Open toe surgical shoe with peg assist. WOUND #2: - Foot Wound Laterality: Plantar, Right Cleanser: Soap and Water 3 x Per Week/30 Days Discharge Instructions: Gently cleanse wound with antibacterial soap, rinse and pat dry prior to dressing wounds Prim Dressing: Hydrofera Blue Ready Transfer Foam, 2.5x2.5 (in/in) (Dispense As Written) 3 x Per Week/30 Days ary Discharge Instructions: Apply Hydrofera Blue Ready to wound bed as directed Secondary Dressing: (BORDER) Zetuvit Plus SILICONE BORDER Dressing 4x4 (in/in) (Generic) 3 x Per Week/30 Days Discharge Instructions: Please do not put silicone bordered dressings under wraps. Use non-bordered dressing only. 1. I would recommend that we have the patient continue to monitor for any evidence of infection or worsening. Overall based on what I am seeing I do believe that the patient really is doing really well with regard to the dressings. 2. I would recommend that we continue with the [facility] which I think is doing awesome. 3. I am also can recommend that we have the patient continue to monitor for any signs of infection or worsening overall. With regard to healing though I think were doing quite well he is using the offloading shoe. We will see patient back for reevaluation in 1 week here in the clinic. If anything worsens or changes patient will contact our office for additional recommendations. Electronic Signature(s) Signed: 04/03/2023 3:18:02 PM By: Allen Derry PA-C Entered By: Allen Derry on 04/03/2023 12:18:02 -------------------------------------------------------------------------------- SuperBill Details Patient Name: Date of Service: [name], Harold Red M. 04/03/2023 Medical Record Number:  [id number] Patient Account Number: [id number] Date of Birth/Sex: Treating RN: 1960-09-02 (62 y.o. [name]) Yevonne Pax Primary Care Provider: [name] Other Clinician: Referring Provider: Treating Provider/Extender: [name] in Treatment: 4 Diagnosis Coding ICD-10 Codes Code Description [phone] Type 2 diabetes mellitus with foot ulcer L97.512 Non-pressure chronic ulcer of other part of right foot with fat layer exposed I73.89 Other specified peripheral vascular diseases N18.4 Chronic kidney disease, stage 4 (severe) I10 Essential (primary) hypertension Facility Procedures :  at home using some Betadine on the area but really nothing else has been done up to this point. This is actually his first office for his care is concerned in a clinic setting. Patient's past medical history actually is very similar to previous when I saw him. He does have a history of diabetes mellitus type 2, peripheral vascular disease, chronic kidney disease stage IV, hypertension, he has not had any formal arterial testing up to this point. Patient is likely going require an x-ray today. 8/27; patient was seen for the first time last week he has a wound over the metatarsal head on the right foot.  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 Allena Katz of podiatry. 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 He has been using Hydrofera Blue and sit to vet to the wound area. He had an x-ray done that showed faint sclerosis of the head of the second metatarsal which was nonspecific but new from 07/16/2018 which could be posttraumatic secondary to avascular necrosis or chronic infection 03-20-2023 upon evaluation today patient appears to be doing pretty well currently in regard to the plantar aspect of his foot. This is actually showing signs of improvement which is good news. So far I am seeing enough improvement and I am pretty pleased with where we are at although I did tell him that if he does not continue to see ongoing size improvements that we may want to consider a total contact cast. 03-27-2023 upon evaluation today patient appears to be doing well currently in regard to his wound. Has been tolerating the dressing changes without complication. Fortunately I do not see any evidence of worsening or infection overall. I am very pleased with where we stand today. 04-03-2023 upon evaluation today patient appears to be doing well currently in regard to his wound on the foot. Fortunately I do not see any signs of active infection locally or systemically at this time. With that being said the wound is getting very tiny and I think he is doing quite well. Electronic Signature(s) Signed: 04/03/2023 3:17:15 PM By: Allen Derry PA-C Entered By: Allen Derry on 04/03/2023 12:17:15 -------------------------------------------------------------------------------- Physical Exam Details Patient Name: Date of Service: Harold Mould M. 04/03/2023 2:30 PM Medical Record Number: [id number] Patient Account Number: [id number] Date of Birth/Sex: Treating RN: 08-18-1960 (62 y.o. [name] Primary Care Provider: [name] Other  Clinician: Referring Provider: Treating Provider/Extender: [name] in Treatment: 4 Constitutional Well-nourished and well-hydrated in no acute distress. Respiratory KATSUMI, PAULOVICH ([id number]) [id number] Page 4 of 9 normal breathing without difficulty. Psychiatric this patient is able to make decisions and demonstrates good insight into disease process. Alert and Oriented x 3. pleasant and cooperative. Notes Upon inspection patient's wound bed actually showed signs of good granulation and epithelization at this point. Fortunately I do not see any signs of worsening overall and I believe that the patient is making headway towards complete closure. This is good news. Electronic Signature(s) Signed: 04/03/2023 3:17:27 PM By: Allen Derry PA-C Entered By: Allen Derry on 04/03/2023 12:17:27 -------------------------------------------------------------------------------- Physician Orders Details Patient Name: Date of Service: Harold Mould M. 04/03/2023 2:30 PM Medical Record Number: [id number] Patient Account Number: [id number] Date of Birth/Sex: Treating RN: 1960/11/17 (62 y.o. [name] Primary Care Provider: [name] Other Clinician: Referring Provider: Treating Provider/Extender: [name] in Treatment: 4 Verbal / Phone Orders:  at home using some Betadine on the area but really nothing else has been done up to this point. This is actually his first office for his care is concerned in a clinic setting. Patient's past medical history actually is very similar to previous when I saw him. He does have a history of diabetes mellitus type 2, peripheral vascular disease, chronic kidney disease stage IV, hypertension, he has not had any formal arterial testing up to this point. Patient is likely going require an x-ray today. 8/27; patient was seen for the first time last week he has a wound over the metatarsal head on the right foot.  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 Allena Katz of podiatry. 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 He has been using Hydrofera Blue and sit to vet to the wound area. He had an x-ray done that showed faint sclerosis of the head of the second metatarsal which was nonspecific but new from 07/16/2018 which could be posttraumatic secondary to avascular necrosis or chronic infection 03-20-2023 upon evaluation today patient appears to be doing pretty well currently in regard to the plantar aspect of his foot. This is actually showing signs of improvement which is good news. So far I am seeing enough improvement and I am pretty pleased with where we are at although I did tell him that if he does not continue to see ongoing size improvements that we may want to consider a total contact cast. 03-27-2023 upon evaluation today patient appears to be doing well currently in regard to his wound. Has been tolerating the dressing changes without complication. Fortunately I do not see any evidence of worsening or infection overall. I am very pleased with where we stand today. 04-03-2023 upon evaluation today patient appears to be doing well currently in regard to his wound on the foot. Fortunately I do not see any signs of active infection locally or systemically at this time. With that being said the wound is getting very tiny and I think he is doing quite well. Electronic Signature(s) Signed: 04/03/2023 3:17:15 PM By: Allen Derry PA-C Entered By: Allen Derry on 04/03/2023 12:17:15 -------------------------------------------------------------------------------- Physical Exam Details Patient Name: Date of Service: Harold Mould M. 04/03/2023 2:30 PM Medical Record Number: [id number] Patient Account Number: [id number] Date of Birth/Sex: Treating RN: 08-18-1960 (62 y.o. [name] Primary Care Provider: [name] Other  Clinician: Referring Provider: Treating Provider/Extender: [name] in Treatment: 4 Constitutional Well-nourished and well-hydrated in no acute distress. Respiratory KATSUMI, PAULOVICH ([id number]) [id number] Page 4 of 9 normal breathing without difficulty. Psychiatric this patient is able to make decisions and demonstrates good insight into disease process. Alert and Oriented x 3. pleasant and cooperative. Notes Upon inspection patient's wound bed actually showed signs of good granulation and epithelization at this point. Fortunately I do not see any signs of worsening overall and I believe that the patient is making headway towards complete closure. This is good news. Electronic Signature(s) Signed: 04/03/2023 3:17:27 PM By: Allen Derry PA-C Entered By: Allen Derry on 04/03/2023 12:17:27 -------------------------------------------------------------------------------- Physician Orders Details Patient Name: Date of Service: Harold Mould M. 04/03/2023 2:30 PM Medical Record Number: [id number] Patient Account Number: [id number] Date of Birth/Sex: Treating RN: 1960/11/17 (62 y.o. [name] Primary Care Provider: [name] Other Clinician: Referring Provider: Treating Provider/Extender: [name] in Treatment: 4 Verbal / Phone Orders:  [name], [name] ([id number]) [id number] Page 1 of 9 Visit Report for 04/03/2023 Chief Complaint Document Details Patient Name: Date of Service: Harold Lovely [facility] M. 04/03/2023 2:30 PM Medical Record Number: [id number] Patient Account Number: [id number] Date of Birth/Sex: Treating RN: 1960-09-04 (62 y.o. [name]) Yevonne Pax Primary Care Provider: [name] Other Clinician: Referring Provider: Treating Provider/Extender: [name] in Treatment: 4 Information Obtained from: Patient Chief Complaint Right plantar foot ulcer Electronic Signature(s) Signed: 04/03/2023 2:37:23 PM By: Allen Derry PA-C Entered By: Allen Derry on 04/03/2023 11:37:23 -------------------------------------------------------------------------------- Debridement Details Patient Name: Date of Service: Harold Lovely HN M. 04/03/2023 2:30 PM Medical Record Number: [id number] Patient Account Number: [id number] Date of Birth/Sex: Treating RN: [birth year] (62 y.o. [name] Primary Care Provider: [name] Other Clinician: Referring Provider: Treating Provider/Extender: [name] in Treatment: 4 Debridement Performed for Assessment: Wound #2 Right,Plantar Foot Performed By: Physician Allen Derry, PA-C Debridement Type: Debridement Severity of Tissue Pre Debridement: Fat layer exposed Level of Consciousness (Pre-procedure): Awake and Alert Pre-procedure Verification/Time Out Yes - 14:50 Taken: Start Time: 14:50 Percent of Wound Bed Debrided: 100% T Area Debrided (cm): otal 0.05 Tissue and other material debrided: Viable, Non-Viable, Callus, Slough, Subcutaneous, Slough Level: Skin/Subcutaneous Tissue Debridement Description: Excisional Instrument: Curette Bleeding: Minimum Hemostasis Achieved: Silver Nitrate End Time: 14:57 Procedural Pain: 0 Post Procedural Pain: 0 [name], [name]  ([id number]) [id number] Page 2 of 9 Response to Treatment: Procedure was tolerated well Level of Consciousness (Post- Awake and Alert procedure): Post Debridement Measurements of Total Wound Length: (cm) 0.3 Width: (cm) 0.2 Depth: (cm) 0.1 Volume: (cm) 0.005 Character of Wound/Ulcer Post Debridement: Improved Severity of Tissue Post Debridement: Fat layer exposed Post Procedure Diagnosis Same as Pre-procedure Electronic Signature(s) Signed: 04/04/2023 9:00:01 AM By: Allen Derry PA-C Signed: 04/07/2023 8:30:37 AM By: Yevonne Pax RN Entered By: Yevonne Pax on 04/03/2023 12:00:03 -------------------------------------------------------------------------------- HPI Details Patient Name: Date of Service: Harold Lovely HN M. 04/03/2023 2:30 PM Medical Record Number: [id number] Patient Account Number: [id number] Date of Birth/Sex: Treating RN: 27-Nov-1960 (62 y.o. [name] Primary Care Provider: [name] Other Clinician: Referring Provider: Treating Provider/Extender: [name] in Treatment: 4 History of Present Illness Chronic/Inactive Conditions Condition 1: 03-04-2023 patient's arterial screening today actually appears to show some signs of being somewhat low at 0.83 on the left 1.53 on the right which is essentially noncompressible. I really do not think we can assure ourselves of these readings and I think he really should see vascular for a thorough arterial evaluation in order to ensure that he has good blood flow although I suspect he probably does. HPI Description: 07/20/17 on evaluation today patient actually appears to be doing somewhat poorly in regard to his right plantar great toe ulcer. This is something that he states started as what he thought to be a blister May 04, 2018. Subsequently he treated this on his own for a while thinking that it would just get better. As it did not get better he  eventually did go to have this evaluated where it was treated appropriately with it sounds like Bactroban ointment however the patient states that it never completely resolved. Subsequently if this continue to get worse and was looking more irritated and inflamed he did go to the hospital more recently where he was prescribed Keflex. Subsequently he was also referred to the wound care center here for further evaluation by [place]. He had an x-ray in the ER  at home using some Betadine on the area but really nothing else has been done up to this point. This is actually his first office for his care is concerned in a clinic setting. Patient's past medical history actually is very similar to previous when I saw him. He does have a history of diabetes mellitus type 2, peripheral vascular disease, chronic kidney disease stage IV, hypertension, he has not had any formal arterial testing up to this point. Patient is likely going require an x-ray today. 8/27; patient was seen for the first time last week he has a wound over the metatarsal head on the right foot.  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 Allena Katz of podiatry. 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 He has been using Hydrofera Blue and sit to vet to the wound area. He had an x-ray done that showed faint sclerosis of the head of the second metatarsal which was nonspecific but new from 07/16/2018 which could be posttraumatic secondary to avascular necrosis or chronic infection 03-20-2023 upon evaluation today patient appears to be doing pretty well currently in regard to the plantar aspect of his foot. This is actually showing signs of improvement which is good news. So far I am seeing enough improvement and I am pretty pleased with where we are at although I did tell him that if he does not continue to see ongoing size improvements that we may want to consider a total contact cast. 03-27-2023 upon evaluation today patient appears to be doing well currently in regard to his wound. Has been tolerating the dressing changes without complication. Fortunately I do not see any evidence of worsening or infection overall. I am very pleased with where we stand today. 04-03-2023 upon evaluation today patient appears to be doing well currently in regard to his wound on the foot. Fortunately I do not see any signs of active infection locally or systemically at this time. With that being said the wound is getting very tiny and I think he is doing quite well. Electronic Signature(s) Signed: 04/03/2023 3:17:15 PM By: Allen Derry PA-C Entered By: Allen Derry on 04/03/2023 12:17:15 -------------------------------------------------------------------------------- Physical Exam Details Patient Name: Date of Service: Harold Mould M. 04/03/2023 2:30 PM Medical Record Number: [id number] Patient Account Number: [id number] Date of Birth/Sex: Treating RN: 08-18-1960 (62 y.o. [name] Primary Care Provider: [name] Other  Clinician: Referring Provider: Treating Provider/Extender: [name] in Treatment: 4 Constitutional Well-nourished and well-hydrated in no acute distress. Respiratory KATSUMI, PAULOVICH ([id number]) [id number] Page 4 of 9 normal breathing without difficulty. Psychiatric this patient is able to make decisions and demonstrates good insight into disease process. Alert and Oriented x 3. pleasant and cooperative. Notes Upon inspection patient's wound bed actually showed signs of good granulation and epithelization at this point. Fortunately I do not see any signs of worsening overall and I believe that the patient is making headway towards complete closure. This is good news. Electronic Signature(s) Signed: 04/03/2023 3:17:27 PM By: Allen Derry PA-C Entered By: Allen Derry on 04/03/2023 12:17:27 -------------------------------------------------------------------------------- Physician Orders Details Patient Name: Date of Service: Harold Mould M. 04/03/2023 2:30 PM Medical Record Number: [id number] Patient Account Number: [id number] Date of Birth/Sex: Treating RN: 1960/11/17 (62 y.o. [name] Primary Care Provider: [name] Other Clinician: Referring Provider: Treating Provider/Extender: [name] in Treatment: 4 Verbal / Phone Orders:  at home using some Betadine on the area but really nothing else has been done up to this point. This is actually his first office for his care is concerned in a clinic setting. Patient's past medical history actually is very similar to previous when I saw him. He does have a history of diabetes mellitus type 2, peripheral vascular disease, chronic kidney disease stage IV, hypertension, he has not had any formal arterial testing up to this point. Patient is likely going require an x-ray today. 8/27; patient was seen for the first time last week he has a wound over the metatarsal head on the right foot.  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 Allena Katz of podiatry. 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 He has been using Hydrofera Blue and sit to vet to the wound area. He had an x-ray done that showed faint sclerosis of the head of the second metatarsal which was nonspecific but new from 07/16/2018 which could be posttraumatic secondary to avascular necrosis or chronic infection 03-20-2023 upon evaluation today patient appears to be doing pretty well currently in regard to the plantar aspect of his foot. This is actually showing signs of improvement which is good news. So far I am seeing enough improvement and I am pretty pleased with where we are at although I did tell him that if he does not continue to see ongoing size improvements that we may want to consider a total contact cast. 03-27-2023 upon evaluation today patient appears to be doing well currently in regard to his wound. Has been tolerating the dressing changes without complication. Fortunately I do not see any evidence of worsening or infection overall. I am very pleased with where we stand today. 04-03-2023 upon evaluation today patient appears to be doing well currently in regard to his wound on the foot. Fortunately I do not see any signs of active infection locally or systemically at this time. With that being said the wound is getting very tiny and I think he is doing quite well. Electronic Signature(s) Signed: 04/03/2023 3:17:15 PM By: Allen Derry PA-C Entered By: Allen Derry on 04/03/2023 12:17:15 -------------------------------------------------------------------------------- Physical Exam Details Patient Name: Date of Service: Harold Mould M. 04/03/2023 2:30 PM Medical Record Number: [id number] Patient Account Number: [id number] Date of Birth/Sex: Treating RN: 08-18-1960 (62 y.o. [name] Primary Care Provider: [name] Other  Clinician: Referring Provider: Treating Provider/Extender: [name] in Treatment: 4 Constitutional Well-nourished and well-hydrated in no acute distress. Respiratory KATSUMI, PAULOVICH ([id number]) [id number] Page 4 of 9 normal breathing without difficulty. Psychiatric this patient is able to make decisions and demonstrates good insight into disease process. Alert and Oriented x 3. pleasant and cooperative. Notes Upon inspection patient's wound bed actually showed signs of good granulation and epithelization at this point. Fortunately I do not see any signs of worsening overall and I believe that the patient is making headway towards complete closure. This is good news. Electronic Signature(s) Signed: 04/03/2023 3:17:27 PM By: Allen Derry PA-C Entered By: Allen Derry on 04/03/2023 12:17:27 -------------------------------------------------------------------------------- Physician Orders Details Patient Name: Date of Service: Harold Mould M. 04/03/2023 2:30 PM Medical Record Number: [id number] Patient Account Number: [id number] Date of Birth/Sex: Treating RN: 1960/11/17 (62 y.o. [name] Primary Care Provider: [name] Other Clinician: Referring Provider: Treating Provider/Extender: [name] in Treatment: 4 Verbal / Phone Orders:  [name], [name] ([id number]) [id number] Page 1 of 9 Visit Report for 04/03/2023 Chief Complaint Document Details Patient Name: Date of Service: Harold Lovely [facility] M. 04/03/2023 2:30 PM Medical Record Number: [id number] Patient Account Number: [id number] Date of Birth/Sex: Treating RN: 1960-09-04 (62 y.o. [name]) Yevonne Pax Primary Care Provider: [name] Other Clinician: Referring Provider: Treating Provider/Extender: [name] in Treatment: 4 Information Obtained from: Patient Chief Complaint Right plantar foot ulcer Electronic Signature(s) Signed: 04/03/2023 2:37:23 PM By: Allen Derry PA-C Entered By: Allen Derry on 04/03/2023 11:37:23 -------------------------------------------------------------------------------- Debridement Details Patient Name: Date of Service: Harold Lovely HN M. 04/03/2023 2:30 PM Medical Record Number: [id number] Patient Account Number: [id number] Date of Birth/Sex: Treating RN: [birth year] (62 y.o. [name] Primary Care Provider: [name] Other Clinician: Referring Provider: Treating Provider/Extender: [name] in Treatment: 4 Debridement Performed for Assessment: Wound #2 Right,Plantar Foot Performed By: Physician Allen Derry, PA-C Debridement Type: Debridement Severity of Tissue Pre Debridement: Fat layer exposed Level of Consciousness (Pre-procedure): Awake and Alert Pre-procedure Verification/Time Out Yes - 14:50 Taken: Start Time: 14:50 Percent of Wound Bed Debrided: 100% T Area Debrided (cm): otal 0.05 Tissue and other material debrided: Viable, Non-Viable, Callus, Slough, Subcutaneous, Slough Level: Skin/Subcutaneous Tissue Debridement Description: Excisional Instrument: Curette Bleeding: Minimum Hemostasis Achieved: Silver Nitrate End Time: 14:57 Procedural Pain: 0 Post Procedural Pain: 0 [name], [name]  ([id number]) [id number] Page 2 of 9 Response to Treatment: Procedure was tolerated well Level of Consciousness (Post- Awake and Alert procedure): Post Debridement Measurements of Total Wound Length: (cm) 0.3 Width: (cm) 0.2 Depth: (cm) 0.1 Volume: (cm) 0.005 Character of Wound/Ulcer Post Debridement: Improved Severity of Tissue Post Debridement: Fat layer exposed Post Procedure Diagnosis Same as Pre-procedure Electronic Signature(s) Signed: 04/04/2023 9:00:01 AM By: Allen Derry PA-C Signed: 04/07/2023 8:30:37 AM By: Yevonne Pax RN Entered By: Yevonne Pax on 04/03/2023 12:00:03 -------------------------------------------------------------------------------- HPI Details Patient Name: Date of Service: Harold Lovely HN M. 04/03/2023 2:30 PM Medical Record Number: [id number] Patient Account Number: [id number] Date of Birth/Sex: Treating RN: 27-Nov-1960 (62 y.o. [name] Primary Care Provider: [name] Other Clinician: Referring Provider: Treating Provider/Extender: [name] in Treatment: 4 History of Present Illness Chronic/Inactive Conditions Condition 1: 03-04-2023 patient's arterial screening today actually appears to show some signs of being somewhat low at 0.83 on the left 1.53 on the right which is essentially noncompressible. I really do not think we can assure ourselves of these readings and I think he really should see vascular for a thorough arterial evaluation in order to ensure that he has good blood flow although I suspect he probably does. HPI Description: 07/20/17 on evaluation today patient actually appears to be doing somewhat poorly in regard to his right plantar great toe ulcer. This is something that he states started as what he thought to be a blister May 04, 2018. Subsequently he treated this on his own for a while thinking that it would just get better. As it did not get better he  eventually did go to have this evaluated where it was treated appropriately with it sounds like Bactroban ointment however the patient states that it never completely resolved. Subsequently if this continue to get worse and was looking more irritated and inflamed he did go to the hospital more recently where he was prescribed Keflex. Subsequently he was also referred to the wound care center here for further evaluation by [place]. He had an x-ray in the ER  [name], [name] ([id number]) [id number] Page 1 of 9 Visit Report for 04/03/2023 Chief Complaint Document Details Patient Name: Date of Service: Harold Lovely [facility] M. 04/03/2023 2:30 PM Medical Record Number: [id number] Patient Account Number: [id number] Date of Birth/Sex: Treating RN: 1960-09-04 (62 y.o. [name]) Yevonne Pax Primary Care Provider: [name] Other Clinician: Referring Provider: Treating Provider/Extender: [name] in Treatment: 4 Information Obtained from: Patient Chief Complaint Right plantar foot ulcer Electronic Signature(s) Signed: 04/03/2023 2:37:23 PM By: Allen Derry PA-C Entered By: Allen Derry on 04/03/2023 11:37:23 -------------------------------------------------------------------------------- Debridement Details Patient Name: Date of Service: Harold Lovely HN M. 04/03/2023 2:30 PM Medical Record Number: [id number] Patient Account Number: [id number] Date of Birth/Sex: Treating RN: [birth year] (62 y.o. [name] Primary Care Provider: [name] Other Clinician: Referring Provider: Treating Provider/Extender: [name] in Treatment: 4 Debridement Performed for Assessment: Wound #2 Right,Plantar Foot Performed By: Physician Allen Derry, PA-C Debridement Type: Debridement Severity of Tissue Pre Debridement: Fat layer exposed Level of Consciousness (Pre-procedure): Awake and Alert Pre-procedure Verification/Time Out Yes - 14:50 Taken: Start Time: 14:50 Percent of Wound Bed Debrided: 100% T Area Debrided (cm): otal 0.05 Tissue and other material debrided: Viable, Non-Viable, Callus, Slough, Subcutaneous, Slough Level: Skin/Subcutaneous Tissue Debridement Description: Excisional Instrument: Curette Bleeding: Minimum Hemostasis Achieved: Silver Nitrate End Time: 14:57 Procedural Pain: 0 Post Procedural Pain: 0 [name], [name]  ([id number]) [id number] Page 2 of 9 Response to Treatment: Procedure was tolerated well Level of Consciousness (Post- Awake and Alert procedure): Post Debridement Measurements of Total Wound Length: (cm) 0.3 Width: (cm) 0.2 Depth: (cm) 0.1 Volume: (cm) 0.005 Character of Wound/Ulcer Post Debridement: Improved Severity of Tissue Post Debridement: Fat layer exposed Post Procedure Diagnosis Same as Pre-procedure Electronic Signature(s) Signed: 04/04/2023 9:00:01 AM By: Allen Derry PA-C Signed: 04/07/2023 8:30:37 AM By: Yevonne Pax RN Entered By: Yevonne Pax on 04/03/2023 12:00:03 -------------------------------------------------------------------------------- HPI Details Patient Name: Date of Service: Harold Lovely HN M. 04/03/2023 2:30 PM Medical Record Number: [id number] Patient Account Number: [id number] Date of Birth/Sex: Treating RN: 27-Nov-1960 (62 y.o. [name] Primary Care Provider: [name] Other Clinician: Referring Provider: Treating Provider/Extender: [name] in Treatment: 4 History of Present Illness Chronic/Inactive Conditions Condition 1: 03-04-2023 patient's arterial screening today actually appears to show some signs of being somewhat low at 0.83 on the left 1.53 on the right which is essentially noncompressible. I really do not think we can assure ourselves of these readings and I think he really should see vascular for a thorough arterial evaluation in order to ensure that he has good blood flow although I suspect he probably does. HPI Description: 07/20/17 on evaluation today patient actually appears to be doing somewhat poorly in regard to his right plantar great toe ulcer. This is something that he states started as what he thought to be a blister May 04, 2018. Subsequently he treated this on his own for a while thinking that it would just get better. As it did not get better he  eventually did go to have this evaluated where it was treated appropriately with it sounds like Bactroban ointment however the patient states that it never completely resolved. Subsequently if this continue to get worse and was looking more irritated and inflamed he did go to the hospital more recently where he was prescribed Keflex. Subsequently he was also referred to the wound care center here for further evaluation by [place]. He had an x-ray in the ER

## 2023-04-07 NOTE — Progress Notes (Signed)
[name]: [name] Other Clinician: Referring [name]: Treating [name]/Extender: [name] in Treatment: 4 Wound Status Wound Number: 2 Primary Etiology: Diabetic Wound/Ulcer of the Lower Extremity Wound Location: Right, Plantar Foot Wound Status: Open Wounding Event: Pressure Injury Comorbid History: Type II Diabetes Date Acquired: 01/02/2023 Weeks Of Treatment: 4 Clustered Wound: No Photos Wound Measurements Length: (cm) 0.3 Width: (cm) 0.2 Depth: (cm) 0.2 Area: (cm) 0.04 Volume: (cm) 0.00 [name] ([id number]) % Reduction in Area: -51.6% % Reduction in Volume: -50% Epithelialization: Small (1-33%) 7 Tunneling: No 9 Undermining: No [id number] Page 8 of 9 Wound Description Classification: Grade 1 Exudate Amount: Medium Exudate Type: Serosanguineous Exudate Color: red, brown Foul Odor After Cleansing: No Slough/Fibrino No Wound Bed Granulation Amount: None Present (0%) Exposed Structure Necrotic Amount: None Present (0%) Fascia Exposed: No Fat Layer (Subcutaneous Tissue) Exposed: No Tendon Exposed: No Muscle Exposed: No Joint Exposed: No Bone Exposed: No Treatment Notes Wound #2 (Foot) Wound Laterality: Plantar, Right Cleanser Soap and Water Discharge Instruction: Gently cleanse wound with antibacterial soap, rinse and pat dry prior to dressing wounds Peri-Wound Care Topical Primary Dressing Hydrofera Blue Ready Transfer Foam, 2.5x2.5 (in/in) Discharge Instruction: Apply Hydrofera Blue Ready to wound bed as directed Secondary Dressing (BORDER) Zetuvit Plus SILICONE BORDER Dressing 4x4 (in/in) Discharge Instruction: Please do not put silicone bordered dressings under wraps. Use non-bordered dressing only. Secured With Compression Wrap Compression Stockings Geologist, engineering) Signed: 04/07/2023 8:30:37 AM By: Harold Pax RN Entered By: [name] on 04/03/2023 11:20:56 -------------------------------------------------------------------------------- Vitals Details Patient Name: Date of Service: Harold Lovely HN [name]. 04/03/2023 2:30 PM Medical Record Number: [id number] Patient Account Number: [id number] Date of Birth/Sex: Treating RN: 11-30-1960 (62 y.o. [name] Primary Care [name]: [name] Other Clinician: Referring [name]: Treating [name]/Extender: [name] in Treatment: 4 Vital Signs Time Taken: 14:14 Temperature (F): 98.2 Height (in): 68 Pulse (bpm): 64 Weight (lbs): 209 Respiratory Rate (breaths/min): 18 [name], [name] ([id number]) [id number] Page 9 of 9 Body Mass Index (BMI): 31.8 Blood Pressure (mmHg): 172/71 Reference Range: 80 - 120 mg / dl Electronic Signature(s) Signed: 04/07/2023 8:30:37 AM By: Harold Pax RN Entered By: [name] on 04/03/2023 11:16:48  [name], [name] [name] ([id number]) [id number] Page 1 of 9 Visit Report for 04/03/2023 Arrival Information Details Patient Name: Date of Service: Harold Lovely [facility] [name]. 04/03/2023 2:30 PM Medical Record Number: [id number] Patient Account Number: [id number] Date of Birth/Sex: Treating RN: 1961-05-14 (62 y.o. [name]) [name] Primary Care Alisandra Son: [name] Other Clinician: Referring [name]: Treating [name]/Extender: [name] in Treatment: 4 Visit Information History Since Last Visit Added or deleted any medications: No Patient Arrived: Ambulatory Any new allergies or adverse reactions: No Arrival Time: 14:11 Had a fall or experienced change in No Accompanied By: WIFE activities of daily living that may affect Transfer Assistance: None risk of falls: Patient Identification Verified: Yes Signs or symptoms of abuse/neglect since last visito No Secondary Verification Process Completed: Yes Hospitalized since last visit: No Patient Requires Transmission-Based Precautions: No Implantable device outside of the clinic excluding No Patient Has Alerts: Yes cellular tissue based products placed in the center Patient Alerts: Diabetic Type 2 since last visit: Has Dressing in Place as Prescribed: Yes Pain Present Now: No Electronic Signature(s) Signed: 04/07/2023 8:30:37 AM By: Harold Pax RN Entered By: [name] on 04/03/2023 11:15:56 -------------------------------------------------------------------------------- Clinic Level of Care Assessment Details Patient Name: Date of Service: Harold Mould [name]. 04/03/2023 2:30 PM Medical Record Number: [id number] Patient Account Number: [id number] Date of Birth/Sex: Treating RN: [birth year] (62 y.o. [name] Primary Care [name]: [name] Other Clinician: Referring [name]: Treating [name]/Extender: [name] in  Treatment: 4 Clinic Level of Care Assessment Items TOOL 1 Quantity Score []  - 0 Use when EandM and Procedure is performed on INITIAL visit ASSESSMENTS - Nursing Assessment / Reassessment []  - 0 General Physical Exam (combine w/ comprehensive assessment (listed just below) when performed on new pt. evals) []  - 0 Comprehensive Assessment (HX, ROS, Risk Assessments, Wounds Hx, etc.) [name], [name] ([id number]) [id number] Page 2 of 9 ASSESSMENTS - Wound and Skin Assessment / Reassessment []  - 0 Dermatologic / Skin Assessment (not related to wound area) ASSESSMENTS - Ostomy and/or Continence Assessment and Care []  - 0 Incontinence Assessment and Management []  - 0 Ostomy Care Assessment and Management (repouching, etc.) PROCESS - Coordination of Care []  - 0 Simple Patient / Family Education for ongoing care []  - 0 Complex (extensive) Patient / Family Education for ongoing care []  - 0 Staff obtains Chiropractor, Records, T Results / Process Orders est []  - 0 Staff telephones HHA, Nursing Homes / Clarify orders / etc []  - 0 Routine Transfer to another Facility (non-emergent condition) []  - 0 Routine Hospital Admission (non-emergent condition) []  - 0 New Admissions / Manufacturing engineer / Ordering NPWT Apligraf, etc. , []  - 0 Emergency Hospital Admission (emergent condition) PROCESS - Special Needs []  - 0 Pediatric / Minor Patient Management []  - 0 Isolation Patient Management []  - 0 Hearing / Language / Visual special needs []  - 0 Assessment of Community assistance (transportation, D/C planning, etc.) []  - 0 Additional assistance / Altered mentation []  - 0 Support Surface(s) Assessment (bed, cushion, seat, etc.) INTERVENTIONS - Miscellaneous []  - 0 External ear exam []  - 0 Patient Transfer (multiple staff / Nurse, adult / Similar devices) []  - 0 Simple Staple / Suture removal (25 or less) []  - 0 Complex Staple / Suture removal (26 or  more) []  - 0 Hypo/Hyperglycemic Management (do not check if billed separately) []  - 0 Ankle / Brachial Index (ABI) - do not check if billed separately Has the patient been seen  [name]: [name] Other Clinician: Referring [name]: Treating [name]/Extender: [name] in Treatment: 4 Wound Status Wound Number: 2 Primary Etiology: Diabetic Wound/Ulcer of the Lower Extremity Wound Location: Right, Plantar Foot Wound Status: Open Wounding Event: Pressure Injury Comorbid History: Type II Diabetes Date Acquired: 01/02/2023 Weeks Of Treatment: 4 Clustered Wound: No Photos Wound Measurements Length: (cm) 0.3 Width: (cm) 0.2 Depth: (cm) 0.2 Area: (cm) 0.04 Volume: (cm) 0.00 [name] ([id number]) % Reduction in Area: -51.6% % Reduction in Volume: -50% Epithelialization: Small (1-33%) 7 Tunneling: No 9 Undermining: No [id number] Page 8 of 9 Wound Description Classification: Grade 1 Exudate Amount: Medium Exudate Type: Serosanguineous Exudate Color: red, brown Foul Odor After Cleansing: No Slough/Fibrino No Wound Bed Granulation Amount: None Present (0%) Exposed Structure Necrotic Amount: None Present (0%) Fascia Exposed: No Fat Layer (Subcutaneous Tissue) Exposed: No Tendon Exposed: No Muscle Exposed: No Joint Exposed: No Bone Exposed: No Treatment Notes Wound #2 (Foot) Wound Laterality: Plantar, Right Cleanser Soap and Water Discharge Instruction: Gently cleanse wound with antibacterial soap, rinse and pat dry prior to dressing wounds Peri-Wound Care Topical Primary Dressing Hydrofera Blue Ready Transfer Foam, 2.5x2.5 (in/in) Discharge Instruction: Apply Hydrofera Blue Ready to wound bed as directed Secondary Dressing (BORDER) Zetuvit Plus SILICONE BORDER Dressing 4x4 (in/in) Discharge Instruction: Please do not put silicone bordered dressings under wraps. Use non-bordered dressing only. Secured With Compression Wrap Compression Stockings Geologist, engineering) Signed: 04/07/2023 8:30:37 AM By: Harold Pax RN Entered By: [name] on 04/03/2023 11:20:56 -------------------------------------------------------------------------------- Vitals Details Patient Name: Date of Service: Harold Lovely HN [name]. 04/03/2023 2:30 PM Medical Record Number: [id number] Patient Account Number: [id number] Date of Birth/Sex: Treating RN: 11-30-1960 (62 y.o. [name] Primary Care [name]: [name] Other Clinician: Referring [name]: Treating [name]/Extender: [name] in Treatment: 4 Vital Signs Time Taken: 14:14 Temperature (F): 98.2 Height (in): 68 Pulse (bpm): 64 Weight (lbs): 209 Respiratory Rate (breaths/min): 18 [name], [name] ([id number]) [id number] Page 9 of 9 Body Mass Index (BMI): 31.8 Blood Pressure (mmHg): 172/71 Reference Range: 80 - 120 mg / dl Electronic Signature(s) Signed: 04/07/2023 8:30:37 AM By: Harold Pax RN Entered By: [name] on 04/03/2023 11:16:48  [name], [name] [name] ([id number]) [id number] Page 1 of 9 Visit Report for 04/03/2023 Arrival Information Details Patient Name: Date of Service: Harold Lovely [facility] [name]. 04/03/2023 2:30 PM Medical Record Number: [id number] Patient Account Number: [id number] Date of Birth/Sex: Treating RN: 1961-05-14 (62 y.o. [name]) [name] Primary Care Alisandra Son: [name] Other Clinician: Referring [name]: Treating [name]/Extender: [name] in Treatment: 4 Visit Information History Since Last Visit Added or deleted any medications: No Patient Arrived: Ambulatory Any new allergies or adverse reactions: No Arrival Time: 14:11 Had a fall or experienced change in No Accompanied By: WIFE activities of daily living that may affect Transfer Assistance: None risk of falls: Patient Identification Verified: Yes Signs or symptoms of abuse/neglect since last visito No Secondary Verification Process Completed: Yes Hospitalized since last visit: No Patient Requires Transmission-Based Precautions: No Implantable device outside of the clinic excluding No Patient Has Alerts: Yes cellular tissue based products placed in the center Patient Alerts: Diabetic Type 2 since last visit: Has Dressing in Place as Prescribed: Yes Pain Present Now: No Electronic Signature(s) Signed: 04/07/2023 8:30:37 AM By: Harold Pax RN Entered By: [name] on 04/03/2023 11:15:56 -------------------------------------------------------------------------------- Clinic Level of Care Assessment Details Patient Name: Date of Service: Harold Mould [name]. 04/03/2023 2:30 PM Medical Record Number: [id number] Patient Account Number: [id number] Date of Birth/Sex: Treating RN: [birth year] (62 y.o. [name] Primary Care [name]: [name] Other Clinician: Referring [name]: Treating [name]/Extender: [name] in  Treatment: 4 Clinic Level of Care Assessment Items TOOL 1 Quantity Score []  - 0 Use when EandM and Procedure is performed on INITIAL visit ASSESSMENTS - Nursing Assessment / Reassessment []  - 0 General Physical Exam (combine w/ comprehensive assessment (listed just below) when performed on new pt. evals) []  - 0 Comprehensive Assessment (HX, ROS, Risk Assessments, Wounds Hx, etc.) [name], [name] ([id number]) [id number] Page 2 of 9 ASSESSMENTS - Wound and Skin Assessment / Reassessment []  - 0 Dermatologic / Skin Assessment (not related to wound area) ASSESSMENTS - Ostomy and/or Continence Assessment and Care []  - 0 Incontinence Assessment and Management []  - 0 Ostomy Care Assessment and Management (repouching, etc.) PROCESS - Coordination of Care []  - 0 Simple Patient / Family Education for ongoing care []  - 0 Complex (extensive) Patient / Family Education for ongoing care []  - 0 Staff obtains Chiropractor, Records, T Results / Process Orders est []  - 0 Staff telephones HHA, Nursing Homes / Clarify orders / etc []  - 0 Routine Transfer to another Facility (non-emergent condition) []  - 0 Routine Hospital Admission (non-emergent condition) []  - 0 New Admissions / Manufacturing engineer / Ordering NPWT Apligraf, etc. , []  - 0 Emergency Hospital Admission (emergent condition) PROCESS - Special Needs []  - 0 Pediatric / Minor Patient Management []  - 0 Isolation Patient Management []  - 0 Hearing / Language / Visual special needs []  - 0 Assessment of Community assistance (transportation, D/C planning, etc.) []  - 0 Additional assistance / Altered mentation []  - 0 Support Surface(s) Assessment (bed, cushion, seat, etc.) INTERVENTIONS - Miscellaneous []  - 0 External ear exam []  - 0 Patient Transfer (multiple staff / Nurse, adult / Similar devices) []  - 0 Simple Staple / Suture removal (25 or less) []  - 0 Complex Staple / Suture removal (26 or  more) []  - 0 Hypo/Hyperglycemic Management (do not check if billed separately) []  - 0 Ankle / Brachial Index (ABI) - do not check if billed separately Has the patient been seen

## 2023-04-09 ENCOUNTER — Ambulatory Visit: Payer: Medicaid Other | Attending: Family | Admitting: Family

## 2023-04-09 ENCOUNTER — Encounter: Payer: Self-pay | Admitting: Pharmacy Technician

## 2023-04-09 ENCOUNTER — Encounter: Payer: Self-pay | Admitting: Family

## 2023-04-09 VITALS — BP 118/72 | HR 59 | Wt 204.0 lb

## 2023-04-09 DIAGNOSIS — I255 Ischemic cardiomyopathy: Secondary | ICD-10-CM | POA: Insufficient documentation

## 2023-04-09 DIAGNOSIS — Z79899 Other long term (current) drug therapy: Secondary | ICD-10-CM | POA: Insufficient documentation

## 2023-04-09 DIAGNOSIS — Z794 Long term (current) use of insulin: Secondary | ICD-10-CM | POA: Diagnosis not present

## 2023-04-09 DIAGNOSIS — I5022 Chronic systolic (congestive) heart failure: Secondary | ICD-10-CM

## 2023-04-09 DIAGNOSIS — J449 Chronic obstructive pulmonary disease, unspecified: Secondary | ICD-10-CM | POA: Diagnosis not present

## 2023-04-09 DIAGNOSIS — E785 Hyperlipidemia, unspecified: Secondary | ICD-10-CM | POA: Insufficient documentation

## 2023-04-09 DIAGNOSIS — E1122 Type 2 diabetes mellitus with diabetic chronic kidney disease: Secondary | ICD-10-CM | POA: Insufficient documentation

## 2023-04-09 DIAGNOSIS — G4733 Obstructive sleep apnea (adult) (pediatric): Secondary | ICD-10-CM | POA: Diagnosis not present

## 2023-04-09 DIAGNOSIS — Z7982 Long term (current) use of aspirin: Secondary | ICD-10-CM | POA: Diagnosis not present

## 2023-04-09 DIAGNOSIS — Z955 Presence of coronary angioplasty implant and graft: Secondary | ICD-10-CM | POA: Insufficient documentation

## 2023-04-09 DIAGNOSIS — I10 Essential (primary) hypertension: Secondary | ICD-10-CM | POA: Diagnosis not present

## 2023-04-09 DIAGNOSIS — E1142 Type 2 diabetes mellitus with diabetic polyneuropathy: Secondary | ICD-10-CM | POA: Diagnosis not present

## 2023-04-09 DIAGNOSIS — N184 Chronic kidney disease, stage 4 (severe): Secondary | ICD-10-CM

## 2023-04-09 DIAGNOSIS — I130 Hypertensive heart and chronic kidney disease with heart failure and stage 1 through stage 4 chronic kidney disease, or unspecified chronic kidney disease: Secondary | ICD-10-CM | POA: Insufficient documentation

## 2023-04-09 DIAGNOSIS — I2510 Atherosclerotic heart disease of native coronary artery without angina pectoris: Secondary | ICD-10-CM

## 2023-04-09 MED ORDER — HYDRALAZINE HCL 100 MG PO TABS: 100.0000 mg | ORAL_TABLET | Freq: Two times a day (BID) | ORAL | 3 refills | Status: DC

## 2023-04-09 MED ORDER — FUROSEMIDE 40 MG PO TABS: 40.0000 mg | ORAL_TABLET | Freq: Every day | ORAL | 1 refills | Status: DC

## 2023-04-09 NOTE — Progress Notes (Signed)
[facility] - PHARMACIST COUNSELING NOTE  Guideline-Directed Medical Therapy/Evidence Based Medicine  ACE/ARB/ARNI: Valsartan 80 mg daily Beta Blocker:  N/A Aldosterone Antagonist:  N/A Diuretic: Furosemide 40 mg daily SGLT2i:  N/A  Adherence Assessment  Do you ever forget to take your medication? [x] Yes [] No  Do you ever skip doses due to side effects? [] Yes [x] No  Do you have trouble affording your medicines? [] Yes [x] No  Are you ever unable to pick up your medication due to transportation difficulties? [] Yes [x] No  Do you ever stop taking your medications because you don't believe they are helping? [] Yes [x] No  Do you check your weight daily? [] Yes [x] No   Adherence strategy: Remembers to take (does not use a pillbox)  Barriers to obtaining medications: None  Vital signs: HR 59, BP 118/72, weight (pounds) 204 ECHO: Date 02/19/2023, EF 50-55%, notes Mild LVH; RV mildly enlarged; LA mildly dilated; MV degeneration with mild-to-moderate MVR; borderline dilatation of ascending aorta     Latest Ref Rng & Units 02/23/2023    6:17 AM 02/22/2023    5:04 AM 02/21/2023    3:48 AM  BMP  Glucose 70 - 99 mg/dL 962  952  841   BUN 8 - 23 mg/dL 80  81  83   Creatinine 0.61 - 1.24 mg/dL 3.24  4.01  0.27   Sodium 135 - 145 mmol/L 136  139  140   Potassium 3.5 - 5.1 mmol/L 4.4  4.3  4.7   Chloride 98 - 111 mmol/L 103  105  107   CO2 22 - 32 mmol/L 25  25  25    Calcium 8.9 - 10.3 mg/dL 8.1  8.3  8.0     Past Medical History:  Diagnosis Date   Ankle pain    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 10/2016 Echo: EF 25-30%, diff HK; b. 01/2017 Echo:  EF 40-45%, GrI DD; c. 08/2017 Echo: EF 40-45%, GrI DD; d. 08/2020 Echo: EF 45-50%, glob HK. Mod asymm LVH, GrI DD, Nl RV size/fxn; e. 08/2020 PYP: equivocal for ATTR cardiac amyloid.   CKD (chronic kidney disease), stage III - IV (HCC)     Coronary artery disease    a. 10/2016 Cath/PCI: LAD 47m (3.5x15 Xience Alpine DES). No other obstructive disease.   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 10/2016 Echo: EF 25-30%, diff HK; b. 01/2017 Echo:  EF 40-45%, GrI DD; c. 08/2017 Echo: EF 40-45%, GrI DD; d. 08/2020 Echo: EF 45-50%, glob HK. Mod asymm LVH, GrI DD, Nl RV size/fxn.   Pain in both feet     ASSESSMENT 62 year old male who presents to the HF clinic for follow-up. Patient has no complaints today. Confirmed with him (he also brought his medications) that he has discontinued clonidine (last dose 03/15/2023), discontinued losartan and is now taking valsartan 80 mg daily, and has decreased hydralazine 100 mg to twice daily. His bottle still has the old directions and shows zero refills, so I will ask [facility] provider to send new rx with accurate directions.  Patient's allergy to empagliflozin and renal function precludes further GDMT at this time.  Patient does not monitor his weight at home. I educated him and his wife on the importance of monitoring weights (and documenting them to bring to appointments) so that it can  be easier to detect fluid accumulation. Patient, especially his spouse, was receptive to this.  Recent ED Visit (past 6 months):  Date -  02/19/2023, [facility] - AoCHF  02/17/2023, Urgent care [facility] - Diabetic foot ulcer  PLAN HFpEF (EF 50-55%) / HTN Continue valsartan 80 mg daily and furosemide 40 mg daily Continue hydralazine 100 mg twice daily Consider beginning to wean doxazosin by decreasing dose to 1 mg nightly Begin to check and log weights daily (ideally) or at least several days a week CAD s/p LAD stent in 2018 / HLD 03/02/2023 LDL 71 Continue isosorbide mononitrate 60 mg twice daily and atorvastatin 40 mg nightly DM 01/30/2023 A1c 8.6% Allergy (rash) to empagliflozin and current renal function precludes consideration of SGLT2i Patient  did not tolerate liraglutide in 2019 but recommend consideration of semaglutide weekly injections (longer half-life of semaglutide compared to liraglutide means more stable drug levels (I.e., fewer peaks and troughs) which can help with tolerability Continue insulin glargine 40 units daily    Time spent: 20 minutes  Will M. Dareen Piano, PharmD Clinical Pharmacist 04/09/2023 2:07 PM

## 2023-04-09 NOTE — Progress Notes (Signed)
ADVANCED HF CLINIC NOTE  Primary Care: Smitty Cords, DO (last seen 08/24) Primary Cardiologist: Lorine Bears, MD (last seen 08/24) HF provider: Arvilla Meres, MD (last seen 06/24)   HPI:  [name] is a 62 y.o. male with history of hyperlipidemia, CAD w/ PCI and DES 10/2016, chronic systolic HF, DM2, HTN, CKD IV, ETOH abuse and PAD.   He is s/p drug-eluting stent placement to the LAD 4/818 in setting of acute HF.  At that time, he was found to have severe LV dysfunction with an EF 25-30% which was felt to be out of proportion to the LAD disease.  He also has a history of alcohol abuse. F/u echo 2018 EF 40-45%.    Admitted 02/19/23 due to acute on chronic heart failure. Cardiology and nephrology consults done. IV diuresing held. Antibiotic given for foot infection.   Echo 08/28/14: EF 40-45% with Grade I DD and mild MR Echo 08/16/20: EF 45-50%.  There was concern about possible cardiac amyloidosis.  PYP scan was equivocal for amyloidosis  Echo 08/08/22 EF 55-60%  Echo 02/19/23: EF 50-55% with mild LVH, Grade II DD and mild/ moderate MR  LHC 10/21/16:  Ost 2nd Diag to 2nd Diag lesion, 60 %stenosed. Mid Cx lesion, 50 %stenosed. Prox RCA lesion, 40 %stenosed. Mid RCA lesion, 20 %stenosed. A STENT XIENCE ALPINE RX 3.5X15 drug eluting stent was successfully placed. Mid LAD lesion, 90 %stenosed. Post intervention, there is a 0% residual stenosis.  1. Severe one-vessel coronary artery disease involving mid LAD. 2. Severely reduced LV systolic function by echo. Left ventricular angiography was not performed. LVEDP was only mildly elevated at 15 mmHg. 3. Successful angioplasty and drug-eluting stent placement to the mid LAD.  PFTs 2/24: FEV1 2.44 (71%) FVC 3.62 (79%) Ratio 67% DLCO 60% c/w mild obstruction  He presents today for a HF f/u visit with a chief complaint of minimal fatigue with moderate exertion. Chronic in nature. Has occasional dizziness along with this. He  denies shortness of breath, chest pain, cough, palpitations, abdominal distention, pedal edema or difficulty sleeping. Has been weaned off of clonidine.    At last visit his losartan was changed to valsartan & he says that he feels better with this change.   ROS: All systems negative except as listed in HPI, PMH and Problem List.  SH:  Social History   Socioeconomic History   Marital status: Married    Spouse name: Not on file   Number of children: Not on file   Years of education: Not on file   Highest education level: Not on file  Occupational History   Not on file  Tobacco Use   Smoking status: Never   Smokeless tobacco: Never  Vaping Use   Vaping status: Never Used  Substance and Sexual Activity   Alcohol use: Yes    Comment: occassionally   Drug use: No   Sexual activity: Not Currently  Other Topics Concern   Not on file  Social History Narrative   Not on file   Social Determinants of Health   Financial Resource Strain: Not on file  Food Insecurity: No Food Insecurity (02/20/2023)   Hunger Vital Sign    Worried About Running Out of Food in the Last Year: Never true    Ran Out of Food in the Last Year: Never true  Transportation Needs: No Transportation Needs (02/20/2023)   PRAPARE - Transportation    Lack of Transportation (Medical): No    Lack of  Transportation (Non-Medical): No  Physical Activity: Not on file  Stress: Not on file  Social Connections: Not on file  Intimate Partner Violence: Not At Risk (02/20/2023)   Humiliation, Afraid, Rape, and Kick questionnaire    Fear of Current or Ex-Partner: No    Emotionally Abused: No    Physically Abused: No    Sexually Abused: No    FH:  Family History  Problem Relation Age of Onset   Hypertension Mother    Heart disease Maternal Grandfather     Past Medical History:  Diagnosis Date   Ankle pain    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 10/2016  Echo: EF 25-30%, diff HK; b. 01/2017 Echo:  EF 40-45%, GrI DD; c. 08/2017 Echo: EF 40-45%, GrI DD; d. 08/2020 Echo: EF 45-50%, glob HK. Mod asymm LVH, GrI DD, Nl RV size/fxn; e. 08/2020 PYP: equivocal for ATTR cardiac amyloid.   CKD (chronic kidney disease), stage III - IV (HCC)    Coronary artery disease    a. 10/2016 Cath/PCI: LAD 35m (3.5x15 Xience Alpine DES). No other obstructive disease.   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 10/2016 Echo: EF 25-30%, diff HK; b. 01/2017 Echo:  EF 40-45%, GrI DD; c. 08/2017 Echo: EF 40-45%, GrI DD; d. 08/2020 Echo: EF 45-50%, glob HK. Mod asymm LVH, GrI DD, Nl RV size/fxn.   Pain in both feet     Current Outpatient Medications  Medication Sig Dispense Refill   ACCU-CHEK FASTCLIX LANCETS MISC Check sugar up to 3 x daily as instructed 102 each 12   ACCU-CHEK GUIDE test strip Check blood sugar up to 3 times daily as advised 100 each 12   acetaminophen (TYLENOL 8 HOUR) 650 MG CR tablet Take 1 tablet (650 mg total) by mouth every 8 (eight) hours as needed for pain.     aspirin EC 81 MG tablet Take 81 mg by mouth daily.     atorvastatin (LIPITOR) 40 MG tablet TAKE 1 TABLET BY MOUTH EVERY DAY 90 tablet 1   carvedilol (COREG) 6.25 MG tablet Take 1 tablet (6.25 mg total) by mouth 2 (two) times daily with a meal. 60 tablet 1   doxazosin (CARDURA) 1 MG tablet TAKE 2 TABLETS (2 MG TOTAL) BY MOUTH AT BEDTIME. 180 tablet 1   furosemide (LASIX) 40 MG tablet Take 1 tablet (40 mg total) by mouth daily. 30 tablet 1   gabapentin (NEURONTIN) 400 MG capsule Take 1 capsule (400 mg total) by mouth 2 (two) times daily. (Patient not taking: Reported on 03/13/2023) 360 capsule 1   GVOKE HYPOPEN 2-PACK 1 MG/0.2ML SOAJ Inject 1 mg into the skin as needed (hypoglycemia). 1 mL 2   hydrALAZINE (APRESOLINE) 100 MG tablet Take 1 tablet (100 mg total) by mouth 2 (two) times daily.     Insulin Pen Needle (B-D  ULTRAFINE III SHORT PEN) 31G X 8 MM MISC USE TO INJECT INSULIN NIGHTLY 100 each 6   isosorbide mononitrate (IMDUR) 60 MG 24 hr tablet TAKE 1 TABLET BY MOUTH 2 TIMES DAILY. 180 tablet 1   Lancets Misc. MISC Use  Brand compatable to insurance and monitor to check blood sugar up to 3 times daily. ICD10 E11.9 100 each 12   LANTUS SOLOSTAR 100 UNIT/ML Solostar Pen INJECT 40 UNITS INTO THE SKIN AT BEDTIME. 45 mL 1  valsartan (DIOVAN) 80 MG tablet Take 1 tablet (80 mg total) by mouth daily. 30 tablet 3   No current facility-administered medications for this visit.   Vitals:   04/09/23 1020  BP: 118/72  Pulse: (!) 59  SpO2: 99%  Weight: 204 lb (92.5 kg)   Wt Readings from Last 3 Encounters:  04/09/23 204 lb (92.5 kg)  03/13/23 202 lb 12.8 oz (92 kg)  03/05/23 211 lb (95.7 kg)   Lab Results  Component Value Date   CREATININE 3.73 (H) 02/23/2023   CREATININE 3.83 (H) 02/22/2023   CREATININE 4.08 (H) 02/21/2023   PHYSICAL EXAM:  General:  Well appearing. No resp difficulty HEENT: normal Neck: supple. JVP flat. No lymphadenopathy or thryomegaly appreciated. Cor: PMI normal. Regular rhythm, bradycardic. No rubs, gallops or murmurs. Lungs: clear Abdomen: soft, nontender, nondistended. No hepatosplenomegaly. No bruits or masses.  Extremities: no cyanosis, clubbing, rash, trace pedal edema bilaterally Neuro: alert & oriented x3, cranial nerves grossly intact. Moves all 4 extremities w/o difficulty. Affect pleasant.   ECG: 02/20/23 showed SB with LBBB   ASSESSMENT & PLAN:  1.  Ischemic cardiomyopathy with preserved ejection fraction-  - status post LAD stenting in April 2018 - Stable NYHA II - euvolemic - not weighing daily; reminded to call for overnight weight gain of > 2 pounds or a weekly weight gain of > 5 pounds - weight down 7 pounds from last visit 1 month ago - Echo 08/28/14: EF 40-45% with Grade I DD and mild MR - Echo 08/16/20: EF 45-50%.  There was concern about possible  cardiac amyloidosis.   - Echo 08/08/22 EF 55-60%  - Echo 02/19/23: EF 50-55% with mild LVH, Grade II DD and mild/ moderate MR - PYP 2/22 reviewed. H/CL 1.17 Negative for amyloid - continue valsartan 80mg  daily - continue lasix 40mg  daily - continue hydralazine 100 bid (regularly forgets noon dose) and imdur 60 daily - continue carvedilol 6.25mg  BID - not on Entresto, farxiga or spironolactone due to CKD (eGFR 16) - Jardiance was associated with generalized rash.  - BNP 02/19/23 was 1502.1 - PharmD reconciled meds w/ patient  2.  Coronary artery disease-  - Status post LAD stenting in April 2018.  - saw cardiology Kirke Corin) 08/24 - not candidate for repeat cath with CKD IV - continue ASA 81mg  daily - continue atorvastatin 40mg  daily - LDL 01/30/23 was 71 - LHC 10/21/16:  Ost 2nd Diag to 2nd Diag lesion, 60 %stenosed. Mid Cx lesion, 50 %stenosed. Prox RCA lesion, 40 %stenosed. Mid RCA lesion, 20 %stenosed. A STENT XIENCE ALPINE RX 3.5X15 drug eluting stent was successfully placed. Mid LAD lesion, 90 %stenosed. Post intervention, there is a 0% residual stenosis.  1. Severe one-vessel coronary artery disease involving mid LAD. 2. Severely reduced LV systolic function by echo. Left ventricular angiography was not performed. LVEDP was only mildly elevated at 15 mmHg. 3. Successful angioplasty and drug-eluting stent placement to the mid LAD.   3.  Essential hypertension-  - BP 118/72 - saw PCP Althea Charon) 08/24 - continue cardura but decrease this to 1mg  daily; if BP remains stable, plan to stop it at next visit - BMP 02/26/23 showed sodium 137, potassium 4.2, creatinine 4.09 & GFR 16 - BMP today  4. DM2- - per PCP - A1c 01/30/23 was 8.6% - glucose this AM was 97   5.  Stage IV chronic kidney disease- - CKD IV baseline Scr 3.1-3.3 - current eGFR too low for farxiga - saw nephrology Cherylann Ratel)  08/24  6. COPD- - PFTs 2/24: FEV1 2.44 (71%) FVC 3.62 (79%) Ratio 67% DLCO 60% c/w mild  obstruction - had upcoming pulmonology appt cancelled due to conflict  7. OSA- - sleep study done 01/20/23 and showed severe sleep apnea with REI of 46.5/ hour - patient says today that he has no desire to wear CPAP equipment - continue to discuss   Return in 1 month, sooner if needed.

## 2023-04-09 NOTE — Patient Instructions (Addendum)
Begin weighing daily and call for an overnight weight gain of 3 pounds or more or a weekly weight gain of more than 5 pounds.   Decrease doxazosin to 1 tablet at bedtime

## 2023-04-10 ENCOUNTER — Encounter: Payer: Self-pay | Admitting: Family

## 2023-04-10 ENCOUNTER — Other Ambulatory Visit: Payer: Self-pay | Admitting: Family

## 2023-04-10 ENCOUNTER — Encounter: Payer: Medicaid Other | Admitting: Physician Assistant

## 2023-04-10 DIAGNOSIS — I5022 Chronic systolic (congestive) heart failure: Secondary | ICD-10-CM

## 2023-04-10 DIAGNOSIS — E1122 Type 2 diabetes mellitus with diabetic chronic kidney disease: Secondary | ICD-10-CM | POA: Diagnosis not present

## 2023-04-10 DIAGNOSIS — E11622 Type 2 diabetes mellitus with other skin ulcer: Secondary | ICD-10-CM | POA: Diagnosis not present

## 2023-04-10 DIAGNOSIS — L97512 Non-pressure chronic ulcer of other part of right foot with fat layer exposed: Secondary | ICD-10-CM | POA: Diagnosis not present

## 2023-04-10 DIAGNOSIS — I129 Hypertensive chronic kidney disease with stage 1 through stage 4 chronic kidney disease, or unspecified chronic kidney disease: Secondary | ICD-10-CM | POA: Diagnosis not present

## 2023-04-10 DIAGNOSIS — N184 Chronic kidney disease, stage 4 (severe): Secondary | ICD-10-CM | POA: Diagnosis not present

## 2023-04-10 DIAGNOSIS — E11621 Type 2 diabetes mellitus with foot ulcer: Secondary | ICD-10-CM | POA: Diagnosis not present

## 2023-04-10 LAB — BASIC METABOLIC PANEL
BUN/Creatinine Ratio: 16 (ref 10–24)
BUN: 52 mg/dL — ABNORMAL HIGH (ref 8–27)
CO2: 20 mmol/L (ref 20–29)
Calcium: 7.7 mg/dL — ABNORMAL LOW (ref 8.6–10.2)
Chloride: 103 mmol/L (ref 96–106)
Creatinine, Ser: 3.32 mg/dL — ABNORMAL HIGH (ref 0.76–1.27)
Glucose: 111 mg/dL — ABNORMAL HIGH (ref 70–99)
Potassium: 5.5 mmol/L — ABNORMAL HIGH (ref 3.5–5.2)
Sodium: 140 mmol/L (ref 134–144)
eGFR: 20 mL/min/{1.73_m2} — ABNORMAL LOW (ref >59)

## 2023-04-10 NOTE — Progress Notes (Signed)
been seen at the hospital within the last three years: Yes Total Score: 0 Level Of Care: ____ Electronic Signature(s) Signed: 04/10/2023 4:15:38 PM By: Yevonne Pax RN Entered By: Yevonne Pax on 04/10/2023 08:21:38 -------------------------------------------------------------------------------- Encounter Discharge Information Details Patient Name: Date of Service: Harold Lovely HN M. 04/10/2023 8:00 A M Medical Record Number: [id number] Patient Account Number: [id number] Date of Birth/Sex: Treating RN: 07/22/1960 (62 y.o. [name] Primary Care Sonia Stickels: [name] Other Clinician: Referring Emauri Krygier: Treating Nelton Amsden/Extender: [name] in Treatment: 5 Edker, [place] ([id number]) [id number] Page 3 of 8 Encounter Discharge Information Items Post Procedure Vitals Discharge Condition: Stable Temperature (F): 98.1 Ambulatory Status: Ambulatory Pulse (bpm): 63 Discharge Destination: Home Respiratory Rate (breaths/min): 18 Transportation: Private Auto Blood Pressure (mmHg): 163/85 Accompanied By: self Schedule Follow-up Appointment: Yes Clinical Summary of Care: Electronic Signature(s) Signed: 04/10/2023 4:15:38 PM By: Yevonne Pax RN Entered By: Yevonne Pax on 04/10/2023 08:22:35 -------------------------------------------------------------------------------- Lower Extremity Assessment Details Patient Name: Date of Service: Harold Lovely HN M. 04/10/2023 8:00 A M Medical Record Number: [id number] Patient Account Number: [id number] Date of Birth/Sex: Treating RN: Mar 22, 1961 (62 y.o. [name]) Yevonne Pax Primary Care Trude Cansler: [name] Other Clinician: Referring Safir Michalec: Treating Armya Westerhoff/Extender: [name] in Treatment: 5 Electronic Signature(s) Signed: 04/10/2023 4:15:38 PM By: Yevonne Pax RN Entered By: Yevonne Pax on 04/10/2023 08:11:31 -------------------------------------------------------------------------------- Multi Wound Chart Details Patient Name: Date of Service: Harold Lovely HN M. 04/10/2023 8:00 A M Medical Record Number: [id number] Patient Account Number: [id number] Date of Birth/Sex: Treating RN: 04-17-1961 (62 y.o. 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 N/A N/A Comorbid History: 01/02/2023 N/A N/A Date Acquired: 5 N/A N/A Weeks of Treatment: Open N/A N/A Wound Status: No N/A N/A Wound Recurrence: 0.2x0.2x0.2 N/A N/A Measurements L x W x D (cm) 0.031 N/A N/A A (cm) : rea 0.006 N/A N/A Volume (cm) : 0.00% N/A N/A % Reduction in A rea: 0.00% N/A N/A % Reduction in Volume: Grade 1 N/A N/A Classification: Medium N/A N/A Exudate A mount: Serosanguineous N/A N/A Exudate Type: red, brown N/A N/A Exudate Color: Large (67-100%) N/A N/A Granulation A mount: Red N/A N/A Granulation Quality: None Present (0%) N/A N/A Necrotic A mount: Fat Layer (Subcutaneous Tissue): Yes N/A N/A Exposed Structures: Fascia: No Tendon: No Muscle: No Joint: No Bone: No Small (1-33%) N/A N/A Epithelialization: Treatment Notes Electronic Signature(s) Signed: 04/10/2023 4:15:38 PM By: Yevonne Pax  RN Entered By: Yevonne Pax on 04/10/2023 08:11:35 -------------------------------------------------------------------------------- Multi-Disciplinary Care Plan Details Patient Name: Date of Service: Harold Lovely HN M. 04/10/2023 8:00 A M Medical Record Number: [id number] Patient Account Number: [id number] Date of Birth/Sex: Treating RN: 1960-12-31 (62 y.o. [name] Primary Care Yarlin Breisch: [name] Other Clinician: Referring Taraneh Metheney: Treating Montgomery Rothlisberger/Extender: [name] in Treatment: 5 Active Inactive Wound/Skin Impairment Nursing Diagnoses: Impaired tissue integrity Knowledge deficit related to ulceration/compromised skin integrity Goals: [name], [name] ([id number]) [id number] Page 5 of 8 Patient/caregiver will verbalize understanding of skin care regimen Date Initiated: 03/04/2023 Target Resolution Date: 05/10/2023 Goal Status: Active Ulcer/skin breakdown will  been seen at the hospital within the last three years: Yes Total Score: 0 Level Of Care: ____ Electronic Signature(s) Signed: 04/10/2023 4:15:38 PM By: Yevonne Pax RN Entered By: Yevonne Pax on 04/10/2023 08:21:38 -------------------------------------------------------------------------------- Encounter Discharge Information Details Patient Name: Date of Service: Harold Lovely HN M. 04/10/2023 8:00 A M Medical Record Number: [id number] Patient Account Number: [id number] Date of Birth/Sex: Treating RN: 07/22/1960 (62 y.o. [name] Primary Care Sonia Stickels: [name] Other Clinician: Referring Emauri Krygier: Treating Nelton Amsden/Extender: [name] in Treatment: 5 Edker, [place] ([id number]) [id number] Page 3 of 8 Encounter Discharge Information Items Post Procedure Vitals Discharge Condition: Stable Temperature (F): 98.1 Ambulatory Status: Ambulatory Pulse (bpm): 63 Discharge Destination: Home Respiratory Rate (breaths/min): 18 Transportation: Private Auto Blood Pressure (mmHg): 163/85 Accompanied By: self Schedule Follow-up Appointment: Yes Clinical Summary of Care: Electronic Signature(s) Signed: 04/10/2023 4:15:38 PM By: Yevonne Pax RN Entered By: Yevonne Pax on 04/10/2023 08:22:35 -------------------------------------------------------------------------------- Lower Extremity Assessment Details Patient Name: Date of Service: Harold Lovely HN M. 04/10/2023 8:00 A M Medical Record Number: [id number] Patient Account Number: [id number] Date of Birth/Sex: Treating RN: Mar 22, 1961 (62 y.o. [name]) Yevonne Pax Primary Care Trude Cansler: [name] Other Clinician: Referring Safir Michalec: Treating Armya Westerhoff/Extender: [name] in Treatment: 5 Electronic Signature(s) Signed: 04/10/2023 4:15:38 PM By: Yevonne Pax RN Entered By: Yevonne Pax on 04/10/2023 08:11:31 -------------------------------------------------------------------------------- Multi Wound Chart Details Patient Name: Date of Service: Harold Lovely HN M. 04/10/2023 8:00 A M Medical Record Number: [id number] Patient Account Number: [id number] Date of Birth/Sex: Treating RN: 04-17-1961 (62 y.o. 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 N/A N/A Comorbid History: 01/02/2023 N/A N/A Date Acquired: 5 N/A N/A Weeks of Treatment: Open N/A N/A Wound Status: No N/A N/A Wound Recurrence: 0.2x0.2x0.2 N/A N/A Measurements L x W x D (cm) 0.031 N/A N/A A (cm) : rea 0.006 N/A N/A Volume (cm) : 0.00% N/A N/A % Reduction in A rea: 0.00% N/A N/A % Reduction in Volume: Grade 1 N/A N/A Classification: Medium N/A N/A Exudate A mount: Serosanguineous N/A N/A Exudate Type: red, brown N/A N/A Exudate Color: Large (67-100%) N/A N/A Granulation A mount: Red N/A N/A Granulation Quality: None Present (0%) N/A N/A Necrotic A mount: Fat Layer (Subcutaneous Tissue): Yes N/A N/A Exposed Structures: Fascia: No Tendon: No Muscle: No Joint: No Bone: No Small (1-33%) N/A N/A Epithelialization: Treatment Notes Electronic Signature(s) Signed: 04/10/2023 4:15:38 PM By: Yevonne Pax  RN Entered By: Yevonne Pax on 04/10/2023 08:11:35 -------------------------------------------------------------------------------- Multi-Disciplinary Care Plan Details Patient Name: Date of Service: Harold Lovely HN M. 04/10/2023 8:00 A M Medical Record Number: [id number] Patient Account Number: [id number] Date of Birth/Sex: Treating RN: 1960-12-31 (62 y.o. [name] Primary Care Yarlin Breisch: [name] Other Clinician: Referring Taraneh Metheney: Treating Montgomery Rothlisberger/Extender: [name] in Treatment: 5 Active Inactive Wound/Skin Impairment Nursing Diagnoses: Impaired tissue integrity Knowledge deficit related to ulceration/compromised skin integrity Goals: [name], [name] ([id number]) [id number] Page 5 of 8 Patient/caregiver will verbalize understanding of skin care regimen Date Initiated: 03/04/2023 Target Resolution Date: 05/10/2023 Goal Status: Active Ulcer/skin breakdown will  been seen at the hospital within the last three years: Yes Total Score: 0 Level Of Care: ____ Electronic Signature(s) Signed: 04/10/2023 4:15:38 PM By: Yevonne Pax RN Entered By: Yevonne Pax on 04/10/2023 08:21:38 -------------------------------------------------------------------------------- Encounter Discharge Information Details Patient Name: Date of Service: Harold Lovely HN M. 04/10/2023 8:00 A M Medical Record Number: [id number] Patient Account Number: [id number] Date of Birth/Sex: Treating RN: 07/22/1960 (62 y.o. [name] Primary Care Sonia Stickels: [name] Other Clinician: Referring Emauri Krygier: Treating Nelton Amsden/Extender: [name] in Treatment: 5 Edker, [place] ([id number]) [id number].pdf Page 3 of 8 Encounter Discharge Information Items Post Procedure Vitals Discharge Condition: Stable Temperature (F): 98.1 Ambulatory Status: Ambulatory Pulse (bpm): 63 Discharge Destination: Home Respiratory Rate (breaths/min): 18 Transportation: Private Auto Blood Pressure (mmHg): 163/85 Accompanied By: self Schedule Follow-up Appointment: Yes Clinical Summary of Care: Electronic Signature(s) Signed: 04/10/2023 4:15:38 PM By: Yevonne Pax RN Entered By: Yevonne Pax on 04/10/2023 08:22:35 -------------------------------------------------------------------------------- Lower Extremity Assessment Details Patient Name: Date of Service: Harold Lovely HN M. 04/10/2023 8:00 A M Medical Record Number: [id number] Patient Account Number: [id number] Date of Birth/Sex: Treating RN: Mar 22, 1961 (62 y.o. [name]) Yevonne Pax Primary Care Trude Cansler: [name] Other Clinician: Referring Safir Michalec: Treating Armya Westerhoff/Extender: [name] in Treatment: 5 Electronic Signature(s) Signed: 04/10/2023 4:15:38 PM By: Yevonne Pax RN Entered By: Yevonne Pax on 04/10/2023 08:11:31 -------------------------------------------------------------------------------- Multi Wound Chart Details Patient Name: Date of Service: Harold Lovely HN M. 04/10/2023 8:00 A M Medical Record Number: [id number] Patient Account Number: [id number] Date of Birth/Sex: Treating RN: 04-17-1961 (62 y.o. 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 N/A N/A Comorbid History: 01/02/2023 N/A N/A Date Acquired: 5 N/A N/A Weeks of Treatment: Open N/A N/A Wound Status: No N/A N/A Wound Recurrence: 0.2x0.2x0.2 N/A N/A Measurements L x W x D (cm) 0.031 N/A N/A A (cm) : rea 0.006 N/A N/A Volume (cm) : 0.00% N/A N/A % Reduction in A rea: 0.00% N/A N/A % Reduction in Volume: Grade 1 N/A N/A Classification: Medium N/A N/A Exudate A mount: Serosanguineous N/A N/A Exudate Type: red, brown N/A N/A Exudate Color: Large (67-100%) N/A N/A Granulation A mount: Red N/A N/A Granulation Quality: None Present (0%) N/A N/A Necrotic A mount: Fat Layer (Subcutaneous Tissue): Yes N/A N/A Exposed Structures: Fascia: No Tendon: No Muscle: No Joint: No Bone: No Small (1-33%) N/A N/A Epithelialization: Treatment Notes Electronic Signature(s) Signed: 04/10/2023 4:15:38 PM By: Yevonne Pax  RN Entered By: Yevonne Pax on 04/10/2023 08:11:35 -------------------------------------------------------------------------------- Multi-Disciplinary Care Plan Details Patient Name: Date of Service: Harold Lovely HN M. 04/10/2023 8:00 A M Medical Record Number: [id number] Patient Account Number: [id number] Date of Birth/Sex: Treating RN: 1960-12-31 (62 y.o. [name] Primary Care Yarlin Breisch: [name] Other Clinician: Referring Taraneh Metheney: Treating Montgomery Rothlisberger/Extender: [name] in Treatment: 5 Active Inactive Wound/Skin Impairment Nursing Diagnoses: Impaired tissue integrity Knowledge deficit related to ulceration/compromised skin integrity Goals: [name], [name] ([id number]) [id number].pdf Page 5 of 8 Patient/caregiver will verbalize understanding of skin care regimen Date Initiated: 03/04/2023 Target Resolution Date: 05/10/2023 Goal Status: Active Ulcer/skin breakdown will  Foam, 2.5x2.5 (in/in) Discharge Instruction: Apply Hydrofera Blue Ready to wound bed as directed Secondary  Dressing (BORDER) Zetuvit Plus SILICONE BORDER Dressing 4x4 (in/in) Discharge Instruction: Please do not put silicone bordered dressings under wraps. Use non-bordered dressing only. Secured With Compression Wrap Compression Stockings Facilities manager) Signed: 04/10/2023 4:15:38 PM By: Yevonne Pax RN Entered By: Yevonne Pax on 04/10/2023 08:11:06 -------------------------------------------------------------------------------- Vitals Details Patient Name: Date of Service: Harold Lovely HN M. 04/10/2023 8:00 A M Medical Record Number: [id number] Patient Account Number: [id number] Date of Birth/Sex: Treating RN: 1961-04-22 (62 y.o. [name]) Yevonne Pax Primary Care Chesnie Capell: [name] Other Clinician: Referring Garritt Molyneux: Treating Jolina Symonds/Extender: [name] in Treatment: 5 Vital Signs Time Taken: 08:07 Temperature (F): 98.1 Height (in): 68 Pulse (bpm): 63 Weight (lbs): 209 Respiratory Rate (breaths/min): 18 Body Mass Index (BMI): 31.8 Blood Pressure (mmHg): 163/85 Reference Range: 80 - 120 mg / dl Electronic Signature(s) Signed: 04/10/2023 4:15:38 PM By: Yevonne Pax RN Entered By: Yevonne Pax on 04/10/2023 08:08:01

## 2023-04-10 NOTE — Progress Notes (Addendum)
is Open. Original cause of wound was Pressure Injury. The date acquired was: 01/02/2023. The wound has been in treatment 5 weeks. The wound is located on the Right,Plantar Foot. The wound measures 0.2cm length x 0.2cm width x 0.2cm depth; 0.031cm^2 area and 0.006cm^3 volume. There is Fat Layer (Subcutaneous Tissue) exposed. There is no tunneling or undermining noted. There is a medium amount of serosanguineous drainage noted. There is large (67-100%) red granulation within the wound bed. There is no necrotic tissue within the wound bed. Assessment Active Problems ICD-10 Type 2 diabetes mellitus with foot ulcer Non-pressure chronic ulcer of other part of right foot with fat layer exposed Other specified peripheral vascular diseases Chronic kidney disease, stage 4 (severe) Essential (primary) hypertension Procedures Wound #2 Pre-procedure diagnosis of Wound #2 is a Diabetic Wound/Ulcer of the Lower Extremity located on the Right,Plantar Foot .Severity of Tissue Pre Debridement is: Fat layer exposed. There was a Excisional Skin/Subcutaneous Tissue Debridement with a total area of 0.03 sq cm performed by Harold Derry, PA-C. With the following instrument(s): Curette to remove Viable and Non-Viable tissue/material. Material removed includes Callus, Subcutaneous Tissue, Slough, Skin: Dermis, and Skin: Epidermis. A time out was conducted at 08:15, prior to the start of the procedure. A Minimum amount of bleeding was controlled with N/A. The procedure was tolerated well with a pain level of 0 throughout and a pain level of 0 following the procedure. Post Debridement Measurements: 0.2cm  length x 0.2cm width x 0.2cm depth; 0.006cm^3 volume. Character of Wound/Ulcer Post Debridement is improved. Severity of Tissue Post Debridement is: Fat layer exposed. Post procedure Diagnosis Wound #2: Same as Pre-Procedure [name] ([id number]) [phone] Page 8 of 9 Plan Follow-up Appointments: Return Appointment in 1 week. Bathing/ Shower/ Hygiene: May shower; gently cleanse wound with antibacterial soap, rinse and pat dry prior to dressing wounds Anesthetic (Use 'Patient Medications' Section for Anesthetic Order Entry): Lidocaine applied to wound bed Off-Loading: Open toe surgical shoe with peg assist. WOUND #2: - Foot Wound Laterality: Plantar, Right Cleanser: Soap and Water 3 x Per Week/30 Days Discharge Instructions: Gently cleanse wound with antibacterial soap, rinse and pat dry prior to dressing wounds Prim Dressing: Hydrofera Blue Ready Transfer Foam, 2.5x2.5 (in/in) (Dispense As Written) 3 x Per Week/30 Days ary Discharge Instructions: Apply Hydrofera Blue Ready to wound bed as directed Secondary Dressing: (BORDER) Zetuvit Plus SILICONE BORDER Dressing 4x4 (in/in) (Generic) 3 x Per Week/30 Days Discharge Instructions: Please do not put silicone bordered dressings under wraps. Use non-bordered dressing only. 1. Based on what I am seeing I do believe that the patient is making really good headway towards closure I am pleased and we can recommend continuing with therapy currently towards try to get this completely closed. 2. I am going to recommend as well that the patient should continue to monitor for any signs of infection or worsening if anything changes he knows contact the office and let me know. We will see patient back for reevaluation in 1 week here in the clinic. If anything worsens or changes patient will contact our office for additional recommendations. Electronic Signature(s) Signed: 04/10/2023 8:56:10 AM By: Harold Derry  PA-C Entered By: [name] on 04/10/2023 05:56:09 -------------------------------------------------------------------------------- SuperBill Details Patient Name: Date of Service: Harold Mould M. 04/10/2023 Medical Record Number: [id number] Patient Account Number: [id number] Date of Birth/Sex: Treating RN: [birth year] (62 y.o. [name] Primary Care Provider: Saralyn [name] Other Clinician: Referring Provider: Treating Provider/Extender: [name] in Treatment:  [id number] Date of Birth/Sex: Treating RN: 1961/01/19 (62 y.o. [name]) Yevonne Pax Primary Care Provider: Saralyn [name] Other Clinician: Referring Provider: Treating Provider/Extender: [name] in Treatment: 5 Verbal / Phone Orders: No Diagnosis Coding ICD-10 Coding Code Description E11.621 Type 2 diabetes mellitus with foot ulcer L97.512 Non-pressure chronic ulcer of other part of right foot with fat layer exposed I73.89 Other specified peripheral vascular diseases N18.4 Chronic kidney disease, stage 4 (severe) I10 Essential (primary) hypertension Follow-up Appointments Return Appointment in 1 week. Bathing/  Shower/ Hygiene May shower; gently cleanse wound with antibacterial soap, rinse and pat dry prior to dressing wounds Anesthetic (Use 'Patient Medications' Section for Anesthetic Order Entry) Lidocaine applied to wound bed Off-Loading Open toe surgical shoe with peg assist. Wound Treatment Wound #2 - Foot Wound Laterality: Plantar, Right Cleanser: Soap and Water 3 x Per Week/30 Days Discharge Instructions: Gently cleanse wound with antibacterial soap, rinse and pat dry prior to dressing wounds Prim Dressing: Hydrofera Blue Ready Transfer Foam, 2.5x2.5 (in/in) (Dispense As Written) 3 x Per Week/30 Days ary Discharge Instructions: Apply Hydrofera Blue Ready to wound bed as directed Secondary Dressing: (BORDER) Zetuvit Plus SILICONE BORDER Dressing 4x4 (in/in) (Generic) 3 x Per Week/30 Days Discharge Instructions: Please do not put silicone bordered dressings under wraps. Use non-bordered dressing only. Electronic Signature(s) [name] [place] ([id number]) [id number] Page 5 of 9 Signed: 04/10/2023 4:15:38 PM By: Yevonne Pax RN Signed: 04/11/2023 2:21:22 PM By: Harold Derry PA-C Entered By: Yevonne Pax on 04/10/2023 05:21:30 -------------------------------------------------------------------------------- Problem List Details Patient Name: Date of Service: Harold Lovely HN M. 04/10/2023 8:00 A M Medical Record Number: [id number] Patient Account Number: [id number] Date of Birth/Sex: Treating RN: 01/24/1961 (62 y.o. [name] Primary Care Provider: Saralyn [name] Other Clinician: Referring Provider: Treating Provider/Extender: [name] in Treatment: 5 Active Problems ICD-10 Encounter Code Description Active Date MDM Diagnosis E11.621 Type 2 diabetes mellitus with foot ulcer 03/04/2023 No Yes L97.512 Non-pressure chronic ulcer of other part of right foot with fat layer exposed 03/04/2023 No Yes I73.89 Other specified  peripheral vascular diseases 03/04/2023 No Yes N18.4 Chronic kidney disease, stage 4 (severe) 03/04/2023 No Yes I10 Essential (primary) hypertension 03/04/2023 No Yes Inactive Problems Resolved Problems Electronic Signature(s) Signed: 04/10/2023 8:15:01 AM By: Harold Derry PA-C Entered By: [name] on 04/10/2023 05:15:01 [name] ([id number]) [id number] Page 6 of 9 -------------------------------------------------------------------------------- Progress Note Details Patient Name: Date of Service: Harold Mould M. 04/10/2023 8:00 A M Medical Record Number: [id number] Patient Account Number: [id number] Date of Birth/Sex: Treating RN: 31-Jul-1960 (62 y.o. [name]) Yevonne Pax Primary Care Provider: Saralyn [name] Other Clinician: Referring Provider: Treating Provider/Extender: [name] in Treatment: 5 Subjective Chief Complaint Information obtained from Patient Right plantar foot ulcer History of Present Illness (HPI) Chronic/Inactive Condition: 03-04-2023 patient's arterial screening today actually appears to show some signs of being somewhat low at 0.83 on the left 1.53 on the right which is essentially noncompressible. I really do not think we can assure ourselves of these readings and I think he really should see vascular for a thorough arterial evaluation in order to ensure that he has good blood flow although I suspect he probably does. 07/20/17 on evaluation today patient actually appears to be doing somewhat poorly in regard to his right plantar great toe ulcer. This is something that he states started as what he thought to be a blister May 04, 2018. Subsequently he treated this on his own for  [id number] Date of Birth/Sex: Treating RN: 1961/01/19 (62 y.o. [name]) Yevonne Pax Primary Care Provider: Saralyn [name] Other Clinician: Referring Provider: Treating Provider/Extender: [name] in Treatment: 5 Verbal / Phone Orders: No Diagnosis Coding ICD-10 Coding Code Description E11.621 Type 2 diabetes mellitus with foot ulcer L97.512 Non-pressure chronic ulcer of other part of right foot with fat layer exposed I73.89 Other specified peripheral vascular diseases N18.4 Chronic kidney disease, stage 4 (severe) I10 Essential (primary) hypertension Follow-up Appointments Return Appointment in 1 week. Bathing/  Shower/ Hygiene May shower; gently cleanse wound with antibacterial soap, rinse and pat dry prior to dressing wounds Anesthetic (Use 'Patient Medications' Section for Anesthetic Order Entry) Lidocaine applied to wound bed Off-Loading Open toe surgical shoe with peg assist. Wound Treatment Wound #2 - Foot Wound Laterality: Plantar, Right Cleanser: Soap and Water 3 x Per Week/30 Days Discharge Instructions: Gently cleanse wound with antibacterial soap, rinse and pat dry prior to dressing wounds Prim Dressing: Hydrofera Blue Ready Transfer Foam, 2.5x2.5 (in/in) (Dispense As Written) 3 x Per Week/30 Days ary Discharge Instructions: Apply Hydrofera Blue Ready to wound bed as directed Secondary Dressing: (BORDER) Zetuvit Plus SILICONE BORDER Dressing 4x4 (in/in) (Generic) 3 x Per Week/30 Days Discharge Instructions: Please do not put silicone bordered dressings under wraps. Use non-bordered dressing only. Electronic Signature(s) [name] [place] ([id number]) [id number] Page 5 of 9 Signed: 04/10/2023 4:15:38 PM By: Yevonne Pax RN Signed: 04/11/2023 2:21:22 PM By: Harold Derry PA-C Entered By: Yevonne Pax on 04/10/2023 05:21:30 -------------------------------------------------------------------------------- Problem List Details Patient Name: Date of Service: Harold Lovely HN M. 04/10/2023 8:00 A M Medical Record Number: [id number] Patient Account Number: [id number] Date of Birth/Sex: Treating RN: 01/24/1961 (62 y.o. [name] Primary Care Provider: Saralyn [name] Other Clinician: Referring Provider: Treating Provider/Extender: [name] in Treatment: 5 Active Problems ICD-10 Encounter Code Description Active Date MDM Diagnosis E11.621 Type 2 diabetes mellitus with foot ulcer 03/04/2023 No Yes L97.512 Non-pressure chronic ulcer of other part of right foot with fat layer exposed 03/04/2023 No Yes I73.89 Other specified  peripheral vascular diseases 03/04/2023 No Yes N18.4 Chronic kidney disease, stage 4 (severe) 03/04/2023 No Yes I10 Essential (primary) hypertension 03/04/2023 No Yes Inactive Problems Resolved Problems Electronic Signature(s) Signed: 04/10/2023 8:15:01 AM By: Harold Derry PA-C Entered By: [name] on 04/10/2023 05:15:01 [name] ([id number]) [id number] Page 6 of 9 -------------------------------------------------------------------------------- Progress Note Details Patient Name: Date of Service: Harold Mould M. 04/10/2023 8:00 A M Medical Record Number: [id number] Patient Account Number: [id number] Date of Birth/Sex: Treating RN: 31-Jul-1960 (62 y.o. [name]) Yevonne Pax Primary Care Provider: Saralyn [name] Other Clinician: Referring Provider: Treating Provider/Extender: [name] in Treatment: 5 Subjective Chief Complaint Information obtained from Patient Right plantar foot ulcer History of Present Illness (HPI) Chronic/Inactive Condition: 03-04-2023 patient's arterial screening today actually appears to show some signs of being somewhat low at 0.83 on the left 1.53 on the right which is essentially noncompressible. I really do not think we can assure ourselves of these readings and I think he really should see vascular for a thorough arterial evaluation in order to ensure that he has good blood flow although I suspect he probably does. 07/20/17 on evaluation today patient actually appears to be doing somewhat poorly in regard to his right plantar great toe ulcer. This is something that he states started as what he thought to be a blister May 04, 2018. Subsequently he treated this on his own for  [id number] Date of Birth/Sex: Treating RN: 1961/01/19 (62 y.o. [name]) Yevonne Pax Primary Care Provider: Saralyn [name] Other Clinician: Referring Provider: Treating Provider/Extender: [name] in Treatment: 5 Verbal / Phone Orders: No Diagnosis Coding ICD-10 Coding Code Description E11.621 Type 2 diabetes mellitus with foot ulcer L97.512 Non-pressure chronic ulcer of other part of right foot with fat layer exposed I73.89 Other specified peripheral vascular diseases N18.4 Chronic kidney disease, stage 4 (severe) I10 Essential (primary) hypertension Follow-up Appointments Return Appointment in 1 week. Bathing/  Shower/ Hygiene May shower; gently cleanse wound with antibacterial soap, rinse and pat dry prior to dressing wounds Anesthetic (Use 'Patient Medications' Section for Anesthetic Order Entry) Lidocaine applied to wound bed Off-Loading Open toe surgical shoe with peg assist. Wound Treatment Wound #2 - Foot Wound Laterality: Plantar, Right Cleanser: Soap and Water 3 x Per Week/30 Days Discharge Instructions: Gently cleanse wound with antibacterial soap, rinse and pat dry prior to dressing wounds Prim Dressing: Hydrofera Blue Ready Transfer Foam, 2.5x2.5 (in/in) (Dispense As Written) 3 x Per Week/30 Days ary Discharge Instructions: Apply Hydrofera Blue Ready to wound bed as directed Secondary Dressing: (BORDER) Zetuvit Plus SILICONE BORDER Dressing 4x4 (in/in) (Generic) 3 x Per Week/30 Days Discharge Instructions: Please do not put silicone bordered dressings under wraps. Use non-bordered dressing only. Electronic Signature(s) [name] [place] ([id number]) [id number] Page 5 of 9 Signed: 04/10/2023 4:15:38 PM By: Yevonne Pax RN Signed: 04/11/2023 2:21:22 PM By: Harold Derry PA-C Entered By: Yevonne Pax on 04/10/2023 05:21:30 -------------------------------------------------------------------------------- Problem List Details Patient Name: Date of Service: Harold Lovely HN M. 04/10/2023 8:00 A M Medical Record Number: [id number] Patient Account Number: [id number] Date of Birth/Sex: Treating RN: 01/24/1961 (62 y.o. [name] Primary Care Provider: Saralyn [name] Other Clinician: Referring Provider: Treating Provider/Extender: [name] in Treatment: 5 Active Problems ICD-10 Encounter Code Description Active Date MDM Diagnosis E11.621 Type 2 diabetes mellitus with foot ulcer 03/04/2023 No Yes L97.512 Non-pressure chronic ulcer of other part of right foot with fat layer exposed 03/04/2023 No Yes I73.89 Other specified  peripheral vascular diseases 03/04/2023 No Yes N18.4 Chronic kidney disease, stage 4 (severe) 03/04/2023 No Yes I10 Essential (primary) hypertension 03/04/2023 No Yes Inactive Problems Resolved Problems Electronic Signature(s) Signed: 04/10/2023 8:15:01 AM By: Harold Derry PA-C Entered By: [name] on 04/10/2023 05:15:01 [name] ([id number]) [id number] Page 6 of 9 -------------------------------------------------------------------------------- Progress Note Details Patient Name: Date of Service: Harold Mould M. 04/10/2023 8:00 A M Medical Record Number: [id number] Patient Account Number: [id number] Date of Birth/Sex: Treating RN: 31-Jul-1960 (62 y.o. [name]) Yevonne Pax Primary Care Provider: Saralyn [name] Other Clinician: Referring Provider: Treating Provider/Extender: [name] in Treatment: 5 Subjective Chief Complaint Information obtained from Patient Right plantar foot ulcer History of Present Illness (HPI) Chronic/Inactive Condition: 03-04-2023 patient's arterial screening today actually appears to show some signs of being somewhat low at 0.83 on the left 1.53 on the right which is essentially noncompressible. I really do not think we can assure ourselves of these readings and I think he really should see vascular for a thorough arterial evaluation in order to ensure that he has good blood flow although I suspect he probably does. 07/20/17 on evaluation today patient actually appears to be doing somewhat poorly in regard to his right plantar great toe ulcer. This is something that he states started as what he thought to be a blister May 04, 2018. Subsequently he treated this on his own for  [id number] Date of Birth/Sex: Treating RN: 1961/01/19 (62 y.o. [name]) Yevonne Pax Primary Care Provider: Saralyn [name] Other Clinician: Referring Provider: Treating Provider/Extender: [name] in Treatment: 5 Verbal / Phone Orders: No Diagnosis Coding ICD-10 Coding Code Description E11.621 Type 2 diabetes mellitus with foot ulcer L97.512 Non-pressure chronic ulcer of other part of right foot with fat layer exposed I73.89 Other specified peripheral vascular diseases N18.4 Chronic kidney disease, stage 4 (severe) I10 Essential (primary) hypertension Follow-up Appointments Return Appointment in 1 week. Bathing/  Shower/ Hygiene May shower; gently cleanse wound with antibacterial soap, rinse and pat dry prior to dressing wounds Anesthetic (Use 'Patient Medications' Section for Anesthetic Order Entry) Lidocaine applied to wound bed Off-Loading Open toe surgical shoe with peg assist. Wound Treatment Wound #2 - Foot Wound Laterality: Plantar, Right Cleanser: Soap and Water 3 x Per Week/30 Days Discharge Instructions: Gently cleanse wound with antibacterial soap, rinse and pat dry prior to dressing wounds Prim Dressing: Hydrofera Blue Ready Transfer Foam, 2.5x2.5 (in/in) (Dispense As Written) 3 x Per Week/30 Days ary Discharge Instructions: Apply Hydrofera Blue Ready to wound bed as directed Secondary Dressing: (BORDER) Zetuvit Plus SILICONE BORDER Dressing 4x4 (in/in) (Generic) 3 x Per Week/30 Days Discharge Instructions: Please do not put silicone bordered dressings under wraps. Use non-bordered dressing only. Electronic Signature(s) [name] [place] ([id number]) [id number] Page 5 of 9 Signed: 04/10/2023 4:15:38 PM By: Yevonne Pax RN Signed: 04/11/2023 2:21:22 PM By: Harold Derry PA-C Entered By: Yevonne Pax on 04/10/2023 05:21:30 -------------------------------------------------------------------------------- Problem List Details Patient Name: Date of Service: Harold Lovely HN M. 04/10/2023 8:00 A M Medical Record Number: [id number] Patient Account Number: [id number] Date of Birth/Sex: Treating RN: 01/24/1961 (62 y.o. [name] Primary Care Provider: Saralyn [name] Other Clinician: Referring Provider: Treating Provider/Extender: [name] in Treatment: 5 Active Problems ICD-10 Encounter Code Description Active Date MDM Diagnosis E11.621 Type 2 diabetes mellitus with foot ulcer 03/04/2023 No Yes L97.512 Non-pressure chronic ulcer of other part of right foot with fat layer exposed 03/04/2023 No Yes I73.89 Other specified  peripheral vascular diseases 03/04/2023 No Yes N18.4 Chronic kidney disease, stage 4 (severe) 03/04/2023 No Yes I10 Essential (primary) hypertension 03/04/2023 No Yes Inactive Problems Resolved Problems Electronic Signature(s) Signed: 04/10/2023 8:15:01 AM By: Harold Derry PA-C Entered By: [name] on 04/10/2023 05:15:01 [name] ([id number]) [id number] Page 6 of 9 -------------------------------------------------------------------------------- Progress Note Details Patient Name: Date of Service: Harold Mould M. 04/10/2023 8:00 A M Medical Record Number: [id number] Patient Account Number: [id number] Date of Birth/Sex: Treating RN: 31-Jul-1960 (62 y.o. [name]) Yevonne Pax Primary Care Provider: Saralyn [name] Other Clinician: Referring Provider: Treating Provider/Extender: [name] in Treatment: 5 Subjective Chief Complaint Information obtained from Patient Right plantar foot ulcer History of Present Illness (HPI) Chronic/Inactive Condition: 03-04-2023 patient's arterial screening today actually appears to show some signs of being somewhat low at 0.83 on the left 1.53 on the right which is essentially noncompressible. I really do not think we can assure ourselves of these readings and I think he really should see vascular for a thorough arterial evaluation in order to ensure that he has good blood flow although I suspect he probably does. 07/20/17 on evaluation today patient actually appears to be doing somewhat poorly in regard to his right plantar great toe ulcer. This is something that he states started as what he thought to be a blister May 04, 2018. Subsequently he treated this on his own for  VIRLAN, KEMPKER [place] ([id number]) [id number] Page 1 of 9 Visit Report for 04/10/2023 Chief Complaint Document Details Patient Name: Date of Service: Harold Lovely [facility] M. 04/10/2023 8:00 A M Medical Record Number: [id number] Patient Account Number: [id number] Date of Birth/Sex: Treating RN: August 05, 1960 (62 y.o. [name]) Yevonne Pax Primary Care Provider: Saralyn [name] Other Clinician: Referring Provider: Treating Provider/Extender: [name] in Treatment: 5 Information Obtained from: Patient Chief Complaint Right plantar foot ulcer Electronic Signature(s) Signed: 04/10/2023 8:15:08 AM By: Harold Derry PA-C Entered By: [name] on 04/10/2023 05:15:08 -------------------------------------------------------------------------------- Debridement Details Patient Name: Date of Service: Harold Lovely HN M. 04/10/2023 8:00 A M Medical Record Number: [id number] Patient Account Number: [id number] Date of Birth/Sex: Treating RN: 1960-09-14 (62 y.o. [name] Primary Care Provider: Saralyn [name] Other Clinician: Referring Provider: Treating Provider/Extender: [name] in Treatment: 5 Debridement Performed for Assessment: Wound #2 Right,Plantar Foot Performed By: Physician Harold Derry, PA-C Debridement Type: Debridement Severity of Tissue Pre Debridement: Fat layer exposed Level of Consciousness (Pre-procedure): Awake and Alert Pre-procedure Verification/Time Out Yes - 08:15 Taken: Start Time: 08:15 Percent of Wound Bed Debrided: 100% T Area Debrided (cm): otal 0.03 Tissue and other material debrided: Viable, Non-Viable, Callus, Slough, Subcutaneous, Skin: Dermis , Skin: Epidermis, Slough Level: Skin/Subcutaneous Tissue Debridement Description: Excisional Instrument: Curette Bleeding: Minimum End Time: 08:22 Procedural Pain: 0 Post Procedural Pain: 0 Response to Treatment:  Procedure was tolerated well [name] ([id number]) [id number] Page 2 of 9 Level of Consciousness (Post- Awake and Alert procedure): Post Debridement Measurements of Total Wound Length: (cm) 0.2 Width: (cm) 0.2 Depth: (cm) 0.2 Volume: (cm) 0.006 Character of Wound/Ulcer Post Debridement: Improved Severity of Tissue Post Debridement: Fat layer exposed Post Procedure Diagnosis Same as Pre-procedure Electronic Signature(s) Signed: 04/10/2023 4:15:38 PM By: Yevonne Pax RN Signed: 04/11/2023 2:21:22 PM By: Harold Derry PA-C Entered By: Yevonne Pax on 04/10/2023 05:21:06 -------------------------------------------------------------------------------- HPI Details Patient Name: Date of Service: Harold Lovely HN M. 04/10/2023 8:00 A M Medical Record Number: [id number] Patient Account Number: [id number] Date of Birth/Sex: Treating RN: 09/28/1960 (62 y.o. [name] Primary Care Provider: Saralyn [name] Other Clinician: Referring Provider: Treating Provider/Extender: [name] in Treatment: 5 History of Present Illness Chronic/Inactive Conditions Condition 1: 03-04-2023 patient's arterial screening today actually appears to show some signs of being somewhat low at 0.83 on the left 1.53 on the right which is essentially noncompressible. I really do not think we can assure ourselves of these readings and I think he really should see vascular for a thorough arterial evaluation in order to ensure that he has good blood flow although I suspect he probably does. HPI Description: 07/20/17 on evaluation today patient actually appears to be doing somewhat poorly in regard to his right plantar great toe ulcer. This is something that he states started as what he thought to be a blister May 04, 2018. Subsequently he treated this on his own for a while thinking that it would just get better. As it did not get better he  eventually did go to have this evaluated where it was treated appropriately with it sounds like Bactroban ointment however the patient states that it never completely resolved. Subsequently if this continue to get worse and was looking more irritated and inflamed he did go to the hospital more recently where he was prescribed Keflex. Subsequently he was also referred to the wound care center here for further evaluation by [place]. He had an  VIRLAN, KEMPKER [place] ([id number]) [id number] Page 1 of 9 Visit Report for 04/10/2023 Chief Complaint Document Details Patient Name: Date of Service: Harold Lovely [facility] M. 04/10/2023 8:00 A M Medical Record Number: [id number] Patient Account Number: [id number] Date of Birth/Sex: Treating RN: August 05, 1960 (62 y.o. [name]) Yevonne Pax Primary Care Provider: Saralyn [name] Other Clinician: Referring Provider: Treating Provider/Extender: [name] in Treatment: 5 Information Obtained from: Patient Chief Complaint Right plantar foot ulcer Electronic Signature(s) Signed: 04/10/2023 8:15:08 AM By: Harold Derry PA-C Entered By: [name] on 04/10/2023 05:15:08 -------------------------------------------------------------------------------- Debridement Details Patient Name: Date of Service: Harold Lovely HN M. 04/10/2023 8:00 A M Medical Record Number: [id number] Patient Account Number: [id number] Date of Birth/Sex: Treating RN: 1960-09-14 (62 y.o. [name] Primary Care Provider: Saralyn [name] Other Clinician: Referring Provider: Treating Provider/Extender: [name] in Treatment: 5 Debridement Performed for Assessment: Wound #2 Right,Plantar Foot Performed By: Physician Harold Derry, PA-C Debridement Type: Debridement Severity of Tissue Pre Debridement: Fat layer exposed Level of Consciousness (Pre-procedure): Awake and Alert Pre-procedure Verification/Time Out Yes - 08:15 Taken: Start Time: 08:15 Percent of Wound Bed Debrided: 100% T Area Debrided (cm): otal 0.03 Tissue and other material debrided: Viable, Non-Viable, Callus, Slough, Subcutaneous, Skin: Dermis , Skin: Epidermis, Slough Level: Skin/Subcutaneous Tissue Debridement Description: Excisional Instrument: Curette Bleeding: Minimum End Time: 08:22 Procedural Pain: 0 Post Procedural Pain: 0 Response to Treatment:  Procedure was tolerated well [name] ([id number]) [id number] Page 2 of 9 Level of Consciousness (Post- Awake and Alert procedure): Post Debridement Measurements of Total Wound Length: (cm) 0.2 Width: (cm) 0.2 Depth: (cm) 0.2 Volume: (cm) 0.006 Character of Wound/Ulcer Post Debridement: Improved Severity of Tissue Post Debridement: Fat layer exposed Post Procedure Diagnosis Same as Pre-procedure Electronic Signature(s) Signed: 04/10/2023 4:15:38 PM By: Yevonne Pax RN Signed: 04/11/2023 2:21:22 PM By: Harold Derry PA-C Entered By: Yevonne Pax on 04/10/2023 05:21:06 -------------------------------------------------------------------------------- HPI Details Patient Name: Date of Service: Harold Lovely HN M. 04/10/2023 8:00 A M Medical Record Number: [id number] Patient Account Number: [id number] Date of Birth/Sex: Treating RN: 09/28/1960 (62 y.o. [name] Primary Care Provider: Saralyn [name] Other Clinician: Referring Provider: Treating Provider/Extender: [name] in Treatment: 5 History of Present Illness Chronic/Inactive Conditions Condition 1: 03-04-2023 patient's arterial screening today actually appears to show some signs of being somewhat low at 0.83 on the left 1.53 on the right which is essentially noncompressible. I really do not think we can assure ourselves of these readings and I think he really should see vascular for a thorough arterial evaluation in order to ensure that he has good blood flow although I suspect he probably does. HPI Description: 07/20/17 on evaluation today patient actually appears to be doing somewhat poorly in regard to his right plantar great toe ulcer. This is something that he states started as what he thought to be a blister May 04, 2018. Subsequently he treated this on his own for a while thinking that it would just get better. As it did not get better he  eventually did go to have this evaluated where it was treated appropriately with it sounds like Bactroban ointment however the patient states that it never completely resolved. Subsequently if this continue to get worse and was looking more irritated and inflamed he did go to the hospital more recently where he was prescribed Keflex. Subsequently he was also referred to the wound care center here for further evaluation by [place]. He had an  been treating this just at home using some Betadine on the area but really nothing else has been done up to this point. This is actually his first office for his care is concerned in a clinic setting. Patient's past medical history actually is very similar to previous when I saw him. He does have a history of diabetes mellitus type 2, peripheral vascular disease, chronic kidney disease stage IV, hypertension, he has not had any formal arterial testing up to this point. Patient is likely going require an x-ray today. 8/27; patient was seen for the first time last week he has a wound over the metatarsal head on the right foot.  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 Allena Katz of podiatry. 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 He has been using Hydrofera Blue and sit to vet to the wound area. He had an x-ray done that showed faint sclerosis of the head of the second metatarsal which was nonspecific but new from 07/16/2018 which could be posttraumatic secondary to avascular necrosis or chronic infection 03-20-2023 upon evaluation today patient appears to be doing pretty well currently in regard to the plantar aspect of his foot. This is actually showing signs of improvement which is good news. So far I am seeing enough improvement and I am pretty pleased with where we are at although I did tell him that if he does not continue to see ongoing size improvements that we may want to consider a total contact cast. 03-27-2023 upon evaluation today patient appears to be doing well currently in regard to his wound. Has been tolerating the dressing changes without complication. Fortunately I do not see any evidence of worsening or infection overall. I am very pleased with where we stand today. 04-03-2023 upon evaluation today patient appears to be doing well currently in regard to his wound on the foot. Fortunately I do not see any signs of active infection locally or systemically at this time. With that being said the wound is getting very tiny and I think he is doing quite well. 04-10-2023 upon evaluation today patient appears to be doing well currently in regard to his wound. This is going require some debridement but does seem to be making progress towards closure which is good news. Electronic Signature(s) Signed: 04/10/2023 8:55:12 AM By: Harold Derry PA-C Entered By: [name] on 04/10/2023 05:55:12 -------------------------------------------------------------------------------- Physical Exam Details Patient Name: Date of Service: Harold Mould M. 04/10/2023  8:00 A M Medical Record Number: [id number] Patient Account Number: [id number] Date of Birth/Sex: Treating RN: [birth year] (62 y.o. [name] Primary Care Provider: Saralyn [name] Other Clinician: Referring Provider: Treating Provider/Extender: [name] in Treatment: 5 Constitutional Well-nourished and well-hydrated in no acute distress. [name], [name] [place] ([id number]) [id number] Page 4 of 9 Respiratory normal breathing without difficulty. Psychiatric this patient is able to make decisions and demonstrates good insight into disease process. Alert and Oriented x 3. pleasant and cooperative. Notes Upon inspection patient's wound did require sharp debridement and postdebridement wound bed actually appears to be doing much better which is great news in general I think that we are moving in the right direction here. Electronic Signature(s) Signed: 04/10/2023 8:55:49 AM By: Harold Derry PA-C Entered By: [name] on 04/10/2023 05:55:49 -------------------------------------------------------------------------------- Physician Orders Details Patient Name: Date of Service: Harold Lovely HN M. 04/10/2023 8:00 A M Medical Record Number: [id number] Patient Account Number:  [id number] Date of Birth/Sex: Treating RN: 1961/01/19 (62 y.o. [name]) Yevonne Pax Primary Care Provider: Saralyn [name] Other Clinician: Referring Provider: Treating Provider/Extender: [name] in Treatment: 5 Verbal / Phone Orders: No Diagnosis Coding ICD-10 Coding Code Description E11.621 Type 2 diabetes mellitus with foot ulcer L97.512 Non-pressure chronic ulcer of other part of right foot with fat layer exposed I73.89 Other specified peripheral vascular diseases N18.4 Chronic kidney disease, stage 4 (severe) I10 Essential (primary) hypertension Follow-up Appointments Return Appointment in 1 week. Bathing/  Shower/ Hygiene May shower; gently cleanse wound with antibacterial soap, rinse and pat dry prior to dressing wounds Anesthetic (Use 'Patient Medications' Section for Anesthetic Order Entry) Lidocaine applied to wound bed Off-Loading Open toe surgical shoe with peg assist. Wound Treatment Wound #2 - Foot Wound Laterality: Plantar, Right Cleanser: Soap and Water 3 x Per Week/30 Days Discharge Instructions: Gently cleanse wound with antibacterial soap, rinse and pat dry prior to dressing wounds Prim Dressing: Hydrofera Blue Ready Transfer Foam, 2.5x2.5 (in/in) (Dispense As Written) 3 x Per Week/30 Days ary Discharge Instructions: Apply Hydrofera Blue Ready to wound bed as directed Secondary Dressing: (BORDER) Zetuvit Plus SILICONE BORDER Dressing 4x4 (in/in) (Generic) 3 x Per Week/30 Days Discharge Instructions: Please do not put silicone bordered dressings under wraps. Use non-bordered dressing only. Electronic Signature(s) [name] [place] ([id number]) [id number] Page 5 of 9 Signed: 04/10/2023 4:15:38 PM By: Yevonne Pax RN Signed: 04/11/2023 2:21:22 PM By: Harold Derry PA-C Entered By: Yevonne Pax on 04/10/2023 05:21:30 -------------------------------------------------------------------------------- Problem List Details Patient Name: Date of Service: Harold Lovely HN M. 04/10/2023 8:00 A M Medical Record Number: [id number] Patient Account Number: [id number] Date of Birth/Sex: Treating RN: 01/24/1961 (62 y.o. [name] Primary Care Provider: Saralyn [name] Other Clinician: Referring Provider: Treating Provider/Extender: [name] in Treatment: 5 Active Problems ICD-10 Encounter Code Description Active Date MDM Diagnosis E11.621 Type 2 diabetes mellitus with foot ulcer 03/04/2023 No Yes L97.512 Non-pressure chronic ulcer of other part of right foot with fat layer exposed 03/04/2023 No Yes I73.89 Other specified  peripheral vascular diseases 03/04/2023 No Yes N18.4 Chronic kidney disease, stage 4 (severe) 03/04/2023 No Yes I10 Essential (primary) hypertension 03/04/2023 No Yes Inactive Problems Resolved Problems Electronic Signature(s) Signed: 04/10/2023 8:15:01 AM By: Harold Derry PA-C Entered By: [name] on 04/10/2023 05:15:01 [name] ([id number]) [id number] Page 6 of 9 -------------------------------------------------------------------------------- Progress Note Details Patient Name: Date of Service: Harold Mould M. 04/10/2023 8:00 A M Medical Record Number: [id number] Patient Account Number: [id number] Date of Birth/Sex: Treating RN: 31-Jul-1960 (62 y.o. [name]) Yevonne Pax Primary Care Provider: Saralyn [name] Other Clinician: Referring Provider: Treating Provider/Extender: [name] in Treatment: 5 Subjective Chief Complaint Information obtained from Patient Right plantar foot ulcer History of Present Illness (HPI) Chronic/Inactive Condition: 03-04-2023 patient's arterial screening today actually appears to show some signs of being somewhat low at 0.83 on the left 1.53 on the right which is essentially noncompressible. I really do not think we can assure ourselves of these readings and I think he really should see vascular for a thorough arterial evaluation in order to ensure that he has good blood flow although I suspect he probably does. 07/20/17 on evaluation today patient actually appears to be doing somewhat poorly in regard to his right plantar great toe ulcer. This is something that he states started as what he thought to be a blister May 04, 2018. Subsequently he treated this on his own for  [id number] Date of Birth/Sex: Treating RN: 1961/01/19 (62 y.o. [name]) Yevonne Pax Primary Care Provider: Saralyn [name] Other Clinician: Referring Provider: Treating Provider/Extender: [name] in Treatment: 5 Verbal / Phone Orders: No Diagnosis Coding ICD-10 Coding Code Description E11.621 Type 2 diabetes mellitus with foot ulcer L97.512 Non-pressure chronic ulcer of other part of right foot with fat layer exposed I73.89 Other specified peripheral vascular diseases N18.4 Chronic kidney disease, stage 4 (severe) I10 Essential (primary) hypertension Follow-up Appointments Return Appointment in 1 week. Bathing/  Shower/ Hygiene May shower; gently cleanse wound with antibacterial soap, rinse and pat dry prior to dressing wounds Anesthetic (Use 'Patient Medications' Section for Anesthetic Order Entry) Lidocaine applied to wound bed Off-Loading Open toe surgical shoe with peg assist. Wound Treatment Wound #2 - Foot Wound Laterality: Plantar, Right Cleanser: Soap and Water 3 x Per Week/30 Days Discharge Instructions: Gently cleanse wound with antibacterial soap, rinse and pat dry prior to dressing wounds Prim Dressing: Hydrofera Blue Ready Transfer Foam, 2.5x2.5 (in/in) (Dispense As Written) 3 x Per Week/30 Days ary Discharge Instructions: Apply Hydrofera Blue Ready to wound bed as directed Secondary Dressing: (BORDER) Zetuvit Plus SILICONE BORDER Dressing 4x4 (in/in) (Generic) 3 x Per Week/30 Days Discharge Instructions: Please do not put silicone bordered dressings under wraps. Use non-bordered dressing only. Electronic Signature(s) [name] [place] ([id number]) [id number] Page 5 of 9 Signed: 04/10/2023 4:15:38 PM By: Yevonne Pax RN Signed: 04/11/2023 2:21:22 PM By: Harold Derry PA-C Entered By: Yevonne Pax on 04/10/2023 05:21:30 -------------------------------------------------------------------------------- Problem List Details Patient Name: Date of Service: Harold Lovely HN M. 04/10/2023 8:00 A M Medical Record Number: [id number] Patient Account Number: [id number] Date of Birth/Sex: Treating RN: 01/24/1961 (62 y.o. [name] Primary Care Provider: Saralyn [name] Other Clinician: Referring Provider: Treating Provider/Extender: [name] in Treatment: 5 Active Problems ICD-10 Encounter Code Description Active Date MDM Diagnosis E11.621 Type 2 diabetes mellitus with foot ulcer 03/04/2023 No Yes L97.512 Non-pressure chronic ulcer of other part of right foot with fat layer exposed 03/04/2023 No Yes I73.89 Other specified  peripheral vascular diseases 03/04/2023 No Yes N18.4 Chronic kidney disease, stage 4 (severe) 03/04/2023 No Yes I10 Essential (primary) hypertension 03/04/2023 No Yes Inactive Problems Resolved Problems Electronic Signature(s) Signed: 04/10/2023 8:15:01 AM By: Harold Derry PA-C Entered By: [name] on 04/10/2023 05:15:01 [name] ([id number]) [id number] Page 6 of 9 -------------------------------------------------------------------------------- Progress Note Details Patient Name: Date of Service: Harold Mould M. 04/10/2023 8:00 A M Medical Record Number: [id number] Patient Account Number: [id number] Date of Birth/Sex: Treating RN: 31-Jul-1960 (62 y.o. [name]) Yevonne Pax Primary Care Provider: Saralyn [name] Other Clinician: Referring Provider: Treating Provider/Extender: [name] in Treatment: 5 Subjective Chief Complaint Information obtained from Patient Right plantar foot ulcer History of Present Illness (HPI) Chronic/Inactive Condition: 03-04-2023 patient's arterial screening today actually appears to show some signs of being somewhat low at 0.83 on the left 1.53 on the right which is essentially noncompressible. I really do not think we can assure ourselves of these readings and I think he really should see vascular for a thorough arterial evaluation in order to ensure that he has good blood flow although I suspect he probably does. 07/20/17 on evaluation today patient actually appears to be doing somewhat poorly in regard to his right plantar great toe ulcer. This is something that he states started as what he thought to be a blister May 04, 2018. Subsequently he treated this on his own for

## 2023-04-17 ENCOUNTER — Encounter: Payer: Medicaid Other | Attending: Physician Assistant | Admitting: Physician Assistant

## 2023-04-17 DIAGNOSIS — E1122 Type 2 diabetes mellitus with diabetic chronic kidney disease: Secondary | ICD-10-CM | POA: Insufficient documentation

## 2023-04-17 DIAGNOSIS — E11622 Type 2 diabetes mellitus with other skin ulcer: Secondary | ICD-10-CM | POA: Diagnosis not present

## 2023-04-17 DIAGNOSIS — E11621 Type 2 diabetes mellitus with foot ulcer: Secondary | ICD-10-CM | POA: Insufficient documentation

## 2023-04-17 DIAGNOSIS — E1151 Type 2 diabetes mellitus with diabetic peripheral angiopathy without gangrene: Secondary | ICD-10-CM | POA: Diagnosis not present

## 2023-04-17 DIAGNOSIS — I129 Hypertensive chronic kidney disease with stage 1 through stage 4 chronic kidney disease, or unspecified chronic kidney disease: Secondary | ICD-10-CM | POA: Diagnosis not present

## 2023-04-17 DIAGNOSIS — N184 Chronic kidney disease, stage 4 (severe): Secondary | ICD-10-CM | POA: Insufficient documentation

## 2023-04-17 DIAGNOSIS — L97512 Non-pressure chronic ulcer of other part of right foot with fat layer exposed: Secondary | ICD-10-CM | POA: Diagnosis not present

## 2023-04-17 NOTE — Progress Notes (Addendum)
debridement I did perform debridement clearway necrotic debris he tolerated that today without complication and postdebridement wound bed appears to be doing significantly better which is great news. Electronic Signature(s) Signed: 04/17/2023 9:44:42 AM By: Allen Derry PA-C Entered By: Allen Derry on 04/17/2023 06:44:42 -------------------------------------------------------------------------------- Physician Orders Details Patient Name: Date of Service: Harold Lovely HN M. 04/17/2023 8:45 A M Medical Record Number: [id number] Patient Account Number: [id number] Date of Birth/Sex: Treating RN: 11-29-1960 (62 y.o. [name]) Yevonne Pax Primary Care Provider: [name] Other Clinician: Referring Provider: Treating Provider/Extender: [name] in Treatment:  6 Verbal / Phone Orders: No Diagnosis Coding ICD-10 Coding Code Description E11.621 Type 2 diabetes mellitus with foot ulcer L97.512 Non-pressure chronic ulcer of other part of right foot with fat layer exposed I73.89 Other specified peripheral vascular diseases N18.4 Chronic kidney disease, stage 4 (severe) I10 Essential (primary) hypertension Follow-up Appointments Return Appointment in 1 week. Bathing/ Shower/ Hygiene May shower; gently cleanse wound with antibacterial soap, rinse and pat dry prior to dressing wounds Anesthetic (Use 'Patient Medications' Section for Anesthetic Order Entry) Lidocaine applied to wound bed Off-Loading Open toe surgical shoe with peg assist. Wound Treatment Wound #2 - Foot Wound Laterality: Plantar, Right Cleanser: Soap and Water 3 x Per Week/30 Days Discharge Instructions: Gently cleanse wound with antibacterial soap, rinse and pat dry prior to dressing wounds Prim Dressing: Hydrofera Blue Ready Transfer Foam, 2.5x2.5 (in/in) (Dispense As Written) 3 x Per Week/30 Days ary Discharge Instructions: Apply Hydrofera Blue Ready to wound bed as directed Secondary Dressing: (BORDER) Zetuvit Plus SILICONE BORDER Dressing 4x4 (in/in) (Generic) 3 x Per Week/30 Days Discharge Instructions: Please do not put silicone bordered dressings under wraps. Use non-bordered dressing only. [name], [name] ([id number]) [id number] Page 5 of 9 Electronic Signature(s) Signed: 04/18/2023 2:08:40 PM By: Allen Derry PA-C Signed: 05/12/2023 8:01:26 AM By: Yevonne Pax RN Entered By: Yevonne Pax on 04/17/2023 06:03:54 -------------------------------------------------------------------------------- Problem List Details Patient Name: Date of Service: Harold Lovely HN M. 04/17/2023 8:45 A M Medical Record Number: [id number] Patient Account Number: [id number] Date of Birth/Sex: Treating RN: 07/06/1961 (62 y.o. [name] Primary Care Provider:  [name] Other Clinician: Referring Provider: Treating Provider/Extender: [name] in Treatment: 6 Active Problems ICD-10 Encounter Code Description Active Date MDM Diagnosis E11.621 Type 2 diabetes mellitus with foot ulcer 03/04/2023 No Yes L97.512 Non-pressure chronic ulcer of other part of right foot with fat layer exposed 03/04/2023 No Yes I73.89 Other specified peripheral vascular diseases 03/04/2023 No Yes N18.4 Chronic kidney disease, stage 4 (severe) 03/04/2023 No Yes I10 Essential (primary) hypertension 03/04/2023 No Yes Inactive Problems Resolved Problems Electronic Signature(s) Signed: 04/17/2023 8:49:01 AM By: Allen Derry PA-C Entered By: Allen Derry on 04/17/2023 05:49:01 [name] ([id number]) [id number] Page 6 of 9 -------------------------------------------------------------------------------- Progress Note Details Patient Name: Date of Service: Harold Mould M. 04/17/2023 8:45 A M Medical Record Number: [id number] Patient Account Number: [id number] Date of Birth/Sex: Treating RN: [birth year] (62 y.o. [name]) Yevonne Pax Primary Care Provider: [name] Other Clinician: Referring Provider: Treating Provider/Extender: [name] in Treatment: 6 Subjective Chief Complaint Information obtained from Patient Right plantar foot ulcer History of Present Illness (HPI) Chronic/Inactive Condition: 03-04-2023 patient's arterial screening today actually appears to show some signs of being somewhat low at 0.83 on the left 1.53 on the right which is essentially noncompressible. I really do not think we can assure ourselves of these readings and I think he really should  that he has been treating this just at home using some Betadine on the area but really nothing else has been done up to this point. This is actually his first office for his care is concerned in a clinic setting. Patient's past medical history actually is very similar to previous when I saw him. He does have a history of diabetes mellitus type 2, peripheral vascular disease, chronic kidney disease stage IV, hypertension, he has not had any formal arterial testing up to this point. Patient is likely going require an x-ray today. 8/27; patient was seen for the first time last week he has a wound over the metatarsal head  on the right foot. 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 Allena Katz of podiatry. 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 He has been using Hydrofera Blue and sit to vet to the wound area. He had an x-ray done that showed faint sclerosis of the head of the second metatarsal which was nonspecific but new from 07/16/2018 which could be posttraumatic secondary to avascular necrosis or chronic infection 03-20-2023 upon evaluation today patient appears to be doing pretty well currently in regard to the plantar aspect of his foot. This is actually showing signs of improvement which is good news. So far I am seeing enough improvement and I am pretty pleased with where we are at although I did tell him that if he does not continue to see ongoing size improvements that we may want to consider a total contact cast. 03-27-2023 upon evaluation today patient appears to be doing well currently in regard to his wound. Has been tolerating the dressing changes without complication. Fortunately I do not see any evidence of worsening or infection overall. I am very pleased with where we stand today. 04-03-2023 upon evaluation today patient appears to be doing well currently in regard to his wound on the foot. Fortunately I do not see any signs of active infection locally or systemically at this time. With that being said the wound is getting very tiny and I think he is doing quite well. 04-10-2023 upon evaluation today patient appears to be doing well currently in regard to his wound. This is going require some debridement but does seem to be making progress towards closure which is good news. 04-17-2023 upon evaluation today patient appears to be doing well currently in regard to his foot ulcer although it is not making as much progress as what we would like to see. I do believe that he likely is going to need to be more aggressive in the treatment of this and  specifically I think that he probably needs a total contact cast. Electronic Signature(s) Signed: 04/17/2023 9:44:26 AM By: Allen Derry PA-C Entered By: Allen Derry on 04/17/2023 06:44:26 -------------------------------------------------------------------------------- Physical Exam Details Patient Name: Date of Service: Harold Mould M. 04/17/2023 8:45 A M Medical Record Number: [id number] Patient Account Number: [id number] Date of Birth/Sex: Treating RN: 03/13/1961 (62 y.o. [name] Primary Care Provider: [name] Other Clinician: Referring Provider: Treating Provider/Extender: [name] in Treatment: 6 [name], [name] [place] ([id number]) [id number] Page 4 of 9 Constitutional Well-nourished and well-hydrated in no acute distress. Respiratory normal breathing without difficulty. Psychiatric this patient is able to make decisions and demonstrates good insight into disease process. Alert and Oriented x 3. pleasant and cooperative. Notes Upon inspection patient's wound bed actually showed signs of need for sharp  that he has been treating this just at home using some Betadine on the area but really nothing else has been done up to this point. This is actually his first office for his care is concerned in a clinic setting. Patient's past medical history actually is very similar to previous when I saw him. He does have a history of diabetes mellitus type 2, peripheral vascular disease, chronic kidney disease stage IV, hypertension, he has not had any formal arterial testing up to this point. Patient is likely going require an x-ray today. 8/27; patient was seen for the first time last week he has a wound over the metatarsal head  on the right foot. 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 Allena Katz of podiatry. 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 He has been using Hydrofera Blue and sit to vet to the wound area. He had an x-ray done that showed faint sclerosis of the head of the second metatarsal which was nonspecific but new from 07/16/2018 which could be posttraumatic secondary to avascular necrosis or chronic infection 03-20-2023 upon evaluation today patient appears to be doing pretty well currently in regard to the plantar aspect of his foot. This is actually showing signs of improvement which is good news. So far I am seeing enough improvement and I am pretty pleased with where we are at although I did tell him that if he does not continue to see ongoing size improvements that we may want to consider a total contact cast. 03-27-2023 upon evaluation today patient appears to be doing well currently in regard to his wound. Has been tolerating the dressing changes without complication. Fortunately I do not see any evidence of worsening or infection overall. I am very pleased with where we stand today. 04-03-2023 upon evaluation today patient appears to be doing well currently in regard to his wound on the foot. Fortunately I do not see any signs of active infection locally or systemically at this time. With that being said the wound is getting very tiny and I think he is doing quite well. 04-10-2023 upon evaluation today patient appears to be doing well currently in regard to his wound. This is going require some debridement but does seem to be making progress towards closure which is good news. 04-17-2023 upon evaluation today patient appears to be doing well currently in regard to his foot ulcer although it is not making as much progress as what we would like to see. I do believe that he likely is going to need to be more aggressive in the treatment of this and  specifically I think that he probably needs a total contact cast. Electronic Signature(s) Signed: 04/17/2023 9:44:26 AM By: Allen Derry PA-C Entered By: Allen Derry on 04/17/2023 06:44:26 -------------------------------------------------------------------------------- Physical Exam Details Patient Name: Date of Service: Harold Mould M. 04/17/2023 8:45 A M Medical Record Number: [id number] Patient Account Number: [id number] Date of Birth/Sex: Treating RN: 03/13/1961 (62 y.o. [name] Primary Care Provider: [name] Other Clinician: Referring Provider: Treating Provider/Extender: [name] in Treatment: 6 [name], [name] [place] ([id number]) [id number] Page 4 of 9 Constitutional Well-nourished and well-hydrated in no acute distress. Respiratory normal breathing without difficulty. Psychiatric this patient is able to make decisions and demonstrates good insight into disease process. Alert and Oriented x 3. pleasant and cooperative. Notes Upon inspection patient's wound bed actually showed signs of need for sharp  that he has been treating this just at home using some Betadine on the area but really nothing else has been done up to this point. This is actually his first office for his care is concerned in a clinic setting. Patient's past medical history actually is very similar to previous when I saw him. He does have a history of diabetes mellitus type 2, peripheral vascular disease, chronic kidney disease stage IV, hypertension, he has not had any formal arterial testing up to this point. Patient is likely going require an x-ray today. 8/27; patient was seen for the first time last week he has a wound over the metatarsal head  on the right foot. 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 Allena Katz of podiatry. 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 He has been using Hydrofera Blue and sit to vet to the wound area. He had an x-ray done that showed faint sclerosis of the head of the second metatarsal which was nonspecific but new from 07/16/2018 which could be posttraumatic secondary to avascular necrosis or chronic infection 03-20-2023 upon evaluation today patient appears to be doing pretty well currently in regard to the plantar aspect of his foot. This is actually showing signs of improvement which is good news. So far I am seeing enough improvement and I am pretty pleased with where we are at although I did tell him that if he does not continue to see ongoing size improvements that we may want to consider a total contact cast. 03-27-2023 upon evaluation today patient appears to be doing well currently in regard to his wound. Has been tolerating the dressing changes without complication. Fortunately I do not see any evidence of worsening or infection overall. I am very pleased with where we stand today. 04-03-2023 upon evaluation today patient appears to be doing well currently in regard to his wound on the foot. Fortunately I do not see any signs of active infection locally or systemically at this time. With that being said the wound is getting very tiny and I think he is doing quite well. 04-10-2023 upon evaluation today patient appears to be doing well currently in regard to his wound. This is going require some debridement but does seem to be making progress towards closure which is good news. 04-17-2023 upon evaluation today patient appears to be doing well currently in regard to his foot ulcer although it is not making as much progress as what we would like to see. I do believe that he likely is going to need to be more aggressive in the treatment of this and  specifically I think that he probably needs a total contact cast. Electronic Signature(s) Signed: 04/17/2023 9:44:26 AM By: Allen Derry PA-C Entered By: Allen Derry on 04/17/2023 06:44:26 -------------------------------------------------------------------------------- Physical Exam Details Patient Name: Date of Service: Harold Mould M. 04/17/2023 8:45 A M Medical Record Number: [id number] Patient Account Number: [id number] Date of Birth/Sex: Treating RN: 03/13/1961 (62 y.o. [name] Primary Care Provider: [name] Other Clinician: Referring Provider: Treating Provider/Extender: [name] in Treatment: 6 [name], [name] [place] ([id number]) [id number] Page 4 of 9 Constitutional Well-nourished and well-hydrated in no acute distress. Respiratory normal breathing without difficulty. Psychiatric this patient is able to make decisions and demonstrates good insight into disease process. Alert and Oriented x 3. pleasant and cooperative. Notes Upon inspection patient's wound bed actually showed signs of need for sharp  feel like the pressure and friction is still a major complicating issue here. I discussed that with the patient at this point. 2. I am going to recommend as well that we should go ahead and continue with the [facility] though I am going to look towards doing the total contact cast come next week. 3. I am also going to recommend that the patient should continue to offload using the shoe in the meantime until we get him in the cast. We will see patient back for reevaluation in 1 week here in the clinic. If anything worsens or changes patient will contact our office for additional recommendations. Electronic Signature(s) Signed: 04/17/2023 9:45:45 AM By: Allen Derry PA-C Entered By: Allen Derry on 04/17/2023 06:45:45 -------------------------------------------------------------------------------- SuperBill Details Patient Name: Date of Service: Harold Mould M. 04/17/2023 Medical Record Number: [id number] Patient Account Number: [id number] Date of Birth/Sex: Treating RN: Apr 14, 1961 (62 y.o. 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 [name], [name] ([id number]) [id number] Page 9 of 9 I73.89 Other specified peripheral vascular diseases N18.4 Chronic kidney disease, stage 4 (severe) I10 Essential (primary)  hypertension Facility Procedures : CPT4 Code: [id number] Description: 11042 - DEB SUBQ TISSUE 20 SQ CM/< ICD-10 Diagnosis Description L97.512 Non-pressure chronic ulcer of other part of right foot with fat layer exposed Modifier: Quantity: 1 Physician Procedures : CPT4 Code Description Modifier [id number] 11042 - WC PHYS SUBQ TISS 20 SQ CM ICD-10 Diagnosis Description L97.512 Non-pressure chronic ulcer of other part of right foot with fat layer exposed Quantity: 1 Electronic Signature(s) Signed: 04/17/2023 9:49:03 AM By: Allen Derry PA-C Entered By: Allen Derry on 04/17/2023 06:49:03  debridement I did perform debridement clearway necrotic debris he tolerated that today without complication and postdebridement wound bed appears to be doing significantly better which is great news. Electronic Signature(s) Signed: 04/17/2023 9:44:42 AM By: Allen Derry PA-C Entered By: Allen Derry on 04/17/2023 06:44:42 -------------------------------------------------------------------------------- Physician Orders Details Patient Name: Date of Service: Harold Lovely HN M. 04/17/2023 8:45 A M Medical Record Number: [id number] Patient Account Number: [id number] Date of Birth/Sex: Treating RN: 11-29-1960 (62 y.o. [name]) Yevonne Pax Primary Care Provider: [name] Other Clinician: Referring Provider: Treating Provider/Extender: [name] in Treatment:  6 Verbal / Phone Orders: No Diagnosis Coding ICD-10 Coding Code Description E11.621 Type 2 diabetes mellitus with foot ulcer L97.512 Non-pressure chronic ulcer of other part of right foot with fat layer exposed I73.89 Other specified peripheral vascular diseases N18.4 Chronic kidney disease, stage 4 (severe) I10 Essential (primary) hypertension Follow-up Appointments Return Appointment in 1 week. Bathing/ Shower/ Hygiene May shower; gently cleanse wound with antibacterial soap, rinse and pat dry prior to dressing wounds Anesthetic (Use 'Patient Medications' Section for Anesthetic Order Entry) Lidocaine applied to wound bed Off-Loading Open toe surgical shoe with peg assist. Wound Treatment Wound #2 - Foot Wound Laterality: Plantar, Right Cleanser: Soap and Water 3 x Per Week/30 Days Discharge Instructions: Gently cleanse wound with antibacterial soap, rinse and pat dry prior to dressing wounds Prim Dressing: Hydrofera Blue Ready Transfer Foam, 2.5x2.5 (in/in) (Dispense As Written) 3 x Per Week/30 Days ary Discharge Instructions: Apply Hydrofera Blue Ready to wound bed as directed Secondary Dressing: (BORDER) Zetuvit Plus SILICONE BORDER Dressing 4x4 (in/in) (Generic) 3 x Per Week/30 Days Discharge Instructions: Please do not put silicone bordered dressings under wraps. Use non-bordered dressing only. [name], [name] ([id number]) [id number] Page 5 of 9 Electronic Signature(s) Signed: 04/18/2023 2:08:40 PM By: Allen Derry PA-C Signed: 05/12/2023 8:01:26 AM By: Yevonne Pax RN Entered By: Yevonne Pax on 04/17/2023 06:03:54 -------------------------------------------------------------------------------- Problem List Details Patient Name: Date of Service: Harold Lovely HN M. 04/17/2023 8:45 A M Medical Record Number: [id number] Patient Account Number: [id number] Date of Birth/Sex: Treating RN: 07/06/1961 (62 y.o. [name] Primary Care Provider:  [name] Other Clinician: Referring Provider: Treating Provider/Extender: [name] in Treatment: 6 Active Problems ICD-10 Encounter Code Description Active Date MDM Diagnosis E11.621 Type 2 diabetes mellitus with foot ulcer 03/04/2023 No Yes L97.512 Non-pressure chronic ulcer of other part of right foot with fat layer exposed 03/04/2023 No Yes I73.89 Other specified peripheral vascular diseases 03/04/2023 No Yes N18.4 Chronic kidney disease, stage 4 (severe) 03/04/2023 No Yes I10 Essential (primary) hypertension 03/04/2023 No Yes Inactive Problems Resolved Problems Electronic Signature(s) Signed: 04/17/2023 8:49:01 AM By: Allen Derry PA-C Entered By: Allen Derry on 04/17/2023 05:49:01 [name] ([id number]) [id number] Page 6 of 9 -------------------------------------------------------------------------------- Progress Note Details Patient Name: Date of Service: Harold Mould M. 04/17/2023 8:45 A M Medical Record Number: [id number] Patient Account Number: [id number] Date of Birth/Sex: Treating RN: [birth year] (62 y.o. [name]) Yevonne Pax Primary Care Provider: [name] Other Clinician: Referring Provider: Treating Provider/Extender: [name] in Treatment: 6 Subjective Chief Complaint Information obtained from Patient Right plantar foot ulcer History of Present Illness (HPI) Chronic/Inactive Condition: 03-04-2023 patient's arterial screening today actually appears to show some signs of being somewhat low at 0.83 on the left 1.53 on the right which is essentially noncompressible. I really do not think we can assure ourselves of these readings and I think he really should  debridement I did perform debridement clearway necrotic debris he tolerated that today without complication and postdebridement wound bed appears to be doing significantly better which is great news. Electronic Signature(s) Signed: 04/17/2023 9:44:42 AM By: Allen Derry PA-C Entered By: Allen Derry on 04/17/2023 06:44:42 -------------------------------------------------------------------------------- Physician Orders Details Patient Name: Date of Service: Harold Lovely HN M. 04/17/2023 8:45 A M Medical Record Number: [id number] Patient Account Number: [id number] Date of Birth/Sex: Treating RN: 11-29-1960 (62 y.o. [name]) Yevonne Pax Primary Care Provider: [name] Other Clinician: Referring Provider: Treating Provider/Extender: [name] in Treatment:  6 Verbal / Phone Orders: No Diagnosis Coding ICD-10 Coding Code Description E11.621 Type 2 diabetes mellitus with foot ulcer L97.512 Non-pressure chronic ulcer of other part of right foot with fat layer exposed I73.89 Other specified peripheral vascular diseases N18.4 Chronic kidney disease, stage 4 (severe) I10 Essential (primary) hypertension Follow-up Appointments Return Appointment in 1 week. Bathing/ Shower/ Hygiene May shower; gently cleanse wound with antibacterial soap, rinse and pat dry prior to dressing wounds Anesthetic (Use 'Patient Medications' Section for Anesthetic Order Entry) Lidocaine applied to wound bed Off-Loading Open toe surgical shoe with peg assist. Wound Treatment Wound #2 - Foot Wound Laterality: Plantar, Right Cleanser: Soap and Water 3 x Per Week/30 Days Discharge Instructions: Gently cleanse wound with antibacterial soap, rinse and pat dry prior to dressing wounds Prim Dressing: Hydrofera Blue Ready Transfer Foam, 2.5x2.5 (in/in) (Dispense As Written) 3 x Per Week/30 Days ary Discharge Instructions: Apply Hydrofera Blue Ready to wound bed as directed Secondary Dressing: (BORDER) Zetuvit Plus SILICONE BORDER Dressing 4x4 (in/in) (Generic) 3 x Per Week/30 Days Discharge Instructions: Please do not put silicone bordered dressings under wraps. Use non-bordered dressing only. [name], [name] ([id number]) [id number] Page 5 of 9 Electronic Signature(s) Signed: 04/18/2023 2:08:40 PM By: Allen Derry PA-C Signed: 05/12/2023 8:01:26 AM By: Yevonne Pax RN Entered By: Yevonne Pax on 04/17/2023 06:03:54 -------------------------------------------------------------------------------- Problem List Details Patient Name: Date of Service: Harold Lovely HN M. 04/17/2023 8:45 A M Medical Record Number: [id number] Patient Account Number: [id number] Date of Birth/Sex: Treating RN: 07/06/1961 (62 y.o. [name] Primary Care Provider:  [name] Other Clinician: Referring Provider: Treating Provider/Extender: [name] in Treatment: 6 Active Problems ICD-10 Encounter Code Description Active Date MDM Diagnosis E11.621 Type 2 diabetes mellitus with foot ulcer 03/04/2023 No Yes L97.512 Non-pressure chronic ulcer of other part of right foot with fat layer exposed 03/04/2023 No Yes I73.89 Other specified peripheral vascular diseases 03/04/2023 No Yes N18.4 Chronic kidney disease, stage 4 (severe) 03/04/2023 No Yes I10 Essential (primary) hypertension 03/04/2023 No Yes Inactive Problems Resolved Problems Electronic Signature(s) Signed: 04/17/2023 8:49:01 AM By: Allen Derry PA-C Entered By: Allen Derry on 04/17/2023 05:49:01 [name] ([id number]) [id number] Page 6 of 9 -------------------------------------------------------------------------------- Progress Note Details Patient Name: Date of Service: Harold Mould M. 04/17/2023 8:45 A M Medical Record Number: [id number] Patient Account Number: [id number] Date of Birth/Sex: Treating RN: [birth year] (62 y.o. [name]) Yevonne Pax Primary Care Provider: [name] Other Clinician: Referring Provider: Treating Provider/Extender: [name] in Treatment: 6 Subjective Chief Complaint Information obtained from Patient Right plantar foot ulcer History of Present Illness (HPI) Chronic/Inactive Condition: 03-04-2023 patient's arterial screening today actually appears to show some signs of being somewhat low at 0.83 on the left 1.53 on the right which is essentially noncompressible. I really do not think we can assure ourselves of these readings and I think he really should  debridement I did perform debridement clearway necrotic debris he tolerated that today without complication and postdebridement wound bed appears to be doing significantly better which is great news. Electronic Signature(s) Signed: 04/17/2023 9:44:42 AM By: Allen Derry PA-C Entered By: Allen Derry on 04/17/2023 06:44:42 -------------------------------------------------------------------------------- Physician Orders Details Patient Name: Date of Service: Harold Lovely HN M. 04/17/2023 8:45 A M Medical Record Number: [id number] Patient Account Number: [id number] Date of Birth/Sex: Treating RN: 11-29-1960 (62 y.o. [name]) Yevonne Pax Primary Care Provider: [name] Other Clinician: Referring Provider: Treating Provider/Extender: [name] in Treatment:  6 Verbal / Phone Orders: No Diagnosis Coding ICD-10 Coding Code Description E11.621 Type 2 diabetes mellitus with foot ulcer L97.512 Non-pressure chronic ulcer of other part of right foot with fat layer exposed I73.89 Other specified peripheral vascular diseases N18.4 Chronic kidney disease, stage 4 (severe) I10 Essential (primary) hypertension Follow-up Appointments Return Appointment in 1 week. Bathing/ Shower/ Hygiene May shower; gently cleanse wound with antibacterial soap, rinse and pat dry prior to dressing wounds Anesthetic (Use 'Patient Medications' Section for Anesthetic Order Entry) Lidocaine applied to wound bed Off-Loading Open toe surgical shoe with peg assist. Wound Treatment Wound #2 - Foot Wound Laterality: Plantar, Right Cleanser: Soap and Water 3 x Per Week/30 Days Discharge Instructions: Gently cleanse wound with antibacterial soap, rinse and pat dry prior to dressing wounds Prim Dressing: Hydrofera Blue Ready Transfer Foam, 2.5x2.5 (in/in) (Dispense As Written) 3 x Per Week/30 Days ary Discharge Instructions: Apply Hydrofera Blue Ready to wound bed as directed Secondary Dressing: (BORDER) Zetuvit Plus SILICONE BORDER Dressing 4x4 (in/in) (Generic) 3 x Per Week/30 Days Discharge Instructions: Please do not put silicone bordered dressings under wraps. Use non-bordered dressing only. [name], [name] ([id number]) [id number] Page 5 of 9 Electronic Signature(s) Signed: 04/18/2023 2:08:40 PM By: Allen Derry PA-C Signed: 05/12/2023 8:01:26 AM By: Yevonne Pax RN Entered By: Yevonne Pax on 04/17/2023 06:03:54 -------------------------------------------------------------------------------- Problem List Details Patient Name: Date of Service: Harold Lovely HN M. 04/17/2023 8:45 A M Medical Record Number: [id number] Patient Account Number: [id number] Date of Birth/Sex: Treating RN: 07/06/1961 (62 y.o. [name] Primary Care Provider:  [name] Other Clinician: Referring Provider: Treating Provider/Extender: [name] in Treatment: 6 Active Problems ICD-10 Encounter Code Description Active Date MDM Diagnosis E11.621 Type 2 diabetes mellitus with foot ulcer 03/04/2023 No Yes L97.512 Non-pressure chronic ulcer of other part of right foot with fat layer exposed 03/04/2023 No Yes I73.89 Other specified peripheral vascular diseases 03/04/2023 No Yes N18.4 Chronic kidney disease, stage 4 (severe) 03/04/2023 No Yes I10 Essential (primary) hypertension 03/04/2023 No Yes Inactive Problems Resolved Problems Electronic Signature(s) Signed: 04/17/2023 8:49:01 AM By: Allen Derry PA-C Entered By: Allen Derry on 04/17/2023 05:49:01 [name] ([id number]) [id number] Page 6 of 9 -------------------------------------------------------------------------------- Progress Note Details Patient Name: Date of Service: Harold Mould M. 04/17/2023 8:45 A M Medical Record Number: [id number] Patient Account Number: [id number] Date of Birth/Sex: Treating RN: [birth year] (62 y.o. [name]) Yevonne Pax Primary Care Provider: [name] Other Clinician: Referring Provider: Treating Provider/Extender: [name] in Treatment: 6 Subjective Chief Complaint Information obtained from Patient Right plantar foot ulcer History of Present Illness (HPI) Chronic/Inactive Condition: 03-04-2023 patient's arterial screening today actually appears to show some signs of being somewhat low at 0.83 on the left 1.53 on the right which is essentially noncompressible. I really do not think we can assure ourselves of these readings and I think he really should  feel like the pressure and friction is still a major complicating issue here. I discussed that with the patient at this point. 2. I am going to recommend as well that we should go ahead and continue with the [facility] though I am going to look towards doing the total contact cast come next week. 3. I am also going to recommend that the patient should continue to offload using the shoe in the meantime until we get him in the cast. We will see patient back for reevaluation in 1 week here in the clinic. If anything worsens or changes patient will contact our office for additional recommendations. Electronic Signature(s) Signed: 04/17/2023 9:45:45 AM By: Allen Derry PA-C Entered By: Allen Derry on 04/17/2023 06:45:45 -------------------------------------------------------------------------------- SuperBill Details Patient Name: Date of Service: Harold Mould M. 04/17/2023 Medical Record Number: [id number] Patient Account Number: [id number] Date of Birth/Sex: Treating RN: Apr 14, 1961 (62 y.o. 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 [name], [name] ([id number]) [id number] Page 9 of 9 I73.89 Other specified peripheral vascular diseases N18.4 Chronic kidney disease, stage 4 (severe) I10 Essential (primary)  hypertension Facility Procedures : CPT4 Code: [id number] Description: 11042 - DEB SUBQ TISSUE 20 SQ CM/< ICD-10 Diagnosis Description L97.512 Non-pressure chronic ulcer of other part of right foot with fat layer exposed Modifier: Quantity: 1 Physician Procedures : CPT4 Code Description Modifier [id number] 11042 - WC PHYS SUBQ TISS 20 SQ CM ICD-10 Diagnosis Description L97.512 Non-pressure chronic ulcer of other part of right foot with fat layer exposed Quantity: 1 Electronic Signature(s) Signed: 04/17/2023 9:49:03 AM By: Allen Derry PA-C Entered By: Allen Derry on 04/17/2023 06:49:03  [name], [name] [place] ([id number]) [id number] Page 1 of 9 Visit Report for 04/17/2023 Chief Complaint Document Details Patient Name: Date of Service: Harold Lovely [facility] M. 04/17/2023 8:45 A M Medical Record Number: [id number] Patient Account Number: [id number] Date of Birth/Sex: Treating RN: 01/29/1961 (62 y.o. [name]) Yevonne Pax Primary Care Provider: [name] Other Clinician: Referring Provider: Treating Provider/Extender: [name] in Treatment: 6 Information Obtained from: Patient Chief Complaint Right plantar foot ulcer Electronic Signature(s) Signed: 04/17/2023 8:49:04 AM By: Allen Derry PA-C Entered By: Allen Derry on 04/17/2023 05:49:03 -------------------------------------------------------------------------------- Debridement Details Patient Name: Date of Service: Harold Lovely HN M. 04/17/2023 8:45 A M Medical Record Number: [id number] Patient Account Number: [id number] Date of Birth/Sex: Treating RN: [birth year] (62 y.o. [name]) Yevonne Pax Primary Care Provider: [name] Other Clinician: Referring Provider: Treating Provider/Extender: [name] in Treatment: 6 Debridement Performed for Assessment: Wound #2 Right,Plantar Foot Performed By: Physician Allen Derry, PA-C Debridement Type: Debridement Severity of Tissue Pre Debridement: Fat layer exposed Level of Consciousness (Pre-procedure): Awake and Alert Pre-procedure Verification/Time Out Yes - 09:00 Taken: Start Time: 09:00 Percent of Wound Bed Debrided: 100% T Area Debrided (cm): otal 0.03 Tissue and other material debrided: Viable, Non-Viable, Callus, Slough, Subcutaneous, Skin: Dermis , Skin: Epidermis, Slough Level: Skin/Subcutaneous Tissue Debridement Description: Excisional Instrument: Curette Bleeding: Minimum Hemostasis Achieved: Pressure End Time: 09:06 Procedural Pain: 0 Post Procedural PainBREXTEN, [name] ([id number]) [id number] Page 2 of 9 Response to Treatment: Procedure was tolerated well Level of Consciousness (Post- Awake and Alert procedure): Post Debridement Measurements of Total Wound Length: (cm) 0.2 Width: (cm) 0.2 Depth: (cm) 0.2 Volume: (cm) 0.006 Character of Wound/Ulcer Post Debridement: Improved Severity of Tissue Post Debridement: Fat layer exposed Post Procedure Diagnosis Same as Pre-procedure Electronic Signature(s) Signed: 04/18/2023 2:08:40 PM By: Allen Derry PA-C Signed: 05/12/2023 8:01:26 AM By: Yevonne Pax RN Entered By: Yevonne Pax on 04/17/2023 06:04:59 -------------------------------------------------------------------------------- HPI Details Patient Name: Date of Service: Harold Lovely HN M. 04/17/2023 8:45 A M Medical Record Number: [id number] Patient Account Number: [id number] Date of Birth/Sex: Treating RN: 10-21-1960 (62 y.o. [name] Primary Care Provider: [name] Other Clinician: Referring Provider: Treating Provider/Extender: [name] in Treatment: 6 History of Present Illness Chronic/Inactive Conditions Condition 1: 03-04-2023 patient's arterial screening today actually appears to show some signs of being somewhat low at 0.83 on the left 1.53 on the right which is essentially noncompressible. I really do not think we can assure ourselves of these readings and I think he really should see vascular for a thorough arterial evaluation in order to ensure that he has good blood flow although I suspect he probably does. HPI Description: 07/20/17 on evaluation today patient actually appears to be doing somewhat poorly in regard to his right plantar great toe ulcer. This is something that he states started as what he thought to be a blister May 04, 2018. Subsequently he treated this on his own for a while thinking that it would just get better. As it  did not get better he eventually did go to have this evaluated where it was treated appropriately with it sounds like Bactroban ointment however the patient states that it never completely resolved. Subsequently if this continue to get worse and was looking more irritated and inflamed he did go to the hospital more recently where he was prescribed Keflex. Subsequently he was also referred to the wound care center here for further evaluation by [place].

## 2023-04-22 ENCOUNTER — Encounter: Payer: Medicaid Other | Admitting: Physician Assistant

## 2023-04-22 DIAGNOSIS — N184 Chronic kidney disease, stage 4 (severe): Secondary | ICD-10-CM | POA: Diagnosis not present

## 2023-04-22 DIAGNOSIS — E1151 Type 2 diabetes mellitus with diabetic peripheral angiopathy without gangrene: Secondary | ICD-10-CM | POA: Diagnosis not present

## 2023-04-22 DIAGNOSIS — E11622 Type 2 diabetes mellitus with other skin ulcer: Secondary | ICD-10-CM | POA: Diagnosis not present

## 2023-04-22 DIAGNOSIS — I129 Hypertensive chronic kidney disease with stage 1 through stage 4 chronic kidney disease, or unspecified chronic kidney disease: Secondary | ICD-10-CM | POA: Diagnosis not present

## 2023-04-22 DIAGNOSIS — L97512 Non-pressure chronic ulcer of other part of right foot with fat layer exposed: Secondary | ICD-10-CM | POA: Diagnosis not present

## 2023-04-22 DIAGNOSIS — E1122 Type 2 diabetes mellitus with diabetic chronic kidney disease: Secondary | ICD-10-CM | POA: Diagnosis not present

## 2023-04-22 DIAGNOSIS — E11621 Type 2 diabetes mellitus with foot ulcer: Secondary | ICD-10-CM | POA: Diagnosis not present

## 2023-04-22 NOTE — Progress Notes (Addendum)
[name], [name] ([id number]) [id number] Page 1 of 9 Visit Report for 04/22/2023 Chief Complaint Document Details Patient Name: Date of Service: [name] [facility] M. 04/22/2023 3:15 PM Medical Record Number: [id number] Patient Account Number: [id number] Date of Birth/Sex: Treating RN: 1960-11-09 (62 y.o. [name]) Yevonne Pax Primary Care Provider: [name] Other Clinician: Referring Provider: Treating Provider/Extender: [name] in Treatment: 7 Information Obtained from: Patient Chief Complaint Right plantar foot ulcer Electronic Signature(s) Signed: 04/22/2023 3:25:26 PM By: Allen Derry PA-C Entered By: Allen Derry on 04/22/2023 12:25:26 -------------------------------------------------------------------------------- Debridement Details Patient Name: Date of Service: [name] HN M. 04/22/2023 3:15 PM Medical Record Number: [id number] Patient Account Number: [id number] Date of Birth/Sex: Treating RN: October 29, 1960 (62 y.o. [name]) Yevonne Pax Primary Care Provider: [name] Other Clinician: Referring Provider: Treating Provider/Extender: [name] in Treatment: 7 Debridement Performed for Assessment: Wound #2 Right,Plantar Foot Performed By: Physician Allen Derry, PA-C Debridement Type: Debridement Severity of Tissue Pre Debridement: Fat layer exposed Level of Consciousness (Pre-procedure): Awake and Alert Pre-procedure Verification/Time Out Yes - 15:30 Taken: Start Time: 15:30 Percent of Wound Bed Debrided: 100% T Area Debrided (cm): otal 0.03 Tissue and other material debrided: Viable, Non-Viable, Callus, Subcutaneous Level: Skin/Subcutaneous Tissue Debridement Description: Excisional Instrument: Curette Bleeding: Moderate Hemostasis Achieved: Silver Nitrate End Time: 15:32 Procedural Pain: 0 Post Procedural Pain: 0 [name], [name] ([id number])  [id number] Page 2 of 9 Response to Treatment: Procedure was tolerated well Level of Consciousness (Post- Awake and Alert procedure): Post Debridement Measurements of Total Wound Length: (cm) 0.2 Width: (cm) 0.2 Depth: (cm) 0.2 Volume: (cm) 0.006 Character of Wound/Ulcer Post Debridement: Improved Severity of Tissue Post Debridement: Fat layer exposed Post Procedure Diagnosis Same as Pre-procedure Electronic Signature(s) Signed: 04/22/2023 4:49:45 PM By: Allen Derry PA-C Signed: 05/12/2023 8:01:26 AM By: Yevonne Pax RN Entered By: Yevonne Pax on 04/22/2023 12:31:40 -------------------------------------------------------------------------------- HPI Details Patient Name: Date of Service: [name] HN M. 04/22/2023 3:15 PM Medical Record Number: [id number] Patient Account Number: [id number] Date of Birth/Sex: Treating RN: 1960/12/31 (62 y.o. [name] Primary Care Provider: [name] Other Clinician: Referring Provider: Treating Provider/Extender: [name] in Treatment: 7 History of Present Illness Chronic/Inactive Conditions Condition 1: 03-04-2023 patient's arterial screening today actually appears to show some signs of being somewhat low at 0.83 on the left 1.53 on the right which is essentially noncompressible. I really do not think we can assure ourselves of these readings and I think he really should see vascular for a thorough arterial evaluation in order to ensure that he has good blood flow although I suspect he probably does. HPI Description: 07/20/17 on evaluation today patient actually appears to be doing somewhat poorly in regard to his right plantar great toe ulcer. This is something that he states started as what he thought to be a blister May 04, 2018. Subsequently he treated this on his own for a while thinking that it would just get better. As it did not get better he eventually did  go to have this evaluated where it was treated appropriately with it sounds like Bactroban ointment however the patient states that it never completely resolved. Subsequently if this continue to get worse and was looking more irritated and inflamed he did go to the hospital more recently where he was prescribed Keflex. Subsequently he was also referred to the wound care center here for further evaluation by [place]. He had an x-ray in the ER which revealed  [id number] Page 4 of 9 Date of Birth/Sex: Treating RN: [birth year] (62 y.o. [name] Primary Care Provider: [name] Other Clinician: Referring Provider: Treating Provider/Extender: [name] in Treatment: 7 Constitutional Well-nourished and well-hydrated in no acute distress. Respiratory normal breathing without difficulty. Psychiatric this patient is able to make decisions and demonstrates good insight into disease process. Alert and Oriented x 3. pleasant and cooperative. Notes Upon inspection patient's wound bed showed evidence of good granulation and epithelization at this point. Fortunately I do not see any signs of worsening overall believe that the patient is making headway towards closure and I am very pleased with where we stand currently. Electronic Signature(s) Signed: 04/22/2023 3:40:47 PM By: Allen Derry PA-C Entered By: Allen Derry on 04/22/2023  12:40:47 -------------------------------------------------------------------------------- Physician Orders Details Patient Name: Date of Service: Harold Mould M. 04/22/2023 3:15 PM Medical Record Number: [id number] Patient Account Number: [id number] Date of Birth/Sex: Treating RN: 05-28-1961 (62 y.o. 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 Bathing/ Shower/ Hygiene May shower; gently cleanse wound with antibacterial soap, rinse and pat dry prior to dressing wounds Anesthetic (Use 'Patient Medications' Section for Anesthetic Order Entry) Lidocaine applied to wound bed Off-Loading Total Contact Cast to Right Lower Extremity Wound Treatment Wound #2 - Foot Wound Laterality: Plantar, Right Cleanser: Soap and Water 2 x Per Week/30 Days Discharge Instructions: Gently cleanse wound with antibacterial soap, rinse and pat dry prior to dressing wounds [name], [name] ([id number]) [id number] Page 5 of 9 Prim Dressing: Hydrofera Blue Ready Transfer Foam, 2.5x2.5 (in/in) (Dispense As Written) 2 x Per Week/30 Days ary Discharge Instructions: Apply Hydrofera Blue Ready to wound bed as directed Secondary Dressing: ABD Pad 5x9 (in/in) 2 x Per Week/30 Days Discharge Instructions: Cover with ABD pad Electronic Signature(s) Signed: 04/22/2023 4:49:45 PM By: Allen Derry PA-C Signed: 05/12/2023 8:01:26 AM By: Yevonne Pax RN Entered  By: Yevonne Pax on 04/22/2023 12:32:40 -------------------------------------------------------------------------------- Problem List Details Patient Name: Date of Service: [name] HN M. 04/22/2023 3:15 PM Medical Record Number: [id number] Patient Account Number: [id number] Date of Birth/Sex: Treating RN: 1961/05/03 (62 y.o. Lina Sar, Lyla Son Primary Care Provider: [name] Other Clinician: Referring Provider: Treating Provider/Extender: [name] in Treatment: 7 Active Problems ICD-10 Encounter Code Description Active Date MDM Diagnosis E11.621 Type 2 diabetes mellitus with foot ulcer 03/04/2023 No Yes L97.512 Non-pressure chronic ulcer of other part of right foot with fat layer exposed 03/04/2023 No Yes I73.89 Other specified peripheral vascular diseases 03/04/2023 No Yes N18.4 Chronic kidney disease, stage 4 (severe) 03/04/2023 No Yes I10 Essential (primary) hypertension 03/04/2023 No Yes Inactive Problems Resolved Problems Electronic Signature(s) Signed: 04/22/2023 3:25:23 PM By: Allen Derry PA-C Entered By: Allen Derry on 04/22/2023 12:25:23 [name] ([id number]) [id number] Page 6 of 9 -------------------------------------------------------------------------------- Progress Note Details Patient Name: Date of Service: Harold Mote. 04/22/2023 3:15 PM Medical Record Number: [id number] Patient Account Number: [id number] Date of Birth/Sex: Treating RN: [birth year] (62 y.o. [name]) Yevonne Pax Primary Care Provider: [name] Other Clinician: Referring Provider: Treating Provider/Extender: Larina Bras,  Follow-up Appointments: Return Appointment in 1 week. Bathing/ Shower/ Hygiene: May shower; gently cleanse wound with antibacterial soap, rinse and pat dry prior to dressing wounds Anesthetic (Use 'Patient Medications' Section for Anesthetic Order Entry): Lidocaine applied to wound bed Off-Loading: T Contact Cast to Right Lower Extremity otal WOUND #2: - Foot Wound Laterality: Plantar, Right Cleanser: Soap and Water 2 x Per Week/30 Days Discharge Instructions: Gently cleanse wound with antibacterial soap, rinse and pat dry prior to dressing wounds Prim Dressing: Hydrofera Blue Ready Transfer Foam, 2.5x2.5 (in/in) (Dispense As Written) 2 x Per Week/30 Days ary Discharge Instructions: Apply Hydrofera Blue Ready to wound bed as directed Secondary Dressing: ABD Pad 5x9 (in/in) 2 x Per Week/30 Days Discharge Instructions: Cover with ABD pad 1. I would recommend based on what we are seeing that we go ahead and initiate total contact cast and perform debridement postdebridement wound bed is significantly improved and he looks to be doing excellent today. 2. I am good recommend as well patient should continue to monitor for any evidence of infection or worsening I do believe the [facility] is a good option and we are can continue with that as such. 3. I am also can recommend that the patient should continue with an ABD pad to cover followed by roll gauze to secure in place and then again the total contact cast today which I did apply. We will see patient back for reevaluation in 1 week here in the clinic. If anything worsens or changes patient will contact our office for additional recommendations. We will see him on Thursday for reevaluation and to change the cast out. Electronic Signature(s) Signed: 04/22/2023 3:41:24 PM By: Allen Derry PA-C Entered By: Allen Derry on 04/22/2023  12:41:24 -------------------------------------------------------------------------------- Total Contact Cast Details Patient Name: Date of Service: Harold Mote. 04/22/2023 3:15 PM Medical Record Number: [id number] Patient Account Number: [id number] Date of Birth/Sex: Treating RN: 08/24/1960 (62 y.o. [name]) Yevonne Pax Primary Care Provider: [name] Other Clinician: Referring Provider: Treating Provider/Extender: [name] in Treatment: 7 T Contact Cast Applied for Wound Assessment: otal Wound #2 Right,Plantar Foot Performed By: Physician Allen Derry, PA-C Post Procedure Diagnosis ARMSTRONG, LOLLIS ([id number]) [id number] Page 9 of 9 Same as Pre-procedure Electronic Signature(s) Signed: 04/22/2023 4:09:22 PM By: Yevonne Pax RN Signed: 04/22/2023 4:49:45 PM By: Allen Derry PA-C Entered By: Yevonne Pax on 04/22/2023 13:09:22 -------------------------------------------------------------------------------- SuperBill Details Patient Name: Date of Service: Harold Mould M. 04/22/2023 Medical Record Number: [id number] Patient Account Number: [id number] Date of Birth/Sex: Treating RN: 06/14/1961 (62 y.o. 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 I73.89 Other specified peripheral vascular diseases N18.4 Chronic kidney disease, stage 4 (severe) I10 Essential (primary) hypertension Facility Procedures : CPT4 Code: 41324401 Description: 11042 - DEB SUBQ TISSUE 20 SQ CM/< ICD-10 Diagnosis Description L97.512 Non-pressure chronic ulcer of other part of right foot with fat layer  exposed Modifier: Quantity: 1 Physician Procedures : CPT4 Code Description Modifier [id number] 11042 - WC PHYS SUBQ TISS 20 SQ CM ICD-10 Diagnosis Description L97.512 Non-pressure chronic ulcer of other part of right foot with fat layer exposed Quantity: 1 Electronic Signature(s) Signed: 04/22/2023 3:44:56 PM By: Allen Derry PA-C Entered By: Allen Derry on 04/22/2023 12:44:55  [id number] Page 4 of 9 Date of Birth/Sex: Treating RN: [birth year] (62 y.o. [name] Primary Care Provider: [name] Other Clinician: Referring Provider: Treating Provider/Extender: [name] in Treatment: 7 Constitutional Well-nourished and well-hydrated in no acute distress. Respiratory normal breathing without difficulty. Psychiatric this patient is able to make decisions and demonstrates good insight into disease process. Alert and Oriented x 3. pleasant and cooperative. Notes Upon inspection patient's wound bed showed evidence of good granulation and epithelization at this point. Fortunately I do not see any signs of worsening overall believe that the patient is making headway towards closure and I am very pleased with where we stand currently. Electronic Signature(s) Signed: 04/22/2023 3:40:47 PM By: Allen Derry PA-C Entered By: Allen Derry on 04/22/2023  12:40:47 -------------------------------------------------------------------------------- Physician Orders Details Patient Name: Date of Service: Harold Mould M. 04/22/2023 3:15 PM Medical Record Number: [id number] Patient Account Number: [id number] Date of Birth/Sex: Treating RN: 05-28-1961 (62 y.o. 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 Bathing/ Shower/ Hygiene May shower; gently cleanse wound with antibacterial soap, rinse and pat dry prior to dressing wounds Anesthetic (Use 'Patient Medications' Section for Anesthetic Order Entry) Lidocaine applied to wound bed Off-Loading Total Contact Cast to Right Lower Extremity Wound Treatment Wound #2 - Foot Wound Laterality: Plantar, Right Cleanser: Soap and Water 2 x Per Week/30 Days Discharge Instructions: Gently cleanse wound with antibacterial soap, rinse and pat dry prior to dressing wounds [name], [name] ([id number]) [id number] Page 5 of 9 Prim Dressing: Hydrofera Blue Ready Transfer Foam, 2.5x2.5 (in/in) (Dispense As Written) 2 x Per Week/30 Days ary Discharge Instructions: Apply Hydrofera Blue Ready to wound bed as directed Secondary Dressing: ABD Pad 5x9 (in/in) 2 x Per Week/30 Days Discharge Instructions: Cover with ABD pad Electronic Signature(s) Signed: 04/22/2023 4:49:45 PM By: Allen Derry PA-C Signed: 05/12/2023 8:01:26 AM By: Yevonne Pax RN Entered  By: Yevonne Pax on 04/22/2023 12:32:40 -------------------------------------------------------------------------------- Problem List Details Patient Name: Date of Service: [name] HN M. 04/22/2023 3:15 PM Medical Record Number: [id number] Patient Account Number: [id number] Date of Birth/Sex: Treating RN: 1961/05/03 (62 y.o. Lina Sar, Lyla Son Primary Care Provider: [name] Other Clinician: Referring Provider: Treating Provider/Extender: [name] in Treatment: 7 Active Problems ICD-10 Encounter Code Description Active Date MDM Diagnosis E11.621 Type 2 diabetes mellitus with foot ulcer 03/04/2023 No Yes L97.512 Non-pressure chronic ulcer of other part of right foot with fat layer exposed 03/04/2023 No Yes I73.89 Other specified peripheral vascular diseases 03/04/2023 No Yes N18.4 Chronic kidney disease, stage 4 (severe) 03/04/2023 No Yes I10 Essential (primary) hypertension 03/04/2023 No Yes Inactive Problems Resolved Problems Electronic Signature(s) Signed: 04/22/2023 3:25:23 PM By: Allen Derry PA-C Entered By: Allen Derry on 04/22/2023 12:25:23 [name] ([id number]) [id number] Page 6 of 9 -------------------------------------------------------------------------------- Progress Note Details Patient Name: Date of Service: Harold Mote. 04/22/2023 3:15 PM Medical Record Number: [id number] Patient Account Number: [id number] Date of Birth/Sex: Treating RN: [birth year] (62 y.o. [name]) Yevonne Pax Primary Care Provider: [name] Other Clinician: Referring Provider: Treating Provider/Extender: Larina Bras,  Follow-up Appointments: Return Appointment in 1 week. Bathing/ Shower/ Hygiene: May shower; gently cleanse wound with antibacterial soap, rinse and pat dry prior to dressing wounds Anesthetic (Use 'Patient Medications' Section for Anesthetic Order Entry): Lidocaine applied to wound bed Off-Loading: T Contact Cast to Right Lower Extremity otal WOUND #2: - Foot Wound Laterality: Plantar, Right Cleanser: Soap and Water 2 x Per Week/30 Days Discharge Instructions: Gently cleanse wound with antibacterial soap, rinse and pat dry prior to dressing wounds Prim Dressing: Hydrofera Blue Ready Transfer Foam, 2.5x2.5 (in/in) (Dispense As Written) 2 x Per Week/30 Days ary Discharge Instructions: Apply Hydrofera Blue Ready to wound bed as directed Secondary Dressing: ABD Pad 5x9 (in/in) 2 x Per Week/30 Days Discharge Instructions: Cover with ABD pad 1. I would recommend based on what we are seeing that we go ahead and initiate total contact cast and perform debridement postdebridement wound bed is significantly improved and he looks to be doing excellent today. 2. I am good recommend as well patient should continue to monitor for any evidence of infection or worsening I do believe the [facility] is a good option and we are can continue with that as such. 3. I am also can recommend that the patient should continue with an ABD pad to cover followed by roll gauze to secure in place and then again the total contact cast today which I did apply. We will see patient back for reevaluation in 1 week here in the clinic. If anything worsens or changes patient will contact our office for additional recommendations. We will see him on Thursday for reevaluation and to change the cast out. Electronic Signature(s) Signed: 04/22/2023 3:41:24 PM By: Allen Derry PA-C Entered By: Allen Derry on 04/22/2023  12:41:24 -------------------------------------------------------------------------------- Total Contact Cast Details Patient Name: Date of Service: Harold Mote. 04/22/2023 3:15 PM Medical Record Number: [id number] Patient Account Number: [id number] Date of Birth/Sex: Treating RN: 08/24/1960 (62 y.o. [name]) Yevonne Pax Primary Care Provider: [name] Other Clinician: Referring Provider: Treating Provider/Extender: [name] in Treatment: 7 T Contact Cast Applied for Wound Assessment: otal Wound #2 Right,Plantar Foot Performed By: Physician Allen Derry, PA-C Post Procedure Diagnosis ARMSTRONG, LOLLIS ([id number]) [id number] Page 9 of 9 Same as Pre-procedure Electronic Signature(s) Signed: 04/22/2023 4:09:22 PM By: Yevonne Pax RN Signed: 04/22/2023 4:49:45 PM By: Allen Derry PA-C Entered By: Yevonne Pax on 04/22/2023 13:09:22 -------------------------------------------------------------------------------- SuperBill Details Patient Name: Date of Service: Harold Mould M. 04/22/2023 Medical Record Number: [id number] Patient Account Number: [id number] Date of Birth/Sex: Treating RN: 06/14/1961 (62 y.o. 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 I73.89 Other specified peripheral vascular diseases N18.4 Chronic kidney disease, stage 4 (severe) I10 Essential (primary) hypertension Facility Procedures : CPT4 Code: 41324401 Description: 11042 - DEB SUBQ TISSUE 20 SQ CM/< ICD-10 Diagnosis Description L97.512 Non-pressure chronic ulcer of other part of right foot with fat layer  exposed Modifier: Quantity: 1 Physician Procedures : CPT4 Code Description Modifier [id number] 11042 - WC PHYS SUBQ TISS 20 SQ CM ICD-10 Diagnosis Description L97.512 Non-pressure chronic ulcer of other part of right foot with fat layer exposed Quantity: 1 Electronic Signature(s) Signed: 04/22/2023 3:44:56 PM By: Allen Derry PA-C Entered By: Allen Derry on 04/22/2023 12:44:55  [name], [name] ([id number]) [id number] Page 1 of 9 Visit Report for 04/22/2023 Chief Complaint Document Details Patient Name: Date of Service: [name] [facility] M. 04/22/2023 3:15 PM Medical Record Number: [id number] Patient Account Number: [id number] Date of Birth/Sex: Treating RN: 1960-11-09 (62 y.o. [name]) Yevonne Pax Primary Care Provider: [name] Other Clinician: Referring Provider: Treating Provider/Extender: [name] in Treatment: 7 Information Obtained from: Patient Chief Complaint Right plantar foot ulcer Electronic Signature(s) Signed: 04/22/2023 3:25:26 PM By: Allen Derry PA-C Entered By: Allen Derry on 04/22/2023 12:25:26 -------------------------------------------------------------------------------- Debridement Details Patient Name: Date of Service: [name] HN M. 04/22/2023 3:15 PM Medical Record Number: [id number] Patient Account Number: [id number] Date of Birth/Sex: Treating RN: October 29, 1960 (62 y.o. [name]) Yevonne Pax Primary Care Provider: [name] Other Clinician: Referring Provider: Treating Provider/Extender: [name] in Treatment: 7 Debridement Performed for Assessment: Wound #2 Right,Plantar Foot Performed By: Physician Allen Derry, PA-C Debridement Type: Debridement Severity of Tissue Pre Debridement: Fat layer exposed Level of Consciousness (Pre-procedure): Awake and Alert Pre-procedure Verification/Time Out Yes - 15:30 Taken: Start Time: 15:30 Percent of Wound Bed Debrided: 100% T Area Debrided (cm): otal 0.03 Tissue and other material debrided: Viable, Non-Viable, Callus, Subcutaneous Level: Skin/Subcutaneous Tissue Debridement Description: Excisional Instrument: Curette Bleeding: Moderate Hemostasis Achieved: Silver Nitrate End Time: 15:32 Procedural Pain: 0 Post Procedural Pain: 0 [name], [name] ([id number])  [id number] Page 2 of 9 Response to Treatment: Procedure was tolerated well Level of Consciousness (Post- Awake and Alert procedure): Post Debridement Measurements of Total Wound Length: (cm) 0.2 Width: (cm) 0.2 Depth: (cm) 0.2 Volume: (cm) 0.006 Character of Wound/Ulcer Post Debridement: Improved Severity of Tissue Post Debridement: Fat layer exposed Post Procedure Diagnosis Same as Pre-procedure Electronic Signature(s) Signed: 04/22/2023 4:49:45 PM By: Allen Derry PA-C Signed: 05/12/2023 8:01:26 AM By: Yevonne Pax RN Entered By: Yevonne Pax on 04/22/2023 12:31:40 -------------------------------------------------------------------------------- HPI Details Patient Name: Date of Service: [name] HN M. 04/22/2023 3:15 PM Medical Record Number: [id number] Patient Account Number: [id number] Date of Birth/Sex: Treating RN: 1960/12/31 (62 y.o. [name] Primary Care Provider: [name] Other Clinician: Referring Provider: Treating Provider/Extender: [name] in Treatment: 7 History of Present Illness Chronic/Inactive Conditions Condition 1: 03-04-2023 patient's arterial screening today actually appears to show some signs of being somewhat low at 0.83 on the left 1.53 on the right which is essentially noncompressible. I really do not think we can assure ourselves of these readings and I think he really should see vascular for a thorough arterial evaluation in order to ensure that he has good blood flow although I suspect he probably does. HPI Description: 07/20/17 on evaluation today patient actually appears to be doing somewhat poorly in regard to his right plantar great toe ulcer. This is something that he states started as what he thought to be a blister May 04, 2018. Subsequently he treated this on his own for a while thinking that it would just get better. As it did not get better he eventually did  go to have this evaluated where it was treated appropriately with it sounds like Bactroban ointment however the patient states that it never completely resolved. Subsequently if this continue to get worse and was looking more irritated and inflamed he did go to the hospital more recently where he was prescribed Keflex. Subsequently he was also referred to the wound care center here for further evaluation by [place]. He had an x-ray in the ER which revealed  [name], [name] ([id number]) [id number] Page 1 of 9 Visit Report for 04/22/2023 Chief Complaint Document Details Patient Name: Date of Service: [name] [facility] M. 04/22/2023 3:15 PM Medical Record Number: [id number] Patient Account Number: [id number] Date of Birth/Sex: Treating RN: 1960-11-09 (62 y.o. [name]) Yevonne Pax Primary Care Provider: [name] Other Clinician: Referring Provider: Treating Provider/Extender: [name] in Treatment: 7 Information Obtained from: Patient Chief Complaint Right plantar foot ulcer Electronic Signature(s) Signed: 04/22/2023 3:25:26 PM By: Allen Derry PA-C Entered By: Allen Derry on 04/22/2023 12:25:26 -------------------------------------------------------------------------------- Debridement Details Patient Name: Date of Service: [name] HN M. 04/22/2023 3:15 PM Medical Record Number: [id number] Patient Account Number: [id number] Date of Birth/Sex: Treating RN: October 29, 1960 (62 y.o. [name]) Yevonne Pax Primary Care Provider: [name] Other Clinician: Referring Provider: Treating Provider/Extender: [name] in Treatment: 7 Debridement Performed for Assessment: Wound #2 Right,Plantar Foot Performed By: Physician Allen Derry, PA-C Debridement Type: Debridement Severity of Tissue Pre Debridement: Fat layer exposed Level of Consciousness (Pre-procedure): Awake and Alert Pre-procedure Verification/Time Out Yes - 15:30 Taken: Start Time: 15:30 Percent of Wound Bed Debrided: 100% T Area Debrided (cm): otal 0.03 Tissue and other material debrided: Viable, Non-Viable, Callus, Subcutaneous Level: Skin/Subcutaneous Tissue Debridement Description: Excisional Instrument: Curette Bleeding: Moderate Hemostasis Achieved: Silver Nitrate End Time: 15:32 Procedural Pain: 0 Post Procedural Pain: 0 [name], [name] ([id number])  [id number] Page 2 of 9 Response to Treatment: Procedure was tolerated well Level of Consciousness (Post- Awake and Alert procedure): Post Debridement Measurements of Total Wound Length: (cm) 0.2 Width: (cm) 0.2 Depth: (cm) 0.2 Volume: (cm) 0.006 Character of Wound/Ulcer Post Debridement: Improved Severity of Tissue Post Debridement: Fat layer exposed Post Procedure Diagnosis Same as Pre-procedure Electronic Signature(s) Signed: 04/22/2023 4:49:45 PM By: Allen Derry PA-C Signed: 05/12/2023 8:01:26 AM By: Yevonne Pax RN Entered By: Yevonne Pax on 04/22/2023 12:31:40 -------------------------------------------------------------------------------- HPI Details Patient Name: Date of Service: [name] HN M. 04/22/2023 3:15 PM Medical Record Number: [id number] Patient Account Number: [id number] Date of Birth/Sex: Treating RN: 1960/12/31 (62 y.o. [name] Primary Care Provider: [name] Other Clinician: Referring Provider: Treating Provider/Extender: [name] in Treatment: 7 History of Present Illness Chronic/Inactive Conditions Condition 1: 03-04-2023 patient's arterial screening today actually appears to show some signs of being somewhat low at 0.83 on the left 1.53 on the right which is essentially noncompressible. I really do not think we can assure ourselves of these readings and I think he really should see vascular for a thorough arterial evaluation in order to ensure that he has good blood flow although I suspect he probably does. HPI Description: 07/20/17 on evaluation today patient actually appears to be doing somewhat poorly in regard to his right plantar great toe ulcer. This is something that he states started as what he thought to be a blister May 04, 2018. Subsequently he treated this on his own for a while thinking that it would just get better. As it did not get better he eventually did  go to have this evaluated where it was treated appropriately with it sounds like Bactroban ointment however the patient states that it never completely resolved. Subsequently if this continue to get worse and was looking more irritated and inflamed he did go to the hospital more recently where he was prescribed Keflex. Subsequently he was also referred to the wound care center here for further evaluation by [place]. He had an x-ray in the ER which revealed  [id number] Page 4 of 9 Date of Birth/Sex: Treating RN: [birth year] (62 y.o. [name] Primary Care Provider: [name] Other Clinician: Referring Provider: Treating Provider/Extender: [name] in Treatment: 7 Constitutional Well-nourished and well-hydrated in no acute distress. Respiratory normal breathing without difficulty. Psychiatric this patient is able to make decisions and demonstrates good insight into disease process. Alert and Oriented x 3. pleasant and cooperative. Notes Upon inspection patient's wound bed showed evidence of good granulation and epithelization at this point. Fortunately I do not see any signs of worsening overall believe that the patient is making headway towards closure and I am very pleased with where we stand currently. Electronic Signature(s) Signed: 04/22/2023 3:40:47 PM By: Allen Derry PA-C Entered By: Allen Derry on 04/22/2023  12:40:47 -------------------------------------------------------------------------------- Physician Orders Details Patient Name: Date of Service: Harold Mould M. 04/22/2023 3:15 PM Medical Record Number: [id number] Patient Account Number: [id number] Date of Birth/Sex: Treating RN: 05-28-1961 (62 y.o. 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 Bathing/ Shower/ Hygiene May shower; gently cleanse wound with antibacterial soap, rinse and pat dry prior to dressing wounds Anesthetic (Use 'Patient Medications' Section for Anesthetic Order Entry) Lidocaine applied to wound bed Off-Loading Total Contact Cast to Right Lower Extremity Wound Treatment Wound #2 - Foot Wound Laterality: Plantar, Right Cleanser: Soap and Water 2 x Per Week/30 Days Discharge Instructions: Gently cleanse wound with antibacterial soap, rinse and pat dry prior to dressing wounds [name], [name] ([id number]) [id number] Page 5 of 9 Prim Dressing: Hydrofera Blue Ready Transfer Foam, 2.5x2.5 (in/in) (Dispense As Written) 2 x Per Week/30 Days ary Discharge Instructions: Apply Hydrofera Blue Ready to wound bed as directed Secondary Dressing: ABD Pad 5x9 (in/in) 2 x Per Week/30 Days Discharge Instructions: Cover with ABD pad Electronic Signature(s) Signed: 04/22/2023 4:49:45 PM By: Allen Derry PA-C Signed: 05/12/2023 8:01:26 AM By: Yevonne Pax RN Entered  By: Yevonne Pax on 04/22/2023 12:32:40 -------------------------------------------------------------------------------- Problem List Details Patient Name: Date of Service: [name] HN M. 04/22/2023 3:15 PM Medical Record Number: [id number] Patient Account Number: [id number] Date of Birth/Sex: Treating RN: 1961/05/03 (62 y.o. Lina Sar, Lyla Son Primary Care Provider: [name] Other Clinician: Referring Provider: Treating Provider/Extender: [name] in Treatment: 7 Active Problems ICD-10 Encounter Code Description Active Date MDM Diagnosis E11.621 Type 2 diabetes mellitus with foot ulcer 03/04/2023 No Yes L97.512 Non-pressure chronic ulcer of other part of right foot with fat layer exposed 03/04/2023 No Yes I73.89 Other specified peripheral vascular diseases 03/04/2023 No Yes N18.4 Chronic kidney disease, stage 4 (severe) 03/04/2023 No Yes I10 Essential (primary) hypertension 03/04/2023 No Yes Inactive Problems Resolved Problems Electronic Signature(s) Signed: 04/22/2023 3:25:23 PM By: Allen Derry PA-C Entered By: Allen Derry on 04/22/2023 12:25:23 [name] ([id number]) [id number] Page 6 of 9 -------------------------------------------------------------------------------- Progress Note Details Patient Name: Date of Service: Harold Mote. 04/22/2023 3:15 PM Medical Record Number: [id number] Patient Account Number: [id number] Date of Birth/Sex: Treating RN: [birth year] (62 y.o. [name]) Yevonne Pax Primary Care Provider: [name] Other Clinician: Referring Provider: Treating Provider/Extender: Larina Bras,  Follow-up Appointments: Return Appointment in 1 week. Bathing/ Shower/ Hygiene: May shower; gently cleanse wound with antibacterial soap, rinse and pat dry prior to dressing wounds Anesthetic (Use 'Patient Medications' Section for Anesthetic Order Entry): Lidocaine applied to wound bed Off-Loading: T Contact Cast to Right Lower Extremity otal WOUND #2: - Foot Wound Laterality: Plantar, Right Cleanser: Soap and Water 2 x Per Week/30 Days Discharge Instructions: Gently cleanse wound with antibacterial soap, rinse and pat dry prior to dressing wounds Prim Dressing: Hydrofera Blue Ready Transfer Foam, 2.5x2.5 (in/in) (Dispense As Written) 2 x Per Week/30 Days ary Discharge Instructions: Apply Hydrofera Blue Ready to wound bed as directed Secondary Dressing: ABD Pad 5x9 (in/in) 2 x Per Week/30 Days Discharge Instructions: Cover with ABD pad 1. I would recommend based on what we are seeing that we go ahead and initiate total contact cast and perform debridement postdebridement wound bed is significantly improved and he looks to be doing excellent today. 2. I am good recommend as well patient should continue to monitor for any evidence of infection or worsening I do believe the [facility] is a good option and we are can continue with that as such. 3. I am also can recommend that the patient should continue with an ABD pad to cover followed by roll gauze to secure in place and then again the total contact cast today which I did apply. We will see patient back for reevaluation in 1 week here in the clinic. If anything worsens or changes patient will contact our office for additional recommendations. We will see him on Thursday for reevaluation and to change the cast out. Electronic Signature(s) Signed: 04/22/2023 3:41:24 PM By: Allen Derry PA-C Entered By: Allen Derry on 04/22/2023  12:41:24 -------------------------------------------------------------------------------- Total Contact Cast Details Patient Name: Date of Service: Harold Mote. 04/22/2023 3:15 PM Medical Record Number: [id number] Patient Account Number: [id number] Date of Birth/Sex: Treating RN: 08/24/1960 (62 y.o. [name]) Yevonne Pax Primary Care Provider: [name] Other Clinician: Referring Provider: Treating Provider/Extender: [name] in Treatment: 7 T Contact Cast Applied for Wound Assessment: otal Wound #2 Right,Plantar Foot Performed By: Physician Allen Derry, PA-C Post Procedure Diagnosis ARMSTRONG, LOLLIS ([id number]) [id number] Page 9 of 9 Same as Pre-procedure Electronic Signature(s) Signed: 04/22/2023 4:09:22 PM By: Yevonne Pax RN Signed: 04/22/2023 4:49:45 PM By: Allen Derry PA-C Entered By: Yevonne Pax on 04/22/2023 13:09:22 -------------------------------------------------------------------------------- SuperBill Details Patient Name: Date of Service: Harold Mould M. 04/22/2023 Medical Record Number: [id number] Patient Account Number: [id number] Date of Birth/Sex: Treating RN: 06/14/1961 (62 y.o. 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 I73.89 Other specified peripheral vascular diseases N18.4 Chronic kidney disease, stage 4 (severe) I10 Essential (primary) hypertension Facility Procedures : CPT4 Code: [id number] Description: 11042 - DEB SUBQ TISSUE 20 SQ CM/< ICD-10 Diagnosis Description L97.512 Non-pressure chronic ulcer of other part of right foot with fat layer  exposed Modifier: Quantity: 1 Physician Procedures : CPT4 Code Description Modifier [id number] 11042 - WC PHYS SUBQ TISS 20 SQ CM ICD-10 Diagnosis Description L97.512 Non-pressure chronic ulcer of other part of right foot with fat layer exposed Quantity: 1 Electronic Signature(s) Signed: 04/22/2023 3:44:56 PM By: Allen Derry PA-C Entered By: Allen Derry on 04/22/2023 12:44:55  [id number] Page 4 of 9 Date of Birth/Sex: Treating RN: [birth year] (62 y.o. [name] Primary Care Provider: [name] Other Clinician: Referring Provider: Treating Provider/Extender: [name] in Treatment: 7 Constitutional Well-nourished and well-hydrated in no acute distress. Respiratory normal breathing without difficulty. Psychiatric this patient is able to make decisions and demonstrates good insight into disease process. Alert and Oriented x 3. pleasant and cooperative. Notes Upon inspection patient's wound bed showed evidence of good granulation and epithelization at this point. Fortunately I do not see any signs of worsening overall believe that the patient is making headway towards closure and I am very pleased with where we stand currently. Electronic Signature(s) Signed: 04/22/2023 3:40:47 PM By: Allen Derry PA-C Entered By: Allen Derry on 04/22/2023  12:40:47 -------------------------------------------------------------------------------- Physician Orders Details Patient Name: Date of Service: Harold Mould M. 04/22/2023 3:15 PM Medical Record Number: [id number] Patient Account Number: [id number] Date of Birth/Sex: Treating RN: 05-28-1961 (62 y.o. 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 Bathing/ Shower/ Hygiene May shower; gently cleanse wound with antibacterial soap, rinse and pat dry prior to dressing wounds Anesthetic (Use 'Patient Medications' Section for Anesthetic Order Entry) Lidocaine applied to wound bed Off-Loading Total Contact Cast to Right Lower Extremity Wound Treatment Wound #2 - Foot Wound Laterality: Plantar, Right Cleanser: Soap and Water 2 x Per Week/30 Days Discharge Instructions: Gently cleanse wound with antibacterial soap, rinse and pat dry prior to dressing wounds [name], [name] ([id number]) [id number] Page 5 of 9 Prim Dressing: Hydrofera Blue Ready Transfer Foam, 2.5x2.5 (in/in) (Dispense As Written) 2 x Per Week/30 Days ary Discharge Instructions: Apply Hydrofera Blue Ready to wound bed as directed Secondary Dressing: ABD Pad 5x9 (in/in) 2 x Per Week/30 Days Discharge Instructions: Cover with ABD pad Electronic Signature(s) Signed: 04/22/2023 4:49:45 PM By: Allen Derry PA-C Signed: 05/12/2023 8:01:26 AM By: Yevonne Pax RN Entered  By: Yevonne Pax on 04/22/2023 12:32:40 -------------------------------------------------------------------------------- Problem List Details Patient Name: Date of Service: [name] HN M. 04/22/2023 3:15 PM Medical Record Number: [id number] Patient Account Number: [id number] Date of Birth/Sex: Treating RN: 1961/05/03 (62 y.o. Lina Sar, Lyla Son Primary Care Provider: [name] Other Clinician: Referring Provider: Treating Provider/Extender: [name] in Treatment: 7 Active Problems ICD-10 Encounter Code Description Active Date MDM Diagnosis E11.621 Type 2 diabetes mellitus with foot ulcer 03/04/2023 No Yes L97.512 Non-pressure chronic ulcer of other part of right foot with fat layer exposed 03/04/2023 No Yes I73.89 Other specified peripheral vascular diseases 03/04/2023 No Yes N18.4 Chronic kidney disease, stage 4 (severe) 03/04/2023 No Yes I10 Essential (primary) hypertension 03/04/2023 No Yes Inactive Problems Resolved Problems Electronic Signature(s) Signed: 04/22/2023 3:25:23 PM By: Allen Derry PA-C Entered By: Allen Derry on 04/22/2023 12:25:23 [name] ([id number]) [id number] Page 6 of 9 -------------------------------------------------------------------------------- Progress Note Details Patient Name: Date of Service: Harold Mote. 04/22/2023 3:15 PM Medical Record Number: [id number] Patient Account Number: [id number] Date of Birth/Sex: Treating RN: [birth year] (62 y.o. [name]) Yevonne Pax Primary Care Provider: [name] Other Clinician: Referring Provider: Treating Provider/Extender: Larina Bras,

## 2023-04-24 ENCOUNTER — Ambulatory Visit: Payer: Medicaid Other | Admitting: Physician Assistant

## 2023-04-24 ENCOUNTER — Encounter: Payer: Medicaid Other | Admitting: Physician Assistant

## 2023-04-24 DIAGNOSIS — N184 Chronic kidney disease, stage 4 (severe): Secondary | ICD-10-CM | POA: Diagnosis not present

## 2023-04-24 DIAGNOSIS — E1122 Type 2 diabetes mellitus with diabetic chronic kidney disease: Secondary | ICD-10-CM | POA: Diagnosis not present

## 2023-04-24 DIAGNOSIS — L97512 Non-pressure chronic ulcer of other part of right foot with fat layer exposed: Secondary | ICD-10-CM | POA: Diagnosis not present

## 2023-04-24 DIAGNOSIS — I129 Hypertensive chronic kidney disease with stage 1 through stage 4 chronic kidney disease, or unspecified chronic kidney disease: Secondary | ICD-10-CM | POA: Diagnosis not present

## 2023-04-24 DIAGNOSIS — E1151 Type 2 diabetes mellitus with diabetic peripheral angiopathy without gangrene: Secondary | ICD-10-CM | POA: Diagnosis not present

## 2023-04-24 DIAGNOSIS — E11622 Type 2 diabetes mellitus with other skin ulcer: Secondary | ICD-10-CM | POA: Diagnosis not present

## 2023-04-24 DIAGNOSIS — E11621 Type 2 diabetes mellitus with foot ulcer: Secondary | ICD-10-CM | POA: Diagnosis not present

## 2023-04-24 NOTE — Progress Notes (Addendum)
Orders: No Diagnosis Coding ICD-10 Coding Code Description E11.621 Type 2 diabetes mellitus with foot ulcer L97.512 Non-pressure chronic ulcer of other part of right foot with fat layer exposed I73.89 Other specified peripheral vascular diseases N18.4 Chronic kidney disease, stage 4 (severe) I10 Essential (primary) hypertension XABIER, ROSENMAN ([id number]) [id number] Page 4 of 9 Follow-up Appointments Return Appointment in 1 week. Bathing/ Shower/ Hygiene May shower; gently cleanse wound with antibacterial soap, rinse and pat dry prior to dressing wounds Anesthetic (Use 'Patient Medications' Section for Anesthetic Order Entry) Lidocaine applied to wound bed Off-Loading Total Contact Cast to Right Lower Extremity Wound Treatment Wound #2 - Foot Wound Laterality: Plantar, Right Cleanser: Soap and Water 2 x Per Week/30 Days Discharge Instructions: Gently cleanse wound with antibacterial soap, rinse and pat dry prior to dressing wounds Prim Dressing: Hydrofera Blue Ready Transfer Foam, 2.5x2.5 (in/in) (Dispense As Written) 2 x Per Week/30 Days ary Discharge Instructions:  Apply Hydrofera Blue Ready to wound bed as directed Secondary Dressing: ABD Pad 5x9 (in/in) 2 x Per Week/30 Days Discharge Instructions: Cover with ABD pad Electronic Signature(s) Signed: 04/24/2023 4:17:03 PM By: Allen Derry PA-C Signed: 05/12/2023 8:01:26 AM By: Yevonne Pax RN Entered By: Yevonne Pax on 04/24/2023 10:42:22 -------------------------------------------------------------------------------- Problem List Details Patient Name: Date of Service: [name] HN M. 04/24/2023 1:30 PM Medical Record Number: [id number] Patient Account Number: [id number] Date of Birth/Sex: Treating RN: 18-Apr-1961 (62 y.o. [name] Primary Care Provider: [name] Other Clinician: Referring Provider: Treating Provider/Extender: [name] in Treatment: 7 Active Problems ICD-10 Encounter Code Description Active Date MDM Diagnosis E11.621 Type 2 diabetes mellitus with foot ulcer 03/04/2023 No Yes L97.512 Non-pressure chronic ulcer of other part of right foot with fat layer exposed 03/04/2023 No Yes I73.89 Other specified peripheral vascular diseases 03/04/2023 No Yes N18.4 Chronic kidney disease, stage 4 (severe) 03/04/2023 No Yes I10 Essential (primary) hypertension 03/04/2023 No Yes GRAYSON, SUHRE ([id number]) [id number] Page 5 of 9 Inactive Problems Resolved Problems Electronic Signature(s) Signed: 04/24/2023 1:21:02 PM By: Allen Derry PA-C Entered By: Allen Derry on 04/24/2023 10:21:02 -------------------------------------------------------------------------------- Progress Note Details Patient Name: Date of Service: STRICKLA ND, [name] Chapel HN M. 04/24/2023 1:30 PM Medical Record Number: [id number] Patient Account Number: [id number] Date of Birth/Sex: Treating RN: 12/03/1960 (62 y.o. [name]) Yevonne Pax Primary Care Provider: [name] Other Clinician: Referring Provider: Treating Provider/Extender: [name] in Treatment: 7 Subjective Chief Complaint Information obtained from Patient Right plantar foot ulcer History of Present Illness (HPI) Chronic/Inactive Condition: 03-04-2023 patient's arterial screening today actually appears to show some signs of being somewhat low at 0.83 on the left 1.53 on the right which is essentially noncompressible. I really do not think we can assure ourselves of these readings and I think he really should see vascular for a thorough arterial evaluation in order to ensure that he has good blood flow although I suspect he probably does. 07/20/17 on evaluation today patient actually appears to be doing somewhat poorly in regard to his right plantar great toe ulcer. This is something that he states started as what he thought to be a blister May 04, 2018. Subsequently he treated this on his own for a while thinking that it would just get better. As it did not get better he eventually did go to have this evaluated where it was treated appropriately with it sounds like Bactroban ointment however the patient states that it never completely resolved. Subsequently if this continue to get worse  Date of Birth/Sex: Treating RN: 08/16/1960 (62 y.o. [name]) Yevonne Pax Primary Care Provider: [name] Other Clinician: Referring Provider: Treating Provider/Extender: [name] in Treatment: 7 T Contact Cast Applied for Wound Assessment: otal Wound #2 Right,Plantar  Foot Performed By: Physician Allen Derry, PA-C Post Procedure Diagnosis Same as Pre-procedure Electronic Signature(s) Signed: 04/24/2023 4:17:03 PM By: Allen Derry PA-C Signed: 05/12/2023 8:01:26 AM By: Yevonne Pax RN Entered By: Yevonne Pax on 04/24/2023 10:42:57 -------------------------------------------------------------------------------- SuperBill Details Patient Name: Date of Service: [name] M. 04/24/2023 Medical Record Number: [id number] Patient Account Number: [id number] Date of Birth/Sex: Treating RN: 1961-02-20 (62 y.o. 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 I73.89 Other specified peripheral vascular diseases N18.4 Chronic kidney disease, stage 4 (severe) I10 Essential (primary) hypertension Facility Procedures : CPT4 Code: 10272536 Description: [phone] - APPLY TOTAL CONTACT LEG CAST ICD-10 Diagnosis Description E11.621 Type 2 diabetes mellitus with foot ulcer Modifier: Quantity: 1 Physician Procedures : CPT4 Code Description Modifier [id number] - WC PHYS LEVEL 3 - EST PT ICD-10 Diagnosis Description E11.621 Type 2 diabetes mellitus with foot ulcer L97.512 Non-pressure chronic ulcer of other part of right foot with fat layer exposed I73.89 Other  specified peripheral vascular diseases N18.4 Chronic kidney disease, stage 4 (severe) Quantity: 1 : [phone] [id number] - WC PHYS APPLY TOTAL CONTACT CAST ICD-10 Diagnosis Description MELBERN, CEFALO ([id number]) [id number] E11.621 Type 2 diabetes mellitus with foot ulcer Quantity: 1 Page 9 of 9 Electronic Signature(s) Signed: 04/24/2023 4:01:09 PM By: Yevonne Pax  RN Signed: 04/24/2023 4:17:03 PM By: Allen Derry PA-C Previous Signature: 04/24/2023 1:50:04 PM Version By: Allen Derry PA-C Entered By: Yevonne Pax on 04/24/2023 13:01:09  MASTON, VANETTEN ([id number]) [id number] Page 1 of 9 Visit Report for 04/24/2023 Chief Complaint Document Details Patient Name: Date of Service: [name] [facility] M. 04/24/2023 1:30 PM Medical Record Number: [id number] Patient Account Number: [id number] Date of Birth/Sex: Treating RN: 12-Nov-1960 (62 y.o. [name]) Yevonne Pax Primary Care Provider: [name] Other Clinician: Referring Provider: Treating Provider/Extender: [name] in Treatment: 7 Information Obtained from: Patient Chief Complaint Right plantar foot ulcer Electronic Signature(s) Signed: 04/24/2023 1:21:08 PM By: Allen Derry PA-C Entered By: Allen Derry on 04/24/2023 10:21:08 -------------------------------------------------------------------------------- HPI Details Patient Name: Date of Service: [name] HN M. 04/24/2023 1:30 PM Medical Record Number: [id number] Patient Account Number: [id number] Date of Birth/Sex: Treating RN: 06-Mar-1961 (62 y.o. [name]) Yevonne Pax Primary Care Provider: [name] Other Clinician: Referring Provider: Treating Provider/Extender: [name] in Treatment: 7 History of Present Illness Chronic/Inactive Conditions Condition 1: 03-04-2023 patient's arterial screening today actually appears to show some signs of being somewhat low at 0.83 on the left 1.53 on the right which is essentially noncompressible. I really do not think we can assure ourselves of these readings and I think he really should see vascular for a thorough arterial evaluation in order to ensure that he has good blood flow although I suspect he probably does. HPI Description: 07/20/17 on evaluation today patient actually appears to be doing somewhat poorly in regard to his right plantar great toe ulcer. This is something that he states started as what he thought to be a blister May 04, 2018. Subsequently he  treated this on his own for a while thinking that it would just get better. As it did not get better he eventually did go to have this evaluated where it was treated appropriately with it sounds like Bactroban ointment however the patient states that it never completely resolved. Subsequently if this continue to get worse and was looking more irritated and inflamed he did go to the hospital more recently where he was prescribed Keflex. Subsequently he was also referred to the wound care center here for further evaluation by [place]. He had an x-ray in the ER which revealed no evidence of osteomyelitis that I explained to the patient that osteomyelitis can be present even in a negative x- ray as it's not nearly as sensitive as an MRI would be. No fevers, chills, nausea, or vomiting noted at this time. The patient tells me that this might look a little bit better since he started the antibiotics. He does have a history of diabetes mellitus type II he also had a horse accident years ago and had to have significant surgery in regard to his right lower extremity that did leave some deformity in this regard. This is mainly centered in the shin region. 07/27/18 upon evaluation today patient appears to be doing fairly well in regard to his toe ulcer all things considered. 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 This obviously is good news. With that being said I do think that he did have some infection which obviously calls some of the necrosis although the wound seems to be doing DEACON, NICOLOSI ([id number]) [id number] Page 2 of 9 better he is definitely tolerating the Cipro which I switched into based on the bone culture and seems to be dramatically improving in this regard. Overall I'm pleased with how  Date of Birth/Sex: Treating RN: 08/16/1960 (62 y.o. [name]) Yevonne Pax Primary Care Provider: [name] Other Clinician: Referring Provider: Treating Provider/Extender: [name] in Treatment: 7 T Contact Cast Applied for Wound Assessment: otal Wound #2 Right,Plantar  Foot Performed By: Physician Allen Derry, PA-C Post Procedure Diagnosis Same as Pre-procedure Electronic Signature(s) Signed: 04/24/2023 4:17:03 PM By: Allen Derry PA-C Signed: 05/12/2023 8:01:26 AM By: Yevonne Pax RN Entered By: Yevonne Pax on 04/24/2023 10:42:57 -------------------------------------------------------------------------------- SuperBill Details Patient Name: Date of Service: [name] M. 04/24/2023 Medical Record Number: [id number] Patient Account Number: [id number] Date of Birth/Sex: Treating RN: 1961-02-20 (62 y.o. 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 I73.89 Other specified peripheral vascular diseases N18.4 Chronic kidney disease, stage 4 (severe) I10 Essential (primary) hypertension Facility Procedures : CPT4 Code: 10272536 Description: [phone] - APPLY TOTAL CONTACT LEG CAST ICD-10 Diagnosis Description E11.621 Type 2 diabetes mellitus with foot ulcer Modifier: Quantity: 1 Physician Procedures : CPT4 Code Description Modifier [id number] 99213 - WC PHYS LEVEL 3 - EST PT ICD-10 Diagnosis Description E11.621 Type 2 diabetes mellitus with foot ulcer L97.512 Non-pressure chronic ulcer of other part of right foot with fat layer exposed I73.89 Other  specified peripheral vascular diseases N18.4 Chronic kidney disease, stage 4 (severe) Quantity: 1 : [phone] [id number] - WC PHYS APPLY TOTAL CONTACT CAST ICD-10 Diagnosis Description MELBERN, CEFALO ([id number]) [id number] E11.621 Type 2 diabetes mellitus with foot ulcer Quantity: 1 Page 9 of 9 Electronic Signature(s) Signed: 04/24/2023 4:01:09 PM By: Yevonne Pax  RN Signed: 04/24/2023 4:17:03 PM By: Allen Derry PA-C Previous Signature: 04/24/2023 1:50:04 PM Version By: Allen Derry PA-C Entered By: Yevonne Pax on 04/24/2023 13:01:09  Orders: No Diagnosis Coding ICD-10 Coding Code Description E11.621 Type 2 diabetes mellitus with foot ulcer L97.512 Non-pressure chronic ulcer of other part of right foot with fat layer exposed I73.89 Other specified peripheral vascular diseases N18.4 Chronic kidney disease, stage 4 (severe) I10 Essential (primary) hypertension XABIER, ROSENMAN ([id number]) [id number] Page 4 of 9 Follow-up Appointments Return Appointment in 1 week. Bathing/ Shower/ Hygiene May shower; gently cleanse wound with antibacterial soap, rinse and pat dry prior to dressing wounds Anesthetic (Use 'Patient Medications' Section for Anesthetic Order Entry) Lidocaine applied to wound bed Off-Loading Total Contact Cast to Right Lower Extremity Wound Treatment Wound #2 - Foot Wound Laterality: Plantar, Right Cleanser: Soap and Water 2 x Per Week/30 Days Discharge Instructions: Gently cleanse wound with antibacterial soap, rinse and pat dry prior to dressing wounds Prim Dressing: Hydrofera Blue Ready Transfer Foam, 2.5x2.5 (in/in) (Dispense As Written) 2 x Per Week/30 Days ary Discharge Instructions:  Apply Hydrofera Blue Ready to wound bed as directed Secondary Dressing: ABD Pad 5x9 (in/in) 2 x Per Week/30 Days Discharge Instructions: Cover with ABD pad Electronic Signature(s) Signed: 04/24/2023 4:17:03 PM By: Allen Derry PA-C Signed: 05/12/2023 8:01:26 AM By: Yevonne Pax RN Entered By: Yevonne Pax on 04/24/2023 10:42:22 -------------------------------------------------------------------------------- Problem List Details Patient Name: Date of Service: [name] HN M. 04/24/2023 1:30 PM Medical Record Number: [id number] Patient Account Number: [id number] Date of Birth/Sex: Treating RN: 18-Apr-1961 (62 y.o. [name] Primary Care Provider: [name] Other Clinician: Referring Provider: Treating Provider/Extender: [name] in Treatment: 7 Active Problems ICD-10 Encounter Code Description Active Date MDM Diagnosis E11.621 Type 2 diabetes mellitus with foot ulcer 03/04/2023 No Yes L97.512 Non-pressure chronic ulcer of other part of right foot with fat layer exposed 03/04/2023 No Yes I73.89 Other specified peripheral vascular diseases 03/04/2023 No Yes N18.4 Chronic kidney disease, stage 4 (severe) 03/04/2023 No Yes I10 Essential (primary) hypertension 03/04/2023 No Yes GRAYSON, SUHRE ([id number]) [id number] Page 5 of 9 Inactive Problems Resolved Problems Electronic Signature(s) Signed: 04/24/2023 1:21:02 PM By: Allen Derry PA-C Entered By: Allen Derry on 04/24/2023 10:21:02 -------------------------------------------------------------------------------- Progress Note Details Patient Name: Date of Service: STRICKLA ND, [name] Chapel HN M. 04/24/2023 1:30 PM Medical Record Number: [id number] Patient Account Number: [id number] Date of Birth/Sex: Treating RN: 12/03/1960 (62 y.o. [name]) Yevonne Pax Primary Care Provider: [name] Other Clinician: Referring Provider: Treating Provider/Extender: [name] in Treatment: 7 Subjective Chief Complaint Information obtained from Patient Right plantar foot ulcer History of Present Illness (HPI) Chronic/Inactive Condition: 03-04-2023 patient's arterial screening today actually appears to show some signs of being somewhat low at 0.83 on the left 1.53 on the right which is essentially noncompressible. I really do not think we can assure ourselves of these readings and I think he really should see vascular for a thorough arterial evaluation in order to ensure that he has good blood flow although I suspect he probably does. 07/20/17 on evaluation today patient actually appears to be doing somewhat poorly in regard to his right plantar great toe ulcer. This is something that he states started as what he thought to be a blister May 04, 2018. Subsequently he treated this on his own for a while thinking that it would just get better. As it did not get better he eventually did go to have this evaluated where it was treated appropriately with it sounds like Bactroban ointment however the patient states that it never completely resolved. Subsequently if this continue to get worse  am pretty pleased with where we are at although I did tell him that if he does not continue to see ongoing size improvements that we may want to consider a total contact cast. 03-27-2023 upon evaluation today patient appears to be doing well currently in regard to his wound. Has been tolerating the dressing changes without complication. Fortunately I do not see any evidence of worsening or infection overall. I am very pleased with where we stand today. 04-03-2023 upon evaluation today patient appears to be doing well currently in regard to his wound on the foot. Fortunately I do not see any signs of active infection locally or systemically at this time. With that being said the wound is getting very tiny and I think he is doing quite well. 04-10-2023 upon evaluation today patient appears to be doing well currently in regard to his wound. This is going require some debridement but does seem to be making progress towards closure which is good news. 04-17-2023 upon evaluation today patient appears to be doing well currently in regard to his foot ulcer although it is not making as much progress as what we would like to see. I do believe that  he likely is going to need to be more aggressive in the treatment of this and specifically I think that he probably needs a total contact cast. 04-22-2023 upon evaluation today patient appears to be doing well currently in regard to his wound. He has been tolerating the dressing changes without complication. Fortunately I do not see any signs of infection the wound is about the same size it was last week there is some question is whether or not it might be healed or not but it appears he had more callus buildup and it does not appear to be healed at this point. Fortunately I do not see any signs of infection locally nor systemically which is good news. 04-24-2023 upon evaluation today patient appears to be doing well currently in regard to his wound. We did the total contact casting and that seems to be doing very well for him. Fortunately I do not see any signs of active infection locally or systemically which is great news. Electronic Signature(s) Signed: 04/24/2023 1:47:59 PM By: Allen Derry PA-C Entered By: Allen Derry on 04/24/2023 10:47:59 [name] ([id number]) [id number] Page 3 of 9 -------------------------------------------------------------------------------- Physical Exam Details Patient Name: Date of Service: [name] 04/24/2023 1:30 PM Medical Record Number: [id number] Patient Account Number: [id number] Date of Birth/Sex: Treating RN: 1961/03/15 (62 y.o. [name] Primary Care Provider: [name] Other Clinician: Referring Provider: Treating Provider/Extender: [name] in Treatment: 7 Constitutional Well-nourished and well-hydrated in no acute distress. Respiratory normal breathing without difficulty. Psychiatric this patient is able to make decisions and demonstrates good insight into disease process. Alert and Oriented x 3. pleasant and cooperative. Notes Upon inspection  patient's wound bed actually showed signs of good granulation and epithelization at this point. Fortunately I do not see any signs of infection at this time which is good news. Electronic Signature(s) Signed: 04/24/2023 1:48:55 PM By: Allen Derry PA-C Entered By: Allen Derry on 04/24/2023 10:48:54 -------------------------------------------------------------------------------- Physician Orders Details Patient Name: Date of Service: [name] HN M. 04/24/2023 1:30 PM Medical Record Number: [id number] Patient Account Number: [id number] Date of Birth/Sex: Treating RN: March 16, 1961 (62 y.o. [name] Primary Care Provider: [name] Other Clinician: Referring Provider: Treating Provider/Extender: [name] in Treatment: 7 Verbal / Phone  am pretty pleased with where we are at although I did tell him that if he does not continue to see ongoing size improvements that we may want to consider a total contact cast. 03-27-2023 upon evaluation today patient appears to be doing well currently in regard to his wound. Has been tolerating the dressing changes without complication. Fortunately I do not see any evidence of worsening or infection overall. I am very pleased with where we stand today. 04-03-2023 upon evaluation today patient appears to be doing well currently in regard to his wound on the foot. Fortunately I do not see any signs of active infection locally or systemically at this time. With that being said the wound is getting very tiny and I think he is doing quite well. 04-10-2023 upon evaluation today patient appears to be doing well currently in regard to his wound. This is going require some debridement but does seem to be making progress towards closure which is good news. 04-17-2023 upon evaluation today patient appears to be doing well currently in regard to his foot ulcer although it is not making as much progress as what we would like to see. I do believe that  he likely is going to need to be more aggressive in the treatment of this and specifically I think that he probably needs a total contact cast. 04-22-2023 upon evaluation today patient appears to be doing well currently in regard to his wound. He has been tolerating the dressing changes without complication. Fortunately I do not see any signs of infection the wound is about the same size it was last week there is some question is whether or not it might be healed or not but it appears he had more callus buildup and it does not appear to be healed at this point. Fortunately I do not see any signs of infection locally nor systemically which is good news. 04-24-2023 upon evaluation today patient appears to be doing well currently in regard to his wound. We did the total contact casting and that seems to be doing very well for him. Fortunately I do not see any signs of active infection locally or systemically which is great news. Electronic Signature(s) Signed: 04/24/2023 1:47:59 PM By: Allen Derry PA-C Entered By: Allen Derry on 04/24/2023 10:47:59 [name] ([id number]) [id number] Page 3 of 9 -------------------------------------------------------------------------------- Physical Exam Details Patient Name: Date of Service: [name] 04/24/2023 1:30 PM Medical Record Number: [id number] Patient Account Number: [id number] Date of Birth/Sex: Treating RN: 1961/03/15 (62 y.o. [name] Primary Care Provider: [name] Other Clinician: Referring Provider: Treating Provider/Extender: [name] in Treatment: 7 Constitutional Well-nourished and well-hydrated in no acute distress. Respiratory normal breathing without difficulty. Psychiatric this patient is able to make decisions and demonstrates good insight into disease process. Alert and Oriented x 3. pleasant and cooperative. Notes Upon inspection  patient's wound bed actually showed signs of good granulation and epithelization at this point. Fortunately I do not see any signs of infection at this time which is good news. Electronic Signature(s) Signed: 04/24/2023 1:48:55 PM By: Allen Derry PA-C Entered By: Allen Derry on 04/24/2023 10:48:54 -------------------------------------------------------------------------------- Physician Orders Details Patient Name: Date of Service: [name] HN M. 04/24/2023 1:30 PM Medical Record Number: [id number] Patient Account Number: [id number] Date of Birth/Sex: Treating RN: March 16, 1961 (62 y.o. [name] Primary Care Provider: [name] Other Clinician: Referring Provider: Treating Provider/Extender: [name] in Treatment: 7 Verbal / Phone  MASTON, VANETTEN ([id number]) [id number] Page 1 of 9 Visit Report for 04/24/2023 Chief Complaint Document Details Patient Name: Date of Service: [name] [facility] M. 04/24/2023 1:30 PM Medical Record Number: [id number] Patient Account Number: [id number] Date of Birth/Sex: Treating RN: 12-Nov-1960 (62 y.o. [name]) Yevonne Pax Primary Care Provider: [name] Other Clinician: Referring Provider: Treating Provider/Extender: [name] in Treatment: 7 Information Obtained from: Patient Chief Complaint Right plantar foot ulcer Electronic Signature(s) Signed: 04/24/2023 1:21:08 PM By: Allen Derry PA-C Entered By: Allen Derry on 04/24/2023 10:21:08 -------------------------------------------------------------------------------- HPI Details Patient Name: Date of Service: [name] HN M. 04/24/2023 1:30 PM Medical Record Number: [id number] Patient Account Number: [id number] Date of Birth/Sex: Treating RN: 06-Mar-1961 (62 y.o. [name]) Yevonne Pax Primary Care Provider: [name] Other Clinician: Referring Provider: Treating Provider/Extender: [name] in Treatment: 7 History of Present Illness Chronic/Inactive Conditions Condition 1: 03-04-2023 patient's arterial screening today actually appears to show some signs of being somewhat low at 0.83 on the left 1.53 on the right which is essentially noncompressible. I really do not think we can assure ourselves of these readings and I think he really should see vascular for a thorough arterial evaluation in order to ensure that he has good blood flow although I suspect he probably does. HPI Description: 07/20/17 on evaluation today patient actually appears to be doing somewhat poorly in regard to his right plantar great toe ulcer. This is something that he states started as what he thought to be a blister May 04, 2018. Subsequently he  treated this on his own for a while thinking that it would just get better. As it did not get better he eventually did go to have this evaluated where it was treated appropriately with it sounds like Bactroban ointment however the patient states that it never completely resolved. Subsequently if this continue to get worse and was looking more irritated and inflamed he did go to the hospital more recently where he was prescribed Keflex. Subsequently he was also referred to the wound care center here for further evaluation by [place]. He had an x-ray in the ER which revealed no evidence of osteomyelitis that I explained to the patient that osteomyelitis can be present even in a negative x- ray as it's not nearly as sensitive as an MRI would be. No fevers, chills, nausea, or vomiting noted at this time. The patient tells me that this might look a little bit better since he started the antibiotics. He does have a history of diabetes mellitus type II he also had a horse accident years ago and had to have significant surgery in regard to his right lower extremity that did leave some deformity in this regard. This is mainly centered in the shin region. 07/27/18 upon evaluation today patient appears to be doing fairly well in regard to his toe ulcer all things considered. 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 This obviously is good news. With that being said I do think that he did have some infection which obviously calls some of the necrosis although the wound seems to be doing DEACON, NICOLOSI ([id number]) [id number] Page 2 of 9 better he is definitely tolerating the Cipro which I switched into based on the bone culture and seems to be dramatically improving in this regard. Overall I'm pleased with how  Orders: No Diagnosis Coding ICD-10 Coding Code Description E11.621 Type 2 diabetes mellitus with foot ulcer L97.512 Non-pressure chronic ulcer of other part of right foot with fat layer exposed I73.89 Other specified peripheral vascular diseases N18.4 Chronic kidney disease, stage 4 (severe) I10 Essential (primary) hypertension XABIER, ROSENMAN ([id number]) [id number] Page 4 of 9 Follow-up Appointments Return Appointment in 1 week. Bathing/ Shower/ Hygiene May shower; gently cleanse wound with antibacterial soap, rinse and pat dry prior to dressing wounds Anesthetic (Use 'Patient Medications' Section for Anesthetic Order Entry) Lidocaine applied to wound bed Off-Loading Total Contact Cast to Right Lower Extremity Wound Treatment Wound #2 - Foot Wound Laterality: Plantar, Right Cleanser: Soap and Water 2 x Per Week/30 Days Discharge Instructions: Gently cleanse wound with antibacterial soap, rinse and pat dry prior to dressing wounds Prim Dressing: Hydrofera Blue Ready Transfer Foam, 2.5x2.5 (in/in) (Dispense As Written) 2 x Per Week/30 Days ary Discharge Instructions:  Apply Hydrofera Blue Ready to wound bed as directed Secondary Dressing: ABD Pad 5x9 (in/in) 2 x Per Week/30 Days Discharge Instructions: Cover with ABD pad Electronic Signature(s) Signed: 04/24/2023 4:17:03 PM By: Allen Derry PA-C Signed: 05/12/2023 8:01:26 AM By: Yevonne Pax RN Entered By: Yevonne Pax on 04/24/2023 10:42:22 -------------------------------------------------------------------------------- Problem List Details Patient Name: Date of Service: [name] HN M. 04/24/2023 1:30 PM Medical Record Number: [id number] Patient Account Number: [id number] Date of Birth/Sex: Treating RN: 18-Apr-1961 (62 y.o. [name] Primary Care Provider: [name] Other Clinician: Referring Provider: Treating Provider/Extender: [name] in Treatment: 7 Active Problems ICD-10 Encounter Code Description Active Date MDM Diagnosis E11.621 Type 2 diabetes mellitus with foot ulcer 03/04/2023 No Yes L97.512 Non-pressure chronic ulcer of other part of right foot with fat layer exposed 03/04/2023 No Yes I73.89 Other specified peripheral vascular diseases 03/04/2023 No Yes N18.4 Chronic kidney disease, stage 4 (severe) 03/04/2023 No Yes I10 Essential (primary) hypertension 03/04/2023 No Yes GRAYSON, SUHRE ([id number]) [id number] Page 5 of 9 Inactive Problems Resolved Problems Electronic Signature(s) Signed: 04/24/2023 1:21:02 PM By: Allen Derry PA-C Entered By: Allen Derry on 04/24/2023 10:21:02 -------------------------------------------------------------------------------- Progress Note Details Patient Name: Date of Service: STRICKLA ND, [name] Chapel HN M. 04/24/2023 1:30 PM Medical Record Number: [id number] Patient Account Number: [id number] Date of Birth/Sex: Treating RN: 12/03/1960 (62 y.o. [name]) Yevonne Pax Primary Care Provider: [name] Other Clinician: Referring Provider: Treating Provider/Extender: [name] in Treatment: 7 Subjective Chief Complaint Information obtained from Patient Right plantar foot ulcer History of Present Illness (HPI) Chronic/Inactive Condition: 03-04-2023 patient's arterial screening today actually appears to show some signs of being somewhat low at 0.83 on the left 1.53 on the right which is essentially noncompressible. I really do not think we can assure ourselves of these readings and I think he really should see vascular for a thorough arterial evaluation in order to ensure that he has good blood flow although I suspect he probably does. 07/20/17 on evaluation today patient actually appears to be doing somewhat poorly in regard to his right plantar great toe ulcer. This is something that he states started as what he thought to be a blister May 04, 2018. Subsequently he treated this on his own for a while thinking that it would just get better. As it did not get better he eventually did go to have this evaluated where it was treated appropriately with it sounds like Bactroban ointment however the patient states that it never completely resolved. Subsequently if this continue to get worse  MASTON, VANETTEN ([id number]) [id number] Page 1 of 9 Visit Report for 04/24/2023 Chief Complaint Document Details Patient Name: Date of Service: [name] [facility] M. 04/24/2023 1:30 PM Medical Record Number: [id number] Patient Account Number: [id number] Date of Birth/Sex: Treating RN: 12-Nov-1960 (62 y.o. [name]) Yevonne Pax Primary Care Provider: [name] Other Clinician: Referring Provider: Treating Provider/Extender: [name] in Treatment: 7 Information Obtained from: Patient Chief Complaint Right plantar foot ulcer Electronic Signature(s) Signed: 04/24/2023 1:21:08 PM By: Allen Derry PA-C Entered By: Allen Derry on 04/24/2023 10:21:08 -------------------------------------------------------------------------------- HPI Details Patient Name: Date of Service: [name] HN M. 04/24/2023 1:30 PM Medical Record Number: [id number] Patient Account Number: [id number] Date of Birth/Sex: Treating RN: 06-Mar-1961 (62 y.o. [name]) Yevonne Pax Primary Care Provider: [name] Other Clinician: Referring Provider: Treating Provider/Extender: [name] in Treatment: 7 History of Present Illness Chronic/Inactive Conditions Condition 1: 03-04-2023 patient's arterial screening today actually appears to show some signs of being somewhat low at 0.83 on the left 1.53 on the right which is essentially noncompressible. I really do not think we can assure ourselves of these readings and I think he really should see vascular for a thorough arterial evaluation in order to ensure that he has good blood flow although I suspect he probably does. HPI Description: 07/20/17 on evaluation today patient actually appears to be doing somewhat poorly in regard to his right plantar great toe ulcer. This is something that he states started as what he thought to be a blister May 04, 2018. Subsequently he  treated this on his own for a while thinking that it would just get better. As it did not get better he eventually did go to have this evaluated where it was treated appropriately with it sounds like Bactroban ointment however the patient states that it never completely resolved. Subsequently if this continue to get worse and was looking more irritated and inflamed he did go to the hospital more recently where he was prescribed Keflex. Subsequently he was also referred to the wound care center here for further evaluation by [place]. He had an x-ray in the ER which revealed no evidence of osteomyelitis that I explained to the patient that osteomyelitis can be present even in a negative x- ray as it's not nearly as sensitive as an MRI would be. No fevers, chills, nausea, or vomiting noted at this time. The patient tells me that this might look a little bit better since he started the antibiotics. He does have a history of diabetes mellitus type II he also had a horse accident years ago and had to have significant surgery in regard to his right lower extremity that did leave some deformity in this regard. This is mainly centered in the shin region. 07/27/18 upon evaluation today patient appears to be doing fairly well in regard to his toe ulcer all things considered. 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 This obviously is good news. With that being said I do think that he did have some infection which obviously calls some of the necrosis although the wound seems to be doing DEACON, NICOLOSI ([id number]) [id number] Page 2 of 9 better he is definitely tolerating the Cipro which I switched into based on the bone culture and seems to be dramatically improving in this regard. Overall I'm pleased with how

## 2023-04-28 ENCOUNTER — Emergency Department
Admission: EM | Admit: 2023-04-28 | Discharge: 2023-04-28 | Discharge status: Against Medical Advice | Payer: Medicaid Other | Attending: Emergency Medicine | Admitting: Emergency Medicine

## 2023-04-28 ENCOUNTER — Other Ambulatory Visit: Payer: Self-pay

## 2023-04-28 DIAGNOSIS — Z4689 Encounter for fitting and adjustment of other specified devices: Secondary | ICD-10-CM | POA: Diagnosis not present

## 2023-04-28 DIAGNOSIS — E119 Type 2 diabetes mellitus without complications: Secondary | ICD-10-CM | POA: Insufficient documentation

## 2023-04-28 DIAGNOSIS — Z5321 Procedure and treatment not carried out due to patient leaving prior to being seen by health care provider: Secondary | ICD-10-CM | POA: Diagnosis not present

## 2023-04-28 NOTE — ED Triage Notes (Signed)
Pt has hx DM, pt was seen by wound DR on 10/10, pt reports small area on foot where he had a healing wound and at his last appointment the DR placed pt in a boot to promote further healing, pt c/o irritation from boot and wants boot removed. Denying any complications with wound or infection, just wants boot removed.

## 2023-04-29 ENCOUNTER — Encounter: Payer: Medicaid Other | Admitting: Physician Assistant

## 2023-04-29 DIAGNOSIS — N184 Chronic kidney disease, stage 4 (severe): Secondary | ICD-10-CM | POA: Diagnosis not present

## 2023-04-29 DIAGNOSIS — E1122 Type 2 diabetes mellitus with diabetic chronic kidney disease: Secondary | ICD-10-CM | POA: Diagnosis not present

## 2023-04-29 DIAGNOSIS — E1151 Type 2 diabetes mellitus with diabetic peripheral angiopathy without gangrene: Secondary | ICD-10-CM | POA: Diagnosis not present

## 2023-04-29 DIAGNOSIS — E11621 Type 2 diabetes mellitus with foot ulcer: Secondary | ICD-10-CM | POA: Diagnosis not present

## 2023-04-29 DIAGNOSIS — I129 Hypertensive chronic kidney disease with stage 1 through stage 4 chronic kidney disease, or unspecified chronic kidney disease: Secondary | ICD-10-CM | POA: Diagnosis not present

## 2023-04-29 DIAGNOSIS — L97512 Non-pressure chronic ulcer of other part of right foot with fat layer exposed: Secondary | ICD-10-CM | POA: Diagnosis not present

## 2023-04-29 DIAGNOSIS — E11622 Type 2 diabetes mellitus with other skin ulcer: Secondary | ICD-10-CM | POA: Diagnosis not present

## 2023-04-29 NOTE — Progress Notes (Addendum)
[name], [name] ([id number]) [id number] Page 1 of 8 Visit Report for 04/29/2023 Chief Complaint Document Details Patient Name: Date of Service: [name] [facility] M. 04/29/2023 12:30 PM Medical Record Number: [id number] Patient Account Number: [id number] Date of Birth/Sex: Treating RN: May 29, 1961 (62 y.o. Harold Petit) Yevonne Pax Primary Care Provider: [name] Other Clinician: Referring Provider: Treating Provider/Extender: [name] in Treatment: 8 Information Obtained from: Patient Chief Complaint Right plantar foot ulcer Electronic Signature(s) Signed: 04/29/2023 12:53:59 PM By: Allen Derry PA-C Entered By: Allen Derry on 04/29/2023 09:53:59 -------------------------------------------------------------------------------- Debridement Details Patient Name: Date of Service: [name] HN M. 04/29/2023 12:30 PM Medical Record Number: [id number] Patient Account Number: [id number] Date of Birth/Sex: Treating RN: 1961-03-30 (62 y.o. [name] Primary Care Provider: [name] Other Clinician: Referring Provider: Treating Provider/Extender: [name] in Treatment: 8 Debridement Performed for Assessment: Wound #2 Right,Plantar Foot Performed By: Physician Allen Derry, PA-C Debridement Type: Debridement Severity of Tissue Pre Debridement: Fat layer exposed Level of Consciousness (Pre-procedure): Awake and Alert Pre-procedure Verification/Time Out Yes - 12:55 Taken: Start Time: 12:55 Percent of Wound Bed Debrided: 100% T Area Debrided (cm): otal 0.13 Tissue and other material debrided: Viable, Non-Viable, Callus, Subcutaneous, Skin: Dermis , Skin: Epidermis Level: Skin/Subcutaneous Tissue Debridement Description: Excisional Instrument: Curette Bleeding: Moderate Hemostasis Achieved: Silver Nitrate End Time: 12:59 Procedural Pain: 0 Post Procedural Pain:  0 [name], [name] ([id number]) [id number] Page 2 of 8 Response to Treatment: Procedure was tolerated well Level of Consciousness (Post- Awake and Alert procedure): Post Debridement Measurements of Total Wound Length: (cm) 0.4 Width: (cm) 0.4 Depth: (cm) 0.2 Volume: (cm) 0.025 Character of Wound/Ulcer Post Debridement: Improved Severity of Tissue Post Debridement: Fat layer exposed Post Procedure Diagnosis Same as Pre-procedure Electronic Signature(s) Signed: 04/29/2023 6:10:25 PM By: Allen Derry PA-C Signed: 05/02/2023 11:41:18 AM By: Yevonne Pax RN Entered By: Yevonne Pax on 04/29/2023 10:02:40 -------------------------------------------------------------------------------- HPI Details Patient Name: Date of Service: [name] HN M. 04/29/2023 12:30 PM Medical Record Number: [id number] Patient Account Number: [id number] Date of Birth/Sex: Treating RN: 10-10-1960 (62 y.o. [name] Primary Care Provider: [name] Other Clinician: Referring Provider: Treating Provider/Extender: [name] in Treatment: 8 History of Present Illness Chronic/Inactive Conditions Condition 1: 03-04-2023 patient's arterial screening today actually appears to show some signs of being somewhat low at 0.83 on the left 1.53 on the right which is essentially noncompressible. I really do not think we can assure ourselves of these readings and I think he really should see vascular for a thorough arterial evaluation in order to ensure that he has good blood flow although I suspect he probably does. HPI Description: 07/20/17 on evaluation today patient actually appears to be doing somewhat poorly in regard to his right plantar great toe ulcer. This is something that he states started as what he thought to be a blister May 04, 2018. Subsequently he treated this on his own for a while thinking that it would just get better. As it  did not get better he eventually did go to have this evaluated where it was treated appropriately with it sounds like Bactroban ointment however the patient states that it never completely resolved. Subsequently if this continue to get worse and was looking more irritated and inflamed he did go to the hospital more recently where he was prescribed Keflex. Subsequently he was also referred to the wound care center here for further evaluation by [place]. He had an x-ray  [name], [name] ([id number]) [id number] Page 1 of 8 Visit Report for 04/29/2023 Chief Complaint Document Details Patient Name: Date of Service: [name] [facility] 04/29/2023 12:30 PM Medical Record Number: [id number] Patient Account Number: [id number] Date of Birth/Sex: Treating RN: May 29, 1961 (62 y.o. Harold Petit) Yevonne Pax Primary Care Provider: [name] Other Clinician: Referring Provider: Treating Provider/Extender: [name] in Treatment: 8 Information Obtained from: Patient Chief Complaint Right plantar foot ulcer Electronic Signature(s) Signed: 04/29/2023 12:53:59 PM By: Allen Derry PA-C Entered By: Allen Derry on 04/29/2023 09:53:59 -------------------------------------------------------------------------------- Debridement Details Patient Name: Date of Service: [name] HN M. 04/29/2023 12:30 PM Medical Record Number: [id number] Patient Account Number: [id number] Date of Birth/Sex: Treating RN: 1961-03-30 (62 y.o. [name] Primary Care Provider: [name] Other Clinician: Referring Provider: Treating Provider/Extender: [name] in Treatment: 8 Debridement Performed for Assessment: Wound #2 Right,Plantar Foot Performed By: Physician Allen Derry, PA-C Debridement Type: Debridement Severity of Tissue Pre Debridement: Fat layer exposed Level of Consciousness (Pre-procedure): Awake and Alert Pre-procedure Verification/Time Out Yes - 12:55 Taken: Start Time: 12:55 Percent of Wound Bed Debrided: 100% T Area Debrided (cm): otal 0.13 Tissue and other material debrided: Viable, Non-Viable, Callus, Subcutaneous, Skin: Dermis , Skin: Epidermis Level: Skin/Subcutaneous Tissue Debridement Description: Excisional Instrument: Curette Bleeding: Moderate Hemostasis Achieved: Silver Nitrate End Time: 12:59 Procedural Pain: 0 Post Procedural Pain:  0 [name], [name] ([id number]) [id number] Page 2 of 8 Response to Treatment: Procedure was tolerated well Level of Consciousness (Post- Awake and Alert procedure): Post Debridement Measurements of Total Wound Length: (cm) 0.4 Width: (cm) 0.4 Depth: (cm) 0.2 Volume: (cm) 0.025 Character of Wound/Ulcer Post Debridement: Improved Severity of Tissue Post Debridement: Fat layer exposed Post Procedure Diagnosis Same as Pre-procedure Electronic Signature(s) Signed: 04/29/2023 6:10:25 PM By: Allen Derry PA-C Signed: 05/02/2023 11:41:18 AM By: Yevonne Pax RN Entered By: Yevonne Pax on 04/29/2023 10:02:40 -------------------------------------------------------------------------------- HPI Details Patient Name: Date of Service: [name] HN M. 04/29/2023 12:30 PM Medical Record Number: [id number] Patient Account Number: [id number] Date of Birth/Sex: Treating RN: 10-10-1960 (62 y.o. [name] Primary Care Provider: [name] Other Clinician: Referring Provider: Treating Provider/Extender: [name] in Treatment: 8 History of Present Illness Chronic/Inactive Conditions Condition 1: 03-04-2023 patient's arterial screening today actually appears to show some signs of being somewhat low at 0.83 on the left 1.53 on the right which is essentially noncompressible. I really do not think we can assure ourselves of these readings and I think he really should see vascular for a thorough arterial evaluation in order to ensure that he has good blood flow although I suspect he probably does. HPI Description: 07/20/17 on evaluation today patient actually appears to be doing somewhat poorly in regard to his right plantar great toe ulcer. This is something that he states started as what he thought to be a blister May 04, 2018. Subsequently he treated this on his own for a while thinking that it would just get better. As it  did not get better he eventually did go to have this evaluated where it was treated appropriately with it sounds like Bactroban ointment however the patient states that it never completely resolved. Subsequently if this continue to get worse and was looking more irritated and inflamed he did go to the hospital more recently where he was prescribed Keflex. Subsequently he was also referred to the wound care center here for further evaluation by [place]. He had an x-ray  Hydrofera Blue Ready to wound bed as directed Secondary Dressing: Coverlet Latex-Free Fabric Adhesive Dressings 3 x Per Week/30 Days Discharge Instructions: 1.5 x 2 1. I would recommend based on what I am seeing that we have the patient not use the total contact cast. I think that still was the best way to offload but unfortunately caused a lot of irritation therefore overnight and continued on that road. 2. I am going to recommend as well that the patient should continue with the [facility]. 3. I am also can recommend that a coverlet to go over top of this to secure in place and then subsequently he can continue with the front offloading shoe that we have given today I told him to not walk if he does not have to and when he does have to to make sure he has an offloading shoe on we discussed how to properly wear it without causing any additional damage to his foot. We will see patient back for reevaluation in 1 week here in the clinic. If anything worsens or changes patient will contact our office for additional recommendations. Electronic Signature(s) Signed: 04/29/2023 1:41:26 PM By: Allen Derry PA-C Entered By: Allen Derry on 04/29/2023 10:41:26 -------------------------------------------------------------------------------- SuperBill Details Patient Name: Date of Service: Harold Mould M. 04/29/2023 Medical Record  Number: [id number] Patient Account Number: [id number] Date of Birth/Sex: Treating RN: 1960-09-13 (62 y.o. 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 I73.89 Other specified peripheral vascular diseases N18.4 Chronic kidney disease, stage 4 (severe) I10 Essential (primary) hypertension Facility Procedures : CPT4 Code: [id number] Description: 11042 - DEB SUBQ TISSUE 20 SQ CM/< ICD-10 Diagnosis Description L97.512 Non-pressure chronic ulcer of other part of right foot with fat layer exposed Modifier: Quantity: 1 Physician Procedures : CPT4 Code Description Modifier [id number] 11042 - WC PHYS SUBQ TISS 20 SQ CM ICD-10 Diagnosis Description L97.512 Non-pressure chronic ulcer of other part of right foot with fat layer exposed [name] ([id number])  [id number] Page 8 Quantity: 1 of 8 Electronic Signature(s) Signed: 04/29/2023 1:41:49 PM By: Allen Derry PA-C Entered By: Allen Derry on 04/29/2023 10:41:49  in the ER which revealed no evidence of osteomyelitis that I explained to the patient that osteomyelitis can be present even in a negative x- ray as it's not nearly as sensitive as an MRI would be. No fevers, chills, nausea, or vomiting noted at this time. The patient tells me that this might look a little bit better since he started the antibiotics. He does have a history of diabetes mellitus type II he also had a horse accident years ago and had to have significant surgery in regard to his right lower extremity that did leave some deformity in this regard. This is mainly centered in the shin region. Readmission: 03-04-2023 upon evaluation today patient appears to be doing somewhat poorly today in regard to the wound on the right plantar foot. 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 With that being said the patient tells me at this point that he has been treating this just at home using some Betadine on the area but really nothing else has been done up to this point. This is actually his first office for his care is concerned in a clinic setting. Patient's past medical history actually is very similar to previous when I saw him. He does have a history of diabetes mellitus type 2, peripheral vascular disease, chronic kidney disease stage IV, hypertension, he has not had any formal arterial  testing up to this point. Patient is likely going require an x-ray today. 8/27; patient was seen for the first time last week he has a wound over the metatarsal head on the right foot. 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 Allena Katz of podiatry. 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 He has been using Hydrofera Blue and sit to vet to the wound area. He had an x-ray done that showed faint sclerosis of the head of the second metatarsal which was nonspecific but new from 07/16/2018 which could be posttraumatic secondary to avascular necrosis or chronic infection 04-29-2023 upon evaluation today patient appears to be doing well currently in regard to his wound. Unfortunately he did not tolerate the total contact cast very well at all at this point. He tells me that he has been having some issues here specifically with the cast causing some irritation to his leg due to the fact that he SOHAM, GODETTE ([id number]) [id number] Page 3 of 8 is had a significant break in the leg. The BX. Fortunately I do not see any signs of active infection locally or systemically at this time. Electronic Signature(s) Signed: 04/29/2023 1:40:15 PM By: Allen Derry PA-C Entered By: Allen Derry on 04/29/2023 10:40:15 -------------------------------------------------------------------------------- Physical Exam Details Patient Name: Date of Service: Harold Mould M. 04/29/2023 12:30 PM Medical Record Number: [id number] Patient Account Number: [id number] Date of Birth/Sex: Treating RN: [birth year] (62 y.o. [name] Primary Care Provider: [name] Other Clinician: Referring Provider: Treating Provider/Extender: [name] in Treatment: 8 Constitutional Well-nourished and well-hydrated in no acute distress. Respiratory normal breathing without  difficulty. Psychiatric this patient is able to make decisions and demonstrates good insight into disease process. Alert and Oriented x 3. pleasant and cooperative. Notes Upon inspection patient's wound bed did require sharp debridement clearway necrotic debris he tolerated that today without complication and  [name], [name] ([id number]) [id number] Page 1 of 8 Visit Report for 04/29/2023 Chief Complaint Document Details Patient Name: Date of Service: [name] [facility] 04/29/2023 12:30 PM Medical Record Number: [id number] Patient Account Number: [id number] Date of Birth/Sex: Treating RN: May 29, 1961 (62 y.o. Harold Petit) Yevonne Pax Primary Care Provider: [name] Other Clinician: Referring Provider: Treating Provider/Extender: [name] in Treatment: 8 Information Obtained from: Patient Chief Complaint Right plantar foot ulcer Electronic Signature(s) Signed: 04/29/2023 12:53:59 PM By: Allen Derry PA-C Entered By: Allen Derry on 04/29/2023 09:53:59 -------------------------------------------------------------------------------- Debridement Details Patient Name: Date of Service: [name] HN M. 04/29/2023 12:30 PM Medical Record Number: [id number] Patient Account Number: [id number] Date of Birth/Sex: Treating RN: 1961-03-30 (62 y.o. [name] Primary Care Provider: [name] Other Clinician: Referring Provider: Treating Provider/Extender: [name] in Treatment: 8 Debridement Performed for Assessment: Wound #2 Right,Plantar Foot Performed By: Physician Allen Derry, PA-C Debridement Type: Debridement Severity of Tissue Pre Debridement: Fat layer exposed Level of Consciousness (Pre-procedure): Awake and Alert Pre-procedure Verification/Time Out Yes - 12:55 Taken: Start Time: 12:55 Percent of Wound Bed Debrided: 100% T Area Debrided (cm): otal 0.13 Tissue and other material debrided: Viable, Non-Viable, Callus, Subcutaneous, Skin: Dermis , Skin: Epidermis Level: Skin/Subcutaneous Tissue Debridement Description: Excisional Instrument: Curette Bleeding: Moderate Hemostasis Achieved: Silver Nitrate End Time: 12:59 Procedural Pain: 0 Post Procedural Pain:  0 [name], [name] ([id number]) [id number] Page 2 of 8 Response to Treatment: Procedure was tolerated well Level of Consciousness (Post- Awake and Alert procedure): Post Debridement Measurements of Total Wound Length: (cm) 0.4 Width: (cm) 0.4 Depth: (cm) 0.2 Volume: (cm) 0.025 Character of Wound/Ulcer Post Debridement: Improved Severity of Tissue Post Debridement: Fat layer exposed Post Procedure Diagnosis Same as Pre-procedure Electronic Signature(s) Signed: 04/29/2023 6:10:25 PM By: Allen Derry PA-C Signed: 05/02/2023 11:41:18 AM By: Yevonne Pax RN Entered By: Yevonne Pax on 04/29/2023 10:02:40 -------------------------------------------------------------------------------- HPI Details Patient Name: Date of Service: [name] HN M. 04/29/2023 12:30 PM Medical Record Number: [id number] Patient Account Number: [id number] Date of Birth/Sex: Treating RN: 10-10-1960 (62 y.o. [name] Primary Care Provider: [name] Other Clinician: Referring Provider: Treating Provider/Extender: [name] in Treatment: 8 History of Present Illness Chronic/Inactive Conditions Condition 1: 03-04-2023 patient's arterial screening today actually appears to show some signs of being somewhat low at 0.83 on the left 1.53 on the right which is essentially noncompressible. I really do not think we can assure ourselves of these readings and I think he really should see vascular for a thorough arterial evaluation in order to ensure that he has good blood flow although I suspect he probably does. HPI Description: 07/20/17 on evaluation today patient actually appears to be doing somewhat poorly in regard to his right plantar great toe ulcer. This is something that he states started as what he thought to be a blister May 04, 2018. Subsequently he treated this on his own for a while thinking that it would just get better. As it  did not get better he eventually did go to have this evaluated where it was treated appropriately with it sounds like Bactroban ointment however the patient states that it never completely resolved. Subsequently if this continue to get worse and was looking more irritated and inflamed he did go to the hospital more recently where he was prescribed Keflex. Subsequently he was also referred to the wound care center here for further evaluation by [place]. He had an x-ray  [name], [name] ([id number]) [id number] Page 1 of 8 Visit Report for 04/29/2023 Chief Complaint Document Details Patient Name: Date of Service: [name] [facility]. 04/29/2023 12:30 PM Medical Record Number: [id number] Patient Account Number: [id number] Date of Birth/Sex: Treating RN: May 29, 1961 (62 y.o. Harold Petit) Yevonne Pax Primary Care Provider: [name] Other Clinician: Referring Provider: Treating Provider/Extender: [name] in Treatment: 8 Information Obtained from: Patient Chief Complaint Right plantar foot ulcer Electronic Signature(s) Signed: 04/29/2023 12:53:59 PM By: Allen Derry PA-C Entered By: Allen Derry on 04/29/2023 09:53:59 -------------------------------------------------------------------------------- Debridement Details Patient Name: Date of Service: [name] HN M. 04/29/2023 12:30 PM Medical Record Number: [id number] Patient Account Number: [id number] Date of Birth/Sex: Treating RN: 1961-03-30 (62 y.o. [name] Primary Care Provider: [name] Other Clinician: Referring Provider: Treating Provider/Extender: [name] in Treatment: 8 Debridement Performed for Assessment: Wound #2 Right,Plantar Foot Performed By: Physician Allen Derry, PA-C Debridement Type: Debridement Severity of Tissue Pre Debridement: Fat layer exposed Level of Consciousness (Pre-procedure): Awake and Alert Pre-procedure Verification/Time Out Yes - 12:55 Taken: Start Time: 12:55 Percent of Wound Bed Debrided: 100% T Area Debrided (cm): otal 0.13 Tissue and other material debrided: Viable, Non-Viable, Callus, Subcutaneous, Skin: Dermis , Skin: Epidermis Level: Skin/Subcutaneous Tissue Debridement Description: Excisional Instrument: Curette Bleeding: Moderate Hemostasis Achieved: Silver Nitrate End Time: 12:59 Procedural Pain: 0 Post Procedural Pain:  0 [name], [name] ([id number]) [id number] Page 2 of 8 Response to Treatment: Procedure was tolerated well Level of Consciousness (Post- Awake and Alert procedure): Post Debridement Measurements of Total Wound Length: (cm) 0.4 Width: (cm) 0.4 Depth: (cm) 0.2 Volume: (cm) 0.025 Character of Wound/Ulcer Post Debridement: Improved Severity of Tissue Post Debridement: Fat layer exposed Post Procedure Diagnosis Same as Pre-procedure Electronic Signature(s) Signed: 04/29/2023 6:10:25 PM By: Allen Derry PA-C Signed: 05/02/2023 11:41:18 AM By: Yevonne Pax RN Entered By: Yevonne Pax on 04/29/2023 10:02:40 -------------------------------------------------------------------------------- HPI Details Patient Name: Date of Service: [name] HN M. 04/29/2023 12:30 PM Medical Record Number: [id number] Patient Account Number: [id number] Date of Birth/Sex: Treating RN: 10-10-1960 (62 y.o. [name] Primary Care Provider: [name] Other Clinician: Referring Provider: Treating Provider/Extender: [name] in Treatment: 8 History of Present Illness Chronic/Inactive Conditions Condition 1: 03-04-2023 patient's arterial screening today actually appears to show some signs of being somewhat low at 0.83 on the left 1.53 on the right which is essentially noncompressible. I really do not think we can assure ourselves of these readings and I think he really should see vascular for a thorough arterial evaluation in order to ensure that he has good blood flow although I suspect he probably does. HPI Description: 07/20/17 on evaluation today patient actually appears to be doing somewhat poorly in regard to his right plantar great toe ulcer. This is something that he states started as what he thought to be a blister May 04, 2018. Subsequently he treated this on his own for a while thinking that it would just get better. As it  did not get better he eventually did go to have this evaluated where it was treated appropriately with it sounds like Bactroban ointment however the patient states that it never completely resolved. Subsequently if this continue to get worse and was looking more irritated and inflamed he did go to the hospital more recently where he was prescribed Keflex. Subsequently he was also referred to the wound care center here for further evaluation by [place]. He had an x-ray

## 2023-05-02 NOTE — Progress Notes (Signed)
EARLIS, BIZZELL ([id number]) [id number] Page 1 of 8 Visit Report for 04/29/2023 Arrival Information Details Patient Name: Date of Service: [name] [facility] M. 04/29/2023 12:30 PM Medical Record Number: [id number] Patient Account Number: [id number] Date of Birth/Sex: Treating RN: Feb 07, 1961 (62 y.o. [name]) Yevonne Pax Primary Care Venisha Boehning: [name] Other Clinician: Referring Javarian Jakubiak: Treating Madylyn Insco/Extender: [name] in Treatment: 8 Visit Information History Since Last Visit Added or deleted any medications: No Patient Arrived: Ambulatory Any new allergies or adverse reactions: No Arrival Time: 12:43 Had a fall or experienced change in No Accompanied By: self activities of daily living that may affect Transfer Assistance: None risk of falls: Patient Identification Verified: Yes Signs or symptoms of abuse/neglect since last visito No Secondary Verification Process Completed: Yes Hospitalized since last visit: No Patient Requires Transmission-Based Precautions: No Implantable device outside of the clinic excluding No Patient Has Alerts: Yes cellular tissue based products placed in the center Patient Alerts: Diabetic Type 2 since last visit: Has Dressing in Place as Prescribed: Yes Has Compression in Place as Prescribed: No Has Footwear/Offloading in Place as Prescribed: No Right: T Contact Cast otal Pain Present Now: No Notes patient removed cast Electronic Signature(s) Signed: 05/02/2023 11:41:18 AM By: Yevonne Pax RN Entered By: Yevonne Pax on 04/29/2023 09:44:49 -------------------------------------------------------------------------------- Clinic Level of Care Assessment Details Patient Name: Date of Service: [name] M. 04/29/2023 12:30 PM Medical Record Number: [id number] Patient Account Number: [id number] Date of Birth/Sex: Treating RN: 04-Jan-1961 (62 y.o. [name] Primary Care Oracio Galen: [name] Other Clinician: Referring Marcia Lepera: Treating Anderson Coppock/Extender: [name] in Treatment: 8 Clinic Level of Care Assessment Items TOOL 1 Quantity Score CLANTON, CURETON ([id number]) [id number] Page 2 of 8 []  - 0 Use when EandM and Procedure is performed on INITIAL visit ASSESSMENTS - Nursing Assessment / Reassessment []  - 0 General Physical Exam (combine w/ comprehensive assessment (listed just below) when performed on new pt. evals) []  - 0 Comprehensive Assessment (HX, ROS, Risk Assessments, Wounds Hx, etc.) ASSESSMENTS - Wound and Skin Assessment / Reassessment []  - 0 Dermatologic / Skin Assessment (not related to wound area) ASSESSMENTS - Ostomy and/or Continence Assessment and Care []  - 0 Incontinence Assessment and Management []  - 0 Ostomy Care Assessment and Management (repouching, etc.) PROCESS - Coordination of Care []  - 0 Simple Patient / Family Education for ongoing care []  - 0 Complex (extensive) Patient / Family Education for ongoing care []  - 0 Staff obtains Chiropractor, Records, T Results / Process Orders est []  - 0 Staff telephones HHA, Nursing Homes / Clarify orders / etc []  - 0 Routine Transfer to another Facility (non-emergent condition) []  - 0 Routine Hospital Admission (non-emergent condition) []  - 0 New Admissions / Manufacturing engineer / Ordering NPWT Apligraf, etc. , []  - 0 Emergency Hospital Admission (emergent condition) PROCESS - Special Needs []  - 0 Pediatric / Minor Patient Management []  - 0 Isolation Patient Management []  - 0 Hearing / Language / Visual special needs []  - 0 Assessment of Community assistance (transportation, D/C planning, etc.) []  - 0 Additional assistance / Altered mentation []  - 0 Support Surface(s) Assessment (bed, cushion, seat, etc.) INTERVENTIONS - Miscellaneous []  - 0 External ear exam []  -  0 Patient Transfer (multiple staff / Nurse, adult / Similar devices) []  - 0 Simple Staple / Suture removal (25 or less) []  - 0 Complex Staple / Suture removal (26 or more) []  - 0 Hypo/Hyperglycemic Management (do not check  Active Ulcer/skin breakdown will have a volume reduction of 30% by week 4 Date Initiated: 03/04/2023 Date Inactivated: 04/10/2023 Target Resolution Date: 04/04/2023 Goal Status: Unmet Unmet Reason: comorbidties Ulcer/skin breakdown will have a volume reduction of 50% by week 8 Date Initiated: 03/04/2023 Target Resolution Date: 05/04/2023 Goal Status: Active Ulcer/skin breakdown will have a volume reduction of 80% by week 12 Date Initiated: 03/04/2023 Target Resolution Date: 06/04/2023 Goal Status: Active Ulcer/skin breakdown will heal within 14 weeks Date Initiated: 03/04/2023 Target Resolution Date: 06/18/2023 Goal Status: Active Interventions: Assess patient/caregiver ability to obtain necessary supplies Assess patient/caregiver ability to perform ulcer/skin care regimen upon admission and as needed Assess ulceration(s) every visit Provide education on ulcer and skin  care Treatment Activities: Referred to DME Channelle Bottger for dressing supplies : 03/04/2023 Skin care regimen initiated : 03/04/2023 Notes: Electronic Signature(s) Signed: 05/02/2023 11:41:18 AM By: Yevonne Pax RN Entered By: Yevonne Pax on 04/29/2023 09:50:02 -------------------------------------------------------------------------------- Pain Assessment Details Patient Name: Date of Service: [name] M. 04/29/2023 12:30 PM Medical Record Number: [id number] Patient Account Number: [id number] Date of Birth/Sex: Treating RN: [birth year] (62 y.o. [name] Primary Care Jerime Arif: [name] Other Clinician: Referring Shameria Trimarco: Treating Freddi Schrager/Extender: [name] in Treatment: 8 Active Problems Location of Pain Severity and Description of Pain Patient Has Paino No Site Locations BRENNER, SANTOLI Harold ([id number]) [id number] Page 6 of 8 Pain Management and Medication Current Pain Management: Electronic Signature(s) Signed: 05/02/2023 11:41:18 AM By: Yevonne Pax RN Entered By: Yevonne Pax on 04/29/2023 09:45:52 -------------------------------------------------------------------------------- Patient/Caregiver Education Details Patient Name: Date of Service: [name]. 10/15/2024andnbsp12:30 PM Medical Record Number: [id number] Patient Account Number: [id number] Date of Birth/Gender: Treating RN: Sep 08, 1960 (62 y.o. [name] Primary Care Physician: [name] Other Clinician: Referring Physician: Treating Physician/Extender: [name] in Treatment: 8 Education Assessment Education Provided To: Patient Education Topics Provided Wound/Skin Impairment: Handouts: Caring for Your Ulcer Methods: Explain/Verbal Responses: State content correctly Electronic Signature(s) Signed: 05/02/2023 11:41:18 AM By: Yevonne Pax RN Entered By: Yevonne Pax on  04/29/2023 09:50:13 -------------------------------------------------------------------------------- Wound Assessment Details Patient Name: Date of Service: [name] M. 04/29/2023 12:30 PM Medical Record Number: [id number] Patient Account Number: [id number] Date of Birth/Sex: Treating RN: 03-11-1961 (62 y.o. [name] Primary Care Isiaih Hollenbach: [name] Other Clinician: Referring Yony Roulston: Treating Shacara Cozine/Extender: [name] in Treatment: 8 Wound Status Wound Number: 2 Primary Etiology: Diabetic Wound/Ulcer of the Lower Extremity CAISON, VERNIER ([id number]) [id number] Page 7 of 8 Wound Location: Right, Plantar Foot Wound Status: Open Wounding Event: Pressure Injury Comorbid History: Type II Diabetes Date Acquired: 01/02/2023 Weeks Of Treatment: 8 Clustered Wound: No Wound Measurements Length: (cm) 0.4 Width: (cm) 0.4 Depth: (cm) 0.2 Area: (cm) 0.126 Volume: (cm) 0.025 % Reduction in Area: -306.5% % Reduction in Volume: -316.7% Epithelialization: Large (67-100%) Tunneling: No Undermining: No Wound Description Classification: Grade 1 Exudate Amount: Medium Exudate Type: Serosanguineous Exudate Color: red, brown Foul Odor After Cleansing: No Slough/Fibrino No Wound Bed Granulation Amount: Large (67-100%) Exposed Structure Granulation Quality: Pink Fascia Exposed: No Necrotic Amount: None Present (0%) Fat Layer (Subcutaneous Tissue) Exposed: Yes Tendon Exposed: No Muscle Exposed: No Joint Exposed: No Bone Exposed: No Treatment Notes Wound #2 (Foot) Wound Laterality: Plantar, Right Cleanser Soap and Water Discharge Instruction: Gently cleanse wound with antibacterial soap, rinse and pat dry prior to dressing wounds Peri-Wound Care Topical Primary Dressing Hydrofera Blue Ready Transfer Foam, 2.5x2.5 (in/in) Discharge Instruction: Apply Hydrofera Blue Ready  to wound bed as  directed Secondary Dressing Coverlet Latex-Free Fabric Adhesive Dressings Discharge Instruction: 1.5 x 2 Secured With Compression Wrap Compression Stockings Add-Ons Electronic Signature(s) Signed: 05/02/2023 11:41:18 AM By: Yevonne Pax RN Entered By: Yevonne Pax on 04/29/2023 09:49:24 Vitals Details -------------------------------------------------------------------------------- [name] ([id number]) [id number] Page 8 of 8 Patient Name: Date of Service: [name]. 04/29/2023 12:30 PM Medical Record Number: [id number] Patient Account Number: [id number] Date of Birth/Sex: Treating RN: 03/06/1961 (62 y.o. [name]) Yevonne Pax Primary Care Lyza Houseworth: [name] Other Clinician: Referring Roizy Harold: Treating Pauleen Goleman/Extender: [name] in Treatment: 8 Vital Signs Time Taken: 12:45 Temperature (F): 97.5 Height (in): 68 Pulse (bpm): 64 Weight (lbs): 209 Respiratory Rate (breaths/min): 18 Body Mass Index (BMI): 31.8 Blood Pressure (mmHg): 154/68 Reference Range: 80 - 120 mg / dl Electronic Signature(s) Signed: 05/02/2023 11:41:18 AM By: Yevonne Pax RN Entered By: Yevonne Pax on 04/29/2023 09:45:41  to wound bed as  directed Secondary Dressing Coverlet Latex-Free Fabric Adhesive Dressings Discharge Instruction: 1.5 x 2 Secured With Compression Wrap Compression Stockings Add-Ons Electronic Signature(s) Signed: 05/02/2023 11:41:18 AM By: Yevonne Pax RN Entered By: Yevonne Pax on 04/29/2023 09:49:24 Vitals Details -------------------------------------------------------------------------------- [name] ([id number]) [id number] Page 8 of 8 Patient Name: Date of Service: [name]. 04/29/2023 12:30 PM Medical Record Number: [id number] Patient Account Number: [id number] Date of Birth/Sex: Treating RN: 03/06/1961 (62 y.o. [name]) Yevonne Pax Primary Care Lyza Houseworth: [name] Other Clinician: Referring Roizy Harold: Treating Pauleen Goleman/Extender: [name] in Treatment: 8 Vital Signs Time Taken: 12:45 Temperature (F): 97.5 Height (in): 68 Pulse (bpm): 64 Weight (lbs): 209 Respiratory Rate (breaths/min): 18 Body Mass Index (BMI): 31.8 Blood Pressure (mmHg): 154/68 Reference Range: 80 - 120 mg / dl Electronic Signature(s) Signed: 05/02/2023 11:41:18 AM By: Yevonne Pax RN Entered By: Yevonne Pax on 04/29/2023 09:45:41

## 2023-05-06 ENCOUNTER — Encounter: Payer: Medicaid Other | Admitting: Physician Assistant

## 2023-05-06 DIAGNOSIS — E1151 Type 2 diabetes mellitus with diabetic peripheral angiopathy without gangrene: Secondary | ICD-10-CM | POA: Diagnosis not present

## 2023-05-06 DIAGNOSIS — E11621 Type 2 diabetes mellitus with foot ulcer: Secondary | ICD-10-CM | POA: Diagnosis not present

## 2023-05-06 DIAGNOSIS — N184 Chronic kidney disease, stage 4 (severe): Secondary | ICD-10-CM | POA: Diagnosis not present

## 2023-05-06 DIAGNOSIS — E11622 Type 2 diabetes mellitus with other skin ulcer: Secondary | ICD-10-CM | POA: Diagnosis not present

## 2023-05-06 DIAGNOSIS — E1122 Type 2 diabetes mellitus with diabetic chronic kidney disease: Secondary | ICD-10-CM | POA: Diagnosis not present

## 2023-05-06 DIAGNOSIS — I129 Hypertensive chronic kidney disease with stage 1 through stage 4 chronic kidney disease, or unspecified chronic kidney disease: Secondary | ICD-10-CM | POA: Diagnosis not present

## 2023-05-06 DIAGNOSIS — L97512 Non-pressure chronic ulcer of other part of right foot with fat layer exposed: Secondary | ICD-10-CM | POA: Diagnosis not present

## 2023-05-07 ENCOUNTER — Ambulatory Visit: Payer: Medicaid Other | Admitting: Family Medicine

## 2023-05-08 NOTE — Progress Notes (Signed)
[name] [name] HN M. 05/06/2023 12:00 PM Medical Record Number: [id number] Patient Account Number: [id number] Date of Birth/Sex: Treating RN: 17-May-1961 (62 y.o. [name] [name] Primary Care Wyn Nettle: [name] Other Clinician: Referring Lihanna Biever: Treating Ashlee Player/Extender: [name] in Treatment: 9 Active Inactive Wound/Skin Impairment Nursing Diagnoses: Impaired tissue integrity Knowledge deficit related to ulceration/compromised skin integrity Goals: Patient/caregiver will verbalize understanding of skin care regimen Date Initiated: 03/04/2023 Target Resolution Date: 05/10/2023 Goal Status: Active Ulcer/skin breakdown will have a volume reduction of 30% by week 4 Date Initiated: 03/04/2023 Date Inactivated: 04/10/2023 Target Resolution Date: 04/04/2023 Goal Status: Unmet Unmet Reason:  comorbidties Ulcer/skin breakdown will have a volume reduction of 50% by week 8 Date Initiated: 03/04/2023 Date Inactivated: 05/06/2023 Target Resolution Date: 05/04/2023 Goal Status: Unmet Unmet Reason: comorbidities Ulcer/skin breakdown will have a volume reduction of 80% by week 12 Date Initiated: 03/04/2023 Target Resolution Date: 06/04/2023 Goal Status: Active Ulcer/skin breakdown will heal within 14 weeks Date Initiated: 03/04/2023 Target Resolution Date: 06/18/2023 Goal Status: Active Interventions: Assess patient/caregiver ability to obtain necessary supplies Assess patient/caregiver ability to perform ulcer/skin care regimen upon admission and as needed Assess ulceration(s) every visit Provide education on ulcer and skin care Treatment Activities: Referred to DME Avonne Berkery for dressing supplies : 03/04/2023 Skin care regimen initiated : 03/04/2023 Notes: Electronic Signature(s) Signed: 05/08/2023 4:44:43 PM By: Angelina Pih Entered By: Angelina Pih on 05/06/2023 09:57:39 [name] ([id number]) [phone] [phone].pdf Page 6 of 9 -------------------------------------------------------------------------------- Pain Assessment Details Patient Name: Date of Service: Harold Mould M. 05/06/2023 12:00 PM Medical Record Number: [id number] Patient Account Number: [id number] Date of Birth/Sex: Treating RN: Mar 14, 1961 (62 y.o. [name] [name] Primary Care Maddex Garlitz: [name] Other Clinician: Referring Shanvi Moyd: Treating Mehr Depaoli/Extender: [name] in Treatment: 9 Active Problems Location of Pain Severity and Description of Pain Patient Has Paino No Site Locations Rate the pain. Current Pain Level: 0 Pain Management and Medication Current Pain Management: Electronic Signature(s) Signed: 05/08/2023 4:44:43 PM By: Angelina Pih Entered By: Angelina Pih on 05/06/2023  09:18:42 -------------------------------------------------------------------------------- Patient/Caregiver Education Details Patient Name: Date of Service: [name] Mote. 10/22/2024andnbsp12:00 PM Medical Record Number: [id number] Patient Account Number: [id number] Date of Birth/Gender: Treating RN: 07/25/1960 (62 y.o. [name] [name] Primary Care Physician: [name] Other Clinician: Referring Physician: Treating Physician/Extender: [name] in Treatment: [address], [place] ([id number]) [id number] Page 7 of 9 Education Assessment Education Provided To: Patient Education Topics Provided Wound/Skin Impairment: Handouts: Caring for Your Ulcer Methods: Explain/Verbal Responses: State content correctly Electronic Signature(s) Signed: 05/08/2023 4:44:43 PM By: Angelina Pih Entered By: Angelina Pih on 05/06/2023 09:57:49 -------------------------------------------------------------------------------- Wound Assessment Details Patient Name: Date of Service: [name] Lovely HN M. 05/06/2023 12:00 PM Medical Record Number: [id number] Patient Account Number: [id number] Date of Birth/Sex: Treating RN: January 20, 1961 (62 y.o. [name] [name] Primary Care Shandelle Borrelli: [name] Other Clinician: Referring Shourya Macpherson: Treating Toryn Dewalt/Extender: [name] in Treatment: 9 Wound Status Wound Number: 2 Primary Etiology: Diabetic Wound/Ulcer of the Lower Extremity Wound Location: Right, Plantar Foot Wound Status: Open Wounding Event: Pressure Injury Comorbid History: Type II Diabetes Date Acquired: 01/02/2023 Weeks Of Treatment: 9 Clustered Wound: No Photos Wound Measurements Length: (cm) 0.2 Width: (cm) 0.2 Depth: (cm) 0.2 Area: (cm) 0. Volume: (cm) 0. [name] [name] [name] [name] ([id number]) Wound Description Classification: Grade 1 Exudate Amount: Medium Exudate  Type: Serosanguineous Exudate Color: red, brown Foul Odor After Cleansing: Slough/Fibrino % Reduction in Area: 0% % Reduction in  Volume: 0% Epithelialization: Large (67-100%) 031 Tunneling: No 006 Undermining: Yes Starting Position (o'clock): 12 Ending Position (o'clock): 12 Maximum Distance: (cm) 0.1 [id number] Page 8 of 9 No No Wound Bed Granulation Amount: Large (67-100%) Exposed Structure Granulation Quality: Pink Fascia Exposed: No Necrotic Amount: None Present (0%) Fat Layer (Subcutaneous Tissue) Exposed: Yes Tendon Exposed: No Muscle Exposed: No Joint Exposed: No Bone Exposed: No Assessment Notes some callus build up noted Treatment Notes Wound #2 (Foot) Wound Laterality: Plantar, Right Cleanser Soap and Water Discharge Instruction: Gently cleanse wound with antibacterial soap, rinse and pat dry prior to dressing wounds Peri-Wound Care Topical Primary Dressing Hydrofera Blue Ready Transfer Foam, 2.5x2.5 (in/in) Discharge Instruction: Apply Hydrofera Blue Ready to wound bed as directed Secondary Dressing Coverlet Latex-Free Fabric Adhesive Dressings Discharge Instruction: 1.5 x 2 Secured With Compression Wrap Compression Stockings Add-Ons Electronic Signature(s) Signed: 05/08/2023 4:44:43 PM By: Angelina Pih Entered By: Angelina Pih on 05/06/2023 09:15:10 -------------------------------------------------------------------------------- Vitals Details Patient Name: Date of Service: [name] [name] [name] HN M. 05/06/2023 12:00 PM Medical Record Number: [id number] Patient Account Number: [id number] Date of Birth/Sex: Treating RN: 1961-06-24 (62 y.o. [name] [name] Primary Care Kenadie Royce: [name] Other Clinician: Referring Jourdyn Ferrin: Treating Adin Laker/Extender: [name] in Treatment: 9 Vital Signs Time Taken: 12:05 Temperature (F): 97.6 [name] [name] [name] [name] [place] ([id number])  [id number] Page 9 of 9 Height (in): 68 Pulse (bpm): 60 Weight (lbs): 209 Respiratory Rate (breaths/min): 18 Body Mass Index (BMI): 31.8 Blood Pressure (mmHg): 126/68 Reference Range: 80 - 120 mg / dl Electronic Signature(s) Signed: 05/06/2023 12:18:29 PM By: Angelina Pih Entered By: Angelina Pih on 05/06/2023 09:18:29  Volume: 0% Epithelialization: Large (67-100%) 031 Tunneling: No 006 Undermining: Yes Starting Position (o'clock): 12 Ending Position (o'clock): 12 Maximum Distance: (cm) 0.1 [id number] Page 8 of 9 No No Wound Bed Granulation Amount: Large (67-100%) Exposed Structure Granulation Quality: Pink Fascia Exposed: No Necrotic Amount: None Present (0%) Fat Layer (Subcutaneous Tissue) Exposed: Yes Tendon Exposed: No Muscle Exposed: No Joint Exposed: No Bone Exposed: No Assessment Notes some callus build up noted Treatment Notes Wound #2 (Foot) Wound Laterality: Plantar, Right Cleanser Soap and Water Discharge Instruction: Gently cleanse wound with antibacterial soap, rinse and pat dry prior to dressing wounds Peri-Wound Care Topical Primary Dressing Hydrofera Blue Ready Transfer Foam, 2.5x2.5 (in/in) Discharge Instruction: Apply Hydrofera Blue Ready to wound bed as directed Secondary Dressing Coverlet Latex-Free Fabric Adhesive Dressings Discharge Instruction: 1.5 x 2 Secured With Compression Wrap Compression Stockings Add-Ons Electronic Signature(s) Signed: 05/08/2023 4:44:43 PM By: Angelina Pih Entered By: Angelina Pih on 05/06/2023 09:15:10 -------------------------------------------------------------------------------- Vitals Details Patient Name: Date of Service: [name] [name] [name] HN M. 05/06/2023 12:00 PM Medical Record Number: [id number] Patient Account Number: [id number] Date of Birth/Sex: Treating RN: 1961-06-24 (62 y.o. [name] [name] Primary Care Kenadie Royce: [name] Other Clinician: Referring Jourdyn Ferrin: Treating Adin Laker/Extender: [name] in Treatment: 9 Vital Signs Time Taken: 12:05 Temperature (F): 97.6 [name] [name] [name] [name] [place] ([id number])  [id number] Page 9 of 9 Height (in): 68 Pulse (bpm): 60 Weight (lbs): 209 Respiratory Rate (breaths/min): 18 Body Mass Index (BMI): 31.8 Blood Pressure (mmHg): 126/68 Reference Range: 80 - 120 mg / dl Electronic Signature(s) Signed: 05/06/2023 12:18:29 PM By: Angelina Pih Entered By: Angelina Pih on 05/06/2023 09:18:29  Volume: 0% Epithelialization: Large (67-100%) 031 Tunneling: No 006 Undermining: Yes Starting Position (o'clock): 12 Ending Position (o'clock): 12 Maximum Distance: (cm) 0.1 [id number] Page 8 of 9 No No Wound Bed Granulation Amount: Large (67-100%) Exposed Structure Granulation Quality: Pink Fascia Exposed: No Necrotic Amount: None Present (0%) Fat Layer (Subcutaneous Tissue) Exposed: Yes Tendon Exposed: No Muscle Exposed: No Joint Exposed: No Bone Exposed: No Assessment Notes some callus build up noted Treatment Notes Wound #2 (Foot) Wound Laterality: Plantar, Right Cleanser Soap and Water Discharge Instruction: Gently cleanse wound with antibacterial soap, rinse and pat dry prior to dressing wounds Peri-Wound Care Topical Primary Dressing Hydrofera Blue Ready Transfer Foam, 2.5x2.5 (in/in) Discharge Instruction: Apply Hydrofera Blue Ready to wound bed as directed Secondary Dressing Coverlet Latex-Free Fabric Adhesive Dressings Discharge Instruction: 1.5 x 2 Secured With Compression Wrap Compression Stockings Add-Ons Electronic Signature(s) Signed: 05/08/2023 4:44:43 PM By: Angelina Pih Entered By: Angelina Pih on 05/06/2023 09:15:10 -------------------------------------------------------------------------------- Vitals Details Patient Name: Date of Service: [name] [name] [name] HN M. 05/06/2023 12:00 PM Medical Record Number: [id number] Patient Account Number: [id number] Date of Birth/Sex: Treating RN: 1961-06-24 (62 y.o. [name] [name] Primary Care Kenadie Royce: [name] Other Clinician: Referring Jourdyn Ferrin: Treating Adin Laker/Extender: [name] in Treatment: 9 Vital Signs Time Taken: 12:05 Temperature (F): 97.6 [name] [name] [name] [name] [place] ([id number])  [id number] Page 9 of 9 Height (in): 68 Pulse (bpm): 60 Weight (lbs): 209 Respiratory Rate (breaths/min): 18 Body Mass Index (BMI): 31.8 Blood Pressure (mmHg): 126/68 Reference Range: 80 - 120 mg / dl Electronic Signature(s) Signed: 05/06/2023 12:18:29 PM By: Angelina Pih Entered By: Angelina Pih on 05/06/2023 09:18:29

## 2023-05-08 NOTE — Progress Notes (Signed)
QUANTEZ, MAZANEC Okarche ([id number]) [id number] Page 1 of 7 Visit Report for 05/06/2023 Chief Complaint Document Details Patient Name: Date of Service: [name] [facility] M. 05/06/2023 12:00 PM Medical Record Number: [id number] Patient Account Number: [id number] Date of Birth/Sex: Treating RN: 18-Mar-1961 (62 y.o. Laymond Purser Primary Care Provider: [name] Other Clinician: Referring Provider: Treating Provider/Extender: [name] in Treatment: 9 Information Obtained from: Patient Chief Complaint Right plantar foot ulcer Electronic Signature(s) Signed: 05/06/2023 1:32:25 PM By: Allen Derry PA-C Entered By: Allen Derry on 05/06/2023 10:32:25 -------------------------------------------------------------------------------- Debridement Details Patient Name: Date of Service: [name] HN M. 05/06/2023 12:00 PM Medical Record Number: [id number] Patient Account Number: [id number] Date of Birth/Sex: Treating RN: 11-01-1960 (62 y.o. Laymond Purser Primary Care Provider: [name] Other Clinician: Referring Provider: Treating Provider/Extender: [name] in Treatment: 9 Debridement Performed for Assessment: Wound #2 Right,Plantar Foot Performed By: Physician Allen Derry, PA-C Debridement Type: Debridement Severity of Tissue Pre Debridement: Fat layer exposed Level of Consciousness (Pre-procedure): Awake and Alert Pre-procedure Verification/Time Out Yes - 12:49 Taken: Pain Control: Lidocaine 4% T opical Solution Percent of Wound Bed Debrided: 100% T Area Debrided (cm): otal 0.03 Tissue and other material debrided: Viable, Non-Viable, Callus, Slough, Subcutaneous, Slough Level: Skin/Subcutaneous Tissue Debridement Description: Excisional Instrument: Curette Bleeding: Moderate Hemostasis Achieved: Pressure Response to Treatment: Procedure was tolerated  well Level of Consciousness (Post- Awake and Alert procedure): Harold, [name] ([id number]) [id number] Page 2 of 7 Post Debridement Measurements of Total Wound Length: (cm) 0.2 Width: (cm) 0.2 Depth: (cm) 0.2 Volume: (cm) 0.006 Character of Wound/Ulcer Post Debridement: Stable Severity of Tissue Post Debridement: Fat layer exposed Post Procedure Diagnosis Same as Pre-procedure Electronic Signature(s) Signed: 05/06/2023 4:48:34 PM By: Allen Derry PA-C Signed: 05/08/2023 4:44:43 PM By: Angelina Pih Entered By: Angelina Pih on 05/06/2023 09:52:09 -------------------------------------------------------------------------------- HPI Details Patient Name: Date of Service: [name] ND, JO HN M. 05/06/2023 12:00 PM Medical Record Number: [id number] Patient Account Number: [id number] Date of Birth/Sex: Treating RN: October 07, 1960 (62 y.o. Laymond Purser Primary Care Provider: [name] Other Clinician: Referring Provider: Treating Provider/Extender: [name] in Treatment: 9 History of Present Illness Chronic/Inactive Conditions Condition 1: 03-04-2023 patient's arterial screening today actually appears to show some signs of being somewhat low at 0.83 on the left 1.53 on the right which is essentially noncompressible. I really do not think we can assure ourselves of these readings and I think he really should see vascular for a thorough arterial evaluation in order to ensure that he has good blood flow although I suspect he probably does. HPI Description: 07/20/17 on evaluation today patient actually appears to be doing somewhat poorly in regard to his right plantar great toe ulcer. This is something that he states started as what he thought to be a blister May 04, 2018. Subsequently he treated this on his own for a while thinking that it would just get better. As it did not get better he eventually did go to  have this evaluated where it was treated appropriately with it sounds like Bactroban ointment however the patient states that it never completely resolved. Subsequently if this continue to get worse and was looking more irritated and inflamed he did go to the hospital more recently where he was prescribed Keflex. Subsequently he was also referred to the wound care center here for further evaluation by [place]. He had an x-ray in the ER which revealed no evidence of osteomyelitis that I  shower; gently cleanse wound with antibacterial soap, rinse and pat dry prior to dressing wounds Anesthetic (Use 'Patient Medications' Section for Anesthetic Order Entry): Lidocaine applied to wound bed Edema Control - Lymphedema / Segmental Compressive Device / Other: Elevate, Exercise Daily and Avoid Standing for Long Periods of Time. Elevate legs to the level of the heart and pump ankles as often as possible Compression Pump: Use compression pump on left lower extremity for 60 minutes, twice daily. WOUND #2: - Foot Wound Laterality: Plantar, Right YUE, ORAMAS ([id number]) [id number] Page 7 of 7 Cleanser: Soap and Water 3 x Per Week/30 Days Discharge Instructions: Gently cleanse wound with antibacterial soap, rinse and pat dry prior to dressing wounds Prim Dressing: Hydrofera Blue Ready Transfer Foam, 2.5x2.5 (in/in) (Dispense As Written) 3 x Per Week/30 Days ary Discharge Instructions: Apply Hydrofera Blue Ready to wound bed as directed Secondary Dressing: Coverlet Latex-Free Fabric Adhesive Dressings 3 x Per Week/30 Days Discharge Instructions: 1.5 x 2 1. I would recommend based on what we are seeing that we go ahead and have the patient continue currently with the [facility] which I think is doing excellent. 2. Also can recommend that we continue with the coverlet over top of this. 3. Should also continue with a front offloading shoe which I think is gena be beneficial as well. We will see patient back for reevaluation in 1 week here in the clinic. If anything worsens or changes patient will contact our office for additional recommendations. Electronic Signature(s) Signed:  05/06/2023 1:33:57 PM By: Allen Derry PA-C Entered By: Allen Derry on 05/06/2023 10:33:57 -------------------------------------------------------------------------------- SuperBill Details Patient Name: Date of Service: [place] Breeding, Caprice Red M. 05/06/2023 Medical Record Number: [id number] Patient Account Number: [id number] Date of Birth/Sex: Treating RN: 1961-02-19 (62 y.o. 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 I73.89 Other specified peripheral vascular diseases N18.4 Chronic kidney disease, stage 4 (severe) I10 Essential (primary) hypertension Facility Procedures : CPT4 Code: 34742595 Description: 11042 - DEB SUBQ TISSUE 20 SQ CM/< ICD-10 Diagnosis Description L97.512 Non-pressure chronic ulcer of other part of right foot with fat layer exposed Modifier: Quantity: 1 Physician Procedures : CPT4 Code Description Modifier [id number] 11042 - WC PHYS SUBQ TISS 20 SQ CM ICD-10 Diagnosis Description L97.512 Non-pressure chronic ulcer of other part of right foot with fat layer exposed Quantity: 1 Electronic Signature(s) Signed: 05/06/2023 1:34:17 PM By: Allen Derry PA-C Entered By: Allen Derry on 05/06/2023 10:34:17  shower; gently cleanse wound with antibacterial soap, rinse and pat dry prior to dressing wounds Anesthetic (Use 'Patient Medications' Section for Anesthetic Order Entry): Lidocaine applied to wound bed Edema Control - Lymphedema / Segmental Compressive Device / Other: Elevate, Exercise Daily and Avoid Standing for Long Periods of Time. Elevate legs to the level of the heart and pump ankles as often as possible Compression Pump: Use compression pump on left lower extremity for 60 minutes, twice daily. WOUND #2: - Foot Wound Laterality: Plantar, Right YUE, ORAMAS ([id number]) [id number] Page 7 of 7 Cleanser: Soap and Water 3 x Per Week/30 Days Discharge Instructions: Gently cleanse wound with antibacterial soap, rinse and pat dry prior to dressing wounds Prim Dressing: Hydrofera Blue Ready Transfer Foam, 2.5x2.5 (in/in) (Dispense As Written) 3 x Per Week/30 Days ary Discharge Instructions: Apply Hydrofera Blue Ready to wound bed as directed Secondary Dressing: Coverlet Latex-Free Fabric Adhesive Dressings 3 x Per Week/30 Days Discharge Instructions: 1.5 x 2 1. I would recommend based on what we are seeing that we go ahead and have the patient continue currently with the [facility] which I think is doing excellent. 2. Also can recommend that we continue with the coverlet over top of this. 3. Should also continue with a front offloading shoe which I think is gena be beneficial as well. We will see patient back for reevaluation in 1 week here in the clinic. If anything worsens or changes patient will contact our office for additional recommendations. Electronic Signature(s) Signed:  05/06/2023 1:33:57 PM By: Allen Derry PA-C Entered By: Allen Derry on 05/06/2023 10:33:57 -------------------------------------------------------------------------------- SuperBill Details Patient Name: Date of Service: [place] Breeding, Caprice Red M. 05/06/2023 Medical Record Number: [id number] Patient Account Number: [id number] Date of Birth/Sex: Treating RN: 1961-02-19 (62 y.o. 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 I73.89 Other specified peripheral vascular diseases N18.4 Chronic kidney disease, stage 4 (severe) I10 Essential (primary) hypertension Facility Procedures : CPT4 Code: [id number] Description: 11042 - DEB SUBQ TISSUE 20 SQ CM/< ICD-10 Diagnosis Description L97.512 Non-pressure chronic ulcer of other part of right foot with fat layer exposed Modifier: Quantity: 1 Physician Procedures : CPT4 Code Description Modifier [id number] 11042 - WC PHYS SUBQ TISS 20 SQ CM ICD-10 Diagnosis Description L97.512 Non-pressure chronic ulcer of other part of right foot with fat layer exposed Quantity: 1 Electronic Signature(s) Signed: 05/06/2023 1:34:17 PM By: Allen Derry PA-C Entered By: Allen Derry on 05/06/2023 10:34:17  breathing without difficulty. Psychiatric this patient is able to make decisions and demonstrates good insight into disease process. Alert and Oriented x 3. pleasant and cooperative. Notes Upon inspection patient's wound bed actually showed signs of doing quite well I do not see any evidence of worsening overall and I believe that the patient is making good headway towards closure which is great news. He really does seem to be making progress here with a front offloading shoe. I think the cast could have help to make this faster but he did have some issues with the cast itself. Electronic Signature(s) Signed: 05/06/2023 1:33:27 PM By: Allen Derry PA-C Entered By: Allen Derry on 05/06/2023 10:33:26 -------------------------------------------------------------------------------- Physician Orders Details Patient Name: Date of Service: [name] HN M. 05/06/2023 12:00 PM Medical Record Number: [id number] Patient Account Number: [id number] Date of Birth/Sex: Treating RN: 29-Oct-1960 (62 y.o. Laymond Purser Primary Care Provider: [name] Other Clinician: Referring Provider: Treating Provider/Extender: [name] in Treatment: 9 The following information was scribed by: Angelina Pih The information was scribed for: Allen Derry Verbal / Phone Orders: No Diagnosis Coding ELICIO, GORGA ([id number]) [id number] Page 4 of 7 Follow-up Appointments Return Appointment in 1 week. Bathing/ Shower/ Hygiene May shower; gently cleanse wound with antibacterial soap, rinse and pat dry prior to dressing wounds Anesthetic (Use 'Patient Medications' Section for Anesthetic Order  Entry) Lidocaine applied to wound bed Edema Control - Lymphedema / Segmental Compressive Device / Other Elevate, Exercise Daily and A void Standing for Long Periods of Time. Elevate legs to the level of the heart and pump ankles as often as possible Compression Pump: Use compression pump on left lower extremity for 60 minutes, twice daily. Wound Treatment Wound #2 - Foot Wound Laterality: Plantar, Right Cleanser: Soap and Water 3 x Per Week/30 Days Discharge Instructions: Gently cleanse wound with antibacterial soap, rinse and pat dry prior to dressing wounds Prim Dressing: Hydrofera Blue Ready Transfer Foam, 2.5x2.5 (in/in) (Dispense As Written) 3 x Per Week/30 Days ary Discharge Instructions: Apply Hydrofera Blue Ready to wound bed as directed Secondary Dressing: Coverlet Latex-Free Fabric Adhesive Dressings 3 x Per Week/30 Days Discharge Instructions: 1.5 x 2 Electronic Signature(s) Signed: 05/06/2023 4:48:34 PM By: Allen Derry PA-C Signed: 05/08/2023 4:44:43 PM By: Angelina Pih Entered By: Angelina Pih on 05/06/2023 09:52:52 -------------------------------------------------------------------------------- Problem List Details Patient Name: Date of Service: [name] HN M. 05/06/2023 12:00 PM Medical Record Number: [id number] Patient Account Number: [id number] Date of Birth/Sex: Treating RN: [birth year] (62 y.o. Laymond Purser Primary Care Provider: [name] Other Clinician: Referring Provider: Treating Provider/Extender: [name] in Treatment: 9 Active Problems ICD-10 Encounter Code Description Active Date MDM Diagnosis E11.621 Type 2 diabetes mellitus with foot ulcer 03/04/2023 No Yes L97.512 Non-pressure chronic ulcer of other part of right foot with fat layer exposed 03/04/2023 No Yes I73.89 Other specified peripheral vascular diseases 03/04/2023 No Yes N18.4 Chronic kidney disease, stage 4 (severe) 03/04/2023 No  Yes [place] ([id number]) [id number] Page 5 of 7 I10 Essential (primary) hypertension 03/04/2023 No Yes Inactive Problems Resolved Problems Electronic Signature(s) Signed: 05/06/2023 1:32:22 PM By: Allen Derry PA-C Entered By: Allen Derry on 05/06/2023 10:32:22 -------------------------------------------------------------------------------- Progress Note Details Patient Name: Date of Service: STRICKLA ND, JO HN M. 05/06/2023 12:00 PM Medical Record Number: [id number] Patient Account Number: [id number] Date of Birth/Sex: Treating RN: October 15, 1960 (62 y.o. Laymond Purser Primary Care Provider: [name] Other Clinician: Referring Provider: Treating Provider/Extender: Gentry Roch  breathing without difficulty. Psychiatric this patient is able to make decisions and demonstrates good insight into disease process. Alert and Oriented x 3. pleasant and cooperative. Notes Upon inspection patient's wound bed actually showed signs of doing quite well I do not see any evidence of worsening overall and I believe that the patient is making good headway towards closure which is great news. He really does seem to be making progress here with a front offloading shoe. I think the cast could have help to make this faster but he did have some issues with the cast itself. Electronic Signature(s) Signed: 05/06/2023 1:33:27 PM By: Allen Derry PA-C Entered By: Allen Derry on 05/06/2023 10:33:26 -------------------------------------------------------------------------------- Physician Orders Details Patient Name: Date of Service: [name] HN M. 05/06/2023 12:00 PM Medical Record Number: [id number] Patient Account Number: [id number] Date of Birth/Sex: Treating RN: 29-Oct-1960 (62 y.o. Laymond Purser Primary Care Provider: [name] Other Clinician: Referring Provider: Treating Provider/Extender: [name] in Treatment: 9 The following information was scribed by: Angelina Pih The information was scribed for: Allen Derry Verbal / Phone Orders: No Diagnosis Coding ELICIO, GORGA ([id number]) [id number] Page 4 of 7 Follow-up Appointments Return Appointment in 1 week. Bathing/ Shower/ Hygiene May shower; gently cleanse wound with antibacterial soap, rinse and pat dry prior to dressing wounds Anesthetic (Use 'Patient Medications' Section for Anesthetic Order  Entry) Lidocaine applied to wound bed Edema Control - Lymphedema / Segmental Compressive Device / Other Elevate, Exercise Daily and A void Standing for Long Periods of Time. Elevate legs to the level of the heart and pump ankles as often as possible Compression Pump: Use compression pump on left lower extremity for 60 minutes, twice daily. Wound Treatment Wound #2 - Foot Wound Laterality: Plantar, Right Cleanser: Soap and Water 3 x Per Week/30 Days Discharge Instructions: Gently cleanse wound with antibacterial soap, rinse and pat dry prior to dressing wounds Prim Dressing: Hydrofera Blue Ready Transfer Foam, 2.5x2.5 (in/in) (Dispense As Written) 3 x Per Week/30 Days ary Discharge Instructions: Apply Hydrofera Blue Ready to wound bed as directed Secondary Dressing: Coverlet Latex-Free Fabric Adhesive Dressings 3 x Per Week/30 Days Discharge Instructions: 1.5 x 2 Electronic Signature(s) Signed: 05/06/2023 4:48:34 PM By: Allen Derry PA-C Signed: 05/08/2023 4:44:43 PM By: Angelina Pih Entered By: Angelina Pih on 05/06/2023 09:52:52 -------------------------------------------------------------------------------- Problem List Details Patient Name: Date of Service: [name] HN M. 05/06/2023 12:00 PM Medical Record Number: [id number] Patient Account Number: [id number] Date of Birth/Sex: Treating RN: [birth year] (62 y.o. Laymond Purser Primary Care Provider: [name] Other Clinician: Referring Provider: Treating Provider/Extender: [name] in Treatment: 9 Active Problems ICD-10 Encounter Code Description Active Date MDM Diagnosis E11.621 Type 2 diabetes mellitus with foot ulcer 03/04/2023 No Yes L97.512 Non-pressure chronic ulcer of other part of right foot with fat layer exposed 03/04/2023 No Yes I73.89 Other specified peripheral vascular diseases 03/04/2023 No Yes N18.4 Chronic kidney disease, stage 4 (severe) 03/04/2023 No  Yes [place] ([id number]) [id number] Page 5 of 7 I10 Essential (primary) hypertension 03/04/2023 No Yes Inactive Problems Resolved Problems Electronic Signature(s) Signed: 05/06/2023 1:32:22 PM By: Allen Derry PA-C Entered By: Allen Derry on 05/06/2023 10:32:22 -------------------------------------------------------------------------------- Progress Note Details Patient Name: Date of Service: STRICKLA ND, JO HN M. 05/06/2023 12:00 PM Medical Record Number: [id number] Patient Account Number: [id number] Date of Birth/Sex: Treating RN: October 15, 1960 (62 y.o. Laymond Purser Primary Care Provider: [name] Other Clinician: Referring Provider: Treating Provider/Extender: Gentry Roch  explained to the patient that osteomyelitis can be present even in a negative x- ray as it's not nearly as sensitive as an MRI would be. No fevers, chills, nausea, or vomiting noted at this time. The patient tells me that this might look a little bit better since he started the antibiotics. He does have a history of diabetes mellitus type II he also had a horse accident years ago and had to have significant surgery in regard to his right lower extremity that did leave some deformity in this regard. This is mainly centered in the shin region. Readmission: 03-04-2023 upon evaluation today patient appears to be doing somewhat poorly today in regard to the wound on the right plantar foot. 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 With that being said the patient tells me at this point that he has been treating this just at home using some Betadine on the area but really nothing else has been done up to this point. This is actually his first office for his care is concerned in a clinic setting. Patient's past medical history actually is very similar to previous when I saw him. He does have a history of diabetes mellitus type 2, peripheral vascular disease, chronic kidney disease stage IV, hypertension, he has not had any formal arterial testing up to this point. Patient is likely  going require an x-ray today. 8/27; patient was seen for the first time last week he has a wound over the metatarsal head on the right foot. 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 Allena Katz of podiatry. 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 He has been using Hydrofera Blue and sit to vet to the wound area. He had an x-ray done that showed faint sclerosis of the head of the second metatarsal which was nonspecific but new from 07/16/2018 which could be posttraumatic secondary to avascular necrosis or chronic infection 04-29-2023 upon evaluation today patient appears to be doing well currently in regard to his wound. Unfortunately he did not tolerate the total contact cast very well at all at this point. He tells me that he has been having some issues here specifically with the cast causing some irritation to his leg due to the fact that he is had a significant break in the leg. The BX. Fortunately I do not see any signs of active infection locally or systemically at this time. 05-06-2023 patient's wound on the distal portion of his foot actually is showing signs of improvement. In particular I think the right plantar foot again is overall smaller than what it was at the last visit when I saw him 1 week ago this is great news. MANOLO, SEILER [place] ([id number]) [id number] Page 3 of 7 Electronic Signature(s) Signed: 05/06/2023 1:33:17 PM By: Allen Derry PA-C Previous Signature: 05/06/2023 1:32:36 PM Version By: Allen Derry PA-C Entered By: Allen Derry on 05/06/2023 10:33:16 -------------------------------------------------------------------------------- Physical Exam Details Patient Name: Date of Service: [name] HN M. 05/06/2023 12:00 PM Medical Record Number: [id number] Patient Account Number: [id number] Date of Birth/Sex: Treating RN: March 15, 1961 (62 y.o. Laymond Purser Primary Care Provider:  [name] Other Clinician: Referring Provider: Treating Provider/Extender: [name] in Treatment: 9 Constitutional Well-nourished and well-hydrated in no acute distress. Respiratory normal

## 2023-05-12 DIAGNOSIS — I5022 Chronic systolic (congestive) heart failure: Secondary | ICD-10-CM | POA: Diagnosis not present

## 2023-05-12 DIAGNOSIS — N2581 Secondary hyperparathyroidism of renal origin: Secondary | ICD-10-CM | POA: Diagnosis not present

## 2023-05-12 DIAGNOSIS — D631 Anemia in chronic kidney disease: Secondary | ICD-10-CM | POA: Diagnosis not present

## 2023-05-12 DIAGNOSIS — E1122 Type 2 diabetes mellitus with diabetic chronic kidney disease: Secondary | ICD-10-CM | POA: Diagnosis not present

## 2023-05-12 DIAGNOSIS — N184 Chronic kidney disease, stage 4 (severe): Secondary | ICD-10-CM | POA: Diagnosis not present

## 2023-05-12 DIAGNOSIS — I10 Essential (primary) hypertension: Secondary | ICD-10-CM | POA: Diagnosis not present

## 2023-05-12 NOTE — Progress Notes (Signed)
Injury Date Acquired: 01/02/2023 Weeks Of Treatment: 6 Clustered Wound: No Wound Measurements Length: (cm) 0.2 Width: (cm) 0.2 Depth: (cm) 0.2 Area: (cm) 0.031 Volume: (cm) 0.006 % Reduction in Area: 0% % Reduction in Volume: 0% Wound Description Classification: Grade 1 Exudate Amount: Medium Exudate Type: Serosanguineous Exudate  Color: red, brown Electronic Signature(s) Signed: 05/12/2023 8:01:26 AM By: Yevonne Pax RN Entered By: Yevonne Pax on 04/17/2023 05:52:24 [name] ([id number]) [id number] Page 8 of 8 -------------------------------------------------------------------------------- Vitals Details Patient Name: Date of Service: Harold Mould M. 04/17/2023 8:45 A M Medical Record Number: [id number] Patient Account Number: [id number] Date of Birth/Sex: Treating RN: 07-18-1960 (62 y.o. [name]) Yevonne Pax Primary Care Braven Wolk: [name] Other Clinician: Referring Pearson Reasons: Treating Cheston Coury/Extender: [name] in Treatment: 6 Vital Signs Time Taken: 08:46 Temperature (F): 97.7 Height (in): 68 Pulse (bpm): 60 Weight (lbs): 209 Respiratory Rate (breaths/min): 18 Body Mass Index (BMI): 31.8 Blood Pressure (mmHg): 163/79 Reference Range: 80 - 120 mg / dl Electronic Signature(s) Signed: 05/12/2023 8:01:26 AM By: Yevonne Pax RN Entered By: Yevonne Pax on 04/17/2023 05:47:02  in Volume: Grade 1 Classification: Medium Exudate A mount: Serosanguineous Exudate Type: red, brown Exudate Color:] [N/A:N/A N/A N/A N/A N/A N/A N/A N/A N/A  N/A N/A N/A N/A N/A N/A N/A N/A] Treatment Notes Electronic Signature(s) Signed: 05/12/2023 8:01:26 AM By: Yevonne Pax RN Entered By: Yevonne Pax on 04/17/2023 05:55:52 [name] ([id number]) [id number] Page 5 of 8 -------------------------------------------------------------------------------- Multi-Disciplinary Care Plan Details Patient Name: Date of Service: Harold Lovely [facility] M. 04/17/2023 8:45 A M Medical Record Number: [id number] Patient Account Number: [id number] Date of Birth/Sex: Treating RN: [birth year] (62 y.o. [name]) Yevonne Pax Primary Care Eilis Chestnutt: [name] Other Clinician: Referring Hanin Decook: Treating Kyrstan Gotwalt/Extender: [name] in Treatment: 6 Active Inactive Wound/Skin Impairment Nursing Diagnoses: Impaired tissue integrity Knowledge deficit related to ulceration/compromised skin integrity Goals: Patient/caregiver will verbalize understanding of skin care regimen Date Initiated: 03/04/2023 Target Resolution Date: 05/10/2023 Goal Status: Active Ulcer/skin breakdown will have a volume reduction of 30% by week  4 Date Initiated: 03/04/2023 Date Inactivated: 04/10/2023 Target Resolution Date: 04/04/2023 Goal Status: Unmet Unmet Reason: comorbidties Ulcer/skin breakdown will have a volume reduction of 50% by week 8 Date Initiated: 03/04/2023 Target Resolution Date: 05/04/2023 Goal Status: Active Ulcer/skin breakdown will have a volume reduction of 80% by week 12 Date Initiated: 03/04/2023 Target Resolution Date: 06/04/2023 Goal Status: Active Ulcer/skin breakdown will heal within 14 weeks Date Initiated: 03/04/2023 Target Resolution Date: 06/18/2023 Goal Status: Active Interventions: Assess patient/caregiver ability to obtain necessary supplies Assess patient/caregiver ability to perform ulcer/skin care regimen upon admission and as needed Assess ulceration(s) every visit Provide education on ulcer and skin care Treatment Activities: Referred to DME Elige Shouse for dressing supplies : 03/04/2023 Skin care regimen initiated : 03/04/2023 Notes: Electronic Signature(s) Signed: 05/12/2023 8:01:26 AM By: Yevonne Pax RN Entered By: Yevonne Pax on 04/17/2023 05:56:02 [name] ([id number]) [id number] Page 6 of 8 -------------------------------------------------------------------------------- Pain Assessment Details Patient Name: Date of Service: Harold Mould M. 04/17/2023 8:45 A M Medical Record Number: [id number] Patient Account Number: [id number] Date of Birth/Sex: Treating RN: [birth year] (62 y.o. [name]) Yevonne Pax Primary Care Fahed Morten: [name] Other Clinician: Referring Lesley Galentine: Treating Hollin Crewe/Extender: [name] in Treatment: 6 Active Problems Location of Pain Severity and Description of Pain Patient Has Paino No Site Locations Pain Management and Medication Current Pain Management: Electronic Signature(s) Signed: 05/12/2023 8:01:26 AM By: Yevonne Pax RN Entered By: Yevonne Pax on 04/17/2023  05:47:18 -------------------------------------------------------------------------------- Patient/Caregiver Education Details Patient Name: Date of Service: Harold Mote. 10/3/2024andnbsp8:45 A M Medical Record Number: [id number] Patient Account Number: [id number] Date of Birth/Gender: Treating RN: 01/05/1961 (62 y.o. [name] Primary Care Physician: [name] Other Clinician: Referring Physician: Treating Physician/Extender: [name] in Treatment: 6 DELANTE, LLANOS Brandon ([id number]) [id number] Page 7 of 8 Education Assessment Education Provided To: Patient Education Topics Provided Wound/Skin Impairment: Handouts: Caring for Your Ulcer Methods: Explain/Verbal Responses: State content correctly Electronic Signature(s) Signed: 05/12/2023 8:01:26 AM By: Yevonne Pax RN Entered By: Yevonne Pax on 04/17/2023 05:56:15 -------------------------------------------------------------------------------- Wound Assessment Details Patient Name: Date of Service: Harold Lovely [facility] M. 04/17/2023 8:45 A M Medical Record Number: [id number] Patient Account Number: [id number] Date of Birth/Sex: Treating RN: February 06, 1961 (62 y.o. [name] Primary Care Rajat Staver: [name] Other Clinician: Referring Fortune Torosian: Treating Aemon Koeller/Extender: [name] in Treatment: 6 Wound Status Wound Number: 2 Primary Etiology: Diabetic Wound/Ulcer of the Lower Extremity Wound Location: Right, Plantar Foot Wound Status: Open Wounding Event: Pressure  in Volume: Grade 1 Classification: Medium Exudate A mount: Serosanguineous Exudate Type: red, brown Exudate Color:] [N/A:N/A N/A N/A N/A N/A N/A N/A N/A N/A  N/A N/A N/A N/A N/A N/A N/A N/A] Treatment Notes Electronic Signature(s) Signed: 05/12/2023 8:01:26 AM By: Yevonne Pax RN Entered By: Yevonne Pax on 04/17/2023 05:55:52 [name] ([id number]) [id number] Page 5 of 8 -------------------------------------------------------------------------------- Multi-Disciplinary Care Plan Details Patient Name: Date of Service: Harold Lovely [facility] M. 04/17/2023 8:45 A M Medical Record Number: [id number] Patient Account Number: [id number] Date of Birth/Sex: Treating RN: [birth year] (62 y.o. [name]) Yevonne Pax Primary Care Eilis Chestnutt: [name] Other Clinician: Referring Hanin Decook: Treating Kyrstan Gotwalt/Extender: [name] in Treatment: 6 Active Inactive Wound/Skin Impairment Nursing Diagnoses: Impaired tissue integrity Knowledge deficit related to ulceration/compromised skin integrity Goals: Patient/caregiver will verbalize understanding of skin care regimen Date Initiated: 03/04/2023 Target Resolution Date: 05/10/2023 Goal Status: Active Ulcer/skin breakdown will have a volume reduction of 30% by week  4 Date Initiated: 03/04/2023 Date Inactivated: 04/10/2023 Target Resolution Date: 04/04/2023 Goal Status: Unmet Unmet Reason: comorbidties Ulcer/skin breakdown will have a volume reduction of 50% by week 8 Date Initiated: 03/04/2023 Target Resolution Date: 05/04/2023 Goal Status: Active Ulcer/skin breakdown will have a volume reduction of 80% by week 12 Date Initiated: 03/04/2023 Target Resolution Date: 06/04/2023 Goal Status: Active Ulcer/skin breakdown will heal within 14 weeks Date Initiated: 03/04/2023 Target Resolution Date: 06/18/2023 Goal Status: Active Interventions: Assess patient/caregiver ability to obtain necessary supplies Assess patient/caregiver ability to perform ulcer/skin care regimen upon admission and as needed Assess ulceration(s) every visit Provide education on ulcer and skin care Treatment Activities: Referred to DME Elige Shouse for dressing supplies : 03/04/2023 Skin care regimen initiated : 03/04/2023 Notes: Electronic Signature(s) Signed: 05/12/2023 8:01:26 AM By: Yevonne Pax RN Entered By: Yevonne Pax on 04/17/2023 05:56:02 [name] ([id number]) [id number] Page 6 of 8 -------------------------------------------------------------------------------- Pain Assessment Details Patient Name: Date of Service: Harold Mould M. 04/17/2023 8:45 A M Medical Record Number: [id number] Patient Account Number: [id number] Date of Birth/Sex: Treating RN: [birth year] (62 y.o. [name]) Yevonne Pax Primary Care Fahed Morten: [name] Other Clinician: Referring Lesley Galentine: Treating Hollin Crewe/Extender: [name] in Treatment: 6 Active Problems Location of Pain Severity and Description of Pain Patient Has Paino No Site Locations Pain Management and Medication Current Pain Management: Electronic Signature(s) Signed: 05/12/2023 8:01:26 AM By: Yevonne Pax RN Entered By: Yevonne Pax on 04/17/2023  05:47:18 -------------------------------------------------------------------------------- Patient/Caregiver Education Details Patient Name: Date of Service: Harold Mote. 10/3/2024andnbsp8:45 A M Medical Record Number: [id number] Patient Account Number: [id number] Date of Birth/Gender: Treating RN: 01/05/1961 (62 y.o. [name] Primary Care Physician: [name] Other Clinician: Referring Physician: Treating Physician/Extender: [name] in Treatment: 6 DELANTE, LLANOS Brandon ([id number]) [id number] Page 7 of 8 Education Assessment Education Provided To: Patient Education Topics Provided Wound/Skin Impairment: Handouts: Caring for Your Ulcer Methods: Explain/Verbal Responses: State content correctly Electronic Signature(s) Signed: 05/12/2023 8:01:26 AM By: Yevonne Pax RN Entered By: Yevonne Pax on 04/17/2023 05:56:15 -------------------------------------------------------------------------------- Wound Assessment Details Patient Name: Date of Service: Harold Lovely HN M. 04/17/2023 8:45 A M Medical Record Number: [id number] Patient Account Number: [id number] Date of Birth/Sex: Treating RN: February 06, 1961 (62 y.o. [name] Primary Care Rajat Staver: [name] Other Clinician: Referring Fortune Torosian: Treating Aemon Koeller/Extender: [name] in Treatment: 6 Wound Status Wound Number: 2 Primary Etiology: Diabetic Wound/Ulcer of the Lower Extremity Wound Location: Right, Plantar Foot Wound Status: Open Wounding Event: Pressure  LUDIE, SCHRAUTH [place] ([id number]) [id number] Page 1 of 8 Visit Report for 04/17/2023 Arrival Information Details Patient Name: Date of Service: Harold Lovely [facility] M. 04/17/2023 8:45 A M Medical Record Number: [id number] Patient Account Number: [id number] Date of Birth/Sex: Treating RN: 1961-02-23 (62 y.o. [name]) Yevonne Pax Primary Care Vestal Markin: [name] Other Clinician: Referring Abdiaziz Klahn: Treating Jerryl Holzhauer/Extender: [name] in Treatment: 6 Visit Information History Since Last Visit Added or deleted any medications: No Patient Arrived: Ambulatory Any new allergies or adverse reactions: No Arrival Time: 08:43 Had a fall or experienced change in No Accompanied By: self activities of daily living that may affect Transfer Assistance: None risk of falls: Patient Identification Verified: Yes Signs or symptoms of abuse/neglect since last visito No Secondary Verification Process Completed: Yes Hospitalized since last visit: No Patient Requires Transmission-Based Precautions: No Implantable device outside of the clinic excluding No Patient Has Alerts: Yes cellular tissue based products placed in the center Patient Alerts: Diabetic Type 2 since last visit: Has Dressing in Place as Prescribed: Yes Has Compression in Place as Prescribed: Yes Pain Present Now: No Electronic Signature(s) Signed: 05/12/2023 8:01:26 AM By: Yevonne Pax RN Entered By: Yevonne Pax on 04/17/2023 05:46:21 -------------------------------------------------------------------------------- Clinic Level of Care Assessment Details Patient Name: Date of Service: Harold Mould M. 04/17/2023 8:45 A M Medical Record Number: [id number] Patient Account Number: [id number] Date of Birth/Sex: Treating RN: 01/19/1961 (62 y.o. [name]) Yevonne Pax Primary Care Savina Olshefski: [name] Other Clinician: Referring Dail Meece: Treating Tamira Ryland/Extender: [name] in Treatment: 6 Clinic Level of Care Assessment Items TOOL 4 Quantity Score []  - 0 Use when only an EandM is performed on FOLLOW-UP visit ASSESSMENTS - Nursing Assessment / Reassessment []  - 0 Reassessment of Co-morbidities (includes updates in patient status) []  - 0 Reassessment of Adherence to Treatment Plan SIGISMUND, MESKILL ([id number]) [id number] Page 2 of 8 ASSESSMENTS - Wound and Skin A ssessment / Reassessment []  - 0 Simple Wound Assessment / Reassessment - one wound []  - 0 Complex Wound Assessment / Reassessment - multiple wounds []  - 0 Dermatologic / Skin Assessment (not related to wound area) ASSESSMENTS - Focused Assessment []  - 0 Circumferential Edema Measurements - multi extremities []  - 0 Nutritional Assessment / Counseling / Intervention []  - 0 Lower Extremity Assessment (monofilament, tuning fork, pulses) []  - 0 Peripheral Arterial Disease Assessment (using hand held doppler) ASSESSMENTS - Ostomy and/or Continence Assessment and Care []  - 0 Incontinence Assessment and Management []  - 0 Ostomy Care Assessment and Management (repouching, etc.) PROCESS - Coordination of Care []  - 0 Simple Patient / Family Education for ongoing care []  - 0 Complex (extensive) Patient / Family Education for ongoing care []  - 0 Staff obtains Chiropractor, Records, T Results / Process Orders est []  - 0 Staff telephones HHA, Nursing Homes / Clarify orders / etc []  - 0 Routine Transfer to another Facility (non-emergent condition) []  - 0 Routine Hospital Admission (non-emergent condition) []  - 0 New Admissions / Manufacturing engineer / Ordering NPWT Apligraf, etc. , []  - 0 Emergency Hospital Admission (emergent condition) []  - 0 Simple Discharge Coordination []  - 0 Complex (extensive) Discharge Coordination PROCESS - Special Needs []  - 0 Pediatric / Minor Patient Management []  - 0 Isolation Patient  Management []  - 0 Hearing / Language / Visual special needs []  - 0 Assessment of Community assistance (transportation, D/C planning, etc.) []  - 0 Additional assistance / Altered mentation []  - 0 Support Surface(s) Assessment (bed, cushion,

## 2023-05-12 NOTE — Progress Notes (Signed)
0 Ankle / Brachial Index (ABI) - do not check if billed separately Has the patient been seen at the hospital within the last three years: Yes Total Score: 0 Level Of Care: ____ Electronic Signature(s) Signed: 05/12/2023 8:01:26 AM By: Yevonne Pax RN Entered By: Yevonne Pax on 04/24/2023 10:43:05 -------------------------------------------------------------------------------- Encounter Discharge Information Details Patient Name: Date of Service: [name] HN M. 04/24/2023 1:30 PM Medical Record Number: [id number] Patient Account Number: [id number] Date of Birth/Sex: Treating RN: 08-04-1960 ([address] y.o. [name]) Michele, Risner [place] ([id number]) [id number] Page 3 of 8 Primary Care Santanna Olenik: [name] Other Clinician: Referring Elsia Lasota: Treating Adonijah Baena/Extender: [name] in Treatment: 7 Encounter Discharge Information Items Discharge Condition: Stable Ambulatory Status: Ambulatory Discharge Destination: Home Transportation: Private Auto Accompanied By: self Schedule Follow-up Appointment: Yes Clinical Summary of Care: Electronic Signature(s) Signed: 04/24/2023 2:04:15 PM By: Yevonne Pax RN Entered By: Yevonne Pax on 04/24/2023 11:04:14 -------------------------------------------------------------------------------- Lower Extremity Assessment Details Patient Name: Date of Service: Harold Mould M. 04/24/2023 1:30 PM Medical Record Number: [id number] Patient Account Number: [id number] Date of Birth/Sex: Treating RN: October 05, 1960 (62 y.o. [name] Primary Care Beata Beason: [name] Other Clinician: Referring Cerina Leary: Treating Jaydin Boniface/Extender: [name] in Treatment: 7 Edema Assessment Assessed: [Left: No] [Right: No] Edema: [Left: N] [Right: o] Vascular Assessment Pulses: Dorsalis Pedis Palpable: [Right:Yes] Extremity colors, hair growth, and conditions: Extremity Color: [Right:Normal] Hair Growth on Extremity: [Right:Yes] Temperature of Extremity: [Right:Warm] Capillary Refill: [Right:< 3 seconds] Dependent Rubor: [Right:No No] Toe Nail Assessment Left: Right: Thick: No Discolored: No Deformed: No Improper Length and Hygiene: No Electronic Signature(s) Signed: 05/12/2023 8:01:26 AM By: Yevonne Pax RN Entered By: Yevonne Pax on 04/24/2023 10:27:56 [name] ([id number]) [id number] Page 4 of 8 -------------------------------------------------------------------------------- Multi Wound Chart Details Patient Name: Date of Service: Harold Mould M. 04/24/2023 1:30 PM Medical Record Number: [id number] Patient Account Number: [id number] Date of Birth/Sex: Treating RN: [birth year] (62 y.o. 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 N/A N/A Comorbid History: 01/02/2023 N/A N/A Date Acquired: 7 N/A N/A Weeks of Treatment: Open N/A N/A Wound Status: No N/A N/A Wound Recurrence: 0.4x0.4x0.2 N/A N/A Measurements L x W x D (cm) 0.126 N/A N/A A (cm) : rea 0.025 N/A N/A Volume (cm) : -306.50% N/A N/A % Reduction in A rea: -316.70% N/A N/A % Reduction in  Volume: Grade 1 N/A N/A Classification: Medium N/A N/A Exudate A mount: Serosanguineous N/A N/A Exudate Type: red, brown N/A N/A Exudate Color: Large (67-100%) N/A N/A Granulation A mount: Pink N/A N/A Granulation Quality: None Present (0%) N/A N/A Necrotic A mount: Fat Layer (Subcutaneous Tissue): Yes N/A N/A Exposed Structures: Fascia: No Tendon: No Muscle: No Joint: No Bone: No Large (67-100%) N/A N/A Epithelialization: Treatment Notes Electronic Signature(s) Signed: 05/12/2023 8:01:26 AM By: Yevonne Pax RN Entered By: Yevonne Pax on 04/24/2023 10:28:00 [name] ([id number]) [id number] Page 5 of 8 -------------------------------------------------------------------------------- Multi-Disciplinary Care Plan Details Patient Name: Date of Service: [name] [facility] M. 04/24/2023 1:30 PM Medical Record Number: [id number] Patient Account Number: [id number] Date of Birth/Sex: Treating RN: June 19, 1961 [phone]  0 Ankle / Brachial Index (ABI) - do not check if billed separately Has the patient been seen at the hospital within the last three years: Yes Total Score: 0 Level Of Care: ____ Electronic Signature(s) Signed: 05/12/2023 8:01:26 AM By: Yevonne Pax RN Entered By: Yevonne Pax on 04/24/2023 10:43:05 -------------------------------------------------------------------------------- Encounter Discharge Information Details Patient Name: Date of Service: [name] HN M. 04/24/2023 1:30 PM Medical Record Number: [id number] Patient Account Number: [id number] Date of Birth/Sex: Treating RN: 08-04-1960 ([address] y.o. [name]) Michele, Risner [place] ([id number]) [id number] Page 3 of 8 Primary Care Santanna Olenik: [name] Other Clinician: Referring Elsia Lasota: Treating Adonijah Baena/Extender: [name] in Treatment: 7 Encounter Discharge Information Items Discharge Condition: Stable Ambulatory Status: Ambulatory Discharge Destination: Home Transportation: Private Auto Accompanied By: self Schedule Follow-up Appointment: Yes Clinical Summary of Care: Electronic Signature(s) Signed: 04/24/2023 2:04:15 PM By: Yevonne Pax RN Entered By: Yevonne Pax on 04/24/2023 11:04:14 -------------------------------------------------------------------------------- Lower Extremity Assessment Details Patient Name: Date of Service: Harold Mould M. 04/24/2023 1:30 PM Medical Record Number: [id number] Patient Account Number: [id number] Date of Birth/Sex: Treating RN: October 05, 1960 (62 y.o. [name] Primary Care Beata Beason: [name] Other Clinician: Referring Cerina Leary: Treating Jaydin Boniface/Extender: [name] in Treatment: 7 Edema Assessment Assessed: [Left: No] [Right: No] Edema: [Left: N] [Right: o] Vascular Assessment Pulses: Dorsalis Pedis Palpable: [Right:Yes] Extremity colors, hair growth, and conditions: Extremity Color: [Right:Normal] Hair Growth on Extremity: [Right:Yes] Temperature of Extremity: [Right:Warm] Capillary Refill: [Right:< 3 seconds] Dependent Rubor: [Right:No No] Toe Nail Assessment Left: Right: Thick: No Discolored: No Deformed: No Improper Length and Hygiene: No Electronic Signature(s) Signed: 05/12/2023 8:01:26 AM By: Yevonne Pax RN Entered By: Yevonne Pax on 04/24/2023 10:27:56 [name] ([id number]) [id number] Page 4 of 8 -------------------------------------------------------------------------------- Multi Wound Chart Details Patient Name: Date of Service: Harold Mould M. 04/24/2023 1:30 PM Medical Record Number: [id number] Patient Account Number: [id number] Date of Birth/Sex: Treating RN: [birth year] (62 y.o. 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 N/A N/A Comorbid History: 01/02/2023 N/A N/A Date Acquired: 7 N/A N/A Weeks of Treatment: Open N/A N/A Wound Status: No N/A N/A Wound Recurrence: 0.4x0.4x0.2 N/A N/A Measurements L x W x D (cm) 0.126 N/A N/A A (cm) : rea 0.025 N/A N/A Volume (cm) : -306.50% N/A N/A % Reduction in A rea: -316.70% N/A N/A % Reduction in  Volume: Grade 1 N/A N/A Classification: Medium N/A N/A Exudate A mount: Serosanguineous N/A N/A Exudate Type: red, brown N/A N/A Exudate Color: Large (67-100%) N/A N/A Granulation A mount: Pink N/A N/A Granulation Quality: None Present (0%) N/A N/A Necrotic A mount: Fat Layer (Subcutaneous Tissue): Yes N/A N/A Exposed Structures: Fascia: No Tendon: No Muscle: No Joint: No Bone: No Large (67-100%) N/A N/A Epithelialization: Treatment Notes Electronic Signature(s) Signed: 05/12/2023 8:01:26 AM By: Yevonne Pax RN Entered By: Yevonne Pax on 04/24/2023 10:28:00 [name] ([id number]) [id number] Page 5 of 8 -------------------------------------------------------------------------------- Multi-Disciplinary Care Plan Details Patient Name: Date of Service: [name] [facility] M. 04/24/2023 1:30 PM Medical Record Number: [id number] Patient Account Number: [id number] Date of Birth/Sex: Treating RN: June 19, 1961 [phone]  Harold, RIPPEL Z ([id number]) [id number] Page 1 of 8 Visit Report for 04/24/2023 Arrival Information Details Patient Name: Date of Service: [name] [facility] M. 04/24/2023 1:30 PM Medical Record Number: [id number] Patient Account Number: [id number] Date of Birth/Sex: Treating RN: 07-21-1960 (62 y.o. [name]) Yevonne Pax Primary Care Kailynn Satterly: [name] Other Clinician: Referring Stana Bayon: Treating Sparsh Callens/Extender: [name] in Treatment: 7 Visit Information History Since Last Visit Added or deleted any medications: No Patient Arrived: Ambulatory Any new allergies or adverse reactions: No Arrival Time: 13:18 Had a fall or experienced change in No Accompanied By: self activities of daily living that may affect Transfer Assistance: None risk of falls: Patient Identification Verified: Yes Signs or symptoms of abuse/neglect since last visito No Secondary Verification Process Completed: Yes Hospitalized since last visit: No Patient Requires Transmission-Based Precautions: No Implantable device outside of the clinic excluding No Patient Has Alerts: Yes cellular tissue based products placed in the center Patient Alerts: Diabetic Type 2 since last visit: Has Dressing in Place as Prescribed: Yes Has Compression in Place as Prescribed: Yes Has Footwear/Offloading in Place as Prescribed: Yes Left: T Contact Cast otal Pain Present Now: No Electronic Signature(s) Signed: 05/12/2023 8:01:26 AM By: Yevonne Pax RN Entered By: Yevonne Pax on 04/24/2023 10:19:20 -------------------------------------------------------------------------------- Clinic Level of Care Assessment Details Patient Name: Date of Service: Harold Mould M. 04/24/2023 1:30 PM Medical Record Number: [id number] Patient Account Number: [id number] Date of Birth/Sex: Treating RN: September 12, 1960 (62 y.o. [name]) Yevonne Pax Primary Care Shandell Jallow: [name] Other Clinician: Referring Tori Dattilio: Treating Chariti Havel/Extender: [name] in Treatment: 7 Clinic Level of Care Assessment Items TOOL 1 Quantity Score []  - 0 Use when EandM and Procedure is performed on INITIAL visit ASSESSMENTS - Nursing Assessment / Reassessment []  - 0 General Physical Exam (combine w/ comprehensive assessment (listed just below) when performed on new pt. [address]JAESHAWN, DEMSKI ([id number]) [id number].pdf Page 2 of 8 []  - 0 Comprehensive Assessment (HX, ROS, Risk Assessments, Wounds Hx, etc.) ASSESSMENTS - Wound and Skin Assessment / Reassessment []  - 0 Dermatologic / Skin Assessment (not related to wound area) ASSESSMENTS - Ostomy and/or Continence Assessment and Care []  - 0 Incontinence Assessment and Management []  - 0 Ostomy Care Assessment and Management (repouching, etc.) PROCESS - Coordination of Care []  - 0 Simple Patient / Family Education for ongoing care []  - 0 Complex (extensive) Patient / Family Education for ongoing care []  - 0 Staff obtains Chiropractor, Records, T Results / Process Orders est []  - 0 Staff telephones HHA, Nursing Homes / Clarify orders / etc []  - 0 Routine Transfer to another Facility (non-emergent condition) []  - 0 Routine Hospital Admission (non-emergent condition) []  - 0 New Admissions / Manufacturing engineer / Ordering NPWT Apligraf, etc. , []  - 0 Emergency Hospital Admission (emergent condition) PROCESS - Special Needs []  - 0 Pediatric / Minor Patient Management []  - 0 Isolation Patient Management []  - 0 Hearing / Language / Visual special needs []  - 0 Assessment of Community assistance (transportation, D/C planning, etc.) []  - 0 Additional assistance / Altered mentation []  - 0 Support Surface(s) Assessment (bed, cushion, seat, etc.) INTERVENTIONS - Miscellaneous []  - 0 External ear exam []  - 0 Patient Transfer (multiple staff / Nurse, adult  / Similar devices) []  - 0 Simple Staple / Suture removal (25 or less) []  - 0 Complex Staple / Suture removal (26 or more) []  - 0 Hypo/Hyperglycemic Management (do not check if billed separately) []  -  0 Ankle / Brachial Index (ABI) - do not check if billed separately Has the patient been seen at the hospital within the last three years: Yes Total Score: 0 Level Of Care: ____ Electronic Signature(s) Signed: 05/12/2023 8:01:26 AM By: Yevonne Pax RN Entered By: Yevonne Pax on 04/24/2023 10:43:05 -------------------------------------------------------------------------------- Encounter Discharge Information Details Patient Name: Date of Service: [name] HN M. 04/24/2023 1:30 PM Medical Record Number: [id number] Patient Account Number: [id number] Date of Birth/Sex: Treating RN: 08-04-1960 ([address] y.o. [name]) Michele, Risner [place] ([id number]) [id number] Page 3 of 8 Primary Care Santanna Olenik: [name] Other Clinician: Referring Elsia Lasota: Treating Adonijah Baena/Extender: [name] in Treatment: 7 Encounter Discharge Information Items Discharge Condition: Stable Ambulatory Status: Ambulatory Discharge Destination: Home Transportation: Private Auto Accompanied By: self Schedule Follow-up Appointment: Yes Clinical Summary of Care: Electronic Signature(s) Signed: 04/24/2023 2:04:15 PM By: Yevonne Pax RN Entered By: Yevonne Pax on 04/24/2023 11:04:14 -------------------------------------------------------------------------------- Lower Extremity Assessment Details Patient Name: Date of Service: Harold Mould M. 04/24/2023 1:30 PM Medical Record Number: [id number] Patient Account Number: [id number] Date of Birth/Sex: Treating RN: October 05, 1960 (62 y.o. [name] Primary Care Beata Beason: [name] Other Clinician: Referring Cerina Leary: Treating Jaydin Boniface/Extender: [name] in Treatment: 7 Edema Assessment Assessed: [Left: No] [Right: No] Edema: [Left: N] [Right: o] Vascular Assessment Pulses: Dorsalis Pedis Palpable: [Right:Yes] Extremity colors, hair growth, and conditions: Extremity Color: [Right:Normal] Hair Growth on Extremity: [Right:Yes] Temperature of Extremity: [Right:Warm] Capillary Refill: [Right:< 3 seconds] Dependent Rubor: [Right:No No] Toe Nail Assessment Left: Right: Thick: No Discolored: No Deformed: No Improper Length and Hygiene: No Electronic Signature(s) Signed: 05/12/2023 8:01:26 AM By: Yevonne Pax RN Entered By: Yevonne Pax on 04/24/2023 10:27:56 [name] ([id number]) [id number].pdf Page 4 of 8 -------------------------------------------------------------------------------- Multi Wound Chart Details Patient Name: Date of Service: Harold Mould M. 04/24/2023 1:30 PM Medical Record Number: [id number] Patient Account Number: [id number] Date of Birth/Sex: Treating RN: [birth year] (62 y.o. 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 N/A N/A Comorbid History: 01/02/2023 N/A N/A Date Acquired: 7 N/A N/A Weeks of Treatment: Open N/A N/A Wound Status: No N/A N/A Wound Recurrence: 0.4x0.4x0.2 N/A N/A Measurements L x W x D (cm) 0.126 N/A N/A A (cm) : rea 0.025 N/A N/A Volume (cm) : -306.50% N/A N/A % Reduction in A rea: -316.70% N/A N/A % Reduction in  Volume: Grade 1 N/A N/A Classification: Medium N/A N/A Exudate A mount: Serosanguineous N/A N/A Exudate Type: red, brown N/A N/A Exudate Color: Large (67-100%) N/A N/A Granulation A mount: Pink N/A N/A Granulation Quality: None Present (0%) N/A N/A Necrotic A mount: Fat Layer (Subcutaneous Tissue): Yes N/A N/A Exposed Structures: Fascia: No Tendon: No Muscle: No Joint: No Bone: No Large (67-100%) N/A N/A Epithelialization: Treatment Notes Electronic Signature(s) Signed: 05/12/2023 8:01:26 AM By: Yevonne Pax RN Entered By: Yevonne Pax on 04/24/2023 10:28:00 [name] ([id number]) [id number] Page 5 of 8 -------------------------------------------------------------------------------- Multi-Disciplinary Care Plan Details Patient Name: Date of Service: [name] [facility] M. 04/24/2023 1:30 PM Medical Record Number: [id number] Patient Account Number: [id number] Date of Birth/Sex: Treating RN: June 19, 1961 [phone]

## 2023-05-12 NOTE — Progress Notes (Signed)
[name], [name] ([id number]) [id number] Page 1 of 7 Visit Report for 04/22/2023 Arrival Information Details Patient Name: Date of Service: [name] [facility] M. 04/22/2023 3:15 PM Medical Record Number: [id number] Patient Account Number: [id number] Date of Birth/Sex: Treating RN: 02-17-1961 (62 y.o. [name]) Yevonne Pax Primary Care [name]: [name] Other Clinician: Referring [name]: Treating [name]/Extender: [name] in Treatment: 7 Visit Information History Since Last Visit Added or deleted any medications: No Patient Arrived: Ambulatory Any new allergies or adverse reactions: No Arrival Time: 15:20 Had a fall or experienced change in No Accompanied By: wife activities of daily living that may affect Transfer Assistance: None risk of falls: Patient Identification Verified: Yes Signs or symptoms of abuse/neglect since last visito No Secondary Verification Process Completed: Yes Hospitalized since last visit: No Patient Requires Transmission-Based Precautions: No Implantable device outside of the clinic excluding No Patient Has Alerts: Yes cellular tissue based products placed in the center Patient Alerts: Diabetic Type 2 since last visit: Has Dressing in Place as Prescribed: Yes Pain Present Now: No Electronic Signature(s) Signed: 05/12/2023 8:01:26 AM By: Yevonne Pax RN Entered By: Yevonne Pax on 04/22/2023 12:21:11 -------------------------------------------------------------------------------- Clinic Level of Care Assessment Details Patient Name: Date of Service: Harold Mould M. 04/22/2023 3:15 PM Medical Record Number: [id number] Patient Account Number: [id number] Date of Birth/Sex: Treating RN: [birth year] (62 y.o. [name] Primary Care Nakea Gouger: [name] Other Clinician: Referring Livie Vanderhoof: Treating Isidra Mings/Extender: [name] in  Treatment: 7 Clinic Level of Care Assessment Items TOOL 1 Quantity Score []  - 0 Use when EandM and Procedure is performed on INITIAL visit ASSESSMENTS - Nursing Assessment / Reassessment []  - 0 General Physical Exam (combine w/ comprehensive assessment (listed just below) when performed on new pt. evals) []  - 0 Comprehensive Assessment (HX, ROS, Risk Assessments, Wounds Hx, etc.) [name], [name] ([id number]) [id number] Page 2 of 7 ASSESSMENTS - Wound and Skin Assessment / Reassessment []  - 0 Dermatologic / Skin Assessment (not related to wound area) ASSESSMENTS - Ostomy and/or Continence Assessment and Care []  - 0 Incontinence Assessment and Management []  - 0 Ostomy Care Assessment and Management (repouching, etc.) PROCESS - Coordination of Care []  - 0 Simple Patient / Family Education for ongoing care []  - 0 Complex (extensive) Patient / Family Education for ongoing care []  - 0 Staff obtains Chiropractor, Records, T Results / Process Orders est []  - 0 Staff telephones HHA, Nursing Homes / Clarify orders / etc []  - 0 Routine Transfer to another Facility (non-emergent condition) []  - 0 Routine Hospital Admission (non-emergent condition) []  - 0 New Admissions / Manufacturing engineer / Ordering NPWT Apligraf, etc. , []  - 0 Emergency Hospital Admission (emergent condition) PROCESS - Special Needs []  - 0 Pediatric / Minor Patient Management []  - 0 Isolation Patient Management []  - 0 Hearing / Language / Visual special needs []  - 0 Assessment of Community assistance (transportation, D/C planning, etc.) []  - 0 Additional assistance / Altered mentation []  - 0 Support Surface(s) Assessment (bed, cushion, seat, etc.) INTERVENTIONS - Miscellaneous []  - 0 External ear exam []  - 0 Patient Transfer (multiple staff / Nurse, adult / Similar devices) []  - 0 Simple Staple / Suture removal (25 or less) []  - 0 Complex Staple / Suture removal (26 or  more) []  - 0 Hypo/Hyperglycemic Management (do not check if billed separately) []  - 0 Ankle / Brachial Index (ABI) - do not check if billed separately Has the patient been seen  Target Resolution Date: 05/04/2023 Goal Status: Active [name], [name] ([id number]) [id number] Page 5 of 7 Ulcer/skin breakdown will have a volume reduction of 80% by week 12 Date Initiated: 03/04/2023 Target Resolution Date: 06/04/2023 Goal Status: Active Ulcer/skin breakdown will heal within 14 weeks Date Initiated: 03/04/2023 Target Resolution Date: 06/18/2023 Goal Status: Active Interventions: Assess patient/caregiver ability to obtain necessary supplies Assess patient/caregiver ability to perform ulcer/skin care regimen upon admission and as needed Assess ulceration(s) every visit Provide education on ulcer and skin care Treatment Activities: Referred to DME [name] for dressing supplies : 03/04/2023 Skin care regimen initiated : 03/04/2023 Notes: Electronic Signature(s) Signed: 04/22/2023 4:09:41 PM By: Yevonne Pax RN Entered By: Yevonne Pax on 04/22/2023  13:09:40 -------------------------------------------------------------------------------- Pain Assessment Details Patient Name: Date of Service: Harold Mould M. 04/22/2023 3:15 PM Medical Record Number: [id number] Patient Account Number: [id number] Date of Birth/Sex: Treating RN: 05-03-1961 (62 y.o. [name] Primary Care Nicollette Wilhelmi: [name] Other Clinician: Referring Braelin Costlow: Treating Bishop Vanderwerf/Extender: [name] in Treatment: 7 Active Problems Location of Pain Severity and Description of Pain Patient Has Paino No Site Locations Pain Management and Medication Current Pain Management: Electronic Signature(s) Signed: 05/12/2023 8:01:26 AM By: Yevonne Pax RN [name] ([id number]) By: Yevonne Pax RN [phone].pdf Page 6 of 7 Signed: 05/12/2023 8:01:26 AM Entered By: Yevonne Pax on 04/22/2023 12:21:44 -------------------------------------------------------------------------------- Patient/Caregiver Education Details Patient Name: Date of Service: Harold Mote. 10/8/2024andnbsp3:15 PM Medical Record Number: [id number] Patient Account Number: [id number] Date of Birth/Gender: Treating RN: [birth year] (62 y.o. [name]) Yevonne Pax Primary Care Physician: [name] Other Clinician: Referring Physician: Treating Physician/Extender: [name] in Treatment: 7 Education Assessment Education Provided To: Patient Education Topics Provided Wound/Skin Impairment: Handouts: Other: cast care Methods: Explain/Verbal Responses: State content correctly Electronic Signature(s) Signed: 05/12/2023 8:01:26 AM By: Yevonne Pax RN Entered By: Yevonne Pax on 04/22/2023 13:10:01 -------------------------------------------------------------------------------- Wound Assessment Details Patient Name: Date of Service: Harold Mould M. 04/22/2023 3:15 PM Medical  Record Number: [id number] Patient Account Number: [id number] Date of Birth/Sex: Treating RN: Jun 08, 1961 (62 y.o. [name]) Yevonne Pax Primary Care Nicolis Boody: [name] Other Clinician: Referring Jahnae Mcadoo: Treating Jules Vidovich/Extender: [name] in Treatment: 7 Wound Status Wound Number: 2 Primary Etiology: Diabetic Wound/Ulcer of the Lower Extremity Wound Location: Right, Plantar Foot Wound Status: Open Wounding Event: Pressure Injury Comorbid History: Type II Diabetes Date Acquired: 01/02/2023 Weeks Of Treatment: 7 Clustered Wound: No Wound Measurements [name], [name] ([id number]) Length: (cm) 0.2 Width: (cm) 0.2 Depth: (cm) 0.2 Area: (cm) 0.0 Volume: (cm) 0.0 [id number] Page 7 of 7 % Reduction in Area: 0% % Reduction in Volume: 0% Epithelialization: Large (67-100%) 31 Tunneling: No 06 Undermining: No Wound Description Classification: Grade 1 Exudate Amount: None Present Foul Odor After Cleansing: No Slough/Fibrino No Wound Bed Granulation Amount: Large (67-100%) Exposed Structure Granulation Quality: Pink Fascia Exposed: No Necrotic Amount: None Present (0%) Fat Layer (Subcutaneous Tissue) Exposed: Yes Tendon Exposed: No Muscle Exposed: No Joint Exposed: No Bone Exposed: No Electronic Signature(s) Signed: 05/12/2023 8:01:26 AM By: Yevonne Pax RN Entered By: Yevonne Pax on 04/22/2023 12:28:58 -------------------------------------------------------------------------------- Vitals Details Patient Name: Date of Service: Harold Mould M. 04/22/2023 3:15 PM Medical Record Number: [id number] Patient Account Number: [id number] Date of Birth/Sex: Treating RN: March 03, 1961 (62 y.o. [name] Primary Care Kwinton Maahs: [name] Other Clinician: Referring Juanda Luba: Treating Tatelyn Vanhecke/Extender: [name] in Treatment: 7 Vital Signs Time Taken:  15:21 Temperature (F):  Target Resolution Date: 05/04/2023 Goal Status: Active [name], [name] ([id number]) [id number] Page 5 of 7 Ulcer/skin breakdown will have a volume reduction of 80% by week 12 Date Initiated: 03/04/2023 Target Resolution Date: 06/04/2023 Goal Status: Active Ulcer/skin breakdown will heal within 14 weeks Date Initiated: 03/04/2023 Target Resolution Date: 06/18/2023 Goal Status: Active Interventions: Assess patient/caregiver ability to obtain necessary supplies Assess patient/caregiver ability to perform ulcer/skin care regimen upon admission and as needed Assess ulceration(s) every visit Provide education on ulcer and skin care Treatment Activities: Referred to DME [name] for dressing supplies : 03/04/2023 Skin care regimen initiated : 03/04/2023 Notes: Electronic Signature(s) Signed: 04/22/2023 4:09:41 PM By: Yevonne Pax RN Entered By: Yevonne Pax on 04/22/2023  13:09:40 -------------------------------------------------------------------------------- Pain Assessment Details Patient Name: Date of Service: Harold Mould M. 04/22/2023 3:15 PM Medical Record Number: [id number] Patient Account Number: [id number] Date of Birth/Sex: Treating RN: 05-03-1961 (62 y.o. [name] Primary Care Nicollette Wilhelmi: [name] Other Clinician: Referring Braelin Costlow: Treating Bishop Vanderwerf/Extender: [name] in Treatment: 7 Active Problems Location of Pain Severity and Description of Pain Patient Has Paino No Site Locations Pain Management and Medication Current Pain Management: Electronic Signature(s) Signed: 05/12/2023 8:01:26 AM By: Yevonne Pax RN [name] ([id number]) By: Yevonne Pax RN [phone].pdf Page 6 of 7 Signed: 05/12/2023 8:01:26 AM Entered By: Yevonne Pax on 04/22/2023 12:21:44 -------------------------------------------------------------------------------- Patient/Caregiver Education Details Patient Name: Date of Service: Harold Mote. 10/8/2024andnbsp3:15 PM Medical Record Number: [id number] Patient Account Number: [id number] Date of Birth/Gender: Treating RN: [birth year] (62 y.o. [name]) Yevonne Pax Primary Care Physician: [name] Other Clinician: Referring Physician: Treating Physician/Extender: [name] in Treatment: 7 Education Assessment Education Provided To: Patient Education Topics Provided Wound/Skin Impairment: Handouts: Other: cast care Methods: Explain/Verbal Responses: State content correctly Electronic Signature(s) Signed: 05/12/2023 8:01:26 AM By: Yevonne Pax RN Entered By: Yevonne Pax on 04/22/2023 13:10:01 -------------------------------------------------------------------------------- Wound Assessment Details Patient Name: Date of Service: Harold Mould M. 04/22/2023 3:15 PM Medical  Record Number: [id number] Patient Account Number: [id number] Date of Birth/Sex: Treating RN: Jun 08, 1961 (62 y.o. [name]) Yevonne Pax Primary Care Nicolis Boody: [name] Other Clinician: Referring Jahnae Mcadoo: Treating Jules Vidovich/Extender: [name] in Treatment: 7 Wound Status Wound Number: 2 Primary Etiology: Diabetic Wound/Ulcer of the Lower Extremity Wound Location: Right, Plantar Foot Wound Status: Open Wounding Event: Pressure Injury Comorbid History: Type II Diabetes Date Acquired: 01/02/2023 Weeks Of Treatment: 7 Clustered Wound: No Wound Measurements [name], [name] ([id number]) Length: (cm) 0.2 Width: (cm) 0.2 Depth: (cm) 0.2 Area: (cm) 0.0 Volume: (cm) 0.0 [id number] Page 7 of 7 % Reduction in Area: 0% % Reduction in Volume: 0% Epithelialization: Large (67-100%) 31 Tunneling: No 06 Undermining: No Wound Description Classification: Grade 1 Exudate Amount: None Present Foul Odor After Cleansing: No Slough/Fibrino No Wound Bed Granulation Amount: Large (67-100%) Exposed Structure Granulation Quality: Pink Fascia Exposed: No Necrotic Amount: None Present (0%) Fat Layer (Subcutaneous Tissue) Exposed: Yes Tendon Exposed: No Muscle Exposed: No Joint Exposed: No Bone Exposed: No Electronic Signature(s) Signed: 05/12/2023 8:01:26 AM By: Yevonne Pax RN Entered By: Yevonne Pax on 04/22/2023 12:28:58 -------------------------------------------------------------------------------- Vitals Details Patient Name: Date of Service: Harold Mould M. 04/22/2023 3:15 PM Medical Record Number: [id number] Patient Account Number: [id number] Date of Birth/Sex: Treating RN: March 03, 1961 (62 y.o. [name] Primary Care Kwinton Maahs: [name] Other Clinician: Referring Juanda Luba: Treating Tatelyn Vanhecke/Extender: [name] in Treatment: 7 Vital Signs Time Taken:  15:21 Temperature (F):  at the hospital within the last three years: Yes Total Score: 0 Level Of Care: ____ Electronic Signature(s) Signed: 05/12/2023 8:01:26 AM By: Yevonne Pax RN Entered By: Yevonne Pax on 04/22/2023 12:32:49 -------------------------------------------------------------------------------- Encounter Discharge Information Details Patient Name: Date of Service: Harold Mould M. 04/22/2023 3:15 PM Medical Record Number: [id number] Patient Account Number: [id number] Date of Birth/Sex: Treating RN: 08/01/1960 (62 y.o. [name] Primary Care Kapono Luhn: [name] Other Clinician: Referring Shereese Bonnie: Treating Jahnya Trindade/Extender: [name] in Treatment: 7 [name], [name] ([id number]) [id number] Page 3 of 7 Encounter Discharge Information Items Post Procedure Vitals Discharge Condition: Stable Temperature (F): 98 Ambulatory Status: Ambulatory Pulse (bpm): 60 Discharge Destination: Home Respiratory Rate (breaths/min): 16 Transportation: Private Auto Blood Pressure (mmHg): 140/76 Accompanied By: self Schedule Follow-up Appointment: No Clinical Summary of Care: Electronic Signature(s) Signed: 04/22/2023 4:18:37 PM By: Yevonne Pax RN Entered By: Yevonne Pax on 04/22/2023 13:18:37 -------------------------------------------------------------------------------- Lower Extremity Assessment Details Patient Name: Date of Service: Harold Mould M. 04/22/2023 3:15 PM Medical Record Number: [id number] Patient Account Number: [id number] Date of Birth/Sex: Treating RN: 14-Mar-1961 (62 y.o. [name]) Yevonne Pax Primary Care Rodina Pinales: [name] Other Clinician: Referring Tonishia Steffy: Treating Kolleen Ochsner/Extender: [name] in Treatment: 7 Electronic Signature(s) Signed: 04/22/2023 4:28:55 PM By: Yevonne Pax RN Entered By: Yevonne Pax on 04/22/2023 13:28:54 -------------------------------------------------------------------------------- Multi Wound Chart Details Patient Name: Date of Service: [name] HN M. 04/22/2023 3:15 PM Medical Record Number: [id number] Patient Account Number: [id number] Date of Birth/Sex: Treating RN: [birth year] (62 y.o. [name]) Yevonne Pax Primary Care Trini Christiansen: [name] Other Clinician: Referring [name]: Treating [name]/Extender: [name] in Treatment: 7 Vital Signs Height(in): 68 Pulse(bpm): 60 Weight(lbs): 209 Blood Pressure(mmHg): 140/76 Body Mass Index(BMI): 31.8 Temperature(F): 98 Respiratory Rate(breaths/min): 16 [2:Photos:] [name], [name] ([id number]) [2:Photos: Right, Plantar Foot Wound Location: Pressure Injury Wounding Event: Diabetic Wound/Ulcer of the Lower Primary Etiology: Extremity Type II Diabetes Comorbid History: 01/02/2023 Date Acquired: 7 Weeks of Treatment: Open Wound Status: No Wound  Recurrence: 0.2x0.2x0.2 Measurements L x W x D (cm) 0.031 A (cm) : rea 0.006 Volume (cm) : 0.00% % Reduction in A rea: 0.00% % Reduction in Volume: Grade 1 Classification: None Present Exudate A mount: Large (67-100%) Granulation A mount: Pink  Granulation Quality: None Present (0%) Necrotic A mount: Fat Layer (Subcutaneous Tissue): Yes N/A Exposed Structures: Fascia: No Tendon: No Muscle: No Joint: No Bone: No Large (67-100%) Epithelialization:] [N/A:No Photos N/A N/A N/A N/A N/A N/A N/A N/A  N/A N/A N/A N/A N/A N/A N/A N/A N/A N/A N/A N/A] Treatment Notes Electronic Signature(s) Signed: 05/12/2023 8:01:26 AM By: Yevonne Pax RN Entered By: Yevonne Pax on 04/22/2023 12:30:24 -------------------------------------------------------------------------------- Multi-Disciplinary Care Plan Details Patient  Name: Date of Service: [name] HN M. 04/22/2023 3:15 PM Medical Record Number: [id number] Patient Account Number: [id number] Date of Birth/Sex: Treating RN: 1961/02/17 (62 y.o. [name] Primary Care Fate Galanti: [name] Other Clinician: Referring Ashya Nicolaisen: Treating Jahari Billy/Extender: [name] in Treatment: 7 Active Inactive Wound/Skin Impairment Nursing Diagnoses: Impaired tissue integrity Knowledge deficit related to ulceration/compromised skin integrity Goals: Patient/caregiver will verbalize understanding of skin care regimen Date Initiated: 03/04/2023 Target Resolution Date: 05/10/2023 Goal Status: Active Ulcer/skin breakdown will have a volume reduction of 30% by week 4 Date Initiated: 03/04/2023 Date Inactivated: 04/10/2023 Target Resolution Date: 04/04/2023 Goal Status: Unmet Unmet Reason: comorbidties Ulcer/skin breakdown will have a volume reduction of 50% by week 8 Date Initiated: 03/04/2023

## 2023-05-13 ENCOUNTER — Encounter: Payer: Medicaid Other | Admitting: Physician Assistant

## 2023-05-13 DIAGNOSIS — E11622 Type 2 diabetes mellitus with other skin ulcer: Secondary | ICD-10-CM | POA: Diagnosis not present

## 2023-05-13 DIAGNOSIS — E1151 Type 2 diabetes mellitus with diabetic peripheral angiopathy without gangrene: Secondary | ICD-10-CM | POA: Diagnosis not present

## 2023-05-13 DIAGNOSIS — I129 Hypertensive chronic kidney disease with stage 1 through stage 4 chronic kidney disease, or unspecified chronic kidney disease: Secondary | ICD-10-CM | POA: Diagnosis not present

## 2023-05-13 DIAGNOSIS — N184 Chronic kidney disease, stage 4 (severe): Secondary | ICD-10-CM | POA: Diagnosis not present

## 2023-05-13 DIAGNOSIS — E1122 Type 2 diabetes mellitus with diabetic chronic kidney disease: Secondary | ICD-10-CM | POA: Diagnosis not present

## 2023-05-13 DIAGNOSIS — E11621 Type 2 diabetes mellitus with foot ulcer: Secondary | ICD-10-CM | POA: Diagnosis not present

## 2023-05-13 DIAGNOSIS — L97512 Non-pressure chronic ulcer of other part of right foot with fat layer exposed: Secondary | ICD-10-CM | POA: Diagnosis not present

## 2023-05-13 NOTE — Progress Notes (Signed)
PA-C Entered By: Allen Derry on 05/13/2023 14:30:14 -------------------------------------------------------------------------------- Physical Exam Details Patient Name: Date of Service: [name] M. 05/13/2023 10:45 A M Medical Record Number: [id number] Patient Account Number: [id number] Date of Birth/Sex: Treating RN: September 27, 1960 (62 y.o. [name] Primary Care Provider: [name] Other Clinician: Betha Loa Referring Provider: Treating Provider/Extender: [name] in Treatment: 10 Constitutional Well-nourished and well-hydrated in no acute distress. Respiratory normal breathing without difficulty. Psychiatric this patient is able to make decisions and demonstrates good insight into disease process. Alert and Oriented x 3. pleasant and cooperative. Notes Upon inspection patient's wound bed actually showed signs of good granulation and epithelization at this point. Fortunately I do not see any signs of overall the patient's wound worsening we Argun to perform some debridement clearway necrotic debris and callus around the edges of the wound he tolerated that today without complication and postdebridement the wound bed actually appears to be doing much better which is great news. Electronic Signature(s) Signed: 05/13/2023 2:30:38 PM By: Allen Derry PA-C Entered By: Allen Derry on 05/13/2023 14:30:38 -------------------------------------------------------------------------------- Physician Orders Details Patient Name: Date of Service: Harold Lovely HN M. 05/13/2023 10:45 A M Medical Record Number: [id number] Patient Account Number: [id number] Date of Birth/Sex: Treating RN: [birth year] (62 y.o. 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 Bathing/ Shower/ Hygiene May shower; gently cleanse wound with antibacterial soap, rinse and pat dry prior to dressing wounds Anesthetic (Use 'Patient Medications' Section for Anesthetic Order Entry) Lidocaine applied to wound bed Wound Treatment Wound #2 - Foot Wound Laterality: Plantar, Right Cleanser: Soap and Water Every Other Day/30 Days Discharge Instructions: Gently cleanse wound with antibacterial soap, rinse and pat dry prior to dressing wounds Prim Dressing: Hydrofera Blue Ready Transfer Foam, 2.5x2.5 (in/in) (Dispense As Written) Every Other Day/30 Days ary Discharge Instructions: Apply Hydrofera Blue Ready to wound bed as directed Secondary Dressing: (BORDER) Zetuvit Plus SILICONE BORDER Dressing 4x4 (in/in) (DME) (Dispense As Written) Every Other Day/30 Days Discharge Instructions: Please do not put silicone bordered dressings under wraps. Use non-bordered dressing only. Electronic Signature(s) Signed: 05/13/2023 5:26:09 PM By: Betha Loa Signed: 05/13/2023 6:10:41 PM By: Allen Derry PA-C Entered By: Betha Loa on 05/13/2023 11:27:02 -------------------------------------------------------------------------------- Problem List Details Patient Name: Date of Service: Harold Lovely HN M. 05/13/2023 10:45 A M Medical Record Number: [id number] Patient Account Number: [id number] Date of Birth/Sex: Treating  RN: August 16, 1960 (62 y.o. [name] Primary Care Provider: [name] Other Clinician: Betha Loa Referring Provider: Treating Provider/Extender: [name] in Treatment: 10 Active Problems ICD-10 Encounter Code Description Active Date MDM Diagnosis E11.621 Type 2 diabetes mellitus with foot ulcer 03/04/2023 No Yes JACQUEL, FAGO ([id number]) [id number] Page 5 of 8 [id number] Non-pressure chronic ulcer of other part of right foot with fat layer exposed 03/04/2023 No Yes I73.89 Other specified peripheral vascular diseases 03/04/2023 No Yes N18.4 Chronic kidney disease, stage  DYLLAN, SCHLEETER ([id number]) [id number] Page 1 of 8 Visit Report for 05/13/2023 Chief Complaint Document Details Patient Name: Date of Service: Harold Lovely [facility] M. 05/13/2023 10:45 A M Medical Record Number: [id number] Patient Account Number: [id number] Date of Birth/Sex: Treating RN: 24-Oct-1960 (62 y.o. [name] Primary Care Provider: [name] Other Clinician: Betha Loa Referring Provider: Treating Provider/Extender: [name] in Treatment: 10 Information Obtained from: Patient Chief Complaint Right plantar foot ulcer Electronic Signature(s) Signed: 05/13/2023 10:46:37 AM By: Allen Derry PA-C Entered By: Allen Derry on 05/13/2023 10:46:37 -------------------------------------------------------------------------------- Debridement Details Patient Name: Date of Service: Harold Lovely HN M. 05/13/2023 10:45 A M Medical Record Number: [id number] Patient Account Number: [id number] Date of Birth/Sex: Treating RN: 11/13/1960 (62 y.o. [name] Primary Care Provider: [name] Other Clinician: Betha Loa Referring Provider: Treating Provider/Extender: [name] in Treatment: 10 Debridement Performed for Assessment: Wound #2 Right,Plantar Foot Performed By: Physician Allen Derry, PA-C The following information was scribed by: Betha Loa The information was scribed for: Allen Derry Debridement Type: Debridement Severity of Tissue Pre Debridement: Fat layer exposed Level of Consciousness (Pre-procedure): Awake and Alert Pre-procedure Verification/Time Out Yes - 11:23 Taken: Start Time: 11:23 Percent of Wound Bed Debrided: 100% T Area Debrided (cm): otal 0.05 Tissue and other material debrided: Viable, Non-Viable, Callus, Slough, Subcutaneous, Slough Level: Skin/Subcutaneous Tissue Debridement Description: Excisional Instrument:  Curette Bleeding: Minimum Hemostasis Achieved: Pressure GERHARDT, HUAMAN ([id number]) [id number] Page 2 of 8 Response to Treatment: Procedure was tolerated well Level of Consciousness (Post- Awake and Alert procedure): Post Debridement Measurements of Total Wound Length: (cm) 0.3 Width: (cm) 0.2 Depth: (cm) 0.2 Volume: (cm) 0.009 Character of Wound/Ulcer Post Debridement: Stable Severity of Tissue Post Debridement: Fat layer exposed Post Procedure Diagnosis Same as Pre-procedure Electronic Signature(s) Signed: 05/13/2023 5:26:09 PM By: Betha Loa Signed: 05/13/2023 6:10:41 PM By: Allen Derry PA-C Signed: 05/14/2023 5:05:03 PM By: Midge Aver MSN RN CNS WTA Entered By: Betha Loa on 05/13/2023 11:25:41 -------------------------------------------------------------------------------- HPI Details Patient Name: Date of Service: Harold Lovely HN M. 05/13/2023 10:45 A M Medical Record Number: [id number] Patient Account Number: [id number] Date of Birth/Sex: Treating RN: Jan 19, 1961 (61 y.o. [name] Primary Care Provider: [name] Other Clinician: Betha Loa Referring Provider: Treating Provider/Extender: [name] in Treatment: 10 History of Present Illness Chronic/Inactive Conditions Condition 1: 03-04-2023 patient's arterial screening today actually appears to show some signs of being somewhat low at 0.83 on the left 1.53 on the right which is essentially noncompressible. I really do not think we can assure ourselves of these readings and I think he really should see vascular for a thorough arterial evaluation in order to ensure that he has good blood flow although I suspect he probably does. HPI Description: 07/20/17 on evaluation today patient actually appears to be doing somewhat poorly in regard to his right plantar great toe ulcer. This is something that he states started as what he  thought to be a blister May 04, 2018. Subsequently he treated this on his own for a while thinking that it would just get better. As it did not get better he eventually did go to have this evaluated where it was treated appropriately with it sounds like Bactroban ointment however the patient states that it never completely resolved. Subsequently if this continue to get worse and was looking more irritated and inflamed he did go to the hospital more recently where he was prescribed Keflex.  DYLLAN, SCHLEETER ([id number]) [id number] Page 1 of 8 Visit Report for 05/13/2023 Chief Complaint Document Details Patient Name: Date of Service: Harold Lovely [facility] M. 05/13/2023 10:45 A M Medical Record Number: [id number] Patient Account Number: [id number] Date of Birth/Sex: Treating RN: 24-Oct-1960 (62 y.o. [name] Primary Care Provider: [name] Other Clinician: Betha Loa Referring Provider: Treating Provider/Extender: [name] in Treatment: 10 Information Obtained from: Patient Chief Complaint Right plantar foot ulcer Electronic Signature(s) Signed: 05/13/2023 10:46:37 AM By: Allen Derry PA-C Entered By: Allen Derry on 05/13/2023 10:46:37 -------------------------------------------------------------------------------- Debridement Details Patient Name: Date of Service: Harold Lovely HN M. 05/13/2023 10:45 A M Medical Record Number: [id number] Patient Account Number: [id number] Date of Birth/Sex: Treating RN: 11/13/1960 (62 y.o. [name] Primary Care Provider: [name] Other Clinician: Betha Loa Referring Provider: Treating Provider/Extender: [name] in Treatment: 10 Debridement Performed for Assessment: Wound #2 Right,Plantar Foot Performed By: Physician Allen Derry, PA-C The following information was scribed by: Betha Loa The information was scribed for: Allen Derry Debridement Type: Debridement Severity of Tissue Pre Debridement: Fat layer exposed Level of Consciousness (Pre-procedure): Awake and Alert Pre-procedure Verification/Time Out Yes - 11:23 Taken: Start Time: 11:23 Percent of Wound Bed Debrided: 100% T Area Debrided (cm): otal 0.05 Tissue and other material debrided: Viable, Non-Viable, Callus, Slough, Subcutaneous, Slough Level: Skin/Subcutaneous Tissue Debridement Description: Excisional Instrument:  Curette Bleeding: Minimum Hemostasis Achieved: Pressure GERHARDT, HUAMAN ([id number]) [id number] Page 2 of 8 Response to Treatment: Procedure was tolerated well Level of Consciousness (Post- Awake and Alert procedure): Post Debridement Measurements of Total Wound Length: (cm) 0.3 Width: (cm) 0.2 Depth: (cm) 0.2 Volume: (cm) 0.009 Character of Wound/Ulcer Post Debridement: Stable Severity of Tissue Post Debridement: Fat layer exposed Post Procedure Diagnosis Same as Pre-procedure Electronic Signature(s) Signed: 05/13/2023 5:26:09 PM By: Betha Loa Signed: 05/13/2023 6:10:41 PM By: Allen Derry PA-C Signed: 05/14/2023 5:05:03 PM By: Midge Aver MSN RN CNS WTA Entered By: Betha Loa on 05/13/2023 11:25:41 -------------------------------------------------------------------------------- HPI Details Patient Name: Date of Service: Harold Lovely HN M. 05/13/2023 10:45 A M Medical Record Number: [id number] Patient Account Number: [id number] Date of Birth/Sex: Treating RN: Jan 19, 1961 (61 y.o. [name] Primary Care Provider: [name] Other Clinician: Betha Loa Referring Provider: Treating Provider/Extender: [name] in Treatment: 10 History of Present Illness Chronic/Inactive Conditions Condition 1: 03-04-2023 patient's arterial screening today actually appears to show some signs of being somewhat low at 0.83 on the left 1.53 on the right which is essentially noncompressible. I really do not think we can assure ourselves of these readings and I think he really should see vascular for a thorough arterial evaluation in order to ensure that he has good blood flow although I suspect he probably does. HPI Description: 07/20/17 on evaluation today patient actually appears to be doing somewhat poorly in regard to his right plantar great toe ulcer. This is something that he states started as what he  thought to be a blister May 04, 2018. Subsequently he treated this on his own for a while thinking that it would just get better. As it did not get better he eventually did go to have this evaluated where it was treated appropriately with it sounds like Bactroban ointment however the patient states that it never completely resolved. Subsequently if this continue to get worse and was looking more irritated and inflamed he did go to the hospital more recently where he was prescribed Keflex.  4 (severe) 03/04/2023 No Yes I10 Essential (primary) hypertension 03/04/2023 No Yes Inactive Problems Resolved Problems Electronic Signature(s) Signed: 05/13/2023 10:46:34 AM By: Allen Derry PA-C Entered By: Allen Derry on 05/13/2023 10:46:33 -------------------------------------------------------------------------------- Progress Note Details Patient Name: Date of Service: Harold Lovely HN M. 05/13/2023 10:45 A M Medical Record Number: [id number] Patient Account Number: [id number] Date of Birth/Sex: Treating RN: [birth year] (62 y.o. [name] Primary Care Provider: [name] Other Clinician: Betha Loa Referring Provider: Treating Provider/Extender: [name] in Treatment: 10 Subjective Chief Complaint Information obtained from Patient Right plantar foot ulcer History of Present Illness (HPI) Chronic/Inactive Condition: 03-04-2023 patient's arterial screening today actually appears to show some signs of being somewhat low at 0.83 on the left 1.53 on the right which is essentially noncompressible. I really do not think we can assure ourselves of these readings and I think he really should see vascular for a thorough arterial evaluation in order to ensure that he has good blood flow  although I suspect he probably does. 07/20/17 on evaluation today patient actually appears to be doing somewhat poorly in regard to his right plantar great toe ulcer. This is something that he states started as what he thought to be a blister May 04, 2018. Subsequently he treated this on his own for a while thinking that it would just get better. As it did not get better he eventually did go to have this evaluated where it was treated appropriately with it sounds like Bactroban ointment however the patient states that it never completely resolved. Subsequently if this continue to get worse and was looking more irritated and inflamed he did go to the hospital more recently where he was prescribed Keflex. Subsequently he was also referred to the wound care center here for further evaluation by [place]. He had an x-ray in the ER which revealed no evidence of osteomyelitis that I explained to the patient that osteomyelitis can be present even in a negative x-ray as it's not nearly as sensitive as an MRI would be. No fevers, chills, nausea, or vomiting noted at this time. The patient tells me that this might look a little bit better since he started the antibiotics. He does have a history of diabetes mellitus type II he also had a horse accident years ago and had to have significant surgery in regard to his right lower extremity that did leave some deformity in this regard. This is mainly centered in the shin region. Readmission: 03-04-2023 upon evaluation today patient appears to be doing somewhat poorly today in regard to the wound on the right plantar foot. 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 With that being said the patient tells me at this point that he has been treating this just at home using some Betadine on the area but really nothing else has been  done up to this point. This is actually his first office for his care is concerned in a clinic setting. Patient's past medical history actually is very similar to previous when I saw him. He does have a history of diabetes mellitus type 2, peripheral vascular disease, chronic kidney disease stage IV, hypertension, he has not had any formal arterial testing up to this point. Patient is likely going require an x-ray HORUS, BELLOFATTO ([id number]) [id number] Page 6 of 8 today. 8/27;  Curette to remove Viable and Non-Viable tissue/material. Material removed includes Callus, Subcutaneous Tissue, and Slough. A time out was conducted at 11:23, prior to the start of the procedure. A Minimum amount of bleeding was controlled with Pressure. The procedure was tolerated well. Post Debridement Measurements: 0.3cm length x 0.2cm width x 0.2cm depth; 0.009cm^3 volume. Character of Wound/Ulcer Post Debridement is stable. Severity of Tissue Post Debridement is: Fat layer exposed. Post procedure Diagnosis Wound #2: Same as Pre-Procedure ZACHARIA, HAGARTY ([id number]) [id number] Page 7 of 8 Plan Follow-up Appointments: Return Appointment in 1 week. Bathing/ Shower/ Hygiene: May shower; gently cleanse wound with antibacterial soap, rinse and pat dry prior to dressing wounds Anesthetic (Use 'Patient Medications' Section for Anesthetic Order Entry): Lidocaine applied to wound bed WOUND #2: - Foot Wound Laterality: Plantar, Right Cleanser: Soap and Water Every Other Day/30 Days Discharge Instructions: Gently cleanse wound with antibacterial soap, rinse and pat dry prior to dressing wounds Prim Dressing: Hydrofera Blue Ready Transfer Foam, 2.5x2.5 (in/in) (Dispense As Written) Every Other Day/30 Days ary Discharge Instructions: Apply Hydrofera Blue Ready to wound bed as directed Secondary Dressing: (BORDER) Zetuvit Plus SILICONE BORDER Dressing 4x4 (in/in) (DME) (Dispense As Written) Every Other Day/30 Days Discharge Instructions: Please do not put silicone bordered dressings under wraps. Use non-bordered dressing only. 1. I would recommend that we have the  patient continue to monitor for any signs of infection or worsening. Overall I think that the [facility] is doing a good job with regard to the wound and let it continue as such with that. 2. Recommend continue or rather initiate a bordered foam dressing to get a little bit more padding and catch the drainage we will see how this does. We will see patient back for reevaluation in 1 week here in the clinic. If anything worsens or changes patient will contact our office for additional recommendations. He will continue with a front offloading shoe. Electronic Signature(s) Signed: 05/13/2023 2:31:07 PM By: Allen Derry PA-C Entered By: Allen Derry on 05/13/2023 14:31:07 -------------------------------------------------------------------------------- SuperBill Details Patient Name: Date of Service: [place] Breeding, JO HN M. 05/13/2023 Medical Record Number: [id number] Patient Account Number: [id number] Date of Birth/Sex: Treating RN: September 04, 1960 (62 y.o. 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 I73.89 Other specified peripheral vascular diseases N18.4 Chronic kidney disease, stage 4 (severe) I10 Essential (primary) hypertension Facility Procedures : CPT4 Code: [id number] Description: 11042 - DEB SUBQ TISSUE 20 SQ CM/< ICD-10 Diagnosis Description L97.512 Non-pressure chronic ulcer of other part of right foot with fat layer exposed Modifier: Quantity: 1 Physician Procedures : CPT4 Code Description Modifier [id number] 11042 - WC PHYS SUBQ TISS 20 SQ CM ICD-10 Diagnosis Description ITAY, AVELLINO ([id number]) [id number] Pag [phone]  Non-pressure chronic ulcer of other part of right foot with fat  layer exposed Quantity: 1 e 8 of 8 Electronic Signature(s) Signed: 05/13/2023 2:31:29 PM By: Allen Derry PA-C Entered By: Allen Derry on 05/13/2023 14:31:29  PA-C Entered By: Allen Derry on 05/13/2023 14:30:14 -------------------------------------------------------------------------------- Physical Exam Details Patient Name: Date of Service: [name] M. 05/13/2023 10:45 A M Medical Record Number: [id number] Patient Account Number: [id number] Date of Birth/Sex: Treating RN: September 27, 1960 (62 y.o. [name] Primary Care Provider: [name] Other Clinician: Betha Loa Referring Provider: Treating Provider/Extender: [name] in Treatment: 10 Constitutional Well-nourished and well-hydrated in no acute distress. Respiratory normal breathing without difficulty. Psychiatric this patient is able to make decisions and demonstrates good insight into disease process. Alert and Oriented x 3. pleasant and cooperative. Notes Upon inspection patient's wound bed actually showed signs of good granulation and epithelization at this point. Fortunately I do not see any signs of overall the patient's wound worsening we Argun to perform some debridement clearway necrotic debris and callus around the edges of the wound he tolerated that today without complication and postdebridement the wound bed actually appears to be doing much better which is great news. Electronic Signature(s) Signed: 05/13/2023 2:30:38 PM By: Allen Derry PA-C Entered By: Allen Derry on 05/13/2023 14:30:38 -------------------------------------------------------------------------------- Physician Orders Details Patient Name: Date of Service: Harold Lovely HN M. 05/13/2023 10:45 A M Medical Record Number: [id number] Patient Account Number: [id number] Date of Birth/Sex: Treating RN: [birth year] (62 y.o. 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 Bathing/ Shower/ Hygiene May shower; gently cleanse wound with antibacterial soap, rinse and pat dry prior to dressing wounds Anesthetic (Use 'Patient Medications' Section for Anesthetic Order Entry) Lidocaine applied to wound bed Wound Treatment Wound #2 - Foot Wound Laterality: Plantar, Right Cleanser: Soap and Water Every Other Day/30 Days Discharge Instructions: Gently cleanse wound with antibacterial soap, rinse and pat dry prior to dressing wounds Prim Dressing: Hydrofera Blue Ready Transfer Foam, 2.5x2.5 (in/in) (Dispense As Written) Every Other Day/30 Days ary Discharge Instructions: Apply Hydrofera Blue Ready to wound bed as directed Secondary Dressing: (BORDER) Zetuvit Plus SILICONE BORDER Dressing 4x4 (in/in) (DME) (Dispense As Written) Every Other Day/30 Days Discharge Instructions: Please do not put silicone bordered dressings under wraps. Use non-bordered dressing only. Electronic Signature(s) Signed: 05/13/2023 5:26:09 PM By: Betha Loa Signed: 05/13/2023 6:10:41 PM By: Allen Derry PA-C Entered By: Betha Loa on 05/13/2023 11:27:02 -------------------------------------------------------------------------------- Problem List Details Patient Name: Date of Service: Harold Lovely HN M. 05/13/2023 10:45 A M Medical Record Number: [id number] Patient Account Number: [id number] Date of Birth/Sex: Treating  RN: August 16, 1960 (62 y.o. [name] Primary Care Provider: [name] Other Clinician: Betha Loa Referring Provider: Treating Provider/Extender: [name] in Treatment: 10 Active Problems ICD-10 Encounter Code Description Active Date MDM Diagnosis E11.621 Type 2 diabetes mellitus with foot ulcer 03/04/2023 No Yes JACQUEL, FAGO ([id number]) [id number] Page 5 of 8 [id number] Non-pressure chronic ulcer of other part of right foot with fat layer exposed 03/04/2023 No Yes I73.89 Other specified peripheral vascular diseases 03/04/2023 No Yes N18.4 Chronic kidney disease, stage

## 2023-05-13 NOTE — Progress Notes (Signed)
TIWAN, MOSCHELLA Robbins ([id number]) [id number] Page 1 of 8 Visit Report for 05/13/2023 Arrival Information Details Patient Name: Date of Service: [name] M. 05/13/2023 10:45 A M Medical Record Number: [id number] Patient Account Number: [id number] Date of Birth/Sex: Treating RN: Nov 30, 1960 (62 y.o. [name] Primary Care Elby Blackwelder: [name] Other Clinician: Betha Loa Referring Alyssha Housh: Treating Orlena Garmon/Extender: [name] in Treatment: 10 Visit Information History Since Last Visit All ordered tests and consults were completed: No Patient Arrived: Ambulatory Added or deleted any medications: No Arrival Time: 11:03 Any new allergies or adverse reactions: No Transfer Assistance: None Had a fall or experienced change in No Patient Identification Verified: Yes activities of daily living that may affect Secondary Verification Process Completed: Yes risk of falls: Patient Requires Transmission-Based Precautions: No Signs or symptoms of abuse/neglect since last visito No Patient Has Alerts: Yes Hospitalized since last visit: No Patient Alerts: Diabetic Type 2 Implantable device outside of the clinic excluding No cellular tissue based products placed in the center since last visit: Has Dressing in Place as Prescribed: Yes Has Footwear/Offloading in Place as Prescribed: Yes Right: Wedge Shoe Pain Present Now: No Electronic Signature(s) Unsigned Entered ByBetha Loa on 05/13/2023 11:07:49 -------------------------------------------------------------------------------- Clinic Level of Care Assessment Details Patient Name: Date of Service: Harold Mould M. 05/13/2023 10:45 A M Medical Record Number: [id number] Patient Account Number: [id number] Date of Birth/Sex: Treating RN: 02-Nov-1960 (62 y.o. [name] Primary Care Corrinna Karapetyan: [name] Other Clinician: Betha Loa Referring Makayah Pauli: Treating Shalana Jardin/Extender: [name] in Treatment: 10 Clinic Level of Care Assessment Items TOOL 1 Quantity Score []  - 0 Use when EandM and Procedure is performed on INITIAL visit XZAYVION, FLAIM ([id number]) [id number] Page 2 of 8 ASSESSMENTS - Nursing Assessment / Reassessment []  - 0 General Physical Exam (combine w/ comprehensive assessment (listed just below) when performed on new pt. evals) []  - 0 Comprehensive Assessment (HX, ROS, Risk Assessments, Wounds Hx, etc.) ASSESSMENTS - Wound and Skin Assessment / Reassessment []  - 0 Dermatologic / Skin Assessment (not related to wound area) ASSESSMENTS - Ostomy and/or Continence Assessment and Care []  - 0 Incontinence Assessment and Management []  - 0 Ostomy Care Assessment and Management (repouching, etc.) PROCESS - Coordination of Care []  - 0 Simple Patient / Family Education for ongoing care []  - 0 Complex (extensive) Patient / Family Education for ongoing care []  - 0 Staff obtains Chiropractor, Records, T Results / Process Orders est []  - 0 Staff telephones HHA, Nursing Homes / Clarify orders / etc []  - 0 Routine Transfer to another Facility (non-emergent condition) []  - 0 Routine Hospital Admission (non-emergent condition) []  - 0 New Admissions / Manufacturing engineer / Ordering NPWT Apligraf, etc. , []  - 0 Emergency Hospital Admission (emergent condition) PROCESS - Special Needs []  - 0 Pediatric / Minor Patient Management []  - 0 Isolation Patient Management []  - 0 Hearing / Language / Visual special needs []  - 0 Assessment of Community assistance (transportation, D/C planning, etc.) []  - 0 Additional assistance / Altered mentation []  - 0 Support Surface(s) Assessment (bed, cushion, seat, etc.) INTERVENTIONS - Miscellaneous []  - 0 External ear exam []  - 0 Patient Transfer (multiple staff / Nurse, adult / Similar  devices) []  - 0 Simple Staple / Suture removal (25 or less) []  - 0 Complex Staple / Suture removal (26 or more) []  - 0 Hypo/Hyperglycemic Management (do not check if billed separately) []  - 0 Ankle / Brachial  of 30% by week 4 Date Initiated: 03/04/2023 Date Inactivated: 04/10/2023 Target Resolution Date: 04/04/2023 Goal Status: Unmet Unmet Reason: comorbidties Ulcer/skin breakdown will have a volume reduction of 50% by week 8 Date Initiated: 03/04/2023 Date Inactivated: 05/06/2023 Target Resolution Date: 05/04/2023 Goal Status: Unmet Unmet Reason: comorbidities Ulcer/skin breakdown will have a volume reduction of 80% by week 12 Date Initiated: 03/04/2023 Target Resolution Date: 06/04/2023 Goal Status: Active Ulcer/skin breakdown will heal within 14 weeks Date Initiated: 03/04/2023 Target Resolution Date: 06/18/2023 Goal Status: Active Interventions: Assess patient/caregiver ability to obtain necessary supplies Assess patient/caregiver ability to perform ulcer/skin care regimen upon admission and as needed Assess ulceration(s)  every visit Provide education on ulcer and skin care Treatment Activities: Referred to DME Izadora Roehr for dressing supplies : 03/04/2023 Skin care regimen initiated : 03/04/2023 Notes: Electronic Signature(s) Unsigned Entered By: Betha Loa on 05/13/2023 11:33:49 -------------------------------------------------------------------------------- Pain Assessment Details Patient Name: Date of Service: Harold Mould M. 05/13/2023 10:45 A M Medical Record Number: [id number] Patient Account Number: [id number] Date of Birth/Sex: Treating RN: 08-23-1960 (62 y.o. [name] Primary Care Maxima Skelton: [name] Other Clinician: Betha Loa Referring Felder Lebeda: Treating Dawn Kiper/Extender: [name] in Treatment: 10 Active Problems Location of Pain Severity and Description of Pain Patient Has Paino No Site Locations [name], [name] ([id number]) [id number] Page 6 of 8 Pain Management and Medication Current Pain Management: Electronic Signature(s) Unsigned Entered By: Betha Loa on 05/13/2023 11:15:12 -------------------------------------------------------------------------------- Patient/Caregiver Education Details Patient Name: Date of Service: Harold Mould M. 10/29/2024andnbsp10:45 A M Medical Record Number: [id number] Patient Account Number: [id number] Date of Birth/Gender: Treating RN: [birth year] (62 y.o. [name] Primary Care Physician: [name] Other Clinician: Betha Loa Referring Physician: Treating Physician/Extender: [name] in Treatment: 10 Education Assessment Education Provided To: Patient Education Topics Provided Wound/Skin Impairment: Handouts: Other: continue wound care as directed Methods: Explain/Verbal Responses: State content correctly Electronic Signature(s) Unsigned Entered By: Betha Loa on 05/13/2023  11:34:11 Signature(s): [name] ([id number]) Date(s): [phone].pdf Page 7 of 8 -------------------------------------------------------------------------------- Wound Assessment Details Patient Name: Date of Service: Harold Mould M. 05/13/2023 10:45 A M Medical Record Number: [id number] Patient Account Number: [id number] Date of Birth/Sex: Treating RN: [birth year] (63 y.o. [name] Primary Care Tashawn Greff: [name] Other Clinician: Betha Loa Referring Jemina Scahill: Treating Jovahn Breit/Extender: [name] in Treatment: 10 Wound Status Wound Number: 2 Primary Etiology: Diabetic Wound/Ulcer of the Lower Extremity Wound Location: Right, Plantar Foot Wound Status: Open Wounding Event: Pressure Injury Comorbid History: Type II Diabetes Date Acquired: 01/02/2023 Weeks Of Treatment: 10 Clustered Wound: No Photos Wound Measurements Length: (cm) 0.3 Width: (cm) 0.2 Depth: (cm) 0.1 Area: (cm) 0.047 Volume: (cm) 0.005 % Reduction in Area: -51.6% % Reduction in Volume: 16.7% Epithelialization: Large (67-100%) Wound Description Classification: Grade 1 Exudate Amount: Medium Exudate Type: Serosanguineous Exudate Color: red, brown Foul Odor After Cleansing: No Slough/Fibrino No Wound Bed Granulation Amount: Large (67-100%) Exposed Structure Granulation Quality: Pink Fascia Exposed: No Necrotic Amount: None Present (0%) Fat Layer (Subcutaneous Tissue) Exposed: Yes Tendon Exposed: No Muscle Exposed: No Joint Exposed: No Bone Exposed: No Treatment Notes Wound #2 (Foot) Wound Laterality: Plantar, Right Cleanser Soap and Water Discharge Instruction: Gently cleanse wound with antibacterial soap, rinse and pat dry prior to dressing wounds Peri-Wound Care [name], [name] ([id number]) [id number] Page 8 of 8 Topical Primary Dressing Hydrofera Blue Ready Transfer Foam,  2.5x2.5 (in/in) Discharge Instruction: Apply Hydrofera Blue Ready to wound bed  as directed Secondary Dressing (BORDER) Zetuvit Plus SILICONE BORDER Dressing 4x4 (in/in) Discharge Instruction: Please do not put silicone bordered dressings under wraps. Use non-bordered dressing only. Secured With Compression Wrap Compression Stockings Add-Ons Electronic Signature(s) Unsigned Entered By: Betha Loa on 05/13/2023 11:16:09 -------------------------------------------------------------------------------- Vitals Details Patient Name: Date of Service: Harold Mould M. 05/13/2023 10:45 A M Medical Record Number: [id number] Patient Account Number: [id number] Date of Birth/Sex: Treating RN: 1960/09/13 (62 y.o. [name] Primary Care Olina Melfi: [name] Other Clinician: Betha Loa Referring Jalicia Roszak: Treating Shaneisha Burkel/Extender: [name] in Treatment: 10 Vital Signs Time Taken: 11:08 Temperature (F): 97.6 Height (in): 68 Pulse (bpm): 60 Weight (lbs): 209 Respiratory Rate (breaths/min): 18 Body Mass Index (BMI): 31.8 Blood Pressure (mmHg): 126/68 Reference Range: 80 - 120 mg / dl Electronic Signature(s) Unsigned Entered ByBetha Loa on 05/13/2023 11:11:44 Signature(s): Date(s):  as directed Secondary Dressing (BORDER) Zetuvit Plus SILICONE BORDER Dressing 4x4 (in/in) Discharge Instruction: Please do not put silicone bordered dressings under wraps. Use non-bordered dressing only. Secured With Compression Wrap Compression Stockings Add-Ons Electronic Signature(s) Unsigned Entered By: Betha Loa on 05/13/2023 11:16:09 -------------------------------------------------------------------------------- Vitals Details Patient Name: Date of Service: Harold Mould M. 05/13/2023 10:45 A M Medical Record Number: [id number] Patient Account Number: [id number] Date of Birth/Sex: Treating RN: 1960/09/13 (62 y.o. [name] Primary Care Olina Melfi: [name] Other Clinician: Betha Loa Referring Jalicia Roszak: Treating Shaneisha Burkel/Extender: [name] in Treatment: 10 Vital Signs Time Taken: 11:08 Temperature (F): 97.6 Height (in): 68 Pulse (bpm): 60 Weight (lbs): 209 Respiratory Rate (breaths/min): 18 Body Mass Index (BMI): 31.8 Blood Pressure (mmHg): 126/68 Reference Range: 80 - 120 mg / dl Electronic Signature(s) Unsigned Entered ByBetha Loa on 05/13/2023 11:11:44 Signature(s): Date(s):

## 2023-05-14 ENCOUNTER — Ambulatory Visit: Payer: Medicaid Other | Attending: Family | Admitting: Family

## 2023-05-14 ENCOUNTER — Encounter: Payer: Self-pay | Admitting: Family

## 2023-05-14 VITALS — BP 157/78 | HR 63 | Wt 201.0 lb

## 2023-05-14 DIAGNOSIS — F1010 Alcohol abuse, uncomplicated: Secondary | ICD-10-CM | POA: Diagnosis not present

## 2023-05-14 DIAGNOSIS — Z955 Presence of coronary angioplasty implant and graft: Secondary | ICD-10-CM | POA: Insufficient documentation

## 2023-05-14 DIAGNOSIS — I10 Essential (primary) hypertension: Secondary | ICD-10-CM | POA: Diagnosis not present

## 2023-05-14 DIAGNOSIS — E1142 Type 2 diabetes mellitus with diabetic polyneuropathy: Secondary | ICD-10-CM | POA: Diagnosis not present

## 2023-05-14 DIAGNOSIS — I255 Ischemic cardiomyopathy: Secondary | ICD-10-CM | POA: Insufficient documentation

## 2023-05-14 DIAGNOSIS — G4733 Obstructive sleep apnea (adult) (pediatric): Secondary | ICD-10-CM | POA: Diagnosis not present

## 2023-05-14 DIAGNOSIS — N184 Chronic kidney disease, stage 4 (severe): Secondary | ICD-10-CM | POA: Diagnosis not present

## 2023-05-14 DIAGNOSIS — I5022 Chronic systolic (congestive) heart failure: Secondary | ICD-10-CM | POA: Diagnosis not present

## 2023-05-14 DIAGNOSIS — Z794 Long term (current) use of insulin: Secondary | ICD-10-CM | POA: Diagnosis not present

## 2023-05-14 DIAGNOSIS — J449 Chronic obstructive pulmonary disease, unspecified: Secondary | ICD-10-CM | POA: Insufficient documentation

## 2023-05-14 DIAGNOSIS — I2510 Atherosclerotic heart disease of native coronary artery without angina pectoris: Secondary | ICD-10-CM | POA: Insufficient documentation

## 2023-05-14 DIAGNOSIS — E1122 Type 2 diabetes mellitus with diabetic chronic kidney disease: Secondary | ICD-10-CM | POA: Diagnosis not present

## 2023-05-14 DIAGNOSIS — I130 Hypertensive heart and chronic kidney disease with heart failure and stage 1 through stage 4 chronic kidney disease, or unspecified chronic kidney disease: Secondary | ICD-10-CM | POA: Insufficient documentation

## 2023-05-14 DIAGNOSIS — E1151 Type 2 diabetes mellitus with diabetic peripheral angiopathy without gangrene: Secondary | ICD-10-CM | POA: Diagnosis not present

## 2023-05-14 DIAGNOSIS — E785 Hyperlipidemia, unspecified: Secondary | ICD-10-CM | POA: Diagnosis not present

## 2023-05-14 MED ORDER — CARVEDILOL 6.25 MG PO TABS: 6.2500 mg | ORAL_TABLET | Freq: Two times a day (BID) | ORAL | 3 refills | Status: DC

## 2023-05-14 NOTE — Progress Notes (Signed)
[facility] CLIN[facility] NOTE  Primary Care: Smitty Cords, DO (last seen 08/24) Primary Cardiologist: Lorine Bears, MD (last seen 08/24) HF provider: Arvilla Meres, MD (last seen 06/24)   HPI:  [name] is a 62 y.o. male with history of hyperlipidemia, CAD w/ PCI and DES 10/2016, chronic systolic HF, DM2, HTN, CKD IV, ETOH abuse and PAD.   He is s/p drug-eluting stent placement to the LAD 4/818 in setting of acute HF.  At that time, he was found to have severe LV dysfunction with an EF 25-30% which was felt to be out of proportion to the LAD disease.  He also has a history of alcohol abuse. F/u echo 2018 EF 40-45%.    Admitted 02/19/23 due to acute on chronic heart failure. Cardiology and nephrology consults done. IV diuresing held. Antibiotic given for foot infection.   Echo 08/28/14: EF 40-45% with Grade I DD and mild MR Echo 08/16/20: EF 45-50%.  There was concern about possible cardiac amyloidosis.  PYP scan was equivocal for amyloidosis  Echo 08/08/22 EF 55-60%  Echo 02/19/23: EF 50-55% with mild LVH, Grade II DD and mild/ moderate MR  LHC 10/21/16:  Ost 2nd Diag to 2nd Diag lesion, 60 %stenosed. Mid Cx lesion, 50 %stenosed. Prox RCA lesion, 40 %stenosed. Mid RCA lesion, 20 %stenosed. A STENT XIENCE ALPINE RX 3.5X15 drug eluting stent was successfully placed. Mid LAD lesion, 90 %stenosed. Post intervention, there is a 0% residual stenosis.  1. Severe one-vessel coronary artery disease involving mid LAD. 2. Severely reduced LV systolic function by echo. Left ventricular angiography was not performed. LVEDP was only mildly elevated at 15 mmHg. 3. Successful angioplasty and drug-eluting stent placement to the mid LAD.  PFTs 2/24: FEV1 2.44 (71%) FVC 3.62 (79%) Ratio 67% DLCO 60% c/w mild obstruction  He presents today for a HF f/u visit with a chief complaint of minmal fatigue with moderate exertion. Chronic in nature. Has no other symptoms and specifically denies  shortness of breath, chest pain, cough, palpitations, abdominal distention, pedal edema, dizziness, weight gain or difficulty sleeping.   He has not taken his medications yet this morning as he hasn't eaten any breakfast yet. No carvedilol X1 week because he ran out of refills.   Saw nephrology earlier this week and had lab work drawn.   ROS: All systems negative except as listed in HPI, PMH and Problem List.  SH:  Social History   Socioeconomic History   Marital status: Married    Spouse name: Not on file   Number of children: Not on file   Years of education: Not on file   Highest education level: Not on file  Occupational History   Not on file  Tobacco Use   Smoking status: Never   Smokeless tobacco: Never  Vaping Use   Vaping status: Never Used  Substance and Sexual Activity   Alcohol use: Yes    Comment: occassionally   Drug use: No   Sexual activity: Not Currently  Other Topics Concern   Not on file  Social History Narrative   Not on file   Social Determinants of Health   Financial Resource Strain: Not on file  Food Insecurity: No Food Insecurity (02/20/2023)   Hunger Vital Sign    Worried About Running Out of Food in the Last Year: Never true    Ran Out of Food in the Last Year: Never true  Transportation Needs: No Transportation Needs (02/20/2023)   PRAPARE -  Administrator, Civil Service (Medical): No    Lack of Transportation (Non-Medical): No  Physical Activity: Not on file  Stress: Not on file  Social Connections: Not on file  Intimate Partner Violence: Not At Risk (02/20/2023)   Humiliation, Afraid, Rape, and Kick questionnaire    Fear of Current or Ex-Partner: No    Emotionally Abused: No    Physically Abused: No    Sexually Abused: No    FH:  Family History  Problem Relation Age of Onset   Hypertension Mother    Heart disease Maternal Grandfather     Past Medical History:  Diagnosis Date   Ankle pain    Chronic combined systolic  (congestive) and diastolic (congestive) heart failure (HCC) 10/2016   a. 10/2016 Echo: EF 25-30%, diff HK; b. 01/2017 Echo:  EF 40-45%, GrI DD; c. 08/2017 Echo: EF 40-45%, GrI DD; d. 08/2020 Echo: EF 45-50%, glob HK. Mod asymm LVH, GrI DD, Nl RV size/fxn; e. 08/2020 PYP: equivocal for ATTR cardiac amyloid.   CKD (chronic kidney disease), stage III - IV (HCC)    Coronary artery disease    a. 10/2016 Cath/PCI: LAD 65m (3.5x15 Xience Alpine DES). No other obstructive disease.   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 10/2016 Echo: EF 25-30%, diff HK; b. 01/2017 Echo:  EF 40-45%, GrI DD; c. 08/2017 Echo: EF 40-45%, GrI DD; d. 08/2020 Echo: EF 45-50%, glob HK. Mod asymm LVH, GrI DD, Nl RV size/fxn.   Pain in both feet     Current Outpatient Medications  Medication Sig Dispense Refill   ACCU-CHEK FASTCLIX LANCETS MISC Check sugar up to 3 x daily as instructed 102 each 12   ACCU-CHEK GUIDE test strip Check blood sugar up to 3 times daily as advised 100 each 12   acetaminophen (TYLENOL 8 HOUR) 650 MG CR tablet Take 1 tablet (650 mg total) by mouth every 8 (eight) hours as needed for pain.     aspirin EC 81 MG tablet Take 81 mg by mouth daily.     atorvastatin (LIPITOR) 40 MG tablet TAKE 1 TABLET BY MOUTH EVERY DAY 90 tablet 1   carvedilol (COREG) 6.25 MG tablet Take 1 tablet (6.25 mg total) by mouth 2 (two) times daily with a meal. 60 tablet 1   doxazosin (CARDURA) 1 MG tablet TAKE 2 TABLETS (2 MG TOTAL) BY MOUTH AT BEDTIME. (Patient taking differently: Take 1 mg by mouth at bedtime.) 180 tablet 1   furosemide (LASIX) 40 MG tablet Take 1 tablet (40 mg total) by mouth daily. 30 tablet 1   gabapentin (NEURONTIN) 400 MG capsule Take 1 capsule (400 mg total) by mouth 2 (two) times daily. 360 capsule 1   GVOKE HYPOPEN 2-PACK 1 MG/0.2ML SOAJ Inject 1 mg into the skin as needed (hypoglycemia). 1 mL 2   hydrALAZINE (APRESOLINE) 100  MG tablet Take 1 tablet (100 mg total) by mouth 2 (two) times daily. 60 tablet 3   Insulin Pen Needle (B-D ULTRAFINE III SHORT PEN) 31G X 8 MM MISC USE TO INJECT INSULIN NIGHTLY 100 each 6   isosorbide mononitrate (IMDUR) 60 MG 24 hr tablet TAKE 1 TABLET BY MOUTH 2 TIMES DAILY. 180 tablet 1   Lancets Misc. MISC Use  Brand compatable to insurance and monitor to check blood sugar up to 3 times daily. ICD10 E11.9 100 each 12  LANTUS SOLOSTAR 100 UNIT/ML Solostar Pen INJECT 40 UNITS INTO THE SKIN AT BEDTIME. 45 mL 1   valsartan (DIOVAN) 80 MG tablet Take 1 tablet (80 mg total) by mouth daily. 30 tablet 3   No current facility-administered medications for this visit.   Vitals:   05/14/23 1243 05/14/23 1244  BP: (!) 169/87 (!) 157/78  Pulse: 63   SpO2: 99%   Weight: 201 lb (91.2 kg)    Wt Readings from Last 3 Encounters:  05/14/23 201 lb (91.2 kg)  04/28/23 208 lb (94.3 kg)  04/09/23 204 lb (92.5 kg)   Lab Results  Component Value Date   CREATININE 3.32 (H) 04/09/2023   CREATININE 3.73 (H) 02/23/2023   CREATININE 3.83 (H) 02/22/2023   PHYSICAL EXAM:  General:  Well appearing. No resp difficulty HEENT: normal Neck: supple. JVP flat. No lymphadenopathy or thryomegaly appreciated. Cor: PMI normal. Regular rhythm & rate. No rubs, gallops or murmurs. Lungs: clear Abdomen: soft, nontender, nondistended. No hepatosplenomegaly. No bruits or masses.  Extremities: no cyanosis, clubbing, rash, trace pedal edema bilaterally Neuro: alert & oriented x3, cranial nerves grossly intact. Moves all 4 extremities w/o difficulty. Affect pleasant.   ECG: 02/20/23 showed SB with LBBB   ASSESSMENT & PLAN:  1.  Ischemic cardiomyopathy with preserved ejection fraction-  - status post LAD stenting in April 2018 - Stable NYHA II - euvolemic - not weighing daily; reminded to call for overnight weight gain of > 2 pounds or a weekly weight gain of > 5 pounds - weight down 3 pounds from last visit 1 month  ago - Echo 08/28/14: EF 40-45% with Grade I DD and mild MR - Echo 08/16/20: EF 45-50%.  There was concern about possible cardiac amyloidosis.   - Echo 08/08/22 EF 55-60%  - Echo 02/19/23: EF 50-55% with mild LVH, Grade II DD and mild/ moderate MR - PYP 2/22 reviewed. H/CL 1.17 Negative for amyloid - continue valsartan 80mg  daily - continue lasix 40mg  daily - continue hydralazine 100 bid (regularly forgets noon dose) and imdur 60 daily - resume carvedilol 6.25mg  BID (refill provided today) - not on Entresto, farxiga or spironolactone due to CKD (eGFR 16-20) - Jardiance was associated with generalized rash.  - BNP 02/19/23 was 1502.1  2.  Coronary artery disease-  - Status post LAD stenting in April 2018.  - saw cardiology Kirke Corin) 08/24 - not candidate for repeat cath with CKD IV - continue ASA 81mg  daily - continue atorvastatin 40mg  daily - LDL 01/30/23 was 71 - LHC 10/21/16:  Ost 2nd Diag to 2nd Diag lesion, 60 %stenosed. Mid Cx lesion, 50 %stenosed. Prox RCA lesion, 40 %stenosed. Mid RCA lesion, 20 %stenosed. A STENT XIENCE ALPINE RX 3.5X15 drug eluting stent was successfully placed. Mid LAD lesion, 90 %stenosed. Post intervention, there is a 0% residual stenosis.  1. Severe one-vessel coronary artery disease involving mid LAD. 2. Severely reduced LV systolic function by echo. Left ventricular angiography was not performed. LVEDP was only mildly elevated at 15 mmHg. 3. Successful angioplasty and drug-eluting stent placement to the mid LAD.   3.  Essential hypertension-  - BP 157/78 but he hasn't taken his meds yet today and has been out of carvedilol for ~ 1 week due to needing refills - saw PCP Althea Charon) 08/24 - continue cardura but decrease this to 1mg  daily; if BP remains stable, plan to stop it at next visit - BMP 05/12/23 showed sodium 139, potassium 5.4, creatinine 3.07 & GFR 22  4.  DM2- - per PCP - A1c 01/30/23 was 8.6% - has decreased his insulin to 20 units because he was  getting episodes of hypoglycemia   5.  Stage IV chronic kidney disease- - CKD IV baseline Scr 3.1-3.3 - current eGFR too low for farxiga - saw nephrology Cherylann Ratel) 10/24 & had lab work drawn  6. COPD- - PFTs 2/24: FEV1 2.44 (71%) FVC 3.62 (79%) Ratio 67% DLCO 60% c/w mild obstruction - had upcoming pulmonology appt cancelled due to conflict  7. OSA- - sleep study done 01/20/23 and showed severe sleep apnea with REI of 46.5/ hour - patient says today that he has no desire to wear CPAP equipment - continue to discuss    Return in 3 months, sooner if needed.

## 2023-05-14 NOTE — Patient Instructions (Signed)
Good to see you today! I refilled your carvedilol for you.    If you receive a satisfaction survey regarding the [facility], please take the time to fill it out. This way we can continue to provide excellent care and make any changes that need to be made.

## 2023-05-20 ENCOUNTER — Encounter: Payer: Medicaid Other | Attending: Physician Assistant | Admitting: Physician Assistant

## 2023-05-20 DIAGNOSIS — E11621 Type 2 diabetes mellitus with foot ulcer: Secondary | ICD-10-CM | POA: Insufficient documentation

## 2023-05-20 DIAGNOSIS — E1151 Type 2 diabetes mellitus with diabetic peripheral angiopathy without gangrene: Secondary | ICD-10-CM | POA: Insufficient documentation

## 2023-05-20 DIAGNOSIS — E1122 Type 2 diabetes mellitus with diabetic chronic kidney disease: Secondary | ICD-10-CM | POA: Diagnosis not present

## 2023-05-20 DIAGNOSIS — N184 Chronic kidney disease, stage 4 (severe): Secondary | ICD-10-CM | POA: Diagnosis not present

## 2023-05-20 DIAGNOSIS — L97512 Non-pressure chronic ulcer of other part of right foot with fat layer exposed: Secondary | ICD-10-CM | POA: Insufficient documentation

## 2023-05-20 DIAGNOSIS — I129 Hypertensive chronic kidney disease with stage 1 through stage 4 chronic kidney disease, or unspecified chronic kidney disease: Secondary | ICD-10-CM | POA: Insufficient documentation

## 2023-05-20 NOTE — Progress Notes (Signed)
STACI, CARVER ([id number]) [id number] Page 1 of 8 Visit Report for 05/20/2023 Chief Complaint Document Details Patient Name: Date of Service: [name]. 05/20/2023 12:45 PM Medical Record Number: [id number] Patient Account Number: [id number] Date of Birth/Sex: Treating RN: 08/22/1960 (62 y.o. [name] Primary Care Provider: [name] Other Clinician: Betha [name] Referring Provider: Treating Provider/Extender: [name] in Treatment: 11 Information Obtained from: Patient Chief Complaint Right plantar foot ulcer Electronic Signature(s) Signed: 05/20/2023 1:14:07 PM By: Allen Derry PA-C Entered By: Allen Derry on 05/20/2023 13:14:07 -------------------------------------------------------------------------------- Debridement Details Patient Name: Date of Service: Harold Lovely HN M. 05/20/2023 12:45 PM Medical Record Number: [id number] Patient Account Number: [id number] Date of Birth/Sex: Treating RN: [birth year] (62 y.o. [name] Primary Care Provider: [name] Other Clinician: Betha [name] Referring Provider: Treating Provider/Extender: [name] in Treatment: 11 Debridement Performed for Assessment: Wound #2 Right,Plantar Foot Performed By: Physician Allen Derry, PA-C Debridement Type: Debridement Severity of Tissue Pre Debridement: Fat layer exposed Level of Consciousness (Pre-procedure): Awake and Alert Pre-procedure Verification/Time Out Yes - 13:21 Taken: Start Time: 13:21 Percent of Wound Bed Debrided: 100% T Area Debrided (cm): otal 0.02 Tissue and other material debrided: Viable, Non-Viable, Callus, Slough, Subcutaneous, Slough Level: Skin/Subcutaneous Tissue Debridement Description: Excisional Instrument: Curette Bleeding: Minimum Hemostasis Achieved: Pressure Response to Treatment: Procedure was tolerated well Level  of Consciousness (Post- Awake and Alert procedure): DECKLIN, WEDD[place] ([id number]) [id number] Page 2 of 8 Post Debridement Measurements of Total Wound Length: (cm) 0.2 Width: (cm) 0.1 Depth: (cm) 0.1 Volume: (cm) 0.002 Character of Wound/Ulcer Post Debridement: Stable Severity of Tissue Post Debridement: Fat layer exposed Post Procedure Diagnosis Same as Pre-procedure Electronic Signature(s) Signed: 05/20/2023 3:24:56 PM By: Allen Derry PA-C Signed: 05/20/2023 5:02:23 PM By: Midge Aver MSN RN CNS WTA Entered By: [name] on 05/20/2023 13:22:51 -------------------------------------------------------------------------------- HPI Details Patient Name: Date of Service: [name], [name] 05/20/2023 12:45 PM Medical Record Number: [id number] Patient Account Number: [id number] Date of Birth/Sex: Treating RN: 1961-04-28 (62 y.o. [name] Primary Care Provider: [name] Other Clinician: Betha [name] Referring Provider: Treating Provider/Extender: [name] in Treatment: 11 History of Present Illness Chronic/Inactive Conditions Condition 1: 03-04-2023 patient's arterial screening today actually appears to show some signs of being somewhat low at 0.83 on the left 1.53 on the right which is essentially noncompressible. I really do not think we can assure ourselves of these readings and I think he really should see vascular for a thorough arterial evaluation in order to ensure that he has good blood flow although I suspect he probably does. HPI Description: 07/20/17 on evaluation today patient actually appears to be doing somewhat poorly in regard to his right plantar great toe ulcer. This is something that he states started as what he thought to be a blister May 04, 2018. Subsequently he treated this on his own for a while thinking that it would just get better. As it did not get better he eventually did  go to have this evaluated where it was treated appropriately with it sounds like Bactroban ointment however the patient states that it never completely resolved. Subsequently if this continue to get worse and was looking more irritated and inflamed he did go to the hospital more recently where he was prescribed Keflex. Subsequently he was also referred to the wound care center here for further evaluation by [place]. He had an x-ray in the ER which revealed  no evidence of osteomyelitis that I explained to the patient that osteomyelitis can be present even in a negative x- ray as it's not nearly as sensitive as an MRI would be. No fevers, chills, nausea, or vomiting noted at this time. The patient tells me that this might look a little bit better since he started the antibiotics. He does have a history of diabetes mellitus type II he also had a horse accident years ago and had to have significant surgery in regard to his right lower extremity that did leave some deformity in this regard. This is mainly centered in the shin region. Readmission: 03-04-2023 upon evaluation today patient appears to be doing somewhat poorly today in regard to the wound on the right plantar foot. 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 With that being said the patient tells me at this point that he has been treating this just at home using some Betadine on the area but really nothing else has been done up to this point. This is actually his first office for his care is concerned in a clinic setting. Patient's past medical history actually is very similar to previous when I saw him. He does have a history of diabetes mellitus type 2, peripheral vascular disease, chronic kidney disease stage IV, hypertension, he has not had any formal arterial testing up to this point. Patient is  likely going require an x-ray today. 8/27; patient was seen for the first time last week he has a wound over the metatarsal head on the right foot. 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 Allena Katz of podiatry. 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 He has been using Hydrofera Blue and sit to vet to the wound area. He had an x-ray done that showed faint sclerosis of the head of the second metatarsal which was nonspecific but new from 07/16/2018 which could be posttraumatic secondary to avascular necrosis or chronic infection 04-29-2023 upon evaluation today patient appears to be doing well currently in regard to his wound. Unfortunately he did not tolerate the total contact cast very well at all at this point. He tells me that he has been having some issues here specifically with the cast causing some irritation to his leg due to the fact that he is had a significant break in the leg. The BX. Fortunately I do not see any signs of active infection locally or systemically at this time. 05-06-2023 patient's wound on the distal portion of his foot actually is showing signs of improvement. In particular I think the right plantar foot again is overall smaller than what it was at the last visit when I saw him 1 week ago this is great news. [name], [name] ([id number]) [id number] Page 3 of 8 05-13-2023 upon evaluation today patient appears to be doing well currently in regard to his foot ulcer. This is not doing any worse than what it was previous. With that being said fortunately I do not see any signs of active infection at this time. No fevers, chills, nausea, vomiting, or diarrhea. 05-20-2023 upon evaluation today patient appears to be doing well currently in regard to his wound. He has been tolerating the dressing changes without complication. Unfortunately he is having a [name] time with a front  offloading  shoe due to the rod in his leg although he does tell me that he is trying to stay off of this is much as possible and when he does have to be up he is padded as well as he can. Electronic Signature(s) Signed: 05/20/2023 1:29:00 PM By: Allen Derry PA-C Entered By: Allen Derry on 05/20/2023 13:29:00 -------------------------------------------------------------------------------- Physical Exam Details Patient Name: Date of Service: Harold Mould M. 05/20/2023 12:45 PM Medical Record Number: [id number] Patient Account Number: [id number] Date of Birth/Sex: Treating RN: 08-Sep-1960 (62 y.o. [name] Primary Care Provider: [name] Other Clinician: Betha [name] Referring Provider: Treating Provider/Extender: [name] in Treatment: 11 Constitutional Well-nourished and well-hydrated in no acute distress. Respiratory normal breathing without difficulty. Psychiatric this patient is able to make decisions and demonstrates good insight into disease process. Alert and Oriented x 3. pleasant and cooperative. Notes Upon inspection patient's wound bed actually showed signs of good granulation and epithelization at this point. Fortunately there does not appear to be any signs of active infection at this time which is good news. I did have to perform some debridement remove callus slough and subcu down to good subcutaneous tissue and the patient tolerated this today without complication. Electronic Signature(s) Signed: 05/20/2023 1:29:26 PM By: Allen Derry PA-C Entered By: Allen Derry on 05/20/2023 13:29:26 -------------------------------------------------------------------------------- Physician Orders Details Patient Name: Date of Service: Harold Mould M. 05/20/2023 12:45 PM Medical Record Number: [id number] Patient Account Number: [id number] Date of Birth/Sex: Treating RN: [birth year] (62 y.o. [name] Primary Care Provider:  [name] Other Clinician: Betha [name] Referring Provider: Treating Provider/Extender: [name] in Treatment: 11 The following information was scribed by: [name], [name] ([id number]) [id number] Page 4 of 8 The information was scribed for: Allen Derry Verbal / Phone Orders: No Diagnosis Coding ICD-10 Coding Code Description E11.621 Type 2 diabetes mellitus with foot ulcer L97.512 Non-pressure chronic ulcer of other part of right foot with fat layer exposed I73.89 Other specified peripheral vascular diseases N18.4 Chronic kidney disease, stage 4 (severe) I10 Essential (primary) hypertension Follow-up Appointments Return Appointment in 1 week. Bathing/ Shower/ Hygiene May shower; gently cleanse wound with antibacterial soap, rinse and pat dry prior to dressing wounds Anesthetic (Use 'Patient Medications' Section for Anesthetic Order Entry) Lidocaine applied to wound bed Wound Treatment Wound #2 - Foot Wound Laterality: Plantar, Right Cleanser: Soap and Water Every Other Day/30 Days Discharge Instructions: Gently cleanse wound with antibacterial soap, rinse and pat dry prior to dressing wounds Prim Dressing: Hydrofera Blue Ready Transfer Foam, 2.5x2.5 (in/in) (Dispense As Written) Every Other Day/30 Days ary Discharge Instructions: Apply Hydrofera Blue Ready to wound bed as directed Secondary Dressing: (BORDER) Zetuvit Plus SILICONE BORDER Dressing 4x4 (in/in) (Dispense As Written) Every Other Day/30 Days Discharge Instructions: Please do not put silicone bordered dressings under wraps. Use non-bordered dressing only. Electronic Signature(s) Signed: 05/20/2023 3:24:56 PM By: Allen Derry PA-C Entered By: [name] on 05/20/2023 13:23:20 -------------------------------------------------------------------------------- Problem List Details Patient Name: Date of Service: Harold Mould  M. 05/20/2023 12:45 PM Medical Record Number: [id number] Patient Account Number: [id number] Date of Birth/Sex: Treating RN: [birth year] (62 y.o. [name] Primary Care Provider: [name] Other Clinician: Betha [name] Referring Provider: Treating Provider/Extender: [name] in Treatment: 11 Active Problems ICD-10 Encounter Code Description Active Date MDM Diagnosis E11.621 Type 2 diabetes mellitus with foot ulcer 03/04/2023 No Yes L97.512 Non-pressure chronic ulcer of other  part of right foot with fat layer exposed 03/04/2023 No Yes [name], [name] ([id number]) [id number] Page 5 of 8 I73.89 Other specified peripheral vascular diseases 03/04/2023 No Yes N18.4 Chronic kidney disease, stage 4 (severe) 03/04/2023 No Yes I10 Essential (primary) hypertension 03/04/2023 No Yes Inactive Problems Resolved Problems Electronic Signature(s) Signed: 05/20/2023 1:14:02 PM By: Allen Derry PA-C Entered By: Allen Derry on 05/20/2023 13:14:01 -------------------------------------------------------------------------------- Progress Note Details Patient Name: Date of Service: Harold Lovely HN M. 05/20/2023 12:45 PM Medical Record Number: [id number] Patient Account Number: [id number] Date of Birth/Sex: Treating RN: 1961-07-10 (62 y.o. [name] Primary Care Provider: [name] Other Clinician: Betha [name] Referring Provider: Treating Provider/Extender: [name] in Treatment: 11 Subjective Chief Complaint Information obtained from Patient Right plantar foot ulcer History of Present Illness (HPI) Chronic/Inactive Condition: 03-04-2023 patient's arterial screening today actually appears to show some signs of being somewhat low at 0.83 on the left 1.53 on the right which is essentially noncompressible. I really do not think we can assure ourselves of these readings and I  think he really should see vascular for a thorough arterial evaluation in order to ensure that he has good blood flow although I suspect he probably does. 07/20/17 on evaluation today patient actually appears to be doing somewhat poorly in regard to his right plantar great toe ulcer. This is something that he states started as what he thought to be a blister May 04, 2018. Subsequently he treated this on his own for a while thinking that it would just get better. As it did not get better he eventually did go to have this evaluated where it was treated appropriately with it sounds like Bactroban ointment however the patient states that it never completely resolved. Subsequently if this continue to get worse and was looking more irritated and inflamed he did go to the hospital more recently where he was prescribed Keflex. Subsequently he was also referred to the wound care center here for further evaluation by [place]. He had an x-ray in the ER which revealed no evidence of osteomyelitis that I explained to the patient that osteomyelitis can be present even in a negative x-ray as it's not nearly as sensitive as an MRI would be. No fevers, chills, nausea, or vomiting noted at this time. The patient tells me that this might look a little bit better since he started the antibiotics. He does have a history of diabetes mellitus type II he also had a horse accident years ago and had to have significant surgery in regard to his right lower extremity that did leave some deformity in this regard. This is mainly centered in the shin region. Readmission: 03-04-2023 upon evaluation today patient appears to be doing somewhat poorly today in regard to the wound on the right plantar foot. 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 With that being said the patient tells me at  this point that he has been treating this just at home using some Betadine on the area but really nothing else has been done up to this point. This is actually his first office for his care is concerned in a clinic setting. Patient's past medical history actually is very similar to previous when I saw him. He does have a history of diabetes mellitus type 2, peripheral vascular disease, chronic kidney disease  stage IV, hypertension, he has not had any formal arterial testing up to this point. Patient is likely going require an x-ray today. [name], [name] ([id number]) [id number] Page 6 of 8 8/27; patient was seen for the first time last week he has a wound over the metatarsal head on the right foot. 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 Allena Katz of podiatry. 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 He has been using Hydrofera Blue and sit to vet to the wound area. He had an x-ray done that showed faint sclerosis of the head of the second metatarsal which was nonspecific but new from 07/16/2018 which could be posttraumatic secondary to avascular necrosis or chronic infection 04-29-2023 upon evaluation today patient appears to be doing well currently in regard to his wound. Unfortunately he did not tolerate the total contact cast very well at all at this point. He tells me that he has been having some issues here specifically with the cast causing some irritation to his leg due to the fact that he is had a significant break in the leg. The BX. Fortunately I do not see any signs of active infection locally or systemically at this time. 05-06-2023 patient's wound on the distal portion of his foot actually is showing signs of improvement. In particular I think the right plantar foot again is overall smaller than what it was at the last visit when I saw him 1 week ago this is great news. 05-13-2023 upon  evaluation today patient appears to be doing well currently in regard to his foot ulcer. This is not doing any worse than what it was previous. With that being said fortunately I do not see any signs of active infection at this time. No fevers, chills, nausea, vomiting, or diarrhea. 05-20-2023 upon evaluation today patient appears to be doing well currently in regard to his wound. He has been tolerating the dressing changes without complication. Unfortunately he is having a [name] time with a front offloading shoe due to the rod in his leg although he does tell me that he is trying to stay off of this is much as possible and when he does have to be up he is padded as well as he can. Objective Constitutional Well-nourished and well-hydrated in no acute distress. Vitals Time Taken: 12:59 PM, Height: 68 in, Weight: 209 lbs, BMI: 31.8, Temperature: 98.2 F, Pulse: 86 bpm, Respiratory Rate: 18 breaths/min, Blood Pressure: 170/92 mmHg. Respiratory normal breathing without difficulty. Psychiatric this patient is able to make decisions and demonstrates good insight into disease process. Alert and Oriented x 3. pleasant and cooperative. General Notes: Upon inspection patient's wound bed actually showed signs of good granulation and epithelization at this point. Fortunately there does not appear to be any signs of active infection at this time which is good news. I did have to perform some debridement remove callus slough and subcu down to good subcutaneous tissue and the patient tolerated this today without complication. Integumentary (Hair, Skin) Wound #2 status is Open. Original cause of wound was Pressure Injury. The date acquired was: 01/02/2023. The wound has been in treatment 11 weeks. The wound is located on the Right,Plantar Foot. The wound measures 0.2cm length x 0.1cm width x 0.1cm depth; 0.016cm^2 area and 0.002cm^3 volume. There is Fat Layer (Subcutaneous Tissue) exposed. There is a medium amount  of serosanguineous drainage noted. There is large (67-100%) pink granulation within the  wound bed. There is no necrotic tissue within the wound bed. Assessment Active Problems ICD-10 Type 2 diabetes mellitus with foot ulcer Non-pressure chronic ulcer of other part of right foot with fat layer exposed Other specified peripheral vascular diseases Chronic kidney disease, stage 4 (severe) Essential (primary) hypertension Procedures Wound #2 Pre-procedure diagnosis of Wound #2 is a Diabetic Wound/Ulcer of the Lower Extremity located on the Right,Plantar Foot .Severity of Tissue Pre Debridement is: Fat layer exposed. There was a Excisional Skin/Subcutaneous Tissue Debridement with a total area of 0.02 sq cm performed by Allen Derry, PA-C. With the following instrument(s): Curette to remove Viable and Non-Viable tissue/material. Material removed includes Callus, Subcutaneous Tissue, and Slough. A time out was conducted at 13:21, prior to the start of the procedure. A Minimum amount of bleeding was controlled with Pressure. The procedure was tolerated well. Post Debridement Measurements: 0.2cm length x 0.1cm width x 0.1cm depth; 0.002cm^3 volume. Character of Wound/Ulcer Post Debridement is stable. Severity of Tissue Post Debridement is: Fat layer exposed. Post procedure Diagnosis Wound #2: Same as Pre-Procedure [name], [name] ([id number]) [id number] Page 7 of 8 Plan Follow-up Appointments: Return Appointment in 1 week. Bathing/ Shower/ Hygiene: May shower; gently cleanse wound with antibacterial soap, rinse and pat dry prior to dressing wounds Anesthetic (Use 'Patient Medications' Section for Anesthetic Order Entry): Lidocaine applied to wound bed WOUND #2: - Foot Wound Laterality: Plantar, Right Cleanser: Soap and Water Every Other Day/30 Days Discharge Instructions: Gently cleanse wound with antibacterial soap, rinse and pat dry prior to dressing wounds Prim  Dressing: Hydrofera Blue Ready Transfer Foam, 2.5x2.5 (in/in) (Dispense As Written) Every Other Day/30 Days ary Discharge Instructions: Apply Hydrofera Blue Ready to wound bed as directed Secondary Dressing: (BORDER) Zetuvit Plus SILICONE BORDER Dressing 4x4 (in/in) (Dispense As Written) Every Other Day/30 Days Discharge Instructions: Please do not put silicone bordered dressings under wraps. Use non-bordered dressing only. 1. I would recommend currently based on what we are seeing that we have the patient going to continue to monitor for any signs of infection or worsening. I do believe that he is doing well. 2. I am good recommend we continue with Hydrofera Blue with the dressing in place I think this seems to be doing quite well. 3. I am also can recommend that we have the patient continue with the bordered foam dressing to cover which is helping with some additional padding. We will see patient back for reevaluation in 1 week here in the clinic. If anything worsens or changes patient will contact our office for additional recommendations. He tells me he will try to use the front offloading shoe is much as possible although it is very difficult due to the rod in his leg that causes a lot of discomfort. Electronic Signature(s) Signed: 05/20/2023 1:30:32 PM By: Allen Derry PA-C Entered By: Allen Derry on 05/20/2023 13:30:32 -------------------------------------------------------------------------------- SuperBill Details Patient Name: Date of Service: [name], [name] 05/20/2023 Medical Record Number: [id number] Patient Account Number: [id number] Date of Birth/Sex: Treating RN: 09/24/1960 (62 y.o. [name] Primary Care Provider: [name] Other Clinician: Betha [name] Referring Provider: Treating Provider/Extender: [name] in Treatment: 11 Diagnosis Coding ICD-10 Codes Code Description [id number] Type 2 diabetes mellitus with foot  ulcer L97.512 Non-pressure chronic ulcer of other part of right foot with fat layer exposed I73.89 Other specified peripheral vascular diseases N18.4 Chronic kidney disease, stage 4 (severe) I10 Essential (primary) hypertension Facility Procedures Physician Procedures : CPT4 Code Description Modifier [phone]  11042 - WC PHYS SUBQ TISS 20 SQ CM ICD-10 Diagnosis Description L97.512 Non-pressure chronic ulcer of other part of right foot with fat layer exposed Quantity: 1 Electronic Signature(s) Signed: 05/20/2023 1:35:07 PM By: Allen Derry PA-C Entered By: Allen Derry on 05/20/2023 13:35:06

## 2023-05-22 NOTE — Progress Notes (Addendum)
SHADD, LEGNON ([id number]) [id number] Page 1 of 7 Visit Report for 05/20/2023 Arrival Information Details Patient Name: Date of Service: [name] [facility] M. 05/20/2023 12:45 PM Medical Record Number: [id number] Patient Account Number: [id number] Date of Birth/Sex: Treating RN: [birth year] (62 y.o. [name] Primary Care Kamillah Didonato: [name] Other Clinician: Betha Loa Referring Danalee Flath: Treating Nyree Applegate/Extender: [name] in Treatment: 11 Visit Information History Since Last Visit All ordered tests and consults were completed: No Patient Arrived: Ambulatory Added or deleted any medications: No Arrival Time: 12:53 Any new allergies or adverse reactions: No Transfer Assistance: None Had a fall or experienced change in No Patient Identification Verified: Yes activities of daily living that may affect Secondary Verification Process Completed: Yes risk of falls: Patient Requires Transmission-Based Precautions: No Signs or symptoms of abuse/neglect since last visito No Patient Has Alerts: Yes Hospitalized since last visit: No Patient Alerts: Diabetic Type 2 Implantable device outside of the clinic excluding No cellular tissue based products placed in the center since last visit: Has Dressing in Place as Prescribed: Yes Pain Present Now: No Electronic Signature(s) Signed: 05/21/2023 5:39:21 PM By: Betha Loa Entered By: Betha Loa on 05/20/2023 09:56:11 -------------------------------------------------------------------------------- Clinic Level of Care Assessment Details Patient Name: Date of Service: Harold Mould M. 05/20/2023 12:45 PM Medical Record Number: [id number] Patient Account Number: [id number] Date of Birth/Sex: Treating RN: 1960/08/12 (62 y.o. [name] Primary Care Crystallee Werden: [name] Other Clinician: Betha Loa Referring Talynn Lebon: Treating  Cordarius Benning/Extender: [name] in Treatment: 11 Clinic Level of Care Assessment Items TOOL 1 Quantity Score []  - 0 Use when EandM and Procedure is performed on INITIAL visit ASSESSMENTS - Nursing Assessment / Reassessment []  - 0 General Physical Exam (combine w/ comprehensive assessment (listed just below) when performed on new pt. evals) []  - 0 Comprehensive Assessment (HX, ROS, Risk Assessments, Wounds Hx, etc.) [name], [name] ([id number]) [id number] Page 2 of 7 ASSESSMENTS - Wound and Skin Assessment / Reassessment []  - 0 Dermatologic / Skin Assessment (not related to wound area) ASSESSMENTS - Ostomy and/or Continence Assessment and Care []  - 0 Incontinence Assessment and Management []  - 0 Ostomy Care Assessment and Management (repouching, etc.) PROCESS - Coordination of Care []  - 0 Simple Patient / Family Education for ongoing care []  - 0 Complex (extensive) Patient / Family Education for ongoing care []  - 0 Staff obtains Chiropractor, Records, T Results / Process Orders est []  - 0 Staff telephones HHA, Nursing Homes / Clarify orders / etc []  - 0 Routine Transfer to another Facility (non-emergent condition) []  - 0 Routine Hospital Admission (non-emergent condition) []  - 0 New Admissions / Manufacturing engineer / Ordering NPWT Apligraf, etc. , []  - 0 Emergency Hospital Admission (emergent condition) PROCESS - Special Needs []  - 0 Pediatric / Minor Patient Management []  - 0 Isolation Patient Management []  - 0 Hearing / Language / Visual special needs []  - 0 Assessment of Community assistance (transportation, D/C planning, etc.) []  - 0 Additional assistance / Altered mentation []  - 0 Support Surface(s) Assessment (bed, cushion, seat, etc.) INTERVENTIONS - Miscellaneous []  - 0 External ear exam []  - 0 Patient Transfer (multiple staff / Nurse, adult / Similar devices) []  - 0 Simple Staple / Suture  removal (25 or less) []  - 0 Complex Staple / Suture removal (26 or more) []  - 0 Hypo/Hyperglycemic Management (do not check if billed separately) []  - 0 Ankle / Brachial Index (ABI) - do not check  if billed separately Has the patient been seen at the hospital within the last three years: Yes Total Score: 0 Level Of Care: ____ Electronic Signature(s) Signed: 05/21/2023 5:39:21 PM By: Betha Loa Entered By: Betha Loa on 05/20/2023 10:23:36 -------------------------------------------------------------------------------- Encounter Discharge Information Details Patient Name: Date of Service: [name] [facility] M. 05/20/2023 12:45 PM Medical Record Number: [id number] Patient Account Number: [id number] Date of Birth/Sex: Treating RN: February 10, 1961 (62 y.o. [name] Primary Care Mechelle Pates: [name] Other Clinician: Betha Loa Referring Duquan Gillooly: Treating Finnigan Warriner/Extender: Wandell, Fetterman ([id number]) [id number] Page 3 of 7 Weeks in Treatment: 11 Encounter Discharge Information Items Post Procedure Vitals Discharge Condition: Stable Temperature (F): 98.2 Ambulatory Status: Ambulatory Pulse (bpm): 86 Discharge Destination: Home Respiratory Rate (breaths/min): 18 Transportation: Private Auto Blood Pressure (mmHg): 170/92 Accompanied By: self Schedule Follow-up Appointment: Yes Clinical Summary of Care: Electronic Signature(s) Signed: 05/21/2023 5:39:21 PM By: Betha Loa Entered By: Betha Loa on 05/20/2023 10:25:29 -------------------------------------------------------------------------------- Lower Extremity Assessment Details Patient Name: Date of Service: Harold Mould M. 05/20/2023 12:45 PM Medical Record Number: [id number] Patient Account Number: [id number] Date of Birth/Sex: Treating RN: [birth year] (61 y.o. [name] Primary Care Elektra Wartman: [name] Other  Clinician: Betha Loa Referring Selenne Coggin: Treating Aminta Sakurai/Extender: [name] in Treatment: 11 Electronic Signature(s) Signed: 05/20/2023 5:02:23 PM By: Midge Aver MSN RN CNS WTA Signed: 05/21/2023 5:39:21 PM By: Betha Loa Entered By: Betha Loa on 05/20/2023 10:08:49 -------------------------------------------------------------------------------- Multi Wound Chart Details Patient Name: Date of Service: Harold Mould M. 05/20/2023 12:45 PM Medical Record Number: [id number] Patient Account Number: [id number] Date of Birth/Sex: Treating RN: Jun 06, 1961 (62 y.o. [name] Primary Care Keerstin Bjelland: [name] Other Clinician: Betha Loa Referring Bayani Renteria: Treating Myles Tavella/Extender: [name] in Treatment: 11 Vital Signs Height(in): 68 Pulse(bpm): 86 Weight(lbs): 209 Blood Pressure(mmHg): 170/92 Body Mass Index(BMI): 31.8 Temperature(F): 98.2 Respiratory Rate(breaths/min): [address] M ([id number]):Photos:] [phone] Page 4 of 7:2 N/A N/A N/A N/A] Right, Plantar Foot N/A N/A Wound Location: Pressure Injury N/A N/A Wounding Event: Diabetic Wound/Ulcer of the Lower N/A N/A Primary Etiology: Extremity Type II Diabetes N/A N/A Comorbid History: 01/02/2023 N/A N/A Date Acquired: 11 N/A N/A Weeks of Treatment: Open N/A N/A Wound Status: No N/A N/A Wound Recurrence: 0.2x0.1x0.1 N/A N/A Measurements L x W x D (cm) 0.016 N/A N/A A (cm) : rea 0.002 N/A N/A Volume (cm) : 48.40% N/A N/A % Reduction in A rea: 66.70% N/A N/A % Reduction in Volume: Grade 1 N/A N/A Classification: Medium N/A N/A Exudate A mount: Serosanguineous N/A N/A Exudate Type: red, brown N/A N/A Exudate Color: Large (67-100%) N/A N/A Granulation A mount: Pink N/A N/A Granulation Quality: None Present (0%) N/A N/A Necrotic A mount: Fat Layer (Subcutaneous  Tissue): Yes N/A N/A Exposed Structures: Fascia: No Tendon: No Muscle: No Joint: No Bone: No Large (67-100%) N/A N/A Epithelialization: Treatment Notes Electronic Signature(s) Signed: 05/21/2023 5:39:21 PM By: Betha Loa Entered By: Betha Loa on 05/20/2023 10:08:54 -------------------------------------------------------------------------------- Multi-Disciplinary Care Plan Details Patient Name: Date of Service: [name] HN M. 05/20/2023 12:45 PM Medical Record Number: [id number] Patient Account Number: [id number] Date of Birth/Sex: Treating RN: April 25, 1961 (62 y.o. [name] Primary Care Etienne Millward: [name] Other Clinician: Betha Loa Referring Mesha Schamberger: Treating Auguste Tebbetts/Extender: [name] in Treatment: 11 Active Inactive Electronic Signature(s) Signed: 07/03/2023 9:10:50 AM By: Elliot Gurney, BSN, RN, CWS, Kim RN, BSN Signed: 07/11/2023 12:17:45 PM By: Midge Aver MSN  RN CNS WTA Previous Signature: 07/03/2023 9:09:36 AM Version By: Elliot Gurney, BSN, RN, CWS, Kim RN, BSN Previous Signature: 05/20/2023 5:02:23 PM Version By: Midge Aver MSN RN CNS [address], Margit Banda ([id number]) [id number] Page 5 of 7 Previous Signature: 05/20/2023 5:02:23 PM Version By: Midge Aver MSN RN CNS WTA Previous Signature: 05/21/2023 5:39:21 PM Version By: Betha Loa Entered By: Elliot Gurney BSN, RN, CWS, Kim on 07/03/2023 06:10:50 -------------------------------------------------------------------------------- Pain Assessment Details Patient Name: Date of Service: [name] HN M. 05/20/2023 12:45 PM Medical Record Number: [id number] Patient Account Number: [id number] Date of Birth/Sex: Treating RN: Apr 08, 1961 (62 y.o. [name] Primary Care Demarqus Jocson: [name] Other Clinician: Betha Loa Referring Aadarsh Cozort: Treating Jayln Branscom/Extender: [name] in Treatment:  11 Active Problems Location of Pain Severity and Description of Pain Patient Has Paino No Site Locations Pain Management and Medication Current Pain Management: Electronic Signature(s) Signed: 05/20/2023 5:02:23 PM By: Midge Aver MSN RN CNS WTA Signed: 05/21/2023 5:39:21 PM By: Betha Loa Entered By: Betha Loa on 05/20/2023 10:07:48 -------------------------------------------------------------------------------- Patient/Caregiver Education Details Patient Name: Date of Service: Harold Mote. 11/5/2024andnbsp12:45 PM Medical Record Number: [id number] Patient Account Number: [id number] [name], [name] ([id number]) [id number] Page 6 of 7 Date of Birth/Gender: Treating RN: 1960-12-22 (62 y.o. [name] Primary Care Physician: [name] Other Clinician: Betha Loa Referring Physician: Treating Physician/Extender: [name] in Treatment: 11 Education Assessment Education Provided To: Patient Education Topics Provided Wound/Skin Impairment: Handouts: Other: continue wound care as directed Methods: Explain/Verbal Responses: State content correctly Electronic Signature(s) Signed: 05/21/2023 5:39:21 PM By: Betha Loa Entered By: Betha Loa on 05/20/2023 10:24:16 -------------------------------------------------------------------------------- Wound Assessment Details Patient Name: Date of Service: [name] HN M. 05/20/2023 12:45 PM Medical Record Number: [id number] Patient Account Number: [id number] Date of Birth/Sex: Treating RN: 1961/06/17 (62 y.o. [name] Primary Care Hayze Gazda: [name] Other Clinician: Betha Loa Referring Rondell Pardon: Treating Aeryn Medici/Extender: [name] in Treatment: 11 Wound Status Wound Number: 2 Primary Etiology: Diabetic Wound/Ulcer of the Lower Extremity Wound Location: Right, Plantar  Foot Wound Status: Open Wounding Event: Pressure Injury Comorbid History: Type II Diabetes Date Acquired: 01/02/2023 Weeks Of Treatment: 11 Clustered Wound: No Photos Wound Measurements Length: (cm) 0.2 Width: (cm) 0.1 Depth: (cm) 0.1 Area: (cm) 0.016 Volume: (cm) 0.002 [name], [name] ([id number]) Wound Description Classification: Grade 1 Exudate Amount: Medium Exudate Type: Serosanguineous Exudate Color: red, brown Foul Odor After Cleansing: No Slough/Fibrino No % Reduction in Area: 48.4% % Reduction in Volume: 66.7% Epithelialization: Large (67-100%) [phone].pdf Page 7 of 7 Wound Bed Granulation Amount: Large (67-100%) Exposed Structure Granulation Quality: Pink Fascia Exposed: No Necrotic Amount: None Present (0%) Fat Layer (Subcutaneous Tissue) Exposed: Yes Tendon Exposed: No Muscle Exposed: No Joint Exposed: No Bone Exposed: No Electronic Signature(s) Signed: 05/20/2023 5:02:23 PM By: Midge Aver MSN RN CNS WTA Signed: 05/21/2023 5:39:21 PM By: Betha Loa Entered By: Betha Loa on 05/20/2023 10:08:42 -------------------------------------------------------------------------------- Vitals Details Patient Name: Date of Service: [name], [name] 05/20/2023 12:45 PM Medical Record Number: [id number] Patient Account Number: [id number] Date of Birth/Sex: Treating RN: December 24, 1960 (62 y.o. [name] Primary Care Carmell Elgin: [name] Other Clinician: Betha Loa Referring Beaumont Austad: Treating Nyeshia Mysliwiec/Extender: [name] in Treatment: 11 Vital Signs Time Taken: 12:59 Temperature (F): 98.2 Height (in): 68 Pulse (bpm): 86 Weight (lbs): 209 Respiratory Rate (breaths/min): 18 Body Mass Index (BMI): 31.8 Blood Pressure (mmHg): 170/92 Reference Range: 80 - 120 mg /  dl Electronic Signature(s) Signed: 05/21/2023 5:39:21 PM By: Betha Loa Entered By: Betha Loa on 05/20/2023  10:07:43

## 2023-05-27 ENCOUNTER — Other Ambulatory Visit: Payer: Self-pay | Admitting: Family

## 2023-05-28 ENCOUNTER — Ambulatory Visit: Payer: Medicaid Other | Admitting: Physician Assistant

## 2023-06-02 ENCOUNTER — Other Ambulatory Visit: Payer: Self-pay | Admitting: Family Medicine

## 2023-06-02 DIAGNOSIS — E1142 Type 2 diabetes mellitus with diabetic polyneuropathy: Secondary | ICD-10-CM

## 2023-06-03 NOTE — Telephone Encounter (Signed)
Requested medication (s) are due for refill today:   Yes  Requested medication (s) are on the active medication list:   Yes  Future visit scheduled:   No   Last ordered: 02/05/2023 45 ml, 1 refill  Clarification needed on how he is to take this.   As ordered or as pt. Takes it.      Requested Prescriptions  Pending Prescriptions Disp Refills   LANTUS SOLOSTAR 100 UNIT/ML Solostar Pen [Pharmacy Med Name: LANTUS SOLOSTAR 100 UNIT/ML]  1    Sig: INJECT 40 UNITS INTO THE SKIN AT BEDTIME.     Endocrinology:  Diabetes - Insulins Failed - 06/02/2023  3:06 PM      Failed - HBA1C is between 0 and 7.9 and within 180 days    Hemoglobin A1C  Date Value Ref Range Status  04/01/2019 7.7  Final    Comment:    [facility]   Hgb A1c MFr Bld  Date Value Ref Range Status  01/30/2023 8.6 (H) <5.7 % of total Hgb Final    Comment:    For someone without known diabetes, a hemoglobin A1c value of 6.5% or greater indicates that they may have  diabetes and this should be confirmed with a follow-up  test. . For someone with known diabetes, a value <7% indicates  that their diabetes is well controlled and a value  greater than or equal to 7% indicates suboptimal  control. A1c targets should be individualized based on  duration of diabetes, age, comorbid conditions, and  other considerations. . Currently, no consensus exists regarding use of hemoglobin A1c for diagnosis of diabetes for children. Verna Czech - Valid encounter within last 6 months    Recent Outpatient Visits           3 months ago Acute on chronic heart failure with preserved ejection fraction (HFpEF) [facility])   [place] [facility] Althea Charon, Netta Neat, DO   4 months ago Annual physical exam   [place] [facility] Smitty Cords, DO   10 months ago DM type 2 with diabetic peripheral neuropathy [facility])   [place] [facility] Smitty Cords, DO   1 year ago Annual physical exam   [place] [facility] Smitty Cords, DO   1 year ago DM type 2 with diabetic peripheral neuropathy [facility])   [place] [facility], Netta Neat, [place]

## 2023-06-04 ENCOUNTER — Other Ambulatory Visit: Payer: Self-pay | Admitting: Family Medicine

## 2023-06-04 DIAGNOSIS — E1142 Type 2 diabetes mellitus with diabetic polyneuropathy: Secondary | ICD-10-CM

## 2023-06-05 DIAGNOSIS — Z794 Long term (current) use of insulin: Secondary | ICD-10-CM | POA: Diagnosis not present

## 2023-06-05 DIAGNOSIS — Z882 Allergy status to sulfonamides status: Secondary | ICD-10-CM | POA: Diagnosis not present

## 2023-06-05 DIAGNOSIS — R5381 Other malaise: Secondary | ICD-10-CM | POA: Diagnosis not present

## 2023-06-05 DIAGNOSIS — E872 Acidosis, unspecified: Secondary | ICD-10-CM | POA: Diagnosis not present

## 2023-06-05 DIAGNOSIS — N184 Chronic kidney disease, stage 4 (severe): Secondary | ICD-10-CM | POA: Diagnosis not present

## 2023-06-05 DIAGNOSIS — E113299 Type 2 diabetes mellitus with mild nonproliferative diabetic retinopathy without macular edema, unspecified eye: Secondary | ICD-10-CM | POA: Diagnosis not present

## 2023-06-05 DIAGNOSIS — R509 Fever, unspecified: Secondary | ICD-10-CM | POA: Diagnosis not present

## 2023-06-05 DIAGNOSIS — I130 Hypertensive heart and chronic kidney disease with heart failure and stage 1 through stage 4 chronic kidney disease, or unspecified chronic kidney disease: Secondary | ICD-10-CM | POA: Diagnosis not present

## 2023-06-05 DIAGNOSIS — L97419 Non-pressure chronic ulcer of right heel and midfoot with unspecified severity: Secondary | ICD-10-CM | POA: Diagnosis not present

## 2023-06-05 DIAGNOSIS — E11621 Type 2 diabetes mellitus with foot ulcer: Secondary | ICD-10-CM | POA: Diagnosis not present

## 2023-06-05 DIAGNOSIS — N3001 Acute cystitis with hematuria: Secondary | ICD-10-CM | POA: Diagnosis not present

## 2023-06-05 DIAGNOSIS — I2510 Atherosclerotic heart disease of native coronary artery without angina pectoris: Secondary | ICD-10-CM | POA: Diagnosis not present

## 2023-06-05 DIAGNOSIS — E11628 Type 2 diabetes mellitus with other skin complications: Secondary | ICD-10-CM | POA: Diagnosis not present

## 2023-06-05 DIAGNOSIS — R058 Other specified cough: Secondary | ICD-10-CM | POA: Diagnosis not present

## 2023-06-05 DIAGNOSIS — I5022 Chronic systolic (congestive) heart failure: Secondary | ICD-10-CM | POA: Diagnosis not present

## 2023-06-05 DIAGNOSIS — L97519 Non-pressure chronic ulcer of other part of right foot with unspecified severity: Secondary | ICD-10-CM | POA: Diagnosis not present

## 2023-06-05 DIAGNOSIS — R197 Diarrhea, unspecified: Secondary | ICD-10-CM | POA: Diagnosis not present

## 2023-06-05 DIAGNOSIS — L97512 Non-pressure chronic ulcer of other part of right foot with fat layer exposed: Secondary | ICD-10-CM | POA: Diagnosis not present

## 2023-06-05 DIAGNOSIS — Z7985 Long-term (current) use of injectable non-insulin antidiabetic drugs: Secondary | ICD-10-CM | POA: Diagnosis not present

## 2023-06-05 DIAGNOSIS — D631 Anemia in chronic kidney disease: Secondary | ICD-10-CM | POA: Diagnosis not present

## 2023-06-05 DIAGNOSIS — E1140 Type 2 diabetes mellitus with diabetic neuropathy, unspecified: Secondary | ICD-10-CM | POA: Diagnosis not present

## 2023-06-05 DIAGNOSIS — N179 Acute kidney failure, unspecified: Secondary | ICD-10-CM | POA: Diagnosis not present

## 2023-06-05 DIAGNOSIS — E11622 Type 2 diabetes mellitus with other skin ulcer: Secondary | ICD-10-CM | POA: Diagnosis not present

## 2023-06-05 DIAGNOSIS — E1122 Type 2 diabetes mellitus with diabetic chronic kidney disease: Secondary | ICD-10-CM | POA: Diagnosis not present

## 2023-06-05 DIAGNOSIS — L03115 Cellulitis of right lower limb: Secondary | ICD-10-CM | POA: Diagnosis not present

## 2023-06-05 DIAGNOSIS — E1151 Type 2 diabetes mellitus with diabetic peripheral angiopathy without gangrene: Secondary | ICD-10-CM | POA: Diagnosis not present

## 2023-06-05 DIAGNOSIS — Z7982 Long term (current) use of aspirin: Secondary | ICD-10-CM | POA: Diagnosis not present

## 2023-06-05 DIAGNOSIS — E860 Dehydration: Secondary | ICD-10-CM | POA: Diagnosis not present

## 2023-06-05 DIAGNOSIS — R0789 Other chest pain: Secondary | ICD-10-CM | POA: Diagnosis not present

## 2023-06-05 DIAGNOSIS — E1121 Type 2 diabetes mellitus with diabetic nephropathy: Secondary | ICD-10-CM | POA: Diagnosis not present

## 2023-06-05 DIAGNOSIS — K5900 Constipation, unspecified: Secondary | ICD-10-CM | POA: Diagnosis not present

## 2023-06-05 NOTE — Telephone Encounter (Signed)
Would you be able to contact the pharmacy to confirm the situation with the insurance coverage and find more info?  The notes say "Not covered under insurance"  However, he has Medicaid Healthy Blue and Lantus brand name or generic are both preferred.  So, something seems incorrect here. Maybe we have incorrect insurance info or maybe pharmacy has incorrect info?  Also, if it is truly not preferred - I would ask the pharmacy for a list of alternative preferred basal insulin products to choose from.  Thank you!

## 2023-06-05 NOTE — Telephone Encounter (Signed)
Requested medications are due for refill today.  no  Requested medications are on the active medications list.  yes  Last refill. 06/03/2023 45mL 1 rf  Future visit scheduled.   yes  Notes to clinic.    Pharmacy comment: Alternative Requested:NOT COVERED UNDER INSURANCE.     Requested Prescriptions  Pending Prescriptions Disp Refills   LANTUS SOLOSTAR 100 UNIT/ML Solostar Pen [Pharmacy Med Name: LANTUS SOLOSTAR 100 UNIT/ML]  1    Sig: INJECT 40 UNITS INTO THE SKIN AT BEDTIME.     Endocrinology:  Diabetes - Insulins Failed - 06/04/2023  9:51 AM      Failed - HBA1C is between 0 and 7.9 and within 180 days    Hemoglobin A1C  Date Value Ref Range Status  04/01/2019 7.7  Final    Comment:    [facility]   Hgb A1c MFr Bld  Date Value Ref Range Status  01/30/2023 8.6 (H) <5.7 % of total Hgb Final    Comment:    For someone without known diabetes, a hemoglobin A1c value of 6.5% or greater indicates that they may have  diabetes and this should be confirmed with a follow-up  test. . For someone with known diabetes, a value <7% indicates  that their diabetes is well controlled and a value  greater than or equal to 7% indicates suboptimal  control. A1c targets should be individualized based on  duration of diabetes, age, comorbid conditions, and  other considerations. . Currently, no consensus exists regarding use of hemoglobin A1c for diagnosis of diabetes for children. Verna Czech - Valid encounter within last 6 months    Recent Outpatient Visits           3 months ago Acute on chronic heart failure with preserved ejection fraction (HFpEF) [facility])   [place] [facility] Althea Charon, Netta Neat, DO   4 months ago Annual physical exam   [place] [facility] Smitty Cords, DO   10 months ago DM type 2 with diabetic peripheral neuropathy [facility])   [place] [facility] Smitty Cords,  DO   1 year ago Annual physical exam   [facility] Smitty Cords, DO   1 year ago DM type 2 with diabetic peripheral neuropathy [facility])   [place] [facility] Althea Charon, Netta Neat, DO       Future Appointments             In 6 days Althea Charon, Netta Neat, DO [place] [facility], [facility]

## 2023-06-06 DIAGNOSIS — R808 Other proteinuria: Secondary | ICD-10-CM | POA: Diagnosis not present

## 2023-06-06 DIAGNOSIS — L97519 Non-pressure chronic ulcer of other part of right foot with unspecified severity: Secondary | ICD-10-CM | POA: Diagnosis not present

## 2023-06-06 DIAGNOSIS — L97419 Non-pressure chronic ulcer of right heel and midfoot with unspecified severity: Secondary | ICD-10-CM | POA: Diagnosis not present

## 2023-06-06 DIAGNOSIS — R059 Cough, unspecified: Secondary | ICD-10-CM | POA: Diagnosis not present

## 2023-06-06 DIAGNOSIS — E11621 Type 2 diabetes mellitus with foot ulcer: Secondary | ICD-10-CM | POA: Diagnosis not present

## 2023-06-06 DIAGNOSIS — I129 Hypertensive chronic kidney disease with stage 1 through stage 4 chronic kidney disease, or unspecified chronic kidney disease: Secondary | ICD-10-CM | POA: Diagnosis not present

## 2023-06-06 DIAGNOSIS — R509 Fever, unspecified: Secondary | ICD-10-CM | POA: Diagnosis not present

## 2023-06-06 DIAGNOSIS — R0789 Other chest pain: Secondary | ICD-10-CM | POA: Diagnosis not present

## 2023-06-06 DIAGNOSIS — N3001 Acute cystitis with hematuria: Secondary | ICD-10-CM | POA: Diagnosis not present

## 2023-06-06 DIAGNOSIS — N179 Acute kidney failure, unspecified: Secondary | ICD-10-CM | POA: Diagnosis not present

## 2023-06-06 DIAGNOSIS — L0889 Other specified local infections of the skin and subcutaneous tissue: Secondary | ICD-10-CM | POA: Diagnosis not present

## 2023-06-06 DIAGNOSIS — R058 Other specified cough: Secondary | ICD-10-CM | POA: Diagnosis not present

## 2023-06-06 DIAGNOSIS — N184 Chronic kidney disease, stage 4 (severe): Secondary | ICD-10-CM | POA: Diagnosis not present

## 2023-06-06 DIAGNOSIS — E11628 Type 2 diabetes mellitus with other skin complications: Secondary | ICD-10-CM | POA: Diagnosis not present

## 2023-06-06 DIAGNOSIS — R5381 Other malaise: Secondary | ICD-10-CM | POA: Diagnosis not present

## 2023-06-06 DIAGNOSIS — L97512 Non-pressure chronic ulcer of other part of right foot with fat layer exposed: Secondary | ICD-10-CM | POA: Diagnosis not present

## 2023-06-07 ENCOUNTER — Other Ambulatory Visit: Payer: Self-pay | Admitting: Cardiovascular Disease

## 2023-06-07 DIAGNOSIS — E1122 Type 2 diabetes mellitus with diabetic chronic kidney disease: Secondary | ICD-10-CM | POA: Diagnosis not present

## 2023-06-07 DIAGNOSIS — N184 Chronic kidney disease, stage 4 (severe): Secondary | ICD-10-CM | POA: Diagnosis not present

## 2023-06-07 DIAGNOSIS — L0889 Other specified local infections of the skin and subcutaneous tissue: Secondary | ICD-10-CM | POA: Diagnosis not present

## 2023-06-07 DIAGNOSIS — I1A0 Resistant hypertension: Secondary | ICD-10-CM

## 2023-06-07 DIAGNOSIS — E11628 Type 2 diabetes mellitus with other skin complications: Secondary | ICD-10-CM | POA: Diagnosis not present

## 2023-06-08 DIAGNOSIS — I10 Essential (primary) hypertension: Secondary | ICD-10-CM | POA: Diagnosis not present

## 2023-06-08 DIAGNOSIS — L97419 Non-pressure chronic ulcer of right heel and midfoot with unspecified severity: Secondary | ICD-10-CM | POA: Diagnosis not present

## 2023-06-08 DIAGNOSIS — N049 Nephrotic syndrome with unspecified morphologic changes: Secondary | ICD-10-CM | POA: Diagnosis not present

## 2023-06-08 DIAGNOSIS — N179 Acute kidney failure, unspecified: Secondary | ICD-10-CM | POA: Diagnosis not present

## 2023-06-09 ENCOUNTER — Ambulatory Visit: Payer: Medicaid Other | Admitting: Physician Assistant

## 2023-06-09 DIAGNOSIS — N049 Nephrotic syndrome with unspecified morphologic changes: Secondary | ICD-10-CM | POA: Diagnosis not present

## 2023-06-09 DIAGNOSIS — I10 Essential (primary) hypertension: Secondary | ICD-10-CM | POA: Diagnosis not present

## 2023-06-09 DIAGNOSIS — L97419 Non-pressure chronic ulcer of right heel and midfoot with unspecified severity: Secondary | ICD-10-CM | POA: Diagnosis not present

## 2023-06-09 DIAGNOSIS — L03115 Cellulitis of right lower limb: Secondary | ICD-10-CM | POA: Diagnosis not present

## 2023-06-09 NOTE — Telephone Encounter (Signed)
I have placed referral to Clinical Pharmacist to assist with his med rec and find out why he is missing the Lantus, when pharmacy says he already picked it up.  His A1c is uncontrolled, so our pharmacy team can assist with getting his Diabetes medications organized.  In meantime, can you contact patient and if he is actually out of his Lantus insulin, he can pick up a free sample of a basal long acting insulin from our office.  I believe [name] is the current one we have samples of. 1 pen for now until our pharmacist can call him back to discuss.  [name], DO [facility] 06/09/2023, 7:07 PM

## 2023-06-09 NOTE — Telephone Encounter (Signed)
Please advise on refill request.  There are discrepancies in what is in chart and what is requested.  [name] may have changed dosing as well.

## 2023-06-10 ENCOUNTER — Telehealth: Payer: Self-pay

## 2023-06-10 DIAGNOSIS — L03115 Cellulitis of right lower limb: Secondary | ICD-10-CM | POA: Diagnosis not present

## 2023-06-10 DIAGNOSIS — N189 Chronic kidney disease, unspecified: Secondary | ICD-10-CM | POA: Diagnosis not present

## 2023-06-10 DIAGNOSIS — D631 Anemia in chronic kidney disease: Secondary | ICD-10-CM | POA: Diagnosis not present

## 2023-06-10 DIAGNOSIS — I10 Essential (primary) hypertension: Secondary | ICD-10-CM | POA: Diagnosis not present

## 2023-06-10 NOTE — Progress Notes (Signed)
   Care Guide Note  06/10/2023 Name: [name] MRN: [id number] DOB: 1960/08/07  Referred by: Smitty Cords, DO Reason for referral : Care Coordination (Outreach to schedule with Pharm d )   [name] is a 62 y.o. year old male who is a primary care patient of Smitty Cords, DO. [name] was referred to the pharmacist for assistance related to DM.    Patient has appointment in office 06/11/2023. Patient would like to discuss further with Dr. Pauletta Browns , RMA     [place]  [facility] Guide  Direct Dial: [phone]  Website: [email or web address]

## 2023-06-11 ENCOUNTER — Ambulatory Visit: Payer: Medicaid Other | Admitting: Family Medicine

## 2023-06-11 DIAGNOSIS — L03115 Cellulitis of right lower limb: Secondary | ICD-10-CM | POA: Diagnosis not present

## 2023-06-16 NOTE — Telephone Encounter (Signed)
[name], could you review chart for refill requests?  They were last sent by you and the doses are different than documented in most recent cardiology note.

## 2023-06-17 ENCOUNTER — Ambulatory Visit: Payer: Medicaid Other | Admitting: Family Medicine

## 2023-06-17 ENCOUNTER — Encounter: Payer: Self-pay | Admitting: Family Medicine

## 2023-06-17 VITALS — BP 110/72 | Ht 69.0 in | Wt 192.0 lb

## 2023-06-17 DIAGNOSIS — E1142 Type 2 diabetes mellitus with diabetic polyneuropathy: Secondary | ICD-10-CM

## 2023-06-17 DIAGNOSIS — E11621 Type 2 diabetes mellitus with foot ulcer: Secondary | ICD-10-CM

## 2023-06-17 DIAGNOSIS — L97512 Non-pressure chronic ulcer of other part of right foot with fat layer exposed: Secondary | ICD-10-CM | POA: Diagnosis not present

## 2023-06-17 MED ORDER — GABAPENTIN 100 MG PO CAPS
100.0000 mg | ORAL_CAPSULE | Freq: Two times a day (BID) | ORAL | 1 refills | Status: DC
Start: 2023-06-17 — End: 2024-01-12

## 2023-06-17 NOTE — Progress Notes (Signed)
Subjective:    Patient ID: [name], male    DOB: 03/07/1961, 62 y.o.   MRN: [id number]  [name] is a 62 y.o. male presenting on 06/17/2023 for Hospitalization Follow-up   HPI  Discussed the use of AI scribe software for clinical note transcription with the patient, who gave verbal consent to proceed.     The patient, with a history of kidney disease and a recent hospitalization, presented for a follow-up visit. They were admitted to the hospital on 11/21 due to a right metatarsal ulceration, which was treated with a 10-day course of antibiotics, completed on 11/30. The patient reported feeling better and believed the ulceration was resolving. No procedures were performed on the foot during the hospital stay.  During the hospitalization, there was a concern about the patient's kidney function. However, by the time of discharge, the kidney function had improved significantly. The patient was scheduled for a follow-up blood work to monitor the kidney function.  The patient's medication, Gabapentin, was reduced from 400mg  to 100mg  due to kidney concerns. The patient reported taking the reduced dose without any issues. They were previously taking 400mg  twice a day, but the dose was reduced to 100mg  twice a day. The patient was satisfied with the reduced dosage and reported no issues.  The patient was scheduled for a routine follow-up appointment in the spring or summer. They also had an upcoming appointment with an eye doctor. The patient denied receiving a flu shot.      HOSPITAL FOLLOW-UP VISIT  Hospital/Location: [facility] Date of Admission: 06/05/23 Date of Discharge: 06/11/23 Transitions of care telephone call: not completed  Reason for Admission: Diabetic Ulcer, AoCKD  FOLLOW-UP  - Hospital H&P and Discharge Summary have been reviewed - Patient presents today about 6 days after recent hospitalization. Brief summary of recent course, patient had symptoms of malaise and  chills and cellulitis skin infection of L foot, hospitalized, treated with IV antibiotics, cultures, operative debridement, transitioned to oral antibiotics, and vascular / podiatry and wound care outpatient . - Today reports overall has done well after discharge. Symptoms of illness related to ulcer have improved  - New medications on discharge: Augmentin, Doxycycline - Changes to current meds on discharge: Gabapentin 400 TWICE A DAY down to 100mg  BID  Outpatient arterial Doppler is recommended and follow-up in podiatry clinic at [facility]. Appointment scheduled for 12/3.   I have reviewed the discharge medication list, and have reconciled the current and discharge medications today.       02/28/2023    2:05 PM 07/24/2022    8:38 AM 01/21/2022   12:47 PM  Depression screen PHQ 2/9  Decreased Interest 0 1 0  Down, Depressed, Hopeless 0 0 0  PHQ - 2 Score 0 1 0  Altered sleeping 0 2   Tired, decreased energy 0 2   Change in appetite 0 0   Feeling bad or failure about yourself  0 0   Trouble concentrating 0 0   Moving slowly or fidgety/restless 0 0   Suicidal thoughts 0 0   PHQ-9 Score 0 5   Difficult doing work/chores Not difficult at all Not difficult at all        02/28/2023    2:05 PM 07/24/2022    8:39 AM 01/04/2021    8:11 AM  GAD 7 : Generalized Anxiety Score  Nervous, Anxious, on Edge 0 0 0  Control/stop worrying 0 1 0  Worry too much - different things 0  0 0  Trouble relaxing 0 1 0  Restless 0 0 0  Easily annoyed or irritable 0 0 0  Afraid - awful might happen 0 0 0  Total GAD 7 Score 0 2 0  Anxiety Difficulty Not difficult at all Not difficult at all Not difficult at all    Social History   Tobacco Use   Smoking status: Never   Smokeless tobacco: Never  Vaping Use   Vaping status: Never Used  Substance Use Topics   Alcohol use: Yes    Comment: occassionally   Drug use: No    Review of Systems Per HPI unless specifically indicated above      Objective:    BP 110/72   Ht 5\' 9"  (1.753 m)   Wt 192 lb (87.1 kg)   BMI 28.35 kg/m   Wt Readings from Last 3 Encounters:  06/17/23 192 lb (87.1 kg)  05/14/23 201 lb (91.2 kg)  04/28/23 208 lb (94.3 kg)    Physical Exam Vitals and nursing note reviewed.  Constitutional:      General: He is not in acute distress.    Appearance: Normal appearance. He is well-developed. He is not diaphoretic.     Comments: Well-appearing, comfortable, cooperative  HENT:     Head: Normocephalic and atraumatic.  Eyes:     General:        Right eye: No discharge.        Left eye: No discharge.     Conjunctiva/sclera: Conjunctivae normal.  Cardiovascular:     Rate and Rhythm: Normal rate.  Pulmonary:     Effort: Pulmonary effort is normal.  Skin:    General: Skin is warm and dry.     Findings: Lesion (R foot in boot with wrap) present. No erythema or rash.  Neurological:     Mental Status: He is alert and oriented to person, place, and time.  Psychiatric:        Mood and Affect: Mood normal.        Behavior: Behavior normal.        Thought Content: Thought content normal.     Comments: Well groomed, good eye contact, normal speech and thoughts     Results for orders placed or performed in visit on 04/09/23  Basic Metabolic Panel (BMET)  Result Value Ref Range   Glucose 111 (H) 70 - 99 mg/dL   BUN 52 (H) 8 - 27 mg/dL   Creatinine, Ser 4.09 (H) 0.76 - 1.27 mg/dL   eGFR 20 (L) >81 XB/JYN/8.29   BUN/Creatinine Ratio 16 10 - 24   Sodium 140 134 - 144 mmol/L   Potassium 5.5 (H) 3.5 - 5.2 mmol/L   Chloride 103 96 - 106 mmol/L   CO2 20 20 - 29 mmol/L   Calcium 7.7 (L) 8.6 - 10.2 mg/dL      Assessment & Plan:   Problem List Items Addressed This Visit     DM type 2 with diabetic peripheral neuropathy (HCC) - Primary   Relevant Medications   gabapentin (NEURONTIN) 100 MG capsule   Other Visit Diagnoses     Diabetic ulcer of toe of right foot associated with type 2 diabetes mellitus,  with fat layer exposed (HCC)             Right Foot Ulcer, diabetic Hospitalized at [facility] Negative for osteomyelitis Completed IV antibiotics and oral course -Continue wound care as directed.  Chronic Kidney Disease, AoCKD IV Recent hospitalization with transient worsening of  kidney function, now improved. Follow-up with nephrology scheduled 07/07/23 Cr improved trend   Neuropathic Pain Gabapentin dose reduced to 100mg  BID due to kidney function. Patient reports adequate pain control. -Continue Gabapentin 100mg  BID. Send 90-day supply to [facility].  General Health Maintenance / Followup Plans -Schedule routine follow-up appointment in March. -Ensure patient has follow-up with ophthalmology scheduled.         No orders of the defined types were placed in this encounter.   Meds ordered this encounter  Medications   gabapentin (NEURONTIN) 100 MG capsule    Sig: Take 1 capsule (100 mg total) by mouth 2 (two) times daily.    Dispense:  180 capsule    Refill:  1    Reduced dose gabapentin    Follow up plan: Return in about 3 months (around 09/15/2023), or if symptoms worsen or fail to improve, for 3 month DM A1c .  [name], DO [facility] 06/17/2023, 2:23 PM

## 2023-06-17 NOTE — Patient Instructions (Addendum)
Thank you for coming to the office today.  New order gabapentin 100mg  twice a day  Please schedule a Follow-up Appointment to: Return in about 3 months (around 09/15/2023), or if symptoms worsen or fail to improve, for 3 month DM A1c .  If you have any other questions or concerns, please feel free to call the office or send a message through MyChart. You may also schedule an earlier appointment if necessary.  Additionally, you may be receiving a survey about your experience at our office within a few days to 1 week by e-mail or mail. We value your feedback.  [name], DO [facility], [place]

## 2023-06-19 DIAGNOSIS — L97511 Non-pressure chronic ulcer of other part of right foot limited to breakdown of skin: Secondary | ICD-10-CM | POA: Diagnosis not present

## 2023-06-19 DIAGNOSIS — I110 Hypertensive heart disease with heart failure: Secondary | ICD-10-CM | POA: Diagnosis not present

## 2023-06-19 DIAGNOSIS — L02611 Cutaneous abscess of right foot: Secondary | ICD-10-CM | POA: Diagnosis not present

## 2023-06-19 DIAGNOSIS — E1142 Type 2 diabetes mellitus with diabetic polyneuropathy: Secondary | ICD-10-CM | POA: Diagnosis not present

## 2023-06-19 DIAGNOSIS — L97512 Non-pressure chronic ulcer of other part of right foot with fat layer exposed: Secondary | ICD-10-CM | POA: Diagnosis not present

## 2023-06-19 DIAGNOSIS — I509 Heart failure, unspecified: Secondary | ICD-10-CM | POA: Diagnosis not present

## 2023-06-19 DIAGNOSIS — I10 Essential (primary) hypertension: Secondary | ICD-10-CM | POA: Diagnosis not present

## 2023-06-19 DIAGNOSIS — I2510 Atherosclerotic heart disease of native coronary artery without angina pectoris: Secondary | ICD-10-CM | POA: Diagnosis not present

## 2023-06-19 DIAGNOSIS — Z89411 Acquired absence of right great toe: Secondary | ICD-10-CM | POA: Diagnosis not present

## 2023-06-19 DIAGNOSIS — L03115 Cellulitis of right lower limb: Secondary | ICD-10-CM | POA: Diagnosis not present

## 2023-06-19 DIAGNOSIS — E11621 Type 2 diabetes mellitus with foot ulcer: Secondary | ICD-10-CM | POA: Diagnosis not present

## 2023-06-19 DIAGNOSIS — E1140 Type 2 diabetes mellitus with diabetic neuropathy, unspecified: Secondary | ICD-10-CM | POA: Diagnosis not present

## 2023-07-07 DIAGNOSIS — E1122 Type 2 diabetes mellitus with diabetic chronic kidney disease: Secondary | ICD-10-CM | POA: Diagnosis not present

## 2023-07-07 DIAGNOSIS — D631 Anemia in chronic kidney disease: Secondary | ICD-10-CM | POA: Diagnosis not present

## 2023-07-07 DIAGNOSIS — I10 Essential (primary) hypertension: Secondary | ICD-10-CM | POA: Diagnosis not present

## 2023-07-07 DIAGNOSIS — N184 Chronic kidney disease, stage 4 (severe): Secondary | ICD-10-CM | POA: Diagnosis not present

## 2023-07-07 DIAGNOSIS — I5022 Chronic systolic (congestive) heart failure: Secondary | ICD-10-CM | POA: Diagnosis not present

## 2023-07-07 DIAGNOSIS — N2581 Secondary hyperparathyroidism of renal origin: Secondary | ICD-10-CM | POA: Diagnosis not present

## 2023-07-14 ENCOUNTER — Other Ambulatory Visit: Payer: Self-pay | Admitting: Family

## 2023-08-18 ENCOUNTER — Telehealth: Payer: Self-pay | Admitting: Family

## 2023-08-18 ENCOUNTER — Encounter: Payer: Medicaid Other | Admitting: Family

## 2023-08-18 NOTE — Progress Notes (Deleted)
 ADVANCED HF CLINIC NOTE  Primary Care: Smitty Cords, DO (last seen 12/24) Primary Cardiologist: Lorine Bears, MD (last seen 08/24) HF provider: Arvilla Meres, MD (last seen 06/24)   HPI:  [name] is a 63 y.o. male with history of hyperlipidemia, CAD w/ PCI and DES 10/2016, chronic systolic HF, DM2, HTN, CKD IV, ETOH abuse and PAD. S/p drug-eluting stent placement to the LAD 4/818 in setting of acute HF.  At that time, he was found to have severe LV dysfunction with an EF 25-30% which was felt to be out of proportion to the LAD disease.  He also has a history of alcohol abuse. F/u echo 2018 EF 40-45%.    Admitted 02/19/23 due to acute on chronic heart failure. Cardiology and nephrology consults done. IV diuresing held. Antibiotic given for foot infection.   Admitted 06/05/23 for right first metatarsal diabetic foot ulcer status postdebridement on the plantar aspect 11/22. Full-thickness ulceration plantar first metatarsal head. Started on broad-spectrum antibiotics with Flagyl cefepime and linezolid. Culture from I&D with <1+ MRSA. Contact precautions were initiated. IV antibiotics were transitioned to oral meds. Sodium bicarb was started for acidosis correction and was discontinued at discharge.   Echo 08/16/20: EF 45-50%.  There was concern about possible cardiac amyloidosis.  PYP scan was equivocal for amyloidosis  Echo 08/08/22 EF 55-60%  Echo 02/19/23: EF 50-55% with mild LVH, Grade II DD and mild/ moderate MR  PFTs 2/24: FEV1 2.44 (71%) FVC 3.62 (79%) Ratio 67% DLCO 60% c/w mild obstruction  He presents today for a HF f/u visit with a chief complaint of    Previous cardiac studies:  LHC 10/21/16:  Ost 2nd Diag to 2nd Diag lesion, 60 %stenosed. Mid Cx lesion, 50 %stenosed. Prox RCA lesion, 40 %stenosed. Mid RCA lesion, 20 %stenosed. A STENT XIENCE ALPINE RX 3.5X15 drug eluting stent was successfully placed. Mid LAD lesion, 90 %stenosed. Post intervention,  there is a 0% residual stenosis.  1. Severe one-vessel coronary artery disease involving mid LAD. 2. Severely reduced LV systolic function by echo. Left ventricular angiography was not performed. LVEDP was only mildly elevated at 15 mmHg. 3. Successful angioplasty and drug-eluting stent placement to the mid LAD.  ROS: All systems negative except as listed in HPI, PMH and Problem List.  SH:  Social History   Socioeconomic History   Marital status: Married    Spouse name: Not on file   Number of children: Not on file   Years of education: Not on file   Highest education level: Not on file  Occupational History   Not on file  Tobacco Use   Smoking status: Never   Smokeless tobacco: Never  Vaping Use   Vaping status: Never Used  Substance and Sexual Activity   Alcohol use: Yes    Comment: occassionally   Drug use: No   Sexual activity: Not Currently  Other Topics Concern   Not on file  Social History Narrative   Not on file   Social Drivers of Health   Financial Resource Strain: Low Risk  (06/08/2023)   Received from [facility]   Overall Financial Resource Strain (CARDIA)    Difficulty of Paying Living Expenses: Not very hard  Food Insecurity: No Food Insecurity (06/08/2023)   Received from [facility]   Hunger Vital Sign    Worried About Running Out of Food in the Last Year: Never true    Ran Out of Food in the Last Year:  Never true  Transportation Needs: No Transportation Needs (06/08/2023)   Received from [facility] - Transportation    Lack of Transportation (Medical): No    Lack of Transportation (Non-Medical): No  Physical Activity: Not on file  Stress: Not on file  Social Connections: Not on file  Intimate Partner Violence: Not At Risk (02/20/2023)   Humiliation, Afraid, Rape, and Kick questionnaire    Fear of Current or Ex-Partner: No    Emotionally Abused: No    Physically Abused: No    Sexually Abused: No    FH:  Family History   Problem Relation Age of Onset   Hypertension Mother    Heart disease Maternal Grandfather     Past Medical History:  Diagnosis Date   Ankle pain    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 10/2016 Echo: EF 25-30%, diff HK; b. 01/2017 Echo:  EF 40-45%, GrI DD; c. 08/2017 Echo: EF 40-45%, GrI DD; d. 08/2020 Echo: EF 45-50%, glob HK. Mod asymm LVH, GrI DD, Nl RV size/fxn; e. 08/2020 PYP: equivocal for ATTR cardiac amyloid.   CKD (chronic kidney disease), stage III - IV (HCC)    Coronary artery disease    a. 10/2016 Cath/PCI: LAD 55m (3.5x15 Xience Alpine DES). No other obstructive disease.   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 10/2016 Echo: EF 25-30%, diff HK; b. 01/2017 Echo:  EF 40-45%, GrI DD; c. 08/2017 Echo: EF 40-45%, GrI DD; d. 08/2020 Echo: EF 45-50%, glob HK. Mod asymm LVH, GrI DD, Nl RV size/fxn.   Pain in both feet     Current Outpatient Medications  Medication Sig Dispense Refill   ACCU-CHEK FASTCLIX LANCETS MISC Check sugar up to 3 x daily as instructed 102 each 12   ACCU-CHEK GUIDE test strip Check blood sugar up to 3 times daily as advised 100 each 12   acetaminophen (TYLENOL 8 HOUR) 650 MG CR tablet Take 1 tablet (650 mg total) by mouth every 8 (eight) hours as needed for pain.     aspirin EC 81 MG tablet Take 81 mg by mouth daily.     atorvastatin (LIPITOR) 40 MG tablet TAKE 1 TABLET BY MOUTH EVERY DAY 90 tablet 1   carvedilol (COREG) 6.25 MG tablet Take 1 tablet (6.25 mg total) by mouth 2 (two) times daily with a meal. 180 tablet 3   doxazosin (CARDURA) 1 MG tablet TAKE 2 TABLETS (2 MG TOTAL) BY MOUTH AT BEDTIME. (Patient taking differently: Take 1 mg by mouth at bedtime.) 180 tablet 1   furosemide (LASIX) 40 MG tablet TAKE 1 TABLET BY MOUTH EVERY DAY 30 tablet 3   gabapentin (NEURONTIN) 100 MG capsule Take 1 capsule (100 mg total)  by mouth 2 (two) times daily. 180 capsule 1   GVOKE HYPOPEN 2-PACK 1 MG/0.2ML SOAJ Inject 1 mg into the skin as needed (hypoglycemia). 1 mL 2   hydrALAZINE (APRESOLINE) 100 MG tablet Take 1 tablet (100 mg total) by mouth 2 (two) times daily. 60 tablet 3   Insulin Pen Needle (B-D ULTRAFINE III SHORT PEN) 31G X 8 MM MISC USE TO INJECT INSULIN NIGHTLY 100 each 6   isosorbide mononitrate (IMDUR) 60 MG 24 hr tablet TAKE 1 TABLET BY MOUTH 2 TIMES DAILY. 180 tablet 1   Lancets Misc. MISC Use  Brand compatable to  insurance and monitor to check blood sugar up to 3 times daily. ICD10 E11.9 100 each 12   LANTUS SOLOSTAR 100 UNIT/ML Solostar Pen INJECT 40 UNITS INTO THE SKIN AT BEDTIME. 45 mL 1   valsartan (DIOVAN) 80 MG tablet TAKE 1 TABLET BY MOUTH EVERY DAY 90 tablet 2   No current facility-administered medications for this visit.     PHYSICAL EXAM:  General:  Well appearing. No resp difficulty HEENT: normal Neck: supple. JVP flat. No lymphadenopathy or thryomegaly appreciated. Cor: PMI normal. Regular rhythm & rate. No rubs, gallops or murmurs. Lungs: clear Abdomen: soft, nontender, nondistended. No hepatosplenomegaly. No bruits or masses.  Extremities: no cyanosis, clubbing, rash, trace pedal edema bilaterally Neuro: alert & oriented x3, cranial nerves grossly intact. Moves all 4 extremities w/o difficulty. Affect pleasant.   ECG: 02/20/23 showed SB with LBBB   ASSESSMENT & PLAN:  1.  Ischemic cardiomyopathy with preserved ejection fraction-  - status post LAD stenting in April 2018 - Stable NYHA II - euvolemic - not weighing daily; reminded to call for overnight weight gain of > 2 pounds or a weekly weight gain of > 5 pounds - weight 201 pounds from last visit 3 months ago - Echo 08/16/20: EF 45-50%.  There was concern about possible cardiac amyloidosis. PYP 2/22 reviewed. H/CL 1.17 Negative for amyloid - Echo 08/08/22 EF 55-60%  - Echo 02/19/23: EF 50-55% with mild LVH, Grade II DD and  mild/ moderate MR - continue valsartan 80mg  daily - continue lasix 40mg  daily - continue hydralazine 100 bid (regularly forgets noon dose) and imdur 60 daily - resume carvedilol 6.25mg  BID  - not on Entresto, farxiga or spironolactone due to CKD (eGFR 16-20) - Jardiance was associated with generalized rash.  - BNP 02/19/23 was 1502.1  2.  Coronary artery disease-  - Status post LAD stenting in April 2018.  - saw cardiology Kirke Corin) 08/24 - not candidate for repeat cath with CKD IV - continue ASA 81mg  daily - continue atorvastatin 40mg  daily - LDL 01/30/23 was 71 - LHC 10/21/16:  1. Severe one-vessel coronary artery disease involving mid LAD. 2. Severely reduced LV systolic function by echo. Left ventricular angiography was not performed. LVEDP was only mildly elevated at 15 mmHg. 3. Successful angioplasty and drug-eluting stent placement to the mid LAD.   3.  Essential hypertension-  - BP  - saw PCP Althea Charon) 12/24 - continue cardura but decrease this to 1mg  daily; if BP remains stable, plan to stop it at next visit - BMP 07/07/23 showed sodium 135, potassium 4.8, creatinine 3.3 & GFR 20  4. DM2- - per PCP - A1c 06/05/23 was 6.4% - has decreased his insulin to 20 units because he was getting episodes of hypoglycemia   5.  Stage IV chronic kidney disease- - CKD IV baseline Scr 3.1-3.3 - current eGFR too low for  - saw nephrology Cherylann Ratel) 12/24  6. COPD- - PFTs 2/24: FEV1 2.44 (71%) FVC 3.62 (79%) Ratio 67% DLCO 60% c/w mild obstruction - had upcoming pulmonology appt cancelled due to conflict  7. OSA- - sleep study done 01/20/23 and showed severe sleep apnea with REI of 46.5/ hour - patient says today that he has no desire to wear CPAP equipment - continue to discuss

## 2023-08-18 NOTE — Telephone Encounter (Signed)
Patient did not show for his Heart Failure Clinic appointment on 08/18/23.

## 2023-08-22 ENCOUNTER — Telehealth: Payer: Self-pay | Admitting: Family

## 2023-08-22 NOTE — Telephone Encounter (Signed)
 Lvm to confirm appt on 08/25/23

## 2023-08-24 NOTE — Progress Notes (Deleted)
 ADVANCED HF CLINIC NOTE  Primary Care: Smitty Cords, DO (last seen 12/24) Primary Cardiologist: Lorine Bears, MD (last seen 08/24) HF provider: Arvilla Meres, MD (last seen 06/24)  Chief Complaint:    HPI:  [name] is a 63 y.o. male with history of hyperlipidemia, CAD w/ PCI and DES 10/2016, chronic systolic HF, DM2, HTN, CKD IV, ETOH abuse and PAD. S/p drug-eluting stent placement to the LAD 4/818 in setting of acute HF.  At that time, he was found to have severe LV dysfunction with an EF 25-30% which was felt to be out of proportion to the LAD disease.  He also has a history of alcohol abuse. F/u echo 2018 EF 40-45%.    Admitted 02/19/23 due to acute on chronic heart failure. Cardiology and nephrology consults done. IV diuresing held. Antibiotic given for foot infection.   Admitted 06/05/23 for right first metatarsal diabetic foot ulcer status postdebridement on the plantar aspect 11/22. Full-thickness ulceration plantar first metatarsal head. Started on broad-spectrum antibiotics with Flagyl cefepime and linezolid. Culture from I&D with <1+ MRSA. Contact precautions were initiated. IV antibiotics were transitioned to oral meds. Sodium bicarb was started for acidosis correction and was discontinued at discharge.   Echo 08/16/20: EF 45-50%.  There was concern about possible cardiac amyloidosis.  PYP scan was equivocal for amyloidosis  Echo 08/08/22 EF 55-60%  Echo 02/19/23: EF 50-55% with mild LVH, Grade II DD and mild/ moderate MR  PFTs 2/24: FEV1 2.44 (71%) FVC 3.62 (79%) Ratio 67% DLCO 60% c/w mild obstruction  He presents today for a HF f/u visit with a chief complaint of    Previous cardiac studies:  LHC 10/21/16:  Ost 2nd Diag to 2nd Diag lesion, 60 %stenosed. Mid Cx lesion, 50 %stenosed. Prox RCA lesion, 40 %stenosed. Mid RCA lesion, 20 %stenosed. A STENT XIENCE ALPINE RX 3.5X15 drug eluting stent was successfully placed. Mid LAD lesion, 90  %stenosed. Post intervention, there is a 0% residual stenosis.  1. Severe one-vessel coronary artery disease involving mid LAD. 2. Severely reduced LV systolic function by echo. Left ventricular angiography was not performed. LVEDP was only mildly elevated at 15 mmHg. 3. Successful angioplasty and drug-eluting stent placement to the mid LAD.  ROS: All systems negative except as listed in HPI, PMH and Problem List.  SH:  Social History   Socioeconomic History   Marital status: Married    Spouse name: Not on file   Number of children: Not on file   Years of education: Not on file   Highest education level: Not on file  Occupational History   Not on file  Tobacco Use   Smoking status: Never   Smokeless tobacco: Never  Vaping Use   Vaping status: Never Used  Substance and Sexual Activity   Alcohol use: Yes    Comment: occassionally   Drug use: No   Sexual activity: Not Currently  Other Topics Concern   Not on file  Social History Narrative   Not on file   Social Drivers of Health   Financial Resource Strain: Low Risk  (06/08/2023)   Received from [facility]   Overall Financial Resource Strain (CARDIA)    Difficulty of Paying Living Expenses: Not very hard  Food Insecurity: No Food Insecurity (06/08/2023)   Received from [facility]   Hunger Vital Sign    Worried About Running Out of Food in the Last Year: Never true    Ran Out of Food  in the Last Year: Never true  Transportation Needs: No Transportation Needs (06/08/2023)   Received from [facility] - Transportation    Lack of Transportation (Medical): No    Lack of Transportation (Non-Medical): No  Physical Activity: Not on file  Stress: Not on file  Social Connections: Not on file  Intimate Partner Violence: Not At Risk (02/20/2023)   Humiliation, Afraid, Rape, and Kick questionnaire    Fear of Current or Ex-Partner: No    Emotionally Abused: No    Physically Abused: No    Sexually Abused:  No    FH:  Family History  Problem Relation Age of Onset   Hypertension Mother    Heart disease Maternal Grandfather     Past Medical History:  Diagnosis Date   Ankle pain    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 10/2016 Echo: EF 25-30%, diff HK; b. 01/2017 Echo:  EF 40-45%, GrI DD; c. 08/2017 Echo: EF 40-45%, GrI DD; d. 08/2020 Echo: EF 45-50%, glob HK. Mod asymm LVH, GrI DD, Nl RV size/fxn; e. 08/2020 PYP: equivocal for ATTR cardiac amyloid.   CKD (chronic kidney disease), stage III - IV (HCC)    Coronary artery disease    a. 10/2016 Cath/PCI: LAD 63m (3.5x15 Xience Alpine DES). No other obstructive disease.   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 10/2016 Echo: EF 25-30%, diff HK; b. 01/2017 Echo:  EF 40-45%, GrI DD; c. 08/2017 Echo: EF 40-45%, GrI DD; d. 08/2020 Echo: EF 45-50%, glob HK. Mod asymm LVH, GrI DD, Nl RV size/fxn.   Pain in both feet     Current Outpatient Medications  Medication Sig Dispense Refill   ACCU-CHEK FASTCLIX LANCETS MISC Check sugar up to 3 x daily as instructed 102 each 12   ACCU-CHEK GUIDE test strip Check blood sugar up to 3 times daily as advised 100 each 12   acetaminophen (TYLENOL 8 HOUR) 650 MG CR tablet Take 1 tablet (650 mg total) by mouth every 8 (eight) hours as needed for pain.     aspirin EC 81 MG tablet Take 81 mg by mouth daily.     atorvastatin (LIPITOR) 40 MG tablet TAKE 1 TABLET BY MOUTH EVERY DAY 90 tablet 1   carvedilol (COREG) 6.25 MG tablet Take 1 tablet (6.25 mg total) by mouth 2 (two) times daily with a meal. 180 tablet 3   doxazosin (CARDURA) 1 MG tablet TAKE 2 TABLETS (2 MG TOTAL) BY MOUTH AT BEDTIME. (Patient taking differently: Take 1 mg by mouth at bedtime.) 180 tablet 1   furosemide (LASIX) 40 MG tablet TAKE 1 TABLET BY MOUTH EVERY DAY 30 tablet 3   gabapentin (NEURONTIN) 100 MG capsule  Take 1 capsule (100 mg total) by mouth 2 (two) times daily. 180 capsule 1   GVOKE HYPOPEN 2-PACK 1 MG/0.2ML SOAJ Inject 1 mg into the skin as needed (hypoglycemia). 1 mL 2   hydrALAZINE (APRESOLINE) 100 MG tablet Take 1 tablet (100 mg total) by mouth 2 (two) times daily. 60 tablet 3   Insulin Pen Needle (B-D ULTRAFINE III SHORT PEN) 31G X 8 MM MISC USE TO INJECT INSULIN NIGHTLY 100 each 6   isosorbide mononitrate (IMDUR) 60 MG 24 hr tablet TAKE 1 TABLET BY MOUTH 2 TIMES DAILY. 180 tablet 1   Lancets Misc. MISC Use  Brand compatable to insurance and monitor to check blood sugar up to 3 times daily. ICD10 E11.9 100 each 12   LANTUS SOLOSTAR 100 UNIT/ML Solostar Pen INJECT 40 UNITS INTO THE SKIN AT BEDTIME. 45 mL 1   valsartan (DIOVAN) 80 MG tablet TAKE 1 TABLET BY MOUTH EVERY DAY 90 tablet 2   No current facility-administered medications for this visit.     PHYSICAL EXAM:  General:  Well appearing. No resp difficulty HEENT: normal Neck: supple. JVP flat. No lymphadenopathy or thryomegaly appreciated. Cor: PMI normal. Regular rhythm & rate. No rubs, gallops or murmurs. Lungs: clear Abdomen: soft, nontender, nondistended. No hepatosplenomegaly. No bruits or masses.  Extremities: no cyanosis, clubbing, rash, trace pedal edema bilaterally Neuro: alert & oriented x3, cranial nerves grossly intact. Moves all 4 extremities w/o difficulty. Affect pleasant.   ECG: 02/20/23 showed SB with LBBB   ASSESSMENT & PLAN:  1.  Ischemic cardiomyopathy with preserved ejection fraction-  - status post LAD stenting in April 2018 - Stable NYHA II - euvolemic - not weighing daily; reminded to call for overnight weight gain of > 2 pounds or a weekly weight gain of > 5 pounds - weight 201 pounds from last visit 3 months ago - Echo 08/16/20: EF 45-50%.  There was concern about possible cardiac amyloidosis. PYP 2/22 reviewed. H/CL 1.17 Negative for amyloid - Echo 08/08/22 EF 55-60%  - Echo 02/19/23: EF 50-55% with  mild LVH, Grade II DD and mild/ moderate MR - continue valsartan 80mg  daily - continue lasix 40mg  daily - continue hydralazine 100 bid (regularly forgets noon dose) and imdur 60 daily - resume carvedilol 6.25mg  BID  - not on Entresto, farxiga or spironolactone due to CKD (eGFR 16-20) - Jardiance was associated with generalized rash.  - BNP 02/19/23 was 1502.1  2.  Coronary artery disease-  - Status post LAD stenting in April 2018.  - saw cardiology Kirke Corin) 08/24 - not candidate for repeat cath with CKD IV - continue ASA 81mg  daily - continue atorvastatin 40mg  daily - LDL 01/30/23 was 71 - LHC 10/21/16:  1. Severe one-vessel coronary artery disease involving mid LAD. 2. Severely reduced LV systolic function by echo. Left ventricular angiography was not performed. LVEDP was only mildly elevated at 15 mmHg. 3. Successful angioplasty and drug-eluting stent placement to the mid LAD.   3.  Essential hypertension-  - BP  - saw PCP Althea Charon) 12/24 - continue cardura but decrease this to 1mg  daily; if BP remains stable, plan to stop it at next visit - BMP 07/07/23 showed sodium 135, potassium 4.8, creatinine 3.3 & GFR 20  4. DM2- - per PCP - A1c 06/05/23 was 6.4% - has decreased his insulin to 20 units because he was getting episodes of hypoglycemia   5.  Stage IV chronic kidney disease- - CKD IV baseline Scr 3.1-3.3 - current eGFR too low for  - saw nephrology Cherylann Ratel) 12/24  6. COPD- - PFTs 2/24: FEV1 2.44 (71%) FVC 3.62 (79%) Ratio 67% DLCO 60% c/w mild obstruction - had upcoming pulmonology appt cancelled due to conflict  7. OSA- - sleep study done 01/20/23 and showed severe sleep apnea with REI of 46.5/ hour - patient says today that he has no desire to wear CPAP equipment - continue to discuss

## 2023-08-25 ENCOUNTER — Encounter: Payer: Medicaid Other | Admitting: Family

## 2023-08-25 NOTE — Telephone Encounter (Signed)
 Patient did not show for his Heart Failure Clinic appointment on 08/25/23.

## 2023-09-05 DIAGNOSIS — E1142 Type 2 diabetes mellitus with diabetic polyneuropathy: Secondary | ICD-10-CM | POA: Diagnosis not present

## 2023-09-05 DIAGNOSIS — E11621 Type 2 diabetes mellitus with foot ulcer: Secondary | ICD-10-CM | POA: Diagnosis not present

## 2023-09-05 DIAGNOSIS — I2510 Atherosclerotic heart disease of native coronary artery without angina pectoris: Secondary | ICD-10-CM | POA: Diagnosis not present

## 2023-09-05 DIAGNOSIS — I110 Hypertensive heart disease with heart failure: Secondary | ICD-10-CM | POA: Diagnosis not present

## 2023-09-05 DIAGNOSIS — E1140 Type 2 diabetes mellitus with diabetic neuropathy, unspecified: Secondary | ICD-10-CM | POA: Diagnosis not present

## 2023-09-05 DIAGNOSIS — I509 Heart failure, unspecified: Secondary | ICD-10-CM | POA: Diagnosis not present

## 2023-09-05 DIAGNOSIS — L97519 Non-pressure chronic ulcer of other part of right foot with unspecified severity: Secondary | ICD-10-CM | POA: Diagnosis not present

## 2023-09-05 DIAGNOSIS — L97512 Non-pressure chronic ulcer of other part of right foot with fat layer exposed: Secondary | ICD-10-CM | POA: Diagnosis not present

## 2023-09-05 DIAGNOSIS — Z89411 Acquired absence of right great toe: Secondary | ICD-10-CM | POA: Diagnosis not present

## 2023-09-05 DIAGNOSIS — N179 Acute kidney failure, unspecified: Secondary | ICD-10-CM | POA: Diagnosis not present

## 2023-09-10 DIAGNOSIS — E11621 Type 2 diabetes mellitus with foot ulcer: Secondary | ICD-10-CM | POA: Diagnosis not present

## 2023-09-10 DIAGNOSIS — I2510 Atherosclerotic heart disease of native coronary artery without angina pectoris: Secondary | ICD-10-CM | POA: Diagnosis not present

## 2023-09-10 DIAGNOSIS — Z794 Long term (current) use of insulin: Secondary | ICD-10-CM | POA: Diagnosis not present

## 2023-09-10 DIAGNOSIS — L97512 Non-pressure chronic ulcer of other part of right foot with fat layer exposed: Secondary | ICD-10-CM | POA: Diagnosis not present

## 2023-09-10 DIAGNOSIS — E785 Hyperlipidemia, unspecified: Secondary | ICD-10-CM | POA: Diagnosis not present

## 2023-09-10 DIAGNOSIS — I70201 Unspecified atherosclerosis of native arteries of extremities, right leg: Secondary | ICD-10-CM | POA: Diagnosis not present

## 2023-09-10 DIAGNOSIS — I10 Essential (primary) hypertension: Secondary | ICD-10-CM | POA: Diagnosis not present

## 2023-09-10 DIAGNOSIS — E1151 Type 2 diabetes mellitus with diabetic peripheral angiopathy without gangrene: Secondary | ICD-10-CM | POA: Diagnosis not present

## 2023-09-12 DIAGNOSIS — Z4800 Encounter for change or removal of nonsurgical wound dressing: Secondary | ICD-10-CM | POA: Diagnosis not present

## 2023-09-15 ENCOUNTER — Ambulatory Visit: Payer: Self-pay | Admitting: Family Medicine

## 2023-09-16 ENCOUNTER — Other Ambulatory Visit: Payer: Self-pay | Admitting: Cardiovascular Disease

## 2023-09-16 DIAGNOSIS — E1169 Type 2 diabetes mellitus with other specified complication: Secondary | ICD-10-CM

## 2023-09-16 NOTE — Telephone Encounter (Signed)
Please contact pt for future appointment. Pt overdue for 6 month f/u. Pt needing refills. 

## 2023-09-18 DIAGNOSIS — Z89421 Acquired absence of other right toe(s): Secondary | ICD-10-CM | POA: Diagnosis not present

## 2023-09-18 DIAGNOSIS — E11621 Type 2 diabetes mellitus with foot ulcer: Secondary | ICD-10-CM | POA: Diagnosis not present

## 2023-09-18 DIAGNOSIS — Z89422 Acquired absence of other left toe(s): Secondary | ICD-10-CM | POA: Diagnosis not present

## 2023-09-18 DIAGNOSIS — L97412 Non-pressure chronic ulcer of right heel and midfoot with fat layer exposed: Secondary | ICD-10-CM | POA: Diagnosis not present

## 2023-09-18 DIAGNOSIS — E1151 Type 2 diabetes mellitus with diabetic peripheral angiopathy without gangrene: Secondary | ICD-10-CM | POA: Diagnosis not present

## 2023-09-18 DIAGNOSIS — Z89411 Acquired absence of right great toe: Secondary | ICD-10-CM | POA: Diagnosis not present

## 2023-09-18 DIAGNOSIS — E1142 Type 2 diabetes mellitus with diabetic polyneuropathy: Secondary | ICD-10-CM | POA: Diagnosis not present

## 2023-09-18 DIAGNOSIS — I2510 Atherosclerotic heart disease of native coronary artery without angina pectoris: Secondary | ICD-10-CM | POA: Diagnosis not present

## 2023-09-18 DIAGNOSIS — I509 Heart failure, unspecified: Secondary | ICD-10-CM | POA: Diagnosis not present

## 2023-09-22 ENCOUNTER — Telehealth: Payer: Self-pay | Admitting: Family

## 2023-09-22 DIAGNOSIS — E11621 Type 2 diabetes mellitus with foot ulcer: Secondary | ICD-10-CM | POA: Diagnosis not present

## 2023-09-22 DIAGNOSIS — L97509 Non-pressure chronic ulcer of other part of unspecified foot with unspecified severity: Secondary | ICD-10-CM | POA: Diagnosis not present

## 2023-09-22 NOTE — Telephone Encounter (Signed)
 Pt confirmed appt for 09/23/23

## 2023-09-23 ENCOUNTER — Ambulatory Visit: Attending: Family | Admitting: Family

## 2023-09-23 ENCOUNTER — Encounter: Payer: Self-pay | Admitting: Family

## 2023-09-23 VITALS — BP 122/64 | HR 65 | Wt 189.0 lb

## 2023-09-23 DIAGNOSIS — E1122 Type 2 diabetes mellitus with diabetic chronic kidney disease: Secondary | ICD-10-CM | POA: Diagnosis not present

## 2023-09-23 DIAGNOSIS — Z955 Presence of coronary angioplasty implant and graft: Secondary | ICD-10-CM | POA: Insufficient documentation

## 2023-09-23 DIAGNOSIS — E1169 Type 2 diabetes mellitus with other specified complication: Secondary | ICD-10-CM | POA: Insufficient documentation

## 2023-09-23 DIAGNOSIS — I130 Hypertensive heart and chronic kidney disease with heart failure and stage 1 through stage 4 chronic kidney disease, or unspecified chronic kidney disease: Secondary | ICD-10-CM | POA: Insufficient documentation

## 2023-09-23 DIAGNOSIS — F1010 Alcohol abuse, uncomplicated: Secondary | ICD-10-CM | POA: Insufficient documentation

## 2023-09-23 DIAGNOSIS — I255 Ischemic cardiomyopathy: Secondary | ICD-10-CM | POA: Diagnosis not present

## 2023-09-23 DIAGNOSIS — E1151 Type 2 diabetes mellitus with diabetic peripheral angiopathy without gangrene: Secondary | ICD-10-CM | POA: Diagnosis not present

## 2023-09-23 DIAGNOSIS — I5032 Chronic diastolic (congestive) heart failure: Secondary | ICD-10-CM | POA: Insufficient documentation

## 2023-09-23 DIAGNOSIS — E1142 Type 2 diabetes mellitus with diabetic polyneuropathy: Secondary | ICD-10-CM | POA: Diagnosis not present

## 2023-09-23 DIAGNOSIS — G4733 Obstructive sleep apnea (adult) (pediatric): Secondary | ICD-10-CM | POA: Insufficient documentation

## 2023-09-23 DIAGNOSIS — J449 Chronic obstructive pulmonary disease, unspecified: Secondary | ICD-10-CM | POA: Insufficient documentation

## 2023-09-23 DIAGNOSIS — I10 Essential (primary) hypertension: Secondary | ICD-10-CM

## 2023-09-23 DIAGNOSIS — I5022 Chronic systolic (congestive) heart failure: Secondary | ICD-10-CM | POA: Diagnosis present

## 2023-09-23 DIAGNOSIS — R0602 Shortness of breath: Secondary | ICD-10-CM | POA: Diagnosis present

## 2023-09-23 DIAGNOSIS — Z794 Long term (current) use of insulin: Secondary | ICD-10-CM | POA: Insufficient documentation

## 2023-09-23 DIAGNOSIS — I2510 Atherosclerotic heart disease of native coronary artery without angina pectoris: Secondary | ICD-10-CM | POA: Insufficient documentation

## 2023-09-23 DIAGNOSIS — N184 Chronic kidney disease, stage 4 (severe): Secondary | ICD-10-CM | POA: Insufficient documentation

## 2023-09-23 DIAGNOSIS — E785 Hyperlipidemia, unspecified: Secondary | ICD-10-CM | POA: Diagnosis not present

## 2023-09-23 MED ORDER — DOXAZOSIN MESYLATE 1 MG PO TABS: 1.0000 mg | ORAL_TABLET | Freq: Every day | ORAL | 2 refills | Status: DC

## 2023-09-23 MED ORDER — ISOSORBIDE MONONITRATE ER 60 MG PO TB24: 60.0000 mg | ORAL_TABLET | Freq: Two times a day (BID) | ORAL | 2 refills | Status: DC

## 2023-09-23 MED ORDER — ATORVASTATIN CALCIUM 40 MG PO TABS: 40.0000 mg | ORAL_TABLET | Freq: Every day | ORAL | 2 refills | Status: DC

## 2023-09-23 NOTE — Progress Notes (Signed)
 ADVANCED HF CLINIC NOTE  Primary Care: Smitty Cords, DO (last seen 12/24) Primary Cardiologist: Lorine Bears, MD (last seen 08/24) HF provider: Arvilla Meres, MD (last seen 06/24)  Chief Complaint: shortness of breath   HPI:  [name] is a 63 y.o. male with history of hyperlipidemia, CAD w/ PCI and DES 10/2016, chronic systolic HF, DM2, HTN, CKD IV, ETOH abuse and PAD. S/p drug-eluting stent placement to the LAD 4/818 in setting of acute HF.  At that time, he was found to have severe LV dysfunction with an EF 25-30% which was felt to be out of proportion to the LAD disease.  He also has a history of alcohol abuse. F/u echo 2018 EF 40-45%.    Admitted 02/19/23 due to acute on chronic heart failure. Cardiology and nephrology consults done. IV diuresing held. Antibiotic given for foot infection.   Admitted 06/05/23 for right first metatarsal diabetic foot ulcer status postdebridement on the plantar aspect 11/22. Full-thickness ulceration plantar first metatarsal head. Started on broad-spectrum antibiotics with Flagyl cefepime and linezolid. Culture from I&D with <1+ MRSA. Contact precautions were initiated. IV antibiotics were transitioned to oral meds. Sodium bicarb was started for acidosis correction and was discontinued at discharge.   Echo 08/16/20: EF 45-50%.  There was concern about possible cardiac amyloidosis.  PYP scan was equivocal for amyloidosis  Echo 08/08/22 EF 55-60%  Echo 02/19/23: EF 50-55% with mild LVH, Grade II DD and mild/ moderate MR  PFTs 2/24: FEV1 2.44 (71%) FVC 3.62 (79%) Ratio 67% DLCO 60% c/w mild obstruction  He presents today for a HF f/u visit with a chief complaint of minimal shortness of breath. Has chronic fatigue and gradual weight loss. Sleeping well on 1 pillow. Glucose in the 170's. Denies chest pain, cough, palpitations, abdominal distention, pedal edema or dizziness.   Has been out of atorvastatin and isosorbide MN for ~ 1 week  as he needed a new RX for them.   Previous cardiac studies:  LHC 10/21/16:  Ost 2nd Diag to 2nd Diag lesion, 60 %stenosed. Mid Cx lesion, 50 %stenosed. Prox RCA lesion, 40 %stenosed. Mid RCA lesion, 20 %stenosed. A STENT XIENCE ALPINE RX 3.5X15 drug eluting stent was successfully placed. Mid LAD lesion, 90 %stenosed. Post intervention, there is a 0% residual stenosis.  1. Severe one-vessel coronary artery disease involving mid LAD. 2. Severely reduced LV systolic function by echo. Left ventricular angiography was not performed. LVEDP was only mildly elevated at 15 mmHg. 3. Successful angioplasty and drug-eluting stent placement to the mid LAD.  ROS: All systems negative except as listed in HPI, PMH and Problem List.  SH:  Social History   Socioeconomic History   Marital status: Married    Spouse name: Not on file   Number of children: Not on file   Years of education: Not on file   Highest education level: Not on file  Occupational History   Not on file  Tobacco Use   Smoking status: Never   Smokeless tobacco: Never  Vaping Use   Vaping status: Never Used  Substance and Sexual Activity   Alcohol use: Yes    Comment: occassionally   Drug use: No   Sexual activity: Not Currently  Other Topics Concern   Not on file  Social History Narrative   Not on file   Social Drivers of Health   Financial Resource Strain: Low Risk  (06/08/2023)   Received from [facility]   Overall Financial Resource  Strain (CARDIA)    Difficulty of Paying Living Expenses: Not very hard  Food Insecurity: No Food Insecurity (06/08/2023)   Received from [facility]   Hunger Vital Sign    Worried About Running Out of Food in the Last Year: Never true    Ran Out of Food in the Last Year: Never true  Transportation Needs: No Transportation Needs (06/08/2023)   Received from [facility] - Transportation    Lack of Transportation (Medical): No    Lack of Transportation  (Non-Medical): No  Physical Activity: Not on file  Stress: Not on file  Social Connections: Not on file  Intimate Partner Violence: Not At Risk (02/20/2023)   Humiliation, Afraid, Rape, and Kick questionnaire    Fear of Current or Ex-Partner: No    Emotionally Abused: No    Physically Abused: No    Sexually Abused: No    FH:  Family History  Problem Relation Age of Onset   Hypertension Mother    Heart disease Maternal Grandfather     Past Medical History:  Diagnosis Date   Ankle pain    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 10/2016 Echo: EF 25-30%, diff HK; b. 01/2017 Echo:  EF 40-45%, GrI DD; c. 08/2017 Echo: EF 40-45%, GrI DD; d. 08/2020 Echo: EF 45-50%, glob HK. Mod asymm LVH, GrI DD, Nl RV size/fxn; e. 08/2020 PYP: equivocal for ATTR cardiac amyloid.   CKD (chronic kidney disease), stage III - IV (HCC)    Coronary artery disease    a. 10/2016 Cath/PCI: LAD 19m (3.5x15 Xience Alpine DES). No other obstructive disease.   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 10/2016 Echo: EF 25-30%, diff HK; b. 01/2017 Echo:  EF 40-45%, GrI DD; c. 08/2017 Echo: EF 40-45%, GrI DD; d. 08/2020 Echo: EF 45-50%, glob HK. Mod asymm LVH, GrI DD, Nl RV size/fxn.   Pain in both feet     Current Outpatient Medications  Medication Sig Dispense Refill   ACCU-CHEK FASTCLIX LANCETS MISC Check sugar up to 3 x daily as instructed 102 each 12   ACCU-CHEK GUIDE test strip Check blood sugar up to 3 times daily as advised 100 each 12   acetaminophen (TYLENOL 8 HOUR) 650 MG CR tablet Take 1 tablet (650 mg total) by mouth every 8 (eight) hours as needed for pain.     aspirin EC 81 MG tablet Take 81 mg by mouth daily.     atorvastatin (LIPITOR) 40 MG tablet TAKE 1 TABLET BY MOUTH EVERY DAY 90 tablet 3   carvedilol (COREG) 6.25 MG tablet Take 1 tablet (6.25 mg total) by mouth 2  (two) times daily with a meal. 180 tablet 3   doxazosin (CARDURA) 1 MG tablet TAKE 2 TABLETS (2 MG TOTAL) BY MOUTH AT BEDTIME. (Patient taking differently: Take 1 mg by mouth at bedtime.) 180 tablet 1   furosemide (LASIX) 40 MG tablet TAKE 1 TABLET BY MOUTH EVERY DAY 30 tablet 3   gabapentin (NEURONTIN) 100 MG capsule Take 1 capsule (100 mg total) by mouth 2 (two) times daily. 180 capsule 1   GVOKE HYPOPEN 2-PACK 1 MG/0.2ML SOAJ Inject 1 mg into the skin as needed (hypoglycemia). 1 mL 2   hydrALAZINE (APRESOLINE) 100 MG tablet Take 1 tablet (100 mg total) by mouth 2 (two) times  daily. 60 tablet 3   Insulin Pen Needle (B-D ULTRAFINE III SHORT PEN) 31G X 8 MM MISC USE TO INJECT INSULIN NIGHTLY 100 each 6   isosorbide mononitrate (IMDUR) 60 MG 24 hr tablet TAKE 1 TABLET BY MOUTH 2 TIMES DAILY. 180 tablet 1   Lancets Misc. MISC Use  Brand compatable to insurance and monitor to check blood sugar up to 3 times daily. ICD10 E11.9 100 each 12   LANTUS SOLOSTAR 100 UNIT/ML Solostar Pen INJECT 40 UNITS INTO THE SKIN AT BEDTIME. 45 mL 1   valsartan (DIOVAN) 80 MG tablet TAKE 1 TABLET BY MOUTH EVERY DAY 90 tablet 2   No current facility-administered medications for this visit.   Vitals:   09/23/23 1052  BP: 122/64  Pulse: 65  SpO2: 96%  Weight: 189 lb (85.7 kg)   Wt Readings from Last 3 Encounters:  09/23/23 189 lb (85.7 kg)  06/17/23 192 lb (87.1 kg)  05/14/23 201 lb (91.2 kg)   Lab Results  Component Value Date   CREATININE 3.32 (H) 04/09/2023   CREATININE 3.73 (H) 02/23/2023   CREATININE 3.83 (H) 02/22/2023   PHYSICAL EXAM:  General: Well appearing. No resp difficulty HEENT: normal Neck: supple, no JVD Cor: Regular rhythm, rate. No rubs, gallops or murmurs Lungs: clear Abdomen: soft, nontender, nondistended. Extremities: no cyanosis, clubbing, rash, edema Neuro: alert & oriented X 3. Moves all 4 extremities w/o difficulty. Affect pleasant   ECG: not done   ASSESSMENT &  PLAN:  1.  Ischemic cardiomyopathy with preserved ejection fraction-  - status post LAD stenting in April 2018 - Stable NYHA II - euvolemic - weight down 11 pounds from last visit >4 months ago - Echo 08/16/20: EF 45-50%.  There was concern about possible cardiac amyloidosis. PYP 2/22 reviewed. H/CL 1.17 Negative for amyloid - Echo 08/08/22 EF 55-60%  - Echo 02/19/23: EF 50-55% with mild LVH, Grade II DD and mild/ moderate MR - continue carvedilol 6.25mg  BID  - continue lasix 40mg  daily - continue hydralazine 100 bid (regularly forgets noon dose) and imdur 60 daily (refilled today) - continue valsartan 80mg  daily - not on Entresto, farxiga or spironolactone due to CKD (eGFR 16-20) - Jardiance was associated with generalized rash.  - BNP 02/19/23 was 1502.1  2.  Coronary artery disease-  - Status post LAD stenting in April 2018.  - saw cardiology Kirke Corin) 08/24 - not candidate for repeat cath with CKD IV - continue ASA 81mg  daily - LHC 10/21/16:  1. Severe one-vessel coronary artery disease involving mid LAD. 2. Severely reduced LV systolic function by echo. Left ventricular angiography was not performed. LVEDP was only mildly elevated at 15 mmHg. 3. Successful angioplasty and drug-eluting stent placement to the mid LAD.   3.  Essential hypertension-  - BP 122/64 - saw PCP Althea Charon) 12/24 - stop cardura - BMP 07/07/23 showed sodium 135, potassium 4.8, creatinine 3.3 & GFR 20 - BMET today  4. DM2- - per PCP - A1c 06/05/23 was 6.4% - saw nephrology Cherylann Ratel) 12/24   5. Hyperlipidemia- - refilled atorvastatin 40mg   - LDL 01/30/23 was 71 - lipid panel today  6. COPD- - PFTs 2/24: FEV1 2.44 (71%) FVC 3.62 (79%) Ratio 67% DLCO 60% c/w mild obstruction  7. OSA- - sleep study done 01/20/23 and showed severe sleep apnea with REI of 46.5/ hour - continues to not want to wear CPAP   Return in 6 months, sooner if needed.   Clarisa Kindred, FNP

## 2023-09-23 NOTE — Patient Instructions (Addendum)
 Medication Changes:  No medication changes   Lab Work:  Go DOWN to LOWER LEVEL (LL) to have your blood work completed inside of [employer] office.  We will only call you if the results are abnormal or if the provider would like to make medication changes.   Follow-Up in: 6 months with Clarisa Kindred, FNP  At the [facility], you and your health needs are our priority. We have a designated team specialized in the treatment of Heart Failure. This Care Team includes your primary Heart Failure Specialized Cardiologist (physician), Advanced Practice Providers (APPs- Physician Assistants and Nurse Practitioners), and Pharmacist who all work together to provide you with the care you need, when you need it.   You may see any of the following providers on your designated Care Team at your next follow up:  Dr. Arvilla Meres Dr. Marca Ancona Dr. Dorthula Nettles Dr. Theresia Bough Clarisa Kindred, FNP Enos Fling, RPH-CPP  Please be sure to bring in all your medications bottles to every appointment.   Need to Contact us:  If you have any questions or concerns before your next appointment please send [place] a message through [place] or call our office at [phone].    TO LEAVE A MESSAGE FOR THE NURSE SELECT OPTION 2, PLEASE LEAVE A MESSAGE INCLUDING: YOUR NAME DATE OF BIRTH CALL BACK NUMBER REASON FOR CALL**this is important as we prioritize the call backs  YOU WILL RECEIVE A CALL BACK THE SAME DAY AS LONG AS YOU CALL BEFORE 4:00 PM

## 2023-09-24 ENCOUNTER — Telehealth: Payer: Self-pay | Admitting: Family

## 2023-09-24 LAB — BASIC METABOLIC PANEL
BUN/Creatinine Ratio: 21 (ref 10–24)
BUN: 63 mg/dL — ABNORMAL HIGH (ref 8–27)
CO2: 19 mmol/L — ABNORMAL LOW (ref 20–29)
Calcium: 8.3 mg/dL — ABNORMAL LOW (ref 8.6–10.2)
Chloride: 102 mmol/L (ref 96–106)
Creatinine, Ser: 3.01 mg/dL — ABNORMAL HIGH (ref 0.76–1.27)
Glucose: 296 mg/dL — ABNORMAL HIGH (ref 70–99)
Potassium: 5.7 mmol/L — ABNORMAL HIGH (ref 3.5–5.2)
Sodium: 135 mmol/L (ref 134–144)
eGFR: 23 mL/min/{1.73_m2} — ABNORMAL LOW (ref >59)

## 2023-09-24 LAB — LIPID PANEL
Chol/HDL Ratio: 3.9 ratio (ref 0.0–5.0)
Cholesterol, Total: 171 mg/dL (ref 100–199)
HDL: 44 mg/dL (ref >39)
LDL Chol Calc (NIH): 91 mg/dL (ref 0–99)
Triglycerides: 211 mg/dL — ABNORMAL HIGH (ref 0–149)
VLDL Cholesterol Cal: 36 mg/dL (ref 5–40)

## 2023-09-24 MED ORDER — VALSARTAN 80 MG PO TABS: 40.0000 mg | ORAL_TABLET | Freq: Every day | ORAL | Status: DC

## 2023-09-24 NOTE — Telephone Encounter (Signed)
-----   Message from Delma Freeze sent at 09/24/2023  9:44 AM EDT ----- Kidney function is a little better although potassium is rising. Decrease valsartan to 1/2 tablet daily (40mg ). Make sure not using NoSalt seasoning and limit high potassium foods (bananas, dark green leafy vegetables, potatoes)   Lipids are elevated so resume the atorvastatin and limit fried/ fatty foods. We can recheck this in a few months.

## 2023-09-24 NOTE — Telephone Encounter (Signed)
 Lab results and medication changes per Clarisa Kindred, FNP, reviewed with patient. Pt verbalized understanding and agreement. He states back that he will decrease valsartan to 0.5 tablet daily and resume taking atorvastatin, and verbalized understanding of dietary changes. Medication list updated.

## 2023-09-25 DIAGNOSIS — L97512 Non-pressure chronic ulcer of other part of right foot with fat layer exposed: Secondary | ICD-10-CM | POA: Diagnosis not present

## 2023-09-25 DIAGNOSIS — E11621 Type 2 diabetes mellitus with foot ulcer: Secondary | ICD-10-CM | POA: Diagnosis not present

## 2023-09-26 ENCOUNTER — Other Ambulatory Visit: Payer: Self-pay

## 2023-09-26 MED ORDER — VALSARTAN 40 MG PO TABS: 40.0000 mg | ORAL_TABLET | Freq: Every day | ORAL | 2 refills | Status: DC

## 2023-10-02 DIAGNOSIS — I2510 Atherosclerotic heart disease of native coronary artery without angina pectoris: Secondary | ICD-10-CM | POA: Diagnosis not present

## 2023-10-02 DIAGNOSIS — E1140 Type 2 diabetes mellitus with diabetic neuropathy, unspecified: Secondary | ICD-10-CM | POA: Diagnosis not present

## 2023-10-02 DIAGNOSIS — E11621 Type 2 diabetes mellitus with foot ulcer: Secondary | ICD-10-CM | POA: Diagnosis not present

## 2023-10-02 DIAGNOSIS — I739 Peripheral vascular disease, unspecified: Secondary | ICD-10-CM | POA: Diagnosis not present

## 2023-10-02 DIAGNOSIS — Z89411 Acquired absence of right great toe: Secondary | ICD-10-CM | POA: Diagnosis not present

## 2023-10-02 DIAGNOSIS — M216X1 Other acquired deformities of right foot: Secondary | ICD-10-CM | POA: Diagnosis not present

## 2023-10-02 DIAGNOSIS — I110 Hypertensive heart disease with heart failure: Secondary | ICD-10-CM | POA: Diagnosis not present

## 2023-10-02 DIAGNOSIS — E1151 Type 2 diabetes mellitus with diabetic peripheral angiopathy without gangrene: Secondary | ICD-10-CM | POA: Diagnosis not present

## 2023-10-02 DIAGNOSIS — L97411 Non-pressure chronic ulcer of right heel and midfoot limited to breakdown of skin: Secondary | ICD-10-CM | POA: Diagnosis not present

## 2023-10-02 DIAGNOSIS — Z89422 Acquired absence of other left toe(s): Secondary | ICD-10-CM | POA: Diagnosis not present

## 2023-10-02 DIAGNOSIS — I509 Heart failure, unspecified: Secondary | ICD-10-CM | POA: Diagnosis not present

## 2023-10-09 ENCOUNTER — Other Ambulatory Visit: Payer: Self-pay | Admitting: Family

## 2023-10-09 DIAGNOSIS — L97509 Non-pressure chronic ulcer of other part of unspecified foot with unspecified severity: Secondary | ICD-10-CM | POA: Diagnosis not present

## 2023-10-09 DIAGNOSIS — E11621 Type 2 diabetes mellitus with foot ulcer: Secondary | ICD-10-CM | POA: Diagnosis not present

## 2023-10-14 DIAGNOSIS — I771 Stricture of artery: Secondary | ICD-10-CM | POA: Diagnosis not present

## 2023-10-14 DIAGNOSIS — E1142 Type 2 diabetes mellitus with diabetic polyneuropathy: Secondary | ICD-10-CM | POA: Diagnosis not present

## 2023-10-14 DIAGNOSIS — I509 Heart failure, unspecified: Secondary | ICD-10-CM | POA: Diagnosis not present

## 2023-10-14 DIAGNOSIS — L97518 Non-pressure chronic ulcer of other part of right foot with other specified severity: Secondary | ICD-10-CM | POA: Diagnosis not present

## 2023-10-14 DIAGNOSIS — Z79899 Other long term (current) drug therapy: Secondary | ICD-10-CM | POA: Diagnosis not present

## 2023-10-14 DIAGNOSIS — E113299 Type 2 diabetes mellitus with mild nonproliferative diabetic retinopathy without macular edema, unspecified eye: Secondary | ICD-10-CM | POA: Diagnosis not present

## 2023-10-14 DIAGNOSIS — Z794 Long term (current) use of insulin: Secondary | ICD-10-CM | POA: Diagnosis not present

## 2023-10-14 DIAGNOSIS — I110 Hypertensive heart disease with heart failure: Secondary | ICD-10-CM | POA: Diagnosis not present

## 2023-10-14 DIAGNOSIS — I700 Atherosclerosis of aorta: Secondary | ICD-10-CM | POA: Diagnosis not present

## 2023-10-14 DIAGNOSIS — S91301A Unspecified open wound, right foot, initial encounter: Secondary | ICD-10-CM | POA: Diagnosis not present

## 2023-10-14 DIAGNOSIS — E11621 Type 2 diabetes mellitus with foot ulcer: Secondary | ICD-10-CM | POA: Diagnosis not present

## 2023-10-14 DIAGNOSIS — Z89421 Acquired absence of other right toe(s): Secondary | ICD-10-CM | POA: Diagnosis not present

## 2023-10-14 DIAGNOSIS — I2510 Atherosclerotic heart disease of native coronary artery without angina pectoris: Secondary | ICD-10-CM | POA: Diagnosis not present

## 2023-10-14 DIAGNOSIS — Z7982 Long term (current) use of aspirin: Secondary | ICD-10-CM | POA: Diagnosis not present

## 2023-10-14 DIAGNOSIS — I70201 Unspecified atherosclerosis of native arteries of extremities, right leg: Secondary | ICD-10-CM | POA: Diagnosis not present

## 2023-10-14 DIAGNOSIS — E1151 Type 2 diabetes mellitus with diabetic peripheral angiopathy without gangrene: Secondary | ICD-10-CM | POA: Diagnosis not present

## 2023-10-16 DIAGNOSIS — M216X1 Other acquired deformities of right foot: Secondary | ICD-10-CM | POA: Diagnosis not present

## 2023-10-16 DIAGNOSIS — E1142 Type 2 diabetes mellitus with diabetic polyneuropathy: Secondary | ICD-10-CM | POA: Diagnosis not present

## 2023-10-16 DIAGNOSIS — L97511 Non-pressure chronic ulcer of other part of right foot limited to breakdown of skin: Secondary | ICD-10-CM | POA: Diagnosis not present

## 2023-10-16 DIAGNOSIS — E11621 Type 2 diabetes mellitus with foot ulcer: Secondary | ICD-10-CM | POA: Diagnosis not present

## 2023-10-23 ENCOUNTER — Ambulatory Visit: Attending: Nurse Practitioner | Admitting: Nurse Practitioner

## 2023-10-23 DIAGNOSIS — Z4800 Encounter for change or removal of nonsurgical wound dressing: Secondary | ICD-10-CM | POA: Diagnosis not present

## 2023-10-23 DIAGNOSIS — L97511 Non-pressure chronic ulcer of other part of right foot limited to breakdown of skin: Secondary | ICD-10-CM | POA: Diagnosis not present

## 2023-10-23 DIAGNOSIS — E11621 Type 2 diabetes mellitus with foot ulcer: Secondary | ICD-10-CM | POA: Diagnosis not present

## 2023-10-23 NOTE — Progress Notes (Deleted)
 Office Visit    Patient Name: [name] Date of Encounter: 10/23/2023  Primary Care Provider:  Smitty Cords, DO Primary Cardiologist:  Lorine Bears, MD  Chief Complaint    63 y.o. male   Past Medical History  Subjective   Past Medical History:  Diagnosis Date   Ankle pain    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 10/2016 Echo: EF 25-30%, diff HK; b. 01/2017 Echo:  EF 40-45%, GrI DD; c. 08/2017 Echo: EF 40-45%, GrI DD; d. 08/2020 Echo: EF 45-50%, glob HK. Mod asymm LVH, GrI DD, Nl RV size/fxn; e. 08/2020 PYP: equivocal for ATTR cardiac amyloid.   CKD (chronic kidney disease), stage III - IV (HCC)    Coronary artery disease    a. 10/2016 Cath/PCI: LAD 106m (3.5x15 Xience Alpine DES). No other obstructive disease.   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 10/2016 Echo: EF 25-30%, diff HK; b. 01/2017 Echo:  EF 40-45%, GrI DD; c. 08/2017 Echo: EF 40-45%, GrI DD; d. 08/2020 Echo: EF 45-50%, glob HK. Mod asymm LVH, GrI DD, Nl RV size/fxn.   Pain in both feet    Past Surgical History:  Procedure Laterality Date   CARDIAC CATHETERIZATION     CORONARY STENT INTERVENTION N/A 10/21/2016   Procedure: Coronary Stent Intervention;  Surgeon: Iran Ouch, MD;  Location: [facility] INVASIVE CV LAB;  Service: Cardiovascular;  Laterality: N/A;   LEFT HEART CATH AND CORONARY ANGIOGRAPHY N/A 10/21/2016   Procedure: Left Heart Cath and Coronary Angiography;  Surgeon: Iran Ouch, MD;  Location: [facility] INVASIVE CV LAB;  Service: Cardiovascular;  Laterality: N/A;   TIBIA FRACTURE SURGERY Right 2002    Allergies  Allergies  Allergen Reactions   Sulfamethoxazole-Trimethoprim Other (See Comments)    AKI    Amlodipine Other (See Comments)    Edema   Lisinopril     Hyperkalemia    Jardiance [Empagliflozin] Rash   Terbinafine And Related  Rash      History of Present Illness      63 y.o. y/o male {There is no content from the last Narrative History section.}     Objective  Home Medications    Current Outpatient Medications  Medication Sig Dispense Refill   ACCU-CHEK FASTCLIX LANCETS MISC Check sugar up to 3 x daily as instructed 102 each 12   ACCU-CHEK GUIDE test strip Check blood sugar up to 3 times daily as advised 100 each 12   acetaminophen (TYLENOL 8 HOUR) 650 MG CR tablet Take 1 tablet (650 mg total) by mouth every 8 (eight) hours as needed for pain.     aspirin EC 81 MG tablet Take 81 mg by mouth daily.     atorvastatin (LIPITOR) 40 MG tablet Take 1 tablet (40 mg total) by mouth daily. (Patient not taking: Reported on 09/23/2023) 90 tablet 2   carvedilol (COREG) 6.25 MG tablet Take 1 tablet (6.25 mg total) by mouth 2 (two) times daily with a meal. 180 tablet 3   furosemide (LASIX) 40 MG tablet TAKE 1 TABLET BY MOUTH EVERY DAY 30 tablet 3   gabapentin (NEURONTIN) 100 MG capsule Take 1 capsule (100 mg total) by mouth 2 (two) times daily. 180 capsule 1   GVOKE HYPOPEN 2-PACK 1 MG/0.2ML SOAJ Inject 1 mg into the skin as needed (hypoglycemia). 1 mL  2   hydrALAZINE (APRESOLINE) 100 MG tablet TAKE 1 TABLET BY MOUTH TWICE A DAY 180 tablet 1   Insulin Pen Needle (B-D ULTRAFINE III SHORT PEN) 31G X 8 MM MISC USE TO INJECT INSULIN NIGHTLY 100 each 6   isosorbide mononitrate (IMDUR) 60 MG 24 hr tablet Take 1 tablet (60 mg total) by mouth 2 (two) times daily. (Patient not taking: Reported on 09/23/2023) 180 tablet 2   Lancets Misc. MISC Use  Brand compatable to insurance and monitor to check blood sugar up to 3 times daily. ICD10 E11.9 100 each 12   LANTUS SOLOSTAR 100 UNIT/ML Solostar Pen INJECT 40 UNITS INTO THE SKIN AT BEDTIME. (Patient taking differently: Inject 17 Units into the skin at bedtime.) 45 mL 1   valsartan (DIOVAN) 40 MG tablet Take 1 tablet (40 mg total) by mouth daily. 90 tablet 2   valsartan (DIOVAN) 80 MG  tablet Take 0.5 tablets (40 mg total) by mouth daily.     No current facility-administered medications for this visit.     Physical Exam    VS:  There were no vitals taken for this visit. , BMI There is no height or weight on file to calculate BMI.       GEN: Well nourished, well developed, in no acute distress. HEENT: normal. Neck: Supple, no JVD, carotid bruits, or masses. Cardiac: RRR, no murmurs, rubs, or gallops. No clubbing, cyanosis, edema.  Radials 2+/PT 2+ and equal bilaterally.  Respiratory:  Respirations regular and unlabored, clear to auscultation bilaterally. GI: Soft, nontender, nondistended, BS + x 4. MS: no deformity or atrophy. Skin: warm and dry, no rash. Neuro:  Strength and sensation are intact. Psych: Normal affect.  Accessory Clinical Findings    ECG personally reviewed by me today -    *** - no acute changes.  Lab Results  Component Value Date   WBC 4.5 02/22/2023   HGB 9.8 (L) 02/22/2023   HCT 28.5 (L) 02/22/2023   MCV 100.0 02/22/2023   PLT 166 02/22/2023   Lab Results  Component Value Date   CREATININE 3.01 (H) 09/23/2023   BUN 63 (H) 09/23/2023   NA 135 09/23/2023   K 5.7 (H) 09/23/2023   CL 102 09/23/2023   CO2 19 (L) 09/23/2023   Lab Results  Component Value Date   ALT 21 02/19/2023   AST 21 02/19/2023   ALKPHOS 50 02/19/2023   BILITOT 0.6 02/19/2023   Lab Results  Component Value Date   CHOL 171 09/23/2023   HDL 44 09/23/2023   LDLCALC 91 09/23/2023   LDLDIRECT 74 03/16/2021   TRIG 211 (H) 09/23/2023   CHOLHDL 3.9 09/23/2023    Lab Results  Component Value Date   HGBA1C 8.6 (H) 01/30/2023   Lab Results  Component Value Date   TSH 1.57 01/30/2023       Assessment & Plan    1.  ***  Nicolasa Ducking, NP 10/23/2023, 8:06 AM

## 2023-10-30 DIAGNOSIS — Z872 Personal history of diseases of the skin and subcutaneous tissue: Secondary | ICD-10-CM | POA: Diagnosis not present

## 2023-10-30 DIAGNOSIS — L853 Xerosis cutis: Secondary | ICD-10-CM | POA: Diagnosis not present

## 2023-10-30 DIAGNOSIS — Z89422 Acquired absence of other left toe(s): Secondary | ICD-10-CM | POA: Diagnosis not present

## 2023-10-30 DIAGNOSIS — M216X1 Other acquired deformities of right foot: Secondary | ICD-10-CM | POA: Diagnosis not present

## 2023-10-30 DIAGNOSIS — E1142 Type 2 diabetes mellitus with diabetic polyneuropathy: Secondary | ICD-10-CM | POA: Diagnosis not present

## 2023-10-30 DIAGNOSIS — L84 Corns and callosities: Secondary | ICD-10-CM | POA: Diagnosis not present

## 2023-11-09 ENCOUNTER — Other Ambulatory Visit: Payer: Self-pay | Admitting: Family

## 2023-11-17 DIAGNOSIS — E1151 Type 2 diabetes mellitus with diabetic peripheral angiopathy without gangrene: Secondary | ICD-10-CM | POA: Diagnosis not present

## 2023-11-17 DIAGNOSIS — I110 Hypertensive heart disease with heart failure: Secondary | ICD-10-CM | POA: Diagnosis not present

## 2023-11-17 DIAGNOSIS — Z7982 Long term (current) use of aspirin: Secondary | ICD-10-CM | POA: Diagnosis not present

## 2023-11-17 DIAGNOSIS — Z79899 Other long term (current) drug therapy: Secondary | ICD-10-CM | POA: Diagnosis not present

## 2023-11-17 DIAGNOSIS — E11621 Type 2 diabetes mellitus with foot ulcer: Secondary | ICD-10-CM | POA: Diagnosis not present

## 2023-11-17 DIAGNOSIS — L97509 Non-pressure chronic ulcer of other part of unspecified foot with unspecified severity: Secondary | ICD-10-CM | POA: Diagnosis not present

## 2023-11-17 DIAGNOSIS — Z794 Long term (current) use of insulin: Secondary | ICD-10-CM | POA: Diagnosis not present

## 2023-11-17 DIAGNOSIS — I2510 Atherosclerotic heart disease of native coronary artery without angina pectoris: Secondary | ICD-10-CM | POA: Diagnosis not present

## 2023-11-17 DIAGNOSIS — E1140 Type 2 diabetes mellitus with diabetic neuropathy, unspecified: Secondary | ICD-10-CM | POA: Diagnosis not present

## 2023-11-17 DIAGNOSIS — I509 Heart failure, unspecified: Secondary | ICD-10-CM | POA: Diagnosis not present

## 2023-11-17 DIAGNOSIS — E785 Hyperlipidemia, unspecified: Secondary | ICD-10-CM | POA: Diagnosis not present

## 2023-12-02 ENCOUNTER — Encounter (INDEPENDENT_AMBULATORY_CARE_PROVIDER_SITE_OTHER): Payer: Self-pay

## 2023-12-25 ENCOUNTER — Ambulatory Visit: Attending: Cardiology | Admitting: Cardiology

## 2023-12-25 ENCOUNTER — Encounter: Payer: Self-pay | Admitting: Cardiology

## 2023-12-25 VITALS — BP 120/75 | HR 66 | Ht 68.0 in | Wt 188.2 lb

## 2023-12-25 DIAGNOSIS — N184 Chronic kidney disease, stage 4 (severe): Secondary | ICD-10-CM | POA: Diagnosis not present

## 2023-12-25 DIAGNOSIS — E785 Hyperlipidemia, unspecified: Secondary | ICD-10-CM | POA: Insufficient documentation

## 2023-12-25 DIAGNOSIS — I2510 Atherosclerotic heart disease of native coronary artery without angina pectoris: Secondary | ICD-10-CM | POA: Diagnosis not present

## 2023-12-25 DIAGNOSIS — E1142 Type 2 diabetes mellitus with diabetic polyneuropathy: Secondary | ICD-10-CM | POA: Insufficient documentation

## 2023-12-25 DIAGNOSIS — I5032 Chronic diastolic (congestive) heart failure: Secondary | ICD-10-CM | POA: Insufficient documentation

## 2023-12-25 DIAGNOSIS — E1169 Type 2 diabetes mellitus with other specified complication: Secondary | ICD-10-CM | POA: Insufficient documentation

## 2023-12-25 DIAGNOSIS — I10 Essential (primary) hypertension: Secondary | ICD-10-CM | POA: Diagnosis not present

## 2023-12-25 MED ORDER — ISOSORBIDE MONONITRATE ER 60 MG PO TB24: 60.0000 mg | ORAL_TABLET | Freq: Two times a day (BID) | ORAL | 2 refills | Status: AC

## 2023-12-25 MED ORDER — CARVEDILOL 6.25 MG PO TABS: 6.2500 mg | ORAL_TABLET | Freq: Two times a day (BID) | ORAL | 3 refills | Status: DC

## 2023-12-25 MED ORDER — ATORVASTATIN CALCIUM 40 MG PO TABS: 40.0000 mg | ORAL_TABLET | Freq: Every day | ORAL | 2 refills | Status: AC

## 2023-12-25 MED ORDER — FUROSEMIDE 40 MG PO TABS: 40.0000 mg | ORAL_TABLET | Freq: Every day | ORAL | 5 refills | Status: DC

## 2023-12-25 NOTE — Progress Notes (Signed)
 Cardiology Office Note   Date:  12/25/2023  ID:  [name], DOB 04-18-1961, MRN [id number] PCP: Raina Bunting, DO  [facility] Providers Cardiologist:  Antionette Kirks, MD     History of Present Illness [name] is a 63 y.o. male with a past medical history of coronary disease, chronic systolic heart failure, type 2 diabetes, hypertension, CKD stage IV, EtOH abuse, PAD, who is being seen today for follow-up.   [name] has been previously followed by advanced heart failure team.  He is status post PCI/DES to the LAD in 10/2016 in the setting of acute heart failure.  At that time he was found to have severe LV dysfunction with an EF of 25-30% which was found to be out of proportion to his LAD disease.  He also has a history of alcohol abuse.  Follow-up echocardiogram in 2018 showed an EF of 40-45%.  Echocardiogram in 2022 showed EF 45-50%.  There was concern for possible cardiac amyloidosis.  PYP scan was equivocal for amyloidosis.  Echo January 2024 showed EF 55 to 60%.  [name] was not covered by insurance and Jardiance  was stopped due to rash.  He remains on disability.  He was seen in clinic June 2024 on Lasix  40 mg daily.  He was started on Entresto and spironolactone due to CKD stage IV.  He was on hydralazine  and Imdur .  Labs, PFTs and sleep study were ordered and recommended.  He was hospitalized at [facility] 02/19/2023 - 02/23/2023 for acute on chronic heart failure.  Echocardiogram revealed LVEF 50-55%, no RWMA, mild LVH, G2 DD, IV diuretics was held pending nephrology assessment once resume continued on furosemide  40 mg daily, serum creatinine was back down to 3.7.  He was treated for an infection to his right foot.   Admitted 06/05/2023 for right metatarsal diabetic foot ulcer status postdebridement on the plantar aspect 05/2021.  Full-thickness ulcer at 0.  Started on broad-spectrum antibiotics with Flagyl, cefepime, and linezolid.  Culture from I&D with  MRSA.  Contact precautions were initiated.  IV antibiotics were transitioned to oral meds.  Sodium bicarb was started for acidosis correction and was discontinued at discharge.   He was last seen in clinic 03/13/2023 by Dr.Arida.  He had continued to suffer from intermittent dizziness with med changes made by heart failure clinic last week.  Med changes were to stop losartan , start valsartan  and take hydralazine  twice daily.  Orthostatic blood pressures were obtained during his appointment.  Amlodipine  previously been discontinued due to leg edema.  He was found to be orthostatic when he was seen.  He was being weaned off of clonidine .  He was last evaluated by advanced heart failure clinic with Shawnee Dellen, NP on 09/23/2023.  At that time unfortunately he had been out of several of his medications for approximately 1 week and needed new prescriptions.  He was continued on carvedilol , furosemide , hydralazine , Imdur , and valsartan .  Sleep study was done 01/20/2023 which revealed severe sleep apnea with an REI of 46.5/hour continues to be noncompliant with CPAP.  No other medication changes were made and and he was sent for labs.  He returns to clinic today stating that he has been doing well from a cardiac perspective.  He denies any chest pain, shortness of breath, peripheral edema.  There is some confusion of his medications and he brought all of his pills with him today as he needs several refills.  He had previously followed up with advanced heart  failure clinic and was back on his Coreg  dosing of medicines.  Some of his medicines he continues to split and have them take half doses throughout the day.  He denies any recent hospitalizations or visits to the emergency department.  ROS: 10 point review of systems has been reviewed and considered negative except ones been listed in HPI  Studies Reviewed EKG Interpretation Date/Time:  Thursday December 25 2023 10:05:59 EDT Ventricular Rate:  66 PR  Interval:  184 QRS Duration:  150 QT Interval:  456 QTC Calculation: 478 R Axis:   -41  Text Interpretation: Normal sinus rhythm Possible Left atrial enlargement Left axis deviation Left bundle branch block When compared with ECG of 13-Mar-2023 16:15, No significant change was found Confirmed by Ronald Cockayne ([id number]) on 12/25/2023 10:09:17 AM    2D echo 02/19/2023 1. Left ventricular ejection fraction, by estimation, is 50 to 55%. The  left ventricle has low normal function. The left ventricle has no regional  wall motion abnormalities. There is mild left ventricular hypertrophy.  Left ventricular diastolic  parameters are consistent with Grade II diastolic dysfunction  (pseudonormalization). Elevated left atrial pressure.   2. Right ventricular systolic function is normal. The right ventricular  size is mildly enlarged. Tricuspid regurgitation signal is inadequate for  assessing PA pressure.   3. Left atrial size was mildly dilated.   4. The mitral valve is degenerative. Mild to moderate mitral valve  regurgitation. No evidence of mitral stenosis.   5. The aortic valve is tricuspid. Aortic valve regurgitation is not  visualized. No aortic stenosis is present.   6. There is borderline dilatation of the ascending aorta, measuring 38  mm.   7. The inferior vena cava is normal in size with greater than 50%  respiratory variability, suggesting right atrial pressure of 3 mmHg.   Echo 07/2022  1. Left ventricular ejection fraction, by estimation, is 55 to 60%. The  left ventricle has normal function. The left ventricle has no regional  wall motion abnormalities. There is moderate concentric left ventricular  hypertrophy. Left ventricular  diastolic parameters are consistent with Grade II diastolic dysfunction  (pseudonormalization).   2. Right ventricular systolic function is normal. The right ventricular  size is normal. There is normal pulmonary artery systolic pressure. The  estimated  right ventricular systolic pressure is 12.4 mmHg.   3. Left atrial size was mildly dilated.   4. The mitral valve is normal in structure. Mild mitral valve  regurgitation. No evidence of mitral stenosis. Moderate mitral annular  calcification.   5. The aortic valve is normal in structure. Aortic valve regurgitation is  not visualized. Aortic valve sclerosis is present, with no evidence of  aortic valve stenosis.   6. The inferior vena cava is normal in size with greater than 50%  respiratory variability, suggesting right atrial pressure of 3 mmHg.    Myocardial amyloid imaging 2022 Findings equivocal for ATTR cardiac amyloid with H/CL ratio 1.1 and grade 2 tracer uptake.   Echo 08/2020  1. Left ventricular ejection fraction, by estimation, is 45 to 50%. Left  ventricular ejection fraction by 3D volume is 47 %. The left ventricle has  mildly decreased function. The left ventricle demonstrates global  hypokinesis. There is moderate  asymmetric left ventricular hypertrophy of the septal segment. Left  ventricular diastolic parameters are consistent with Grade I diastolic  dysfunction (impaired relaxation). The average left ventricular global  longitudinal strain is -10.7 %. The global  longitudinal strain is abnormal.  2. Right ventricular systolic function is low normal. The right  ventricular size is normal.   3. The mitral valve is normal in structure. No evidence of mitral valve  regurgitation.   4. The aortic valve is tricuspid. Aortic valve regurgitation is not  visualized. Mild aortic valve sclerosis is present, with no evidence of  aortic valve stenosis.   5. The inferior vena cava is dilated in size with >50% respiratory  variability, suggesting right atrial pressure of 8 mmHg.    Echo 08/2017 Study Conclusions  - Left ventricle: The cavity size was normal. There was mild    concentric hypertrophy. Systolic function was mildly to    moderately reduced. The estimated  ejection fraction was in the    range of 40% to 45%. Hypokinesis of the septal, anteroseptal    myocardium, possibly secondary to conduction abnormality.    Hypokinesis of the anterior myocardium. Doppler parameters are    consistent with abnormal left ventricular relaxation (grade 1    diastolic dysfunction).  - Mitral valve: There was mild regurgitation.  - Left atrium: The atrium was mildly dilated.  - Right ventricle: Systolic function was normal.  - Pulmonary arteries: Systolic pressure could not be accurately    estimated.   Impressions:  - Comparwed to prior study 2018, function and regions of wall    motion are unchanged.    LHC 10/2016 Ost 2nd Diag to 2nd Diag lesion, 60 %stenosed. Mid Cx lesion, 50 %stenosed. Prox RCA lesion, 40 %stenosed. Mid RCA lesion, 20 %stenosed. A STENT XIENCE ALPINE RX 3.5X15 drug eluting stent was successfully placed. Mid LAD lesion, 90 %stenosed. Post intervention, there is a 0% residual stenosis.   1. Severe one-vessel coronary artery disease involving mid LAD. 2. Severely reduced LV systolic function by echo. Left ventricular angiography was not performed. LVEDP was only mildly elevated at 15 mmHg. 3. Successful angioplasty and drug-eluting stent placement to the mid LAD.   Recommendations: Dual antiplatelet therapy for at least one year. Continue treatment for systolic heart failure. Resume furosemide  40 mg once daily tomorrow morning. The patient can likely be discharged home tomorrow if no complications.   Echo 2018 Study Conclusions  - Procedure narrative: Transthoracic echocardiography. Image    quality was poor. The study was technically difficult, as a    result of poor acoustic windows and poor sound wave transmission.    Intravenous contrast (Definity ) was administered.  - Left ventricle: The cavity size was mildly dilated. Systolic    function was severely reduced. The estimated ejection fraction    was in the range of 25% to  30%. Diffuse hypokinesis.  - Mitral valve: There was mild regurgitation.  - Left atrium: The atrium was mildly dilated.  Risk Assessment/Calculations           Physical Exam VS:  BP 120/75 (BP Location: Left Arm, Patient Position: Sitting, Cuff Size: Normal)   Pulse 66   Ht 5' 8 (1.727 m)   Wt 188 lb 3.2 oz (85.4 kg)   SpO2 96%   BMI 28.62 kg/m    Wt Readings from Last 3 Encounters:  12/25/23 188 lb 3.2 oz (85.4 kg)  09/23/23 189 lb (85.7 kg)  06/17/23 192 lb (87.1 kg)    GEN: Well nourished, well developed in no acute distress NECK: No JVD; No carotid bruits CARDIAC: RRR, no murmurs, rubs, gallops RESPIRATORY:  Clear to auscultation without rales, wheezing or rhonchi  ABDOMEN: Soft, non-tender, non-distended EXTREMITIES:  No edema; No  deformity   ASSESSMENT AND PLAN Coronary artery disease status post LAD stenting in April 2018.  He has done well without any recurrent chest pain or dyspnea.  EKG today reveals sinus rhythm with a rate of 66 with left atrial enlargement and a chronic left bundle branch block that is unchanged.  He is continued on aspirin  81 mg daily indefinitely he is also continued on atorvastatin  40 mg daily.  No further ischemic workup needed at this time.  Chronic HFimpEF reports most recent echocardiogram in 02/2023 revealed LVEF 50-55%, no RWMA, mild LVH, G2 DD.  Treatment of heart failure is limited by his advanced chronic kidney disease with CKD stage IV.  He seems to be tolerating valsartan  and furosemide  this time.  He has also been maintained on hydralazine  and Imdur .  He appears to be euvolemic on exam.  Continues to suffer from NYHA a class II symptoms.  He has been encouraged to keep all follow-up appointments with advanced heart failure clinic.  Primary hypertension with blood pressure today 120/75.  He has been continued on carvedilol  6.25 mg twice daily, furosemide  40 mg daily, hydralazine  100 mg twice daily, Imdur  60 mg twice daily, and Diovan  80 mg  daily.  He has been encouraged to continue to monitor his blood pressures 1 to 2 hours postmedication administration as well.  Mixed hyperlipidemia with his last LDL trending upwards of 91.  Patient stated previously he had ran out of his atorvastatin  and been off cholesterol medicine.  He is back on his atorvastatin  at this time will need repeat lipid and hepatic panel in 3 months or when he follows up with his PCP for his a yearly appointment.  Type 2 diabetes with hemoglobin A1c of 7.9.  He has been continued on insulin .  Ongoing management per his PCP.  Stage IV CKD with his last serum creatinine 2.73.  Continue with close follow-up with nephrology with Dr. Erminio Hazy.       Dispo: Patient returns to clinic with MD/APP in 6 months or sooner if needed for further evaluation.  Signed, [name] Grandison, NP

## 2023-12-25 NOTE — Patient Instructions (Signed)
 Medication Instructions:  Your physician recommends that you continue on your current medications as directed. Please refer to the Current Medication list given to you today.   *If you need a refill on your cardiac medications before your next appointment, please call your pharmacy*  Lab Work: No labs ordered today  If you have labs (blood work) drawn today and your tests are completely normal, you will receive your results only by: MyChart Message (if you have MyChart) OR A paper copy in the mail If you have any lab test that is abnormal or we need to change your treatment, we will call you to review the results.  Testing/Procedures: No test ordered today   Follow-Up: At [facility], you and your health needs are our priority.  As part of our continuing mission to provide you with exceptional heart care, our providers are all part of one team.  This team includes your primary Cardiologist (physician) and Advanced Practice Providers or APPs (Physician Assistants and Nurse Practitioners) who all work together to provide you with the care you need, when you need it.  Your next appointment:   6 month(s)  Provider:   Antionette Kirks, MD or Ronald Cockayne, NP

## 2024-01-09 ENCOUNTER — Other Ambulatory Visit: Payer: Self-pay | Admitting: Family Medicine

## 2024-01-09 DIAGNOSIS — E1142 Type 2 diabetes mellitus with diabetic polyneuropathy: Secondary | ICD-10-CM

## 2024-01-12 NOTE — Telephone Encounter (Signed)
 Requested Prescriptions  Pending Prescriptions Disp Refills   gabapentin  (NEURONTIN ) 100 MG capsule [Pharmacy Med Name: GABAPENTIN  100 MG CAPSULE] 180 capsule 0    Sig: TAKE 1 CAPSULE BY MOUTH TWICE A DAY     Neurology: Anticonvulsants - gabapentin  Failed - 01/12/2024  2:08 PM      Failed - Cr in normal range and within 360 days    Creat  Date Value Ref Range Status  01/30/2023 2.77 (H) 0.70 - 1.35 mg/dL Final   Creatinine, Ser  Date Value Ref Range Status  09/23/2023 3.01 (H) 0.76 - 1.27 mg/dL Final   Creatinine, POC  Date Value Ref Range Status  05/15/2017 0 mg/dL Final   Creatinine, Urine  Date Value Ref Range Status  07/24/2022 25 20 - 320 mg/dL Final         Failed - Valid encounter within last 12 months    Recent Outpatient Visits   None            Passed - Completed PHQ-2 or PHQ-9 in the last 360 days

## 2024-01-22 ENCOUNTER — Ambulatory Visit
Admission: RE | Admit: 2024-01-22 | Discharge: 2024-01-22 | Disposition: A | Discharge status: Home / Self Care | Source: Ambulatory Visit | Attending: Family Medicine | Admitting: Family Medicine

## 2024-01-22 ENCOUNTER — Ambulatory Visit: Admitting: Family Medicine

## 2024-01-22 ENCOUNTER — Ambulatory Visit: Payer: Self-pay | Admitting: Family Medicine

## 2024-01-22 ENCOUNTER — Encounter: Payer: Self-pay | Admitting: Family Medicine

## 2024-01-22 VITALS — BP 134/60 | HR 65 | Ht 68.0 in | Wt 188.8 lb

## 2024-01-22 DIAGNOSIS — R600 Localized edema: Secondary | ICD-10-CM | POA: Diagnosis not present

## 2024-01-22 DIAGNOSIS — L03115 Cellulitis of right lower limb: Secondary | ICD-10-CM

## 2024-01-22 DIAGNOSIS — L97512 Non-pressure chronic ulcer of other part of right foot with fat layer exposed: Secondary | ICD-10-CM | POA: Diagnosis not present

## 2024-01-22 DIAGNOSIS — M25512 Pain in left shoulder: Secondary | ICD-10-CM | POA: Insufficient documentation

## 2024-01-22 DIAGNOSIS — E1142 Type 2 diabetes mellitus with diabetic polyneuropathy: Secondary | ICD-10-CM | POA: Diagnosis not present

## 2024-01-22 DIAGNOSIS — G8929 Other chronic pain: Secondary | ICD-10-CM

## 2024-01-22 DIAGNOSIS — E11621 Type 2 diabetes mellitus with foot ulcer: Secondary | ICD-10-CM | POA: Insufficient documentation

## 2024-01-22 DIAGNOSIS — Z89411 Acquired absence of right great toe: Secondary | ICD-10-CM | POA: Diagnosis not present

## 2024-01-22 DIAGNOSIS — M79604 Pain in right leg: Secondary | ICD-10-CM

## 2024-01-22 DIAGNOSIS — M19071 Primary osteoarthritis, right ankle and foot: Secondary | ICD-10-CM | POA: Diagnosis not present

## 2024-01-22 DIAGNOSIS — Z89421 Acquired absence of other right toe(s): Secondary | ICD-10-CM | POA: Diagnosis not present

## 2024-01-22 MED ORDER — OXYCODONE-ACETAMINOPHEN 5-325 MG PO TABS: 1.0000 | ORAL_TABLET | ORAL | 0 refills | Status: AC | PRN

## 2024-01-22 MED ORDER — DOXYCYCLINE HYCLATE 100 MG PO TABS: 100.0000 mg | ORAL_TABLET | Freq: Two times a day (BID) | ORAL | 0 refills | Status: DC

## 2024-01-22 NOTE — Patient Instructions (Addendum)
 Thank you for coming to the office today.  Start taking Doxycycline  antibiotic 100mg  twice daily for 10 days. Take with full glass of water and stay upright for at least 30 min after taking, may be seated or standing, but should NOT lay down. This is just a safety precaution, if this medicine does not go all the way down throat well it could cause some burning discomfort to throat and esophagus. Also NOTE - do not take medicine within 2 hours (before or after) consuming dairy or foods / vitamins containing high calcium  or iron.  ------------  Elevate the foot to reduce swelling  X-ray today  Maybe CT scan  Call the Foot Doctor to follow-up  Georgiann Kayla Birmingham, DPM  [address]  [id number] [facility]  [place], [place] [address]  Phone: [phone]    Pain medicine ordered, take as needed  Maybe need to do an injection inLeft shoulder when the foot heals  Consider Pain Management referral   Please schedule a Follow-up Appointment to: Return in about 4 weeks (around 02/19/2024) for 4 weeks follow up L Shoulder / Foot Ulcer.  If you have any other questions or concerns, please feel free to call the office or send a message through MyChart. You may also schedule an earlier appointment if necessary.  Additionally, you may be receiving a survey about your experience at our office within a few days to 1 week by e-mail or mail. We value your feedback.  Marsa Officer, DO [facility], [place]

## 2024-01-22 NOTE — Progress Notes (Signed)
 Subjective:    Patient ID: [name], male    DOB: 1961-05-14, 63 y.o.   MRN: [id number]  [name] is a 63 y.o. male presenting on 01/22/2024 for Leg Pain (Right leg pain, pain is from feet-knee. Couple of weeks.  Also has left shoulder pain. Has been going on for a while. )   HPI  Discussed the use of AI scribe software for clinical note transcription with the patient, who gave verbal consent to proceed.  History of Present Illness   [name] is a 63 year old male with neuropathy who presents with foot pain and swelling.  R Foot pain, swelling, and ulceration - Pain and swelling in the foot for approximately two days - Redness and swelling localized to the top of the foot - Chronic stable ulceration wound on dorsal surface, has been managed by podiatry, wound care and vascular. It is stable. - Pain is localized but the area is not tender to palpation - No fever, chills, or other systemic symptoms - History of podiatry and wound care follow-up, last seen three to four months ago with prior wound healing well - Not currently taking antibiotics; unable to take Bactrim but has previously tolerated doxycycline   Peripheral neuropathy and sensory loss - Chronic neuropathy resulting in loss of sensation in the feet - History of prior foot surgeries with hardware placement  Chronic left shoulder pain and limited range of motion - Chronic left shoulder pain with limited range of motion - Difficulty lifting heavy objects due to pain - No recent use of specific pain medication except for Percocet taken three days ago, which provided relief        02/28/2023    2:05 PM 07/24/2022    8:38 AM 01/21/2022   12:47 PM  Depression screen PHQ 2/9  Decreased Interest 0 1 0  Down, Depressed, Hopeless 0 0 0  PHQ - 2 Score 0 1 0  Altered sleeping 0 2   Tired, decreased energy 0 2   Change in appetite 0 0   Feeling bad or failure about yourself  0 0    Trouble concentrating 0 0   Moving slowly or fidgety/restless 0 0   Suicidal thoughts 0 0   PHQ-9 Score 0 5   Difficult doing work/chores Not difficult at all Not difficult at all        02/28/2023    2:05 PM 07/24/2022    8:39 AM 01/04/2021    8:11 AM  GAD 7 : Generalized Anxiety Score  Nervous, Anxious, on Edge 0 0 0  Control/stop worrying 0 1 0  Worry too much - different things 0 0 0  Trouble relaxing 0 1 0  Restless 0 0 0  Easily annoyed or irritable 0 0 0  Afraid - awful might happen 0 0 0  Total GAD 7 Score 0 2 0  Anxiety Difficulty Not difficult at all Not difficult at all Not difficult at all    Social History   Tobacco Use   Smoking status: Never   Smokeless tobacco: Never  Vaping Use   Vaping status: Never Used  Substance Use Topics   Alcohol use: Yes    Comment: occassionally   Drug use: No    Review of Systems Per HPI unless specifically indicated above     Objective:    BP 134/60 (BP Location: Right Arm, Patient Position: Sitting, Cuff Size: Normal)   Pulse 65   Ht  5' 8 (1.727 m)   Wt 188 lb 12.8 oz (85.6 kg)   SpO2 98%   BMI 28.71 kg/m   Wt Readings from Last 3 Encounters:  01/22/24 188 lb 12.8 oz (85.6 kg)  12/25/23 188 lb 3.2 oz (85.4 kg)  09/23/23 189 lb (85.7 kg)    Physical Exam Vitals and nursing note reviewed.  Constitutional:      General: He is not in acute distress.    Appearance: Normal appearance. He is well-developed. He is not diaphoretic.     Comments: Well-appearing, comfortable, cooperative  HENT:     Head: Normocephalic and atraumatic.  Eyes:     General:        Right eye: No discharge.        Left eye: No discharge.     Conjunctiva/sclera: Conjunctivae normal.  Cardiovascular:     Rate and Rhythm: Normal rate.  Pulmonary:     Effort: Pulmonary effort is normal.  Musculoskeletal:     Comments: L Shoulder reduced range of motion forward flex and abduction. Due to pain  Skin:    General: Skin is warm and dry.      Findings: Lesion (see photos, R foot with plantar surface callus with chronic stable wound. no erythema or drainage. On dorsal surface has erythema and warmth and some streaking) present. No erythema or rash.  Neurological:     Mental Status: He is alert and oriented to person, place, and time.  Psychiatric:        Mood and Affect: Mood normal.        Behavior: Behavior normal.        Thought Content: Thought content normal.     Comments: Well groomed, good eye contact, normal speech and thoughts     R Foot       I have personally reviewed the radiology report from 01/22/24 on X-ray R FOOT.  Narrative & Impression  CLINICAL DATA:  Erythema and swelling in the right foot, chronic ulcer   EXAM: RIGHT FOOT COMPLETE - 3+ VIEW   COMPARISON:  03/05/2023   FINDINGS: No new bony destructive findings to suggest osteomyelitis. 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 Mild hyperextension of the second through fourth toes.   Along the plantar ball of the foot, there appears to be an ulceration along with a new 3 mm radiodense foreign body, best appreciated on the lateral projection.   Progressive subcutaneous edema dorsally along the foot and along the plantar ball of the foot. Cellulitis not excluded.   Distal tibial IM nail. Vascular calcifications. Degenerative findings along the subtalar joints.   IMPRESSION: 1. No new bony destructive findings to suggest osteomyelitis. 2. Progressive subcutaneous edema dorsally along the foot and along the plantar ball of the foot. Cellulitis not excluded. 3. New 3 mm radiodense foreign body along the plantar ball of the foot, along a likely ulcer crater. 4. 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 5. Vascular calcifications.     Electronically Signed   By: Ryan Salvage M.D.   On: 01/22/2024 10:11    I have personally  reviewed the radiology report from 01/22/24 on L SHOULDER.  CLINICAL DATA:  Acute on chronic left shoulder pain and limited range of motion   EXAM: LEFT SHOULDER - 2+ VIEW   COMPARISON:  None Available.   FINDINGS: Chondrocalcinosis versus small fragmented spur along the upper portion  of the [facility] joint. Subacromial morphology is type 2 (curved).   Mild spurring of the inferior glenoid.   No malalignment.  No fracture or acute bony findings.   IMPRESSION: 1. Mild spurring of the inferior glenoid. 2. Chondrocalcinosis versus small fragmented spur along the upper portion of the [facility] joint.     Electronically Signed   By: Ryan Salvage M.D.   On: 01/22/2024 10:12   Results for orders placed or performed in visit on 01/22/24  CBC with Differential/Platelet   Collection Time: 01/22/24  9:58 AM  Result Value Ref Range   WBC 15.8 (H) 3.8 - 10.8 Thousand/uL   RBC 3.55 (L) 4.20 - 5.80 Million/uL   Hemoglobin 11.7 (L) 13.2 - 17.1 g/dL   HCT 64.6 (L) 61.4 - 49.9 %   MCV 99.4 80.0 - 100.0 fL   MCH 33.0 27.0 - 33.0 pg   MCHC 33.1 32.0 - 36.0 g/dL   RDW 88.0 88.9 - 84.9 %   Platelets 188 140 - 400 Thousand/uL   MPV 10.5 7.5 - 12.5 fL   Neutro Abs 13,936 (H) 1,500 - 7,800 cells/uL   Absolute Lymphocytes 632 (L) 850 - 3,900 cells/uL   Absolute Monocytes 1,201 (H) 200 - 950 cells/uL   Eosinophils Absolute 16 15 - 500 cells/uL   Basophils Absolute 16 0 - 200 cells/uL   Neutrophils Relative % 88.2 %   Total Lymphocyte 4.0 %   Monocytes Relative 7.6 %   Eosinophils Relative 0.1 %   Basophils Relative 0.1 %  Sed Rate (ESR)   Collection Time: 01/22/24  9:58 AM  Result Value Ref Range   Sed Rate 91 (H) 0 - 20 mm/h  Basic Metabolic Panel Without GFR   Collection Time: 01/22/24  9:58 AM  Result Value Ref Range   Glucose, Bld 249 (H) 65 - 99 mg/dL   BUN 73 (H) 7 - 25 mg/dL   Creat 5.95 (H) 9.29 - 1.35 mg/dL   BUN/Creatinine Ratio 18 6 - 22 (calc)   Sodium 129 (L) 135 - 146 mmol/L    Potassium 4.0 3.5 - 5.3 mmol/L   Chloride 94 (L) 98 - 110 mmol/L   CO2 23 20 - 32 mmol/L   Calcium  8.3 (L) 8.6 - 10.3 mg/dL      Assessment & Plan:   Problem List Items Addressed This Visit     DM type 2 with diabetic peripheral neuropathy (HCC)   Other Visit Diagnoses       Cellulitis of right lower extremity    -  Primary     Diabetic ulcer of toe of right foot associated with type 2 diabetes mellitus, with fat layer exposed (HCC)       Relevant Medications   doxycycline  (VIBRA -TABS) 100 MG tablet   oxyCODONE -acetaminophen  (PERCOCET/ROXICET) 5-325 MG tablet   Other Relevant Orders   DG Foot Complete Right (Completed)   CBC with Differential/Platelet (Completed)   Sed Rate (ESR) (Completed)   C-reactive protein   Basic Metabolic Panel Without GFR (Completed)     Chronic pain of right lower extremity       Relevant Medications   oxyCODONE -acetaminophen  (PERCOCET/ROXICET) 5-325 MG tablet     Chronic left shoulder pain       Relevant Medications   oxyCODONE -acetaminophen  (PERCOCET/ROXICET) 5-325 MG tablet   Other Relevant Orders   DG Shoulder Left (Completed)         Cellulitis of the foot Acute cellulitis with redness, swelling, and ulceration. Possible underlying  infection. Prior surgery with hardware in the area. No allergies to doxycycline , but cannot take Bactrim. - Order foot x-ray to assess for deeper infection. - Prescribe doxycycline  100 mg twice daily for 10 days. - Advise elevation of the foot to reduce swelling. - Instruct to avoid lying down immediately after taking doxycycline  to prevent esophageal irritation. - Advise to avoid taking doxycycline  with food, iron, calcium , or vitamins. - Instruct to monitor for worsening symptoms such as spreading erythema, fever, or chills and seek hospital care if these occur. - Contact podiatrist for follow-up and further evaluation.  Neuropathy Loss of sensation in the feet consistent with neuropathy, contributing to  overall foot health issues.  Chronic left shoulder pain Chronic pain with limited range of motion, likely due to rotator cuff pathology. Pain exacerbated by lifting. Discussed possibility of corticosteroid injection after resolution of foot infection.  - Order shoulder x-ray to assess for structural abnormalities.  Given both acute R foot issue and L Shoulder limiting his function will manage his pain now, his specialist team suggested pain management, I offered this but this would not be suitable today for acute pain management - Prescribe Percocet for pain management, 30 pills, to be taken as needed. - Consider referral to pain management specialist. - Discuss potential for corticosteroid injection in the shoulder after resolution of foot infection.      X-rays updated with R foot showing cellulitis likely given appearance and no sign of osteomyelitis.  L Shoulder with osteoarthritis likely rotator cuff impingement.  Labs resulting as well with elevated ESR 91 and WBC 15 Na 129, Cr 4.0 he has had acute on chronic kidney disease with acute evidence of infection on labs. Pending CRP - we will contact him back again to advise him to seek care at hospital ED if any sign of worsening or lack of improvement in next 24-48 hours.   Orders Placed This Encounter  Procedures   DG Foot Complete Right    Standing Status:   Future    Number of Occurrences:   1    Expiration Date:   01/21/2025    Reason for Exam (SYMPTOM  OR DIAGNOSIS REQUIRED):   chronic R foot diabetic ulcer, now has redness swelling    Preferred imaging location?:   [facility] BARE Shoulder Left    Standing Status:   Future    Number of Occurrences:   1    Expiration Date:   04/23/2024    Reason for Exam (SYMPTOM  OR DIAGNOSIS REQUIRED):   worsening acute on chronic left shoulder pain, limited range of motion    Preferred imaging location?:   [facility]   CBC with Differential/Platelet   Sed Rate (ESR)   C-reactive  protein   Basic Metabolic Panel Without GFR    Meds ordered this encounter  Medications   doxycycline  (VIBRA -TABS) 100 MG tablet    Sig: Take 1 tablet (100 mg total) by mouth 2 (two) times daily. For 10 days. Take with full glass of water, stay upright 30 min after taking.    Dispense:  20 tablet    Refill:  0   oxyCODONE -acetaminophen  (PERCOCET/ROXICET) 5-325 MG tablet    Sig: Take 1 tablet by mouth every 4 (four) hours as needed for up to 5 days for severe pain (pain score 7-10).    Dispense:  30 tablet    Refill:  0    Follow up plan: Return in about 4 weeks (around 02/19/2024) for 4  weeks follow up L Shoulder / Foot Ulcer.   Marsa Officer, DO [facility] 01/22/2024, 9:17 AM

## 2024-01-23 DIAGNOSIS — E11628 Type 2 diabetes mellitus with other skin complications: Secondary | ICD-10-CM | POA: Diagnosis not present

## 2024-01-23 DIAGNOSIS — L03115 Cellulitis of right lower limb: Secondary | ICD-10-CM | POA: Diagnosis not present

## 2024-01-23 DIAGNOSIS — Z792 Long term (current) use of antibiotics: Secondary | ICD-10-CM | POA: Diagnosis not present

## 2024-01-23 DIAGNOSIS — Z89411 Acquired absence of right great toe: Secondary | ICD-10-CM | POA: Diagnosis not present

## 2024-01-23 DIAGNOSIS — Z955 Presence of coronary angioplasty implant and graft: Secondary | ICD-10-CM | POA: Diagnosis not present

## 2024-01-23 DIAGNOSIS — N179 Acute kidney failure, unspecified: Secondary | ICD-10-CM | POA: Diagnosis not present

## 2024-01-23 DIAGNOSIS — E1122 Type 2 diabetes mellitus with diabetic chronic kidney disease: Secondary | ICD-10-CM | POA: Diagnosis not present

## 2024-01-23 DIAGNOSIS — I2510 Atherosclerotic heart disease of native coronary artery without angina pectoris: Secondary | ICD-10-CM | POA: Diagnosis not present

## 2024-01-23 DIAGNOSIS — I509 Heart failure, unspecified: Secondary | ICD-10-CM | POA: Diagnosis not present

## 2024-01-23 DIAGNOSIS — M869 Osteomyelitis, unspecified: Secondary | ICD-10-CM | POA: Diagnosis not present

## 2024-01-23 DIAGNOSIS — E1169 Type 2 diabetes mellitus with other specified complication: Secondary | ICD-10-CM | POA: Diagnosis not present

## 2024-01-23 DIAGNOSIS — E1151 Type 2 diabetes mellitus with diabetic peripheral angiopathy without gangrene: Secondary | ICD-10-CM | POA: Diagnosis not present

## 2024-01-23 DIAGNOSIS — G8929 Other chronic pain: Secondary | ICD-10-CM | POA: Diagnosis not present

## 2024-01-23 DIAGNOSIS — I7090 Unspecified atherosclerosis: Secondary | ICD-10-CM | POA: Diagnosis not present

## 2024-01-23 DIAGNOSIS — M7751 Other enthesopathy of right foot: Secondary | ICD-10-CM | POA: Diagnosis not present

## 2024-01-23 DIAGNOSIS — Z7982 Long term (current) use of aspirin: Secondary | ICD-10-CM | POA: Diagnosis not present

## 2024-01-23 DIAGNOSIS — Z89421 Acquired absence of other right toe(s): Secondary | ICD-10-CM | POA: Diagnosis not present

## 2024-01-23 DIAGNOSIS — N183 Chronic kidney disease, stage 3 unspecified: Secondary | ICD-10-CM | POA: Diagnosis not present

## 2024-01-23 DIAGNOSIS — I739 Peripheral vascular disease, unspecified: Secondary | ICD-10-CM | POA: Diagnosis not present

## 2024-01-23 DIAGNOSIS — Z794 Long term (current) use of insulin: Secondary | ICD-10-CM | POA: Diagnosis not present

## 2024-01-23 DIAGNOSIS — I129 Hypertensive chronic kidney disease with stage 1 through stage 4 chronic kidney disease, or unspecified chronic kidney disease: Secondary | ICD-10-CM | POA: Diagnosis not present

## 2024-01-23 DIAGNOSIS — I130 Hypertensive heart and chronic kidney disease with heart failure and stage 1 through stage 4 chronic kidney disease, or unspecified chronic kidney disease: Secondary | ICD-10-CM | POA: Diagnosis not present

## 2024-01-23 DIAGNOSIS — Z882 Allergy status to sulfonamides status: Secondary | ICD-10-CM | POA: Diagnosis not present

## 2024-01-23 DIAGNOSIS — Z79891 Long term (current) use of opiate analgesic: Secondary | ICD-10-CM | POA: Diagnosis not present

## 2024-01-23 DIAGNOSIS — L02611 Cutaneous abscess of right foot: Secondary | ICD-10-CM | POA: Diagnosis not present

## 2024-01-23 DIAGNOSIS — N184 Chronic kidney disease, stage 4 (severe): Secondary | ICD-10-CM | POA: Diagnosis not present

## 2024-01-23 DIAGNOSIS — E1140 Type 2 diabetes mellitus with diabetic neuropathy, unspecified: Secondary | ICD-10-CM | POA: Diagnosis not present

## 2024-01-23 DIAGNOSIS — B9562 Methicillin resistant Staphylococcus aureus infection as the cause of diseases classified elsewhere: Secondary | ICD-10-CM | POA: Diagnosis not present

## 2024-01-23 LAB — CBC WITH DIFFERENTIAL/PLATELET
Absolute Lymphocytes: 632 {cells}/uL — ABNORMAL LOW (ref 850–3900)
Absolute Monocytes: 1201 {cells}/uL — ABNORMAL HIGH (ref 200–950)
Basophils Absolute: 16 {cells}/uL (ref 0–200)
Basophils Relative: 0.1 %
Eosinophils Absolute: 16 {cells}/uL (ref 15–500)
Eosinophils Relative: 0.1 %
HCT: 35.3 % — ABNORMAL LOW (ref 38.5–50.0)
Hemoglobin: 11.7 g/dL — ABNORMAL LOW (ref 13.2–17.1)
MCH: 33 pg (ref 27.0–33.0)
MCHC: 33.1 g/dL (ref 32.0–36.0)
MCV: 99.4 fL (ref 80.0–100.0)
MPV: 10.5 fL (ref 7.5–12.5)
Monocytes Relative: 7.6 %
Neutro Abs: 13936 {cells}/uL — ABNORMAL HIGH (ref 1500–7800)
Neutrophils Relative %: 88.2 %
Platelets: 188 Thousand/uL (ref 140–400)
RBC: 3.55 Million/uL — ABNORMAL LOW (ref 4.20–5.80)
RDW: 11.9 % (ref 11.0–15.0)
Total Lymphocyte: 4 %
WBC: 15.8 Thousand/uL — ABNORMAL HIGH (ref 3.8–10.8)

## 2024-01-23 LAB — BASIC METABOLIC PANEL WITHOUT GFR
BUN/Creatinine Ratio: 18 (calc) (ref 6–22)
BUN: 73 mg/dL — ABNORMAL HIGH (ref 7–25)
CO2: 23 mmol/L (ref 20–32)
Calcium: 8.3 mg/dL — ABNORMAL LOW (ref 8.6–10.3)
Chloride: 94 mmol/L — ABNORMAL LOW (ref 98–110)
Creat: 4.04 mg/dL — ABNORMAL HIGH (ref 0.70–1.35)
Glucose, Bld: 249 mg/dL — ABNORMAL HIGH (ref 65–99)
Potassium: 4 mmol/L (ref 3.5–5.3)
Sodium: 129 mmol/L — ABNORMAL LOW (ref 135–146)

## 2024-01-23 LAB — SEDIMENTATION RATE: Sed Rate: 91 mm/h — ABNORMAL HIGH (ref 0–20)

## 2024-01-23 LAB — C-REACTIVE PROTEIN: CRP: 211 mg/L — ABNORMAL HIGH (ref <8.0)

## 2024-01-24 DIAGNOSIS — N179 Acute kidney failure, unspecified: Secondary | ICD-10-CM | POA: Diagnosis not present

## 2024-01-24 DIAGNOSIS — E1122 Type 2 diabetes mellitus with diabetic chronic kidney disease: Secondary | ICD-10-CM | POA: Diagnosis not present

## 2024-01-24 DIAGNOSIS — L03115 Cellulitis of right lower limb: Secondary | ICD-10-CM | POA: Diagnosis not present

## 2024-01-24 DIAGNOSIS — R600 Localized edema: Secondary | ICD-10-CM | POA: Diagnosis not present

## 2024-01-24 DIAGNOSIS — N184 Chronic kidney disease, stage 4 (severe): Secondary | ICD-10-CM | POA: Diagnosis not present

## 2024-01-24 DIAGNOSIS — M799 Soft tissue disorder, unspecified: Secondary | ICD-10-CM | POA: Diagnosis not present

## 2024-01-24 DIAGNOSIS — I129 Hypertensive chronic kidney disease with stage 1 through stage 4 chronic kidney disease, or unspecified chronic kidney disease: Secondary | ICD-10-CM | POA: Diagnosis not present

## 2024-01-25 DIAGNOSIS — N184 Chronic kidney disease, stage 4 (severe): Secondary | ICD-10-CM | POA: Diagnosis not present

## 2024-01-25 DIAGNOSIS — Z794 Long term (current) use of insulin: Secondary | ICD-10-CM | POA: Diagnosis not present

## 2024-01-25 DIAGNOSIS — N179 Acute kidney failure, unspecified: Secondary | ICD-10-CM | POA: Diagnosis not present

## 2024-01-25 DIAGNOSIS — I129 Hypertensive chronic kidney disease with stage 1 through stage 4 chronic kidney disease, or unspecified chronic kidney disease: Secondary | ICD-10-CM | POA: Diagnosis not present

## 2024-01-25 DIAGNOSIS — I739 Peripheral vascular disease, unspecified: Secondary | ICD-10-CM | POA: Diagnosis not present

## 2024-01-25 DIAGNOSIS — E1122 Type 2 diabetes mellitus with diabetic chronic kidney disease: Secondary | ICD-10-CM | POA: Diagnosis not present

## 2024-01-25 DIAGNOSIS — L03115 Cellulitis of right lower limb: Secondary | ICD-10-CM | POA: Diagnosis not present

## 2024-01-26 DIAGNOSIS — E11621 Type 2 diabetes mellitus with foot ulcer: Secondary | ICD-10-CM | POA: Diagnosis not present

## 2024-01-26 DIAGNOSIS — L02611 Cutaneous abscess of right foot: Secondary | ICD-10-CM | POA: Diagnosis not present

## 2024-01-26 DIAGNOSIS — E1165 Type 2 diabetes mellitus with hyperglycemia: Secondary | ICD-10-CM | POA: Diagnosis not present

## 2024-01-26 DIAGNOSIS — I2510 Atherosclerotic heart disease of native coronary artery without angina pectoris: Secondary | ICD-10-CM | POA: Diagnosis not present

## 2024-01-26 DIAGNOSIS — E1122 Type 2 diabetes mellitus with diabetic chronic kidney disease: Secondary | ICD-10-CM | POA: Diagnosis not present

## 2024-01-26 DIAGNOSIS — N189 Chronic kidney disease, unspecified: Secondary | ICD-10-CM | POA: Diagnosis not present

## 2024-01-26 DIAGNOSIS — L97414 Non-pressure chronic ulcer of right heel and midfoot with necrosis of bone: Secondary | ICD-10-CM | POA: Diagnosis not present

## 2024-01-26 DIAGNOSIS — L03115 Cellulitis of right lower limb: Secondary | ICD-10-CM | POA: Diagnosis not present

## 2024-01-26 DIAGNOSIS — I129 Hypertensive chronic kidney disease with stage 1 through stage 4 chronic kidney disease, or unspecified chronic kidney disease: Secondary | ICD-10-CM | POA: Diagnosis not present

## 2024-01-27 DIAGNOSIS — I2510 Atherosclerotic heart disease of native coronary artery without angina pectoris: Secondary | ICD-10-CM | POA: Diagnosis not present

## 2024-01-27 DIAGNOSIS — B9562 Methicillin resistant Staphylococcus aureus infection as the cause of diseases classified elsewhere: Secondary | ICD-10-CM | POA: Diagnosis not present

## 2024-01-27 DIAGNOSIS — N189 Chronic kidney disease, unspecified: Secondary | ICD-10-CM | POA: Diagnosis not present

## 2024-01-27 DIAGNOSIS — E1122 Type 2 diabetes mellitus with diabetic chronic kidney disease: Secondary | ICD-10-CM | POA: Diagnosis not present

## 2024-01-27 DIAGNOSIS — L02611 Cutaneous abscess of right foot: Secondary | ICD-10-CM | POA: Diagnosis not present

## 2024-01-27 DIAGNOSIS — E1165 Type 2 diabetes mellitus with hyperglycemia: Secondary | ICD-10-CM | POA: Diagnosis not present

## 2024-01-27 DIAGNOSIS — L03115 Cellulitis of right lower limb: Secondary | ICD-10-CM | POA: Diagnosis not present

## 2024-01-27 DIAGNOSIS — N185 Chronic kidney disease, stage 5: Secondary | ICD-10-CM | POA: Diagnosis not present

## 2024-01-27 DIAGNOSIS — I129 Hypertensive chronic kidney disease with stage 1 through stage 4 chronic kidney disease, or unspecified chronic kidney disease: Secondary | ICD-10-CM | POA: Diagnosis not present

## 2024-01-28 DIAGNOSIS — M799 Soft tissue disorder, unspecified: Secondary | ICD-10-CM | POA: Diagnosis not present

## 2024-01-28 DIAGNOSIS — E1122 Type 2 diabetes mellitus with diabetic chronic kidney disease: Secondary | ICD-10-CM | POA: Diagnosis not present

## 2024-01-28 DIAGNOSIS — M7989 Other specified soft tissue disorders: Secondary | ICD-10-CM | POA: Diagnosis not present

## 2024-01-28 DIAGNOSIS — N185 Chronic kidney disease, stage 5: Secondary | ICD-10-CM | POA: Diagnosis not present

## 2024-01-28 DIAGNOSIS — E11621 Type 2 diabetes mellitus with foot ulcer: Secondary | ICD-10-CM | POA: Diagnosis not present

## 2024-01-28 DIAGNOSIS — L03115 Cellulitis of right lower limb: Secondary | ICD-10-CM | POA: Diagnosis not present

## 2024-01-29 DIAGNOSIS — E1122 Type 2 diabetes mellitus with diabetic chronic kidney disease: Secondary | ICD-10-CM | POA: Diagnosis not present

## 2024-01-29 DIAGNOSIS — E1169 Type 2 diabetes mellitus with other specified complication: Secondary | ICD-10-CM | POA: Diagnosis not present

## 2024-01-29 DIAGNOSIS — L02611 Cutaneous abscess of right foot: Secondary | ICD-10-CM | POA: Diagnosis not present

## 2024-01-29 DIAGNOSIS — G8918 Other acute postprocedural pain: Secondary | ICD-10-CM | POA: Diagnosis not present

## 2024-01-29 DIAGNOSIS — M86171 Other acute osteomyelitis, right ankle and foot: Secondary | ICD-10-CM | POA: Diagnosis not present

## 2024-01-29 DIAGNOSIS — N189 Chronic kidney disease, unspecified: Secondary | ICD-10-CM | POA: Diagnosis not present

## 2024-01-29 DIAGNOSIS — L03115 Cellulitis of right lower limb: Secondary | ICD-10-CM | POA: Diagnosis not present

## 2024-01-29 DIAGNOSIS — M869 Osteomyelitis, unspecified: Secondary | ICD-10-CM | POA: Diagnosis not present

## 2024-01-30 DIAGNOSIS — Z8614 Personal history of Methicillin resistant Staphylococcus aureus infection: Secondary | ICD-10-CM | POA: Diagnosis not present

## 2024-01-30 DIAGNOSIS — E1122 Type 2 diabetes mellitus with diabetic chronic kidney disease: Secondary | ICD-10-CM | POA: Diagnosis not present

## 2024-01-30 DIAGNOSIS — M86171 Other acute osteomyelitis, right ankle and foot: Secondary | ICD-10-CM | POA: Diagnosis not present

## 2024-01-30 DIAGNOSIS — L03115 Cellulitis of right lower limb: Secondary | ICD-10-CM | POA: Diagnosis not present

## 2024-01-30 DIAGNOSIS — E10621 Type 1 diabetes mellitus with foot ulcer: Secondary | ICD-10-CM | POA: Diagnosis not present

## 2024-01-30 DIAGNOSIS — E1169 Type 2 diabetes mellitus with other specified complication: Secondary | ICD-10-CM | POA: Diagnosis not present

## 2024-01-30 DIAGNOSIS — M86271 Subacute osteomyelitis, right ankle and foot: Secondary | ICD-10-CM | POA: Diagnosis not present

## 2024-01-30 DIAGNOSIS — N189 Chronic kidney disease, unspecified: Secondary | ICD-10-CM | POA: Diagnosis not present

## 2024-01-30 DIAGNOSIS — I129 Hypertensive chronic kidney disease with stage 1 through stage 4 chronic kidney disease, or unspecified chronic kidney disease: Secondary | ICD-10-CM | POA: Diagnosis not present

## 2024-01-30 DIAGNOSIS — L089 Local infection of the skin and subcutaneous tissue, unspecified: Secondary | ICD-10-CM | POA: Diagnosis not present

## 2024-01-31 DIAGNOSIS — E1122 Type 2 diabetes mellitus with diabetic chronic kidney disease: Secondary | ICD-10-CM | POA: Diagnosis not present

## 2024-01-31 DIAGNOSIS — E1169 Type 2 diabetes mellitus with other specified complication: Secondary | ICD-10-CM | POA: Diagnosis not present

## 2024-01-31 DIAGNOSIS — L03115 Cellulitis of right lower limb: Secondary | ICD-10-CM | POA: Diagnosis not present

## 2024-01-31 DIAGNOSIS — N189 Chronic kidney disease, unspecified: Secondary | ICD-10-CM | POA: Diagnosis not present

## 2024-01-31 DIAGNOSIS — M86171 Other acute osteomyelitis, right ankle and foot: Secondary | ICD-10-CM | POA: Diagnosis not present

## 2024-02-01 DIAGNOSIS — M86171 Other acute osteomyelitis, right ankle and foot: Secondary | ICD-10-CM | POA: Diagnosis not present

## 2024-02-01 DIAGNOSIS — E11621 Type 2 diabetes mellitus with foot ulcer: Secondary | ICD-10-CM | POA: Diagnosis not present

## 2024-02-01 DIAGNOSIS — L03115 Cellulitis of right lower limb: Secondary | ICD-10-CM | POA: Diagnosis not present

## 2024-02-01 DIAGNOSIS — I10 Essential (primary) hypertension: Secondary | ICD-10-CM | POA: Diagnosis not present

## 2024-02-02 DIAGNOSIS — I1310 Hypertensive heart and chronic kidney disease without heart failure, with stage 1 through stage 4 chronic kidney disease, or unspecified chronic kidney disease: Secondary | ICD-10-CM | POA: Diagnosis not present

## 2024-02-02 DIAGNOSIS — N189 Chronic kidney disease, unspecified: Secondary | ICD-10-CM | POA: Diagnosis not present

## 2024-02-02 DIAGNOSIS — L03115 Cellulitis of right lower limb: Secondary | ICD-10-CM | POA: Diagnosis not present

## 2024-02-02 DIAGNOSIS — M86171 Other acute osteomyelitis, right ankle and foot: Secondary | ICD-10-CM | POA: Diagnosis not present

## 2024-02-03 DIAGNOSIS — L03115 Cellulitis of right lower limb: Secondary | ICD-10-CM | POA: Diagnosis not present

## 2024-02-03 DIAGNOSIS — N184 Chronic kidney disease, stage 4 (severe): Secondary | ICD-10-CM | POA: Diagnosis not present

## 2024-02-03 DIAGNOSIS — M86171 Other acute osteomyelitis, right ankle and foot: Secondary | ICD-10-CM | POA: Diagnosis not present

## 2024-02-03 DIAGNOSIS — E11621 Type 2 diabetes mellitus with foot ulcer: Secondary | ICD-10-CM | POA: Diagnosis not present

## 2024-02-04 DIAGNOSIS — L03115 Cellulitis of right lower limb: Secondary | ICD-10-CM | POA: Diagnosis not present

## 2024-02-04 DIAGNOSIS — E11621 Type 2 diabetes mellitus with foot ulcer: Secondary | ICD-10-CM | POA: Diagnosis not present

## 2024-02-04 DIAGNOSIS — M86171 Other acute osteomyelitis, right ankle and foot: Secondary | ICD-10-CM | POA: Diagnosis not present

## 2024-02-04 DIAGNOSIS — I10 Essential (primary) hypertension: Secondary | ICD-10-CM | POA: Diagnosis not present

## 2024-02-04 DIAGNOSIS — Z452 Encounter for adjustment and management of vascular access device: Secondary | ICD-10-CM | POA: Diagnosis not present

## 2024-02-05 DIAGNOSIS — E1165 Type 2 diabetes mellitus with hyperglycemia: Secondary | ICD-10-CM | POA: Diagnosis not present

## 2024-02-05 DIAGNOSIS — Z794 Long term (current) use of insulin: Secondary | ICD-10-CM | POA: Diagnosis not present

## 2024-02-05 DIAGNOSIS — N179 Acute kidney failure, unspecified: Secondary | ICD-10-CM | POA: Diagnosis not present

## 2024-02-05 DIAGNOSIS — M86171 Other acute osteomyelitis, right ankle and foot: Secondary | ICD-10-CM | POA: Diagnosis not present

## 2024-02-05 DIAGNOSIS — L03115 Cellulitis of right lower limb: Secondary | ICD-10-CM | POA: Diagnosis not present

## 2024-02-06 DIAGNOSIS — E1121 Type 2 diabetes mellitus with diabetic nephropathy: Secondary | ICD-10-CM | POA: Diagnosis not present

## 2024-02-06 DIAGNOSIS — E1140 Type 2 diabetes mellitus with diabetic neuropathy, unspecified: Secondary | ICD-10-CM | POA: Diagnosis not present

## 2024-02-06 DIAGNOSIS — I739 Peripheral vascular disease, unspecified: Secondary | ICD-10-CM | POA: Diagnosis not present

## 2024-02-06 DIAGNOSIS — Z452 Encounter for adjustment and management of vascular access device: Secondary | ICD-10-CM | POA: Diagnosis not present

## 2024-02-06 DIAGNOSIS — Z794 Long term (current) use of insulin: Secondary | ICD-10-CM | POA: Diagnosis not present

## 2024-02-06 DIAGNOSIS — L03115 Cellulitis of right lower limb: Secondary | ICD-10-CM | POA: Diagnosis not present

## 2024-02-09 ENCOUNTER — Telehealth: Payer: Self-pay

## 2024-02-09 NOTE — Transitions of Care (Post Inpatient/ED Visit) (Signed)
   02/09/2024  Name: [name] MRN: [id number] DOB: [birth year]  Today's TOC FU Call Status: Today's TOC FU Call Status:: Successful TOC FU Call Completed TOC FU Call Complete Date:  (Patient states he already received a call from [facility] and declined to participate in [facility] TOC progrma - TOC RN was able to advise patient that his current PCP appointment is not a hospital follow up - advised to call and have them make it a hospital f/u) Patient's Name and Date of Birth confirmed.  Transition Care Management Follow-up Telephone Call Date of Discharge: 02/06/24 Discharge Facility: Other (Non-[facility] Facility) Name of Other (Non-[facility]) Discharge Facility: [facility] [place] Type of Discharge: Inpatient Admission Primary Inpatient Discharge Diagnosis:: Cellulitis of right foot  Shona Prow RN, CCM [facility]  VBCI-Population Health RN Care Manager [phone]

## 2024-02-19 ENCOUNTER — Ambulatory Visit: Admitting: Family Medicine

## 2024-02-19 ENCOUNTER — Encounter: Payer: Self-pay | Admitting: Family Medicine

## 2024-02-19 VITALS — BP 140/76 | HR 78 | Ht 68.0 in | Wt 180.5 lb

## 2024-02-19 DIAGNOSIS — M25512 Pain in left shoulder: Secondary | ICD-10-CM

## 2024-02-19 DIAGNOSIS — L03115 Cellulitis of right lower limb: Secondary | ICD-10-CM | POA: Diagnosis not present

## 2024-02-19 DIAGNOSIS — G8929 Other chronic pain: Secondary | ICD-10-CM | POA: Diagnosis not present

## 2024-02-19 DIAGNOSIS — M7542 Impingement syndrome of left shoulder: Secondary | ICD-10-CM | POA: Diagnosis not present

## 2024-02-19 MED ORDER — OXYCODONE-ACETAMINOPHEN 5-325 MG PO TABS: 1.0000 | ORAL_TABLET | ORAL | 0 refills | Status: AC | PRN

## 2024-02-19 MED ORDER — METHYLPREDNISOLONE ACETATE 40 MG/ML IJ SUSP
40.0000 mg | Freq: Once | INTRAMUSCULAR | Status: AC
  Administered 2024-02-19: 40 mg via INTRA_ARTICULAR

## 2024-02-19 MED ORDER — LIDOCAINE HCL (PF) 1 % IJ SOLN
4.0000 mL | Freq: Once | INTRAMUSCULAR | Status: AC
  Administered 2024-02-19: 4 mL

## 2024-02-19 NOTE — Addendum Note (Signed)
 Addended by: EDMAN MARSA PARAS on: 02/19/2024 04:35 PM   Modules accepted: Level of Service

## 2024-02-19 NOTE — Progress Notes (Signed)
 Subjective:    Patient ID: [name], male    DOB: 1961/04/26, 63 y.o.   MRN: [id number]  [name] is a 63 y.o. male presenting on 02/19/2024 for Shoulder Pain (Left)   HPI  Discussed the use of AI scribe software for clinical note transcription with the patient, who gave verbal consent to proceed.  History of Present Illness   [name] is a 63 year old male who presents for a steroid injection for left shoulder arthritis.   HOSPITAL FOLLOW-UP VISIT  Hospital/Location: [facility] Date of Admission: 01/23/24 Date of Discharge: 02/06/24 Transitions of care telephone call: [facility] on 02/07/24 [place]  Reason for Admission: Cellulitis Abscess  FOLLOW-UP  [facility] H&P and Discharge Summary have been reviewed - Patient presents today about 13 days after recent hospitalization.   Left shoulder arthralgia - Persistent left shoulder pain - Pain exacerbated by lifting - No prior shoulder injections - July x-ray demonstrated bone spurs and signs of arthritis in the left shoulder joint - Increased shoulder strain due to use of crutches for right lower extremity infection  Right foot infection - History of right foot cellulitis with abscess - Completed course of antibiotics - Underwent incision and drainage of abscess and bone biopsy - Improvement in infection - No current fevers, chills, or systemic symptoms - Keeps foot elevated to aid recovery   - Today reports overall has done well after discharge.     I have reviewed the discharge medication list, and have reconciled the current and discharge medications today.        02/28/2023    2:05 PM 07/24/2022    8:38 AM 01/21/2022   12:47 PM  Depression screen PHQ 2/9  Decreased Interest 0 1 0  Down, Depressed, Hopeless 0 0 0  PHQ - 2 Score 0 1 0  Altered sleeping 0 2   Tired, decreased energy 0 2   Change in appetite 0 0   Feeling bad or failure about yourself  0 0   Trouble  concentrating 0 0   Moving slowly or fidgety/restless 0 0   Suicidal thoughts 0 0   PHQ-9 Score 0 5   Difficult doing work/chores Not difficult at all Not difficult at all        02/28/2023    2:05 PM 07/24/2022    8:39 AM 01/04/2021    8:11 AM  GAD 7 : Generalized Anxiety Score  Nervous, Anxious, on Edge 0 0 0  Control/stop worrying 0 1 0  Worry too much - different things 0 0 0  Trouble relaxing 0 1 0  Restless 0 0 0  Easily annoyed or irritable 0 0 0  Afraid - awful might happen 0 0 0  Total GAD 7 Score 0 2 0  Anxiety Difficulty Not difficult at all Not difficult at all Not difficult at all    Social History   Tobacco Use   Smoking status: Never   Smokeless tobacco: Never  Vaping Use   Vaping status: Never Used  Substance Use Topics   Alcohol use: Yes    Comment: occassionally   Drug use: No    Review of Systems Per HPI unless specifically indicated above     Objective:    BP (!) 140/76 (BP Location: Left Arm, Patient Position: Sitting, Cuff Size: Normal)   Pulse 78   Ht 5' 8 (1.727 m)   Wt 180 lb 8 oz (81.9 kg)   SpO2  96%   BMI 27.44 kg/m   Wt Readings from Last 3 Encounters:  02/19/24 180 lb 8 oz (81.9 kg)  01/22/24 188 lb 12.8 oz (85.6 kg)  12/25/23 188 lb 3.2 oz (85.4 kg)    Physical Exam Vitals and nursing note reviewed.  Constitutional:      General: He is not in acute distress.    Appearance: He is well-developed. He is not diaphoretic.  Musculoskeletal:     Comments: L shoulder with reduced ROM forward flex and abduction. Pain on impingement testing.  Skin:    General: Skin is warm and dry.     Findings: Lesion (Right foot wrapped.) present.  Neurological:     Mental Status: He is alert.     ________________________________________________________ PROCEDURE NOTE Date: 02/19/24 LEFT Shoulder subacromial injection Discussed benefits and risks (including pain, bleeding, infection, steroid flare). Verbal consent given by  patient. Medication:  1 cc Depo-medrol  40mg  and 4 cc Lidocaine  1% without epi Time Out taken  Landmarks identified. Area cleansed with alcohol wipes. Using 21 gauge and 1, 1/2 inch needle, Left subacromial bursa space was injected (with above listed medication) via posterior approach cold spray used for superficial anesthetic. Sterile bandage placed. Patient tolerated procedure well without bleeding or paresthesias. No complications.  I have personally reviewed the radiology report   CLINICAL DATA:  Acute on chronic left shoulder pain and limited range of motion   EXAM: LEFT SHOULDER - 2+ VIEW   COMPARISON:  None Available.   FINDINGS: Chondrocalcinosis versus small fragmented spur along the upper portion of the [facility] joint. Subacromial morphology is type 2 (curved).   Mild spurring of the inferior glenoid.   No malalignment.  No fracture or acute bony findings.   IMPRESSION: 1. Mild spurring of the inferior glenoid. 2. Chondrocalcinosis versus small fragmented spur along the upper portion of the [facility] joint.     Electronically Signed   By: Ryan Salvage M.D.   On: 01/22/2024 10:12  Results for orders placed or performed in visit on 01/22/24  CBC with Differential/Platelet   Collection Time: 01/22/24  9:58 AM  Result Value Ref Range   WBC 15.8 (H) 3.8 - 10.8 Thousand/uL   RBC 3.55 (L) 4.20 - 5.80 Million/uL   Hemoglobin 11.7 (L) 13.2 - 17.1 g/dL   HCT 64.6 (L) 61.4 - 49.9 %   MCV 99.4 80.0 - 100.0 fL   MCH 33.0 27.0 - 33.0 pg   MCHC 33.1 32.0 - 36.0 g/dL   RDW 88.0 88.9 - 84.9 %   Platelets 188 140 - 400 Thousand/uL   MPV 10.5 7.5 - 12.5 fL   Neutro Abs 13,936 (H) 1,500 - 7,800 cells/uL   Absolute Lymphocytes 632 (L) 850 - 3,900 cells/uL   Absolute Monocytes 1,201 (H) 200 - 950 cells/uL   Eosinophils Absolute 16 15 - 500 cells/uL   Basophils Absolute 16 0 - 200 cells/uL   Neutrophils Relative % 88.2 %   Total Lymphocyte 4.0 %   Monocytes Relative 7.6 %    Eosinophils Relative 0.1 %   Basophils Relative 0.1 %  Sed Rate (ESR)   Collection Time: 01/22/24  9:58 AM  Result Value Ref Range   Sed Rate 91 (H) 0 - 20 mm/h  C-reactive protein   Collection Time: 01/22/24  9:58 AM  Result Value Ref Range   CRP 211.0 (H) <8.0 mg/L  Basic Metabolic Panel Without GFR   Collection Time: 01/22/24  9:58 AM  Result Value Ref Range  Glucose, Bld 249 (H) 65 - 99 mg/dL   BUN 73 (H) 7 - 25 mg/dL   Creat 5.95 (H) 9.29 - 1.35 mg/dL   BUN/Creatinine Ratio 18 6 - 22 (calc)   Sodium 129 (L) 135 - 146 mmol/L   Potassium 4.0 3.5 - 5.3 mmol/L   Chloride 94 (L) 98 - 110 mmol/L   CO2 23 20 - 32 mmol/L   Calcium  8.3 (L) 8.6 - 10.3 mg/dL      Assessment & Plan:   Problem List Items Addressed This Visit   None Visit Diagnoses       Chronic left shoulder pain    -  Primary   Relevant Medications   methylPREDNISolone  acetate (DEPO-MEDROL ) injection 40 mg (Completed)   lidocaine  (PF) (XYLOCAINE ) 1 % injection 4 mL (Completed)   oxyCODONE -acetaminophen  (PERCOCET/ROXICET) 5-325 MG tablet     Rotator cuff impingement syndrome of left shoulder       Relevant Medications   methylPREDNISolone  acetate (DEPO-MEDROL ) injection 40 mg (Completed)   lidocaine  (PF) (XYLOCAINE ) 1 % injection 4 mL (Completed)   oxyCODONE -acetaminophen  (PERCOCET/ROXICET) 5-325 MG tablet     Cellulitis of right lower extremity            Left shoulder osteoarthritis / impingement bursitis X-ray last month Chronic osteoarthritis with bone spurs causing pain and limited motion, exacerbated by lifting. - Administer corticosteroid injection to left shoulder. Tolerated well - Advise avoiding excessive activity post-injection. - Re order oxycodone  with acetaminophen  for pain. - Instruct to monitor shoulder function and report persistent or worsening symptoms. - Discuss potential need for future orthopedic intervention.  Cellulitis of right foot Recent cellulitis treated with incision,  drainage, and antibiotics. Symptoms improved, No signs of systemic infection - Advise continuation of foot elevation. - Schedule follow-up with podiatrist on August 15. - Schedule fitting for custom orthotics on August 14.  Infection disease follow up after       No orders of the defined types were placed in this encounter.   Meds ordered this encounter  Medications   methylPREDNISolone  acetate (DEPO-MEDROL ) injection 40 mg   lidocaine  (PF) (XYLOCAINE ) 1 % injection 4 mL   oxyCODONE -acetaminophen  (PERCOCET/ROXICET) 5-325 MG tablet    Sig: Take 1 tablet by mouth every 4 (four) hours as needed for up to 5 days for severe pain (pain score 7-10).    Dispense:  30 tablet    Refill:  0    Follow up plan: Return if symptoms worsen or fail to improve.    Marsa Officer, DO [facility] 02/19/2024, 11:25 AM

## 2024-02-19 NOTE — Patient Instructions (Addendum)
Thank you for coming to the office today.  You received a Left Shoulder Joint steroid injection today. - Lidocaine numbing medicine may ease the pain initially for a few hours until it wears off - As discussed, you may experience a "steroid flare" this evening or within 24-48 hours, anytime medicine is injected into an inflamed joint it can cause the pain to get worse temporarily - Everyone responds differently to these injections, it depends on the patient and the severity of the joint problem, it may provide anywhere from days to weeks, to months of relief. Ideal response is >6 months relief - Try to take it easy for next 1-2 days, avoid over activity and strain on joint (limit lifting for shoulder) - Recommend the following:   - For swelling - rest, compression sleeve / ACE wrap, elevation, and ice packs as needed for first few days   - For pain in future may use heating pad or moist heat as needed  Please schedule a Follow-up Appointment to: Return if symptoms worsen or fail to improve.  If you have any other questions or concerns, please feel free to call the office or send a message through [place]. You may also schedule an earlier appointment if necessary.  Additionally, you may be receiving a survey about your experience at our office within a few days to 1 week by e-mail or mail. We value your feedback.  [name], DO [place]

## 2024-02-27 DIAGNOSIS — Z882 Allergy status to sulfonamides status: Secondary | ICD-10-CM | POA: Diagnosis not present

## 2024-02-27 DIAGNOSIS — E1151 Type 2 diabetes mellitus with diabetic peripheral angiopathy without gangrene: Secondary | ICD-10-CM | POA: Diagnosis not present

## 2024-02-27 DIAGNOSIS — E1122 Type 2 diabetes mellitus with diabetic chronic kidney disease: Secondary | ICD-10-CM | POA: Diagnosis not present

## 2024-02-27 DIAGNOSIS — I130 Hypertensive heart and chronic kidney disease with heart failure and stage 1 through stage 4 chronic kidney disease, or unspecified chronic kidney disease: Secondary | ICD-10-CM | POA: Diagnosis not present

## 2024-02-27 DIAGNOSIS — N184 Chronic kidney disease, stage 4 (severe): Secondary | ICD-10-CM | POA: Diagnosis not present

## 2024-02-27 DIAGNOSIS — E11621 Type 2 diabetes mellitus with foot ulcer: Secondary | ICD-10-CM | POA: Diagnosis not present

## 2024-02-27 DIAGNOSIS — L97526 Non-pressure chronic ulcer of other part of left foot with bone involvement without evidence of necrosis: Secondary | ICD-10-CM | POA: Diagnosis not present

## 2024-02-27 DIAGNOSIS — E1140 Type 2 diabetes mellitus with diabetic neuropathy, unspecified: Secondary | ICD-10-CM | POA: Diagnosis not present

## 2024-02-27 DIAGNOSIS — I2510 Atherosclerotic heart disease of native coronary artery without angina pectoris: Secondary | ICD-10-CM | POA: Diagnosis not present

## 2024-02-27 DIAGNOSIS — Z9889 Other specified postprocedural states: Secondary | ICD-10-CM | POA: Diagnosis not present

## 2024-02-27 DIAGNOSIS — Z888 Allergy status to other drugs, medicaments and biological substances status: Secondary | ICD-10-CM | POA: Diagnosis not present

## 2024-02-27 DIAGNOSIS — I509 Heart failure, unspecified: Secondary | ICD-10-CM | POA: Diagnosis not present

## 2024-03-01 DIAGNOSIS — E11621 Type 2 diabetes mellitus with foot ulcer: Secondary | ICD-10-CM | POA: Diagnosis not present

## 2024-03-05 DIAGNOSIS — E11621 Type 2 diabetes mellitus with foot ulcer: Secondary | ICD-10-CM | POA: Diagnosis not present

## 2024-03-05 DIAGNOSIS — L97519 Non-pressure chronic ulcer of other part of right foot with unspecified severity: Secondary | ICD-10-CM | POA: Diagnosis not present

## 2024-03-24 DIAGNOSIS — L089 Local infection of the skin and subcutaneous tissue, unspecified: Secondary | ICD-10-CM | POA: Diagnosis not present

## 2024-03-24 DIAGNOSIS — E11628 Type 2 diabetes mellitus with other skin complications: Secondary | ICD-10-CM | POA: Diagnosis not present

## 2024-03-29 ENCOUNTER — Telehealth: Payer: Self-pay | Admitting: Family

## 2024-03-29 NOTE — Telephone Encounter (Signed)
 Called to confirm/remind patient of their appointment [facility] on 03/30/24.   Appointment:   [] Confirmed  [x] Left mess   [] No answer/No voice mail  [] VM Full/unable to leave message  [] Phone not in service  Patient reminded to bring all medications and/or complete list.  Confirmed patient has transportation. Gave directions, instructed to utilize valet parking.

## 2024-03-30 ENCOUNTER — Ambulatory Visit: Payer: Self-pay | Admitting: Family

## 2024-03-30 ENCOUNTER — Other Ambulatory Visit
Admission: RE | Admit: 2024-03-30 | Discharge: 2024-03-30 | Disposition: A | Discharge status: Home / Self Care | Source: Ambulatory Visit | Attending: Family | Admitting: Family

## 2024-03-30 ENCOUNTER — Encounter: Payer: Self-pay | Admitting: Family

## 2024-03-30 ENCOUNTER — Ambulatory Visit: Admitting: Family

## 2024-03-30 VITALS — BP 148/77 | HR 76 | Wt 185.1 lb

## 2024-03-30 DIAGNOSIS — E785 Hyperlipidemia, unspecified: Secondary | ICD-10-CM

## 2024-03-30 DIAGNOSIS — I5032 Chronic diastolic (congestive) heart failure: Secondary | ICD-10-CM

## 2024-03-30 DIAGNOSIS — I2510 Atherosclerotic heart disease of native coronary artery without angina pectoris: Secondary | ICD-10-CM | POA: Diagnosis not present

## 2024-03-30 DIAGNOSIS — E1142 Type 2 diabetes mellitus with diabetic polyneuropathy: Secondary | ICD-10-CM

## 2024-03-30 DIAGNOSIS — I10 Essential (primary) hypertension: Secondary | ICD-10-CM | POA: Diagnosis not present

## 2024-03-30 DIAGNOSIS — E1169 Type 2 diabetes mellitus with other specified complication: Secondary | ICD-10-CM | POA: Insufficient documentation

## 2024-03-30 DIAGNOSIS — J449 Chronic obstructive pulmonary disease, unspecified: Secondary | ICD-10-CM | POA: Diagnosis not present

## 2024-03-30 DIAGNOSIS — G4733 Obstructive sleep apnea (adult) (pediatric): Secondary | ICD-10-CM | POA: Diagnosis not present

## 2024-03-30 DIAGNOSIS — I5022 Chronic systolic (congestive) heart failure: Secondary | ICD-10-CM

## 2024-03-30 LAB — BASIC METABOLIC PANEL WITH GFR
Anion gap: 10 (ref 5–15)
BUN: 59 mg/dL — ABNORMAL HIGH (ref 8–23)
CO2: 21 mmol/L — ABNORMAL LOW (ref 22–32)
Calcium: 8.3 mg/dL — ABNORMAL LOW (ref 8.9–10.3)
Chloride: 102 mmol/L (ref 98–111)
Creatinine, Ser: 3.07 mg/dL — ABNORMAL HIGH (ref 0.61–1.24)
GFR, Estimated: 22 mL/min — ABNORMAL LOW (ref >60)
Glucose, Bld: 216 mg/dL — ABNORMAL HIGH (ref 70–99)
Potassium: 4.2 mmol/L (ref 3.5–5.1)
Sodium: 133 mmol/L — ABNORMAL LOW (ref 135–145)

## 2024-03-30 LAB — LIPID PANEL
Cholesterol: 122 mg/dL (ref 0–200)
HDL: 44 mg/dL (ref >40)
LDL Cholesterol: 52 mg/dL (ref 0–99)
Total CHOL/HDL Ratio: 2.8 ratio
Triglycerides: 131 mg/dL (ref <150)
VLDL: 26 mg/dL (ref 0–40)

## 2024-03-30 MED ORDER — CARVEDILOL 6.25 MG PO TABS: 6.2500 mg | ORAL_TABLET | Freq: Two times a day (BID) | ORAL | 3 refills | Status: AC

## 2024-03-30 NOTE — Patient Instructions (Addendum)
 Carvedilol  refill has been sent to your pharmacy.  Please call Dr. Charolotte office (Nephrology) to schedule a follow up appointment. [phone]  Labs done today, your results will be available in MyChart, we will contact you for abnormal readings.   Your physician has requested that you have an echocardiogram. Someone will call you to schedule this test.  Echocardiography is a painless test that uses sound waves to create images of your heart. It provides your doctor with information about the size and shape of your heart and how well your heart's chambers and valves are working. This procedure takes approximately one hour. There are no restrictions for this procedure. Please do NOT wear cologne, perfume, aftershave, or lotions (deodorant is allowed). Please arrive 15 minutes prior to your appointment time.  Please note: We ask at that you not bring children with you during ultrasound (echo/ vascular) testing. Due to room size and safety concerns, children are not allowed in the ultrasound rooms during exams. Our front office staff cannot provide observation of children in our lobby area while testing is being conducted. An adult accompanying a patient to their appointment will only be allowed in the ultrasound room at the discretion of the ultrasound technician under special circumstances. We apologize for any inconvenience.

## 2024-03-30 NOTE — Progress Notes (Signed)
 ADVANCED HF CLINIC NOTE  Primary Care: Edman Marsa PARAS, DO  Primary Cardiologist: Deatrice Cage, MD  HF provider: Cherrie Sieving, MD   Chief Complaint: shortness of breath   HPI:  [name] is a 63 y.o. male with history of hyperlipidemia, CAD w/ PCI and DES 10/2016, chronic systolic HF, DM2, HTN, CKD IV, ETOH abuse and PAD. S/p drug-eluting stent placement to the LAD 4/818 in setting of acute HF.  At that time, he was found to have severe LV dysfunction with an EF 25-30% which was felt to be out of proportion to the LAD disease.  He also has a history of alcohol abuse. F/u echo 2018 EF 40-45%.    Admitted 02/19/23 due to acute on chronic heart failure. Cardiology and nephrology consults done. IV diuresing held. Antibiotic given for foot infection.   Admitted 06/05/23 for right first metatarsal diabetic foot ulcer status postdebridement on the plantar aspect 11/22. Full-thickness ulceration plantar first metatarsal head. Started on broad-spectrum antibiotics with Flagyl cefepime and linezolid. Culture from I&D with <1+ MRSA. Contact precautions were initiated. IV antibiotics were transitioned to oral meds. Sodium bicarb was started for acidosis correction and was discontinued at discharge.   Echo 08/16/20: EF 45-50%.  There was concern about possible cardiac amyloidosis.  PYP scan was equivocal for amyloidosis  Echo 08/08/22 EF 55-60%  Echo 02/19/23: EF 50-55% with mild LVH, Grade II DD and mild/ moderate MR  PFTs 2/24: FEV1 2.44 (71%) FVC 3.62 (79%) Ratio 67% DLCO 60% c/w mild obstruction  He presents today for a HF f/u visit with a chief complaint of shortness of breath. Has wound on right foot. Chronic difficulty sleeping. Sleeping on 1-2 pillows. Not able to tolerate CPAP. Has been out of carvedilol  X 2 weeks as he needed a new RX. Has not seen nephrology since 12/24.  Per dispense history: hasn't filled carvedilol  since 07/25    Previous cardiac studies:  LHC  10/21/16:  Ost 2nd Diag to 2nd Diag lesion, 60 %stenosed. Mid Cx lesion, 50 %stenosed. Prox RCA lesion, 40 %stenosed. Mid RCA lesion, 20 %stenosed. A STENT XIENCE ALPINE RX 3.5X15 drug eluting stent was successfully placed. Mid LAD lesion, 90 %stenosed. Post intervention, there is a 0% residual stenosis.  1. Severe one-vessel coronary artery disease involving mid LAD. 2. Severely reduced LV systolic function by echo. Left ventricular angiography was not performed. LVEDP was only mildly elevated at 15 mmHg. 3. Successful angioplasty and drug-eluting stent placement to the mid LAD.  ROS: All systems negative except as listed in HPI, PMH and Problem List.  SH:  Social History   Socioeconomic History   Marital status: Married    Spouse name: Not on file   Number of children: Not on file   Years of education: Not on file   Highest education level: Not on file  Occupational History   Not on file  Tobacco Use   Smoking status: Never   Smokeless tobacco: Never  Vaping Use   Vaping status: Never Used  Substance and Sexual Activity   Alcohol use: Yes    Comment: occassionally   Drug use: No   Sexual activity: Not Currently  Other Topics Concern   Not on file  Social History Narrative   Not on file   Social Drivers of Health   Financial Resource Strain: Low Risk  (01/24/2024)   Received from [facility]   Overall Financial Resource Strain (CARDIA)    How hard is it  for you to pay for the very basics like food, housing, medical care, and heating?: Not very hard  Food Insecurity: No Food Insecurity (01/24/2024)   Received from [facility]   Hunger Vital Sign    Within the past 12 months, you worried that your food would run out before you got the money to buy more.: Never true    Within the past 12 months, the food you bought just didn't last and you didn't have money to get more.: Never true  Transportation Needs: No Transportation Needs (01/24/2024)   Received from [facility]   PRAPARE - Transportation    Lack of Transportation (Medical): No    Lack of Transportation (Non-Medical): No  Physical Activity: Not on file  Stress: Not on file  Social Connections: Not on file  Intimate Partner Violence: Not At Risk (02/20/2023)   Humiliation, Afraid, Rape, and Kick questionnaire    Fear of Current or Ex-Partner: No    Emotionally Abused: No    Physically Abused: No    Sexually Abused: No    FH:  Family History  Problem Relation Age of Onset   Hypertension Mother    Heart disease Maternal Grandfather     Past Medical History:  Diagnosis Date   Ankle pain    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 10/2016 Echo: EF 25-30%, diff HK; b. 01/2017 Echo:  EF 40-45%, GrI DD; c. 08/2017 Echo: EF 40-45%, GrI DD; d. 08/2020 Echo: EF 45-50%, glob HK. Mod asymm LVH, GrI DD, Nl RV size/fxn; e. 08/2020 PYP: equivocal for ATTR cardiac amyloid.   CKD (chronic kidney disease), stage III - IV (HCC)    Coronary artery disease    a. 10/2016 Cath/PCI: LAD 64m (3.5x15 Xience Alpine DES). No other obstructive disease.   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 10/2016 Echo: EF 25-30%, diff HK; b. 01/2017 Echo:  EF 40-45%, GrI DD; c. 08/2017 Echo: EF 40-45%, GrI DD; d. 08/2020 Echo: EF 45-50%, glob HK. Mod asymm LVH, GrI DD, Nl RV size/fxn.   Pain in both feet     Current Outpatient Medications  Medication Sig Dispense Refill   ACCU-CHEK FASTCLIX LANCETS MISC Check sugar up to 3 x daily as instructed 102 each 12   ACCU-CHEK GUIDE test strip Check blood sugar up to 3 times daily as advised 100 each 12   acetaminophen  (TYLENOL  8 HOUR) 650 MG CR tablet Take 1 tablet (650 mg total) by mouth every 8 (eight) hours as needed for pain.     aspirin  EC 81 MG tablet Take 81 mg by mouth daily.     atorvastatin  (LIPITOR) 40 MG tablet Take 1 tablet  (40 mg total) by mouth daily. 90 tablet 2   carvedilol  (COREG ) 6.25 MG tablet Take 1 tablet (6.25 mg total) by mouth 2 (two) times daily with a meal. 180 tablet 3   doxazosin  (CARDURA ) 1 MG tablet Take 1 mg by mouth daily.     gabapentin  (NEURONTIN ) 100 MG capsule TAKE 1 CAPSULE BY MOUTH TWICE A DAY 180 capsule 0   GVOKE HYPOPEN  2-PACK 1 MG/0.2ML SOAJ Inject 1 mg into the skin as needed (hypoglycemia). 1 mL 2   hydrALAZINE  (APRESOLINE ) 100 MG tablet TAKE 1 TABLET BY MOUTH TWICE A DAY 180 tablet 1   Insulin  Pen Needle (B-D  ULTRAFINE III SHORT PEN) 31G X 8 MM MISC USE TO INJECT INSULIN  NIGHTLY 100 each 6   isosorbide  mononitrate (IMDUR ) 60 MG 24 hr tablet Take 1 tablet (60 mg total) by mouth 2 (two) times daily. 180 tablet 2   Lancets Misc. MISC Use  Brand compatable to insurance and monitor to check blood sugar up to 3 times daily. ICD10 E11.9 100 each 12   LANTUS  SOLOSTAR 100 UNIT/ML Solostar Pen INJECT 40 UNITS INTO THE SKIN AT BEDTIME. 45 mL 1   No current facility-administered medications for this visit.   Vitals:   03/30/24 1020  BP: (!) 148/77  Pulse: 76  SpO2: 98%  Weight: 185 lb 2 oz (84 kg)   Wt Readings from Last 3 Encounters:  03/30/24 185 lb 2 oz (84 kg)  02/19/24 180 lb 8 oz (81.9 kg)  01/22/24 188 lb 12.8 oz (85.6 kg)   Lab Results  Component Value Date   CREATININE 4.04 (H) 01/22/2024   CREATININE 3.01 (H) 09/23/2023   CREATININE 3.32 (H) 04/09/2023   PHYSICAL EXAM:  General: Well appearing.  Cor: No JVD. Regular rhythm, rate.  Lungs: clear Abdomen: soft, nontender, nondistended. Extremities: no edema Neuro:. Affect pleasant   ECG: not done   ASSESSMENT & PLAN:  1.  Ischemic cardiomyopathy with preserved ejection fraction-  - status post LAD stenting in April 2018 - Stable NYHA II - euvolemic - weight down 4 pounds from last visit 6 months ago - Echo 08/16/20: EF 45-50%.  There was concern about possible cardiac amyloidosis. PYP 2/22 reviewed. H/CL 1.17  Negative for amyloid - Echo 08/08/22 EF 55-60%  - Echo 02/19/23: EF 50-55% with mild LVH, Grade II DD and mild/ moderate MR - will get echo updated - continue carvedilol  6.25mg  BID (refilled today). Emphasized not running out of medications - continue hydralazine  100 bid and imdur  60mg  BID - not on Entresto, farxiga or spironolactone due to CKD (eGFR 16-20) - Jardiance  was associated with generalized rash.  - BNP 02/19/23 was 1502.1  2.  Coronary artery disease-  - Status post LAD stenting in April 2018.  - saw cardiology Reggy) 06/25 - not candidate for repeat cath with CKD IV - continue ASA 81mg  daily - LHC 10/21/16:  1. Severe one-vessel coronary artery disease involving mid LAD. 2. Severely reduced LV systolic function by echo. Left ventricular angiography was not performed. LVEDP was only mildly elevated at 15 mmHg. 3. Successful angioplasty and drug-eluting stent placement to the mid LAD.   3. Hypertension-  - BP 148/77, refilling carvedilol  per above - saw PCP Athena) 08/25 - BMP 02/05/24 reviewed: sodium 135, potassium 4.2, creatinine 3.32 & GFR 20 - BMET today  4. DM2- - per PCP - A1c 02/03/24 was 7.3% - saw nephrology Geoffry) 12/24. Referral back to nephrology placed today   5. Hyperlipidemia- - continue atorvastatin  40mg   - LDL 09/23/23 was 91 - lipid panel today  6. COPD- - PFTs 2/24: FEV1 2.44 (71%) FVC 3.62 (79%) Ratio 67% DLCO 60% c/w mild obstruction  7. OSA- - sleep study done 01/20/23 and showed severe sleep apnea with REI of 46.5/ hour - continues to not want to wear CPAP. Reviewed potential negative effects of not treating his sleep apnea.    Return in 6 months, sooner if needed.   I spent 30 minutes reviewing records, interviewing/ examing patient and managing plan/ orders.    Ellouise Class, FNP-C 03/30/24

## 2024-04-09 ENCOUNTER — Ambulatory Visit
Admission: RE | Admit: 2024-04-09 | Discharge: 2024-04-09 | Disposition: A | Discharge status: Home / Self Care | Source: Ambulatory Visit | Attending: Family | Admitting: Family

## 2024-04-09 DIAGNOSIS — I110 Hypertensive heart disease with heart failure: Secondary | ICD-10-CM | POA: Insufficient documentation

## 2024-04-09 DIAGNOSIS — I5032 Chronic diastolic (congestive) heart failure: Secondary | ICD-10-CM | POA: Diagnosis not present

## 2024-04-09 DIAGNOSIS — I340 Nonrheumatic mitral (valve) insufficiency: Secondary | ICD-10-CM | POA: Diagnosis not present

## 2024-04-09 DIAGNOSIS — I2510 Atherosclerotic heart disease of native coronary artery without angina pectoris: Secondary | ICD-10-CM | POA: Insufficient documentation

## 2024-04-09 LAB — ECHOCARDIOGRAM COMPLETE
AR max vel: 2.23 cm2
AV Area VTI: 2.35 cm2
AV Area mean vel: 2.28 cm2
AV Mean grad: 3 mmHg
AV Peak grad: 6 mmHg
Ao pk vel: 1.22 m/s
Area-P 1/2: 3.39 cm2
MV VTI: 1.98 cm2
S' Lateral: 3.2 cm

## 2024-04-09 NOTE — Progress Notes (Signed)
*  PRELIMINARY RESULTS* Echocardiogram 2D Echocardiogram has been performed.  [name] 04/09/2024, 8:22 AM

## 2024-04-09 NOTE — Progress Notes (Signed)
*  PRELIMINARY RESULTS* Echocardiogram 2D Echocardiogram has been performed.  [name] 04/09/2024, 8:23 AM

## 2024-04-17 ENCOUNTER — Other Ambulatory Visit: Payer: Self-pay | Admitting: Family Medicine

## 2024-04-17 DIAGNOSIS — E1142 Type 2 diabetes mellitus with diabetic polyneuropathy: Secondary | ICD-10-CM

## 2024-04-19 NOTE — Telephone Encounter (Signed)
 Requested Prescriptions  Pending Prescriptions Disp Refills   gabapentin  (NEURONTIN ) 100 MG capsule [Pharmacy Med Name: GABAPENTIN  100 MG CAPSULE] 180 capsule 0    Sig: TAKE 1 CAPSULE BY MOUTH TWICE A DAY     Neurology: Anticonvulsants - gabapentin  Failed - 04/19/2024  2:04 PM      Failed - Cr in normal range and within 360 days    Creat  Date Value Ref Range Status  01/22/2024 4.04 (H) 0.70 - 1.35 mg/dL Final   Creatinine, Ser  Date Value Ref Range Status  03/30/2024 3.07 (H) 0.61 - 1.24 mg/dL Final   Creatinine, POC  Date Value Ref Range Status  05/15/2017 0 mg/dL Final   Creatinine, Urine  Date Value Ref Range Status  07/24/2022 25 20 - 320 mg/dL Final         Failed - Completed PHQ-2 or PHQ-9 in the last 360 days      Passed - Valid encounter within last 12 months    Recent Outpatient Visits           2 months ago Chronic left shoulder pain   [facility] Edman Marsa PARAS, DO   2 months ago Cellulitis of right lower extremity   [facility] [place], Marsa PARAS, [place]

## 2024-04-21 ENCOUNTER — Telehealth: Payer: Self-pay | Admitting: *Deleted

## 2024-04-21 DIAGNOSIS — E1142 Type 2 diabetes mellitus with diabetic polyneuropathy: Secondary | ICD-10-CM

## 2024-04-21 DIAGNOSIS — N184 Chronic kidney disease, stage 4 (severe): Secondary | ICD-10-CM

## 2024-04-21 NOTE — Progress Notes (Signed)
 Complex Care Management Note Care Guide Note  04/21/2024 Name: [name] MRN: [id number] DOB: 08-29-1960   Complex Care Management Outreach Attempts: An unsuccessful telephone outreach was attempted today to offer the patient information about available complex care management services.  Follow Up Plan:  Additional outreach attempts will be made to offer the patient complex care management information and services.   Encounter Outcome:  No Answer  [name]  [facility]  [facility], [facility] Guide  Direct Dial: [phone]  Fax [phone]

## 2024-04-23 NOTE — Progress Notes (Signed)
 Complex Care Management Note Care Guide Note  04/23/2024 Name: LYN DEEMER MRN: [id number] DOB: 14-Jun-1961   Complex Care Management Outreach Attempts: A second unsuccessful outreach was attempted today to offer the patient with information about available complex care management services.  Follow Up Plan:  Additional outreach attempts will be made to offer the patient complex care management information and services.   Encounter Outcome:  Patient Request to Call Back  [name]  [facility]  [facility], [facility] Guide  Direct Dial: [phone]  Fax [phone]

## 2024-04-26 ENCOUNTER — Other Ambulatory Visit: Payer: Self-pay

## 2024-04-26 ENCOUNTER — Other Ambulatory Visit: Payer: Self-pay | Admitting: *Deleted

## 2024-04-26 ENCOUNTER — Encounter: Payer: Self-pay | Admitting: *Deleted

## 2024-04-26 DIAGNOSIS — E1142 Type 2 diabetes mellitus with diabetic polyneuropathy: Secondary | ICD-10-CM

## 2024-04-26 NOTE — Progress Notes (Signed)
 Complex Care Management Note  Care Guide Note 04/26/2024 Name: [name] MRN: [id number] DOB: Jun 12, 1961  [name] Marc is a 63 y.o. year old male who sees Edman Marsa PARAS, DO for primary care. I reached out to [name] Marc by phone today to offer complex care management services.  [name] was given information about Complex Care Management services today including:   The Complex Care Management services include support from the care team which includes your Nurse Care Manager, Clinical Social Worker, or Pharmacist.  The Complex Care Management team is here to help remove barriers to the health concerns and goals most important to you. Complex Care Management services are voluntary, and the patient may decline or stop services at any time by request to their care team member.   Complex Care Management Consent Status: Patient agreed to services and verbal consent obtained.   Follow up plan:  Telephone appointment with complex care management team member scheduled for:  04/26/24  Encounter Outcome:  Patient Scheduled  [name]  [facility]  [facility], [facility] Guide  Direct Dial: [phone]  Fax [phone]

## 2024-04-26 NOTE — Patient Outreach (Signed)
 Complex Care Management   Visit Note  04/26/2024  Name:  [name] MRN: [id number] DOB: [birth year]  Situation: Referral received for Complex Care Management related to Diabetes with Complications and diabetic foot wound. I obtained verbal consent from Patient.  Visit completed with Patient  on the phone  Background:   Past Medical History:  Diagnosis Date   Ankle pain    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 10/2016 Echo: EF 25-30%, diff HK; b. 01/2017 Echo:  EF 40-45%, GrI DD; c. 08/2017 Echo: EF 40-45%, GrI DD; d. 08/2020 Echo: EF 45-50%, glob HK. Mod asymm LVH, GrI DD, Nl RV size/fxn; e. 08/2020 PYP: equivocal for ATTR cardiac amyloid.   CKD (chronic kidney disease), stage III - IV (HCC)    Coronary artery disease    a. 10/2016 Cath/PCI: LAD 40m (3.5x15 Xience Alpine DES). No other obstructive disease.   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 10/2016 Echo: EF 25-30%, diff HK; b. 01/2017 Echo:  EF 40-45%, GrI DD; c. 08/2017 Echo: EF 40-45%, GrI DD; d. 08/2020 Echo: EF 45-50%, glob HK. Mod asymm LVH, GrI DD, Nl RV size/fxn.   Pain in both feet     Assessment: Patient Reported Symptoms:  Cognitive Cognitive Status: Alert and oriented to person, place, and time, Normal speech and language skills, No symptoms reported Cognitive/Intellectual Conditions Management [RPT]: None reported or documented in medical history or problem list   Health Maintenance Behaviors: Annual physical exam Healing Pattern: Unsure Health Facilitated by: Rest  Neurological Neurological Review of Symptoms: No symptoms reported    HEENT HEENT Symptoms Reported: No symptoms reported      Cardiovascular Cardiovascular Symptoms Reported: No symptoms reported    Respiratory Respiratory Symptoms Reported: No symptoms reported    Endocrine Endocrine Symptoms  Reported: No symptoms reported Is patient diabetic?: Yes Is patient checking blood sugars at home?: Yes List most recent blood sugar readings, include date and time of day: 120 this morning Endocrine Self-Management Outcome: 4 (good) Endocrine Comment: denies any readings less than 70 or over 200 fasting. Infrequently has readings over 200 postprandial.  Gastrointestinal Gastrointestinal Symptoms Reported: No symptoms reported      Genitourinary Genitourinary Symptoms Reported: No symptoms reported    Integumentary Integumentary Symptoms Reported: Wound Additional Integumentary Details: abrasion lateral aspect of left foot. Applying bacitracin at home. 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 Sees podiatrist at [facility] every 2 to 3 weeks. Next visit is scheduled for 06/02/24. Is not taking any antibiotics currently. Last infectious disease appoitnment was 03/24/24. Checks feet daily. Right foot has healed. Left foot is healing. No questions or concerns at this time.  Musculoskeletal Musculoskelatal Symptoms Reviewed: No symptoms reported   Falls in the past year?: No Number of falls in past year: 1 or less Was there an injury with Fall?: No Fall Risk Category Calculator: 0 Patient Fall Risk Level: Low Fall Risk    Psychosocial Psychosocial Symptoms Reported: No symptoms reported     Quality of Family Relationships: involved, helpful, supportive Do you feel physically threatened by others?: No    04/26/2024    PHQ2-9 Depression Screening   Little interest or pleasure in doing things Not at all  Feeling  down, depressed, or hopeless Not at all  PHQ-2 - Total Score 0  Trouble falling or staying asleep, or sleeping too much    Feeling tired or having little energy    Poor appetite or overeating     Feeling bad  about yourself - or that you are a failure or have let yourself or your family down    Trouble concentrating on things, such as reading the newspaper or watching television    Moving or speaking so slowly that other people could have noticed.  Or the opposite - being so fidgety or restless that you have been moving around a lot more than usual    Thoughts that you would be better off dead, or hurting yourself in some way    PHQ2-9 Total Score    If you checked off any problems, how difficult have these problems made it for you to do your work, take care of things at home, or get along with other people    Depression Interventions/Treatment      There were no vitals filed for this visit.  Medications Reviewed Today     Reviewed by Charlsie Josette SAILOR, RN (Registered Nurse) on 04/26/24 at 1550  Med List Status: <None>   Medication Order Taking? Sig Documenting Provider Last Dose Status Informant  ACCU-CHEK FASTCLIX LANCETS MISC [id number] Yes Check sugar up to 3 x daily as instructed Edman Marsa PARAS, DO  Active Self, Spouse/Significant Other  ACCU-CHEK GUIDE test strip [id number] Yes Check blood sugar up to 3 times daily as advised Karamalegos, Marsa PARAS, DO  Active Self, Spouse/Significant Other  acetaminophen  (TYLENOL  8 HOUR) 650 MG CR tablet [id number] Yes Take 1 tablet (650 mg total) by mouth every 8 (eight) hours as needed for pain. Shirl Greig Maxwell, NP  Active Self, Spouse/Significant Other  aspirin  EC 81 MG tablet [id number] Yes Take 81 mg by mouth daily. [provider]  Active Self, Spouse/Significant Other           Med Note BONNITA, CALLIE M   Fri Oct 18, 2016  8:44 PM)    atorvastatin  (LIPITOR) 40 MG tablet [id number] Yes Take 1 tablet (40 mg total) by mouth daily. Gerard Frederick, NP  Active   carvedilol  (COREG ) 6.25 MG tablet [id number] Yes Take 1 tablet (6.25 mg total) by mouth 2 (two) times daily with a meal. [place] A, FNP  Active   doxazosin  (CARDURA ) 1 MG tablet  [id number] Yes Take 1 mg by mouth daily. [provider]  Active   gabapentin  (NEURONTIN ) 100 MG capsule [id number] Yes TAKE 1 CAPSULE BY MOUTH TWICE A DAY Karamalegos, Alexander PARAS, DO  Active   GVOKE HYPOPEN  2-PACK 1 MG/0.2ML EMMANUEL [id number] Yes Inject 1 mg into the skin as needed (hypoglycemia). Edman Marsa PARAS, DO  Active Self, Spouse/Significant Other           Med Note LUCIO ELSIE CHRISTELLA Stevan Apr 09, 2023 10:33 AM) Has on hand, has only ever used once  hydrALAZINE  (APRESOLINE ) 100 MG tablet [id number] Yes TAKE 1 TABLET BY MOUTH TWICE A DAY [place], [place] LABOR, FNP  Active   Insulin  Pen Needle (B-D ULTRAFINE III SHORT PEN) 31G X 8 MM MISC [id number] Yes USE TO INJECT INSULIN  NIGHTLY Karamalegos, Marsa PARAS, DO  Active Self, Spouse/Significant Other  isosorbide  mononitrate (IMDUR ) 60 MG 24 hr tablet [id number] Yes Take 1 tablet (60 mg total) by mouth 2 (two) times daily. Gerard Frederick, NP  Active   Lancets Misc.  MISC [id number] Yes Use  Brand compatable to insurance and monitor to check blood sugar up to 3 times daily. ICD10 E11.9 Edman Marsa PARAS, DO  Active Self, Spouse/Significant Other  LANTUS  SOLOSTAR 100 UNIT/ML Solostar Pen [id number] Yes INJECT 40 UNITS INTO THE SKIN AT BEDTIME.  Patient taking differently: Inject 20 Units into the skin at bedtime.   Edman Marsa PARAS, DO  Active             Recommendation:   Specialty provider follow-up podiatry and infectious disease at [facility]  Follow Up Plan:   Closing From:  Complex Care Management per patient's request  Josette Pellet, RN, BSN [place]  [facility] RN Care Manager Direct Dial: [phone]  Fax: [phone]

## 2024-04-26 NOTE — Patient Instructions (Signed)
 Visit Information  [name] was given information about Medicaid Managed Care team care coordination services as a part of their Healthy [facility] Medicaid benefit. Norleen CHRISTELLA Bonnetta   If you would like to schedule transportation through your Healthy [facility] plan, please call the following number at least 2 days in advance of your appointment: [phone]  For information about your ride after you set it up, call Ride Assist at [phone]. Use this number to activate a Will Call pickup, or if your transportation is late for a scheduled pickup. Use this number, too, if you need to make a change or cancel a previously scheduled reservation.  If you need transportation services right away, call [phone]. The after-hours call center is staffed 24 hours to handle ride assistance and urgent reservation requests (including discharges) 365 days a year. Urgent trips include sick visits, hospital discharge requests and life-sustaining treatment.  Call the [facility] Line at [phone], at any time, 24 hours a day, 7 days a week. If you are in danger or need immediate medical attention call 911.   Please see education materials related to Diabetes Sick Day Management provided by MyChart link.  Patient verbalizes understanding of instructions and care plan provided today and agrees to view in MyChart. Active MyChart status and patient understanding of how to access instructions and care plan via MyChart confirmed with patient.     No further follow up required: Patient declined further follow-up calls from [facility] Management  Josette Pellet, RN, BSN [place]  [facility] Population Health RN Care Manager Direct Dial: [phone]  Fax: [phone]    Following is a copy of your plan of care:   Goals Addressed             This Visit's Progress    COMPLETED: VBCI RN Care Plan: Diabetic Foot Wound   On track    Problems:  Chronic Disease Management support and  education needs related to diabetic foot wound  Goal: Over the next 90 days the Patient will demonstrate ongoing self health care management ability regarding left foot wound as evidenced by    continued wound improvement Over the next 30 days, patient will keep appointment with podiatrist Over the next 30 days, patient will reach out to provider with any new or worsening symptoms  Interventions:  Reviewed and discussed hospitalization in July for right foot cellulitis and osteomyelitis with subsequent great toe amputation Reviewed and discussed podiatry and infectious disease visits at [facility] Reviewed and discussed upcoming appointments Verified transportation to appointments Assessed SDOH needs Assessed education needs related to wound care and chronic disease management. Patient denied needing assistance Encouraged to keep appointments and to reach out to provider with any new or worsening symptoms.   Patient Self-Care Activities:  Attend all scheduled provider appointments Perform all self care activities independently  check feet daily for cuts, sores or redness  Plan:  No further follow up required: Patient declined future follow-up calls from [facility] Care Management The patient has been provided with contact information for the care management team and has been advised to call with any health related questions or concerns.

## 2024-07-14 ENCOUNTER — Other Ambulatory Visit: Payer: Self-pay | Admitting: Family Medicine

## 2024-07-14 DIAGNOSIS — E1142 Type 2 diabetes mellitus with diabetic polyneuropathy: Secondary | ICD-10-CM

## 2024-07-15 NOTE — Telephone Encounter (Signed)
 Requested Prescriptions  Pending Prescriptions Disp Refills   gabapentin  (NEURONTIN ) 100 MG capsule [Pharmacy Med Name: GABAPENTIN  100 MG CAPSULE] 180 capsule 0    Sig: TAKE 1 CAPSULE BY MOUTH TWICE A DAY     Neurology: Anticonvulsants - gabapentin  Failed - 07/15/2024  1:00 PM      Failed - Cr in normal range and within 360 days    Creat  Date Value Ref Range Status  01/22/2024 4.04 (H) 0.70 - 1.35 mg/dL Final   Creatinine, Ser  Date Value Ref Range Status  03/30/2024 3.07 (H) 0.61 - 1.24 mg/dL Final   Creatinine, POC  Date Value Ref Range Status  05/15/2017 0 mg/dL Final   Creatinine, Urine  Date Value Ref Range Status  07/24/2022 25 20 - 320 mg/dL Final         Passed - Completed PHQ-2 or PHQ-9 in the last 360 days      Passed - Valid encounter within last 12 months    Recent Outpatient Visits           4 months ago Chronic left shoulder pain   [place] [facility] Marsa PARAS, DO   5 months ago Cellulitis of right lower extremity   [facility] [place], Marsa PARAS, DO       Future Appointments             In 4 weeks Darron, Deatrice LABOR, MD [facility]

## 2024-08-12 ENCOUNTER — Ambulatory Visit: Admitting: Cardiovascular Disease

## 2024-08-12 NOTE — Progress Notes (Unsigned)
 "    Cardiology Office Note   Date:  08/12/2024   ID:  [name], DOB [birth year], MRN [id number]  PCP:  [place] Marsa PARAS, DO  Cardiologist:   Deatrice Cage, MD   No chief complaint on file.     History of Present Illness: [name] is a 64 y.o. male who presents for a follow-up visit regarding chronic systolic heart failure and coronary artery disease. He has past medical history of CAD, mixed ischemic and nonischemic cardiomyopathy, diabetes, hypertension, hyperlipidemia, and stage III chronic kidney disease.  He is status post drug-eluting stent placement to the LAD in April 2018.  At that time, he was found to have severe LV dysfunction with an EF of 25 to 30% and diffuse hypokinesis which was felt to be out of proportion to the LAD disease.  He also has a history of alcohol abuse.  A follow-up echocardiogram in July 2018 showed improved LV function with an EF of 40 to 45%.   Amlodipine  was discontinued due to leg edema.    He had previous toe amputation but no history of peripheral arterial disease.  He has chronic kidney disease with GFR around 25.[name]  He follows with nephrology.    Most recent echocardiogram in February 2022 showed an EF of 45 to 50%.  There was concern about possible cardiac amyloidosis.  PYP scan was equivocal for amyloidosis though.  He was prescribed Farxiga by nephrology but was not covered.  Jardiance  had to be stopped due to a rash.  He was hospitalized recently with acute on chronic systolic heart failure.  He improved with IV diuresis.  He was started on small dose losartan  before hospital discharge and then was switched to valsartan .  Since hospital discharge, he has been feeling very dizzy especially in the morning.  He is orthostatic today.  No chest pain.    Past Medical History:  Diagnosis Date   Ankle pain    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 10/2016 Echo: EF  25-30%, diff HK; b. 01/2017 Echo:  EF 40-45%, GrI DD; c. 08/2017 Echo: EF 40-45%, GrI DD; d. 08/2020 Echo: EF 45-50%, glob HK. Mod asymm LVH, GrI DD, Nl RV size/fxn; e. 08/2020 PYP: equivocal for ATTR cardiac amyloid.   CKD (chronic kidney disease), stage III - IV (HCC)    Coronary artery disease    a. 10/2016 Cath/PCI: LAD 32m (3.5x15 Xience Alpine DES). No other obstructive disease.   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 10/2016 Echo: EF 25-30%, diff HK; b. 01/2017 Echo:  EF 40-45%, GrI DD; c. 08/2017 Echo: EF 40-45%, GrI DD; d. 08/2020 Echo: EF 45-50%, glob HK. Mod asymm LVH, GrI DD, Nl RV size/fxn.   Pain in both feet     Past Surgical History:  Procedure Laterality Date   CARDIAC CATHETERIZATION     CORONARY STENT INTERVENTION N/A 10/21/2016   Procedure: Coronary Stent Intervention;  Surgeon: Deatrice DELENA Cage, MD;  Location: [facility] INVASIVE CV LAB;  Service: Cardiovascular;  Laterality: N/A;   LEFT HEART CATH AND CORONARY ANGIOGRAPHY N/A 10/21/2016   Procedure: Left Heart Cath and Coronary Angiography;  Surgeon: Deatrice DELENA Cage, MD;  Location: [facility] INVASIVE CV LAB;  Service: Cardiovascular;  Laterality: N/A;   TIBIA FRACTURE SURGERY Right 2002     Current Outpatient Medications  Medication  Sig Dispense Refill   ACCU-CHEK FASTCLIX LANCETS MISC Check sugar up to 3 x daily as instructed 102 each 12   ACCU-CHEK GUIDE test strip Check blood sugar up to 3 times daily as advised 100 each 12   acetaminophen  (TYLENOL  8 HOUR) 650 MG CR tablet Take 1 tablet (650 mg total) by mouth every 8 (eight) hours as needed for pain.     aspirin  EC 81 MG tablet Take 81 mg by mouth daily.     atorvastatin  (LIPITOR) 40 MG tablet Take 1 tablet (40 mg total) by mouth daily. 90 tablet 2   carvedilol  (COREG ) 6.25 MG tablet Take 1 tablet (6.25 mg total) by mouth 2 (two) times daily with a meal. 180 tablet 3   doxazosin  (CARDURA ) 1 MG  tablet Take 1 mg by mouth daily.     gabapentin  (NEURONTIN ) 100 MG capsule TAKE 1 CAPSULE BY MOUTH TWICE A DAY 180 capsule 0   GVOKE HYPOPEN  2-PACK 1 MG/0.2ML SOAJ Inject 1 mg into the skin as needed (hypoglycemia). 1 mL 2   hydrALAZINE  (APRESOLINE ) 100 MG tablet TAKE 1 TABLET BY MOUTH TWICE A DAY 180 tablet 1   Insulin  Pen Needle (B-D ULTRAFINE III SHORT PEN) 31G X 8 MM MISC USE TO INJECT INSULIN  NIGHTLY 100 each 6   isosorbide  mononitrate (IMDUR ) 60 MG 24 hr tablet Take 1 tablet (60 mg total) by mouth 2 (two) times daily. 180 tablet 2   Lancets Misc. MISC Use  Brand compatable to insurance and monitor to check blood sugar up to 3 times daily. ICD10 E11.9 100 each 12   LANTUS  SOLOSTAR 100 UNIT/ML Solostar Pen INJECT 40 UNITS INTO THE SKIN AT BEDTIME. (Patient taking differently: Inject 20 Units into the skin at bedtime.) 45 mL 1   No current facility-administered medications for this visit.    Allergies:   Sulfamethoxazole-trimethoprim, Amlodipine , Lisinopril , Jardiance  [empagliflozin ], and Terbinafine and related    Social History:  The patient  reports that he has never smoked. He has never used smokeless tobacco. He reports current alcohol use. He reports that he does not use drugs.   Family History:  The patient's family history includes Heart disease in his maternal grandfather; Hypertension in his mother.    ROS:  Please see the history of present illness.   Otherwise, review of systems are positive for none.   All other systems are reviewed and negative.    PHYSICAL EXAM: VS:  There were no vitals taken for this visit. , BMI There is no height or weight on file to calculate BMI. GEN: Well nourished, well developed, in no acute distress  HEENT: normal  Neck: No  carotid bruits, or masses.  Mild JVD Cardiac: RRR; no murmurs, rubs, or gallops, mild bilateral leg edema Respiratory:  clear to auscultation bilaterally, normal work of breathing GI: soft, nontender, nondistended, +  BS MS: no deformity or atrophy  Skin: warm and dry, no rash Neuro:  Strength and sensation are intact Psych: euthymic mood, full affect   EKG:  EKG is  ordered today. EKG showed sinus bradycardia with left bundle branch block.   Recent Labs: 01/22/2024: Hemoglobin 11.7; Platelets 188 03/30/2024: BUN 59; Creatinine, Ser 3.07; Potassium 4.2; Sodium 133    Lipid Panel    Component Value Date/Time   CHOL 122 03/30/2024 1136   CHOL 171 09/23/2023 1134   TRIG 131 03/30/2024 1136   HDL 44 03/30/2024 1136   HDL 44 09/23/2023 1134   CHOLHDL 2.8 03/30/2024 1136  VLDL 26 03/30/2024 1136   LDLCALC 52 03/30/2024 1136   LDLCALC 91 09/23/2023 1134   LDLCALC 71 01/30/2023 0857   LDLDIRECT 74 03/16/2021 1503      Wt Readings from Last 3 Encounters:  03/30/24 185 lb 2 oz (84 kg)  02/19/24 180 lb 8 oz (81.9 kg)  01/22/24 188 lb 12.8 oz (85.6 kg)          10/28/2016    3:28 PM  PAD Screen  Previous PAD dx? No  Previous surgical procedure? No  Pain with walking? No  Feet/toe relief with dangling? No  Painful, non-healing ulcers? No  Extremities discolored? No      ASSESSMENT AND PLAN:  1.  Coronary artery disease: Status post LAD stenting in April 2018.  He has done well since then without any recurrent chest pain or dyspnea.      Continue aspirin  indefinitely.  2.  Chronic systolic heart failure.  Recent echocardiogram showed an EF of 50 to 55%.  Treatment of heart failure is limited by advanced chronic kidney disease with creatinine around 4.  He seems to be tolerating a small dose of valsartan  but not sure for how long.  He is dizzy and lightheaded likely due to orthostatic dizziness and underlying bradycardia.  Thus, I am going to wean him off small dose of clonidine .  Continue other medications for now.   He appears to be euvolemic on furosemide  40 mg once daily.   3.  Essential hypertension: Blood pressure is controlled but he is orthostatic with dizziness.  In addition,  he is more bradycardic.  I am weaning him off clonidine .   4.  Hyperlipidemia: Continue treatment with atorvastatin .  Most recent lipid profile showed an LDL of 74 which is close to target.   5.  Type 2 diabetes mellitus: Most recent hemoglobin A1c was 8.9.    6.  Stage 4 chronic kidney disease: Continue close follow-up with Dr. Marcelino.  I suspect that he will require renal replacement therapy within the next 12 months.   Disposition:    FU with me in 6 months  Signed,  Deatrice Cage, MD  08/12/2024 8:12 AM    [facility] "

## 2024-08-20 ENCOUNTER — Ambulatory Visit: Admitting: Cardiology

## 2024-08-20 ENCOUNTER — Other Ambulatory Visit: Payer: Self-pay | Admitting: Family Medicine

## 2024-08-20 ENCOUNTER — Encounter: Payer: Self-pay | Admitting: Cardiology

## 2024-08-20 VITALS — BP 130/72 | HR 57 | Ht 68.0 in | Wt 199.6 lb

## 2024-08-20 DIAGNOSIS — I10 Essential (primary) hypertension: Secondary | ICD-10-CM

## 2024-08-20 DIAGNOSIS — E785 Hyperlipidemia, unspecified: Secondary | ICD-10-CM

## 2024-08-20 DIAGNOSIS — I2510 Atherosclerotic heart disease of native coronary artery without angina pectoris: Secondary | ICD-10-CM

## 2024-08-20 DIAGNOSIS — R600 Localized edema: Secondary | ICD-10-CM

## 2024-08-20 DIAGNOSIS — E1142 Type 2 diabetes mellitus with diabetic polyneuropathy: Secondary | ICD-10-CM

## 2024-08-20 DIAGNOSIS — I5020 Unspecified systolic (congestive) heart failure: Secondary | ICD-10-CM

## 2024-08-20 DIAGNOSIS — N184 Chronic kidney disease, stage 4 (severe): Secondary | ICD-10-CM

## 2024-08-20 DIAGNOSIS — E1169 Type 2 diabetes mellitus with other specified complication: Secondary | ICD-10-CM

## 2024-08-20 MED ORDER — DOXAZOSIN MESYLATE 1 MG PO TABS: 1.0000 mg | ORAL_TABLET | Freq: Every day | ORAL | 3 refills | Status: AC

## 2024-08-20 NOTE — Telephone Encounter (Signed)
 Unable to pend insulin  needles    Copied from CRM #[id number]. Topic: Clinical - Medication Refill >> Aug 20, 2024 10:38 AM Joesph B wrote: Medication: LANTUS  SOLOSTAR 100 UNIT/ML Solostar Pen [[id number]]   Insulin  Pen Needle (B-D ULTRAFINE III SHORT PEN) 31G X 8 MM MISC [[id number]]   Has the patient contacted their pharmacy? Yes (Agent: If no, request that the patient contact the pharmacy for the refill. If patient does not wish to contact the pharmacy document the reason why and proceed with request.) (Agent: If yes, when and what did the pharmacy advise?)  This is the patient's preferred pharmacy:  [id number] - [place], [place] - [address] [place] [address] Phone: [phone] Fax: [phone]  Is this the correct pharmacy for this prescription? Yes If no, delete pharmacy and type the correct one.   Has the prescription been filled recently? Yes  Is the patient out of the medication? Yes  Has the patient been seen for an appointment in the last year OR does the patient have an upcoming appointment? Yes  Can we respond through MyChart? Yes  Agent: Please be advised that Rx refills may take up to 3 business days. We ask that you follow-up with your pharmacy.

## 2024-08-20 NOTE — Progress Notes (Signed)
 " Cardiology Office Note   Date:  08/20/2024  ID:  [name], DOB 1960-12-28, MRN [id number] PCP: Edman Marsa PARAS, DO  [facility] Providers Cardiologist:  Deatrice Cage, MD Cardiology APP:  Harold Frederick, NP     History of Present Illness [name] is a 64 y.o. male with past medical history of coronary disease, chronic systolic open, type 2 diabetes, hypertension, CKD stage IV, EtOH abuse, PAD, is being seen today for follow-up.   [name] has been previously followed by advanced heart failure team.  He is status post PCI/DES to the LAD in 10/2016 in the setting of acute heart failure.  At that time he was found to have severe LV dysfunction with an EF of 25-30% which was found to be out of proportion to his LAD disease.  He also has a history of alcohol abuse.  Follow-up echocardiogram in 2018 showed an EF of 40-45%.  Echocardiogram in 2022 showed EF 45-50%.  There was concern for possible cardiac amyloidosis.  PYP scan was equivocal for amyloidosis.  Echo January 2024 showed EF 55 to 60%.  Doreen was not covered by insurance and Jardiance  was stopped due to rash.  He remains on disability.  He was seen in clinic June 2024 on Lasix  40 mg daily.  He was started on Entresto and spironolactone due to CKD stage IV.  He was on hydralazine  and Imdur .  Labs, PFTs and sleep study were ordered and recommended.  He was hospitalized at [facility] 02/19/2023 - 02/23/2023 for acute on chronic heart failure.  Echocardiogram revealed LVEF 50-55%, no RWMA, mild LVH, G2 DD, IV diuretics was held pending nephrology assessment once resume continued on furosemide  40 mg daily, serum creatinine was back down to 3.7.  He was treated for an infection to his right foot.   Admitted 06/05/2023 for right metatarsal diabetic foot ulcer status postdebridement on the plantar aspect 05/2021.  Full-thickness ulcer at 0.  Started on broad-spectrum antibiotics with Flagyl, cefepime, and linezolid.   Culture from [facility] with MRSA.  Contact precautions were initiated.  IV antibiotics were transitioned to oral meds.  Sodium bicarb was started for acidosis correction and was discontinued at discharge.  He was last seen in clinic 03/13/2023 by Dr.Arida.  He had continued to suffer from intermittent dizziness with med changes made by heart failure clinic last week.  Med changes were to stop losartan , start valsartan  and take hydralazine  twice daily.  Orthostatic blood pressures were obtained during his appointment.  Amlodipine  previously been discontinued due to leg edema.  He was found to be orthostatic when he was seen.  He was being weaned off of clonidine .   He was evaluated by advanced heart failure clinic with Harold Class, NP on 09/23/2023.  At that time unfortunately he had been out of several of his medications for approximately 1 week and needed new prescriptions.  He was continued on carvedilol , furosemide , hydralazine , Imdur , and valsartan .  Sleep study was done 01/20/2023 which revealed severe sleep apnea with an REI of 46.5/hour continues to be noncompliant with CPAP.  No other medication changes were made and and he was sent for labs.  He was last seen in clinic 12/25/2023 and he was doing well from a cardiac perspective.  Denies any chest pain, shortness of breath or peripheral edema.  There was some confusion on his current medications he brought his pills with him and needed several refills.  There were no medication changes that were made and  no further testing that was ordered at that time.   He returns to clinic today for follow-up. Pt reports doing well and denies chest pain, palpitations, shortness of breath, dyspnea on exertion, limit of daily activities, headaches, dizziness, or lightheadedness. Pt endorses some worsening lower extremity edema but states he has not been elevating them at home. Pt needs a refill on his doxazosin  mesylate 1 mg daily, but reports otherwise doing well on  medications and denies side effects. He denies recent hospitalizations or visits to the emergency department.  ROS: 10 point review of systems has been reviewed and considered negative exception was been listed in the HPI  Studies Reviewed EKG Interpretation Date/Time:  Friday August 20 2024 08:49:02 EST Ventricular Rate:  57 PR Interval:  192 QRS Duration:  160 QT Interval:  514 QTC Calculation: 500 R Axis:   -51  Text Interpretation: Sinus bradycardia Possible Left atrial enlargement Left axis deviation Left bundle branch block When compared with ECG of 25-Dec-2023 10:05, No significant change was found Confirmed by [name] ([id number]) on 08/20/2024 8:52:53 AM    2D echo 02/19/2023 1. Left ventricular ejection fraction, by estimation, is 50 to 55%. The  left ventricle has low normal function. The left ventricle has no regional  wall motion abnormalities. There is mild left ventricular hypertrophy.  Left ventricular diastolic  parameters are consistent with Grade II diastolic dysfunction  (pseudonormalization). Elevated left atrial pressure.   2. Right ventricular systolic function is normal. The right ventricular  size is mildly enlarged. Tricuspid regurgitation signal is inadequate for  assessing PA pressure.   3. Left atrial size was mildly dilated.   4. The mitral valve is degenerative. Mild to moderate mitral valve  regurgitation. No evidence of mitral stenosis.   5. The aortic valve is tricuspid. Aortic valve regurgitation is not  visualized. No aortic stenosis is present.   6. There is borderline dilatation of the ascending aorta, measuring 38  mm.   7. The inferior vena cava is normal in size with greater than 50%  respiratory variability, suggesting right atrial pressure of 3 mmHg.    Echo 07/2022  1. Left ventricular ejection fraction, by estimation, is 55 to 60%. The  left ventricle has normal function. The left ventricle has no regional  wall motion abnormalities.  There is moderate concentric left ventricular  hypertrophy. Left ventricular  diastolic parameters are consistent with Grade II diastolic dysfunction  (pseudonormalization).   2. Right ventricular systolic function is normal. The right ventricular  size is normal. There is normal pulmonary artery systolic pressure. The  estimated right ventricular systolic pressure is 12.4 mmHg.   3. Left atrial size was mildly dilated.   4. The mitral valve is normal in structure. Mild mitral valve  regurgitation. No evidence of mitral stenosis. Moderate mitral annular  calcification.   5. The aortic valve is normal in structure. Aortic valve regurgitation is  not visualized. Aortic valve sclerosis is present, with no evidence of  aortic valve stenosis.   6. The inferior vena cava is normal in size with greater than 50%  respiratory variability, suggesting right atrial pressure of 3 mmHg.    Myocardial amyloid imaging 2022 Findings equivocal for ATTR cardiac amyloid with H/CL ratio 1.1 and grade 2 tracer uptake.   Echo 08/2020  1. Left ventricular ejection fraction, by estimation, is 45 to 50%. Left  ventricular ejection fraction by 3D volume is 47 %. The left ventricle has  mildly decreased function. The left ventricle  demonstrates global  hypokinesis. There is moderate  asymmetric left ventricular hypertrophy of the septal segment. Left  ventricular diastolic parameters are consistent with Grade I diastolic  dysfunction (impaired relaxation). The average left ventricular global  longitudinal strain is -10.7 %. The global  longitudinal strain is abnormal.   2. Right ventricular systolic function is low normal. The right  ventricular size is normal.   3. The mitral valve is normal in structure. No evidence of mitral valve  regurgitation.   4. The aortic valve is tricuspid. Aortic valve regurgitation is not  visualized. Mild aortic valve sclerosis is present, with no evidence of  aortic valve  stenosis.   5. The inferior vena cava is dilated in size with >50% respiratory  variability, suggesting right atrial pressure of 8 mmHg.    Echo 08/2017 Study Conclusions  - Left ventricle: The cavity size was normal. There was mild    concentric hypertrophy. Systolic function was mildly to    moderately reduced. The estimated ejection fraction was in the    range of 40% to 45%. Hypokinesis of the septal, anteroseptal    myocardium, possibly secondary to conduction abnormality.    Hypokinesis of the anterior myocardium. Doppler parameters are    consistent with abnormal left ventricular relaxation (grade 1    diastolic dysfunction).  - Mitral valve: There was mild regurgitation.  - Left atrium: The atrium was mildly dilated.  - Right ventricle: Systolic function was normal.  - Pulmonary arteries: Systolic pressure could not be accurately    estimated.   Impressions:  - Comparwed to prior study 2018, function and regions of wall    motion are unchanged.    LHC 10/2016 Ost 2nd Diag to 2nd Diag lesion, 60 %stenosed. Mid Cx lesion, 50 %stenosed. Prox RCA lesion, 40 %stenosed. Mid RCA lesion, 20 %stenosed. A STENT XIENCE ALPINE RX [id number] drug eluting stent was successfully placed. Mid LAD lesion, 90 %stenosed. Post intervention, there is a 0% residual stenosis.   1. Severe one-vessel coronary artery disease involving mid LAD. 2. Severely reduced LV systolic function by echo. Left ventricular angiography was not performed. LVEDP was only mildly elevated at 15 mmHg. 3. Successful angioplasty and drug-eluting stent placement to the mid LAD.   Recommendations: Dual antiplatelet therapy for at least one year. Continue treatment for systolic heart failure. Resume furosemide  40 mg once daily tomorrow morning. The patient can likely be discharged home tomorrow if no complications.   Echo 2018 Study Conclusions  - Procedure narrative: Transthoracic echocardiography. Image    quality was  poor. The study was technically difficult, as a    result of poor acoustic windows and poor sound wave transmission.    Intravenous contrast (Definity ) was administered.  - Left ventricle: The cavity size was mildly dilated. Systolic    function was severely reduced. The estimated ejection fraction    was in the range of 25% to 30%. Diffuse hypokinesis.  - Mitral valve: There was mild regurgitation.  - Left atrium: The atrium was mildly dilated.   Risk Assessment/Calculations           Physical Exam VS:  BP 130/72 (BP Location: Left Arm, Patient Position: Sitting, Cuff Size: Normal)   Pulse (!) 57   Ht 5' 8 (1.727 m)   Wt 90.5 kg   SpO2 97%   BMI 30.35 kg/m        Wt Readings from Last 3 Encounters:  08/20/24 90.5 kg  03/30/24 84 kg  02/19/24 81.9  kg    GEN: Well nourished, well developed in no acute distress NECK: No JVD; No carotid bruits CARDIAC: S1 S2, RRR, no murmurs, rubs, gallops RESPIRATORY:  Clear to auscultation without rales, wheezing or rhonchi  ABDOMEN: Soft, non-tender, non-distended EXTREMITIES:  R and L LE non-pitting edema; No deformity   ASSESSMENT AND PLAN Coronary artery disease status post LAD stenting in April 2018.  He has done well without any recurrent chest pain or dyspnea.  EKG today reveals sinus bradycardia with a rate of 57 with left atrial enlargement and a chronic left bundle branch block that is unchanged.  He is continued on aspirin  81 mg daily indefinitely he is also continued on atorvastatin  40 mg daily.  No further ischemic workup needed at this time.  Chronic HFimpEF reports most recent echocardiogram in 02/2023 revealed LVEF 50-55%, no RWMA, mild LVH, G2 DD.  Treatment of heart failure with GDMT is limited by his advanced chronic kidney disease with CKD stage IV.  Pt intolerant to previously attempted SGLT2 therapy. He has also been maintained on carvedilol  6.25 mg twice daily, hydralazine  and Imdur .  He appears to be euvolemic on exam with  some non-pitting edema in his lower extremities. Pt encouraged to elevate legs at home.  Continues to suffer from NYHA a [name] II symptoms.  He has been encouraged to keep all follow-up appointments with advanced heart failure clinic.   Primary hypertension with blood pressure today 130/72.  He has been continued on carvedilol  6.25 mg twice daily,  hydralazine  100 mg twice daily, Imdur  60 mg twice daily.  He has been encouraged to continue to monitor his blood pressures 1 to 2 hours postmedication administration as well.  Mixed hyperlipidemia with his last LDL 52.  Patient to continue atorvastatin  40 mg daily.   Type 2 diabetes with last hemoglobin A1c of 8.6%.  He has been continued on insulin .  Ongoing management per his PCP.  Stage IV CKD with his last serum creatinine 3.07. Continue with close follow-up with nephrology with Dr. Lazarus.  Peripheral edema to the bilateral lower extremities.  Patient states that he has started noticing slight swelling to the bilateral lower extremities.  With his kidney function he is no longer on furosemide .  He has been encouraged participating conservative therapy of elevating his extremities, foot components, and compression stockings.       Dispo: Patient returns to clinic with MD/APP in 6 months or sooner if needed for further evaluation.   Signed, Bernarda Molt, RN   "

## 2024-08-20 NOTE — Patient Instructions (Signed)
 Medication Instructions:  Your physician recommends that you continue on your current medications as directed. Please refer to the Current Medication list given to you today.    *If you need a refill on your cardiac medications before your next appointment, please call your pharmacy*  Lab Work: No labs ordered today    Testing/Procedures: No test ordered today   Follow-Up: At [facility], you and your health needs are our priority.  As part of our continuing mission to provide you with exceptional heart care, our providers are all part of one team.  This team includes your primary Cardiologist (physician) and Advanced Practice Providers or APPs (Physician Assistants and Nurse Practitioners) who all work together to provide you with the care you need, when you need it.  Your next appointment:   6 month(s)  Provider:   Deatrice Cage, MD or Tylene Lunch, NP

## 2024-09-27 ENCOUNTER — Encounter: Admitting: Family
